# Patient Record
Sex: Male | Born: 1940
Health system: Southern US, Community
[De-identification: ages and names within clinical notes are randomized; demographics above are authoritative.]

## PROBLEM LIST (undated history)

## (undated) DIAGNOSIS — Z87442 Personal history of urinary calculi: Secondary | ICD-10-CM

## (undated) DIAGNOSIS — K219 Gastro-esophageal reflux disease without esophagitis: Secondary | ICD-10-CM

## (undated) DIAGNOSIS — E119 Type 2 diabetes mellitus without complications: Secondary | ICD-10-CM

## (undated) DIAGNOSIS — I2510 Atherosclerotic heart disease of native coronary artery without angina pectoris: Secondary | ICD-10-CM

## (undated) DIAGNOSIS — E785 Hyperlipidemia, unspecified: Secondary | ICD-10-CM

## (undated) DIAGNOSIS — D649 Anemia, unspecified: Secondary | ICD-10-CM

## (undated) DIAGNOSIS — J45909 Unspecified asthma, uncomplicated: Secondary | ICD-10-CM

## (undated) DIAGNOSIS — K5900 Constipation, unspecified: Secondary | ICD-10-CM

## (undated) DIAGNOSIS — I6529 Occlusion and stenosis of unspecified carotid artery: Secondary | ICD-10-CM

## (undated) DIAGNOSIS — N400 Enlarged prostate without lower urinary tract symptoms: Secondary | ICD-10-CM

## (undated) DIAGNOSIS — N183 Chronic kidney disease, stage 3 unspecified: Secondary | ICD-10-CM

## (undated) DIAGNOSIS — I10 Essential (primary) hypertension: Secondary | ICD-10-CM

## (undated) DIAGNOSIS — T7840XA Allergy, unspecified, initial encounter: Secondary | ICD-10-CM

## (undated) DIAGNOSIS — I5189 Other ill-defined heart diseases: Secondary | ICD-10-CM

## (undated) HISTORY — DX: Constipation, unspecified: K59.00

## (undated) HISTORY — DX: Other ill-defined heart diseases: I51.89

## (undated) HISTORY — DX: Anemia, unspecified: D64.9

## (undated) HISTORY — DX: Unspecified asthma, uncomplicated: J45.909

## (undated) HISTORY — DX: Chronic kidney disease, stage 3 unspecified: N18.30

## (undated) HISTORY — DX: Benign prostatic hyperplasia without lower urinary tract symptoms: N40.0

## (undated) HISTORY — DX: Chronic kidney disease, stage 3 (moderate): N18.3

## (undated) HISTORY — DX: Type 2 diabetes mellitus without complications: E11.9

## (undated) HISTORY — DX: Allergy, unspecified, initial encounter: T78.40XA

## (undated) HISTORY — DX: Hyperlipidemia, unspecified: E78.5

## (undated) HISTORY — DX: Atherosclerotic heart disease of native coronary artery without angina pectoris: I25.10

## (undated) HISTORY — DX: Essential (primary) hypertension: I10

## (undated) HISTORY — DX: Occlusion and stenosis of unspecified carotid artery: I65.29

## (undated) HISTORY — DX: Gastro-esophageal reflux disease without esophagitis: K21.9

---

## 1961-06-04 HISTORY — PX: VASECTOMY: SHX75

## 1993-09-22 HISTORY — PX: ANGIOPLASTY: SHX39

## 1998-06-04 HISTORY — PX: BACK SURGERY: SHX140

## 1998-11-28 ENCOUNTER — Ambulatory Visit (HOSPITAL_COMMUNITY): Admission: AD | Admit: 1998-11-28 | Discharge: 1998-11-28 | Payer: Self-pay | Admitting: Cardiovascular Disease

## 1998-11-28 HISTORY — PX: CARDIAC CATHETERIZATION: SHX172

## 1999-05-10 ENCOUNTER — Ambulatory Visit (HOSPITAL_COMMUNITY): Admission: RE | Admit: 1999-05-10 | Discharge: 1999-05-10 | Payer: Self-pay | Admitting: Neurosurgery

## 1999-05-10 ENCOUNTER — Encounter: Payer: Self-pay | Admitting: Neurosurgery

## 1999-05-24 ENCOUNTER — Encounter: Payer: Self-pay | Admitting: Neurosurgery

## 1999-05-24 ENCOUNTER — Ambulatory Visit (HOSPITAL_COMMUNITY): Admission: RE | Admit: 1999-05-24 | Discharge: 1999-05-24 | Payer: Self-pay | Admitting: Neurosurgery

## 1999-06-07 ENCOUNTER — Ambulatory Visit (HOSPITAL_COMMUNITY): Admission: RE | Admit: 1999-06-07 | Discharge: 1999-06-08 | Payer: Self-pay | Admitting: Neurosurgery

## 1999-06-07 ENCOUNTER — Encounter: Payer: Self-pay | Admitting: Neurosurgery

## 1999-06-15 ENCOUNTER — Encounter: Payer: Self-pay | Admitting: Neurosurgery

## 1999-06-16 ENCOUNTER — Encounter: Payer: Self-pay | Admitting: Neurosurgery

## 1999-06-16 ENCOUNTER — Inpatient Hospital Stay (HOSPITAL_COMMUNITY): Admission: RE | Admit: 1999-06-16 | Discharge: 1999-06-17 | Payer: Self-pay | Admitting: Neurosurgery

## 1999-07-17 ENCOUNTER — Ambulatory Visit (HOSPITAL_COMMUNITY): Admission: RE | Admit: 1999-07-17 | Discharge: 1999-07-17 | Payer: Self-pay | Admitting: Neurosurgery

## 1999-07-17 ENCOUNTER — Encounter: Payer: Self-pay | Admitting: Neurosurgery

## 1999-09-25 ENCOUNTER — Ambulatory Visit (HOSPITAL_COMMUNITY): Admission: RE | Admit: 1999-09-25 | Discharge: 1999-09-25 | Payer: Self-pay | Admitting: Neurosurgery

## 1999-09-25 ENCOUNTER — Encounter: Payer: Self-pay | Admitting: Neurosurgery

## 2004-04-12 ENCOUNTER — Ambulatory Visit: Payer: Self-pay | Admitting: Pulmonary Disease

## 2004-04-19 ENCOUNTER — Ambulatory Visit: Payer: Self-pay | Admitting: Pulmonary Disease

## 2004-08-03 ENCOUNTER — Ambulatory Visit: Payer: Self-pay | Admitting: Internal Medicine

## 2006-04-10 ENCOUNTER — Ambulatory Visit: Payer: Self-pay | Admitting: Pulmonary Disease

## 2007-04-03 ENCOUNTER — Ambulatory Visit: Payer: Self-pay | Admitting: Pulmonary Disease

## 2007-09-04 ENCOUNTER — Encounter: Payer: Self-pay | Admitting: Pulmonary Disease

## 2007-09-22 ENCOUNTER — Encounter: Payer: Self-pay | Admitting: Pulmonary Disease

## 2007-10-01 ENCOUNTER — Encounter: Payer: Self-pay | Admitting: Pulmonary Disease

## 2007-11-28 DIAGNOSIS — M810 Age-related osteoporosis without current pathological fracture: Secondary | ICD-10-CM | POA: Insufficient documentation

## 2007-11-28 DIAGNOSIS — K219 Gastro-esophageal reflux disease without esophagitis: Secondary | ICD-10-CM | POA: Insufficient documentation

## 2007-11-28 DIAGNOSIS — E785 Hyperlipidemia, unspecified: Secondary | ICD-10-CM

## 2007-11-28 DIAGNOSIS — Z794 Long term (current) use of insulin: Secondary | ICD-10-CM | POA: Insufficient documentation

## 2007-11-28 DIAGNOSIS — I2510 Atherosclerotic heart disease of native coronary artery without angina pectoris: Secondary | ICD-10-CM | POA: Insufficient documentation

## 2007-11-28 DIAGNOSIS — E119 Type 2 diabetes mellitus without complications: Secondary | ICD-10-CM | POA: Insufficient documentation

## 2007-11-28 DIAGNOSIS — E1169 Type 2 diabetes mellitus with other specified complication: Secondary | ICD-10-CM | POA: Insufficient documentation

## 2007-11-28 DIAGNOSIS — M1990 Unspecified osteoarthritis, unspecified site: Secondary | ICD-10-CM

## 2007-12-01 ENCOUNTER — Ambulatory Visit: Payer: Self-pay | Admitting: Pulmonary Disease

## 2007-12-01 DIAGNOSIS — I779 Disorder of arteries and arterioles, unspecified: Secondary | ICD-10-CM | POA: Insufficient documentation

## 2007-12-01 DIAGNOSIS — I739 Peripheral vascular disease, unspecified: Secondary | ICD-10-CM

## 2007-12-01 DIAGNOSIS — N400 Enlarged prostate without lower urinary tract symptoms: Secondary | ICD-10-CM | POA: Insufficient documentation

## 2007-12-01 DIAGNOSIS — M5137 Other intervertebral disc degeneration, lumbosacral region: Secondary | ICD-10-CM | POA: Insufficient documentation

## 2007-12-01 DIAGNOSIS — M51379 Other intervertebral disc degeneration, lumbosacral region without mention of lumbar back pain or lower extremity pain: Secondary | ICD-10-CM | POA: Insufficient documentation

## 2007-12-01 DIAGNOSIS — I10 Essential (primary) hypertension: Secondary | ICD-10-CM | POA: Insufficient documentation

## 2007-12-01 DIAGNOSIS — IMO0002 Reserved for concepts with insufficient information to code with codable children: Secondary | ICD-10-CM

## 2007-12-07 LAB — CONVERTED CEMR LAB
BUN: 15 mg/dL (ref 6–23)
CO2: 31 meq/L (ref 19–32)
Calcium: 9.1 mg/dL (ref 8.4–10.5)
Chloride: 103 meq/L (ref 96–112)
Creatinine, Ser: 0.9 mg/dL (ref 0.4–1.5)
Creatinine,U: 113.2 mg/dL
GFR calc Af Amer: 108 mL/min
GFR calc non Af Amer: 89 mL/min
Glucose, Bld: 245 mg/dL — ABNORMAL HIGH (ref 70–99)
Hgb A1c MFr Bld: 9 % — ABNORMAL HIGH (ref 4.6–6.0)
Microalb Creat Ratio: 1.8 mg/g (ref 0.0–30.0)
Microalb, Ur: 0.2 mg/dL (ref 0.0–1.9)
Potassium: 4.4 meq/L (ref 3.5–5.1)
Sodium: 139 meq/L (ref 135–145)

## 2007-12-15 ENCOUNTER — Ambulatory Visit: Payer: Self-pay | Admitting: Endocrinology

## 2007-12-23 ENCOUNTER — Telehealth (INDEPENDENT_AMBULATORY_CARE_PROVIDER_SITE_OTHER): Payer: Self-pay | Admitting: *Deleted

## 2008-01-19 ENCOUNTER — Telehealth (INDEPENDENT_AMBULATORY_CARE_PROVIDER_SITE_OTHER): Payer: Self-pay | Admitting: *Deleted

## 2008-02-20 ENCOUNTER — Telehealth (INDEPENDENT_AMBULATORY_CARE_PROVIDER_SITE_OTHER): Payer: Self-pay | Admitting: *Deleted

## 2008-03-03 ENCOUNTER — Telehealth (INDEPENDENT_AMBULATORY_CARE_PROVIDER_SITE_OTHER): Payer: Self-pay | Admitting: *Deleted

## 2008-03-15 ENCOUNTER — Ambulatory Visit: Payer: Self-pay | Admitting: Endocrinology

## 2008-03-16 LAB — CONVERTED CEMR LAB: Hgb A1c MFr Bld: 8.1 % — ABNORMAL HIGH (ref 4.6–6.0)

## 2008-03-22 ENCOUNTER — Ambulatory Visit: Payer: Self-pay | Admitting: Endocrinology

## 2008-05-25 ENCOUNTER — Telehealth (INDEPENDENT_AMBULATORY_CARE_PROVIDER_SITE_OTHER): Payer: Self-pay | Admitting: *Deleted

## 2008-06-04 HISTORY — PX: NASAL SINUS SURGERY: SHX719

## 2008-08-11 ENCOUNTER — Encounter: Payer: Self-pay | Admitting: Pulmonary Disease

## 2008-08-12 ENCOUNTER — Encounter: Payer: Self-pay | Admitting: Pulmonary Disease

## 2008-08-26 ENCOUNTER — Encounter: Payer: Self-pay | Admitting: Pulmonary Disease

## 2009-08-18 ENCOUNTER — Encounter: Payer: Self-pay | Admitting: Pulmonary Disease

## 2009-08-24 ENCOUNTER — Encounter: Payer: Self-pay | Admitting: Pulmonary Disease

## 2009-09-01 ENCOUNTER — Encounter: Payer: Self-pay | Admitting: Pulmonary Disease

## 2009-09-19 ENCOUNTER — Encounter: Payer: Self-pay | Admitting: Pulmonary Disease

## 2009-09-19 HISTORY — PX: NM MYOCAR PERF WALL MOTION: HXRAD629

## 2009-10-26 ENCOUNTER — Ambulatory Visit: Payer: Self-pay | Admitting: Pulmonary Disease

## 2010-07-04 NOTE — Progress Notes (Signed)
Summary: diabetic   Phone Note Call from Patient   Caller: [phone] Call For: dr ellsion Summary of Call: per [facility] call need a rx  for diabetic shoes . Initial call taken by: Nonah Mattes,  May 25, 2008 8:42 AM  Follow-up for Phone Call        I dont call them in.  normally a form from the place he intends to get them from is sent for [place] to fill out Follow-up by: Biagio Borg MD,  May 25, 2008 11:14 AM  Additional Follow-up for Phone Call Additional follow up Details #1::        there are specific criteria medicare uses.  i am happy to review at your next appt Additional Follow-up by: Donavan Foil MD,  May 25, 2008 10:01 PM    Additional Follow-up for Phone Call Additional follow up Details #2::    per [facility] states his  appt in feb , does he still have to wait for the rx for diabetic shoes ,need the rx before then Follow-up by: Nonah Mattes,  May 26, 2008 11:32 AM  Additional Follow-up for Phone Call Additional follow up Details #3:: Details for Additional Follow-up Action Taken: i have reviewed the chart. based on the information we have, medicare says you do not qualify Additional Follow-up by: Donavan Foil MD,  May 26, 2008 11:49 AM  per [place] call states he has been  getting this for 3 years , and he going to find another md Additional Follow-up by:  Nonah Mattes,  May 31, 2008 04 :13 PM

## 2010-07-04 NOTE — Assessment & Plan Note (Signed)
Summary: rov/fasting/apc   Primary Care Provider:  Noralee Space MD  CC:  23 month ROV & review of mult medical problems....  History of Present Illness: 70 y/o WM here for a follow up visit as he would like me to refill meds for him... he has multiple medical problems as noted below...   ~  Jun09:  he is followed regularly by DrWeintraub & [facility], we have a note from 09/04/07 and labs from 09/22/07... he is also followed at [facility]- or at least he was until recently- as he has been part of a drug study for the last 5 years that has involved his diabetes... they have had him regularly on Metformin 1000mg Bid and Glimepiride 4mg Bid as well... he has no clue what the study entailed, he does not know [name]'s, and there has apparently been no effort to titrate his meds, add additional agent, or add insulin to his regimen... we did refer him to the endocrinologists at [facility] in 2004 and he was seen by DrCalles-Escandon at that time and apparently referred to the ACCORD trial team... MrPlaster asked about seeing a diabetic specialist here- and I offered to refer him to [place]- but he states that he may consider another drug trial at [facility] and just wants refill + labs today...   ~  Oct 26, 2009:  he returns after a 65yr hiatus- still followed by [facility] w/ recent [place] (see below)... they have him on ASA, Metoprolol, Lisinopril, Dyazide, Zocor, Fish Oil... he is now under the care of DrKerr for Endocrine/ DM on Metformin, Glimepiride, Byetta...  he has had labs from both DrWeintraub & DrKerr... he sees a Dealer in [place], [place] copy w/ PSA's annually... as his medical needs are met by his numerous specialists it is suggested that he continue seeing these physicians regularly & f/u w/ [place] Prn...   Current Problem List:  HYPERTENSION (ICD-401.9) - on METOPROLOL XL 25mg - 1/2tab/d, LISINOPRIL 20mg /d, DYAZIDE- 1/2tabMWF (meds filled & adjusted by DrWeintraub)...  BP= 146/64, taking meds regularly, tolerating  well... denies HA, fatigue, visual changes, CP, palipit, dizziness, syncope, dyspnea, edema, etc...  ATHEROSCLEROTIC HEART DISEASE (ICD-414.00) - s/p DMI 1995 w/ PTCA by DrWeintraub... followed regularly on above meds + ASA 81mg /d, Flaxseed oil, Vitamins... last cath= 2000, and Nuclear Study in 2006- see DrWeintraubs note for details...  ~  2DEcho 4/11 was WNL- normal LVF, normal valves...  ~  Myoview 4/11 showed inferobasilar scar, no ischemia, EF=60%...  CEREBROVASCULAR DISEASE (ICD-437.9) - DrWeintraub has done his CDopplers w/ report of a 50-69% left ICA narrowing... we don't have any f/u data from their office...  HYPERLIPIDEMIA (ICD-272.4) - on ZOCOR 40mg /d, FishOil, and FlaxSeedOil... he tells me that DrRW does his lasts regularly.  ~  [place] 4/09 by [facility] showed TChol 128, TG 71, HDL 48, LDL 68  ~  FLP 3/11 by DrKerr showed TChol 128, TG 47, HDL 54, LDL 61  DM (ICD-250.00) - he is followed by DrKerr for Endocrine> on METFORMIN 1000mg Bid & GLIMEPIRIDE 4mg Bid... previously also on Byetta but he tells me it was stopped for nausea... labs are done regularly by DrKerr...  ~  labs 4/09 by [facility] showed BS= 189, A1c= 8.9  ~  labs 3/11 by DrKerr showed BS= 112, A1c= 6.3  ~  Ophthalmology check 4/11 [facility]- DrGreven was neg- no retinopathy, +early cataracts noted.  GERD (ICD-530.81) - uses OTC meds as needed... he had a colonoscopy 3/05 by Demetra Shiner that was normal... routine f/u planned 33yrs.  ~  NOTE: 1 brother died from ?complications after colonoscopy/ surg w/ cardiac arrest.  BENIGN PROSTATIC HYPERTROPHY, MILD, HX OF (ICD-V13.8) - he is followed by a Urologist in [place], Dr Elnoria Howard, who does his PSA's etc...   DEGENERATIVE JOINT DISEASE (ICD-715.90) DEGENERATIVE DISC DISEASE (ICD-722.6) - s/p lumbar decompression and fusion surg in 2001 by DrPool... also has signif spondylosis and foraminal narrowing in the CSpine...   Allergies (verified): No Known Drug  Allergies  Comments:  Nurse/Medical Assistant: The patient's medications and allergies were reviewed with the patient and were updated in the Medication and Allergy Lists.  Past History:  Past Medical History:  HYPERTENSION (ICD-401.9) ATHEROSCLEROTIC HEART DISEASE (ICD-414.00) CEREBROVASCULAR DISEASE (ICD-437.9) HYPERLIPIDEMIA (ICD-272.4) DM (ICD-250.00) GERD (ICD-530.81) BENIGN PROSTATIC HYPERTROPHY, MILD, HX OF (ICD-V13.8) DEGENERATIVE JOINT DISEASE (ICD-715.90) DEGENERATIVE DISC DISEASE (ICD-722.6)  Past Surgical History: S/P lumbar decompression & fusion surgery by DrPool in 2001  Family History: Reviewed history from 12/15/2007 and no changes required. 1/11 sibs have dm  Social History: Reviewed history from 12/15/2007 and no changes required. divorced retired Pharmacist, community  Review of Systems  The patient denies anorexia, fever, weight loss, weight gain, vision loss, decreased hearing, hoarseness, chest pain, syncope, dyspnea on exertion, peripheral edema, prolonged cough, headaches, hemoptysis, abdominal pain, melena, hematochezia, severe indigestion/heartburn, hematuria, incontinence, muscle weakness, suspicious skin lesions, transient blindness, difficulty walking, depression, unusual weight change, abnormal bleeding, enlarged lymph nodes, and angioedema.    Vital Signs:  Patient profile:   70 year old male Height:      70 inches Weight:      179 pounds BMI:     25.78 O2 Sat:      97 % on Room air Temp:     97.8 degrees F oral Pulse rate:   60 / minute BP sitting:   146 / 64  (left arm) Cuff size:   regular  Vitals Entered By: Elita Boone CMA (Oct 26, 2009 8:57 AM)  O2 Sat at Rest %:  97 O2 Flow:  Room air CC: 23 month ROV & review of mult medical problems... Is Patient Diabetic? Yes Pain Assessment Patient in pain? no      Comments MEDS UPDATED TODAY--PT BROUGHT ALL OF HIS MEDS TODAY   Physical Exam  Additional Exam:  WD, WN, 70 y/o WF in  [place]... GENERAL:  Alert & oriented; pleasant & cooperative... HEENT:  [place]/AT, EOM-wnl, PERRLA, EACs-clear, TMs-wnl, NOSE-clear, THROAT-clear & wnl. NECK:  Supple w/ fairROM; no JVD; normal carotid impulses w/o bruits; no thyromegaly or nodules palpated; no lymphadenopathy. CHEST:  Clear to P & A; without wheezes/ rales/ or rhonchi. HEART:  Regular Rhythm; without murmurs/ rubs/ or gallops. ABDOMEN:  Soft & nontender; normal bowel sounds; no organomegaly or masses detected. EXT: without deformities, mild arthritic changes; no varicose veins/ venous insuffic/ or edema. NEURO:  CN's intact; no focal neuro deficits...  DERM:  No lesions noted; no rash etc...    Impression & Recommendations:  Problem # 1:  HYPERTENSION (ICD-401.9) Controlled on meds and followed regularly by drWeintraub... His updated medication list for this problem includes:    Toprol Xl 25 Mg Tb24 (Metoprolol succinate) .Marland Kitchen... Take 1/2 by mouth once daily    Lisinopril 20 Mg Tabs (Lisinopril) .Marland Kitchen... Take 1 tablet by mouth once a day    Triamterene-hctz 37.5-25 Mg Tabs (Triamterene-hctz) .Marland Kitchen... 1/2 by mouth monday,wednesday and friday  Problem # 2:  ATHEROSCLEROTIC HEART DISEASE (ICD-414.00) S/P MI in 1995 & followed by DrWeintraub... recent [place] reviewed... stable. His updated  medication list for this problem includes:    Aspirin Adult Low Strength 81 Mg Tbec (Aspirin) .Marland Kitchen... Take 1 by mouth once daily    Toprol Xl 25 Mg Tb24 (Metoprolol succinate) .Marland Kitchen... Take 1/2 by mouth once daily    Lisinopril 20 Mg Tabs (Lisinopril) .Marland Kitchen... Take 1 tablet by mouth once a day    Triamterene-hctz 37.5-25 Mg Tabs (Triamterene-hctz) .Marland Kitchen... 1/2 by mouth monday,wednesday and friday  Problem # 3:  CEREBROVASCULAR DISEASE (ICD-437.9) Followed by [facility]...  Problem # 4:  HYPERLIPIDEMIA (B2193296.4) Followed by [facility] & Endocrine... His updated medication list for this problem includes:    Zocor 40 Mg Tabs (Simvastatin) .Marland Kitchen... Take 1 by  mouth once daily  Problem # 5:  DM (ICD-250.00) Followed by DrKerr... The following medications were removed from the medication list:    Actos 30 Mg Tabs (Pioglitazone hcl) ..... Qd His updated medication list for this problem includes:    Aspirin Adult Low Strength 81 Mg Tbec (Aspirin) .Marland Kitchen... Take 1 by mouth once daily    Lisinopril 20 Mg Tabs (Lisinopril) .Marland Kitchen... Take 1 tablet by mouth once a day    Metformin Hcl 1000 Mg Tabs (Metformin hcl) .Marland Kitchen... Take one tablet by mouth two times a day    Amaryl 4 Mg Tabs (Glimepiride) .Marland Kitchen... Take one tablet by mouth two times a day  Problem # 6:  BENIGN PROSTATIC HYPERTROPHY, MILD, HX OF (ICD-V13.8) Followed by Urology... he tells me that they do his PSAs (we don't have records)...  Problem # 7:  Other medical problems as noted>>>  Complete Medication List: 1)  Aspirin Adult Low Strength 81 Mg Tbec (Aspirin) .... Take 1 by mouth once daily 2)  Toprol Xl 25 Mg Tb24 (Metoprolol succinate) .... Take 1/2 by mouth once daily 3)  Lisinopril 20 Mg Tabs (Lisinopril) .... Take 1 tablet by mouth once a day 4)  Triamterene-hctz 37.5-25 Mg Tabs (Triamterene-hctz) .... 1/2 by mouth monday,wednesday and friday 5)  Zocor 40 Mg Tabs (Simvastatin) .... Take 1 by mouth once daily 6)  Fish Oil 1000 Mg Caps (Omega-3 fatty acids) .... Take 1 tablet by mouth once a day 7)  Metformin Hcl 1000 Mg Tabs (Metformin hcl) .... Take one tablet by mouth two times a day 8)  Amaryl 4 Mg Tabs (Glimepiride) .... Take one tablet by mouth two times a day 9)  Cvs Vitamin E 400 Unit Caps (Vitamin e) .... Take 1 by mouth once daily 10)  Precision Ultra Lancet Misc (Lancets) .... Bid 11)  Precision Qid Test Strp (Glucose blood) .... Bid 12)  Lifesource Precision Scale Misc (Misc. devices) 13)  Flax Seed Oil 1000 Mg Caps (Flaxseed (linseed)) .... Take 1 tablet by mouth once a day 14)  Ra Iron 27 Mg Tabs (Ferrous sulfate) .... Take 1 tablet by mouth once a day  Patient Instructions: 1)   Today we updated your med list- see below.... 2)  Continue your current meds from DrWeintraub & DrKerr... 3)  Your follow up colonoscopy from [place] is due 3/12... 4)  Call if we can be of any service to you...   Immunization History:  Influenza Immunization History:    Influenza:  historical (03/23/2009)  Pneumovax Immunization History:    Pneumovax:  historical (03/23/2009)

## 2010-07-04 NOTE — Progress Notes (Signed)
Summary: need lab results  Phone Note Call from Patient   Caller: Patient Call For: nadel Summary of Call: calling for a copy of lab results to be mail to him  Follow-up for Phone Call        called and spoke with pt. pt is requstig most recent labs be sent to his home address. i verified correct address and informed him labs would be sent in the mail.  Valrie Hart LPN  July 21, [id number] PM

## 2010-07-04 NOTE — Letter (Signed)
Summary: [facility]   Imported By: Phillis Knack 09/06/2009 14:54:42  _____________________________________________________________________  External Attachment:    Type:   Image     Comment:   External Document

## 2010-07-04 NOTE — Assessment & Plan Note (Signed)
Summary: NEW ENDO CONSULT/ NEW ONSET DM/DR NADEL/NWS   Vital Signs:  Patient Profile:   70 Years Old Male Weight:      202.2 pounds Temp:     98.2 degrees F oral Pulse rate:   62 / minute BP sitting:   158 / 72  (right arm) Cuff size:   regular  Pt. in pain?   no  Vitals Entered By: Tomma Lightning (December 15, 2007 2:26 PM)                  Referred by:  NADEL PCP:  Noralee Space MD  Chief Complaint:  NEW ENDO/ DM.  History of Present Illness: dx'ed with dm with back surgery in [id number], complicated by cad.  never required insulin.  takes several oral agents.  cbg's are high-100's-200's. say his diet and exercise are good.   says he wants to avoid insulin "unless i have to take it." symptomatically, has 2 years of moderate fatigue, and slight associated numbness of the toes.    Current Allergies: No known allergies   Past Medical History:    Reviewed history from 12/01/2007 and no changes required:              HYPERTENSION (ICD-401.9)       ATHEROSCLEROTIC HEART DISEASE (ICD-414.00)       CEREBROVASCULAR DISEASE (ICD-437.9)       HYPERLIPIDEMIA (ICD-272.4)       DM (ICD-250.00)       GERD (ICD-530.81)       BENIGN PROSTATIC HYPERTROPHY, MILD, HX OF (ICD-V13.8)       DEGENERATIVE JOINT DISEASE (ICD-715.90)       DEGENERATIVE DISC DISEASE (ICD-722.6)          Family History:    1/11 sibs have dm  Social History:    Reviewed history and no changes required:       divorced       retired Pharmacist, community   Risk Factors:  Tobacco use:  never   Review of Systems  The patient denies nausea, vomiting, hypoglycemia, tremor, anxiety, weight loss, weight gain, chest pain, syncope, dyspnea on exertion, peripheral edema, depression, and suspicious skin lesions.         denies diarrhea   Physical Exam  General:     well developed, well nourished, in no acute distress Head:     head is normocephalic eyes: no scleral icterus no periorbital swelling perrl external ears  are normal nose normal externally mouth has no lesion, including normal tongue  Neck:     no masses, thyromegaly, or abnormal cervical nodes Lungs:     clear to auscultation.  no respiratory distress  Heart:     regular rate and rhythm, S1, S2 without murmurs, rubs, gallops, or clicks Abdomen:     abdomen is soft, nontender.  no hepatosplenomegaly.   not distended.  no hernia  Msk:     no deformity or scoliosis noted with normal posture and gait Pulses:     dorsalis pedis intact bilat.  no carotid bruit  Extremities:     no deformity.  no ulcer on the feet.  feet are of normal color and temp.  has few varicosities bilaterally. trace left pedal edema and trace right pedal edema.   Neurologic:     no focal deficits, CN II-XII grossly intact with normal coordination, muscle strength and tone sensation is intact to touch on the feet  Skin:     normal texture and  temp.  no rash.  not diaphoretic  Cervical Nodes:     no significant adenopathy Psych:     alert and cooperative; normal mood and affect; normal attention span and concentration    Impression & Recommendations:  Problem # 1:  DM (ICD-250.00)  Problem # 2:  fatigue possibly due to dm  Problem # 3:  numbness possibly due to dm  Medications Added to Medication List This Visit: 1)  Januvia 100 Mg Tabs (Sitagliptin phosphate) .... Qd 2)  Welchol 625 Mg Tabs (Colesevelam hcl) .... 6 qd   Patient Instructions: 1)  we discussed the importance of diet and exercise therapy and the risk of diabetes 2)  add januvia 100/d 3)  add welchol 6x625/d 4)  i told pt he will likely require insulin soon 5)  ret 3 mos with a1c prior 250.00   Prescriptions: JANUVIA 100 MG  TABS (SITAGLIPTIN PHOSPHATE) qd  #90 x 3   Entered and Authorized by:   Donavan Foil MD   Signed by:   Donavan Foil MD on 12/15/2007   Method used:   Print then Give to Patient   RxID:   [id number] WELCHOL 625 MG  TABS (COLESEVELAM HCL) 6 qd   #540 x 3   Entered and Authorized by:   Donavan Foil MD   Signed by:   Donavan Foil MD on 12/15/2007   Method used:   Print then Give to Patient   RxID:   [id number]  ]  Appended Document: NEW ENDO CONSULT/ NEW ONSET DM/DR NADEL/NWS Labs entered/LMB

## 2010-07-04 NOTE — Letter (Signed)
Summary: [facility]opathy [facility] Ocular [facility]  Ret[facility]opathy [facility] Ocular Eval/[facility]   Imported By: Phillis Knack 09/22/2008 11:01:10  _____________________________________________________________________  External Attachment:    Type:   Image     Comment:   External Document

## 2010-07-04 NOTE — Letter (Signed)
Summary: Ophthalmic eval/[facility]  Ophthalmic eval/[facility]   Imported By: Phillis Knack 10/04/2009 13:40:09  _____________________________________________________________________  External Attachment:    Type:   Image     Comment:   External Document

## 2010-07-07 NOTE — Letter (Signed)
Summary: [facility]   Imported By: Phillis Knack 08/24/2009 12:08:16  _____________________________________________________________________  External Attachment:    Type:   Image     Comment:   External Document

## 2010-10-20 NOTE — Op Note (Signed)
[facility]  Patient:    Justin Salinas                    MRN: [id number] Proc. Date: 06/16/99 Adm. Date:  [id number] Attending:  Abran Duke                           Operative Report  PREOPERATIVE DIAGNOSIS:  Grade 1 lumbar vertebrae-5/sacral vertebrae-1 lytic spondylolisthesis with radiculopathy.  POSTOPERATIVE DIAGNOSIS:  Grade 1 lumbar vertebrae-5/sacral vertebrae-1 lytic spondylolisthesis with radiculopathy.  OPERATION:  L5 Gill procedure with bilateral L5 and S1 foraminotomies. Bilateral L5-S1 microdiskectomy.  Posterior lumbar interbody fusion utilizing tangent threaded bone dowels and local autograft.  Posterior-lateral fusion with pedicle screw instrumentation and local autologous bone grafting.  SURGEON:  Earnie Larsson, M.D.  ASSISTANT:  Marc Morgans., M.D.  ANESTHESIA:  General endotracheal anesthesia.  INDICATION:  Justin Salinas is a 70 year old male with history of back and severe right lower extremity pain, paresthesias, and weakness consistent with a right-sided L5 radiculopathy which failed conservative management.  MRI scanning demonstrates evidence of bilateral defects in his pars interarticularis with a grade 1 lytic spondylolisthesis.  The patient has what appears to be a rightward L5-S1 disc herniation within the foramen causing compression of the right-sided S1 nerve root.  The patient has failed conservative management including epidural steroid injection.  The patient presents now for decompression and fusion for hopeful relief of his symptoms.  DESCRIPTION OF PROCEDURE:  The patient was taken to the operating room and placed on the table in a supine position.  After adequate level of anesthesia achieved, the patient is positioned prone on to a Wilson frame, appropriately padded. The patients lumbar region is shaved and prepped sterilely.  A 10 blade was used to make a linear skin incision  overlying the L4/5 and S1 levels.  This was carried down sharply in the midline.  Subperiosteal dissection was performed bilaterally exposing the lamina and facet joints of L4, L5, and S1. The transverse processes and ala of the sacrum were exposed at L5 and S1.  A deep self-retaining retractor was placed.  X-rays taken of the level was confirmed.  A decompressive laminectomy was then performed using high speed drill, Kerrison, and Leksell rongeurs to remove the entire lamina of L5.  The inferior facets of L5 were also completely removed.  The rudimentary inferior facets still attached to the pedicle of L5 were then dissected free and resected using Kerrison rongeurs in the method described by Dr. Gordy Levan.  This completed a Gill procedure.  Both L5 and S1 nerve roots were widely decompressed at this point.  Microscope was brought in the field and used for microdissection of the spinal canal and underlying disc space.  Starting first on the patients right side, the epidural venous plexus was coagulated and cut.  Thecal sac was mobilized and retracted towards the midline.  Disc space was incised with a 15 blade in a rectangular fashion.  A wide disc space clean out was achieved using pituitary rongeurs, upward angle pituitary rongeurs, and Ebstein curets.  A large foraminal disc herniation was encountered, resected, and completely removed using pituitary rongeurs.  Disc space was then distracted to 9 mm.  Attention was then placed on the contralateral side.  The left side was quite unusual in that the dura was completely adherent to the  posterior longitudinal ligament.  A dissection plate could not be  made. Generally, the thecal sac and nerve root was slowly mobilized.  A small dural laceration was made at this point.  No CSF leaks.  The dura was reapproximated with single 6-0  Prolene.  The disc space was then isolated and incised with a 15 blade in rectangular fashion.  A  wide disc space clean out was again achieved on this side.  The disc space was then reamed and cut with a small ______ medical tangent chisel.  The inner space was cleaned.  An 8 by 26 mm tangent threaded bone dowel was then impacted into place at the L5-S1 level.  The distractor was removed from the contralateral side. Intraoperative fluoroscopy revealed good positioning of the bone graft at the proper operative  level with normal alignment.  Attention was then placed to the contralateral side.  Once again, the thecal sac and S1 nerve root were mobilized and retracted towards the midline.  L5 nerve root was protected above.  The disc space was then reamed and then cut with a cutting chisel.  Morcellized autograft was placed along the medial border of the contralateral wedge.  An 8 by 26 mm tangent wedge was once again impacted into place.  This was confirmed to be of good position by intraoperative fluoroscopy.  All retractor systems were removed.  There was no evidence of injury to the thecal sac or nerve roots at this part of the procedure.  The wound was then copiously irrigated with antibiotic solution.  Preparation was made for placement of pedicle instrumentation.  Using intraoperative fluoroscopy, pedicles were isolated at L5 and S1 bilaterally.  Pedicles were then probed using a pedicle awl at L5 and S1 bilaterally.  Each pedicle awl hole was found to be solidly in bone using blunt probe.  Each awl hole was then tapped using a 5.25 m screw tap under fluoroscopic guidance.  Once again, awl holes were found to be solidly within the pedicle itself without any evidence of violation.  Then under fluoroscopic guidance a 6.75 by 45 mm variable headed STRS pedicle screws were placed bilaterally.  At S1, 35 mm by 6.75 variable headed screws were placed bilaterally.  Each screw were given a final tightening and found to be solidly within bone.  A small short segment of  titanium rod was then placed over the screw heads of L5 and S1.  Locking caps were engaged.  The constructs were placed under general compression.  Final images revealed good position of the pedicle instrumentation and interbody  bone grafts.  This was done on both the AP and lateral planes.  The wound was then irrigated one final time.  The transverse processes in ala and sacrum were then decorticated.  Morcellized bone graft was placed laterally in  typical posterolateral fusion manner.  The canal was examined.  There was no evidence of compression of the L5 or S1 nerve roots bilaterally.  There is no evidence of any current CSF leakage.  Tisseal fiber and glue sealant was then placed over the dural repair just proximal to the S1 root.  This was allowed to  set.  Gelfoam was placed over the laminectomy defect.  Retraction system was removed.  The wound was then closed in layers with Vicryl sutures.  Staples were applied o the surface.  There were no apparent complications other than the small dural laceration.  The patient tolerated the procedure well and went to the recovery room postoperatively. DD:  06/16/99 TD:  06/17/99 Job: [id number]

## 2010-12-20 ENCOUNTER — Ambulatory Visit: Payer: Self-pay | Admitting: Pulmonary Disease

## 2011-07-06 HISTORY — PX: OTHER SURGICAL HISTORY: SHX169

## 2011-08-22 DIAGNOSIS — E119 Type 2 diabetes mellitus without complications: Secondary | ICD-10-CM | POA: Insufficient documentation

## 2011-10-03 HISTORY — PX: OTHER SURGICAL HISTORY: SHX169

## 2012-01-11 DIAGNOSIS — H52209 Unspecified astigmatism, unspecified eye: Secondary | ICD-10-CM | POA: Insufficient documentation

## 2012-01-11 DIAGNOSIS — H5200 Hypermetropia, unspecified eye: Secondary | ICD-10-CM | POA: Insufficient documentation

## 2012-01-11 DIAGNOSIS — H524 Presbyopia: Secondary | ICD-10-CM | POA: Insufficient documentation

## 2012-09-18 ENCOUNTER — Other Ambulatory Visit (HOSPITAL_COMMUNITY): Payer: Self-pay | Admitting: Cardiovascular Disease

## 2012-09-18 DIAGNOSIS — R0989 Other specified symptoms and signs involving the circulatory and respiratory systems: Secondary | ICD-10-CM

## 2012-09-18 DIAGNOSIS — R011 Cardiac murmur, unspecified: Secondary | ICD-10-CM

## 2012-09-18 DIAGNOSIS — I701 Atherosclerosis of renal artery: Secondary | ICD-10-CM

## 2012-10-20 ENCOUNTER — Ambulatory Visit (HOSPITAL_COMMUNITY)
Admission: RE | Admit: 2012-10-20 | Discharge: 2012-10-20 | Disposition: A | Discharge status: Home / Self Care | Payer: Medicare Other | Source: Ambulatory Visit | Attending: Cardiovascular Disease | Admitting: Cardiovascular Disease

## 2012-10-20 DIAGNOSIS — R0989 Other specified symptoms and signs involving the circulatory and respiratory systems: Secondary | ICD-10-CM

## 2012-10-20 DIAGNOSIS — R011 Cardiac murmur, unspecified: Secondary | ICD-10-CM | POA: Insufficient documentation

## 2012-10-20 DIAGNOSIS — I701 Atherosclerosis of renal artery: Secondary | ICD-10-CM | POA: Insufficient documentation

## 2012-10-20 DIAGNOSIS — I379 Nonrheumatic pulmonary valve disorder, unspecified: Secondary | ICD-10-CM | POA: Insufficient documentation

## 2012-10-20 DIAGNOSIS — I517 Cardiomegaly: Secondary | ICD-10-CM | POA: Insufficient documentation

## 2012-10-20 DIAGNOSIS — E119 Type 2 diabetes mellitus without complications: Secondary | ICD-10-CM | POA: Insufficient documentation

## 2012-10-20 DIAGNOSIS — I2510 Atherosclerotic heart disease of native coronary artery without angina pectoris: Secondary | ICD-10-CM | POA: Insufficient documentation

## 2012-10-20 DIAGNOSIS — I10 Essential (primary) hypertension: Secondary | ICD-10-CM | POA: Insufficient documentation

## 2012-10-20 DIAGNOSIS — I059 Rheumatic mitral valve disease, unspecified: Secondary | ICD-10-CM | POA: Insufficient documentation

## 2012-10-20 HISTORY — PX: TRANSTHORACIC ECHOCARDIOGRAM: SHX275

## 2012-10-20 NOTE — Progress Notes (Signed)
2D Echo Performed 10/20/2012    Justin Salinas, RCS

## 2012-10-20 NOTE — Progress Notes (Signed)
Carotid Duplex Completed. Baptiste Littler D  

## 2012-10-20 NOTE — Progress Notes (Signed)
Renal Artery Duplex Completed. [name]

## 2012-11-19 ENCOUNTER — Telehealth: Payer: Self-pay | Admitting: Cardiovascular Disease

## 2012-11-19 NOTE — Telephone Encounter (Signed)
Message forwarded to [facility]. Tressie Stalker, LPN to discuss w/ Dr. Rollene Fare.  This note and paper chart# [id number] placed on Dr. Lowella Fairy cart.

## 2012-11-19 NOTE — Telephone Encounter (Signed)
Pharmacist said you had not got back to them-been waiting since the week-end-please call his heart medicine in-she did not know which one-didn;t know the name of it-Please call to [phone]!

## 2012-11-19 NOTE — Telephone Encounter (Signed)
Called pt. Back; family member stated the call here today was a mistake that they meant to call Dr. Cindra Eves office.

## 2012-11-19 NOTE — Telephone Encounter (Signed)
Pt called back-said the name of his medicine is Metformin!Thats the way she spelled it!

## 2013-02-12 ENCOUNTER — Ambulatory Visit (INDEPENDENT_AMBULATORY_CARE_PROVIDER_SITE_OTHER): Payer: Medicare Other | Admitting: Cardiology

## 2013-02-12 ENCOUNTER — Encounter: Payer: Self-pay | Admitting: Cardiology

## 2013-02-12 VITALS — BP 90/60 | Ht 70.0 in | Wt 171.8 lb

## 2013-02-12 DIAGNOSIS — I2510 Atherosclerotic heart disease of native coronary artery without angina pectoris: Secondary | ICD-10-CM

## 2013-02-12 DIAGNOSIS — R079 Chest pain, unspecified: Secondary | ICD-10-CM

## 2013-02-12 DIAGNOSIS — I10 Essential (primary) hypertension: Secondary | ICD-10-CM

## 2013-02-12 DIAGNOSIS — N183 Chronic kidney disease, stage 3 unspecified: Secondary | ICD-10-CM

## 2013-02-12 MED ORDER — NITROGLYCERIN 0.4 MG SL SUBL: 0.4000 mg | SUBLINGUAL_TABLET | SUBLINGUAL | Status: DC | PRN

## 2013-02-12 MED ORDER — LISINOPRIL 20 MG PO TABS: 20.0000 mg | ORAL_TABLET | Freq: Every day | ORAL | Status: DC

## 2013-02-12 MED ORDER — OMEPRAZOLE 20 MG PO CPDR: 20.0000 mg | DELAYED_RELEASE_CAPSULE | Freq: Every day | ORAL | Status: DC

## 2013-02-12 NOTE — Assessment & Plan Note (Signed)
B/P is low today- 90 systolic. Nl renals at cath in 2000 but abnormal renal dopplers 5/14

## 2013-02-12 NOTE — Assessment & Plan Note (Signed)
SCr 1.47 12/13

## 2013-02-12 NOTE — Patient Instructions (Signed)
Your physician has requested that you have en exercise stress myoview. For further information please visit [email or web address]. Please follow instruction sheet, as given.   

## 2013-02-12 NOTE — Assessment & Plan Note (Signed)
"  Indigestion" after walking and eating breakfast

## 2013-02-12 NOTE — Assessment & Plan Note (Signed)
Low risk Myoview 4/11

## 2013-02-12 NOTE — Progress Notes (Signed)
02/12/2013 Justin Salinas   [birth year]  [id number]  Primary Physicia Salinas, Justin Graff, MD Primary Cardiologist: Dr Justin Salinas  HPI:  72 y/o with a history of RCA PCI in '95. This was later noted  Be occluded in 2000. At that time he also had moderate Lt system disease but he has done well on medical Rx. He walks for exercise for about an hour a day. Today he went for his walk, came home for breakfast, and then had "indigestion". He took an Copywriter, advertising with relief. His wife was concerned and took him to his primary care provider who then sent him on to [place]. He is pain free now. He denies any jaw pain, arm pain, or diaphoresis.   Current Outpatient Prescriptions  Medication Sig Dispense Refill  . aspirin 81 MG tablet Take 81 mg by mouth daily.      . Flaxseed, Linseed, (FLAX SEEDS PO) Take 1,200 mg by mouth.      Marland Kitchen glimepiride (AMARYL) 4 MG tablet Take 4 mg by mouth 2 (two) times daily.      . metFORMIN (GLUCOPHAGE) 1000 MG tablet Take 1,000 mg by mouth 2 (two) times daily with a meal.      . Multiple Vitamins-Minerals (CENTRUM SILVER ADULT 50+ PO) Take by mouth.      . Omega-3 Fatty Acids (FISH OIL) 1000 MG CAPS Take by mouth.      . simvastatin (ZOCOR) 40 MG tablet Take 40 mg by mouth every evening.      . triamterene-hydrochlorothiazide (DYAZIDE) 37.5-25 MG per capsule Take by mouth every morning. Takes 1/2 tablet on mon, wed, fri      . vitamin B-12 (CYANOCOBALAMIN) 100 MCG tablet Take 50 mcg by mouth daily.       No current facility-administered medications for this visit.    No Known Allergies  History   Social History  . Marital Status: Married    Spouse Name: N/A    Number of Children: N/A  . Years of Education: N/A   Occupational History  . Not on file.   Social History Main Topics  . Smoking status: Former Smoker    Types: Cigarettes  . Smokeless tobacco: Former Systems developer    Types: Snuff, Chew     Comment: quit about 40 years  . Alcohol Use: 1.5 oz/week    3 drink(s)  per week  . Drug Use: Not on file  . Sexual Activity: Not on file   Other Topics Concern  . Not on file   Social History Narrative  . No narrative on file     Review of Systems: General: negative for chills, fever, night sweats or weight changes.  Cardiovascular: negative for chest pain, dyspnea on exertion, edema, orthopnea, palpitations, paroxysmal nocturnal dyspnea or shortness of breath Dermatological: negative for rash Respiratory: negative for cough or wheezing Urologic: negative for hematuria Abdominal: negative for nausea, vomiting, diarrhea, bright red blood per rectum, melena, or hematemesis Neurologic: negative for visual changes, syncope, or dizziness All other systems reviewed and are otherwise negative except as noted above.    Blood pressure 90/60, height 5\' 10"  (1.778 m), weight 171 lb 12.8 oz (77.928 kg).  General appearance: alert, cooperative and no distress Neck: no JVD and bilat carotid bruits Lungs: clear to auscultation bilaterally Heart: regular rate and rhythm  EKG not done  ASSESSMENT AND PLAN:   Chest pain with moderate risk for cardiac etiology "Indigestion" after walking and eating breakfast  HYPERTENSION B/P is low today-  90 systolic. Nl renals at cath in 2000 but abnormal renal dopplers 5/14  Chronic renal insufficiency, stage III (moderate) SCr 1.47 12/13  CAD, RCA PCI '95, occluded RCA 2000 with moderate Lt system disease. Myovie low risk 4/11 Low risk Myoview 4/11    PLAN  I wanted to set  set Mr Justin Salinas up for an exercise Myoview. Unfortunately he had an EKG at Dr Inocente Salles office that we could not get faxed. The pt did not want another one and got upset and left.  I suggested he decrease his Prinivil to 20 mg a day from 60 mg a day and start a PPI and I gave him an Rx for NTG prn. It interesting to note that he has bilat 60-99% renal artery stenosis by doppler but reportedly normal renal arteries at cath in 2000.  [facility]  KPA-C 02/12/2013 3:39 PM

## 2013-02-13 ENCOUNTER — Encounter: Payer: Self-pay | Admitting: Cardiovascular Disease

## 2013-02-20 ENCOUNTER — Telehealth (HOSPITAL_COMMUNITY): Payer: Self-pay | Admitting: *Deleted

## 2013-02-24 ENCOUNTER — Inpatient Hospital Stay (HOSPITAL_COMMUNITY): Admission: RE | Admit: 2013-02-24 | Payer: Medicare Other | Source: Ambulatory Visit

## 2013-03-02 ENCOUNTER — Telehealth (HOSPITAL_COMMUNITY): Payer: Self-pay | Admitting: *Deleted

## 2013-03-06 ENCOUNTER — Telehealth (HOSPITAL_COMMUNITY): Payer: Self-pay | Admitting: *Deleted

## 2013-04-10 ENCOUNTER — Other Ambulatory Visit (HOSPITAL_COMMUNITY): Payer: Self-pay | Admitting: Cardiology

## 2013-04-10 DIAGNOSIS — I701 Atherosclerosis of renal artery: Secondary | ICD-10-CM

## 2013-04-13 ENCOUNTER — Other Ambulatory Visit: Payer: Self-pay | Admitting: *Deleted

## 2013-04-13 MED ORDER — SIMVASTATIN 40 MG PO TABS: 40.0000 mg | ORAL_TABLET | Freq: Every evening | ORAL | Status: DC

## 2013-04-27 ENCOUNTER — Encounter (HOSPITAL_COMMUNITY): Payer: Medicare Other

## 2013-06-15 ENCOUNTER — Encounter: Payer: Self-pay | Admitting: Gastroenterology

## 2013-08-05 ENCOUNTER — Encounter: Payer: Self-pay | Admitting: Gastroenterology

## 2013-09-01 ENCOUNTER — Encounter: Payer: Self-pay | Admitting: Gastroenterology

## 2013-09-09 ENCOUNTER — Ambulatory Visit (AMBULATORY_SURGERY_CENTER): Payer: Self-pay | Admitting: *Deleted

## 2013-09-09 VITALS — Ht 71.0 in | Wt 177.2 lb

## 2013-09-09 DIAGNOSIS — Z1211 Encounter for screening for malignant neoplasm of colon: Secondary | ICD-10-CM

## 2013-09-09 MED ORDER — NA SULFATE-K SULFATE-MG SULF 17.5-3.13-1.6 GM/177ML PO SOLN: 1.0000 | Freq: Once | ORAL | Status: DC

## 2013-09-09 NOTE — Progress Notes (Signed)
No allergies to eggs or soy. No problems with anesthesia.  Pt given Emmi instructions for colonoscopy  

## 2013-09-23 ENCOUNTER — Encounter: Payer: Medicare Other | Admitting: Gastroenterology

## 2013-09-23 ENCOUNTER — Ambulatory Visit: Payer: Medicare PPO | Attending: Otolaryngology | Admitting: Audiology

## 2013-09-23 DIAGNOSIS — H9319 Tinnitus, unspecified ear: Secondary | ICD-10-CM | POA: Diagnosis not present

## 2013-09-23 DIAGNOSIS — H9192 Unspecified hearing loss, left ear: Secondary | ICD-10-CM

## 2013-09-23 DIAGNOSIS — H918X9 Other specified hearing loss, unspecified ear: Secondary | ICD-10-CM | POA: Diagnosis not present

## 2013-09-23 DIAGNOSIS — H9191 Unspecified hearing loss, right ear: Secondary | ICD-10-CM

## 2013-09-23 DIAGNOSIS — H9311 Tinnitus, right ear: Secondary | ICD-10-CM

## 2013-09-23 NOTE — Procedures (Signed)
Outpatient Rehabilitation and [facility] [address] [place], [place] [address] [phone]  AUDIOLOGICAL EVALUATION  Name: Donna Silverman Rosado DOB:  September 30, 1940 MRN:  [id number]                                 Diagnosis: Sudden hearing loss, tinnitus, vertigo Date: 09/23/2013    Referent: Abigail Miyamoto, MD  HISTORY: Bethann Punches Duhe, age 73 y.o. years, was seen for an audiological evaluation.   He states that he had a change in right ear hearing with intermittent tinnitus and vertigo about "6 weeks ago".  Mr. Popowski states the "tinnitus sounds like an ocean".  He reports that with the feeling of being "off-balance, lightheaded or spinning" that he "falls to the right, left and forward" and "there is no one direction".  Bethann Punches Netter also reports feeling of "pressure in the ears" as well as "feeling that sinus drainage" contributing to the vertigo. Mr. Henriques states that an "MRI has been scheduled for this Friday".          EVALUATION: Pure tone air and tone conduction was completed using conventional audiometry with inserts.Please see the audiogram and the conclusion for further details. Speech detection is 110 dBHL in the right ear and 45 dBHL in the left ear using speech noise, but Mr. Hogston was unable to repeat words at this level.    Word recognition is 0% at 110 dBHL in the right and 92% at 85dBHL in the left using recorded NU-6 word lists in quiet. In minimal background noise with +5dB signal to noise ratio word recogntion is  32% in the left ear. Otoscopic inspection reveals clear ear canals with visible tympanic membranes.    Tympanometry showed in the right ear had slightly shallow tympanic membrane movement (Type As)  and in the left ear was within normal limits (Type A).  Ipsilateral acoustic reflexes are absent on the right and elevated to absent on the left.  Tinnitus matching is approximately 88dBHL in the right ear using speech noise.   CONCLUSION:    Bethann Punches Greff reported two thresholds at [place] in the severe profound range (probably cross over because of the masking dilemma) but for the rest of the frequencies he had no response. Hearing thresholds on the left side showed a moderate low frequency hearing loss improving to normal at [place] and dropping to a moderately severe high frequency hearing loss.  The left ear hearing loss appears sensorineural.  In the left ear Mr. Lehan has excellent word recognition at very loud levels, but in minimal background noise his word recognition drops to poor.    Ilian is a candidate for amplification on the left side or possible a CROS aid, therefore a hearing aid evaluation is recommended.   RECOMMENDATIONS: 1.   Follow-up with Dr. Ernesto Rutherford ENT. 2.   A hearing aid evaluation once medical clearance is obtained from Dr. Ernesto Rutherford.  Please be aware that there may be county funding for one hearing aid depending on financial need.  Equipment Distribution Services in [place] may help with obtaining one hearing aid or one captioned telephone if financial qualifications are met.  Please contact Miss Sharlett Iles at [phone] ext [phone]. 3.   To minimize the adverse effects of tinnitus 1) avoid quiet  2) use noise maskers at home such as a sound machine, quiet music, a fan or other background noise at  a volume just loud enough to mask the high pitched tinnitus. 3) If the tinnitus becomes more bothersome, adversely affecting your sleep or concentration, contact your physician,  seek additional medical help by an ENT for further treatment of your tinnitus.    Deborah L. Heide Spark, Au.D., CCC-A Doctor of Audiology 09/23/2013  cc: Abigail Miyamoto, MD

## 2013-09-23 NOTE — Patient Instructions (Signed)
    [employer] in [place] may help with obtaining one hearing aid or one captioned telephone if financial qualifications are met.  Please contact Miss Sharlett Iles at [phone] ext [phone].  List of Providers for EDS Hearing Aid Distribution [facility] December 03, 2011 - December 01, 2013. The following hearing aid companies have contracted with [place] to fit hearing aids for eligible applicants. Representatives of these companies are not [facility] employees. Inclusion on this list means the company agrees to the terms of contract pricing and inclusion is not an endorsement of their services over those who are not contracted with the division. An applicant may choose any provider listed from your region to assist in obtaining hearing aids through this service. If problems arise during this process, please contact the [place] that provided the application.  [facility] Vendor List 1 Updated; September 29, 2012  [place], [place] [facility]  [place] [phone] & [place] [address] [phone] [place] [address] [place], [place] [facility] [place], [place] [facility] ENT [place], [place] [facility] Hearing Doctors [place], [place], [place] [facility]/A [facility] [address] [place], [place] [facility] ENT  [facility]  [phone] and [place]  [phone] [facility] [phone] and [place] [phone] [place]  [phone] [facility] [phone] or [address] ENT [place], [place] [phone] [facility] ENT  [place], [place] [facility]  [place]  [phone] [facility] [place], [phone]  [place]  [phone]

## 2013-09-24 ENCOUNTER — Encounter: Payer: Self-pay | Admitting: Gastroenterology

## 2013-10-07 ENCOUNTER — Telehealth (HOSPITAL_COMMUNITY): Payer: Self-pay | Admitting: *Deleted

## 2013-10-07 ENCOUNTER — Ambulatory Visit: Payer: Medicare PPO | Admitting: Audiology

## 2013-10-29 ENCOUNTER — Encounter: Payer: Self-pay | Admitting: Gastroenterology

## 2013-10-29 ENCOUNTER — Ambulatory Visit (AMBULATORY_SURGERY_CENTER): Payer: Medicare PPO | Admitting: Gastroenterology

## 2013-10-29 VITALS — BP 132/60 | HR 50 | Temp 98.0°F | Resp 16 | Ht 71.0 in | Wt 177.0 lb

## 2013-10-29 DIAGNOSIS — D126 Benign neoplasm of colon, unspecified: Secondary | ICD-10-CM

## 2013-10-29 DIAGNOSIS — Z1211 Encounter for screening for malignant neoplasm of colon: Secondary | ICD-10-CM

## 2013-10-29 LAB — GLUCOSE, CAPILLARY
Glucose-Capillary: 107 mg/dL — ABNORMAL HIGH (ref 70–99)
Glucose-Capillary: 88 mg/dL (ref 70–99)

## 2013-10-29 MED ORDER — SODIUM CHLORIDE 0.9 % IV SOLN: 500.0000 mL | INTRAVENOUS | Status: DC

## 2013-10-29 MED ORDER — FLEET ENEMA 7-19 GM/118ML RE ENEM
1.0000 | ENEMA | Freq: Once | RECTAL | Status: AC
  Administered 2013-10-29: 1 via RECTAL

## 2013-10-29 NOTE — Progress Notes (Signed)
Patient drank 1/2 cup plain coffee at 8am.  Rebeca Alert, CRNA is aware.

## 2013-10-29 NOTE — Patient Instructions (Signed)
YOU HAD AN ENDOSCOPIC PROCEDURE TODAY AT THE [facility]: Refer to the procedure report that was given to you for any specific questions about what was found during the examination.  If the procedure report does not answer your questions, please call your gastroenterologist to clarify.  If you requested that your care partner not be given the details of your procedure findings, then the procedure report has been included in a sealed envelope for you to review at your convenience later.  YOU SHOULD EXPECT: Some feelings of bloating in the abdomen. Passage of more gas than usual.  Walking can help get rid of the air that was put into your GI tract during the procedure and reduce the bloating. If you had a lower endoscopy (such as a colonoscopy or flexible sigmoidoscopy) you may notice spotting of blood in your stool or on the toilet paper. If you underwent a bowel prep for your procedure, then you may not have a normal bowel movement for a few days.  DIET: Your first meal following the procedure should be a light meal and then it is ok to progress to your normal diet.  A half-sandwich or bowl of soup is an example of a good first meal.  Heavy or fried foods are harder to digest and may make you feel nauseous or bloated.  Likewise meals heavy in dairy and vegetables can cause extra gas to form and this can also increase the bloating.  Drink plenty of fluids but you should avoid alcoholic beverages for 24 hours.  ACTIVITY: Your care partner should take you home directly after the procedure.  You should plan to take it easy, moving slowly for the rest of the day.  You can resume normal activity the day after the procedure however you should NOT DRIVE or use heavy machinery for 24 hours (because of the sedation medicines used during the test).    SYMPTOMS TO REPORT IMMEDIATELY: A gastroenterologist can be reached at any hour.  During normal business hours, 8:30 AM to 5:00 PM Monday through Friday,  call [phone].  After hours and on weekends, please call the GI answering service at [phone] who will take a message and have the physician on call contact you.   Following lower endoscopy (colonoscopy or flexible sigmoidoscopy):  Excessive amounts of blood in the stool  Significant tenderness or worsening of abdominal pains  Swelling of the abdomen that is new, acute  Fever of 100F or higher  FOLLOW UP: If any biopsies were taken you will be contacted by phone or by letter within the next 1-3 weeks.  Call your gastroenterologist if you have not heard about the biopsies in 3 weeks.  Our staff will call the home number listed on your records the next business day following your procedure to check on you and address any questions or concerns that you may have at that time regarding the information given to you following your procedure. This is a courtesy call and so if there is no answer at the home number and we have not heard from you through the emergency physician on call, we will assume that you have returned to your regular daily activities without incident.  SIGNATURES/CONFIDENTIALITY: You and/or your care partner have signed paperwork which will be entered into your electronic medical record.  These signatures attest to the fact that that the information above on your After Visit Summary has been reviewed and is understood.  Full responsibility of the confidentiality of this   discharge information lies with you and/or your care-partner.  Await pathology  Continue your normal medications  Please read handout about polyps 

## 2013-10-29 NOTE — Progress Notes (Signed)
Called to room to assist during endoscopic procedure.  Patient ID and intended procedure confirmed with present staff. Received instructions for my participation in the procedure from the performing physician.  

## 2013-10-29 NOTE — Op Note (Signed)
[place]  [employer]. [place], [address]   COLONOSCOPY PROCEDURE REPORT  PATIENT: Justin Salinas, Justin Salinas  MR#: [id number] BIRTHDATE: 1940/12/28 , 74  yrs. old GENDER: Male ENDOSCOPIST: Inda Castle, MD REFERRED [id number] Maceo Pro, M.D. PROCEDURE DATE:  10/29/2013 PROCEDURE:   Colonoscopy with snare polypectomy First Screening Colonoscopy - Avg.  risk and is 50 yrs.  old or older - No.  Prior Negative Screening - Now for repeat screening. 10 or more years since last screening  History of Adenoma - Now for follow-up colonoscopy & has been > or = to 3 yrs.  N/A  Polyps Removed Today? Yes. ASA CLASS:   Class II INDICATIONS:Average risk patient for colon cancer. MEDICATIONS: MAC sedation, administered by CRNA and propofol (Diprivan) 200mg  IV  DESCRIPTION OF PROCEDURE:   After the risks benefits and alternatives of the procedure were thoroughly explained, informed consent was obtained.  A digital rectal exam revealed no abnormalities of the rectum.   The [id number]  endoscope was introduced through the anus and advanced to the cecum, which was identified by both the appendix and ileocecal valve. No adverse events experienced.   The quality of the prep was Suprep fair  The instrument was then slowly withdrawn as the colon was fully examined.      COLON FINDINGS: A sessile polyp measuring 7 mm in size was found in the ascending colon.  A polypectomy was performed with a cold snare.  The resection was complete and the polyp tissue was completely retrieved.   A sessile polyp measuring 12 mm in size was found in the sigmoid colon.  A polypectomy was performed.  The resection was complete and the polyp tissue was completely retrieved.   The colon mucosa was otherwise normal.  Retroflexed views revealed no abnormalities. The time to cecum=7 minutes 13 seconds.  Withdrawal time=15 minutes 16 seconds.  The scope was withdrawn and the procedure  completed. COMPLICATIONS: There were no complications.  ENDOSCOPIC IMPRESSION: 1.   Sessile polyp measuring 7 mm in size was found in the ascending colon; polypectomy was performed with a cold snare 2.   Sessile polyp measuring 12 mm in size was found in the sigmoid colon; polypectomy was performed 3.   The colon mucosa was otherwise normal  RECOMMENDATIONS: If the polyp(s) removed today are proven to be adenomatous (pre-cancerous) polyps, you will need a colonoscopy in 3 years. Otherwise you should continue to follow colorectal cancer screening guidelines for "routine risk" patients with a colonoscopy in 10 years.  You will receive a letter within 1-2 weeks with the results of your biopsy as well as final recommendations.  Please call my office if you have not received a letter after 3 weeks.   eSigned:  Inda Castle, MD 10/29/2013 10:37 AM   cc:   PATIENT NAME:  Borum, Donelle Eichner MR#: [id number]

## 2013-10-30 ENCOUNTER — Telehealth: Payer: Self-pay | Admitting: *Deleted

## 2013-10-30 NOTE — Telephone Encounter (Signed)
  Follow up Call-  Call back number 10/29/2013  Post procedure Call Back phone  # [phone]  Permission to leave phone message Yes     Patient questions:  Do you have a fever, pain , or abdominal swelling? no Pain Score  0 *  Have you tolerated food without any problems? yes  Have you been able to return to your normal activities? yes  Do you have any questions about your discharge instructions: Diet   no Medications  no Follow up visit  no  Do you have questions or concerns about your Care? no  Actions: * If pain score is 4 or above: No action needed, pain <4.

## 2013-11-03 ENCOUNTER — Encounter: Payer: Self-pay | Admitting: Gastroenterology

## 2013-11-09 ENCOUNTER — Ambulatory Visit: Payer: Medicare PPO | Admitting: Family Medicine

## 2013-12-10 DIAGNOSIS — IMO0002 Reserved for concepts with insufficient information to code with codable children: Secondary | ICD-10-CM | POA: Insufficient documentation

## 2014-01-20 DIAGNOSIS — H04123 Dry eye syndrome of bilateral lacrimal glands: Secondary | ICD-10-CM | POA: Insufficient documentation

## 2014-05-17 ENCOUNTER — Telehealth: Payer: Self-pay | Admitting: Cardiology

## 2014-05-17 ENCOUNTER — Encounter: Payer: Self-pay | Admitting: *Deleted

## 2014-05-17 NOTE — Telephone Encounter (Signed)
Dr Stanford Breed spoke with dr fried, pt is having exertional angina and they would like the patient seen tomorrow. Dr berry is DOD and has no openings. The patient will see the flex pa tomorrow at [address] location.

## 2014-05-18 ENCOUNTER — Ambulatory Visit (INDEPENDENT_AMBULATORY_CARE_PROVIDER_SITE_OTHER): Payer: Medicare PPO | Admitting: Physician Assistant

## 2014-05-18 ENCOUNTER — Encounter: Payer: Self-pay | Admitting: Physician Assistant

## 2014-05-18 VITALS — BP 147/70 | HR 55 | Ht 71.0 in | Wt 171.0 lb

## 2014-05-18 DIAGNOSIS — R079 Chest pain, unspecified: Secondary | ICD-10-CM

## 2014-05-18 LAB — CBC WITH DIFFERENTIAL/PLATELET
Basophils Absolute: 0 10*3/uL (ref 0.0–0.1)
Basophils Relative: 0.9 % (ref 0.0–3.0)
Eosinophils Absolute: 0.4 10*3/uL (ref 0.0–0.7)
Eosinophils Relative: 6.6 % — ABNORMAL HIGH (ref 0.0–5.0)
HCT: 30.4 % — ABNORMAL LOW (ref 39.0–52.0)
Hemoglobin: 9.8 g/dL — ABNORMAL LOW (ref 13.0–17.0)
Lymphocytes Relative: 24.2 % (ref 12.0–46.0)
Lymphs Abs: 1.3 10*3/uL (ref 0.7–4.0)
MCHC: 32.2 g/dL (ref 30.0–36.0)
MCV: 85.8 fl (ref 78.0–100.0)
Monocytes Absolute: 0.5 10*3/uL (ref 0.1–1.0)
Monocytes Relative: 8.9 % (ref 3.0–12.0)
Neutro Abs: 3.3 10*3/uL (ref 1.4–7.7)
Neutrophils Relative %: 59.4 % (ref 43.0–77.0)
Platelets: 218 10*3/uL (ref 150.0–400.0)
RBC: 3.55 Mil/uL — ABNORMAL LOW (ref 4.22–5.81)
RDW: 14.4 % (ref 11.5–15.5)
WBC: 5.5 10*3/uL (ref 4.0–10.5)

## 2014-05-18 LAB — BASIC METABOLIC PANEL
BUN: 24 mg/dL — ABNORMAL HIGH (ref 6–23)
CO2: 26 mEq/L (ref 19–32)
Calcium: 9.3 mg/dL (ref 8.4–10.5)
Chloride: 103 mEq/L (ref 96–112)
Creatinine, Ser: 1.3 mg/dL (ref 0.4–1.5)
GFR: 55.59 mL/min — ABNORMAL LOW (ref >60.00)
Glucose, Bld: 221 mg/dL — ABNORMAL HIGH (ref 70–99)
Potassium: 4.5 mEq/L (ref 3.5–5.1)
Sodium: 134 mEq/L — ABNORMAL LOW (ref 135–145)

## 2014-05-18 LAB — PROTIME-INR
INR: 1.1 ratio — ABNORMAL HIGH (ref 0.8–1.0)
Prothrombin Time: 12.2 s (ref 9.6–13.1)

## 2014-05-18 NOTE — Progress Notes (Signed)
Cardiology Office Note    Date:  05/18/2014   ID:  Justin Salinas, DOB 01-09-1941, MRN [id number]  PCP:  Abigail Miyamoto, MD  Cardiologist:  Marena Chancy Dr. Rollene Fare- and Kerin Ransom PA-C)      History of Present Illness: Justin Salinas is a 73 y.o. male with a past medical history of CAD s/p RCA PCI ('95), HTN, CKD, DM, and HLD who was added on to the FLEX clinic schedule for evaluation of exertional CP and SOB.  He had a RCA PCI in '95. This was later noted to be occluded in 2000. At that time he also had moderate Lt system disease but he has done well on medical Rx. He was seen in 02/2013 by Kerin Ransom PA-C for evaluation of indigestion. He was placed on a PPI and given SL NTG and set up for a nuclear stress test. He did not show up for this appointment and was lost to follow up. He visited his PCP, Dr. Maceo Pro, yesterday for evaluation of recent exertional chest pain and dyspnea. He is very active and walks about an hour 3-4 times a week. Over the past 2 months he has noted that it is getting harder and harder to walk due to discomfort. The chest pain is a pressure that sometimes radiates to his left shoulder. The chest pressure/dyspnea has gotten progressively worse and worrisome to the patient. His PCP called the [facility] cardiology office to request that he be seen as soon as possible. He does not smoke and takes all his medications as prescribed.  It interesting to note that he has bilat 60-99% renal artery stenosis by doppler but reportedly normal renal arteries at cath in 2000.   Studies:  - Echo (10/2013):  EF 55-60%, no RWMA. Mod concentric hypertrophy. Mod increased ventricular septum thickness. Calcified MV with valve area 2.02 cm^2.   - Carotid US (10/2012):  Left subclavian 50-69% diameter reduction. Right bulb 0-49% diameter reduction. RICA normal. Left mid to distal CCA 0-49% diameter reduction. Left bulb/prox ICA: 50-69% diameter reduction. L vertebral with antgrade  flow. Needs follow up in 1 year.    Recent Labs/Images:  No results for input(s): NA, K, BUN, CREATININE, ALT, HGB, TSH, LDLCALC, LDLDIRECT, HDL, BNP, PROBNP in the last 8760 hours.  Invalid input(s): LDL   No results found.   Wt Readings from Last 3 Encounters:  05/18/14 171 lb (77.565 kg)  10/29/13 177 lb (80.287 kg)  09/09/13 177 lb 3.2 oz (80.377 kg)     Past Medical History  Diagnosis Date  . Allergy   . Diabetes     AODM  . Asthma   . Hyperlipidemia   . Hypertension   . MI (myocardial infarction) 09/22/1993  . CAD (coronary artery disease)   . BPH (benign prostatic hyperplasia)   . GERD (gastroesophageal reflux disease)     Current Outpatient Prescriptions  Medication Sig Dispense Refill  . ACCU-CHEK AVIVA PLUS test strip     . aspirin 81 MG tablet Take 81 mg by mouth daily.    Marland Kitchen glimepiride (AMARYL) 4 MG tablet Take 4 mg by mouth 2 (two) times daily.    Marland Kitchen JANUVIA 100 MG tablet     . Lancets Misc. (ACCU-CHEK SOFTCLIX LANCET DEV) KIT     . lisinopril (PRINIVIL) 20 MG tablet Take 1 tablet (20 mg total) by mouth daily. (Patient taking differently: Take 20 mg by mouth daily. PT STATES HE TAKES 1-2 TABS AS NEEDED IF BP GETS  TOO HIGH)    . metFORMIN (GLUCOPHAGE) 1000 MG tablet Take 1,000 mg by mouth 2 (two) times daily with a meal.    . Multiple Vitamins-Minerals (CENTRUM SILVER ADULT 50+ PO) Take by mouth.    . simvastatin (ZOCOR) 40 MG tablet Take 1 tablet (40 mg total) by mouth every evening. 30 tablet 6  . triamterene-hydrochlorothiazide (DYAZIDE) 37.5-25 MG per capsule Take by mouth every morning. Takes 1/2 tablet on mon, wed, fri    . vitamin B-12 (CYANOCOBALAMIN) 100 MCG tablet Take 50 mcg by mouth daily.    . nitroGLYCERIN (NITROSTAT) 0.4 MG SL tablet Place 1 tablet (0.4 mg total) under the tongue every 5 (five) minutes as needed for chest pain. (Patient not taking: Reported on 05/18/2014) 25 tablet 2   No current facility-administered medications for this visit.       Allergies:   Review of patient's allergies indicates no known allergies.   Social History:  The patient  reports that he quit smoking about 41 years ago. His smoking use included Cigarettes. He smoked 0.00 packs per day. He quit smokeless tobacco use about 15 years ago. His smokeless tobacco use included Snuff and Chew. He reports that he drinks about 1.5 oz of alcohol per week. He reports that he does not use illicit drugs.   Family History:  The patient's family history includes Colon polyps in his brother; Diabetes in his sister; Heart disease in his brother; Uterine cancer in his mother. There is no history of Colon cancer.   ROS:  Please see the history of present illness.   All other systems reviewed and negative.    PHYSICAL EXAM: VS:  BP 147/70 mmHg  Pulse 55  Ht $R'5\' 11"'Ru$  (1.803 m)  Wt 171 lb (77.565 kg)  BMI 23.86 kg/m2 Well nourished, well developed, in no acute distress HEENT: normal Neck: no JVD Cardiac:  normal S1, S2; RRR; no murmur Lungs:  clear to auscultation bilaterally, no wheezing, rhonchi or rales Abd: soft, nontender, no hepatomegaly Ext: no edema Skin: warm and dry Neuro:  CNs 2-12 intact, no focal abnormalities noted  EKG:  HR 55. Sinus brady with ST &TW abnormality, consider lateral ischemia.    ASSESSMENT AND PLAN:  [name] is a 73 y.o. male with a past medical history of CAD s/p RCA PCI ('95), HTN, CKD, DM, and HLD who was added on to the FLEX clinic schedule for evaluation of exertional CP and SOB.  Exertional chest pain/dyspnea- worrisome for [place].  -- LHC tomorrow with Dr. Burt Knack.   Renal artery stenosis- by duplex 10/2012. --   It interesting to note that he has bilat 60-99% renal artery stenosis by doppler but reportedly normal renal arteries at cath in 2000. -- Consider re-look by heart cath tomorrow.  Carotid artery stenosis- abnormal doppler 10/2012 and due for follow up study. Set up next clinic appointment  DM- will hold  Metformin for left heart cath  CKD- creat 1.3 today. Monitor closely after contrast dye exposure.   Signed, Vesta Mixer, PA-C, MHS 05/18/2014 9:38 AM    [facility] [address], [place], [place]  [address] Phone: [phone]; Fax: [phone]

## 2014-05-18 NOTE — Patient Instructions (Signed)
Your physician recommends that you continue on your current medications as directed. Please refer to the Current Medication list given to you today.  Lab Today: Bmet,Cbc, Pt/Inr  Your physician has requested that you have a cardiac catheterization. Cardiac catheterization is used to diagnose and/or treat various heart conditions. Doctors may recommend this procedure for a number of different reasons. The most common reason is to evaluate chest pain. Chest pain can be a symptom of coronary artery disease (CAD), and cardiac catheterization can show whether plaque is narrowing or blocking your heart's arteries. This procedure is also used to evaluate the valves, as well as measure the blood flow and oxygen levels in different parts of your heart. For further information please visit [email or web address]. Please follow instruction sheet, as given.

## 2014-05-19 ENCOUNTER — Inpatient Hospital Stay (HOSPITAL_COMMUNITY)
Admission: RE | Admit: 2014-05-19 | Discharge: 2014-05-26 | DRG: 234 | Disposition: A | Discharge status: Home / Self Care | Payer: Commercial Managed Care - HMO | Source: Ambulatory Visit | Attending: Thoracic Surgery (Cardiothoracic Vascular Surgery) | Admitting: Thoracic Surgery (Cardiothoracic Vascular Surgery)

## 2014-05-19 ENCOUNTER — Other Ambulatory Visit: Payer: Self-pay | Admitting: *Deleted

## 2014-05-19 ENCOUNTER — Encounter (HOSPITAL_COMMUNITY): Payer: Self-pay | Admitting: Cardiovascular Disease

## 2014-05-19 ENCOUNTER — Encounter (HOSPITAL_COMMUNITY)
Admission: RE | Disposition: A | Discharge status: Home / Self Care | Payer: Self-pay | Source: Ambulatory Visit | Attending: Thoracic Surgery (Cardiothoracic Vascular Surgery)

## 2014-05-19 DIAGNOSIS — Z7982 Long term (current) use of aspirin: Secondary | ICD-10-CM | POA: Diagnosis not present

## 2014-05-19 DIAGNOSIS — Z79899 Other long term (current) drug therapy: Secondary | ICD-10-CM | POA: Diagnosis not present

## 2014-05-19 DIAGNOSIS — Z8249 Family history of ischemic heart disease and other diseases of the circulatory system: Secondary | ICD-10-CM

## 2014-05-19 DIAGNOSIS — R079 Chest pain, unspecified: Secondary | ICD-10-CM | POA: Diagnosis present

## 2014-05-19 DIAGNOSIS — I6522 Occlusion and stenosis of left carotid artery: Secondary | ICD-10-CM | POA: Diagnosis present

## 2014-05-19 DIAGNOSIS — J90 Pleural effusion, not elsewhere classified: Secondary | ICD-10-CM

## 2014-05-19 DIAGNOSIS — E119 Type 2 diabetes mellitus without complications: Secondary | ICD-10-CM | POA: Diagnosis present

## 2014-05-19 DIAGNOSIS — K5900 Constipation, unspecified: Secondary | ICD-10-CM | POA: Diagnosis not present

## 2014-05-19 DIAGNOSIS — I252 Old myocardial infarction: Secondary | ICD-10-CM

## 2014-05-19 DIAGNOSIS — J45909 Unspecified asthma, uncomplicated: Secondary | ICD-10-CM | POA: Diagnosis present

## 2014-05-19 DIAGNOSIS — I701 Atherosclerosis of renal artery: Secondary | ICD-10-CM | POA: Diagnosis present

## 2014-05-19 DIAGNOSIS — I2582 Chronic total occlusion of coronary artery: Secondary | ICD-10-CM | POA: Diagnosis present

## 2014-05-19 DIAGNOSIS — I25119 Atherosclerotic heart disease of native coronary artery with unspecified angina pectoris: Secondary | ICD-10-CM

## 2014-05-19 DIAGNOSIS — Z87891 Personal history of nicotine dependence: Secondary | ICD-10-CM | POA: Diagnosis not present

## 2014-05-19 DIAGNOSIS — D62 Acute posthemorrhagic anemia: Secondary | ICD-10-CM | POA: Diagnosis not present

## 2014-05-19 DIAGNOSIS — I349 Nonrheumatic mitral valve disorder, unspecified: Secondary | ICD-10-CM

## 2014-05-19 DIAGNOSIS — I25110 Atherosclerotic heart disease of native coronary artery with unstable angina pectoris: Secondary | ICD-10-CM | POA: Diagnosis not present

## 2014-05-19 DIAGNOSIS — D696 Thrombocytopenia, unspecified: Secondary | ICD-10-CM | POA: Diagnosis not present

## 2014-05-19 DIAGNOSIS — Z955 Presence of coronary angioplasty implant and graft: Secondary | ICD-10-CM | POA: Diagnosis not present

## 2014-05-19 DIAGNOSIS — I771 Stricture of artery: Secondary | ICD-10-CM | POA: Diagnosis present

## 2014-05-19 DIAGNOSIS — I2584 Coronary atherosclerosis due to calcified coronary lesion: Secondary | ICD-10-CM | POA: Diagnosis present

## 2014-05-19 DIAGNOSIS — N400 Enlarged prostate without lower urinary tract symptoms: Secondary | ICD-10-CM | POA: Diagnosis present

## 2014-05-19 DIAGNOSIS — I2510 Atherosclerotic heart disease of native coronary artery without angina pectoris: Secondary | ICD-10-CM

## 2014-05-19 DIAGNOSIS — Z951 Presence of aortocoronary bypass graft: Secondary | ICD-10-CM

## 2014-05-19 DIAGNOSIS — E785 Hyperlipidemia, unspecified: Secondary | ICD-10-CM | POA: Diagnosis present

## 2014-05-19 DIAGNOSIS — Z833 Family history of diabetes mellitus: Secondary | ICD-10-CM

## 2014-05-19 DIAGNOSIS — I129 Hypertensive chronic kidney disease with stage 1 through stage 4 chronic kidney disease, or unspecified chronic kidney disease: Secondary | ICD-10-CM | POA: Diagnosis present

## 2014-05-19 DIAGNOSIS — K219 Gastro-esophageal reflux disease without esophagitis: Secondary | ICD-10-CM | POA: Diagnosis present

## 2014-05-19 DIAGNOSIS — J9811 Atelectasis: Secondary | ICD-10-CM

## 2014-05-19 DIAGNOSIS — N189 Chronic kidney disease, unspecified: Secondary | ICD-10-CM | POA: Diagnosis present

## 2014-05-19 DIAGNOSIS — E8779 Other fluid overload: Secondary | ICD-10-CM | POA: Diagnosis not present

## 2014-05-19 DIAGNOSIS — Z8049 Family history of malignant neoplasm of other genital organs: Secondary | ICD-10-CM | POA: Diagnosis not present

## 2014-05-19 DIAGNOSIS — I209 Angina pectoris, unspecified: Secondary | ICD-10-CM | POA: Diagnosis present

## 2014-05-19 HISTORY — PX: LEFT HEART CATHETERIZATION WITH CORONARY ANGIOGRAM: SHX5451

## 2014-05-19 LAB — CBC
HCT: 29.6 % — ABNORMAL LOW (ref 39.0–52.0)
Hemoglobin: 9.1 g/dL — ABNORMAL LOW (ref 13.0–17.0)
MCH: 26.8 pg (ref 26.0–34.0)
MCHC: 30.7 g/dL (ref 30.0–36.0)
MCV: 87.1 fL (ref 78.0–100.0)
Platelets: 227 10*3/uL (ref 150–400)
RBC: 3.4 MIL/uL — ABNORMAL LOW (ref 4.22–5.81)
RDW: 13.9 % (ref 11.5–15.5)
WBC: 4.6 10*3/uL (ref 4.0–10.5)

## 2014-05-19 LAB — MRSA PCR SCREENING: MRSA by PCR: NEGATIVE

## 2014-05-19 LAB — GLUCOSE, CAPILLARY
Glucose-Capillary: 118 mg/dL — ABNORMAL HIGH (ref 70–99)
Glucose-Capillary: 59 mg/dL — ABNORMAL LOW (ref 70–99)
Glucose-Capillary: 85 mg/dL (ref 70–99)
Glucose-Capillary: 372 mg/dL — ABNORMAL HIGH (ref 70–99)

## 2014-05-19 SURGERY — LEFT HEART CATHETERIZATION WITH CORONARY ANGIOGRAM
Anesthesia: LOCAL

## 2014-05-19 MED ORDER — SODIUM CHLORIDE 0.9 % IJ SOLN: 3.0000 mL | INTRAMUSCULAR | Status: DC | PRN

## 2014-05-19 MED ORDER — ASPIRIN 81 MG PO CHEW: 81.0000 mg | CHEWABLE_TABLET | ORAL | Status: DC

## 2014-05-19 MED ORDER — ACETAMINOPHEN 325 MG PO TABS: 650.0000 mg | ORAL_TABLET | ORAL | Status: DC | PRN

## 2014-05-19 MED ORDER — GLIMEPIRIDE 4 MG PO TABS
4.0000 mg | ORAL_TABLET | Freq: Two times a day (BID) | ORAL | Status: DC
  Administered 2014-05-19 – 2014-05-20 (×3): 4 mg via ORAL
  Filled 2014-05-19 (×6): qty 1

## 2014-05-19 MED ORDER — MIDAZOLAM HCL 2 MG/2ML IJ SOLN
INTRAMUSCULAR | Status: AC
  Filled 2014-05-19: qty 2

## 2014-05-19 MED ORDER — INSULIN ASPART 100 UNIT/ML ~~LOC~~ SOLN
0.0000 [IU] | Freq: Three times a day (TID) | SUBCUTANEOUS | Status: DC
  Administered 2014-05-20: 5 [IU] via SUBCUTANEOUS

## 2014-05-19 MED ORDER — SODIUM CHLORIDE 0.9 % IJ SOLN: 3.0000 mL | Freq: Two times a day (BID) | INTRAMUSCULAR | Status: DC

## 2014-05-19 MED ORDER — SODIUM CHLORIDE 0.9 % IV SOLN: 250.0000 mL | INTRAVENOUS | Status: DC | PRN

## 2014-05-19 MED ORDER — SODIUM CHLORIDE 0.9 % IJ SOLN
3.0000 mL | Freq: Two times a day (BID) | INTRAMUSCULAR | Status: DC
  Administered 2014-05-19 – 2014-05-20 (×3): 3 mL via INTRAVENOUS

## 2014-05-19 MED ORDER — HEPARIN SODIUM (PORCINE) 1000 UNIT/ML IJ SOLN
INTRAMUSCULAR | Status: AC
  Filled 2014-05-19: qty 1

## 2014-05-19 MED ORDER — SIMVASTATIN 40 MG PO TABS
40.0000 mg | ORAL_TABLET | Freq: Every evening | ORAL | Status: DC
  Administered 2014-05-19 – 2014-05-20 (×2): 40 mg via ORAL
  Filled 2014-05-19 (×4): qty 1

## 2014-05-19 MED ORDER — SODIUM CHLORIDE 0.9 % IV SOLN
1.0000 mL/kg/h | INTRAVENOUS | Status: AC
Start: 2014-05-19 — End: 2014-05-20
  Administered 2014-05-19: 1 mL/kg/h via INTRAVENOUS

## 2014-05-19 MED ORDER — HEPARIN (PORCINE) IN NACL 2-0.9 UNIT/ML-% IJ SOLN
INTRAMUSCULAR | Status: AC
  Filled 2014-05-19: qty 1500

## 2014-05-19 MED ORDER — OXYCODONE-ACETAMINOPHEN 5-325 MG PO TABS: 1.0000 | ORAL_TABLET | ORAL | Status: DC | PRN

## 2014-05-19 MED ORDER — LIDOCAINE HCL (PF) 1 % IJ SOLN
INTRAMUSCULAR | Status: AC
  Filled 2014-05-19: qty 30

## 2014-05-19 MED ORDER — ASPIRIN EC 81 MG PO TBEC
81.0000 mg | DELAYED_RELEASE_TABLET | Freq: Every day | ORAL | Status: DC
  Administered 2014-05-19 – 2014-05-20 (×2): 81 mg via ORAL
  Filled 2014-05-19 (×3): qty 1

## 2014-05-19 MED ORDER — ASPIRIN 81 MG PO TABS: 81.0000 mg | ORAL_TABLET | Freq: Every day | ORAL | Status: DC

## 2014-05-19 MED ORDER — ONDANSETRON HCL 4 MG/2ML IJ SOLN: 4.0000 mg | Freq: Four times a day (QID) | INTRAMUSCULAR | Status: DC | PRN

## 2014-05-19 MED ORDER — LINAGLIPTIN 5 MG PO TABS
5.0000 mg | ORAL_TABLET | Freq: Every day | ORAL | Status: DC
  Administered 2014-05-20: 5 mg via ORAL
  Filled 2014-05-19 (×2): qty 1

## 2014-05-19 MED ORDER — VERAPAMIL HCL 2.5 MG/ML IV SOLN
INTRAVENOUS | Status: AC
  Filled 2014-05-19: qty 2

## 2014-05-19 MED ORDER — FENTANYL CITRATE 0.05 MG/ML IJ SOLN
INTRAMUSCULAR | Status: AC
  Filled 2014-05-19: qty 2

## 2014-05-19 MED ORDER — NITROGLYCERIN 1 MG/10 ML FOR IR/CATH LAB
INTRA_ARTERIAL | Status: AC
  Filled 2014-05-19: qty 10

## 2014-05-19 MED ORDER — ASPIRIN 81 MG PO CHEW
CHEWABLE_TABLET | ORAL | Status: AC
  Administered 2014-05-19: 81 mg
  Filled 2014-05-19: qty 1

## 2014-05-19 MED ORDER — SODIUM CHLORIDE 0.9 % IV SOLN
INTRAVENOUS | Status: DC
Start: 2014-05-19 — End: 2014-05-19
  Administered 2014-05-19: 12:00:00 via INTRAVENOUS

## 2014-05-19 MED ORDER — HEPARIN (PORCINE) IN NACL 100-0.45 UNIT/ML-% IJ SOLN
1200.0000 [IU]/h | INTRAMUSCULAR | Status: DC
  Administered 2014-05-19: 950 [IU]/h via INTRAVENOUS
  Administered 2014-05-20: 1200 [IU]/h via INTRAVENOUS
  Filled 2014-05-19 (×4): qty 250

## 2014-05-19 MED ORDER — ALPRAZOLAM 0.25 MG PO TABS: 0.2500 mg | ORAL_TABLET | ORAL | Status: DC | PRN

## 2014-05-19 NOTE — Progress Notes (Signed)
Pt. Arrived back from cath lab with TR band to right radial. Air deflated following protocol. All air deflated and TR band removed with no complications. Site level 0, no bleeding noted. Patient educated to keep arm at heart level and to notify RN if he notices any bleeding. Pressure dressing placed at site. Will continue to monitor. Roxan Hockey, RN

## 2014-05-19 NOTE — Interval H&P Note (Signed)
History and Physical Interval Note:  05/19/2014 3:59 PM  Justin Salinas  has presented today for surgery, with the diagnosis of cp  The various methods of treatment have been discussed with the patient and family. After consideration of risks, benefits and other options for treatment, the patient has consented to  Procedure(s): LEFT HEART CATHETERIZATION WITH CORONARY ANGIOGRAM (N/A) as a surgical intervention .  The patient's history has been reviewed, patient examined, no change in status, stable for surgery.  I have reviewed the patient's chart and labs.  Questions were answered to the patient's satisfaction.    Cath Lab Visit (complete for each Cath Lab visit)  Clinical Evaluation Leading to the Procedure:   ACS: No.  Non-ACS:    Anginal Classification: CCS III  Anti-ischemic medical therapy: No Therapy  Non-Invasive Test Results: No non-invasive testing performed  Prior CABG: No previous CABG       [name]

## 2014-05-19 NOTE — Progress Notes (Signed)
ANTICOAGULATION CONSULT NOTE - Initial Consult  Pharmacy Consult for Heparin Indication: chest pain/ACS - to start 4 hours post TR band removal  No Known Allergies  Patient Measurements: Height: _0  (180.3 cm) Weight: 170 lb (77.111 kg) IBW/kg (Calculated) : 75.3 Heparin Dosing Weight: 77 kg  Vital Signs: Temp: 98 F (36.7 C) (12/16 1128) Temp Source: Oral (12/16 1128) BP: 128/57 mmHg (12/16 1128) Pulse Rate: 58 (12/16 1128)  Labs:  Recent Labs  05/18/14 1010 05/19/14 1200  HGB 9.8* 9.1*  HCT 30.4* 29.6*  PLT 218.0 227  LABPROT 12.2  --   INR 1.1*  --   CREATININE 1.3  --     Estimated Creatinine Clearance: 52.3 mL/min (by C-G formula based on Cr of 1.3).   Medical History: Past Medical History  Diagnosis Date  . Allergy   . Diabetes     AODM  . Asthma   . Hyperlipidemia   . Hypertension   . MI (myocardial infarction) 09/22/1993  . CAD (coronary artery disease)   . BPH (benign prostatic hyperplasia)   . GERD (gastroesophageal reflux disease)   . Ischemic chest pain 05/19/2014    Medications:  Prescriptions prior to admission  Medication Sig Dispense Refill Last Dose  . ACCU-CHEK AVIVA PLUS test strip    05/19/2014 at Unknown time  . aspirin 81 MG tablet Take 81 mg by mouth daily.   05/18/2014 at 0900  . glimepiride (AMARYL) 4 MG tablet Take 4 mg by mouth 2 (two) times daily.   05/17/2014 at 0900  . JANUVIA 100 MG tablet Take 100 mg by mouth daily.    05/18/2014 at 0900  . Lancets Misc. (ACCU-CHEK SOFTCLIX LANCET DEV) KIT    05/18/2014 at Unknown time  . lisinopril (PRINIVIL) 20 MG tablet Take 1 tablet (20 mg total) by mouth daily. (Patient taking differently: Take 20 mg by mouth daily. TAKING 1 IN THE AM)   05/19/2014 at 0930  . metFORMIN (GLUCOPHAGE) 1000 MG tablet Take 1,000 mg by mouth 2 (two) times daily with a meal.   05/18/2014 at 0900  . Multiple Vitamins-Minerals (CENTRUM SILVER ADULT 50+ PO) Take by mouth.   05/18/2014 at Unknown time  .  simvastatin (ZOCOR) 40 MG tablet Take 1 tablet (40 mg total) by mouth every evening. 30 tablet 6 05/17/2014 at 1800  . triamterene-hydrochlorothiazide (DYAZIDE) 37.5-25 MG per capsule Take by mouth every morning. Takes 1/2 tablet on mon, wed, fri   Taking  . nitroGLYCERIN (NITROSTAT) 0.4 MG SL tablet Place 1 tablet (0.4 mg total) under the tongue every 5 (five) minutes as needed for chest pain. (Patient not taking: Reported on 05/18/2014) 25 tablet 2 Unknown at Unknown time  . triamterene-hydrochlorothiazide (MAXZIDE-25) 37.5-25 MG per tablet Take 0.5 tablets by mouth once a week. Takes on Mondays   Unknown at Unknown time  . vitamin B-12 (CYANOCOBALAMIN) 100 MCG tablet Take 50 mcg by mouth daily.   Unknown at Unknown time    Assessment: 73 year old male status post left heart catheterization found to have severe 2 vessel disease and moderate L-main disease with plan for CABG on Friday to start heparin 4 hours post removal of R-radial TR band. TR band was removed at 18:50PM.   Anticoagulation: H/H low-stable, Platelets within normal limits. Baseline INR 1.1. SCr 1.3 with CrCl ~50-55 mL/min. No bleeding reported. No issues with TR band removal. 95  Goal of Therapy:  Heparin level 0.3-0.7 units/ml Monitor platelets by anticoagulation protocol: Yes   Plan:  Start heparin (NO bolus due to recent procedure) at 950 units/hr at  23:00 PM. Heparin level 6 hours after drip initiated - ok with AM labs.  Daily heparin level and CBC while on therapy.  Sloan Leiter, PharmD, BCPS Clinical Pharmacist [phone] 05/19/2014,5:07 PM

## 2014-05-19 NOTE — H&P (View-Only) (Signed)
Cardiology Office Note    Date:  05/18/2014   ID:  Justin Salinas, DOB 06-Sep-1940, MRN [id number]  PCP:  Abigail Miyamoto, MD  Cardiologist:  Marena Chancy Dr. Rollene Fare- and Kerin Ransom PA-C)      History of Present Illness: Justin Salinas is a 73 y.o. male with a past medical history of CAD s/p RCA PCI ('95), HTN, CKD, DM, and HLD who was added on to the FLEX clinic schedule for evaluation of exertional CP and SOB.  He had a RCA PCI in '95. This was later noted to be occluded in 2000. At that time he also had moderate Lt system disease but he has done well on medical Rx. He was seen in 02/2013 by Kerin Ransom PA-C for evaluation of indigestion. He was placed on a PPI and given SL NTG and set up for a nuclear stress test. He did not show up for this appointment and was lost to follow up. He visited his PCP, Dr. Maceo Pro, yesterday for evaluation of recent exertional chest pain and dyspnea. He is very active and walks about an hour 3-4 times a week. Over the past 2 months he has noted that it is getting harder and harder to walk due to discomfort. The chest pain is a pressure that sometimes radiates to his left shoulder. The chest pressure/dyspnea has gotten progressively worse and worrisome to the patient. His PCP called the [facility] cardiology office to request that he be seen as soon as possible. He does not smoke and takes all his medications as prescribed.  It interesting to note that he has bilat 60-99% renal artery stenosis by doppler but reportedly normal renal arteries at cath in 2000.   Studies:  - Echo (10/2013):  EF 55-60%, no RWMA. Mod concentric hypertrophy. Mod increased ventricular septum thickness. Calcified MV with valve area 2.02 cm^2.   - Carotid US (10/2012):  Left subclavian 50-69% diameter reduction. Right bulb 0-49% diameter reduction. RICA normal. Left mid to distal CCA 0-49% diameter reduction. Left bulb/prox ICA: 50-69% diameter reduction. L vertebral with antgrade  flow. Needs follow up in 1 year.    Recent Labs/Images:  No results for input(s): NA, K, BUN, CREATININE, ALT, HGB, TSH, LDLCALC, LDLDIRECT, HDL, BNP, PROBNP in the last 8760 hours.  Invalid input(s): LDL   No results found.   Wt Readings from Last 3 Encounters:  05/18/14 171 lb (77.565 kg)  10/29/13 177 lb (80.287 kg)  09/09/13 177 lb 3.2 oz (80.377 kg)     Past Medical History  Diagnosis Date  . Allergy   . Diabetes     AODM  . Asthma   . Hyperlipidemia   . Hypertension   . MI (myocardial infarction) 09/22/1993  . CAD (coronary artery disease)   . BPH (benign prostatic hyperplasia)   . GERD (gastroesophageal reflux disease)     Current Outpatient Prescriptions  Medication Sig Dispense Refill  . ACCU-CHEK AVIVA PLUS test strip     . aspirin 81 MG tablet Take 81 mg by mouth daily.    Marland Kitchen glimepiride (AMARYL) 4 MG tablet Take 4 mg by mouth 2 (two) times daily.    Marland Kitchen JANUVIA 100 MG tablet     . Lancets Misc. (ACCU-CHEK SOFTCLIX LANCET DEV) KIT     . lisinopril (PRINIVIL) 20 MG tablet Take 1 tablet (20 mg total) by mouth daily. (Patient taking differently: Take 20 mg by mouth daily. PT STATES HE TAKES 1-2 TABS AS NEEDED IF BP GETS  TOO HIGH)    . metFORMIN (GLUCOPHAGE) 1000 MG tablet Take 1,000 mg by mouth 2 (two) times daily with a meal.    . Multiple Vitamins-Minerals (CENTRUM SILVER ADULT 50+ PO) Take by mouth.    . simvastatin (ZOCOR) 40 MG tablet Take 1 tablet (40 mg total) by mouth every evening. 30 tablet 6  . triamterene-hydrochlorothiazide (DYAZIDE) 37.5-25 MG per capsule Take by mouth every morning. Takes 1/2 tablet on mon, wed, fri    . vitamin B-12 (CYANOCOBALAMIN) 100 MCG tablet Take 50 mcg by mouth daily.    . nitroGLYCERIN (NITROSTAT) 0.4 MG SL tablet Place 1 tablet (0.4 mg total) under the tongue every 5 (five) minutes as needed for chest pain. (Patient not taking: Reported on 05/18/2014) 25 tablet 2   No current facility-administered medications for this visit.       Allergies:   Review of patient's allergies indicates no known allergies.   Social History:  The patient  reports that he quit smoking about 41 years ago. His smoking use included Cigarettes. He smoked 0.00 packs per day. He quit smokeless tobacco use about 15 years ago. His smokeless tobacco use included Snuff and Chew. He reports that he drinks about 1.5 oz of alcohol per week. He reports that he does not use illicit drugs.   Family History:  The patient's family history includes Colon polyps in his brother; Diabetes in his sister; Heart disease in his brother; Uterine cancer in his mother. There is no history of Colon cancer.   ROS:  Please see the history of present illness.   All other systems reviewed and negative.    PHYSICAL EXAM: VS:  BP 147/70 mmHg  Pulse 55  Ht $R'5\' 11"'qR$  (1.803 m)  Wt 171 lb (77.565 kg)  BMI 23.86 kg/m2 Well nourished, well developed, in no acute distress HEENT: normal Neck: no JVD Cardiac:  normal S1, S2; RRR; no murmur Lungs:  clear to auscultation bilaterally, no wheezing, rhonchi or rales Abd: soft, nontender, no hepatomegaly Ext: no edema Skin: warm and dry Neuro:  CNs 2-12 intact, no focal abnormalities noted  EKG:  HR 55. Sinus brady with ST &TW abnormality, consider lateral ischemia.    ASSESSMENT AND PLAN:  Justin Salinas is a 73 y.o. male with a past medical history of CAD s/p RCA PCI ('95), HTN, CKD, DM, and HLD who was added on to the FLEX clinic schedule for evaluation of exertional CP and SOB.  Exertional chest pain/dyspnea- worrisome for [place].  -- LHC tomorrow with Dr. Burt Knack.   Renal artery stenosis- by duplex 10/2012. --   It interesting to note that he has bilat 60-99% renal artery stenosis by doppler but reportedly normal renal arteries at cath in 2000. -- Consider re-look by heart cath tomorrow.  Carotid artery stenosis- abnormal doppler 10/2012 and due for follow up study. Set up next clinic appointment  DM- will hold  Metformin for left heart cath  CKD- creat 1.3 today. Monitor closely after contrast dye exposure.   Signed, Vesta Mixer, PA-C, MHS 05/18/2014 9:38 AM    [facility] [address], [place], [place]  [address] Phone: [phone]; Fax: [phone]

## 2014-05-19 NOTE — Consult Note (Signed)
Reason for Consult: 3 vessel CAD with exertional angina Referring Physician: Dr. Arbutus Leas Justin Salinas is an 73 y.o. male.  HPI: 73 yo man with known CAD presents with chest pain  Justin Salinas is a 73 year old man with a history of CAD, MI, hypertension, chronic kidney disease, type II diabetes and tobacco abuse. He had an inferior MI in the past. He had PCI of the RCA in 1995. The RCA was occluded in 2000. He had moderate disease in the left system that was treated medically.  Justin Salinas is a 73 y.o. male with a past medical history of CAD s/p RCA PCI ('95), HTN, CKD, DM, and HLD who was added on to the [facility] clinic schedule for evaluation of exertional CP and SOB.  He is very active and walks about an hour 3-4 times a week at his church. Over the past 2 months he has noted that it is getting harder to walk due to chest pressure. He says he has to make himself do it. The chest pain is a deep pressure sensation that sometimes radiates to his left shoulder. It is associated with shortness of breath and is relieved with 5- 10 minutes of rest. He has not had any rest or nocturnal pain. It has gotten progressively worse over the past 2 months. He saw Dr. Maceo Pro and was referred to Cardiology.   He underwent catheterization today which showed a chronically occluded RCA, a tight ostial LAD stenosis, and moderate disease in the short left main, left circumflex and diagonal 1.  He has bilat 60-99% renal artery stenosis by doppler but reportedly normal renal arteries at cath in 2000. He also has a 50- 69% left subclavian stenosis.  He is currently pain free. He is a retired Administrator. He quit smoking 30 years ago, but still uses [place] occasionally.   Studies: - Echo (10/2013): EF 55-60%, no RWMA. Mod concentric hypertrophy. Mod increased ventricular septum thickness. Calcified MV with valve area 2.02 cm^2.  - Carotid US (10/2012): Left subclavian 50-69% diameter reduction. Right bulb  0-49% diameter reduction. RICA normal. Left mid to distal CCA 0-49% diameter reduction. Left bulb/prox ICA: 50-69% diameter reduction. L vertebral with antgrade flow. Needs follow up in 1 year.   Past Medical History  Diagnosis Date  . Allergy   . Diabetes     AODM  . Asthma   . Hyperlipidemia   . Hypertension   . MI (myocardial infarction) 09/22/1993  . CAD (coronary artery disease)   . BPH (benign prostatic hyperplasia)   . GERD (gastroesophageal reflux disease)   . Ischemic chest pain 05/19/2014    Past Surgical History  Procedure Laterality Date  . Back surgery  2000    back fusion - Dr. Earnie Larsson  . Vasectomy  1963  . Angioplasty  09/22/1993    POBA of RCA (Dr. Marella Chimes)  . Transthoracic echocardiogram  10/20/2012    EF 55-60%, moderate concentric hypertrophy, ventricular septum thickness increased, calcified MV annulus  . Nm myocar perf wall motion  09/19/2009    bruce myoview - mild perfusion defect in basal inferior region (infarct/scar), EF 60%, low risk scan  . Cardiac catheterization  11/28/1998    "silent occlusion" of RCA w/collaterals from left coronary system, prox LAD w/40-50% eccentric narrowing, 1st diagonal with 70-80% eccentric narrowing, 85% narrowing of prox small OM1 (Dr. Marella Chimes)  . Renal doppler  10/2011    SMA w/ 70-99% diameter reduction & high grade  stenosis; R&L renals w/narrowing and increased velocities (60-99%), R kidney smaller than L  . Carotid doppler  07/2011    left subclavian (50-69%); right bulb (0-49%); RICA (normal); left mid-distal CCA (0-49%); left bulb/prox ICA (50-69%); left vertebral with abnormal antegrade flow  . Nasal sinus surgery  2010    Family History  Problem Relation Age of Onset  . Colon cancer Neg Hx   . Uterine cancer Mother   . Diabetes Sister   . Colon polyps Brother   . Heart disease Brother     Social History:  reports that he quit smoking about 41 years ago. His smoking use included Cigarettes. He smoked  0.00 packs per day. His smokeless tobacco use includes Snuff. He reports that he drinks about 1.8 oz of alcohol per week. He reports that he does not use illicit drugs.  Allergies: No Known Allergies  Medications:  Prior to Admission:  Prescriptions prior to admission  Medication Sig Dispense Refill Last Dose  . ACCU-CHEK AVIVA PLUS test strip    05/19/2014 at Unknown time  . aspirin 81 MG tablet Take 81 mg by mouth daily.   05/18/2014 at 0900  . glimepiride (AMARYL) 4 MG tablet Take 4 mg by mouth 2 (two) times daily.   05/17/2014 at 0900  . JANUVIA 100 MG tablet Take 100 mg by mouth daily.    05/18/2014 at 0900  . Lancets Misc. (ACCU-CHEK SOFTCLIX LANCET DEV) KIT    05/18/2014 at Unknown time  . lisinopril (PRINIVIL) 20 MG tablet Take 1 tablet (20 mg total) by mouth daily. (Patient taking differently: Take 20 mg by mouth daily. TAKING 1 IN THE AM)   05/19/2014 at 0930  . metFORMIN (GLUCOPHAGE) 1000 MG tablet Take 1,000 mg by mouth 2 (two) times daily with a meal.   05/18/2014 at 0900  . Multiple Vitamins-Minerals (CENTRUM SILVER ADULT 50+ PO) Take by mouth.   05/18/2014 at Unknown time  . simvastatin (ZOCOR) 40 MG tablet Take 1 tablet (40 mg total) by mouth every evening. 30 tablet 6 05/17/2014 at 1800  . triamterene-hydrochlorothiazide (DYAZIDE) 37.5-25 MG per capsule Take by mouth every morning. Takes 1/2 tablet on mon, wed, fri   Taking  . nitroGLYCERIN (NITROSTAT) 0.4 MG SL tablet Place 1 tablet (0.4 mg total) under the tongue every 5 (five) minutes as needed for chest pain. (Patient not taking: Reported on 05/18/2014) 25 tablet 2 Unknown at Unknown time  . triamterene-hydrochlorothiazide (MAXZIDE-25) 37.5-25 MG per tablet Take 0.5 tablets by mouth once a week. Takes on Mondays   Unknown at Unknown time  . vitamin B-12 (CYANOCOBALAMIN) 100 MCG tablet Take 50 mcg by mouth daily.   Unknown at Unknown time    Results for orders placed or performed during the hospital encounter of 05/19/14  (from the past 48 hour(s))  Glucose, capillary     Status: Abnormal   Collection Time: 05/19/14 11:37 AM  Result Value Ref Range   Glucose-Capillary 118 (H) 70 - 99 mg/dL  CBC     Status: Abnormal   Collection Time: 05/19/14 12:00 PM  Result Value Ref Range   WBC 4.6 4.0 - 10.5 K/uL   RBC 3.40 (L) 4.22 - 5.81 MIL/uL   Hemoglobin 9.1 (L) 13.0 - 17.0 g/dL   HCT 29.6 (L) 39.0 - 52.0 %   MCV 87.1 78.0 - 100.0 fL   MCH 26.8 26.0 - 34.0 pg   MCHC 30.7 30.0 - 36.0 g/dL   RDW 13.9 11.5 - 15.5 %  Platelets 227 150 - 400 K/uL  Glucose, capillary     Status: Abnormal   Collection Time: 05/19/14  5:17 PM  Result Value Ref Range   Glucose-Capillary 59 (L) 70 - 99 mg/dL    No results found.  Review of Systems  Constitutional: Positive for malaise/fatigue. Negative for fever and chills.  Respiratory: Positive for shortness of breath.   Cardiovascular: Positive for chest pain.  Gastrointestinal:       Reflux  Neurological: Negative for speech change and focal weakness.  Endo/Heme/Allergies: Does not bruise/bleed easily.  All other systems reviewed and are negative.  Blood pressure 128/57, pulse 58, temperature 98 F (36.7 C), temperature source Oral, resp. rate 18, height _0  (1.803 m), weight 170 lb (77.111 kg), SpO2 100 %. Physical Exam  Vitals reviewed. Constitutional: He is oriented to person, place, and time. He appears well-developed and well-nourished. No distress.  HENT:  Head: Normocephalic and atraumatic.  Eyes: EOM are normal. Pupils are equal, round, and reactive to light.  Neck: Neck supple. No thyromegaly present.  No carotid bruits  Cardiovascular: Normal rate, regular rhythm, normal heart sounds and intact distal pulses.   No murmur heard. + left subclavian bruit  Respiratory: Effort normal and breath sounds normal. He has no wheezes. He has no rales.  GI: Soft. There is no tenderness.  Musculoskeletal: He exhibits no edema.  Lymphadenopathy:    He has no  cervical adenopathy.  Neurological: He is alert and oriented to person, place, and time. No cranial nerve deficit.  No focal motor deficit  Skin: Skin is warm and dry.   CARDIAC CATHETERIZATION Procedural Findings: Hemodynamics: AO 139/45 LV 151/21  Coronary angiography: Coronary dominance: right  Left mainstem: There is 50% proximal stenosis. The left main divides into the LAD and LCx.   Left anterior descending (LAD): There is critical ostial/proximal LAD stenosis, estimated at 95%, with heavy calcification. The remainder of the LAD is patent with mild irregularity. The first diagonal is moderate in caliber with 75% proximal stenosis.   Left circumflex (LCx): There is a large intermediate branch without significant stenosis. The AV circumflex has mild irregularity and supplies 2 OM branches.   Right coronary artery (RCA): Severe calcification. Total occlusion at the ostium. The distal branch vessels of the RCA fill from left-to-right collaterals.   Left ventriculography: the inferior wall is mildly hypokinetic. The remaining LV wall segments contract normally with an LVEF of 55%  Aortic arch angiogram: heavy arch calcification. Heavy calcification at the origin of the left carotid and left subclavian.   Estimated Blood Loss: minimal  Final Conclusions:  1. Severe ostial LAD stenosis 2. Total occlusion of the RCA 3. Moderate left main stenosis 4. Patent left circumflex 5. Preserved LV systolic function  Recommendations: Hospital admission, TCTS consult for CABG, start IV heparin, high-risk coronary anatomy.   Assessment/Plan: 73 yo man with severe 2 vessel CAD and moderate left main disease. He needs CABG for survival benefit as well as relief of symptoms.  I discussed the general nature of the procedure, the need for general anesthesia, and the incisions to be used with Justin Salinas. We discussed the expected hospital stay, overall recovery and short and long term  outcomes. He understands the risks include, but are not limited to death, stroke, MI, DVT/PE, bleeding, possible need for transfusion, infections, cardiac arrhythmias, and other organ system dysfunction including respiratory, renal, or GI complications. He understands the risks of early and/ or late graft occlusion. He accepts these  risks and agrees to proceed.  He has a left subclavian bruit and documented left subclavian stenosis. Will plan to use LIMA as a free graft or RIMA depending on anatomy.  For OR Friday AM  HENDRICKSON,STEVEN C 05/19/2014, 5:27 PM

## 2014-05-19 NOTE — CV Procedure (Signed)
    Cardiac Catheterization Procedure Note  Name: Ballard Thibodeaux Schwertner MRN: [id number] DOB: 01-15-1941  Procedure: Left Heart Cath, Selective Coronary Angiography, LV angiography  Indication: Progressive, CCS Class anginal symptoms. 73 year-old male with remote DMI s/p PTCA and known occlusion of the RCA dating back to 2000. Now he presents with progressive angina on exertion.   Procedural Details: The right wrist was prepped, draped, and anesthetized with 1% lidocaine. Using the modified Seldinger technique, a 5/6 French Slender sheath was introduced into the right radial artery. 3 mg of verapamil was administered through the sheath, weight-based unfractionated heparin was administered intravenously. Standard Judkins catheters were used for selective coronary angiography and left ventriculography. Catheter exchanges were performed over an exchange length guidewire. There were no immediate procedural complications. A TR band was used for radial hemostasis at the completion of the procedure.  The patient was transferred to the post catheterization recovery area for further monitoring.  Procedural Findings: Hemodynamics: AO 139/45 LV 151/21  Coronary angiography: Coronary dominance: right  Left mainstem: There is 50% proximal stenosis. The left main divides into the LAD and LCx.   Left anterior descending (LAD): There is critical ostial/proximal LAD stenosis, estimated at 95%, with heavy calcification. The remainder of the LAD is patent with mild irregularity. The first diagonal is moderate in caliber with 75% proximal stenosis.   Left circumflex (LCx): There is a large intermediate branch without significant stenosis. The AV circumflex has mild irregularity and supplies 2 OM branches.   Right coronary artery (RCA): Severe calcification. Total occlusion at the ostium. The distal branch vessels of the RCA fill from left-to-right collaterals.   Left ventriculography: the inferior wall is mildly  hypokinetic. The remaining LV wall segments contract normally with an LVEF of 55%  Aortic arch angiogram: heavy arch calcification. Heavy calcification at the origin of the left carotid and left subclavian.   Estimated Blood Loss: minimal  Final Conclusions:   1. Severe ostial LAD stenosis 2. Total occlusion of the RCA 3. Moderate left main stenosis 4. Patent left circumflex 5. Preserved LV systolic function  Recommendations: Hospital admission, TCTS consult for CABG, start IV heparin, high-risk coronary anatomy.   [name] MD, [facility] 05/19/2014, 4:58 PM

## 2014-05-20 ENCOUNTER — Inpatient Hospital Stay (HOSPITAL_COMMUNITY): Payer: Commercial Managed Care - HMO

## 2014-05-20 DIAGNOSIS — I209 Angina pectoris, unspecified: Secondary | ICD-10-CM

## 2014-05-20 DIAGNOSIS — I369 Nonrheumatic tricuspid valve disorder, unspecified: Secondary | ICD-10-CM

## 2014-05-20 DIAGNOSIS — I739 Peripheral vascular disease, unspecified: Secondary | ICD-10-CM

## 2014-05-20 LAB — BLOOD GAS, ARTERIAL
Acid-base deficit: 0.5 mmol/L (ref 0.0–2.0)
Bicarbonate: 23.5 mEq/L (ref 20.0–24.0)
Drawn by: 25708
FIO2: 0.21 %
O2 Saturation: 95.4 %
Patient temperature: 98
TCO2: 24.7 mmol/L (ref 0–100)
pCO2 arterial: 37.1 mmHg (ref 35.0–45.0)
pH, Arterial: 7.416 (ref 7.350–7.450)
pO2, Arterial: 79.1 mmHg — ABNORMAL LOW (ref 80.0–100.0)

## 2014-05-20 LAB — PULMONARY FUNCTION TEST
FEF 25-75 Post: 0.83 L/sec
FEF 25-75 Pre: 0.83 L/sec
FEF2575-%Change-Post: 0 %
FEF2575-%Pred-Post: 36 %
FEF2575-%Pred-Pre: 36 %
FEV1-%Change-Post: 0 %
FEV1-%Pred-Post: 57 %
FEV1-%Pred-Pre: 57 %
FEV1-Post: 1.82 L
FEV1-Pre: 1.81 L
FEV1FVC-%Change-Post: 8 %
FEV1FVC-%Pred-Pre: 88 %
FEV6-%Change-Post: -4 %
FEV6-%Pred-Post: 63 %
FEV6-%Pred-Pre: 66 %
FEV6-Post: 2.6 L
FEV6-Pre: 2.71 L
FEV6FVC-%Change-Post: 3 %
FEV6FVC-%Pred-Post: 104 %
FEV6FVC-%Pred-Pre: 101 %
FVC-%Change-Post: -7 %
FVC-%Pred-Post: 60 %
FVC-%Pred-Pre: 65 %
FVC-Post: 2.65 L
FVC-Pre: 2.85 L
Post FEV1/FVC ratio: 69 %
Post FEV6/FVC ratio: 98 %
Pre FEV1/FVC ratio: 63 %
Pre FEV6/FVC Ratio: 95 %

## 2014-05-20 LAB — GLUCOSE, CAPILLARY
Glucose-Capillary: 117 mg/dL — ABNORMAL HIGH (ref 70–99)
Glucose-Capillary: 250 mg/dL — ABNORMAL HIGH (ref 70–99)
Glucose-Capillary: 88 mg/dL (ref 70–99)
Glucose-Capillary: 133 mg/dL — ABNORMAL HIGH (ref 70–99)

## 2014-05-20 LAB — URINALYSIS, ROUTINE W REFLEX MICROSCOPIC
Bilirubin Urine: NEGATIVE
Glucose, UA: NEGATIVE mg/dL
Hgb urine dipstick: NEGATIVE
Ketones, ur: NEGATIVE mg/dL
Leukocytes, UA: NEGATIVE
Nitrite: NEGATIVE
Protein, ur: NEGATIVE mg/dL
Specific Gravity, Urine: 1.019 (ref 1.005–1.030)
Urobilinogen, UA: 0.2 mg/dL (ref 0.0–1.0)
pH: 6.5 (ref 5.0–8.0)

## 2014-05-20 LAB — BASIC METABOLIC PANEL
Anion gap: 9 (ref 5–15)
BUN: 18 mg/dL (ref 6–23)
CO2: 26 mEq/L (ref 19–32)
Calcium: 9.2 mg/dL (ref 8.4–10.5)
Chloride: 104 mEq/L (ref 96–112)
Creatinine, Ser: 1.19 mg/dL (ref 0.50–1.35)
GFR calc Af Amer: 67 mL/min — ABNORMAL LOW (ref >90)
GFR calc non Af Amer: 58 mL/min — ABNORMAL LOW (ref >90)
Glucose, Bld: 134 mg/dL — ABNORMAL HIGH (ref 70–99)
Potassium: 4.6 mEq/L (ref 3.7–5.3)
Sodium: 139 mEq/L (ref 137–147)

## 2014-05-20 LAB — CBC
HCT: 28.7 % — ABNORMAL LOW (ref 39.0–52.0)
Hemoglobin: 8.9 g/dL — ABNORMAL LOW (ref 13.0–17.0)
MCH: 27.1 pg (ref 26.0–34.0)
MCHC: 31 g/dL (ref 30.0–36.0)
MCV: 87.5 fL (ref 78.0–100.0)
Platelets: 200 10*3/uL (ref 150–400)
RBC: 3.28 MIL/uL — ABNORMAL LOW (ref 4.22–5.81)
RDW: 14 % (ref 11.5–15.5)
WBC: 4.8 10*3/uL (ref 4.0–10.5)

## 2014-05-20 LAB — HEMOGLOBIN A1C
Hgb A1c MFr Bld: 7.6 % — ABNORMAL HIGH (ref <5.7)
Mean Plasma Glucose: 171 mg/dL — ABNORMAL HIGH (ref <117)

## 2014-05-20 LAB — ABO/RH: ABO/RH(D): O POS

## 2014-05-20 LAB — SURGICAL PCR SCREEN
MRSA, PCR: NEGATIVE
Staphylococcus aureus: NEGATIVE

## 2014-05-20 LAB — HEPARIN LEVEL (UNFRACTIONATED)
Heparin Unfractionated: <0.1 IU/mL — ABNORMAL LOW (ref 0.30–0.70)
Heparin Unfractionated: 0.34 IU/mL (ref 0.30–0.70)

## 2014-05-20 MED ORDER — EPINEPHRINE HCL 1 MG/ML IJ SOLN
0.0000 ug/min | INTRAVENOUS | Status: DC
  Filled 2014-05-20: qty 4

## 2014-05-20 MED ORDER — TEMAZEPAM 15 MG PO CAPS: 15.0000 mg | ORAL_CAPSULE | Freq: Once | ORAL | Status: AC | PRN

## 2014-05-20 MED ORDER — SODIUM CHLORIDE 0.9 % IV SOLN
INTRAVENOUS | Status: AC
  Administered 2014-05-21: 14 mL/h via INTRAVENOUS
  Filled 2014-05-20: qty 40

## 2014-05-20 MED ORDER — CHLORHEXIDINE GLUCONATE CLOTH 2 % EX PADS
6.0000 | MEDICATED_PAD | Freq: Once | CUTANEOUS | Status: AC
  Administered 2014-05-21: 6 via TOPICAL

## 2014-05-20 MED ORDER — NITROGLYCERIN IN D5W 200-5 MCG/ML-% IV SOLN
2.0000 ug/min | INTRAVENOUS | Status: AC
  Administered 2014-05-21: 5 ug/min via INTRAVENOUS
  Filled 2014-05-20: qty 250

## 2014-05-20 MED ORDER — DEXTROSE 5 % IV SOLN
750.0000 mg | INTRAVENOUS | Status: DC
  Filled 2014-05-20 (×2): qty 750

## 2014-05-20 MED ORDER — DIAZEPAM 2 MG PO TABS
2.0000 mg | ORAL_TABLET | Freq: Once | ORAL | Status: AC
  Administered 2014-05-21: 2 mg via ORAL
  Filled 2014-05-20: qty 1

## 2014-05-20 MED ORDER — ALBUTEROL SULFATE (2.5 MG/3ML) 0.083% IN NEBU
2.5000 mg | INHALATION_SOLUTION | Freq: Once | RESPIRATORY_TRACT | Status: AC
  Administered 2014-05-20: 2.5 mg via RESPIRATORY_TRACT

## 2014-05-20 MED ORDER — METOPROLOL TARTRATE 12.5 MG HALF TABLET
12.5000 mg | ORAL_TABLET | Freq: Two times a day (BID) | ORAL | Status: DC
  Administered 2014-05-20: 12.5 mg via ORAL
  Filled 2014-05-20 (×4): qty 1

## 2014-05-20 MED ORDER — POTASSIUM CHLORIDE 2 MEQ/ML IV SOLN
80.0000 meq | INTRAVENOUS | Status: DC
  Filled 2014-05-20: qty 40

## 2014-05-20 MED ORDER — METOPROLOL TARTRATE 12.5 MG HALF TABLET
12.5000 mg | ORAL_TABLET | Freq: Once | ORAL | Status: AC
  Administered 2014-05-21: 12.5 mg via ORAL
  Filled 2014-05-20: qty 1

## 2014-05-20 MED ORDER — DOPAMINE-DEXTROSE 3.2-5 MG/ML-% IV SOLN
0.0000 ug/kg/min | INTRAVENOUS | Status: DC
  Filled 2014-05-20: qty 250

## 2014-05-20 MED ORDER — BISACODYL 5 MG PO TBEC: 5.0000 mg | DELAYED_RELEASE_TABLET | Freq: Once | ORAL | Status: DC

## 2014-05-20 MED ORDER — DEXMEDETOMIDINE HCL IN NACL 400 MCG/100ML IV SOLN
0.1000 ug/kg/h | INTRAVENOUS | Status: AC
  Administered 2014-05-21: 0.2 ug/kg/h via INTRAVENOUS
  Filled 2014-05-20: qty 100

## 2014-05-20 MED ORDER — DEXTROSE 5 % IV SOLN
1.5000 g | INTRAVENOUS | Status: AC
  Administered 2014-05-21: 1.5 g via INTRAVENOUS
  Administered 2014-05-21: .75 g via INTRAVENOUS
  Filled 2014-05-20: qty 1.5

## 2014-05-20 MED ORDER — CHLORHEXIDINE GLUCONATE CLOTH 2 % EX PADS
6.0000 | MEDICATED_PAD | Freq: Once | CUTANEOUS | Status: AC
  Administered 2014-05-20: 6 via TOPICAL

## 2014-05-20 MED ORDER — ISOSORBIDE DINITRATE 20 MG PO TABS
20.0000 mg | ORAL_TABLET | Freq: Two times a day (BID) | ORAL | Status: AC
  Administered 2014-05-20 (×2): 20 mg via ORAL
  Filled 2014-05-20 (×2): qty 1

## 2014-05-20 MED ORDER — PLASMA-LYTE 148 IV SOLN
INTRAVENOUS | Status: AC
  Administered 2014-05-21: 500 mL
  Filled 2014-05-20: qty 2.5

## 2014-05-20 MED ORDER — PHENYLEPHRINE HCL 10 MG/ML IJ SOLN
30.0000 ug/min | INTRAVENOUS | Status: AC
  Administered 2014-05-21: 20 ug/min via INTRAVENOUS
  Filled 2014-05-20: qty 2

## 2014-05-20 MED ORDER — SODIUM CHLORIDE 0.9 % IV SOLN
INTRAVENOUS | Status: AC
  Administered 2014-05-21: 1.8 [IU]/h via INTRAVENOUS
  Filled 2014-05-20 (×2): qty 2.5

## 2014-05-20 MED ORDER — MAGNESIUM SULFATE 50 % IJ SOLN
40.0000 meq | INTRAMUSCULAR | Status: DC
  Filled 2014-05-20: qty 10

## 2014-05-20 MED ORDER — SODIUM CHLORIDE 0.9 % IV SOLN
INTRAVENOUS | Status: DC
  Filled 2014-05-20: qty 30

## 2014-05-20 MED ORDER — VANCOMYCIN HCL 10 G IV SOLR
1250.0000 mg | INTRAVENOUS | Status: AC
  Administered 2014-05-21: 1250 mg via INTRAVENOUS
  Filled 2014-05-20: qty 1250

## 2014-05-20 NOTE — Progress Notes (Signed)
Subjective:  No chest pain  Objective:   Vital Signs in the last 24 hours: Temp:  [97.8 F (36.6 C)-98.6 F (37 C)] 97.8 F (36.6 C) (12/17 1121) Pulse Rate:  [45-55] 50 (12/17 1121) Resp:  [12-20] 18 (12/17 0325) BP: (117-191)/(28-72) 176/31 mmHg (12/17 1121) SpO2:  [97 %-100 %] 97 % (12/17 1121) Weight:  [166 lb 7.2 oz (75.5 kg)] 166 lb 7.2 oz (75.5 kg) (12/17 0653)  Intake/Output from previous day: 12/16 0701 - 12/17 0700 In: 681.7 [I.V.:681.7] Out: 725 [Urine:725]  Medications: . aspirin EC  81 mg Oral Daily  . bisacodyl  5 mg Oral Once  . Chlorhexidine Gluconate Cloth  6 each Topical Once   And  . Chlorhexidine Gluconate Cloth  6 each Topical Once  . [START ON 05/21/2014] diazepam  2 mg Oral Once  . glimepiride  4 mg Oral BID WC  . insulin aspart  0-15 Units Subcutaneous TID WC  . linagliptin  5 mg Oral Daily  . [START ON 05/21/2014] metoprolol tartrate  12.5 mg Oral Once  . simvastatin  40 mg Oral QPM  . sodium chloride  3 mL Intravenous Q12H    . heparin 1,200 Units/hr (05/20/14 1031)    Physical Exam:   General appearance: alert, cooperative and no distress Neck: Bilateral carotid bruits; no adenopathy, no JVD, supple, symmetrical, trachea midline and thyroid not enlarged, symmetric, no tenderness/mass/nodules Lungs: clear to auscultation bilaterally Heart: regular rate and rhythm 1/6 sem Abdomen: +abdominal bruit; soft, non-tender; bowel sounds normal; no masses,  no organomegaly Extremities: no edema, redness or tenderness in the calves or thighs Pulses: 2+ and symmetric Neurologic: Grossly normal   Rate: 61  Rhythm: sinus  ECG (independently read by me): SB with anterolateral/lateral STT changes.   Lab Results:   Recent Labs  05/18/14 1010 05/20/14 0800  NA 134* 139  K 4.5 4.6  CL 103 104  CO2 26 26  GLUCOSE 221* 134*  BUN 24* 18  CREATININE 1.3 1.19   CBC Latest Ref Rng 05/20/2014 05/19/2014 05/18/2014  WBC 4.0 - 10.5 K/uL 4.8 4.6  5.5  Hemoglobin 13.0 - 17.0 g/dL 8.9(L) 9.1(L) 9.8(L)  Hematocrit 39.0 - 52.0 % 28.7(L) 29.6(L) 30.4(L)  Platelets 150 - 400 K/uL 200 227 218.0     No results for input(s): TROPONINI in the last 72 hours.  Invalid input(s): CK, MB  Hepatic Function Panel No results for input(s): PROT, ALBUMIN, AST, ALT, ALKPHOS, BILITOT, BILIDIR, IBILI in the last 72 hours.  Recent Labs  05/18/14 1010  INR 1.1*   BNP (last 3 results) No results for input(s): PROBNP in the last 8760 hours.  Lipid Panel  No results found for: CHOL, TRIG, HDL, CHOLHDL, VLDL, LDLCALC, LDLDIRECT  Imaging:  No results found.   CARDIAC CATH Procedural Findings: 05/19/14 Hemodynamics: AO 139/45 LV 151/21  Coronary angiography: Coronary dominance: right  Left mainstem: There is 50% proximal stenosis. The left main divides into the LAD and LCx.   Left anterior descending (LAD): There is critical ostial/proximal LAD stenosis, estimated at 95%, with heavy calcification. The remainder of the LAD is patent with mild irregularity. The first diagonal is moderate in caliber with 75% proximal stenosis.   Left circumflex (LCx): There is a large intermediate branch without significant stenosis. The AV circumflex has mild irregularity and supplies 2 OM branches.   Right coronary artery (RCA): Severe calcification. Total occlusion at the ostium. The distal branch vessels of the RCA fill from left-to-right collaterals.  Left ventriculography: the inferior wall is mildly hypokinetic. The remaining LV wall segments contract normally with an LVEF of 55%  Aortic arch angiogram: heavy arch calcification. Heavy calcification at the origin of the left carotid and left subclavian.   Estimated Blood Loss: minimal  Final Conclusions:  1. Severe ostial LAD stenosis 2. Total occlusion of the RCA 3. Moderate left main stenosis 4. Patent left circumflex 5. Preserved LV systolic function  Assessment/Plan:   Active  Problems:   Ischemic chest pain  1. Unstable Angina on iv heparin.  2. CAD; no further cp but needs CABG; add nitrates, bb 3. PVD with Bilteral carotid bruits, abd bruit , RAS by doppler bilaterally  Would hold ACE-I/ARB until further evaluation 4. HLD 5. Type 2 DM 6. Anemia;  Will probable need eval with iron studies will not due pre-CABG tomorrow.  For CABG tomorrow.  Will add low dose BB, add nitrates.    Troy Sine, MD, [facility] 05/20/2014, 1:50 PM

## 2014-05-20 NOTE — Progress Notes (Signed)
Echocardiogram 2D Echocardiogram has been performed.  Justin Salinas 05/20/2014, 10:38 AM

## 2014-05-20 NOTE — Progress Notes (Addendum)
VASCULAR LAB PRELIMINARY  PRELIMINARY  PRELIMINARY  PRELIMINARY  Pre-op Cardiac Surgery  Carotid Findings:  Right:  1-39% ICA stenosis.  Left:  40-59% internal carotid artery stenosis.  High end of scale. Velocities are increased since study of 10-2012.   Acoustic shadowing may obscure higher velocities.  Right:  Vertebral artery flow is antegrade.  Left:  Vertebral artery waveform is abnormal (bunny sign).  Bilateral subclavian arteries appear biphasic.   Upper Extremity Right Left  Brachial Pressures 151 Triphasic  137  Triphasic   Radial Waveforms Triphasic  Triphasic  Ulnar Waveforms Triphasic  Triphasic  Palmar Arch (Allen's Test) Within normal limits  Within normal limits.     Lower  Extremity Right Left  Dorsalis Pedis Triphasic  187 Triphasic  187  Anterior Tibial    Posterior Tibial Triphasic  187 Triphasic  187   Ankle/Brachial Indices 1.24 1.25     Ancil Dewan, RVT 05/20/2014, 12:13 PM

## 2014-05-20 NOTE — Progress Notes (Signed)
ANTICOAGULATION CONSULT NOTE - Follow Up Consult  Pharmacy Consult for heparin Indication: chest pain/ACS  No Known Allergies  Patient Measurements: Height: 5\' 11"  (180.3 cm) Weight: 166 lb 7.2 oz (75.5 kg) IBW/kg (Calculated) : 75.3 Heparin Dosing Weight: 75.5kg  Vital Signs: Temp: 97.9 F (36.6 C) (12/17 1639) Temp Source: Oral (12/17 1639) BP: 130/46 mmHg (12/17 1639) Pulse Rate: 57 (12/17 1639)  Labs:  Recent Labs  05/18/14 1010 05/19/14 1200 05/20/14 0800 05/20/14 1830  HGB 9.8* 9.1* 8.9*  --   HCT 30.4* 29.6* 28.7*  --   PLT 218.0 227 200  --   LABPROT 12.2  --   --   --   INR 1.1*  --   --   --   HEPARINUNFRC  --   --  <0.10* 0.34  CREATININE 1.3  --  1.19  --     Estimated Creatinine Clearance: 57.1 mL/min (by C-G formula based on Cr of 1.19).   Medications:  Infusions:  . heparin 1,200 Units/hr (05/20/14 1031)    Assessment: 73 yo M s/p L heart cath found severe 2 vessel dx w/ mod L-main dx with plan for CABG tomorrow on IV heparin.   Heparin level this PM is therapeutic x1.  Goal of Therapy:  Heparin level 0.3-0.7 units/ml Monitor platelets by anticoagulation protocol: Yes   Plan:  Continue heparin gtt to 1200 units/hr   Daily HL/CBC or Post OHS Monitor s/sx of bleeding  Thank you for allowing pharmacy to be part of this patient's care team  Sloan Leiter, PharmD, BCPS Clinical Pharmacist [phone] 05/20/2014 .7:34 PM

## 2014-05-20 NOTE — Care Management Note (Addendum)
    Page 1 of 1   05/26/2014     4:56:05 PM CARE MANAGEMENT NOTE 05/26/2014  Patient:  Justin Salinas, Justin Salinas   Account Number:  [id number]  Date Initiated:  05/20/2014  Documentation initiated by:  Elissa Hefty  Subjective/Objective Assessment:   adm w ischemic chest pain, pos card cath, cvts eval     Action/Plan:   lives w wife, pcp dr Herbie [place] freid   Anticipated DC Date:  05/27/2014   Anticipated DC Plan:  HOME/SELF CARE         Choice offered to / List presented to:             Status of service:  Completed, signed off Medicare Important Message given?  YES (If response is "NO", the following Medicare IM given date fields will be blank) Date Medicare IM given:  05/25/2014 Medicare IM given by:  Marvetta Gibbons Date Additional Medicare IM given:   Additional Medicare IM given by:    Discharge Disposition:  HOME/SELF CARE  Per UR Regulation:  Reviewed for med. necessity/level of care/duration of stay  If discussed at [place] of Stay Meetings, dates discussed:    Comments:  ContactSandford Craze [id number]  [phone]   05-24-14 8:10am Luz Lex, RNBSN [phone] Post op CABG x4 on 12-18.  Fluid overload - initiated lasix.

## 2014-05-20 NOTE — Progress Notes (Signed)
1 Day Post-Op Procedure(s) (LRB): LEFT HEART CATHETERIZATION WITH CORONARY ANGIOGRAM (N/A) Subjective: No issues overnight  Objective: Vital signs in last 24 hours: Temp:  [97.8 F (36.6 C)-98.6 F (37 C)] 97.8 F (36.6 C) (12/17 1121) Pulse Rate:  [45-55] 50 (12/17 1121) Cardiac Rhythm:  [-] Sinus bradycardia (12/17 0800) Resp:  [12-20] 18 (12/17 0325) BP: (117-191)/(28-72) 176/31 mmHg (12/17 1121) SpO2:  [97 %-100 %] 97 % (12/17 1121) Weight:  [166 lb 7.2 oz (75.5 kg)] 166 lb 7.2 oz (75.5 kg) (12/17 0653)  Hemodynamic parameters for last 24 hours:    Intake/Output from previous day: 12/16 0701 - 12/17 0700 In: 681.7 [I.V.:681.7] Out: 725 [Urine:725] Intake/Output this shift: Total I/O In: 291.2 [P.O.:240; I.V.:51.2] Out: 400 [Urine:400]  General appearance: alert and no distress Neurologic: intact Heart: regular rate and rhythm  Lab Results:  Recent Labs  05/19/14 1200 05/20/14 0800  WBC 4.6 4.8  HGB 9.1* 8.9*  HCT 29.6* 28.7*  PLT 227 200   BMET:  Recent Labs  05/18/14 1010 05/20/14 0800  NA 134* 139  K 4.5 4.6  CL 103 104  CO2 26 26  GLUCOSE 221* 134*  BUN 24* 18  CREATININE 1.3 1.19  CALCIUM 9.3 9.2    PT/INR:  Recent Labs  05/18/14 1010  LABPROT 12.2  INR 1.1*   ABG    Component Value Date/Time   PHART 7.416 05/20/2014 0910   HCO3 23.5 05/20/2014 0910   TCO2 24.7 05/20/2014 0910   ACIDBASEDEF 0.5 05/20/2014 0910   O2SAT 95.4 05/20/2014 0910   CBG (last 3)   Recent Labs  05/19/14 2124 05/20/14 0749 05/20/14 1124  GLUCAP 372* 117* 250*    Assessment/Plan: S/P Procedure(s) (LRB): LEFT HEART CATHETERIZATION WITH CORONARY ANGIOGRAM (N/A) -  Left main/ severe 2 vessel CAD for CABG tomorrow  Carotid duplex shows a [id number]- [id number] stenosis in LICA- on indication for intervention  CABG in AM, all questions answered   LOS: 1 day    Justin Salinas C 05/20/2014

## 2014-05-20 NOTE — Progress Notes (Signed)
ANTICOAGULATION CONSULT NOTE - Follow Up Consult  Pharmacy Consult for heparin Indication: chest pain/ACS  No Known Allergies  Patient Measurements: Height: 5\' 11"  (180.3 cm) Weight: 166 lb 7.2 oz (75.5 kg) IBW/kg (Calculated) : 75.3 Heparin Dosing Weight: 75.5kg  Vital Signs: Temp: 98 F (36.7 C) (12/17 0700) Temp Source: Oral (12/17 0700) BP: 172/51 mmHg (12/17 0805) Pulse Rate: 52 (12/17 0700)  Labs:  Recent Labs  05/18/14 1010 05/19/14 1200 05/20/14 0800  HGB 9.8* 9.1* 8.9*  HCT 30.4* 29.6* 28.7*  PLT 218.0 227 200  LABPROT 12.2  --   --   INR 1.1*  --   --   CREATININE 1.3  --  1.19    Estimated Creatinine Clearance: 57.1 mL/min (by C-G formula based on Cr of 1.19).   Medications:  Infusions:  . heparin 950 Units/hr (05/20/14 0700)    Assessment: 73 yo M s/p L heart cath found severe 2 vessel dx w/ mod L-main dx. Plan for CABG Friday, pharmacy consulted to start heparin 4 hours post removal of R-radial TR band. Removed at 1850. No bolus was given due to recent procedure. Heparin gtt started at 950units/hr. 6hr heparin level undetectable. Hgb/Hct low, stable. Plts wnl. No bleeding noted.   Goal of Therapy:  Heparin level 0.3-0.7 units/ml Monitor platelets by anticoagulation protocol: Yes   Plan:  Increase heparin gtt to 1200 units/hr  8hr HL @ 1830 Daily HL/CBC Monitor s/sx of bleeding  Thank you for allowing pharmacy to be part of this patient's care team  [place], Pharm.D Clinical Pharmacy Resident Pager: [phone] 05/20/2014 .9:36 AM

## 2014-05-20 NOTE — Progress Notes (Signed)
[id number] Discussed sternal precautions and demonstrated to pt how to get up and down without use of arms. Pt can get 2010ml on IS but had a little difficulty doing it correctly as he tried to take several breaths without letting it go to bottom. Discussed correct way to use. Pt stated significant other would be with him at discharge for 24/7. He had [facility] booklet. Gave care guide and wrote down how they can view pre op video later. Pt asking about CRP 2 as his brother is in it now. Discussed with pt what the program included and gave brochure so his family could check insurance coverage. Pt wants to attend [place] if insurance will cover. We will follow up after surgery. Graylon Good RN BSN 05/20/2014 11:48 AM

## 2014-05-21 ENCOUNTER — Inpatient Hospital Stay (HOSPITAL_COMMUNITY): Payer: Commercial Managed Care - HMO | Admitting: Anesthesiology

## 2014-05-21 ENCOUNTER — Encounter (HOSPITAL_COMMUNITY): Payer: Self-pay | Admitting: Anesthesiology

## 2014-05-21 ENCOUNTER — Inpatient Hospital Stay (HOSPITAL_COMMUNITY): Payer: Commercial Managed Care - HMO

## 2014-05-21 ENCOUNTER — Encounter (HOSPITAL_COMMUNITY)
Admission: RE | Disposition: A | Discharge status: Home / Self Care | Payer: Commercial Managed Care - HMO | Source: Ambulatory Visit | Attending: Thoracic Surgery (Cardiothoracic Vascular Surgery)

## 2014-05-21 DIAGNOSIS — Z951 Presence of aortocoronary bypass graft: Secondary | ICD-10-CM

## 2014-05-21 DIAGNOSIS — I25110 Atherosclerotic heart disease of native coronary artery with unstable angina pectoris: Secondary | ICD-10-CM

## 2014-05-21 HISTORY — PX: INTRAOPERATIVE TRANSESOPHAGEAL ECHOCARDIOGRAM: SHX5062

## 2014-05-21 HISTORY — PX: CORONARY ARTERY BYPASS GRAFT: SHX141

## 2014-05-21 LAB — POCT I-STAT 4, (NA,K, GLUC, HGB,HCT)
Glucose, Bld: 98 mg/dL (ref 70–99)
HCT: 31 % — ABNORMAL LOW (ref 39.0–52.0)
Hemoglobin: 10.5 g/dL — ABNORMAL LOW (ref 13.0–17.0)
Potassium: 4.5 mEq/L (ref 3.7–5.3)
Sodium: 141 mEq/L (ref 137–147)

## 2014-05-21 LAB — COMPREHENSIVE METABOLIC PANEL
ALT: 14 U/L (ref 0–53)
AST: 14 U/L (ref 0–37)
Albumin: 3 g/dL — ABNORMAL LOW (ref 3.5–5.2)
Alkaline Phosphatase: 49 U/L (ref 39–117)
Anion gap: 11 (ref 5–15)
BUN: 17 mg/dL (ref 6–23)
CO2: 27 mEq/L (ref 19–32)
Calcium: 8.8 mg/dL (ref 8.4–10.5)
Chloride: 104 mEq/L (ref 96–112)
Creatinine, Ser: 1.24 mg/dL (ref 0.50–1.35)
GFR calc Af Amer: 64 mL/min — ABNORMAL LOW (ref >90)
GFR calc non Af Amer: 55 mL/min — ABNORMAL LOW (ref >90)
Glucose, Bld: 90 mg/dL (ref 70–99)
Potassium: 4.3 mEq/L (ref 3.7–5.3)
Sodium: 142 mEq/L (ref 137–147)
Total Bilirubin: 0.3 mg/dL (ref 0.3–1.2)
Total Protein: 5.7 g/dL — ABNORMAL LOW (ref 6.0–8.3)

## 2014-05-21 LAB — CBC
HCT: 26.6 % — ABNORMAL LOW (ref 39.0–52.0)
HCT: 32.1 % — ABNORMAL LOW (ref 39.0–52.0)
HCT: 29.1 % — ABNORMAL LOW (ref 39.0–52.0)
Hemoglobin: 8.4 g/dL — ABNORMAL LOW (ref 13.0–17.0)
Hemoglobin: 10.3 g/dL — ABNORMAL LOW (ref 13.0–17.0)
Hemoglobin: 9.3 g/dL — ABNORMAL LOW (ref 13.0–17.0)
MCH: 27.4 pg (ref 26.0–34.0)
MCH: 27.5 pg (ref 26.0–34.0)
MCH: 27.6 pg (ref 26.0–34.0)
MCHC: 31.6 g/dL (ref 30.0–36.0)
MCHC: 32.1 g/dL (ref 30.0–36.0)
MCHC: 32 g/dL (ref 30.0–36.0)
MCV: 86.6 fL (ref 78.0–100.0)
MCV: 85.8 fL (ref 78.0–100.0)
MCV: 86.4 fL (ref 78.0–100.0)
Platelets: 203 10*3/uL (ref 150–400)
Platelets: 102 10*3/uL — ABNORMAL LOW (ref 150–400)
Platelets: 110 10*3/uL — ABNORMAL LOW (ref 150–400)
RBC: 3.07 MIL/uL — ABNORMAL LOW (ref 4.22–5.81)
RBC: 3.74 MIL/uL — ABNORMAL LOW (ref 4.22–5.81)
RBC: 3.37 MIL/uL — ABNORMAL LOW (ref 4.22–5.81)
RDW: 13.9 % (ref 11.5–15.5)
RDW: 13.9 % (ref 11.5–15.5)
RDW: 14.2 % (ref 11.5–15.5)
WBC: 5.7 10*3/uL (ref 4.0–10.5)
WBC: 6.8 10*3/uL (ref 4.0–10.5)
WBC: 6.5 10*3/uL (ref 4.0–10.5)

## 2014-05-21 LAB — POCT I-STAT, CHEM 8
BUN: 14 mg/dL (ref 6–23)
BUN: 13 mg/dL (ref 6–23)
BUN: 12 mg/dL (ref 6–23)
BUN: 13 mg/dL (ref 6–23)
BUN: 12 mg/dL (ref 6–23)
BUN: 14 mg/dL (ref 6–23)
Calcium, Ion: 1.23 mmol/L (ref 1.13–1.30)
Calcium, Ion: 1.24 mmol/L (ref 1.13–1.30)
Calcium, Ion: 1.04 mmol/L — ABNORMAL LOW (ref 1.13–1.30)
Calcium, Ion: 1.11 mmol/L — ABNORMAL LOW (ref 1.13–1.30)
Calcium, Ion: 1.11 mmol/L — ABNORMAL LOW (ref 1.13–1.30)
Calcium, Ion: 1.18 mmol/L (ref 1.13–1.30)
Chloride: 105 mEq/L (ref 96–112)
Chloride: 104 mEq/L (ref 96–112)
Chloride: 100 mEq/L (ref 96–112)
Chloride: 104 mEq/L (ref 96–112)
Chloride: 105 mEq/L (ref 96–112)
Chloride: 107 mEq/L (ref 96–112)
Creatinine, Ser: 1.2 mg/dL (ref 0.50–1.35)
Creatinine, Ser: 1 mg/dL (ref 0.50–1.35)
Creatinine, Ser: 1 mg/dL (ref 0.50–1.35)
Creatinine, Ser: 1.2 mg/dL (ref 0.50–1.35)
Creatinine, Ser: 1 mg/dL (ref 0.50–1.35)
Creatinine, Ser: 1.3 mg/dL (ref 0.50–1.35)
Glucose, Bld: 119 mg/dL — ABNORMAL HIGH (ref 70–99)
Glucose, Bld: 113 mg/dL — ABNORMAL HIGH (ref 70–99)
Glucose, Bld: 99 mg/dL (ref 70–99)
Glucose, Bld: 110 mg/dL — ABNORMAL HIGH (ref 70–99)
Glucose, Bld: 108 mg/dL — ABNORMAL HIGH (ref 70–99)
Glucose, Bld: 162 mg/dL — ABNORMAL HIGH (ref 70–99)
HCT: 24 % — ABNORMAL LOW (ref 39.0–52.0)
HCT: 23 % — ABNORMAL LOW (ref 39.0–52.0)
HCT: 20 % — ABNORMAL LOW (ref 39.0–52.0)
HCT: 24 % — ABNORMAL LOW (ref 39.0–52.0)
HCT: 21 % — ABNORMAL LOW (ref 39.0–52.0)
HCT: 29 % — ABNORMAL LOW (ref 39.0–52.0)
Hemoglobin: 8.2 g/dL — ABNORMAL LOW (ref 13.0–17.0)
Hemoglobin: 7.8 g/dL — ABNORMAL LOW (ref 13.0–17.0)
Hemoglobin: 6.8 g/dL — CL (ref 13.0–17.0)
Hemoglobin: 8.2 g/dL — ABNORMAL LOW (ref 13.0–17.0)
Hemoglobin: 7.1 g/dL — ABNORMAL LOW (ref 13.0–17.0)
Hemoglobin: 9.9 g/dL — ABNORMAL LOW (ref 13.0–17.0)
Potassium: 4.4 mEq/L (ref 3.7–5.3)
Potassium: 4.4 mEq/L (ref 3.7–5.3)
Potassium: 4.8 mEq/L (ref 3.7–5.3)
Potassium: 6.2 mEq/L — ABNORMAL HIGH (ref 3.7–5.3)
Potassium: 5.2 mEq/L (ref 3.7–5.3)
Potassium: 4.9 mEq/L (ref 3.7–5.3)
Sodium: 139 mEq/L (ref 137–147)
Sodium: 140 mEq/L (ref 137–147)
Sodium: 135 mEq/L — ABNORMAL LOW (ref 137–147)
Sodium: 136 mEq/L — ABNORMAL LOW (ref 137–147)
Sodium: 137 mEq/L (ref 137–147)
Sodium: 141 mEq/L (ref 137–147)
TCO2: 25 mmol/L (ref 0–100)
TCO2: 26 mmol/L (ref 0–100)
TCO2: 24 mmol/L (ref 0–100)
TCO2: 23 mmol/L (ref 0–100)
TCO2: 23 mmol/L (ref 0–100)
TCO2: 22 mmol/L (ref 0–100)

## 2014-05-21 LAB — POCT I-STAT 3, ART BLOOD GAS (G3+)
Acid-base deficit: 1 mmol/L (ref 0.0–2.0)
Acid-base deficit: 3 mmol/L — ABNORMAL HIGH (ref 0.0–2.0)
Acid-base deficit: 3 mmol/L — ABNORMAL HIGH (ref 0.0–2.0)
Bicarbonate: 25.2 mEq/L — ABNORMAL HIGH (ref 20.0–24.0)
Bicarbonate: 23.4 mEq/L (ref 20.0–24.0)
Bicarbonate: 22.5 mEq/L (ref 20.0–24.0)
Bicarbonate: 23.1 mEq/L (ref 20.0–24.0)
O2 Saturation: 100 %
O2 Saturation: 97 %
O2 Saturation: 98 %
O2 Saturation: 95 %
Patient temperature: 35.7
Patient temperature: 36.9
Patient temperature: 36.6
TCO2: 26 mmol/L (ref 0–100)
TCO2: 25 mmol/L (ref 0–100)
TCO2: 24 mmol/L (ref 0–100)
TCO2: 24 mmol/L (ref 0–100)
pCO2 arterial: 40.1 mmHg (ref 35.0–45.0)
pCO2 arterial: 36.7 mmHg (ref 35.0–45.0)
pCO2 arterial: 42.2 mmHg (ref 35.0–45.0)
pCO2 arterial: 44.1 mmHg (ref 35.0–45.0)
pH, Arterial: 7.407 (ref 7.350–7.450)
pH, Arterial: 7.407 (ref 7.350–7.450)
pH, Arterial: 7.335 — ABNORMAL LOW (ref 7.350–7.450)
pH, Arterial: 7.325 — ABNORMAL LOW (ref 7.350–7.450)
pO2, Arterial: 292 mmHg — ABNORMAL HIGH (ref 80.0–100.0)
pO2, Arterial: 81 mmHg (ref 80.0–100.0)
pO2, Arterial: 115 mmHg — ABNORMAL HIGH (ref 80.0–100.0)
pO2, Arterial: 80 mmHg (ref 80.0–100.0)

## 2014-05-21 LAB — CREATININE, SERUM
Creatinine, Ser: 1.23 mg/dL (ref 0.50–1.35)
GFR calc Af Amer: 65 mL/min — ABNORMAL LOW (ref >90)
GFR calc non Af Amer: 56 mL/min — ABNORMAL LOW (ref >90)

## 2014-05-21 LAB — GLUCOSE, CAPILLARY
Glucose-Capillary: 103 mg/dL — ABNORMAL HIGH (ref 70–99)
Glucose-Capillary: 92 mg/dL (ref 70–99)
Glucose-Capillary: 115 mg/dL — ABNORMAL HIGH (ref 70–99)
Glucose-Capillary: 138 mg/dL — ABNORMAL HIGH (ref 70–99)
Glucose-Capillary: 152 mg/dL — ABNORMAL HIGH (ref 70–99)
Glucose-Capillary: 153 mg/dL — ABNORMAL HIGH (ref 70–99)
Glucose-Capillary: 149 mg/dL — ABNORMAL HIGH (ref 70–99)

## 2014-05-21 LAB — APTT: aPTT: 36 seconds (ref 24–37)

## 2014-05-21 LAB — PROTIME-INR
INR: 1.43 (ref 0.00–1.49)
Prothrombin Time: 17.6 seconds — ABNORMAL HIGH (ref 11.6–15.2)

## 2014-05-21 LAB — HEMOGLOBIN AND HEMATOCRIT, BLOOD
HCT: 24.2 % — ABNORMAL LOW (ref 39.0–52.0)
Hemoglobin: 7.7 g/dL — ABNORMAL LOW (ref 13.0–17.0)

## 2014-05-21 LAB — MAGNESIUM: Magnesium: 3.4 mg/dL — ABNORMAL HIGH (ref 1.5–2.5)

## 2014-05-21 LAB — POCT I-STAT GLUCOSE
Glucose, Bld: 108 mg/dL — ABNORMAL HIGH (ref 70–99)
Operator id: 3406

## 2014-05-21 LAB — PLATELET COUNT: Platelets: 133 10*3/uL — ABNORMAL LOW (ref 150–400)

## 2014-05-21 LAB — HEPARIN LEVEL (UNFRACTIONATED): Heparin Unfractionated: 0.28 IU/mL — ABNORMAL LOW (ref 0.30–0.70)

## 2014-05-21 SURGERY — CORONARY ARTERY BYPASS GRAFTING (CABG)
Anesthesia: General | Site: Chest

## 2014-05-21 MED ORDER — METOPROLOL TARTRATE 12.5 MG HALF TABLET
12.5000 mg | ORAL_TABLET | Freq: Two times a day (BID) | ORAL | Status: DC
  Administered 2014-05-21: 12.5 mg via ORAL
  Filled 2014-05-21 (×3): qty 1

## 2014-05-21 MED ORDER — ASPIRIN 81 MG PO CHEW
324.0000 mg | CHEWABLE_TABLET | Freq: Every day | ORAL | Status: DC
  Administered 2014-05-22: 324 mg
  Filled 2014-05-21: qty 4

## 2014-05-21 MED ORDER — MIDAZOLAM HCL 5 MG/5ML IJ SOLN
INTRAMUSCULAR | Status: DC | PRN
  Administered 2014-05-21: 1 mg via INTRAVENOUS
  Administered 2014-05-21: 2 mg via INTRAVENOUS
  Administered 2014-05-21: 1 mg via INTRAVENOUS
  Administered 2014-05-21: 2 mg via INTRAVENOUS
  Administered 2014-05-21: 4 mg via INTRAVENOUS

## 2014-05-21 MED ORDER — SODIUM CHLORIDE 0.9 % IJ SOLN
3.0000 mL | Freq: Two times a day (BID) | INTRAMUSCULAR | Status: DC
  Administered 2014-05-22 – 2014-05-24 (×5): 3 mL via INTRAVENOUS

## 2014-05-21 MED ORDER — FENTANYL CITRATE 0.05 MG/ML IJ SOLN
INTRAMUSCULAR | Status: AC
  Filled 2014-05-21: qty 5

## 2014-05-21 MED ORDER — ACETAMINOPHEN 650 MG RE SUPP
650.0000 mg | Freq: Once | RECTAL | Status: AC
  Administered 2014-05-21: 650 mg via RECTAL

## 2014-05-21 MED ORDER — METOPROLOL TARTRATE 1 MG/ML IV SOLN
2.5000 mg | INTRAVENOUS | Status: DC | PRN
Start: 2014-05-21 — End: 2014-05-24
  Administered 2014-05-22: 5 mg via INTRAVENOUS
  Filled 2014-05-21: qty 5

## 2014-05-21 MED ORDER — ONDANSETRON HCL 4 MG/2ML IJ SOLN
4.0000 mg | Freq: Four times a day (QID) | INTRAMUSCULAR | Status: DC | PRN
  Administered 2014-05-22: 4 mg via INTRAVENOUS
  Filled 2014-05-21: qty 2

## 2014-05-21 MED ORDER — ALBUMIN HUMAN 5 % IV SOLN
250.0000 mL | INTRAVENOUS | Status: AC | PRN
  Administered 2014-05-21 (×3): 250 mL via INTRAVENOUS
  Filled 2014-05-21: qty 250

## 2014-05-21 MED ORDER — DEXTROSE 5 % IV SOLN
1.5000 g | Freq: Two times a day (BID) | INTRAVENOUS | Status: AC
  Administered 2014-05-21 – 2014-05-23 (×4): 1.5 g via INTRAVENOUS
  Filled 2014-05-21 (×4): qty 1.5

## 2014-05-21 MED ORDER — PANTOPRAZOLE SODIUM 40 MG PO TBEC
40.0000 mg | DELAYED_RELEASE_TABLET | Freq: Every day | ORAL | Status: DC
  Administered 2014-05-23 – 2014-05-25 (×3): 40 mg via ORAL
  Filled 2014-05-21 (×3): qty 1

## 2014-05-21 MED ORDER — POTASSIUM CHLORIDE 10 MEQ/50ML IV SOLN: 10.0000 meq | INTRAVENOUS | Status: AC

## 2014-05-21 MED ORDER — MIDAZOLAM HCL 10 MG/2ML IJ SOLN
INTRAMUSCULAR | Status: AC
  Filled 2014-05-21: qty 2

## 2014-05-21 MED ORDER — HEMOSTATIC AGENTS (NO CHARGE) OPTIME
TOPICAL | Status: DC | PRN
  Administered 2014-05-21: 1 via TOPICAL

## 2014-05-21 MED ORDER — PROPOFOL 10 MG/ML IV BOLUS
INTRAVENOUS | Status: AC
  Filled 2014-05-21: qty 20

## 2014-05-21 MED ORDER — PROTAMINE SULFATE 10 MG/ML IV SOLN
INTRAVENOUS | Status: DC | PRN
  Administered 2014-05-21: 250 mg via INTRAVENOUS

## 2014-05-21 MED ORDER — GLYCOPYRROLATE 0.2 MG/ML IJ SOLN
INTRAMUSCULAR | Status: DC | PRN
  Administered 2014-05-21 (×2): 0.2 mg via INTRAVENOUS

## 2014-05-21 MED ORDER — INSULIN REGULAR BOLUS VIA INFUSION
0.0000 [IU] | Freq: Three times a day (TID) | INTRAVENOUS | Status: DC
  Filled 2014-05-21: qty 10

## 2014-05-21 MED ORDER — LACTATED RINGERS IV SOLN
INTRAVENOUS | Status: DC | PRN
  Administered 2014-05-21: 07:00:00 via INTRAVENOUS

## 2014-05-21 MED ORDER — DEXMEDETOMIDINE HCL IN NACL 200 MCG/50ML IV SOLN: 0.0000 ug/kg/h | INTRAVENOUS | Status: DC

## 2014-05-21 MED ORDER — ROCURONIUM BROMIDE 50 MG/5ML IV SOLN
INTRAVENOUS | Status: AC
  Filled 2014-05-21: qty 1

## 2014-05-21 MED ORDER — LACTATED RINGERS IV SOLN
INTRAVENOUS | Status: DC | PRN
  Administered 2014-05-21: 06:00:00 via INTRAVENOUS

## 2014-05-21 MED ORDER — CETYLPYRIDINIUM CHLORIDE 0.05 % MT LIQD
7.0000 mL | Freq: Two times a day (BID) | OROMUCOSAL | Status: DC
  Administered 2014-05-21 – 2014-05-24 (×5): 7 mL via OROMUCOSAL

## 2014-05-21 MED ORDER — ARTIFICIAL TEARS OP OINT
TOPICAL_OINTMENT | OPHTHALMIC | Status: DC | PRN
  Administered 2014-05-21: 1 via OPHTHALMIC

## 2014-05-21 MED ORDER — TRAMADOL HCL 50 MG PO TABS: 50.0000 mg | ORAL_TABLET | ORAL | Status: DC | PRN

## 2014-05-21 MED ORDER — MAGNESIUM SULFATE 4 GM/100ML IV SOLN
4.0000 g | Freq: Once | INTRAVENOUS | Status: AC
  Administered 2014-05-21: 4 g via INTRAVENOUS
  Filled 2014-05-21: qty 100

## 2014-05-21 MED ORDER — MORPHINE SULFATE 2 MG/ML IJ SOLN: 1.0000 mg | INTRAMUSCULAR | Status: AC | PRN

## 2014-05-21 MED ORDER — AMINOCAPROIC ACID 250 MG/ML IV SOLN
INTRAVENOUS | Status: DC | PRN
  Administered 2014-05-21: 5 g via INTRAVENOUS

## 2014-05-21 MED ORDER — SODIUM CHLORIDE 0.9 % IJ SOLN: 3.0000 mL | INTRAMUSCULAR | Status: DC | PRN

## 2014-05-21 MED ORDER — HEPARIN SODIUM (PORCINE) 1000 UNIT/ML IJ SOLN
INTRAMUSCULAR | Status: AC
  Filled 2014-05-21: qty 1

## 2014-05-21 MED ORDER — ASPIRIN EC 325 MG PO TBEC
325.0000 mg | DELAYED_RELEASE_TABLET | Freq: Every day | ORAL | Status: DC
  Administered 2014-05-23 – 2014-05-26 (×4): 325 mg via ORAL
  Filled 2014-05-21 (×5): qty 1

## 2014-05-21 MED ORDER — SODIUM CHLORIDE 0.9 % IV SOLN
INTRAVENOUS | Status: DC
  Administered 2014-05-21: 20 mL/h via INTRAVENOUS

## 2014-05-21 MED ORDER — OXYCODONE HCL 5 MG PO TABS
5.0000 mg | ORAL_TABLET | ORAL | Status: DC | PRN
  Administered 2014-05-22 – 2014-05-23 (×3): 10 mg via ORAL
  Filled 2014-05-21 (×3): qty 2

## 2014-05-21 MED ORDER — ROCURONIUM BROMIDE 100 MG/10ML IV SOLN
INTRAVENOUS | Status: DC | PRN
  Administered 2014-05-21: 50 mg via INTRAVENOUS
  Administered 2014-05-21: 20 mg via INTRAVENOUS
  Administered 2014-05-21: 50 mg via INTRAVENOUS
  Administered 2014-05-21: 20 mg via INTRAVENOUS
  Administered 2014-05-21: 10 mg via INTRAVENOUS

## 2014-05-21 MED ORDER — BISACODYL 10 MG RE SUPP
10.0000 mg | Freq: Every day | RECTAL | Status: DC
  Administered 2014-05-25: 10 mg via RECTAL
  Filled 2014-05-21: qty 1

## 2014-05-21 MED ORDER — BISACODYL 5 MG PO TBEC
10.0000 mg | DELAYED_RELEASE_TABLET | Freq: Every day | ORAL | Status: DC
  Administered 2014-05-22 – 2014-05-24 (×3): 10 mg via ORAL
  Filled 2014-05-21 (×3): qty 2

## 2014-05-21 MED ORDER — MIDAZOLAM HCL 2 MG/2ML IJ SOLN: 2.0000 mg | INTRAMUSCULAR | Status: DC | PRN

## 2014-05-21 MED ORDER — PROPOFOL 10 MG/ML IV BOLUS
INTRAVENOUS | Status: DC | PRN
  Administered 2014-05-21: 100 mg via INTRAVENOUS

## 2014-05-21 MED ORDER — SODIUM CHLORIDE 0.9 % IV SOLN
INTRAVENOUS | Status: DC
  Administered 2014-05-21: 0.4 [IU]/h via INTRAVENOUS
  Filled 2014-05-21: qty 2.5

## 2014-05-21 MED ORDER — LACTATED RINGERS IV SOLN
INTRAVENOUS | Status: DC
  Administered 2014-05-21: 20 mL/h via INTRAVENOUS

## 2014-05-21 MED ORDER — SODIUM CHLORIDE 0.9 % IJ SOLN
OROMUCOSAL | Status: DC | PRN
  Administered 2014-05-21 (×3): 1000 mL via TOPICAL

## 2014-05-21 MED ORDER — GLYCOPYRROLATE 0.2 MG/ML IJ SOLN
INTRAMUSCULAR | Status: AC
  Filled 2014-05-21: qty 1

## 2014-05-21 MED ORDER — NITROGLYCERIN IN D5W 200-5 MCG/ML-% IV SOLN
0.0000 ug/min | INTRAVENOUS | Status: DC
  Administered 2014-05-22: 100 ug/min via INTRAVENOUS
  Filled 2014-05-21 (×2): qty 250

## 2014-05-21 MED ORDER — LACTATED RINGERS IV SOLN: 500.0000 mL | Freq: Once | INTRAVENOUS | Status: AC | PRN

## 2014-05-21 MED ORDER — ACETAMINOPHEN 160 MG/5ML PO SOLN
1000.0000 mg | Freq: Four times a day (QID) | ORAL | Status: DC
  Filled 2014-05-21: qty 40

## 2014-05-21 MED ORDER — METOPROLOL TARTRATE 25 MG/10 ML ORAL SUSPENSION
12.5000 mg | Freq: Two times a day (BID) | ORAL | Status: DC
  Filled 2014-05-21 (×3): qty 5

## 2014-05-21 MED ORDER — HEPARIN SODIUM (PORCINE) 1000 UNIT/ML IJ SOLN
INTRAMUSCULAR | Status: DC | PRN
  Administered 2014-05-21: 26000 [IU] via INTRAVENOUS

## 2014-05-21 MED ORDER — ACETAMINOPHEN 160 MG/5ML PO SOLN: 650.0000 mg | Freq: Once | ORAL | Status: AC

## 2014-05-21 MED ORDER — SODIUM CHLORIDE 0.45 % IV SOLN
INTRAVENOUS | Status: DC
  Administered 2014-05-21: 20 mL/h via INTRAVENOUS

## 2014-05-21 MED ORDER — ROCURONIUM BROMIDE 50 MG/5ML IV SOLN
INTRAVENOUS | Status: AC
  Filled 2014-05-21: qty 2

## 2014-05-21 MED ORDER — ACETAMINOPHEN 500 MG PO TABS
1000.0000 mg | ORAL_TABLET | Freq: Four times a day (QID) | ORAL | Status: DC
  Administered 2014-05-21 – 2014-05-26 (×18): 1000 mg via ORAL
  Filled 2014-05-21 (×21): qty 2

## 2014-05-21 MED ORDER — SODIUM CHLORIDE 0.9 % IV SOLN: 250.0000 mL | INTRAVENOUS | Status: DC

## 2014-05-21 MED ORDER — ALBUMIN HUMAN 5 % IV SOLN
INTRAVENOUS | Status: DC | PRN
  Administered 2014-05-21: 12:00:00 via INTRAVENOUS

## 2014-05-21 MED ORDER — PHENYLEPHRINE HCL 10 MG/ML IJ SOLN
0.0000 ug/min | INTRAVENOUS | Status: DC
  Filled 2014-05-21: qty 2

## 2014-05-21 MED ORDER — FENTANYL CITRATE 0.05 MG/ML IJ SOLN
INTRAMUSCULAR | Status: DC | PRN
  Administered 2014-05-21 (×2): 200 ug via INTRAVENOUS
  Administered 2014-05-21: 250 ug via INTRAVENOUS
  Administered 2014-05-21: 100 ug via INTRAVENOUS
  Administered 2014-05-21: 250 ug via INTRAVENOUS
  Administered 2014-05-21: 500 ug via INTRAVENOUS
  Administered 2014-05-21 (×2): 100 ug via INTRAVENOUS
  Administered 2014-05-21: 50 ug via INTRAVENOUS

## 2014-05-21 MED ORDER — FAMOTIDINE IN NACL 20-0.9 MG/50ML-% IV SOLN
20.0000 mg | Freq: Two times a day (BID) | INTRAVENOUS | Status: AC
  Administered 2014-05-21: 20 mg via INTRAVENOUS

## 2014-05-21 MED ORDER — 0.9 % SODIUM CHLORIDE (POUR BTL) OPTIME
TOPICAL | Status: DC | PRN
  Administered 2014-05-21: 6000 mL

## 2014-05-21 MED ORDER — MORPHINE SULFATE 2 MG/ML IJ SOLN
2.0000 mg | INTRAMUSCULAR | Status: DC | PRN
  Administered 2014-05-21 – 2014-05-22 (×3): 2 mg via INTRAVENOUS
  Filled 2014-05-21 (×3): qty 1

## 2014-05-21 MED ORDER — DOCUSATE SODIUM 100 MG PO CAPS
200.0000 mg | ORAL_CAPSULE | Freq: Every day | ORAL | Status: DC
  Administered 2014-05-22 – 2014-05-26 (×4): 200 mg via ORAL
  Filled 2014-05-21 (×5): qty 2

## 2014-05-21 MED ORDER — VANCOMYCIN HCL IN DEXTROSE 1-5 GM/200ML-% IV SOLN
1000.0000 mg | Freq: Once | INTRAVENOUS | Status: AC
  Administered 2014-05-21: 1000 mg via INTRAVENOUS
  Filled 2014-05-21: qty 200

## 2014-05-21 MED FILL — Lidocaine HCl IV Inj 20 MG/ML: INTRAVENOUS | Qty: 5 | Status: AC

## 2014-05-21 MED FILL — Mannitol IV Soln 20%: INTRAVENOUS | Qty: 500 | Status: AC

## 2014-05-21 MED FILL — Heparin Sodium (Porcine) Inj 1000 Unit/ML: INTRAMUSCULAR | Qty: 60 | Status: AC

## 2014-05-21 MED FILL — Electrolyte-R (PH 7.4) Solution: INTRAVENOUS | Qty: 3000 | Status: AC

## 2014-05-21 MED FILL — Sodium Bicarbonate IV Soln 8.4%: INTRAVENOUS | Qty: 50 | Status: AC

## 2014-05-21 MED FILL — Sodium Chloride IV Soln 0.9%: INTRAVENOUS | Qty: 3000 | Status: AC

## 2014-05-21 SURGICAL SUPPLY — 90 items
ATTRACTOMAT 16X20 MAGNETIC DRP (DRAPES) ×3 IMPLANT
BAG DECANTER FOR FLEXI CONT (MISCELLANEOUS) ×3 IMPLANT
BANDAGE ELASTIC 4 VELCRO ST LF (GAUZE/BANDAGES/DRESSINGS) ×3 IMPLANT
BANDAGE ELASTIC 6 VELCRO ST LF (GAUZE/BANDAGES/DRESSINGS) ×3 IMPLANT
BASKET HEART (ORDER IN 25'S) (MISCELLANEOUS) ×1
BASKET HEART (ORDER IN 25S) (MISCELLANEOUS) ×2 IMPLANT
BENZOIN TINCTURE PRP APPL 2/3 (GAUZE/BANDAGES/DRESSINGS) ×3 IMPLANT
BLADE STERNUM SYSTEM 6 (BLADE) ×3 IMPLANT
BLADE SURG 11 STRL SS (BLADE) ×3 IMPLANT
BNDG GAUZE ELAST 4 BULKY (GAUZE/BANDAGES/DRESSINGS) ×3 IMPLANT
CANISTER SUCTION 2500CC (MISCELLANEOUS) ×3 IMPLANT
CANNULA EZ GLIDE AORTIC 21FR (CANNULA) ×3 IMPLANT
CARDIAC SUCTION (MISCELLANEOUS) ×3 IMPLANT
CATH CPB KIT HENDRICKSON (MISCELLANEOUS) ×3 IMPLANT
CATH ROBINSON RED A/P 18FR (CATHETERS) ×3 IMPLANT
CATH THORACIC 36FR (CATHETERS) ×3 IMPLANT
CATH THORACIC 36FR RT ANG (CATHETERS) ×3 IMPLANT
CLIP FOGARTY SPRING 6M (CLIP) ×3 IMPLANT
CLIP TI MEDIUM 24 (CLIP) ×12 IMPLANT
CLIP TI WIDE RED SMALL 24 (CLIP) IMPLANT
COVER SURGICAL LIGHT HANDLE (MISCELLANEOUS) ×3 IMPLANT
CRADLE DONUT ADULT HEAD (MISCELLANEOUS) ×3 IMPLANT
DRAPE CARDIOVASCULAR INCISE (DRAPES) ×1
DRAPE SLUSH/WARMER DISC (DRAPES) ×3 IMPLANT
DRAPE SRG 135X102X78XABS (DRAPES) ×2 IMPLANT
DRSG COVADERM 4X14 (GAUZE/BANDAGES/DRESSINGS) ×3 IMPLANT
ELECT REM PT RETURN 9FT ADLT (ELECTROSURGICAL) ×6
ELECTRODE REM PT RTRN 9FT ADLT (ELECTROSURGICAL) ×4 IMPLANT
GAUZE SPONGE 4X4 12PLY STRL (GAUZE/BANDAGES/DRESSINGS) ×6 IMPLANT
GLOVE BIO SURGEON STRL SZ 6 (GLOVE) ×12 IMPLANT
GLOVE BIOGEL PI IND STRL 7.0 (GLOVE) ×6 IMPLANT
GLOVE BIOGEL PI INDICATOR 7.0 (GLOVE) ×3
GLOVE EUDERMIC 7 POWDERFREE (GLOVE) ×6 IMPLANT
GLOVE SURG SIGNA 7.5 PF LTX (GLOVE) ×9 IMPLANT
GOWN STRL REUS W/ TWL LRG LVL3 (GOWN DISPOSABLE) ×8 IMPLANT
GOWN STRL REUS W/ TWL XL LVL3 (GOWN DISPOSABLE) ×4 IMPLANT
GOWN STRL REUS W/TWL LRG LVL3 (GOWN DISPOSABLE) ×4
GOWN STRL REUS W/TWL XL LVL3 (GOWN DISPOSABLE) ×2
HEMOSTAT POWDER SURGIFOAM 1G (HEMOSTASIS) ×9 IMPLANT
HEMOSTAT SURGICEL 2X14 (HEMOSTASIS) ×3 IMPLANT
INSERT FOGARTY XLG (MISCELLANEOUS) IMPLANT
KIT BASIN OR (CUSTOM PROCEDURE TRAY) ×3 IMPLANT
KIT ROOM TURNOVER OR (KITS) ×3 IMPLANT
KIT SUCTION CATH 14FR (SUCTIONS) ×6 IMPLANT
KIT VASOVIEW W/TROCAR VH 2000 (KITS) ×3 IMPLANT
MARKER GRAFT CORONARY BYPASS (MISCELLANEOUS) ×9 IMPLANT
NS IRRIG 1000ML POUR BTL (IV SOLUTION) ×18 IMPLANT
PACK OPEN HEART (CUSTOM PROCEDURE TRAY) ×3 IMPLANT
PAD ARMBOARD 7.5X6 YLW CONV (MISCELLANEOUS) ×6 IMPLANT
PAD ELECT DEFIB RADIOL ZOLL (MISCELLANEOUS) ×3 IMPLANT
PENCIL BUTTON HOLSTER BLD 10FT (ELECTRODE) ×3 IMPLANT
PUNCH AORTIC ROTATE 4.0MM (MISCELLANEOUS) IMPLANT
PUNCH AORTIC ROTATE 4.5MM 8IN (MISCELLANEOUS) ×3 IMPLANT
PUNCH AORTIC ROTATE 5MM 8IN (MISCELLANEOUS) IMPLANT
SET CARDIOPLEGIA MPS 5001102 (MISCELLANEOUS) ×3 IMPLANT
SPONGE GAUZE 4X4 12PLY STER LF (GAUZE/BANDAGES/DRESSINGS) ×6 IMPLANT
STRIP CLOSURE SKIN 1/2X4 (GAUZE/BANDAGES/DRESSINGS) ×3 IMPLANT
SUT BONE WAX W31G (SUTURE) ×3 IMPLANT
SUT MNCRL AB 4-0 PS2 18 (SUTURE) ×3 IMPLANT
SUT PROLENE 3 0 SH DA (SUTURE) ×3 IMPLANT
SUT PROLENE 4 0 RB 1 (SUTURE)
SUT PROLENE 4 0 SH DA (SUTURE) IMPLANT
SUT PROLENE 4-0 RB1 .5 CRCL 36 (SUTURE) IMPLANT
SUT PROLENE 6 0 C 1 30 (SUTURE) ×12 IMPLANT
SUT PROLENE 7 0 BV 1 (SUTURE) ×3 IMPLANT
SUT PROLENE 7 0 BV1 MDA (SUTURE) ×6 IMPLANT
SUT PROLENE 8 0 BV175 6 (SUTURE) ×18 IMPLANT
SUT STEEL 6MS V (SUTURE) ×3 IMPLANT
SUT STEEL STERNAL CCS#1 18IN (SUTURE) IMPLANT
SUT STEEL SZ 6 DBL 3X14 BALL (SUTURE) ×3 IMPLANT
SUT VIC AB 1 CTX 36 (SUTURE) ×2
SUT VIC AB 1 CTX36XBRD ANBCTR (SUTURE) ×4 IMPLANT
SUT VIC AB 2-0 CT1 27 (SUTURE) ×1
SUT VIC AB 2-0 CT1 TAPERPNT 27 (SUTURE) ×2 IMPLANT
SUT VIC AB 2-0 CTX 27 (SUTURE) IMPLANT
SUT VIC AB 3-0 SH 27 (SUTURE)
SUT VIC AB 3-0 SH 27X BRD (SUTURE) IMPLANT
SUT VIC AB 3-0 X1 27 (SUTURE) IMPLANT
SUT VICRYL 4-0 PS2 18IN ABS (SUTURE) IMPLANT
SUTURE E-PAK OPEN HEART (SUTURE) ×3 IMPLANT
SYSTEM SAHARA CHEST DRAIN ATS (WOUND CARE) ×3 IMPLANT
TAPE CLOTH SURG 4X10 WHT LF (GAUZE/BANDAGES/DRESSINGS) ×3 IMPLANT
TAPE PAPER 2X10 WHT MICROPORE (GAUZE/BANDAGES/DRESSINGS) ×3 IMPLANT
TOWEL OR 17X24 6PK STRL BLUE (TOWEL DISPOSABLE) ×6 IMPLANT
TOWEL OR 17X26 10 PK STRL BLUE (TOWEL DISPOSABLE) ×6 IMPLANT
TRAY FOLEY IC TEMP SENS 16FR (CATHETERS) ×3 IMPLANT
TUBE FEEDING 8FR 16IN STR KANG (MISCELLANEOUS) ×3 IMPLANT
TUBING INSUFFLATION (TUBING) ×3 IMPLANT
UNDERPAD 30X30 INCONTINENT (UNDERPADS AND DIAPERS) ×3 IMPLANT
WATER STERILE IRR 1000ML POUR (IV SOLUTION) ×6 IMPLANT

## 2014-05-21 NOTE — Progress Notes (Signed)
  Echocardiogram Echocardiogram Transesophageal has been performed.  Justin Salinas 05/21/2014, 8:24 AM

## 2014-05-21 NOTE — Brief Op Note (Addendum)
05/19/2014 - 05/21/2014  11:13 AM  PATIENT:  Justin Salinas  73 y.o. male  PRE-OPERATIVE DIAGNOSIS:  CAD  POST-OPERATIVE DIAGNOSIS:  CAD  PROCEDURE:  CORONARY ARTERY BYPASS GRAFTING x 4  Free LIMA to LAD (proximal off hood of OM1 vein)  SVG to D1  SVG to OM1  SVG to Acute marginal ENDOSCOPIC VEIN HARVEST RIGHT LEG  SURGEON:  Melrose Nakayama, MD  ASSISTANT: Suzzanne Cloud, PA-C  ANESTHESIA:   general   Acute marginal and diagonal fair targets LAD and OM1 good targets Inferior scar  XC= 87 min CPB= 136 min  PATIENT CONDITION:  ICU - intubated and hemodynamically stable.  PRE-OPERATIVE WEIGHT: 75 kg

## 2014-05-21 NOTE — Transfer of Care (Signed)
Immediate Anesthesia Transfer of Care Note  Patient: Justin Salinas  Procedure(s) Performed: Procedure(s): CORONARY ARTERY BYPASS GRAFTING (CABG) x  four, using left internal mammary artery and right leg greater saphenous vein harvested endoscopically (N/A) INTRAOPERATIVE TRANSESOPHAGEAL ECHOCARDIOGRAM (N/A)  Patient Location: SICU  Anesthesia Type:General  Level of Consciousness: sedated and Patient remains intubated per anesthesia plan  Airway & Oxygen Therapy: Patient remains intubated per anesthesia plan and Patient placed on Ventilator (see vital sign flow sheet for setting)  Post-op Assessment: Report given to PACU RN and Post -op Vital signs reviewed and stable  Post vital signs: Reviewed and stable  Complications: No apparent anesthesia complications

## 2014-05-21 NOTE — Progress Notes (Signed)
Pt ready for CABG. Report called to Anesthesia   Vs stable 143/64  hr 52 cbg = 103.  Valium and lopressor given.  Pt voided. Friend at bedside. Check list completed.  Consents in chart.  Heparin d/c on call.  Glasses, razor and hearing aids with friend.  Will continue to monitor at this time.  Will transport to OR with monitor and take clothing to post surgery unit.  Saunders Revel T

## 2014-05-21 NOTE — H&P (View-Only) (Signed)
Reason for Consult: 3 vessel CAD with exertional angina Referring Physician: Dr. Arbutus Leas D Cadenas is an 73 y.o. male.  HPI: 73 yo man with known CAD presents with chest pain  Mr. Hannum is a 73 year old man with a history of CAD, MI, hypertension, chronic kidney disease, type II diabetes and tobacco abuse. He had an inferior MI in the past. He had PCI of the RCA in 1995. The RCA was occluded in 2000. He had moderate disease in the left system that was treated medically.  Kraig Genis Wojdyla is a 73 y.o. male with a past medical history of CAD s/p RCA PCI ('95), HTN, CKD, DM, and HLD who was added on to the [facility] clinic schedule for evaluation of exertional CP and SOB.  He is very active and walks about an hour 3-4 times a week at his church. Over the past 2 months he has noted that it is getting harder to walk due to chest pressure. He says he has to make himself do it. The chest pain is a deep pressure sensation that sometimes radiates to his left shoulder. It is associated with shortness of breath and is relieved with 5- 10 minutes of rest. He has not had any rest or nocturnal pain. It has gotten progressively worse over the past 2 months. He saw Dr. Maceo Pro and was referred to Cardiology.   He underwent catheterization today which showed a chronically occluded RCA, a tight ostial LAD stenosis, and moderate disease in the short left main, left circumflex and diagonal 1.  He has bilat 60-99% renal artery stenosis by doppler but reportedly normal renal arteries at cath in 2000. He also has a 50- 69% left subclavian stenosis.  He is currently pain free. He is a retired Administrator. He quit smoking 30 years ago, but still uses [employer] occasionally.   Studies: - Echo (10/2013): EF 55-60%, no RWMA. Mod concentric hypertrophy. Mod increased ventricular septum thickness. Calcified MV with valve area 2.02 cm^2.  - Carotid US (10/2012): Left subclavian 50-69% diameter reduction. Right bulb  0-49% diameter reduction. RICA normal. Left mid to distal CCA 0-49% diameter reduction. Left bulb/prox ICA: 50-69% diameter reduction. L vertebral with antgrade flow. Needs follow up in 1 year.   Past Medical History  Diagnosis Date  . Allergy   . Diabetes     AODM  . Asthma   . Hyperlipidemia   . Hypertension   . MI (myocardial infarction) 09/22/1993  . CAD (coronary artery disease)   . BPH (benign prostatic hyperplasia)   . GERD (gastroesophageal reflux disease)   . Ischemic chest pain 05/19/2014    Past Surgical History  Procedure Laterality Date  . Back surgery  2000    back fusion - Dr. Earnie Larsson  . Vasectomy  1963  . Angioplasty  09/22/1993    POBA of RCA (Dr. Marella Chimes)  . Transthoracic echocardiogram  10/20/2012    EF 55-60%, moderate concentric hypertrophy, ventricular septum thickness increased, calcified MV annulus  . Nm myocar perf wall motion  09/19/2009    bruce myoview - mild perfusion defect in basal inferior region (infarct/scar), EF 60%, low risk scan  . Cardiac catheterization  11/28/1998    "silent occlusion" of RCA w/collaterals from left coronary system, prox LAD w/40-50% eccentric narrowing, 1st diagonal with 70-80% eccentric narrowing, 85% narrowing of prox small OM1 (Dr. Marella Chimes)  . Renal doppler  10/2011    SMA w/ 70-99% diameter reduction & high grade  stenosis; R&L renals w/narrowing and increased velocities (60-99%), R kidney smaller than L  . Carotid doppler  07/2011    left subclavian (50-69%); right bulb (0-49%); RICA (normal); left mid-distal CCA (0-49%); left bulb/prox ICA (50-69%); left vertebral with abnormal antegrade flow  . Nasal sinus surgery  2010    Family History  Problem Relation Age of Onset  . Colon cancer Neg Hx   . Uterine cancer Mother   . Diabetes Sister   . Colon polyps Brother   . Heart disease Brother     Social History:  reports that he quit smoking about 41 years ago. His smoking use included Cigarettes. He smoked  0.00 packs per day. His smokeless tobacco use includes Snuff. He reports that he drinks about 1.8 oz of alcohol per week. He reports that he does not use illicit drugs.  Allergies: No Known Allergies  Medications:  Prior to Admission:  Prescriptions prior to admission  Medication Sig Dispense Refill Last Dose  . ACCU-CHEK AVIVA PLUS test strip    05/19/2014 at Unknown time  . aspirin 81 MG tablet Take 81 mg by mouth daily.   05/18/2014 at 0900  . glimepiride (AMARYL) 4 MG tablet Take 4 mg by mouth 2 (two) times daily.   05/17/2014 at 0900  . JANUVIA 100 MG tablet Take 100 mg by mouth daily.    05/18/2014 at 0900  . Lancets Misc. (ACCU-CHEK SOFTCLIX LANCET DEV) KIT    05/18/2014 at Unknown time  . lisinopril (PRINIVIL) 20 MG tablet Take 1 tablet (20 mg total) by mouth daily. (Patient taking differently: Take 20 mg by mouth daily. TAKING 1 IN THE AM)   05/19/2014 at 0930  . metFORMIN (GLUCOPHAGE) 1000 MG tablet Take 1,000 mg by mouth 2 (two) times daily with a meal.   05/18/2014 at 0900  . Multiple Vitamins-Minerals (CENTRUM SILVER ADULT 50+ PO) Take by mouth.   05/18/2014 at Unknown time  . simvastatin (ZOCOR) 40 MG tablet Take 1 tablet (40 mg total) by mouth every evening. 30 tablet 6 05/17/2014 at 1800  . triamterene-hydrochlorothiazide (DYAZIDE) 37.5-25 MG per capsule Take by mouth every morning. Takes 1/2 tablet on mon, wed, fri   Taking  . nitroGLYCERIN (NITROSTAT) 0.4 MG SL tablet Place 1 tablet (0.4 mg total) under the tongue every 5 (five) minutes as needed for chest pain. (Patient not taking: Reported on 05/18/2014) 25 tablet 2 Unknown at Unknown time  . triamterene-hydrochlorothiazide (MAXZIDE-25) 37.5-25 MG per tablet Take 0.5 tablets by mouth once a week. Takes on Mondays   Unknown at Unknown time  . vitamin B-12 (CYANOCOBALAMIN) 100 MCG tablet Take 50 mcg by mouth daily.   Unknown at Unknown time    Results for orders placed or performed during the hospital encounter of 05/19/14  (from the past 48 hour(s))  Glucose, capillary     Status: Abnormal   Collection Time: 05/19/14 11:37 AM  Result Value Ref Range   Glucose-Capillary 118 (H) 70 - 99 mg/dL  CBC     Status: Abnormal   Collection Time: 05/19/14 12:00 PM  Result Value Ref Range   WBC 4.6 4.0 - 10.5 K/uL   RBC 3.40 (L) 4.22 - 5.81 MIL/uL   Hemoglobin 9.1 (L) 13.0 - 17.0 g/dL   HCT 29.6 (L) 39.0 - 52.0 %   MCV 87.1 78.0 - 100.0 fL   MCH 26.8 26.0 - 34.0 pg   MCHC 30.7 30.0 - 36.0 g/dL   RDW 13.9 11.5 - 15.5 %  Platelets 227 150 - 400 K/uL  Glucose, capillary     Status: Abnormal   Collection Time: 05/19/14  5:17 PM  Result Value Ref Range   Glucose-Capillary 59 (L) 70 - 99 mg/dL    No results found.  Review of Systems  Constitutional: Positive for malaise/fatigue. Negative for fever and chills.  Respiratory: Positive for shortness of breath.   Cardiovascular: Positive for chest pain.  Gastrointestinal:       Reflux  Neurological: Negative for speech change and focal weakness.  Endo/Heme/Allergies: Does not bruise/bleed easily.  All other systems reviewed and are negative.  Blood pressure 128/57, pulse 58, temperature 98 F (36.7 C), temperature source Oral, resp. rate 18, height _0  (1.803 m), weight 170 lb (77.111 kg), SpO2 100 %. Physical Exam  Vitals reviewed. Constitutional: He is oriented to person, place, and time. He appears well-developed and well-nourished. No distress.  HENT:  Head: Normocephalic and atraumatic.  Eyes: EOM are normal. Pupils are equal, round, and reactive to light.  Neck: Neck supple. No thyromegaly present.  No carotid bruits  Cardiovascular: Normal rate, regular rhythm, normal heart sounds and intact distal pulses.   No murmur heard. + left subclavian bruit  Respiratory: Effort normal and breath sounds normal. He has no wheezes. He has no rales.  GI: Soft. There is no tenderness.  Musculoskeletal: He exhibits no edema.  Lymphadenopathy:    He has no  cervical adenopathy.  Neurological: He is alert and oriented to person, place, and time. No cranial nerve deficit.  No focal motor deficit  Skin: Skin is warm and dry.   CARDIAC CATHETERIZATION Procedural Findings: Hemodynamics: AO 139/45 LV 151/21  Coronary angiography: Coronary dominance: right  Left mainstem: There is 50% proximal stenosis. The left main divides into the LAD and LCx.   Left anterior descending (LAD): There is critical ostial/proximal LAD stenosis, estimated at 95%, with heavy calcification. The remainder of the LAD is patent with mild irregularity. The first diagonal is moderate in caliber with 75% proximal stenosis.   Left circumflex (LCx): There is a large intermediate branch without significant stenosis. The AV circumflex has mild irregularity and supplies 2 OM branches.   Right coronary artery (RCA): Severe calcification. Total occlusion at the ostium. The distal branch vessels of the RCA fill from left-to-right collaterals.   Left ventriculography: the inferior wall is mildly hypokinetic. The remaining LV wall segments contract normally with an LVEF of 55%  Aortic arch angiogram: heavy arch calcification. Heavy calcification at the origin of the left carotid and left subclavian.   Estimated Blood Loss: minimal  Final Conclusions:  1. Severe ostial LAD stenosis 2. Total occlusion of the RCA 3. Moderate left main stenosis 4. Patent left circumflex 5. Preserved LV systolic function  Recommendations: Hospital admission, TCTS consult for CABG, start IV heparin, high-risk coronary anatomy.   Assessment/Plan: 73 yo man with severe 2 vessel CAD and moderate left main disease. He needs CABG for survival benefit as well as relief of symptoms.  I discussed the general nature of the procedure, the need for general anesthesia, and the incisions to be used with Mr. Insurance underwriter. We discussed the expected hospital stay, overall recovery and short and long term  outcomes. He understands the risks include, but are not limited to death, stroke, MI, DVT/PE, bleeding, possible need for transfusion, infections, cardiac arrhythmias, and other organ system dysfunction including respiratory, renal, or GI complications. He understands the risks of early and/ or late graft occlusion. He accepts these  risks and agrees to proceed.  He has a left subclavian bruit and documented left subclavian stenosis. Will plan to use LIMA as a free graft or RIMA depending on anatomy.  For OR Friday AM  Cason Dabney C 05/19/2014, 5:27 PM

## 2014-05-21 NOTE — Anesthesia Preprocedure Evaluation (Signed)
Anesthesia Evaluation  Patient identified by MRN, date of birth, ID band Patient awake    Reviewed: Allergy & Precautions, H&P , NPO status , Patient's Chart, lab work & pertinent test results  Airway Mallampati: II  TM Distance: >3 FB Neck ROM: Full    Dental   Pulmonary asthma , former smoker,          Cardiovascular hypertension, + CAD and + Past MI Rhythm:Regular Rate:Normal     Neuro/Psych    GI/Hepatic Neg liver ROS, GERD-  ,  Endo/Other  diabetes  Renal/GU Renal disease     Musculoskeletal   Abdominal   Peds  Hematology   Anesthesia Other Findings   Reproductive/Obstetrics                             Anesthesia Physical Anesthesia Plan  ASA: III  Anesthesia Plan: General   Post-op Pain Management:    Induction: Intravenous  Airway Management Planned: Oral ETT  Additional Equipment:   Intra-op Plan:   Post-operative Plan: Post-operative intubation/ventilation  Informed Consent: I have reviewed the patients History and Physical, chart, labs and discussed the procedure including the risks, benefits and alternatives for the proposed anesthesia with the patient or authorized representative who has indicated his/her understanding and acceptance.     Plan Discussed with: CRNA and Anesthesiologist  Anesthesia Plan Comments:         Anesthesia Quick Evaluation

## 2014-05-21 NOTE — OR Nursing (Signed)
12:10 - 1st call to SICU charge nurse

## 2014-05-21 NOTE — OR Nursing (Signed)
12:35 - 2nd call to SICU charge nurse

## 2014-05-21 NOTE — Progress Notes (Signed)
POD # 1 CABG  Extubated  BP 113/63 mmHg  Pulse 79  Temp(Src) 98.2 F (36.8 C) (Oral)  Resp 10  Ht 5\' 11"  (1.803 m)  Wt 165 lb 12.6 oz (75.2 kg)  BMI 23.13 kg/m2  SpO2 98%   Intake/Output Summary (Last 24 hours) at 05/21/14 1928 Last data filed at 05/21/14 1900  Gross per 24 hour  Intake 5286.23 ml  Output   6306 ml  Net -1019.77 ml    Co= 4.1  Doing well early postop

## 2014-05-21 NOTE — Procedures (Signed)
Extubation Procedure Note  Patient Details:   Name: Justin Salinas DOB: Jul 16, 1940 MRN: [id number]   Airway Documentation:     Evaluation  O2 sats: stable throughout Complications: No apparent complications Patient did tolerate procedure well. Bilateral Breath Sounds: Clear, Diminished Suctioning: Oral, Airway Yes   Pt tolerated wean, positive for cuff leak, extubated to 4L [place]. No dyspnea or stridor noted after extubation. RT will continue to monitor.   Mariam Dollar 05/21/2014, 6:53 PM

## 2014-05-21 NOTE — Interval H&P Note (Signed)
History and Physical Interval Note:  05/21/2014 7:26 AM  Justin Salinas  has presented today for surgery, with the diagnosis of CAD  The various methods of treatment have been discussed with the patient and family. After consideration of risks, benefits and other options for treatment, the patient has consented to  Procedure(s): CORONARY ARTERY BYPASS GRAFTING (CABG) (N/A) INTRAOPERATIVE TRANSESOPHAGEAL ECHOCARDIOGRAM (N/A) as a surgical intervention .  The patient's history has been reviewed, patient examined, no change in status, stable for surgery.  I have reviewed the patient's chart and labs.  Questions were answered to the patient's satisfaction.     HENDRICKSON,STEVEN C

## 2014-05-22 ENCOUNTER — Inpatient Hospital Stay (HOSPITAL_COMMUNITY): Payer: Commercial Managed Care - HMO

## 2014-05-22 LAB — GLUCOSE, CAPILLARY
Glucose-Capillary: 143 mg/dL — ABNORMAL HIGH (ref 70–99)
Glucose-Capillary: 149 mg/dL — ABNORMAL HIGH (ref 70–99)
Glucose-Capillary: 126 mg/dL — ABNORMAL HIGH (ref 70–99)
Glucose-Capillary: 102 mg/dL — ABNORMAL HIGH (ref 70–99)
Glucose-Capillary: 75 mg/dL (ref 70–99)
Glucose-Capillary: 89 mg/dL (ref 70–99)
Glucose-Capillary: 101 mg/dL — ABNORMAL HIGH (ref 70–99)
Glucose-Capillary: 111 mg/dL — ABNORMAL HIGH (ref 70–99)
Glucose-Capillary: 108 mg/dL — ABNORMAL HIGH (ref 70–99)
Glucose-Capillary: 100 mg/dL — ABNORMAL HIGH (ref 70–99)
Glucose-Capillary: 98 mg/dL (ref 70–99)
Glucose-Capillary: 101 mg/dL — ABNORMAL HIGH (ref 70–99)
Glucose-Capillary: 109 mg/dL — ABNORMAL HIGH (ref 70–99)
Glucose-Capillary: 129 mg/dL — ABNORMAL HIGH (ref 70–99)
Glucose-Capillary: 134 mg/dL — ABNORMAL HIGH (ref 70–99)
Glucose-Capillary: 138 mg/dL — ABNORMAL HIGH (ref 70–99)
Glucose-Capillary: 142 mg/dL — ABNORMAL HIGH (ref 70–99)
Glucose-Capillary: 155 mg/dL — ABNORMAL HIGH (ref 70–99)
Glucose-Capillary: 162 mg/dL — ABNORMAL HIGH (ref 70–99)
Glucose-Capillary: 116 mg/dL — ABNORMAL HIGH (ref 70–99)

## 2014-05-22 LAB — BASIC METABOLIC PANEL
Anion gap: 9 (ref 5–15)
BUN: 14 mg/dL (ref 6–23)
CO2: 23 mEq/L (ref 19–32)
Calcium: 8.1 mg/dL — ABNORMAL LOW (ref 8.4–10.5)
Chloride: 101 mEq/L (ref 96–112)
Creatinine, Ser: 1.26 mg/dL (ref 0.50–1.35)
GFR calc Af Amer: 63 mL/min — ABNORMAL LOW (ref >90)
GFR calc non Af Amer: 54 mL/min — ABNORMAL LOW (ref >90)
Glucose, Bld: 115 mg/dL — ABNORMAL HIGH (ref 70–99)
Potassium: 4.7 mEq/L (ref 3.7–5.3)
Sodium: 133 mEq/L — ABNORMAL LOW (ref 137–147)

## 2014-05-22 LAB — CREATININE, SERUM
Creatinine, Ser: 1.55 mg/dL — ABNORMAL HIGH (ref 0.50–1.35)
GFR calc Af Amer: 49 mL/min — ABNORMAL LOW (ref >90)
GFR calc non Af Amer: 42 mL/min — ABNORMAL LOW (ref >90)

## 2014-05-22 LAB — CBC
HCT: 28 % — ABNORMAL LOW (ref 39.0–52.0)
HCT: 30 % — ABNORMAL LOW (ref 39.0–52.0)
Hemoglobin: 9.2 g/dL — ABNORMAL LOW (ref 13.0–17.0)
Hemoglobin: 9.4 g/dL — ABNORMAL LOW (ref 13.0–17.0)
MCH: 28.8 pg (ref 26.0–34.0)
MCH: 27.2 pg (ref 26.0–34.0)
MCHC: 32.9 g/dL (ref 30.0–36.0)
MCHC: 31.3 g/dL (ref 30.0–36.0)
MCV: 87.8 fL (ref 78.0–100.0)
MCV: 87 fL (ref 78.0–100.0)
Platelets: 112 10*3/uL — ABNORMAL LOW (ref 150–400)
Platelets: 124 10*3/uL — ABNORMAL LOW (ref 150–400)
RBC: 3.19 MIL/uL — ABNORMAL LOW (ref 4.22–5.81)
RBC: 3.45 MIL/uL — ABNORMAL LOW (ref 4.22–5.81)
RDW: 14.5 % (ref 11.5–15.5)
RDW: 14.7 % (ref 11.5–15.5)
WBC: 9.2 10*3/uL (ref 4.0–10.5)
WBC: 10.1 10*3/uL (ref 4.0–10.5)

## 2014-05-22 LAB — POCT I-STAT, CHEM 8
BUN: 18 mg/dL (ref 6–23)
Calcium, Ion: 1.13 mmol/L (ref 1.13–1.30)
Chloride: 96 mEq/L (ref 96–112)
Creatinine, Ser: 1.7 mg/dL — ABNORMAL HIGH (ref 0.50–1.35)
Glucose, Bld: 149 mg/dL — ABNORMAL HIGH (ref 70–99)
HCT: 30 % — ABNORMAL LOW (ref 39.0–52.0)
Hemoglobin: 10.2 g/dL — ABNORMAL LOW (ref 13.0–17.0)
Potassium: 4.8 mEq/L (ref 3.7–5.3)
Sodium: 133 mEq/L — ABNORMAL LOW (ref 137–147)
TCO2: 21 mmol/L (ref 0–100)

## 2014-05-22 LAB — MAGNESIUM
Magnesium: 2.5 mg/dL (ref 1.5–2.5)
Magnesium: 2.4 mg/dL (ref 1.5–2.5)

## 2014-05-22 MED ORDER — ATORVASTATIN CALCIUM 20 MG PO TABS
20.0000 mg | ORAL_TABLET | Freq: Every day | ORAL | Status: DC
  Administered 2014-05-22 – 2014-05-25 (×4): 20 mg via ORAL
  Filled 2014-05-22 (×5): qty 1

## 2014-05-22 MED ORDER — AMLODIPINE BESYLATE 10 MG PO TABS
10.0000 mg | ORAL_TABLET | Freq: Every day | ORAL | Status: DC
  Administered 2014-05-22 – 2014-05-26 (×5): 10 mg via ORAL
  Filled 2014-05-22 (×5): qty 1

## 2014-05-22 MED ORDER — METOPROLOL TARTRATE 25 MG/10 ML ORAL SUSPENSION
25.0000 mg | Freq: Two times a day (BID) | ORAL | Status: DC
  Filled 2014-05-22 (×2): qty 10

## 2014-05-22 MED ORDER — FUROSEMIDE 10 MG/ML IJ SOLN
40.0000 mg | Freq: Once | INTRAMUSCULAR | Status: AC
  Administered 2014-05-22: 40 mg via INTRAVENOUS
  Filled 2014-05-22: qty 4

## 2014-05-22 MED ORDER — INSULIN ASPART 100 UNIT/ML ~~LOC~~ SOLN
0.0000 [IU] | SUBCUTANEOUS | Status: DC
  Administered 2014-05-22 (×2): 4 [IU] via SUBCUTANEOUS
  Administered 2014-05-23 (×2): 2 [IU] via SUBCUTANEOUS

## 2014-05-22 MED ORDER — METOPROLOL TARTRATE 25 MG/10 ML ORAL SUSPENSION
12.5000 mg | Freq: Two times a day (BID) | ORAL | Status: DC
  Filled 2014-05-22 (×7): qty 5

## 2014-05-22 MED ORDER — ENOXAPARIN SODIUM 40 MG/0.4ML ~~LOC~~ SOLN: 40.0000 mg | SUBCUTANEOUS | Status: DC

## 2014-05-22 MED ORDER — GLIMEPIRIDE 4 MG PO TABS
4.0000 mg | ORAL_TABLET | Freq: Two times a day (BID) | ORAL | Status: DC
  Administered 2014-05-22 – 2014-05-26 (×8): 4 mg via ORAL
  Filled 2014-05-22 (×10): qty 1

## 2014-05-22 MED ORDER — INSULIN DETEMIR 100 UNIT/ML ~~LOC~~ SOLN
20.0000 [IU] | Freq: Every day | SUBCUTANEOUS | Status: DC
  Administered 2014-05-23: 20 [IU] via SUBCUTANEOUS
  Filled 2014-05-22 (×2): qty 0.2

## 2014-05-22 MED ORDER — LINAGLIPTIN 5 MG PO TABS
5.0000 mg | ORAL_TABLET | Freq: Every day | ORAL | Status: DC
  Administered 2014-05-22 – 2014-05-26 (×5): 5 mg via ORAL
  Filled 2014-05-22 (×5): qty 1

## 2014-05-22 MED ORDER — ENOXAPARIN SODIUM 40 MG/0.4ML ~~LOC~~ SOLN
40.0000 mg | Freq: Every day | SUBCUTANEOUS | Status: DC
  Administered 2014-05-22 – 2014-05-25 (×4): 40 mg via SUBCUTANEOUS
  Filled 2014-05-22 (×5): qty 0.4

## 2014-05-22 MED ORDER — METOPROLOL TARTRATE 25 MG PO TABS
25.0000 mg | ORAL_TABLET | Freq: Two times a day (BID) | ORAL | Status: DC
  Administered 2014-05-22: 25 mg via ORAL
  Filled 2014-05-22 (×2): qty 1

## 2014-05-22 MED ORDER — METOPROLOL TARTRATE 12.5 MG HALF TABLET
12.5000 mg | ORAL_TABLET | Freq: Two times a day (BID) | ORAL | Status: DC
  Administered 2014-05-23 – 2014-05-24 (×4): 12.5 mg via ORAL
  Filled 2014-05-22 (×7): qty 1

## 2014-05-22 MED ORDER — INSULIN DETEMIR 100 UNIT/ML ~~LOC~~ SOLN
20.0000 [IU] | Freq: Once | SUBCUTANEOUS | Status: AC
  Administered 2014-05-22: 20 [IU] via SUBCUTANEOUS
  Filled 2014-05-22: qty 0.2

## 2014-05-22 NOTE — Op Note (Signed)
Justin Salinas, Justin Salinas NO.:  [id number]  MEDICAL RECORD NO.:  [id number]  LOCATION:  2S10C                        FACILITY:  [facility]  PHYSICIAN:  Revonda Standard. Roxan Hockey, M.D.DATE OF BIRTH:  [birth year]  DATE OF PROCEDURE:  05/21/2014 DATE OF DISCHARGE:                              OPERATIVE REPORT   PREOPERATIVE DIAGNOSIS:  Left main and severe 2-vessel coronary artery disease with exertional angina.  POSTOPERATIVE DIAGNOSIS:  Left main and severe 2-vessel coronary artery disease with exertional angina.  PROCEDURE:   Median sternotomy, extracorporeal circulation, coronary artery bypass grafting x4   Free left internal mammary artery to LAD (Y-graft off OM1 vein)  Saphenous vein graft to obtuse marginal 1  Saphenous vein graft to first diagonal  Saphenous vein graft to acute marginal Endoscopic vein harvest right leg.  SURGEON:  Revonda Standard. Roxan Hockey, M.D.  ASSISTANT:  Suzzanne Cloud, P.A.  ANESTHESIA:  General.  FINDINGS:  Transesophageal echocardiography showed some inferior hypokinesis, but overall preserved left ventricular function.  There was mild mitral regurgitation.  The LAD and OM 1 were good quality targets. The first diagonal and acute marginal fair quality targets.  The left mammary was a good quality vessel, but would not reach as a pedicle graft, therefore used as a free graft.  The saphenous vein to the OM was good quality. Saphenous veins to the diagonal and acute marginal were fair quality.  CLINICAL NOTE:  Justin Salinas is a 73 year old gentleman with a longstanding history of coronary disease.  He presented with progressive exertional angina.  At catheterization, he was noted to have a chronically occluded right coronary artery.  There was a tight ostial LAD stenosis, ~ 95%.  There was a 50% left main stenosis and moderate disease in the first diagonal branch of the LAD and in the circumflex distribution.  The patient was advised to undergo  coronary artery bypass grafting.  The indications, risks, benefits, and alternatives were discussed in detail with the patient.  He understood and accepted the risks and agreed to proceed.  OPERATIVE NOTE:  Justin Salinas was brought to the preoperative holding area on May 21, 2014.  There, Anesthesia placed a Swan-Ganz catheter and an arterial blood pressure monitoring line.  He was taken to the operating room, anesthetized, and intubated.  A Foley catheter was placed.  Intravenous antibiotics were administered.  The chest, abdomen, and legs were prepped and draped in usual sterile fashion.  A median sternotomy was performed and left internal mammary artery was harvested using standard technique.  Simultaneously, an incision was made in the medial aspect of the right leg at the level of the knee.  The greater saphenous vein was identified.  It was harvested endoscopically from groin to mid calf.  2000 units of heparin was administered during the vessel harvest.  The remainder of the full heparin dose was given prior to opening the pericardium.  The left internal mammary was assessed, it had an excellent pulse and excellent flow despite the subclavian bruit and known moderate subclavian stenosis. It appeared adequate to use as a pedicle graft, but it subsequently turned out that the mammary would not reach the distal LAD as a pedicle graft.  Therefore, it  was used as a free graft.  It was left attached at this time.  After harvesting the conduits, the remainder of full heparin dose was given.  The pericardium was opened. After confirming adequate anticoagulation with ACT measurement, the aorta was cannulated via concentric 2-0 Ethibond pledgeted pursestring sutures.  A dual-stage venous cannula placed via a pursestring suture in the right atrial appendage.  Cardiopulmonary bypass was instituted.  Flows were maintained per protocol.  The patient was cooled to 32 degrees Celsius.  The coronary  arteries were inspected and anastomotic sites were chosen.  The conduits were inspected and cut to length.  A foam pad was placed in the pericardium to insulate the heart and protect the left phrenic nerve.  A temperature probe was placed in the myocardial septum.  A cardioplegia cannula was placed in the ascending aorta.  The left mammary was transected proximally and the proximal stump was suture ligated.  The aorta was cross-clamped.  The left ventricle was emptied via the aortic root vent.  Cardiac arrest then was achieved with a combination of cold antegrade blood cardioplegia and topical iced saline.  After achieving a complete diastolic arrest and adequate myocardial septal cooling with 1 L of cardioplegia, the following distal anastomoses were performed.  First, a reversed saphenous vein graft was placed end-to-side to the acute marginal branch to the right coronary.  This was the only graftable target in the right coronary distribution.  It did accept 1.5 mm probe. It was a fair quality target.  The vein was a fair quality, it was anastomosed end-to-side with a running 7-0 Prolene suture.  At the completion of each anastomosis, a probe was passed proximally and distally to ensure patency.  Cardioplegia then was administered down each vein graft to assess flow and hemostasis.  Both were good for this graft.  Next, a reversed saphenous vein graft was placed end-to-side to the first diagonal branch to the LAD.  This was a high anterolateral branch.  It only accepted a 1 mm probe distally. It had a plaque proximally.  The vein was of fair quality as was the artery.  Anastomosis was performed with running 7-0 Prolene suture.  Again, there was good flow through the graft and good hemostasis with cardioplegia administration.  Next, a reversed saphenous vein graft was placed end-to-side to the obtuse marginal 1.  This was the dominant lateral branch.  It was a 2 mm good quality target.   It was intramyocardial.  The vein graft to this vessel was of better quality, it was larger size.  The vein was anastomosed end-to-side with a running 7-0 Prolene suture.  There was excellent flow through the graft and good hemostasis.  Next, the distal end of the free left mammary graft was beveled.  It was then anastomosed end-to-side to the distal LAD.  The LAD was a 2 mm good quality target.  The mammary was a good quality conduit.  The end-to- side anastomosis was performed with a running 8-0 Prolene suture.  After completion of the anastomosis, a probe passed easily in both directions. Cardioplegia was administered down the vein grafts and the aortic root. There was good backbleeding from the mammary artery.  The vein grafts were cut to length.  The cardioplegia cannula was removed from the ascending aorta.  The proximal vein graft anastomoses were performed to 4.0 mm punch aortotomies with running 6-0 Prolene sutures.  The free left mammary proximal anastomosis was made to the hood of the OM1  vein graft.  This was done with a running 7-0 Prolene suture.  At the completion of final proximal anastomosis, the patient was placed in Trendelenburg position.  Lidocaine was administered.  The aortic root was de-aired and the aortic cross-clamp was removed.  Total cross-clamp time was 87 minutes.  The patient spontaneously resumed sinus rhythm.  He did not require defibrillation.  While rewarming was completed, all proximal and distal anastomoses were inspected for hemostasis.  Epicardial pacing wires were placed on the right ventricle and right atrium.  When the patient had rewarmed to a core temperature of 37 degrees Celsius, he was weaned from cardiopulmonary bypass on the first attempt without difficulty.  The total bypass time was 136 minutes.  The initial cardiac index greater than 2 liters/minute/meter squared.  The patient remained hemodynamically stable throughout the post bypass  period.  A test dose protamine was administered and was well tolerated.  The atrial and aortic cannulae were removed.  The remainder of the protamine was administered without incident.  Chest was irrigated with warm saline. Hemostasis was achieved.  The left pleural and mediastinal chest tubes were placed through separate subcostal incisions.  The pericardium was reapproximated with interrupted 3-0 silk sutures.  It came together easily without tension or kinking of the underlying grafts.  The sternum was closed with a combination of single and double heavy gauge stainless steel wires.  The pectoralis fascia, subcutaneous tissue, and skin were closed in standard fashion.  All sponge, needle, and instrument counts were correct at the end of procedure.  The patient was taken from the operating room to the surgical intensive care unit in good condition.     Revonda Standard Roxan Hockey, M.D.     SCH/MEDQ  D:  05/21/2014  T:  05/22/2014  Job:  [id number]

## 2014-05-22 NOTE — Progress Notes (Signed)
1 Day Post-Op Procedure(s) (LRB): CORONARY ARTERY BYPASS GRAFTING (CABG) x  four, using left internal mammary artery and right leg greater saphenous vein harvested endoscopically (N/A) INTRAOPERATIVE TRANSESOPHAGEAL ECHOCARDIOGRAM (N/A) Subjective: Feels well Some incisional discomfort   Objective: Vital signs in last 24 hours: Temp:  [95.7 F (35.4 C)-98.6 F (37 C)] 98.2 F (36.8 C) (12/19 0900) Pulse Rate:  [70-80] 71 (12/19 0900) Cardiac Rhythm:  [-] Atrial paced (12/19 0800) Resp:  [5-16] 11 (12/19 0900) BP: (98-136)/(53-75) 132/66 mmHg (12/19 0900) SpO2:  [96 %-100 %] 99 % (12/19 0900) Arterial Line BP: (106-167)/(46-100) 152/58 mmHg (12/19 0900) FiO2 (%):  [40 %-50 %] 40 % (12/18 1805) Weight:  [177 lb 1.6 oz (80.332 kg)] 177 lb 1.6 oz (80.332 kg) (12/19 0500)  Hemodynamic parameters for last 24 hours: PAP: (14-30)/(3-14) 25/9 mmHg CO:  [3.1 L/min-4.5 L/min] 4.5 L/min CI:  [1.6 L/min/m2-2.3 L/min/m2] 2.3 L/min/m2  Intake/Output from previous day: 12/18 0701 - 12/19 0700 In: 5923.4 [I.V.:3638.4; Blood:835; IV [id number] Out: [id number] [Urine:4130; Blood:1570; Chest Tube:366] Intake/Output this shift: Total I/O In: 290.3 [I.V.:290.3] Out: 150 [Urine:150]  General appearance: alert and no distress Neurologic: intact Heart: regular rate and rhythm Lungs: diminished breath sounds bibasilar Abdomen: normal findings: soft, non-tender  Lab Results:  Recent Labs  05/21/14 1950 05/21/14 1953 05/22/14 0430  WBC 6.5  --  9.2  HGB 9.3* 9.9* 9.2*  HCT 29.1* 29.0* 28.0*  PLT 110*  --  112*   BMET:  Recent Labs  05/21/14 0231  05/21/14 1953 05/22/14 0430  NA 142  < > 141 133*  K 4.3  < > 4.9 4.7  CL 104  < > 107 101  CO2 27  --   --  23  GLUCOSE 90  < > 162* 115*  BUN 17  < > 14 14  CREATININE 1.24  < > 1.30 1.26  CALCIUM 8.8  --   --  8.1*  < > = values in this interval not displayed.  PT/INR:  Recent Labs  05/21/14 1300  LABPROT 17.6*  INR 1.43    ABG    Component Value Date/Time   PHART 7.325* 05/21/2014 1948   HCO3 23.1 05/21/2014 1948   TCO2 22 05/21/2014 1953   ACIDBASEDEF 3.0* 05/21/2014 1948   O2SAT 95.0 05/21/2014 1948   CBG (last 3)   Recent Labs  05/21/14 1706 05/21/14 1810 05/21/14 1907  GLUCAP 152* 153* 149*    Assessment/Plan: S/P Procedure(s) (LRB): CORONARY ARTERY BYPASS GRAFTING (CABG) x  four, using left internal mammary artery and right leg greater saphenous vein harvested endoscopically (N/A) INTRAOPERATIVE TRANSESOPHAGEAL ECHOCARDIOGRAM (N/A) -  CV- good hemodynamics- dc swan  Hypertension- increase metoprolol, start norvasc  ASA, statin  RESP- IS  RENAL- diurese, lytes OK  ENDO- DM- change to levemir + SSI, restart tradjenta and amaryl  DC CT  SCD + enoxaparin for DVT prophylaxis  Mobilize   LOS: 3 days    Kelly Eisler C 05/22/2014

## 2014-05-22 NOTE — Progress Notes (Signed)
He is just back from a walk.  He got dizzy and BP is low  Turned on pacer atrial at 90  Creatinine up a little  Will decrease lopressor back to 12.5 BID

## 2014-05-23 ENCOUNTER — Inpatient Hospital Stay (HOSPITAL_COMMUNITY): Payer: Commercial Managed Care - HMO

## 2014-05-23 LAB — GLUCOSE, CAPILLARY
Glucose-Capillary: 135 mg/dL — ABNORMAL HIGH (ref 70–99)
Glucose-Capillary: 136 mg/dL — ABNORMAL HIGH (ref 70–99)
Glucose-Capillary: 138 mg/dL — ABNORMAL HIGH (ref 70–99)
Glucose-Capillary: 160 mg/dL — ABNORMAL HIGH (ref 70–99)
Glucose-Capillary: 253 mg/dL — ABNORMAL HIGH (ref 70–99)
Glucose-Capillary: 72 mg/dL (ref 70–99)

## 2014-05-23 LAB — BASIC METABOLIC PANEL
Anion gap: 10 (ref 5–15)
BUN: 22 mg/dL (ref 6–23)
CO2: 25 mEq/L (ref 19–32)
Calcium: 8.1 mg/dL — ABNORMAL LOW (ref 8.4–10.5)
Chloride: 100 mEq/L (ref 96–112)
Creatinine, Ser: 1.52 mg/dL — ABNORMAL HIGH (ref 0.50–1.35)
GFR calc Af Amer: 50 mL/min — ABNORMAL LOW (ref >90)
GFR calc non Af Amer: 43 mL/min — ABNORMAL LOW (ref >90)
Glucose, Bld: 142 mg/dL — ABNORMAL HIGH (ref 70–99)
Potassium: 5.1 mEq/L (ref 3.7–5.3)
Sodium: 135 mEq/L — ABNORMAL LOW (ref 137–147)

## 2014-05-23 LAB — CBC
HCT: 30.2 % — ABNORMAL LOW (ref 39.0–52.0)
Hemoglobin: 9.8 g/dL — ABNORMAL LOW (ref 13.0–17.0)
MCH: 28.8 pg (ref 26.0–34.0)
MCHC: 32.5 g/dL (ref 30.0–36.0)
MCV: 88.8 fL (ref 78.0–100.0)
Platelets: 111 10*3/uL — ABNORMAL LOW (ref 150–400)
RBC: 3.4 MIL/uL — ABNORMAL LOW (ref 4.22–5.81)
RDW: 14.7 % (ref 11.5–15.5)
WBC: 8.9 10*3/uL (ref 4.0–10.5)

## 2014-05-23 MED ORDER — INSULIN ASPART 100 UNIT/ML ~~LOC~~ SOLN
0.0000 [IU] | Freq: Every day | SUBCUTANEOUS | Status: DC
Start: 2014-05-23 — End: 2014-05-26
  Administered 2014-05-25: 2 [IU] via SUBCUTANEOUS

## 2014-05-23 MED ORDER — INSULIN ASPART 100 UNIT/ML ~~LOC~~ SOLN
0.0000 [IU] | Freq: Three times a day (TID) | SUBCUTANEOUS | Status: DC
  Administered 2014-05-23: 2 [IU] via SUBCUTANEOUS
  Administered 2014-05-23: 8 [IU] via SUBCUTANEOUS
  Administered 2014-05-23 – 2014-05-24 (×3): 3 [IU] via SUBCUTANEOUS
  Administered 2014-05-25: 2 [IU] via SUBCUTANEOUS
  Administered 2014-05-25: 3 [IU] via SUBCUTANEOUS
  Administered 2014-05-26: 2 [IU] via SUBCUTANEOUS
  Administered 2014-05-26: 3 [IU] via SUBCUTANEOUS

## 2014-05-23 NOTE — Progress Notes (Signed)
[facility] DAILY ICU PROGRESS NOTE                   [address]            [place] [address]          [phone]   2 Days Post-Op Procedure(s) (LRB): CORONARY ARTERY BYPASS GRAFTING (CABG) x  four, using left internal mammary artery and right leg greater saphenous vein harvested endoscopically (N/A) INTRAOPERATIVE TRANSESOPHAGEAL ECHOCARDIOGRAM (N/A)  Total Length of Stay:  LOS: 4 days   Subjective: Patient has walked a fair amount today. Has some incisional pain.  Objective: Vital signs in last 24 hours: Temp:  [98.1 F (36.7 C)-98.8 F (37.1 C)] 98.6 F (37 C) (12/20 1600) Pulse Rate:  [63-91] 89 (12/20 1700) Cardiac Rhythm:  [-] Atrial paced (12/20 1200) Resp:  [8-28] 17 (12/20 1700) BP: (101-157)/(47-117) 142/117 mmHg (12/20 1700) SpO2:  [92 %-100 %] 98 % (12/20 1700) Weight:  [176 lb 9.4 oz (80.1 kg)] 176 lb 9.4 oz (80.1 kg) (12/20 0600)  Filed Weights   05/21/14 0321 05/22/14 0500 05/23/14 0600  Weight: 165 lb 12.6 oz (75.2 kg) 177 lb 1.6 oz (80.332 kg) 176 lb 9.4 oz (80.1 kg)    Weight change: -8.2 oz (-0.232 kg)   Hemodynamic parameters for last 24 hours:    Intake/Output from previous day: 12/19 0701 - 12/20 0700 In: 1270.8 [P.O.:200; I.V.:1020.8; IV Piggyback:50] Out: 2075 [Urine:2045; Chest Tube:30]  Intake/Output this shift: Total I/O In: 203 [I.V.:203] Out: 950 [Urine:950]  Current Meds: Scheduled Meds: . acetaminophen  1,000 mg Oral 4 times per day   Or  . acetaminophen (TYLENOL) oral liquid 160 mg/5 mL  1,000 mg Per Tube 4 times per day  . amLODipine  10 mg Oral Daily  . antiseptic oral rinse  7 mL Mouth Rinse BID  . aspirin EC  325 mg Oral Daily   Or  . aspirin  324 mg Per Tube Daily  . atorvastatin  20 mg Oral q1800  . bisacodyl  10 mg Oral Daily   Or  . bisacodyl  10 mg Rectal Daily  . docusate sodium  200 mg Oral Daily  . enoxaparin (LOVENOX) injection  40 mg Subcutaneous QHS  . glimepiride  4 mg Oral BID WC  .  insulin aspart  0-15 Units Subcutaneous TID WC  . insulin aspart  0-5 Units Subcutaneous QHS  . insulin detemir  20 Units Subcutaneous Daily  . linagliptin  5 mg Oral Daily  . metoprolol tartrate  12.5 mg Oral BID   Or  . metoprolol tartrate  12.5 mg Per Tube BID  . pantoprazole  40 mg Oral Daily  . sodium chloride  3 mL Intravenous Q12H   Continuous Infusions: . sodium chloride Stopped (05/22/14 1200)  . sodium chloride    . sodium chloride Stopped (05/22/14 1200)  . dexmedetomidine Stopped (05/21/14 1600)  . lactated ringers 20 mL/hr at 05/23/14 1700  . nitroGLYCERIN Stopped (05/22/14 1545)  . phenylephrine (NEO-SYNEPHRINE) Adult infusion Stopped (05/21/14 1553)   PRN Meds:.metoprolol, midazolam, morphine injection, ondansetron (ZOFRAN) IV, oxyCODONE, sodium chloride, traMADol  General appearance: alert, cooperative and no distress Neurologic: intact Heart: regular rate and rhythm Lungs: Diminished left base and mostly clear on right Abdomen: soft, non-tender; bowel sounds normal; no masses,  no organomegaly Extremities: Mild LE edema Wound: Dressing is clean and dry  Lab Results: CBC: Recent Labs  05/22/14 1813 05/22/14 1815 05/23/14 0438  WBC 10.1  --  8.9  HGB 9.4* 10.2* 9.8*  HCT 30.0* 30.0* 30.2*  PLT 124*  --  111*   BMET:  Recent Labs  05/22/14 0430  05/22/14 1815 05/23/14 0438  NA 133*  --  133* 135*  K 4.7  --  4.8 5.1  CL 101  --  96 100  CO2 23  --   --  25  GLUCOSE 115*  --  149* 142*  BUN 14  --  18 22  CREATININE 1.26  < > 1.70* 1.52*  CALCIUM 8.1*  --   --  8.1*  < > = values in this interval not displayed.  PT/INR:  Recent Labs  05/21/14 1300  LABPROT 17.6*  INR 1.43   Radiology: Dg Chest Port 1 View  05/23/2014   CLINICAL DATA:  Status post CABG  EXAM: PORTABLE CHEST - 1 VIEW  COMPARISON:  05/22/2014  FINDINGS: Interval removal of the left chest tube. Interval removal of the Swan-Ganz catheter. Right jugular sheath in satisfactory  position. Left basilar airspace disease and trace left pleural effusion. Clear right lung. No pneumothorax. Stable cardiomediastinal silhouette. Changes from CABG noted. No acute osseous abnormality.  IMPRESSION: 1. Left basilar airspace disease likely reflecting atelectasis with a trace left pleural effusion. 2. No pulmonary edema.   Electronically Signed   By: Kathreen Devoid   On: 05/23/2014 08:33   Dg Chest Port 1 View  05/22/2014   CLINICAL DATA:  Status post CABG surgery, day 4.  EXAM: PORTABLE CHEST - 1 VIEW  COMPARISON:  05/21/2014  FINDINGS: No mediastinal widening. There is no pneumothorax. Increased left lung base opacity likely due to atelectasis. Lungs otherwise clear. Possible small effusion.  Endotracheal tube and nasogastric tube have been removed.  Right internal jugular Swan-Ganz catheter, left chest tube and mediastinal tube remain in place and well-positioned. Swan-Ganz catheter tip in the right pulmonary artery.  IMPRESSION: 1. Increased left lung base opacity consistent with atelectasis. 2. No pulmonary edema or pneumothorax.  No mediastinal widening.   Electronically Signed   By: Lajean Manes M.D.   On: 05/22/2014 07:27     Assessment/Plan: S/P Procedure(s) (LRB): CORONARY ARTERY BYPASS GRAFTING (CABG) x  four, using left internal mammary artery and right leg greater saphenous vein harvested endoscopically (N/A) INTRAOPERATIVE TRANSESOPHAGEAL ECHOCARDIOGRAM (N/A)  1. CV-SR. On Lopressor 12.5 bid and and Norvasc 10 daily. Unable to restart Maxzide at this time. Monitor BP as may need another medicine. 2. Pulmonary-on 1 liter of oxygen via [place]. Wean as tolerates. CXR shows no pneumothorax, small left pleural effusion, right lung is clear . Encourage incentive spirometer. 3. Volume overload-if creatinine decreased in am, start daily Lasix. 4. ABL anemia- H and H stable at 9.8 and 30.2 5. DM-CBGs 138/253/160. On  Amaryl 4 bid, Tradjenta 5 daily, and Insulin. Not on Metformin yet as  creatinine still elevated. Pre op HGA1C 7.6. 6. Mild thrombocytopenia-platelets down to 111,000 7. Creatinine down from 1.7 to 1.52 8. Encouraged patient to take Oxy or Ultram as needed for pain 9. Possibly transfer in am  Arnoldo Lenis 05/23/2014 6:10 PM

## 2014-05-24 ENCOUNTER — Encounter (HOSPITAL_COMMUNITY): Payer: Self-pay | Admitting: Thoracic Surgery (Cardiothoracic Vascular Surgery)

## 2014-05-24 ENCOUNTER — Inpatient Hospital Stay (HOSPITAL_COMMUNITY): Payer: Commercial Managed Care - HMO

## 2014-05-24 LAB — TYPE AND SCREEN
ABO/RH(D): O POS
Antibody Screen: NEGATIVE
Unit division: 0
Unit division: 0
Unit division: 0
Unit division: 0
Unit division: 0
Unit division: 0

## 2014-05-24 LAB — CBC
HCT: 32.4 % — ABNORMAL LOW (ref 39.0–52.0)
Hemoglobin: 10.1 g/dL — ABNORMAL LOW (ref 13.0–17.0)
MCH: 27.4 pg (ref 26.0–34.0)
MCHC: 31.2 g/dL (ref 30.0–36.0)
MCV: 87.8 fL (ref 78.0–100.0)
Platelets: 124 10*3/uL — ABNORMAL LOW (ref 150–400)
RBC: 3.69 MIL/uL — ABNORMAL LOW (ref 4.22–5.81)
RDW: 14.8 % (ref 11.5–15.5)
WBC: 8 10*3/uL (ref 4.0–10.5)

## 2014-05-24 LAB — BASIC METABOLIC PANEL
Anion gap: 8 (ref 5–15)
BUN: 27 mg/dL — ABNORMAL HIGH (ref 6–23)
CO2: 27 mEq/L (ref 19–32)
Calcium: 8.8 mg/dL (ref 8.4–10.5)
Chloride: 105 mEq/L (ref 96–112)
Creatinine, Ser: 1.46 mg/dL — ABNORMAL HIGH (ref 0.50–1.35)
GFR calc Af Amer: 52 mL/min — ABNORMAL LOW (ref >90)
GFR calc non Af Amer: 45 mL/min — ABNORMAL LOW (ref >90)
Glucose, Bld: 71 mg/dL (ref 70–99)
Potassium: 5 mEq/L (ref 3.7–5.3)
Sodium: 140 mEq/L (ref 137–147)

## 2014-05-24 LAB — GLUCOSE, CAPILLARY
Glucose-Capillary: 63 mg/dL — ABNORMAL LOW (ref 70–99)
Glucose-Capillary: 137 mg/dL — ABNORMAL HIGH (ref 70–99)
Glucose-Capillary: 165 mg/dL — ABNORMAL HIGH (ref 70–99)
Glucose-Capillary: 155 mg/dL — ABNORMAL HIGH (ref 70–99)
Glucose-Capillary: 197 mg/dL — ABNORMAL HIGH (ref 70–99)

## 2014-05-24 MED ORDER — FUROSEMIDE 40 MG PO TABS
40.0000 mg | ORAL_TABLET | Freq: Every day | ORAL | Status: DC
  Administered 2014-05-24: 40 mg via ORAL
  Filled 2014-05-24 (×2): qty 1

## 2014-05-24 MED ORDER — LISINOPRIL 20 MG PO TABS
20.0000 mg | ORAL_TABLET | Freq: Every day | ORAL | Status: DC
  Administered 2014-05-24 – 2014-05-26 (×3): 20 mg via ORAL
  Filled 2014-05-24 (×3): qty 1

## 2014-05-24 MED ORDER — MOVING RIGHT ALONG BOOK
Freq: Once | Status: AC
  Filled 2014-05-24: qty 1

## 2014-05-24 MED ORDER — MAGNESIUM HYDROXIDE 400 MG/5ML PO SUSP: 30.0000 mL | Freq: Every day | ORAL | Status: DC | PRN

## 2014-05-24 MED ORDER — SODIUM CHLORIDE 0.9 % IV SOLN: 250.0000 mL | INTRAVENOUS | Status: DC | PRN

## 2014-05-24 MED ORDER — ALUM & MAG HYDROXIDE-SIMETH 200-200-20 MG/5ML PO SUSP: 15.0000 mL | ORAL | Status: DC | PRN

## 2014-05-24 MED ORDER — SODIUM CHLORIDE 0.9 % IJ SOLN: 3.0000 mL | INTRAMUSCULAR | Status: DC | PRN

## 2014-05-24 MED ORDER — METFORMIN HCL 500 MG PO TABS
1000.0000 mg | ORAL_TABLET | Freq: Two times a day (BID) | ORAL | Status: DC
  Administered 2014-05-24 – 2014-05-25 (×4): 1000 mg via ORAL
  Filled 2014-05-24 (×7): qty 2

## 2014-05-24 MED ORDER — SODIUM CHLORIDE 0.9 % IJ SOLN
3.0000 mL | Freq: Two times a day (BID) | INTRAMUSCULAR | Status: DC
  Administered 2014-05-24 – 2014-05-26 (×4): 3 mL via INTRAVENOUS

## 2014-05-24 MED ORDER — ALPRAZOLAM 0.25 MG PO TABS: 0.2500 mg | ORAL_TABLET | Freq: Four times a day (QID) | ORAL | Status: DC | PRN

## 2014-05-24 MED ORDER — GUAIFENESIN-DM 100-10 MG/5ML PO SYRP: 15.0000 mL | ORAL_SOLUTION | ORAL | Status: DC | PRN

## 2014-05-24 MED FILL — Magnesium Sulfate Inj 50%: INTRAMUSCULAR | Qty: 10 | Status: AC

## 2014-05-24 MED FILL — Potassium Chloride Inj 2 mEq/ML: INTRAVENOUS | Qty: 40 | Status: AC

## 2014-05-24 MED FILL — Heparin Sodium (Porcine) Inj 1000 Unit/ML: INTRAMUSCULAR | Qty: 30 | Status: AC

## 2014-05-24 NOTE — Anesthesia Postprocedure Evaluation (Signed)
  Anesthesia Post-op Note  Patient: Justin Salinas  Procedure(s) Performed: Procedure(s): CORONARY ARTERY BYPASS GRAFTING (CABG) x  four, using left internal mammary artery and right leg greater saphenous vein harvested endoscopically (N/A) INTRAOPERATIVE TRANSESOPHAGEAL ECHOCARDIOGRAM (N/A)  Patient Location: PACU and SICU  Anesthesia Type:General  Level of Consciousness: Patient remains intubated per anesthesia plan  Airway and Oxygen Therapy: Patient remains intubated per anesthesia plan  Post-op Pain: mild  Post-op Assessment: Post-op Vital signs reviewed  Post-op Vital Signs: Reviewed  Last Vitals:  Filed Vitals:   05/24/14 0700  BP:   Pulse: 68  Temp:   Resp: 18    Complications: No apparent anesthesia complications

## 2014-05-24 NOTE — Progress Notes (Signed)
3 Days Post-Op Procedure(s) (LRB): CORONARY ARTERY BYPASS GRAFTING (CABG) x  four, using left internal mammary artery and right leg greater saphenous vein harvested endoscopically (N/A) INTRAOPERATIVE TRANSESOPHAGEAL ECHOCARDIOGRAM (N/A) Subjective: Some incisional pain- "not too bad"  Objective: Vital signs in last 24 hours: Temp:  [98 F (36.7 C)-99.2 F (37.3 C)] 98.4 F (36.9 C) (12/21 0400) Pulse Rate:  [64-89] 66 (12/21 0800) Cardiac Rhythm:  [-] Normal sinus rhythm (12/21 0800) Resp:  [8-28] 16 (12/21 0800) BP: (123-164)/(46-117) 154/67 mmHg (12/21 0800) SpO2:  [91 %-100 %] 96 % (12/21 0800) Weight:  [174 lb 14.4 oz (79.334 kg)] 174 lb 14.4 oz (79.334 kg) (12/21 0600)  Hemodynamic parameters for last 24 hours:    Intake/Output from previous day: 12/20 0701 - 12/21 0700 In: 603 [P.O.:120; I.V.:483] Out: 2550 [Urine:2550] Intake/Output this shift: Total I/O In: 20 [I.V.:20] Out: 150 [Urine:150]  General appearance: alert and no distress Neurologic: intact Heart: regular rate and rhythm Lungs: diminished breath sounds bibasilar Wound: clean and dry  Lab Results:  Recent Labs  05/23/14 0438 05/24/14 0324  WBC 8.9 8.0  HGB 9.8* 10.1*  HCT 30.2* 32.4*  PLT 111* 124*   BMET:  Recent Labs  05/23/14 0438 05/24/14 0324  NA 135* 140  K 5.1 5.0  CL 100 105  CO2 25 27  GLUCOSE 142* 71  BUN 22 27*  CREATININE 1.52* 1.46*  CALCIUM 8.1* 8.8    PT/INR:  Recent Labs  05/21/14 1300  LABPROT 17.6*  INR 1.43   ABG    Component Value Date/Time   PHART 7.325* 05/21/2014 1948   HCO3 23.1 05/21/2014 1948   TCO2 21 05/22/2014 1815   ACIDBASEDEF 3.0* 05/21/2014 1948   O2SAT 95.0 05/21/2014 1948   CBG (last 3)   Recent Labs  05/23/14 1634 05/23/14 2202 05/24/14 0754  GLUCAP 160* 72 63*    Assessment/Plan: S/P Procedure(s) (LRB): CORONARY ARTERY BYPASS GRAFTING (CABG) x  four, using left internal mammary artery and right leg greater saphenous vein  harvested endoscopically (N/A) INTRAOPERATIVE TRANSESOPHAGEAL ECHOCARDIOGRAM (N/A) Plan for transfer to step-down: see transfer orders  Doing well POD # 3 Creatinine improving CV stable CBG variable will adjust DM meds transfer to 2000   LOS: 5 days    Tynan Boesel C 05/24/2014

## 2014-05-24 NOTE — Progress Notes (Signed)
CARDIAC REHAB PHASE I   PRE:  Rate/Rhythm: 76 SR  BP:  Supine:   Sitting: 150/70  Standing:    SaO2: 95%RA  MODE:  Ambulation: 950 ft   POST:  Rate/Rhythm: 84 SR  BP:  Supine:   Sitting: 150/80  Standing:    SaO2: 97%RA 1340-1435 Pt wanted to walk farther and he increased distance to 950 ft and tolerated well. Used rolling walker with little asst. Steady. To bathroom and then to recliner. Third walk. Wants to walk again tonight. Encouraged him to walk with someone. Walker in room.   Graylon Good, RN BSN  05/24/2014 2:31 PM

## 2014-05-25 ENCOUNTER — Inpatient Hospital Stay (HOSPITAL_COMMUNITY): Payer: Commercial Managed Care - HMO

## 2014-05-25 LAB — GLUCOSE, CAPILLARY
Glucose-Capillary: 153 mg/dL — ABNORMAL HIGH (ref 70–99)
Glucose-Capillary: 149 mg/dL — ABNORMAL HIGH (ref 70–99)
Glucose-Capillary: 89 mg/dL (ref 70–99)

## 2014-05-25 LAB — BASIC METABOLIC PANEL
Anion gap: 3 — ABNORMAL LOW (ref 5–15)
BUN: 26 mg/dL — ABNORMAL HIGH (ref 6–23)
CO2: 30 mmol/L (ref 19–32)
Calcium: 8.3 mg/dL — ABNORMAL LOW (ref 8.4–10.5)
Chloride: 105 mEq/L (ref 96–112)
Creatinine, Ser: 1.43 mg/dL — ABNORMAL HIGH (ref 0.50–1.35)
GFR calc Af Amer: 54 mL/min — ABNORMAL LOW (ref >90)
GFR calc non Af Amer: 46 mL/min — ABNORMAL LOW (ref >90)
Glucose, Bld: 151 mg/dL — ABNORMAL HIGH (ref 70–99)
Potassium: 4.5 mmol/L (ref 3.5–5.1)
Sodium: 138 mmol/L (ref 135–145)

## 2014-05-25 MED ORDER — LIDOCAINE HCL (PF) 1 % IJ SOLN
INTRAMUSCULAR | Status: AC
  Filled 2014-05-25: qty 10

## 2014-05-25 MED ORDER — FUROSEMIDE 40 MG PO TABS
40.0000 mg | ORAL_TABLET | Freq: Every day | ORAL | Status: DC
  Administered 2014-05-25 – 2014-05-26 (×2): 40 mg via ORAL
  Filled 2014-05-25 (×2): qty 1

## 2014-05-25 MED ORDER — METOPROLOL TARTRATE 25 MG PO TABS
25.0000 mg | ORAL_TABLET | Freq: Two times a day (BID) | ORAL | Status: DC
  Administered 2014-05-25 – 2014-05-26 (×3): 25 mg via ORAL
  Filled 2014-05-25 (×4): qty 1

## 2014-05-25 MED ORDER — LACTULOSE 10 GM/15ML PO SOLN
20.0000 g | Freq: Every day | ORAL | Status: DC | PRN
  Filled 2014-05-25: qty 30

## 2014-05-25 MED ORDER — FUROSEMIDE 40 MG PO TABS
40.0000 mg | ORAL_TABLET | Freq: Two times a day (BID) | ORAL | Status: DC
  Filled 2014-05-25 (×2): qty 1

## 2014-05-25 NOTE — Progress Notes (Signed)
Medicare Important Message given? YES  (If response is "NO", the following Medicare IM given date fields will be blank)  Date Medicare IM given: 05/25/14 Medicare IM given by:  Cinch Ormond  

## 2014-05-25 NOTE — Progress Notes (Signed)
1028 Pt stated he has walked already this morning. Has company now. Will follow up in pm. Graylon Good RN BSN 05/25/2014 10:20 AM

## 2014-05-25 NOTE — Procedures (Signed)
  US guided L thora 600 cc bloody fluid  Pt did well  cxr pending

## 2014-05-25 NOTE — Progress Notes (Signed)
[id number] Observed pt up walking independently with girl friend with steady gait. Completed education with both who voiced understanding. Encouraged IS. Gave diabetic and heart healthy diets. Discussed CRP 2 and pt gave permission to refer to [facility] program. Pt stated does not need walker. Graylon Good RN BSN 05/25/2014 11:44 AM

## 2014-05-25 NOTE — Progress Notes (Addendum)
       [address]       [place] [address]             [phone]          4 Days Post-Op Procedure(s) (LRB): CORONARY ARTERY BYPASS GRAFTING (CABG) x  four, using left internal mammary artery and right leg greater saphenous vein harvested endoscopically (N/A) INTRAOPERATIVE TRANSESOPHAGEAL ECHOCARDIOGRAM (N/A)  Subjective: Feels "better every day".  Main complaint is constipation.   Objective: Vital signs in last 24 hours: Patient Vitals for the past 24 hrs:  BP Temp Temp src Pulse Resp SpO2 Weight  05/25/14 0555 127/68 mmHg 98.2 F (36.8 C) Oral 67 18 96 % 167 lb 8.8 oz (76 kg)  05/24/14 2023 (!) 142/65 mmHg 97.7 F (36.5 C) Oral 79 20 100 % -  05/24/14 1111 (!) 155/60 mmHg 98.5 F (36.9 C) Oral 75 20 98 % -  05/24/14 1000 (!) 164/69 mmHg - - 72 16 100 % -  05/24/14 0900 (!) 146/57 mmHg - - 74 14 97 % -   Current Weight  05/25/14 167 lb 8.8 oz (76 kg)   PRE-OPERATIVE WEIGHT: 75 kg   Intake/Output from previous day: 12/21 0701 - 12/22 0700 In: 720 [P.O.:720] Out: 3000 [Urine:3000]  CBGs [id number]   PHYSICAL EXAM:  Heart: RRR Lungs: Decreased BS in bases Wound: Clean and dry Extremities: Trace RLE edema    Lab Results: CBC: Recent Labs  05/23/14 0438 05/24/14 0324  WBC 8.9 8.0  HGB 9.8* 10.1*  HCT 30.2* 32.4*  PLT 111* 124*   BMET:  Recent Labs  05/24/14 0324 05/25/14 0550  NA 140 138  K 5.0 4.5  CL 105 105  CO2 27 30  GLUCOSE 71 151*  BUN 27* 26*  CREATININE 1.46* 1.43*  CALCIUM 8.8 8.3*    PT/INR: No results for input(s): LABPROT, INR in the last 72 hours.  CXR: FINDINGS: Lungs are adequately inflated with a moderate size left pleural effusion and small right pleural effusion which are worse. There is likely associated atelectasis in the lung bases. Cardiomediastinal silhouette is unremarkable. There is calcified plaque over the thoracoabdominal aorta. Sternotomy wires are unchanged. Remainder of the exam  is unchanged.  IMPRESSION: Worsening moderate size left effusion and small right effusion likely with associated bibasilar atelectasis.   Assessment/Plan: S/P Procedure(s) (LRB): CORONARY ARTERY BYPASS GRAFTING (CABG) x  four, using left internal mammary artery and right leg greater saphenous vein harvested endoscopically (N/A) INTRAOPERATIVE TRANSESOPHAGEAL ECHOCARDIOGRAM (N/A)  CV- BPs trending up, will increase beta blocker. Continue Norvasc, Lisinopril.    DM- sugars generally stable. Continue Amaryl, Tradjenta, not able to resume Metformin at this point due to elevated creatinine.  Vol overload/Bilateral effusions- Not really edematous on exam, wt only 1 kg above pre-op, but L sided effusion worse on today's CXR.  Pt off oxygen with good sats, denies dyspnea.  Discussed with MD and will order U/s guided thoracentesis. Will give additional Lasix today and watch  Renal- Cr stable, UOP excellent. Continue to monitor.  LOC today.  CRPI, pulm toilet.   LOS: 6 days    COLLINS,GINA H 05/25/2014  Patient seen and examined agree with above Left effusion more evident on PA and Lateral today- will have U/S guided thoracentesis

## 2014-05-26 ENCOUNTER — Inpatient Hospital Stay (HOSPITAL_COMMUNITY): Payer: Commercial Managed Care - HMO

## 2014-05-26 LAB — BASIC METABOLIC PANEL
Anion gap: 6 (ref 5–15)
BUN: 30 mg/dL — ABNORMAL HIGH (ref 6–23)
CO2: 28 mmol/L (ref 19–32)
Calcium: 8.6 mg/dL (ref 8.4–10.5)
Chloride: 100 mEq/L (ref 96–112)
Creatinine, Ser: 1.56 mg/dL — ABNORMAL HIGH (ref 0.50–1.35)
GFR calc Af Amer: 48 mL/min — ABNORMAL LOW (ref >90)
GFR calc non Af Amer: 42 mL/min — ABNORMAL LOW (ref >90)
Glucose, Bld: 128 mg/dL — ABNORMAL HIGH (ref 70–99)
Potassium: 3.9 mmol/L (ref 3.5–5.1)
Sodium: 134 mmol/L — ABNORMAL LOW (ref 135–145)

## 2014-05-26 LAB — GLUCOSE, CAPILLARY
Glucose-Capillary: 150 mg/dL — ABNORMAL HIGH (ref 70–99)
Glucose-Capillary: 143 mg/dL — ABNORMAL HIGH (ref 70–99)
Glucose-Capillary: 193 mg/dL — ABNORMAL HIGH (ref 70–99)

## 2014-05-26 MED ORDER — ASPIRIN 325 MG PO TBEC: 325.0000 mg | DELAYED_RELEASE_TABLET | Freq: Every day | ORAL | Status: DC

## 2014-05-26 MED ORDER — OXYCODONE HCL 5 MG PO TABS: 5.0000 mg | ORAL_TABLET | ORAL | Status: DC | PRN

## 2014-05-26 MED ORDER — AMLODIPINE BESYLATE 10 MG PO TABS: 10.0000 mg | ORAL_TABLET | Freq: Every day | ORAL | Status: DC

## 2014-05-26 MED ORDER — METOPROLOL TARTRATE 25 MG PO TABS: 25.0000 mg | ORAL_TABLET | Freq: Two times a day (BID) | ORAL | Status: DC

## 2014-05-26 MED ORDER — FUROSEMIDE 40 MG PO TABS: 40.0000 mg | ORAL_TABLET | Freq: Every day | ORAL | Status: DC

## 2014-05-26 NOTE — Progress Notes (Signed)
PHARMACIST - PHYSICIAN COMMUNICATION DR:  Roxan Hockey CONCERNING:  METFORMIN SAFE ADMINISTRATION POLICY  RECOMMENDATION: Metformin has been placed on DISCONTINUE (rejected order) STATUS and should be reordered only after any of the conditions below are ruled out.  Current safety recommendations include avoiding metformin for a minimum of 48 hours after the patient's exposure to intravenous contrast media.  DESCRIPTION:  The Pharmacy Committee has adopted a policy that restricts the use of metformin in hospitalized patients until all the contraindications to administration have been ruled out. Specific contraindications are: [x]  Serum creatinine ? 1.5 for males []  Serum creatinine ? 1.4 for females []  Shock, acute MI, sepsis, hypoxemia, dehydration []  Planned administration of intravenous iodinated contrast media []  Heart Failure patients with low EF []  Acute or chronic metabolic acidosis (including DKA)      Alycia Rossetti, PharmD, BCPS Clinical Pharmacist Pager: [phone] 05/26/2014 8:40 AM

## 2014-05-26 NOTE — Progress Notes (Signed)
EPW's and CT sutures DC'd per order and unit protocol.  Pt tolerate very well.  VSS stable, see flowsheet.  All tips intact, sites painted, steri's applied.  Pt understands bedrest for on hr with frequent VS checks.  CCMD notified, wife at side, call bell in reach.  Will monitor closely.

## 2014-05-26 NOTE — Discharge Summary (Signed)
[address]       [place] [address]             [phone]              Discharge Summary  Name: Justin Salinas DOB: 1940/11/28 73 y.o. MRN: [id number]   Admission Date: 05/19/2014 Discharge Date: 05/26/2014    Admitting Diagnosis: Chest pain   Discharge Diagnosis:  Severe 2 vessel coronary artery disease Expected postoperative blood loss anemia Left pleural effusion Chronic kidney disease 40-59% left ICA stenosis   Past Medical History  Diagnosis Date  . Allergy   . Diabetes     AODM  . Asthma   . Hyperlipidemia   . Hypertension   . MI (myocardial infarction) 09/22/1993  . CAD (coronary artery disease)   . BPH (benign prostatic hyperplasia)   . GERD (gastroesophageal reflux disease)   . Ischemic chest pain 05/19/2014      Procedures: CORONARY ARTERY BYPASS GRAFTING x 4  - 05/21/2014  Free LIMA to LAD (proximal off hood of OM1 vein)  SVG to D1  SVG to OM1  SVG to Acute marginal ENDOSCOPIC VEIN HARVEST RIGHT LEG    HPI:  The patient is a 73 y.o. male with a known history of CAD, status post PCI to the RCA in 1995.  This was later noted to be occluded in 2000.  At that time, he also had moderate left system disease, but he has done well on medical therapy. Recently, the patient was seen by his primary care MD with complaints of exertional chest pain and dyspnea which has been progressive over the past 2 months.  He was referred to Cardiology, and his symptoms were felt to be worrisome for angina.  He was scheduled for outpatient catheterization, which was performed on 12/16 by Dr. Burt Knack and revealed a chronically occluded RCA, a tight ostial LAD stenosis, and moderate disease in the short left main, left circumflex and diagonal 1. The patient was admitted following catheterization for further evaluation and treatment.    Hospital Course:  The patient was admitted to [facility] on 05/19/2014.  A cardiac surgery consult  was requested and Dr. Roxan Hockey saw the patient.  It was felt that the patient would benefit from CABG for survival benefit and relief of symptoms.  All risks, benefits and alternatives of surgery were explained in detail, and the patient agreed to proceed.   The patient was taken to the operating room and underwent the above procedure.    The postoperative course has generally been uneventful.  His blood pressures have slowly been trending up and medications have been titrated accordingly. He did develop a moderate left pleural effusion and required an ultrasound guided thoracentesis on postop day 4, yielding 600 ml of bloody fluid.      He has otherwise remained stable. He is maintaining sinus rhythm.  Incisions are healing well.  The patient is ambulating in the halls without difficulty.  He has been mildly volume overloaded and was started on Lasix, to which he is responding well.  His creatinine did bump up following surgery, with a peak of 1.7, but has remained stable and is trending down to his baseline. Blood sugars have been controlled on home medications. He is tolerating a regular diet. The patient has been evaluated on today's date and is medically stable for discharge home.    Recent vital signs:  Filed Vitals:   05/26/14 0517  BP: 118/53  Pulse: 61  Temp: 98 F (36.7 C)  Resp: 18    Recent laboratory studies:  CBC:  Recent Labs  05/24/14 0324  WBC 8.0  HGB 10.1*  HCT 32.4*  PLT 124*   BMET:   Recent Labs  05/25/14 0550 05/26/14 0248  NA 138 134*  K 4.5 3.9  CL 105 100  CO2 30 28  GLUCOSE 151* 128*  BUN 26* 30*  CREATININE 1.43* 1.56*  CALCIUM 8.3* 8.6    PT/INR: No results for input(s): LABPROT, INR in the last 72 hours.    Discharge Medications:     Medication List    STOP taking these medications        aspirin 81 MG tablet  Replaced by:  aspirin 325 MG EC tablet     triamterene-hydrochlorothiazide 37.5-25 MG per capsule  Commonly known  as:  DYAZIDE     triamterene-hydrochlorothiazide 37.5-25 MG per tablet  Commonly known as:  MAXZIDE-25      TAKE these medications        ACCU-CHEK AVIVA PLUS test strip  Generic drug:  glucose blood     ACCU-CHEK SOFTCLIX LANCET DEV Kit     amLODipine 10 MG tablet  Commonly known as:  NORVASC  Take 1 tablet (10 mg total) by mouth daily.     aspirin 325 MG EC tablet  Take 1 tablet (325 mg total) by mouth daily.     CENTRUM SILVER ADULT 50+ PO  Take by mouth.     furosemide 40 MG tablet  Commonly known as:  LASIX  Take 1 tablet (40 mg total) by mouth daily. X 5 days     glimepiride 4 MG tablet  Commonly known as:  AMARYL  Take 4 mg by mouth 2 (two) times daily.     JANUVIA 100 MG tablet  Generic drug:  sitaGLIPtin  Take 100 mg by mouth daily.     lisinopril 20 MG tablet  Commonly known as:  PRINIVIL  Take 1 tablet (20 mg total) by mouth daily.     metFORMIN 1000 MG tablet  Commonly known as:  GLUCOPHAGE  Take 1,000 mg by mouth 2 (two) times daily with a meal.     metoprolol tartrate 25 MG tablet  Commonly known as:  LOPRESSOR  Take 1 tablet (25 mg total) by mouth 2 (two) times daily.     nitroGLYCERIN 0.4 MG SL tablet  Commonly known as:  NITROSTAT  Place 1 tablet (0.4 mg total) under the tongue every 5 (five) minutes as needed for chest pain.     oxyCODONE 5 MG immediate release tablet  Commonly known as:  Oxy IR/ROXICODONE  Take 1-2 tablets (5-10 mg total) by mouth every 3 (three) hours as needed for severe pain.     simvastatin 40 MG tablet  Commonly known as:  ZOCOR  Take 1 tablet (40 mg total) by mouth every evening.     vitamin B-12 100 MCG tablet  Commonly known as:  CYANOCOBALAMIN  Take 50 mcg by mouth daily.         Discharge Instructions:  The patient is to refrain from driving, heavy lifting or strenuous activity.  May shower daily and clean incisions with soap and water.  May resume regular diet.   Follow Up:  Discharge Instructions      Amb Referral to Cardiac Rehabilitation    Complete by:  As directed           Follow-up Information  Follow up with Melrose Nakayama, MD On 06/22/2014.   Specialty:  Cardiothoracic Surgery   Why:  Have a chest x-ray at [place] at 10:00, then see MD at 11:00   Contact information:   [place] [address] [phone]       Follow up with [name], MD.   Specialty:  Cardiology   Why:  Office will arrange a follow up appointment   Contact information:   [address] [place] [address] [phone]      The patient has been discharged on:  1.Beta Blocker: Yes [ x ]  No [ ]   If No, reason:    2.Ace Inhibitor/ARB: Yes [ ]   No [ x ]  If No, reason: CKD   3.Statin: Yes [ x ]  No [ ]   If No, reason:    4.Ecasa: Yes [ x ]  No [ ]   If No, reason:    COLLINS,GINA H 05/26/2014, 12:51 PM

## 2014-05-26 NOTE — Progress Notes (Addendum)
       [address]       [place] [address]             [phone]          5 Days Post-Op Procedure(s) (LRB): CORONARY ARTERY BYPASS GRAFTING (CABG) x  four, using left internal mammary artery and right leg greater saphenous vein harvested endoscopically (N/A) INTRAOPERATIVE TRANSESOPHAGEAL ECHOCARDIOGRAM (N/A)  Subjective: Feels well, hoping to go home.     Objective: Vital signs in last 24 hours: Patient Vitals for the past 24 hrs:  BP Temp Temp src Pulse Resp SpO2 Weight  05/26/14 0517 (!) 118/53 mmHg 98 F (36.7 C) Oral 61 18 96 % 163 lb 6.4 oz (74.118 kg)  05/25/14 2046 (!) 120/31 mmHg 98.4 F (36.9 C) Oral 67 18 95 % -  05/25/14 1500 130/87 mmHg 98 F (36.7 C) Oral 71 20 100 % -  05/25/14 1409 (!) 128/57 mmHg - - - - - -  05/25/14 1353 (!) 122/57 mmHg - - - - - -   Current Weight  05/26/14 163 lb 6.4 oz (74.118 kg)   PRE-OPERATIVE WEIGHT: 75 kg  Intake/Output from previous day: 12/22 0701 - 12/23 0700 In: 240 [P.O.:240] Out: 1425 [Urine:1425]  CBGs 89-150-128-128-143   PHYSICAL EXAM:  Heart: RRR Lungs: Slightly decreased BS in bases Wound: Clean and dry Extremities: No edema    Lab Results: CBC: Recent Labs  05/24/14 0324  WBC 8.0  HGB 10.1*  HCT 32.4*  PLT 124*   BMET:  Recent Labs  05/25/14 0550 05/26/14 0248  NA 138 134*  K 4.5 3.9  CL 105 100  CO2 30 28  GLUCOSE 151* 128*  BUN 26* 30*  CREATININE 1.43* 1.56*  CALCIUM 8.3* 8.6    PT/INR: No results for input(s): LABPROT, INR in the last 72 hours.    Assessment/Plan: S/P Procedure(s) (LRB): CORONARY ARTERY BYPASS GRAFTING (CABG) x  four, using left internal mammary artery and right leg greater saphenous vein harvested endoscopically (N/A) INTRAOPERATIVE TRANSESOPHAGEAL ECHOCARDIOGRAM (N/A)  CV- BPs improved, continue beta blocker, Norvasc, Lisinopril.   DM- sugars stable. Continue Amaryl, Tradjenta, will resume Metformin at discharge.  Left  effusion- thoracentesis yielded 600 ml fluid yesterday.  CXR shows resolution today.    CKD- Cr generally stable.  Will d/c EPWs this am. Hopefully home later today if he remains stable.   LOS: 7 days    COLLINS,GINA H 05/26/2014  Patient seen and examined, agree with above Home today

## 2014-06-18 ENCOUNTER — Other Ambulatory Visit: Payer: Self-pay | Admitting: Thoracic Surgery (Cardiothoracic Vascular Surgery)

## 2014-06-18 DIAGNOSIS — I679 Cerebrovascular disease, unspecified: Secondary | ICD-10-CM

## 2014-06-21 ENCOUNTER — Ambulatory Visit
Admission: RE | Admit: 2014-06-21 | Discharge: 2014-06-21 | Disposition: A | Discharge status: Home / Self Care | Payer: Commercial Managed Care - HMO | Source: Ambulatory Visit | Attending: Thoracic Surgery (Cardiothoracic Vascular Surgery) | Admitting: Thoracic Surgery (Cardiothoracic Vascular Surgery)

## 2014-06-21 ENCOUNTER — Ambulatory Visit (INDEPENDENT_AMBULATORY_CARE_PROVIDER_SITE_OTHER): Payer: Self-pay | Admitting: Physician Assistant

## 2014-06-21 VITALS — BP 133/61 | HR 58 | Resp 16 | Ht 71.0 in | Wt 171.0 lb

## 2014-06-21 DIAGNOSIS — I2510 Atherosclerotic heart disease of native coronary artery without angina pectoris: Secondary | ICD-10-CM

## 2014-06-21 DIAGNOSIS — I679 Cerebrovascular disease, unspecified: Secondary | ICD-10-CM

## 2014-06-21 DIAGNOSIS — Z951 Presence of aortocoronary bypass graft: Secondary | ICD-10-CM

## 2014-06-21 DIAGNOSIS — R0989 Other specified symptoms and signs involving the circulatory and respiratory systems: Secondary | ICD-10-CM | POA: Diagnosis not present

## 2014-06-21 DIAGNOSIS — R05 Cough: Secondary | ICD-10-CM | POA: Diagnosis not present

## 2014-06-21 MED ORDER — METOPROLOL TARTRATE 25 MG PO TABS: 12.5000 mg | ORAL_TABLET | Freq: Two times a day (BID) | ORAL | Status: DC

## 2014-06-21 NOTE — Progress Notes (Signed)
  HPI: Patient returns for routine postoperative follow-up having undergone CABG x 4 on 05/21/2014. The patient's early postoperative recovery while in the hospital was notable for Pleural effusion which required thoracentesis. Since hospital discharge the patient reports he is doing well.  He does ask if he can discontinue Lasix because he is tired of running to the bathroom every hour.  He is ambulating without difficulty.  He is tolerating a regular diet.  He questions when he can increase his activity level and I explained to the patient he can walk as much as he can tolerate, but lifting and strenuous exercises should not be resumed until 6 weeks from the date of surgery.   Current Outpatient Prescriptions  Medication Sig Dispense Refill  . ACCU-CHEK AVIVA PLUS test strip     . amLODipine (NORVASC) 10 MG tablet Take 1 tablet (10 mg total) by mouth daily. 30 tablet 1  . aspirin EC 325 MG EC tablet Take 1 tablet (325 mg total) by mouth daily. 30 tablet 0  . glimepiride (AMARYL) 4 MG tablet Take 4 mg by mouth 2 (two) times daily.    Marland Kitchen JANUVIA 100 MG tablet Take 100 mg by mouth daily.     . Lancets Misc. (ACCU-CHEK SOFTCLIX LANCET DEV) KIT     . lisinopril (PRINIVIL) 20 MG tablet Take 1 tablet (20 mg total) by mouth daily. (Patient taking differently: Take 20 mg by mouth daily. TAKING 1 IN THE AM)    . metFORMIN (GLUCOPHAGE) 1000 MG tablet Take 1,000 mg by mouth 2 (two) times daily with a meal.    . metoprolol tartrate (LOPRESSOR) 25 MG tablet Take 0.5 tablets (12.5 mg total) by mouth 2 (two) times daily. 60 tablet 1  . Multiple Vitamins-Minerals (CENTRUM SILVER ADULT 50+ PO) Take by mouth.    . simvastatin (ZOCOR) 40 MG tablet Take 1 tablet (40 mg total) by mouth every evening. 30 tablet 6  . vitamin B-12 (CYANOCOBALAMIN) 100 MCG tablet Take 50 mcg by mouth daily.    . nitroGLYCERIN (NITROSTAT) 0.4 MG SL tablet Place 1 tablet (0.4 mg total) under the tongue every 5 (five) minutes as needed for  chest pain. (Patient not taking: Reported on 06/21/2014) 25 tablet 2   No current facility-administered medications for this visit.    Physical Exam:  BP 133/61 mmHg  Pulse 58  Resp 16  Ht $R'5\' 11"'Gc$  (1.803 m)  Wt 171 lb (77.565 kg)  BMI 23.86 kg/m2  SpO2 97%  Gen: no apparent distress Heart: RRR Lungs: CTA bilaterally Ext: no LE edema appreciated Skin: wounds well healed  Diagnostic Tests:  CXR: no pleural effusion present, post surgical changes  A/P:  1. S/P CABG doing well, he was not contact by Dr. York Cerise office for a follow up appointment.  We will schedule this for the patient 2. Bradycardia- I will decrease his Lopressor to 12.5 mg BID and he will remain on Norvasc and Lisinopril for BP control 3.Edema- resolved, no recurrence of pleural effusion will stop potassium 4. Gave referral for cardiac rehab 5. RTC in 6-8 weeks with Dr. Roxan Hockey

## 2014-06-22 ENCOUNTER — Ambulatory Visit: Payer: Commercial Managed Care - HMO | Admitting: Thoracic Surgery (Cardiothoracic Vascular Surgery)

## 2014-06-29 ENCOUNTER — Encounter: Payer: Self-pay | Admitting: Nurse Practitioner

## 2014-06-29 ENCOUNTER — Ambulatory Visit (INDEPENDENT_AMBULATORY_CARE_PROVIDER_SITE_OTHER): Payer: Commercial Managed Care - HMO | Admitting: Nurse Practitioner

## 2014-06-29 VITALS — BP 114/58 | HR 67 | Ht 70.0 in | Wt 169.8 lb

## 2014-06-29 DIAGNOSIS — I2510 Atherosclerotic heart disease of native coronary artery without angina pectoris: Secondary | ICD-10-CM | POA: Diagnosis not present

## 2014-06-29 DIAGNOSIS — N183 Chronic kidney disease, stage 3 unspecified: Secondary | ICD-10-CM

## 2014-06-29 DIAGNOSIS — I519 Heart disease, unspecified: Secondary | ICD-10-CM | POA: Diagnosis not present

## 2014-06-29 DIAGNOSIS — E1159 Type 2 diabetes mellitus with other circulatory complications: Secondary | ICD-10-CM | POA: Insufficient documentation

## 2014-06-29 DIAGNOSIS — I10 Essential (primary) hypertension: Secondary | ICD-10-CM | POA: Diagnosis not present

## 2014-06-29 DIAGNOSIS — E1122 Type 2 diabetes mellitus with diabetic chronic kidney disease: Secondary | ICD-10-CM | POA: Insufficient documentation

## 2014-06-29 DIAGNOSIS — I5189 Other ill-defined heart diseases: Secondary | ICD-10-CM | POA: Insufficient documentation

## 2014-06-29 DIAGNOSIS — E785 Hyperlipidemia, unspecified: Secondary | ICD-10-CM

## 2014-06-29 DIAGNOSIS — I152 Hypertension secondary to endocrine disorders: Secondary | ICD-10-CM | POA: Insufficient documentation

## 2014-06-29 NOTE — Patient Instructions (Signed)
Your physician recommends that you continue on your current medications as directed. Please refer to the Current Medication list given to you today.    FOLLOW UP IN  3 MONTHS WITH DR Burt Knack

## 2014-06-29 NOTE — Progress Notes (Signed)
Patient Name: Justin Salinas Date of Encounter: 06/29/2014  Primary Care Provider:  Abigail Miyamoto, MD Primary Cardiologist:  Jerilynn Mages. Burt Knack, MD (pts request)  Chief Complaint  74 year old male who presents today for follow-up related to recent coronary artery bypass grafting.   Past Medical History   Past Medical History  Diagnosis Date  . Allergy   . Diabetes     AODM  . Asthma   . Hyperlipidemia   . Essential hypertension   . CAD (coronary artery disease)     a. s/p MI 09/1993;  b. known CTO of RCA;  c. 06/2014 Cath: LM short/mod dzs, LAD 95ost, LCX nl, RI nl, RCA 100, EF 55%;  c. 06/2014 s/p CABG x 4: LIMA->LAD, VG->D1, VG->OM1, VG->Acute Marginal.  . BPH (benign prostatic hyperplasia)   . GERD (gastroesophageal reflux disease)   . Diastolic dysfunction     a. 05/2015 Echo: EF 60-65%, LVH, Gr 1 DD, mild MR, mod dil RA/LA, mod TR, PASP 41mHg.  . CKD (chronic kidney disease), stage III    Past Surgical History  Procedure Laterality Date  . Back surgery  2000    back fusion - Dr. HEarnie Larsson . Vasectomy  1963  . Angioplasty  09/22/1993    POBA of RCA (Dr. RMarella Chimes  . Transthoracic echocardiogram  10/20/2012    EF 55-60%, moderate concentric hypertrophy, ventricular septum thickness increased, calcified MV annulus  . Nm myocar perf wall motion  09/19/2009    bruce myoview - mild perfusion defect in basal inferior region (infarct/scar), EF 60%, low risk scan  . Cardiac catheterization  11/28/1998    "silent occlusion" of RCA w/collaterals from left coronary system, prox LAD w/40-50% eccentric narrowing, 1st diagonal with 70-80% eccentric narrowing, 85% narrowing of prox small OM1 (Dr. RMarella Chimes  . Renal doppler  10/2011    SMA w/ 70-99% diameter reduction & high grade stenosis; R&L renals w/narrowing and increased velocities (60-99%), R kidney smaller than L  . Carotid doppler  07/2011    left subclavian (50-69%); right bulb (0-49%); RICA (normal); left mid-distal CCA  (0-49%); left bulb/prox ICA (50-69%); left vertebral with abnormal antegrade flow  . Nasal sinus surgery  2010  . Left heart catheterization with coronary angiogram N/A 05/19/2014    Procedure: LEFT HEART CATHETERIZATION WITH CORONARY ANGIOGRAM;  Surgeon: MBlane Ohara MD;  [facility];  Service: Cardiovascular;  Laterality: N/A;  . Coronary artery bypass graft N/A 05/21/2014    Procedure: CORONARY ARTERY BYPASS GRAFTING (CABG) x  four, using left internal mammary artery and right leg greater saphenous vein harvested endoscopically;  Surgeon: SMelrose Nakayama MD;  [facility] [facility]  Service: Open Heart Surgery;  Laterality: N/A;  . Intraoperative transesophageal echocardiogram N/A 05/21/2014    Procedure: INTRAOPERATIVE TRANSESOPHAGEAL ECHOCARDIOGRAM;  Surgeon: SMelrose Nakayama MD;  [facility] [facility]  Service: Open Heart Surgery;  Laterality: N/A;   Allergies  No Known Allergies  HPI  74year old male with a prior history of coronary artery disease. He was seen in clinic in mid December with progressive dyspnea and chest pain and subsequently underwent diagnostic catheterization revealing severe ostial LAD disease along with a chronic total occlusion of the right coronary artery. His anatomy was felt to be most amenable to bypass grafting and he was evaluated by thoracic surgery and subsequently underwent CABG 4. Postoperative course was relatively uncomplicated although he did have a pleural effusion which required thoracentesis. He has since been seen back by  surgery and was felt to be doing well. Patient says he's been walking an hour daily and has not been expressing any angina or significant dyspnea exertion. He does have some mild chest wall soreness but day by day this seems to be getting better. He denies PND, orthopnea, dizziness, syncope, edema, or early satiety.  Home Medications  Prior to Admission medications   Medication Sig Start Date End Date Taking?  Authorizing Provider  ACCU-CHEK AVIVA PLUS test strip  08/21/13  Yes Historical Provider, MD  amLODipine (NORVASC) 10 MG tablet Take 1 tablet (10 mg total) by mouth daily. 05/26/14  Yes Coolidge Breeze, PA-C  aspirin EC 325 MG EC tablet Take 1 tablet (325 mg total) by mouth daily. 05/26/14  Yes Gina L Collins, PA-C  glimepiride (AMARYL) 4 MG tablet Take 4 mg by mouth 2 (two) times daily.   Yes Historical Provider, MD  JANUVIA 100 MG tablet Take 100 mg by mouth daily.  10/15/13  Yes Historical Provider, MD  Lancets Misc. (ACCU-CHEK SOFTCLIX LANCET DEV) KIT  08/21/13  Yes Historical Provider, MD  lisinopril (PRINIVIL) 20 MG tablet Take 1 tablet (20 mg total) by mouth daily. Patient taking differently: Take 20 mg by mouth daily. TAKING 1 IN THE AM 02/12/13  Yes Luke K Kilroy, PA-C  metFORMIN (GLUCOPHAGE) 1000 MG tablet Take 1,000 mg by mouth 2 (two) times daily with a meal.   Yes Historical Provider, MD  metoprolol tartrate (LOPRESSOR) 25 MG tablet Take 0.5 tablets (12.5 mg total) by mouth 2 (two) times daily. 06/21/14  Yes Erin Barrett, PA-C  Multiple Vitamins-Minerals (CENTRUM SILVER ADULT 50+ PO) Take by mouth.   Yes Historical Provider, MD  nitroGLYCERIN (NITROSTAT) 0.4 MG SL tablet Place 1 tablet (0.4 mg total) under the tongue every 5 (five) minutes as needed for chest pain. 02/12/13  Yes Luke K Kilroy, PA-C  simvastatin (ZOCOR) 40 MG tablet Take 1 tablet (40 mg total) by mouth every evening. 04/13/13  Yes Lorretta Harp, MD  triamterene-hydrochlorothiazide (MAXZIDE-25) 37.5-25 MG per tablet Take 37.5 tablets by mouth as needed. Fluid build 04/20/14  Yes Historical Provider, MD  vitamin B-12 (CYANOCOBALAMIN) 100 MCG tablet Take 50 mcg by mouth daily.   Yes Historical Provider, MD    Review of Systems  As above, patient is doing remarkably well following his bypass surgery in mid December.  He denies chest pain, palpitations, dyspnea, pnd, orthopnea, n, v, dizziness, syncope, edema, weight gain, or  early satiety.  All other systems reviewed and are otherwise negative except as noted above.  Physical Exam  Blood pressure 114/58, pulse 67, height _0  (1.778 m), weight 169 lb 12.8 oz (77.021 kg).  General: Pleasant, NAD Psych: Normal affect. Neuro: Alert and oriented X 3. Moves all extremities spontaneously. HEENT: Normal  Neck: Supple without bruits or JVD. Lungs:  Resp regular and unlabored, diminished breath sounds in the left base otherwise clear to auscultation. Heart: RRR no s3, s4, or murmurs. Chest wall: Well-healed midline surgical incision. Abdomen: Soft, non-tender, non-distended, BS + x 4.  Extremities: No clubbing, cyanosis or edema. DP/PT/Radials 2+ and equal bilaterally.  Accessory Clinical Findings  ECG - regular sinus rhythm, T-wave inversion V4 through V6. No acute ST or T changes.  Assessment & Plan  1.  Coronary artery disease status post coronary artery bypass grafting: Patient underwent CABG 4 in December and has been doing well since. He has been walking daily and also using his local gym where he has a  silver sneakers membership. He has not been having any chest pain or dyspnea. He does not think that he will partake in cardiac rehabilitation because he has been a lifelong exerciser and would have to pay 120 hours a month for rehabilitation. He remains on aspirin, statin, beta blocker, and ACE inhibitor therapy and is tolerating all well.  2. Hypertension: Stable on beta blocker, ACE inhibitor, and calcium channel blocker therapy.  3. Hyperlipidemia: He is on simvastatin therapy long-term. This is followed closely by his primary care provider.  4. Type 2 diabetes mellitus: He is on Januvia and metformin and this is also followed by his primary care provider. Next  5. Stage III chronic kidney disease: Renal function was stable during his hospital position. He is on ACE inhibitor therapy.  6. Disposition: Follow-up with Dr. Burt Knack in 3 months or sooner if  necessary.   Murray Hodgkins, NP  06/29/2014, 10:53 AM

## 2014-07-12 ENCOUNTER — Telehealth: Payer: Self-pay | Admitting: Cardiovascular Disease

## 2014-07-12 NOTE — Telephone Encounter (Signed)
New problem    Pt want to be referred to chiropractor or Physical therapy b/c of his neck, arm hand pain that he has had since surgery. Please call pt.

## 2014-07-12 NOTE — Telephone Encounter (Signed)
I spoke with Justin Salinas the pt's friend.  She is currently with the pt and he is driving.  The pt is HOH and Justin Salinas is who called the office on behalf of the pt. Since the pt's CABG the pt has had pain in the base of his neck that travels down his left arm and causes numbness in his left hand. This causes the pt to have difficulty writing.  Due to the pt's insurance he needs to obtain a referral from his PCP. I advised Justin Salinas to touch base with Dr Delman Kitten office for referral and further evaluation if needed.

## 2014-07-13 ENCOUNTER — Telehealth: Payer: Self-pay | Admitting: Cardiovascular Disease

## 2014-07-13 NOTE — Telephone Encounter (Signed)
°  1. Are you calling in reference to your FMLA or disability form? No  2. What is your question in regards to FMLA or disability form? No   3. Do you need copies of your medical records? Yes  4. Are you waiting on a nurse to call you back with results or are you wanting copies of your results? No   5. Comments: Requesting documentation that states that Dr. Burt Knack or the PA has referred this pt to see a chiropractor. Please call back. They would like to complete the order but will need this referral from the Cardiologist.

## 2014-07-13 NOTE — Telephone Encounter (Signed)
Ok to refer to chiropractor. thx

## 2014-07-15 NOTE — Telephone Encounter (Signed)
I spoke with Justin Salinas and I will fax records to [phone].

## 2014-07-19 DIAGNOSIS — E1142 Type 2 diabetes mellitus with diabetic polyneuropathy: Secondary | ICD-10-CM | POA: Diagnosis not present

## 2014-07-19 DIAGNOSIS — R600 Localized edema: Secondary | ICD-10-CM | POA: Diagnosis not present

## 2014-07-19 DIAGNOSIS — I129 Hypertensive chronic kidney disease with stage 1 through stage 4 chronic kidney disease, or unspecified chronic kidney disease: Secondary | ICD-10-CM | POA: Diagnosis not present

## 2014-07-19 DIAGNOSIS — M5412 Radiculopathy, cervical region: Secondary | ICD-10-CM | POA: Diagnosis not present

## 2014-07-19 DIAGNOSIS — N183 Chronic kidney disease, stage 3 (moderate): Secondary | ICD-10-CM | POA: Diagnosis not present

## 2014-07-26 DIAGNOSIS — M542 Cervicalgia: Secondary | ICD-10-CM | POA: Diagnosis not present

## 2014-07-26 DIAGNOSIS — M791 Myalgia: Secondary | ICD-10-CM | POA: Diagnosis not present

## 2014-08-03 ENCOUNTER — Encounter: Payer: Self-pay | Admitting: Thoracic Surgery (Cardiothoracic Vascular Surgery)

## 2014-08-03 ENCOUNTER — Ambulatory Visit (INDEPENDENT_AMBULATORY_CARE_PROVIDER_SITE_OTHER): Payer: Self-pay | Admitting: Thoracic Surgery (Cardiothoracic Vascular Surgery)

## 2014-08-03 VITALS — BP 130/70 | HR 80 | Ht 70.0 in | Wt 169.0 lb

## 2014-08-03 DIAGNOSIS — Z951 Presence of aortocoronary bypass graft: Secondary | ICD-10-CM

## 2014-08-03 DIAGNOSIS — I2510 Atherosclerotic heart disease of native coronary artery without angina pectoris: Secondary | ICD-10-CM

## 2014-08-03 NOTE — Progress Notes (Addendum)
HPI:  Mr. Kohn returns today for a postoperative follow-up visit.  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 He was seen in the office in mid January. At that time he was making good progress.  Since his last visit he was having difficulty with neck pain and numbness in the fourth and fifth fingers of his left hand. He saw a Restaurant manager, fast food. He was told that he had a disc problem. They recommended 12 chiropractic sessions. He's previously seen Dr. Granville Lewis and had back surgery years ago. He is requesting to be referred back to Dr. Annette Stable to get a second opinion.  He has minimal incisional discomfort. He is not taking anything for that. He's not had any angina. He is exercising on a regular basis and tolerating that well. He's been working in his yard.  Past Medical History  Diagnosis Date  . Allergy   . Diabetes     AODM  . Asthma   . Hyperlipidemia   . Essential hypertension   . CAD (coronary artery disease)     a. s/p MI 09/1993;  b. known CTO of RCA;  c. 06/2014 Cath: LM short/mod dzs, LAD 95ost, LCX nl, RI nl, RCA 100, EF 55%;  c. 06/2014 s/p CABG x 4: LIMA->LAD, VG->D1, VG->OM1, VG->Acute Marginal.  . BPH (benign prostatic hyperplasia)   . GERD (gastroesophageal reflux disease)   . Diastolic dysfunction     a. 05/2015 Echo: EF 60-65%, LVH, Gr 1 DD, mild MR, mod dil RA/LA, mod TR, PASP 9mmHg.  . CKD (chronic kidney disease), stage III       Current Outpatient Prescriptions  Medication Sig Dispense Refill  . ACCU-CHEK AVIVA PLUS test strip     . aspirin EC 325 MG EC tablet Take 1 tablet (325 mg total) by mouth daily. 30 tablet 0  . furosemide (LASIX) 40 MG tablet Take 40 mg by mouth daily.   1  . glimepiride (AMARYL) 4 MG tablet Take 4 mg by mouth 2 (two) times daily.    Marland Kitchen ipratropium (ATROVENT) 0.06 % nasal spray     . JANUVIA 100 MG tablet Take 100 mg by mouth daily.     .  Lancets Misc. (ACCU-CHEK SOFTCLIX LANCET DEV) KIT     . lisinopril (PRINIVIL) 20 MG tablet Take 1 tablet (20 mg total) by mouth daily. (Patient taking differently: Take 20 mg by mouth daily. TAKING 1 IN THE AM)    . Multiple Vitamins-Minerals (CENTRUM SILVER ADULT 50+ PO) Take by mouth.    . nitroGLYCERIN (NITROSTAT) 0.4 MG SL tablet Place 1 tablet (0.4 mg total) under the tongue every 5 (five) minutes as needed for chest pain. 25 tablet 2  . simvastatin (ZOCOR) 40 MG tablet Take 1 tablet (40 mg total) by mouth every evening. 30 tablet 6  . vitamin B-12 (CYANOCOBALAMIN) 100 MCG tablet Take 50 mcg by mouth daily.     No current facility-administered medications for this visit.    Physical Exam BP 130/70 mmHg  Pulse 80  Ht $R'5\' 10"'sT$  (1.778 m)  Wt 169 lb (76.658 kg)  BMI 24.25 kg/m2  SpO76 35% 74 year old man in no acute distress Alert and oriented 3 with no motor deficits. Decreased sensation fourth and fifth fingers left hand. Sternal incision well-healed, sternum stable Cardiac regular rate and rhythm normal S1 and S2 Lungs clear with equal breath is bilaterally No peripheral edema  Diagnostic Tests: Chest x-ray from 06/21/2014 showed postoperative changes, no pathology No new studies today.   Impression: 74 year old gentleman is now 2 and half months out from coronary bypass grafting 74 for left main and three-vessel disease. He is doing well at this time. He is having minimal discomfort and his exercise tolerance is good.  There are no restrictions on his activities at this time   His major complaint is neck pain with some numbness in the fourth and fifth fingers of his left hand. He's had x-rays done by a chiropractor, which showed a disc problem. He is requesting referral to Dr. Colon Flattery for a second opinion as to management. Dr. Annette Stable is done back surgery on him in the past. We will call his office and see if we can arrange appointment.  Plan: He will follow-up with Dr.  [name] for his cardiology care.  I will be happy to see him back any time if I can be of any further assistance with his care in the future  Norelle Runnion C. Roxan Hockey, MD [facility] [phone]

## 2014-08-25 DIAGNOSIS — M5412 Radiculopathy, cervical region: Secondary | ICD-10-CM | POA: Diagnosis not present

## 2014-09-01 DIAGNOSIS — J984 Other disorders of lung: Secondary | ICD-10-CM | POA: Diagnosis not present

## 2014-09-01 DIAGNOSIS — J309 Allergic rhinitis, unspecified: Secondary | ICD-10-CM | POA: Diagnosis not present

## 2014-09-01 DIAGNOSIS — R0602 Shortness of breath: Secondary | ICD-10-CM | POA: Diagnosis not present

## 2014-09-08 DIAGNOSIS — M47812 Spondylosis without myelopathy or radiculopathy, cervical region: Secondary | ICD-10-CM | POA: Diagnosis not present

## 2014-09-08 DIAGNOSIS — M9971 Connective tissue and disc stenosis of intervertebral foramina of cervical region: Secondary | ICD-10-CM | POA: Diagnosis not present

## 2014-09-08 DIAGNOSIS — M4802 Spinal stenosis, cervical region: Secondary | ICD-10-CM | POA: Diagnosis not present

## 2014-09-08 DIAGNOSIS — M5412 Radiculopathy, cervical region: Secondary | ICD-10-CM | POA: Diagnosis not present

## 2014-10-04 ENCOUNTER — Encounter: Payer: Self-pay | Admitting: Cardiovascular Disease

## 2014-10-04 ENCOUNTER — Ambulatory Visit (INDEPENDENT_AMBULATORY_CARE_PROVIDER_SITE_OTHER): Payer: Commercial Managed Care - HMO | Admitting: Cardiovascular Disease

## 2014-10-04 VITALS — BP 128/60 | HR 58 | Ht 70.0 in

## 2014-10-04 DIAGNOSIS — I2510 Atherosclerotic heart disease of native coronary artery without angina pectoris: Secondary | ICD-10-CM

## 2014-10-04 MED ORDER — VALSARTAN 80 MG PO TABS: 80.0000 mg | ORAL_TABLET | Freq: Every day | ORAL | Status: DC

## 2014-10-04 MED ORDER — TORSEMIDE 20 MG PO TABS: 20.0000 mg | ORAL_TABLET | Freq: Every day | ORAL | Status: DC

## 2014-10-04 NOTE — Progress Notes (Signed)
Cardiology Office Note   Date:  10/09/2014   ID:  Justin Salinas, DOB [birth year], MRN [id number]  PCP:  Abigail Miyamoto, MD  Cardiologist:  [name], MD    Chief Complaint  Patient presents with  . Coronary Artery Disease     History of Present Illness: Justin Salinas is a 74 y.o. male who presents for follow-up of CAD. He underwent multivessel CABG in December after presenting with progressive symptoms of angina and shortness of breath. He had undergone PCI in the 1990's but had done well until his presentation last year. He does admit to chronic dyspnea and this has been worse of late - he attributes to seasonal allergies. Otherwise doing well without chest pain, orthopnea, lightheadedness, or PND. He does complain of leg swelling.   Past Medical History  Diagnosis Date  . Allergy   . Diabetes     AODM  . Asthma   . Hyperlipidemia   . Essential hypertension   . CAD (coronary artery disease)     a. s/p MI 09/1993;  b. known CTO of RCA;  c. 06/2014 Cath: LM short/mod dzs, LAD 95ost, LCX nl, RI nl, RCA 100, EF 55%;  c. 06/2014 s/p CABG x 4: LIMA->LAD, VG->D1, VG->OM1, VG->Acute Marginal.  . BPH (benign prostatic hyperplasia)   . GERD (gastroesophageal reflux disease)   . Diastolic dysfunction     a. 05/2015 Echo: EF 60-65%, LVH, Gr 1 DD, mild MR, mod dil RA/LA, mod TR, PASP 48mHg.  . CKD (chronic kidney disease), stage III     Past Surgical History  Procedure Laterality Date  . Back surgery  2000    back fusion - Dr. HEarnie Larsson . Vasectomy  1963  . Angioplasty  09/22/1993    POBA of RCA (Dr. RMarella Chimes  . Transthoracic echocardiogram  10/20/2012    EF 55-60%, moderate concentric hypertrophy, ventricular septum thickness increased, calcified MV annulus  . Nm myocar perf wall motion  09/19/2009    bruce myoview - mild perfusion defect in basal inferior region (infarct/scar), EF 60%, low risk scan  . Cardiac catheterization  11/28/1998    "silent occlusion" of  RCA w/collaterals from left coronary system, prox LAD w/40-50% eccentric narrowing, 1st diagonal with 70-80% eccentric narrowing, 85% narrowing of prox small OM1 (Dr. RMarella Chimes  . Renal doppler  10/2011    SMA w/ 70-99% diameter reduction & high grade stenosis; R&L renals w/narrowing and increased velocities (60-99%), R kidney smaller than L  . Carotid doppler  07/2011    left subclavian (50-69%); right bulb (0-49%); RICA (normal); left mid-distal CCA (0-49%); left bulb/prox ICA (50-69%); left vertebral with abnormal antegrade flow  . Nasal sinus surgery  2010  . Left heart catheterization with coronary angiogram N/A 05/19/2014    Procedure: LEFT HEART CATHETERIZATION WITH CORONARY ANGIOGRAM;  Surgeon: MBlane Ohara MD;  [facility];  Service: Cardiovascular;  Laterality: N/A;  . Coronary artery bypass graft N/A 05/21/2014    Procedure: CORONARY ARTERY BYPASS GRAFTING (CABG) x  four, using left internal mammary artery and right leg greater saphenous vein harvested endoscopically;  Surgeon: SMelrose Nakayama MD;  [facility]  Service: Open Heart Surgery;  Laterality: N/A;  . Intraoperative transesophageal echocardiogram N/A 05/21/2014    Procedure: INTRAOPERATIVE TRANSESOPHAGEAL ECHOCARDIOGRAM;  Surgeon: SMelrose Nakayama MD;  [place] [facility]  Service: Open Heart Surgery;  Laterality: N/A;    Current Outpatient Prescriptions  Medication Sig Dispense Refill  .  ACCU-CHEK AVIVA PLUS test strip     . aspirin EC 325 MG EC tablet Take 1 tablet (325 mg total) by mouth daily. 30 tablet 0  . glimepiride (AMARYL) 4 MG tablet Take 4 mg by mouth 2 (two) times daily.    Marland Kitchen ipratropium (ATROVENT) 0.06 % nasal spray     . JANUVIA 100 MG tablet Take 100 mg by mouth daily.     . Lancets Misc. (ACCU-CHEK SOFTCLIX LANCET DEV) KIT     . metFORMIN (GLUCOPHAGE) 1000 MG tablet Take 1,000 mg by mouth 2 (two) times daily with a meal.     . Multiple Vitamins-Minerals (CENTRUM SILVER ADULT  50+ PO) Take by mouth.    . nitroGLYCERIN (NITROSTAT) 0.4 MG SL tablet Place 1 tablet (0.4 mg total) under the tongue every 5 (five) minutes as needed for chest pain. 25 tablet 2  . simvastatin (ZOCOR) 40 MG tablet Take 1 tablet (40 mg total) by mouth every evening. 30 tablet 6  . vitamin B-12 (CYANOCOBALAMIN) 100 MCG tablet Take 50 mcg by mouth daily.    Marland Kitchen torsemide (DEMADEX) 20 MG tablet Take 1 tablet (20 mg total) by mouth daily. 30 tablet 11  . valsartan (DIOVAN) 80 MG tablet Take 1 tablet (80 mg total) by mouth daily. 30 tablet 11   No current facility-administered medications for this visit.    Allergies:   Lasix   Social History:  The patient  reports that he quit smoking about 42 years ago. His smoking use included Cigarettes. His smokeless tobacco use includes Snuff. He reports that he drinks about 1.8 oz of alcohol per week. He reports that he does not use illicit drugs.   Family History:  The patient's  family history includes Colon polyps in his brother; Diabetes in his sister; Heart disease in his brother; Stroke in his father; Uterine cancer in his mother. There is no history of Colon cancer.    ROS:  Please see the history of present illness.  Otherwise, review of systems is positive for leg swelling, hearing loss, cough, DOE, headaches, wheezing.  All other systems are reviewed and negative.    PHYSICAL EXAM: VS:  BP 128/60 mmHg  Pulse 58  Ht _0  (1.778 m) , BMI There is no weight on file to calculate BMI. GEN: Well nourished, well developed, in no acute distress HEENT: normal Neck: no JVD, no masses. No carotid bruits Cardiac: RRR without murmur or gallop                Respiratory:  clear to auscultation bilaterally, normal work of breathing GI: soft, nontender, nondistended, + BS MS: no deformity or atrophy Ext: trace pretibial edema, pedal pulses 2+= bilaterally Skin: warm and dry, no rash Neuro:  Strength and sensation are intact Psych: euthymic mood, full  affect  EKG:  EKG is not ordered today.  Recent Labs: 05/21/2014: ALT 14 05/22/2014: Magnesium 2.4 05/24/2014: Hemoglobin 10.1*; Platelets 124* 05/26/2014: BUN 30*; Creatinine 1.56*; Potassium 3.9; Sodium 134*   Lipid Panel  No results found for: CHOL, TRIG, HDL, CHOLHDL, VLDL, LDLCALC, LDLDIRECT    Wt Readings from Last 3 Encounters:  08/03/14 169 lb (76.658 kg)  06/29/14 169 lb 12.8 oz (77.021 kg)  06/21/14 171 lb (77.565 kg)     Cardiac Studies Reviewed: Cardiac Catheterization 05/19/2014: Final Conclusions:  1. Severe ostial LAD stenosis 2. Total occlusion of the RCA 3. Moderate left main stenosis 4. Patent left circumflex 5. Preserved LV systolic function  Recommendations:  Hospital admission, TCTS consult for CABG, start IV heparin, high-risk coronary anatomy.    ASSESSMENT AND PLAN: 1.  CAD, native vessel: stable without angina following CABG. See med changes below. Continue ASA/statin. Follow-up APP 6 months.  2. HTN: with upper airway symptoms, change lisinopril to diovan 80 mg.   3. Edema: Trial of torsemide. Stop HCTZ.  4. Hyperlipidemia: continue statin. Lifestyle modification reviewed with patient. Followed by PCP.   Current medicines are reviewed with the patient today.  The patient does not have concerns regarding medicines.  Labs/ tests ordered today include:   Orders Placed This Encounter  Procedures  . Basic Metabolic Panel (BMET)    Disposition:   FU APP 6 months  Signed, [name], MD  10/09/2014 12:17 AM    [facility] [address], [place], [place]  [address] Phone: [phone]; Fax: [phone]

## 2014-10-04 NOTE — Patient Instructions (Signed)
Medication Instructions:  Your physician has recommended you make the following change in your medication:  1. STOP Lisinopril 2. STOP Hydrochlorothiazide 3. START Diovan 80mg  take one by mouth daily 4. START Torsemide 20mg  take one by mouth daily  Labwork: Your physician recommends that you return for lab work in: 2 WEEKS (BMP)  Testing/Procedures: No new orders.  Follow-Up: Your physician wants you to follow-up in: 6 MONTHS with PA/NP. You will receive a reminder letter in the mail two months in advance. If you don't receive a letter, please call our office to schedule the follow-up appointment.  Your physician wants you to follow-up in: 1 YEAR with Dr Burt Knack. You will receive a reminder letter in the mail two months in advance. If you don't receive a letter, please call our office to schedule the follow-up appointment.   Any Other Special Instructions Will Be Listed Below (If Applicable).

## 2014-10-09 ENCOUNTER — Encounter: Payer: Self-pay | Admitting: Cardiovascular Disease

## 2014-10-18 ENCOUNTER — Other Ambulatory Visit (INDEPENDENT_AMBULATORY_CARE_PROVIDER_SITE_OTHER): Payer: Commercial Managed Care - HMO | Admitting: *Deleted

## 2014-10-18 DIAGNOSIS — I2510 Atherosclerotic heart disease of native coronary artery without angina pectoris: Secondary | ICD-10-CM | POA: Diagnosis not present

## 2014-10-18 LAB — BASIC METABOLIC PANEL
BUN: 20 mg/dL (ref 6–23)
CO2: 30 mEq/L (ref 19–32)
Calcium: 9.6 mg/dL (ref 8.4–10.5)
Chloride: 102 mEq/L (ref 96–112)
Creatinine, Ser: 1.36 mg/dL (ref 0.40–1.50)
GFR: 54.12 mL/min — ABNORMAL LOW (ref >60.00)
Glucose, Bld: 195 mg/dL — ABNORMAL HIGH (ref 70–99)
Potassium: 4.8 mEq/L (ref 3.5–5.1)
Sodium: 135 mEq/L (ref 135–145)

## 2014-10-28 DIAGNOSIS — E1142 Type 2 diabetes mellitus with diabetic polyneuropathy: Secondary | ICD-10-CM | POA: Diagnosis not present

## 2014-12-10 ENCOUNTER — Other Ambulatory Visit: Payer: Self-pay | Admitting: *Deleted

## 2014-12-10 DIAGNOSIS — I2510 Atherosclerotic heart disease of native coronary artery without angina pectoris: Secondary | ICD-10-CM

## 2014-12-10 MED ORDER — VALSARTAN 80 MG PO TABS: 80.0000 mg | ORAL_TABLET | Freq: Every day | ORAL | Status: DC

## 2014-12-10 MED ORDER — TORSEMIDE 20 MG PO TABS: 20.0000 mg | ORAL_TABLET | Freq: Every day | ORAL | Status: DC

## 2015-01-18 DIAGNOSIS — E119 Type 2 diabetes mellitus without complications: Secondary | ICD-10-CM | POA: Diagnosis not present

## 2015-01-18 DIAGNOSIS — H2513 Age-related nuclear cataract, bilateral: Secondary | ICD-10-CM | POA: Diagnosis not present

## 2015-02-28 DIAGNOSIS — I2510 Atherosclerotic heart disease of native coronary artery without angina pectoris: Secondary | ICD-10-CM | POA: Diagnosis not present

## 2015-02-28 DIAGNOSIS — N183 Chronic kidney disease, stage 3 (moderate): Secondary | ICD-10-CM | POA: Diagnosis not present

## 2015-02-28 DIAGNOSIS — Z23 Encounter for immunization: Secondary | ICD-10-CM | POA: Diagnosis not present

## 2015-02-28 DIAGNOSIS — I700 Atherosclerosis of aorta: Secondary | ICD-10-CM | POA: Diagnosis not present

## 2015-02-28 DIAGNOSIS — I129 Hypertensive chronic kidney disease with stage 1 through stage 4 chronic kidney disease, or unspecified chronic kidney disease: Secondary | ICD-10-CM | POA: Diagnosis not present

## 2015-02-28 DIAGNOSIS — E1142 Type 2 diabetes mellitus with diabetic polyneuropathy: Secondary | ICD-10-CM | POA: Diagnosis not present

## 2015-03-28 DIAGNOSIS — H5203 Hypermetropia, bilateral: Secondary | ICD-10-CM | POA: Diagnosis not present

## 2015-03-28 DIAGNOSIS — H52203 Unspecified astigmatism, bilateral: Secondary | ICD-10-CM | POA: Diagnosis not present

## 2015-04-04 NOTE — Progress Notes (Signed)
 Cardiology Office Note   Date:  04/05/2015   ID:  Leavy D Hatton, DOB 06/11/1940, MRN [id number]  PCP:  FRIED, ROBERT L, MD  Cardiologist:  Dr. Michael Cooper   Electrophysiologist:  n/a  Chief Complaint  Patient presents with  . Follow-up  . Coronary Artery Disease     History of Present Illness: Justin Salinas is a 74 y.o. male with a hx of CAD status post prior PCI in the 1990s and subsequent multivessel CABG in 12/15, HTN, HL, diabetes, CKD. Last seen by Dr. Cooper 5/16. HCTZ was stopped and he was placed on torsemide given LE edema. ACE inhibitor was changed ARB secondary to cough. He returns for follow-up.  The patient denies chest pain, shortness of breath, syncope, orthopnea, PND or significant pedal edema.    Studies/Reports Reviewed Today:  Echo 12/15 Moderate LVH, EF 60-65%, normal wall motion, grade 1 diastolic dysfunction, aortic sclerosis without stenosis, MAC, mild MR, moderate BAE, moderate TR, PASP 40 mmHg  LHC 12/15 LM:  50% proximal  LAD:  Ostial/proximal 95%, with heavy calcification. D1 75% proximal   LCx:  AV circumflex has mild irregularity and supplies 2 OM branches.  RCA: Severe calcification. Total occlusion at the ostium.  left-to-right collaterals.  LVEF of 55% Final Conclusions:  1. Severe ostial LAD stenosis 2. Total occlusion of the RCA 3. Moderate left main stenosis 4. Patent left circumflex 5. Preserved LV systolic function Recommendations: Hospital admission, TCTS consult for CABG  Renal Art US 5/14 SMA 70-99% R and L RA 60-99%  Carotid US 5/14 L Subclavian 50-69% R ICA 0-49%; L ICA 50-69% >> repeat 1 year  Myoview 4/11 Inferior scar, no ischemia, EF 60%   Past Medical History  Diagnosis Date  . Allergy   . Diabetes (HCC)     AODM  . Asthma   . Hyperlipidemia   . Essential hypertension   . CAD (coronary artery disease)     a. s/p MI 09/1993;  b. known CTO of RCA;  c. 06/2014 Cath: LM short/mod dzs, LAD 95ost, LCX  nl, RI nl, RCA 100, EF 55%;  c. 06/2014 s/p CABG x 4: LIMA->LAD, VG->D1, VG->OM1, VG->Acute Marginal.  . BPH (benign prostatic hyperplasia)   . GERD (gastroesophageal reflux disease)   . Diastolic dysfunction     a. 05/2015 Echo: EF 60-65%, LVH, Gr 1 DD, mild MR, mod dil RA/LA, mod TR, PASP 40mmHg.  . CKD (chronic kidney disease), stage III     Past Surgical History  Procedure Laterality Date  . Back surgery  2000    back fusion - Dr. Henry Pool  . Vasectomy  1963  . Angioplasty  09/22/1993    POBA of RCA (Dr. R. Weintraub)  . Transthoracic echocardiogram  10/20/2012    EF 55-60%, moderate concentric hypertrophy, ventricular septum thickness increased, calcified MV annulus  . Nm myocar perf wall motion  09/19/2009    bruce myoview - mild perfusion defect in basal inferior region (infarct/scar), EF 60%, low risk scan  . Cardiac catheterization  11/28/1998    "silent occlusion" of RCA w/collaterals from left coronary system, prox LAD w/40-50% eccentric narrowing, 1st diagonal with 70-80% eccentric narrowing, 85% narrowing of prox small OM1 (Dr. R. Weintraub)  . Renal doppler  10/2011    SMA w/ 70-99% diameter reduction & high grade stenosis; R&L renals w/narrowing and increased velocities (60-99%), R kidney smaller than L  . Carotid doppler  07/2011    left subclavian (50-69%); right bulb (  0-49%); RICA (normal); left mid-distal CCA (0-49%); left bulb/prox ICA (50-69%); left vertebral with abnormal antegrade flow  . Nasal sinus surgery  2010  . Left heart catheterization with coronary angiogram N/A 05/19/2014    Procedure: LEFT HEART CATHETERIZATION WITH CORONARY ANGIOGRAM;  Surgeon: Blane Ohara, MD;  Location: [facility] CATH LAB;  Service: Cardiovascular;  Laterality: N/A;  . Coronary artery bypass graft N/A 05/21/2014    Procedure: CORONARY ARTERY BYPASS GRAFTING (CABG) x  four, using left internal mammary artery and right leg greater saphenous vein harvested endoscopically;  Surgeon: Melrose Nakayama, MD;  Location: [place];  Service: Open Heart Surgery;  Laterality: N/A;  . Intraoperative transesophageal echocardiogram N/A 05/21/2014    Procedure: INTRAOPERATIVE TRANSESOPHAGEAL ECHOCARDIOGRAM;  Surgeon: Melrose Nakayama, MD;  Location: [place];  Service: Open Heart Surgery;  Laterality: N/A;     Current Outpatient Prescriptions  Medication Sig Dispense Refill  . acarbose (PRECOSE) 50 MG tablet Take 50 mg by mouth 3 (three) times daily with meals.     Marland Kitchen ACCU-CHEK AVIVA PLUS test strip     . aspirin 81 MG tablet Take 81 mg by mouth daily.    Marland Kitchen glimepiride (AMARYL) 4 MG tablet Take 4 mg by mouth 2 (two) times daily.    . Lancets Misc. (ACCU-CHEK SOFTCLIX LANCET DEV) KIT     . metFORMIN (GLUCOPHAGE) 1000 MG tablet Take 1,000 mg by mouth 2 (two) times daily with a meal.     . Multiple Vitamins-Minerals (CENTRUM SILVER ADULT 50+ PO) Take by mouth.    . nitroGLYCERIN (NITROSTAT) 0.4 MG SL tablet Place 1 tablet (0.4 mg total) under the tongue every 5 (five) minutes as needed for chest pain. 25 tablet 2  . simvastatin (ZOCOR) 40 MG tablet Take 1 tablet (40 mg total) by mouth every evening. 30 tablet 6  . torsemide (DEMADEX) 20 MG tablet Take 1 tablet (20 mg total) by mouth daily. 90 tablet 2  . valsartan (DIOVAN) 80 MG tablet Take 1 tablet (80 mg total) by mouth daily. 90 tablet 2  . vitamin B-12 (CYANOCOBALAMIN) 100 MCG tablet Take 50 mcg by mouth daily.     No current facility-administered medications for this visit.    Allergies:   Lasix    Social History:   Social History   Social History  . Marital Status: Married    Spouse Name: N/A  . Number of Children: 2  . Years of Education: N/A   Social History Main Topics  . Smoking status: Former Smoker    Types: Cigarettes    Quit date: 06/04/1972  . Smokeless tobacco: Current User    Types: Snuff     Comment: quit about 40 years  . Alcohol Use: 1.8 oz/week    3 Standard drinks or equivalent per week  . Drug Use: No   . Sexual Activity: Not Asked   Other Topics Concern  . None   Social History Narrative     Family History:   Family History  Problem Relation Age of Onset  . Colon cancer Neg Hx   . Uterine cancer Mother   . Diabetes Sister   . Colon polyps Brother   . Heart disease Brother   . Stroke Father       ROS:   Please see the history of present illness.   Review of Systems  HENT: Positive for hearing loss.   All other systems reviewed and are negative.     PHYSICAL EXAM: VS:  BP 132/50 mmHg  Pulse 53  Ht 5' 10" (1.778 m)  Wt 169 lb 12.8 oz (77.021 kg)  BMI 24.36 kg/m2    Wt Readings from Last 3 Encounters:  04/05/15 169 lb 12.8 oz (77.021 kg)  08/03/14 169 lb (76.658 kg)  06/29/14 169 lb 12.8 oz (77.021 kg)     GEN: Well nourished, well developed, in no acute distress HEENT: normal Neck: no JVD,  no masses; + R carotid bruit Cardiac:  Normal S1/S2, RRR; 4-4/0 systolic murmur RUSB,  no rubs or gallops, no edema   Respiratory:  clear to auscultation bilaterally, no wheezing, rhonchi or rales. GI: soft, nontender, nondistended, + BS MS: no deformity or atrophy Skin: warm and dry  Neuro:  CNs II-XII intact, Strength and sensation are intact Psych: Normal affect   EKG:  EKG is ordered today.  It demonstrates:   Sinus bradycardia, HR 53, normal axis, nonspecific ST-T wave changes, QTc 397 ms   Recent Labs: 05/21/2014: ALT 14 05/22/2014: Magnesium 2.4 05/24/2014: Hemoglobin 10.1*; Platelets 124* 10/18/2014: BUN 20; Creatinine, Ser 1.36; Potassium 4.8; Sodium 135    Lipid Panel No results found for: CHOL, TRIG, HDL, CHOLHDL, VLDL, LDLCALC, LDLDIRECT    ASSESSMENT AND PLAN:  1. CAD: Status post prior PCI in the 1990s and subsequent CABG in 12/15.  Doing well without angina. Continue ASA, statin, angiotensin receptor blocker.  2. HTN:  Controlled.   3. Hyperlipidemia:  Followed by PCP.  Will request recent albs.   4. LE Edema:  Resolved on Torsemide.   Continue current Rx.  5. CKD:  Stable creatinine.  Request recent labs from PCP.   6. Carotid Stenosis:  Past due for dopplers.  Arrange Carotid US.   Medication Changes: Current medicines are reviewed at length with the patient today.  Concerns regarding medicines are as outlined above.  The following changes have been made:   Discontinued Medications   ASPIRIN EC 325 MG EC TABLET    Take 1 tablet (325 mg total) by mouth daily.   IPRATROPIUM (ATROVENT) 0.06 % NASAL SPRAY       JANUVIA 100 MG TABLET    Take 100 mg by mouth daily.    Modified Medications   No medications on file   New Prescriptions   No medications on file   Labs/ tests ordered today include:   Orders Placed This Encounter  Procedures  . EKG 12-Lead      Disposition:    FU with Dr. [name] 6 mos.     Signed, Versie Starks, MHS 04/05/2015 2:47 PM    [address], [place], [place]  [address] Phone: [phone]; Fax: [phone]

## 2015-04-05 ENCOUNTER — Ambulatory Visit (INDEPENDENT_AMBULATORY_CARE_PROVIDER_SITE_OTHER): Payer: Commercial Managed Care - HMO | Admitting: Physician Assistant

## 2015-04-05 ENCOUNTER — Encounter: Payer: Self-pay | Admitting: Physician Assistant

## 2015-04-05 VITALS — BP 132/50 | HR 53 | Ht 70.0 in | Wt 169.8 lb

## 2015-04-05 DIAGNOSIS — I10 Essential (primary) hypertension: Secondary | ICD-10-CM

## 2015-04-05 DIAGNOSIS — E785 Hyperlipidemia, unspecified: Secondary | ICD-10-CM

## 2015-04-05 DIAGNOSIS — N183 Chronic kidney disease, stage 3 (moderate): Secondary | ICD-10-CM

## 2015-04-05 DIAGNOSIS — R600 Localized edema: Secondary | ICD-10-CM

## 2015-04-05 DIAGNOSIS — I2510 Atherosclerotic heart disease of native coronary artery without angina pectoris: Secondary | ICD-10-CM | POA: Diagnosis not present

## 2015-04-05 DIAGNOSIS — I6523 Occlusion and stenosis of bilateral carotid arteries: Secondary | ICD-10-CM

## 2015-04-05 NOTE — Patient Instructions (Signed)
Medication Instructions:  Your physician recommends that you continue on your current medications as directed. Please refer to the Current Medication list given to you today.   Labwork: NONE  Testing/Procedures: Your physician has requested that you have a carotid duplex. This test is an ultrasound of the carotid arteries in your neck. It looks at blood flow through these arteries that supply the brain with blood. Allow one hour for this exam. There are no restrictions or special instructions.  Follow-Up: DR. Burt Knack NEXT 10/2015; we will call you a couple months earlier to make your appt.   Any Other Special Instructions Will Be Listed Below (If Applicable).     If you need a refill on your cardiac medications before your next appointment, please call your pharmacy.

## 2015-04-13 ENCOUNTER — Emergency Department (HOSPITAL_BASED_OUTPATIENT_CLINIC_OR_DEPARTMENT_OTHER)
Admission: EM | Admit: 2015-04-13 | Discharge: 2015-04-13 | Disposition: A | Discharge status: Home / Self Care | Payer: Commercial Managed Care - HMO | Attending: Emergency Medicine | Admitting: Emergency Medicine

## 2015-04-13 ENCOUNTER — Emergency Department (HOSPITAL_BASED_OUTPATIENT_CLINIC_OR_DEPARTMENT_OTHER): Payer: Commercial Managed Care - HMO

## 2015-04-13 ENCOUNTER — Encounter (HOSPITAL_BASED_OUTPATIENT_CLINIC_OR_DEPARTMENT_OTHER): Payer: Self-pay | Admitting: Emergency Medicine

## 2015-04-13 DIAGNOSIS — Z7982 Long term (current) use of aspirin: Secondary | ICD-10-CM | POA: Diagnosis not present

## 2015-04-13 DIAGNOSIS — I129 Hypertensive chronic kidney disease with stage 1 through stage 4 chronic kidney disease, or unspecified chronic kidney disease: Secondary | ICD-10-CM | POA: Insufficient documentation

## 2015-04-13 DIAGNOSIS — I519 Heart disease, unspecified: Secondary | ICD-10-CM | POA: Diagnosis not present

## 2015-04-13 DIAGNOSIS — Z951 Presence of aortocoronary bypass graft: Secondary | ICD-10-CM | POA: Insufficient documentation

## 2015-04-13 DIAGNOSIS — I2510 Atherosclerotic heart disease of native coronary artery without angina pectoris: Secondary | ICD-10-CM | POA: Insufficient documentation

## 2015-04-13 DIAGNOSIS — R112 Nausea with vomiting, unspecified: Secondary | ICD-10-CM | POA: Diagnosis not present

## 2015-04-13 DIAGNOSIS — R109 Unspecified abdominal pain: Secondary | ICD-10-CM | POA: Diagnosis not present

## 2015-04-13 DIAGNOSIS — Z8719 Personal history of other diseases of the digestive system: Secondary | ICD-10-CM | POA: Insufficient documentation

## 2015-04-13 DIAGNOSIS — E119 Type 2 diabetes mellitus without complications: Secondary | ICD-10-CM | POA: Insufficient documentation

## 2015-04-13 DIAGNOSIS — Z87891 Personal history of nicotine dependence: Secondary | ICD-10-CM | POA: Insufficient documentation

## 2015-04-13 DIAGNOSIS — Z79899 Other long term (current) drug therapy: Secondary | ICD-10-CM | POA: Diagnosis not present

## 2015-04-13 DIAGNOSIS — N400 Enlarged prostate without lower urinary tract symptoms: Secondary | ICD-10-CM | POA: Insufficient documentation

## 2015-04-13 DIAGNOSIS — I728 Aneurysm of other specified arteries: Secondary | ICD-10-CM | POA: Diagnosis not present

## 2015-04-13 DIAGNOSIS — J45909 Unspecified asthma, uncomplicated: Secondary | ICD-10-CM | POA: Diagnosis not present

## 2015-04-13 DIAGNOSIS — E785 Hyperlipidemia, unspecified: Secondary | ICD-10-CM | POA: Diagnosis not present

## 2015-04-13 DIAGNOSIS — N183 Chronic kidney disease, stage 3 (moderate): Secondary | ICD-10-CM | POA: Insufficient documentation

## 2015-04-13 DIAGNOSIS — R197 Diarrhea, unspecified: Secondary | ICD-10-CM | POA: Diagnosis not present

## 2015-04-13 LAB — URINALYSIS, ROUTINE W REFLEX MICROSCOPIC
Bilirubin Urine: NEGATIVE
Glucose, UA: >1000 mg/dL — AB
Ketones, ur: 15 mg/dL — AB
Leukocytes, UA: NEGATIVE
Nitrite: NEGATIVE
Protein, ur: 100 mg/dL — AB
Specific Gravity, Urine: 1.027 (ref 1.005–1.030)
Urobilinogen, UA: 0.2 mg/dL (ref 0.0–1.0)
pH: 5.5 (ref 5.0–8.0)

## 2015-04-13 LAB — COMPREHENSIVE METABOLIC PANEL
ALT: 18 U/L (ref 17–63)
AST: 24 U/L (ref 15–41)
Albumin: 3.6 g/dL (ref 3.5–5.0)
Alkaline Phosphatase: 69 U/L (ref 38–126)
Anion gap: 9 (ref 5–15)
BUN: 15 mg/dL (ref 6–20)
CO2: 26 mmol/L (ref 22–32)
Calcium: 8.7 mg/dL — ABNORMAL LOW (ref 8.9–10.3)
Chloride: 96 mmol/L — ABNORMAL LOW (ref 101–111)
Creatinine, Ser: 1.3 mg/dL — ABNORMAL HIGH (ref 0.61–1.24)
GFR calc Af Amer: 60 mL/min — ABNORMAL LOW (ref >60)
GFR calc non Af Amer: 52 mL/min — ABNORMAL LOW (ref >60)
Glucose, Bld: 319 mg/dL — ABNORMAL HIGH (ref 65–99)
Potassium: 4.1 mmol/L (ref 3.5–5.1)
Sodium: 131 mmol/L — ABNORMAL LOW (ref 135–145)
Total Bilirubin: 0.4 mg/dL (ref 0.3–1.2)
Total Protein: 7.1 g/dL (ref 6.5–8.1)

## 2015-04-13 LAB — CBC WITH DIFFERENTIAL/PLATELET
Basophils Absolute: 0 10*3/uL (ref 0.0–0.1)
Basophils Relative: 0 %
Eosinophils Absolute: 0 10*3/uL (ref 0.0–0.7)
Eosinophils Relative: 0 %
HCT: 34.1 % — ABNORMAL LOW (ref 39.0–52.0)
Hemoglobin: 11.1 g/dL — ABNORMAL LOW (ref 13.0–17.0)
Lymphocytes Relative: 5 %
Lymphs Abs: 0.3 10*3/uL — ABNORMAL LOW (ref 0.7–4.0)
MCH: 29.2 pg (ref 26.0–34.0)
MCHC: 32.6 g/dL (ref 30.0–36.0)
MCV: 89.7 fL (ref 78.0–100.0)
Monocytes Absolute: 0.4 10*3/uL (ref 0.1–1.0)
Monocytes Relative: 6 %
Neutro Abs: 5.9 10*3/uL (ref 1.7–7.7)
Neutrophils Relative %: 89 %
Platelets: 260 10*3/uL (ref 150–400)
RBC: 3.8 MIL/uL — ABNORMAL LOW (ref 4.22–5.81)
RDW: 14 % (ref 11.5–15.5)
WBC: 6.7 10*3/uL (ref 4.0–10.5)

## 2015-04-13 LAB — URINE MICROSCOPIC-ADD ON

## 2015-04-13 LAB — TROPONIN I: Troponin I: <0.03 ng/mL (ref <0.031)

## 2015-04-13 LAB — I-STAT CG4 LACTIC ACID, ED: Lactic Acid, Venous: 1.8 mmol/L (ref 0.5–2.0)

## 2015-04-13 LAB — LIPASE, BLOOD: Lipase: 25 U/L (ref 11–51)

## 2015-04-13 MED ORDER — ONDANSETRON HCL 4 MG/2ML IJ SOLN
4.0000 mg | Freq: Once | INTRAMUSCULAR | Status: AC
  Administered 2015-04-13: 4 mg via INTRAVENOUS
  Filled 2015-04-13: qty 2

## 2015-04-13 MED ORDER — ONDANSETRON HCL 4 MG PO TABS: 4.0000 mg | ORAL_TABLET | Freq: Four times a day (QID) | ORAL | Status: DC

## 2015-04-13 MED ORDER — SODIUM CHLORIDE 0.9 % IV BOLUS (SEPSIS)
1000.0000 mL | Freq: Once | INTRAVENOUS | Status: AC
  Administered 2015-04-13: 1000 mL via INTRAVENOUS

## 2015-04-13 MED ORDER — IOHEXOL 300 MG/ML  SOLN
100.0000 mL | Freq: Once | INTRAMUSCULAR | Status: AC | PRN
  Administered 2015-04-13: 100 mL via INTRAVENOUS

## 2015-04-13 NOTE — ED Notes (Signed)
Abdominal pain with N/V/D since Monday.  Some hot and cold spells.  Also having headache.

## 2015-04-13 NOTE — Discharge Instructions (Signed)
Return without fail for worsening symptoms, including vomiting unable to keep down food or fluids, bloody or black stools, fever, or severe unremitting abdominal pain. Please follow-up with her primary care doctor in 2-3 days for recheck. You're also given information to follow up with the vascular surgeon as an outpatient.  Nausea and Vomiting Nausea is a sick feeling that often comes before throwing up (vomiting). Vomiting is a reflex where stomach contents come out of your mouth. Vomiting can cause severe loss of body fluids (dehydration). Children and elderly adults can become dehydrated quickly, especially if they also have diarrhea. Nausea and vomiting are symptoms of a condition or disease. It is important to find the cause of your symptoms. CAUSES   Direct irritation of the stomach lining. This irritation can result from increased acid production (gastroesophageal reflux disease), infection, food poisoning, taking certain medicines (such as nonsteroidal anti-inflammatory drugs), alcohol use, or tobacco use.  Signals from the brain.These signals could be caused by a headache, heat exposure, an inner ear disturbance, increased pressure in the brain from injury, infection, a tumor, or a concussion, pain, emotional stimulus, or metabolic problems.  An obstruction in the gastrointestinal tract (bowel obstruction).  Illnesses such as diabetes, hepatitis, gallbladder problems, appendicitis, kidney problems, cancer, sepsis, atypical symptoms of a heart attack, or eating disorders.  Medical treatments such as chemotherapy and radiation.  Receiving medicine that makes you sleep (general anesthetic) during surgery. DIAGNOSIS Your caregiver may ask for tests to be done if the problems do not improve after a few days. Tests may also be done if symptoms are severe or if the reason for the nausea and vomiting is not clear. Tests may include:  Urine tests.  Blood tests.  Stool tests.  Cultures (to  look for evidence of infection).  X-rays or other imaging studies. Test results can help your caregiver make decisions about treatment or the need for additional tests. TREATMENT You need to stay well hydrated. Drink frequently but in small amounts.You may wish to drink water, sports drinks, clear broth, or eat frozen ice pops or gelatin dessert to help stay hydrated.When you eat, eating slowly may help prevent nausea.There are also some antinausea medicines that may help prevent nausea. HOME CARE INSTRUCTIONS   Take all medicine as directed by your caregiver.  If you do not have an appetite, do not force yourself to eat. However, you must continue to drink fluids.  If you have an appetite, eat a normal diet unless your caregiver tells you differently.  Eat a variety of complex carbohydrates (rice, wheat, potatoes, bread), lean meats, yogurt, fruits, and vegetables.  Avoid high-fat foods because they are more difficult to digest.  Drink enough water and fluids to keep your urine clear or pale yellow.  If you are dehydrated, ask your caregiver for specific rehydration instructions. Signs of dehydration may include:  Severe thirst.  Dry lips and mouth.  Dizziness.  Dark urine.  Decreasing urine frequency and amount.  Confusion.  Rapid breathing or pulse. SEEK IMMEDIATE MEDICAL CARE IF:   You have blood or brown flecks (like coffee grounds) in your vomit.  You have black or bloody stools.  You have a severe headache or stiff neck.  You are confused.  You have severe abdominal pain.  You have chest pain or trouble breathing.  You do not urinate at least once every 8 hours.  You develop cold or clammy skin.  You continue to vomit for longer than 24 to 48 hours.  You have a fever. MAKE SURE YOU:   Understand these instructions.  Will watch your condition.  Will get help right away if you are not doing well or get worse.   This information is not intended  to replace advice given to you by your health care provider. Make sure you discuss any questions you have with your health care provider.   Document Released: 05/21/2005 Document Revised: 08/13/2011 Document Reviewed: 10/18/2010 Elsevier Interactive Patient Education 2016 [employer].  Diarrhea Diarrhea is frequent loose and watery bowel movements. It can cause you to feel weak and dehydrated. Dehydration can cause you to become tired and thirsty, have a dry mouth, and have decreased urination that often is dark yellow. Diarrhea is a sign of another problem, most often an infection that will not last long. In most cases, diarrhea typically lasts 2-3 days. However, it can last longer if it is a sign of something more serious. It is important to treat your diarrhea as directed by your caregiver to lessen or prevent future episodes of diarrhea. CAUSES  Some common causes include:  Gastrointestinal infections caused by viruses, bacteria, or parasites.  Food poisoning or food allergies.  Certain medicines, such as antibiotics, chemotherapy, and laxatives.  Artificial sweeteners and fructose.  Digestive disorders. HOME CARE INSTRUCTIONS  Ensure adequate fluid intake (hydration): Have 1 cup (8 oz) of fluid for each diarrhea episode. Avoid fluids that contain simple sugars or sports drinks, fruit juices, whole milk products, and sodas. Your urine should be clear or pale yellow if you are drinking enough fluids. Hydrate with an oral rehydration solution that you can purchase at pharmacies, retail stores, and online. You can prepare an oral rehydration solution at home by mixing the following ingredients together:   - tsp table salt.   tsp baking soda.   tsp salt substitute containing potassium chloride.  1  tablespoons sugar.  1 L (34 oz) of water.  Certain foods and beverages may increase the speed at which food moves through the gastrointestinal (GI) tract. These foods and beverages  should be avoided and include:  Caffeinated and alcoholic beverages.  High-fiber foods, such as raw fruits and vegetables, nuts, seeds, and whole grain breads and cereals.  Foods and beverages sweetened with sugar alcohols, such as xylitol, sorbitol, and mannitol.  Some foods may be well tolerated and may help thicken stool including:  Starchy foods, such as rice, toast, pasta, low-sugar cereal, oatmeal, grits, baked potatoes, crackers, and bagels.  Bananas.  Applesauce.  Add probiotic-rich foods to help increase healthy bacteria in the GI tract, such as yogurt and fermented milk products.  Wash your hands well after each diarrhea episode.  Only take over-the-counter or prescription medicines as directed by your caregiver.  Take a warm bath to relieve any burning or pain from frequent diarrhea episodes. SEEK IMMEDIATE MEDICAL CARE IF:   You are unable to keep fluids down.  You have persistent vomiting.  You have blood in your stool, or your stools are black and tarry.  You do not urinate in 6-8 hours, or there is only a small amount of very dark urine.  You have abdominal pain that increases or localizes.  You have weakness, dizziness, confusion, or light-headedness.  You have a severe headache.  Your diarrhea gets worse or does not get better.  You have a fever or persistent symptoms for more than 2-3 days.  You have a fever and your symptoms suddenly get worse. MAKE SURE YOU:   Understand  these instructions.  Will watch your condition.  Will get help right away if you are not doing well or get worse.   This information is not intended to replace advice given to you by your health care provider. Make sure you discuss any questions you have with your health care provider.   Document Released: 05/11/2002 Document Revised: 06/11/2014 Document Reviewed: 01/27/2012 Elsevier Interactive Patient Education [employer].

## 2015-04-13 NOTE — ED Notes (Signed)
Patient transported to CT 

## 2015-04-13 NOTE — ED Provider Notes (Signed)
CSN: [id number]     Arrival date & time 04/13/15  0804 History   First MD Initiated Contact with Patient 04/13/15 [phone]     Chief Complaint  Patient presents with  . Abdominal Pain     (Consider location/radiation/quality/duration/timing/severity/associated sxs/prior Treatment) HPI 74 year old male who presents with abdominal pain, nausea, vomiting, and diarrhea. History of CAD with CABG, hypertension, and hyperlipidemia. States that 1 week ago had a mild bout of GI illness with nausea, vomiting, and diarrhea which initially improved. 2 days ago, had recurrence of abdominal pain, that was generalized with intractable nausea and vomiting. Has had about 6-8 episodes of both vomiting and diarrhea. Pain intermittent and diffuse in nature.  Denies any hematemesis, melena, or hematochezia. Denies fevers, but has been feeling hot and cold. Has not had any dysuria, urinary frequency, difficulty breathing, or severe chest pain. No known sick contacts. No recent antibiotics or traveling. Past Medical History  Diagnosis Date  . Allergy   . Diabetes ([place])     AODM  . Asthma   . Hyperlipidemia   . Essential hypertension   . CAD (coronary artery disease)     a. s/p MI 09/1993;  b. known CTO of RCA;  c. 06/2014 Cath: LM short/mod dzs, LAD 95ost, LCX nl, RI nl, RCA 100, EF 55%;  c. 06/2014 s/p CABG x 4: LIMA->LAD, VG->D1, VG->OM1, VG->Acute Marginal.  . BPH (benign prostatic hyperplasia)   . GERD (gastroesophageal reflux disease)   . Diastolic dysfunction     a. 05/2015 Echo: EF 60-65%, LVH, Gr 1 DD, mild MR, mod dil RA/LA, mod TR, PASP 79mmHg.  . CKD (chronic kidney disease), stage III    Past Surgical History  Procedure Laterality Date  . Back surgery  2000    back fusion - Dr. Earnie Larsson  . Vasectomy  1963  . Angioplasty  09/22/1993    POBA of RCA (Dr. Marella Chimes)  . Transthoracic echocardiogram  10/20/2012    EF 55-60%, moderate concentric hypertrophy, ventricular septum thickness increased,  calcified MV annulus  . Nm myocar perf wall motion  09/19/2009    bruce myoview - mild perfusion defect in basal inferior region (infarct/scar), EF 60%, low risk scan  . Cardiac catheterization  11/28/1998    "silent occlusion" of RCA w/collaterals from left coronary system, prox LAD w/40-50% eccentric narrowing, 1st diagonal with 70-80% eccentric narrowing, 85% narrowing of prox small OM1 (Dr. Marella Chimes)  . Renal doppler  10/2011    SMA w/ 70-99% diameter reduction & high grade stenosis; R&L renals w/narrowing and increased velocities (60-99%), R kidney smaller than L  . Carotid doppler  07/2011    left subclavian (50-69%); right bulb (0-49%); RICA (normal); left mid-distal CCA (0-49%); left bulb/prox ICA (50-69%); left vertebral with abnormal antegrade flow  . Nasal sinus surgery  2010  . Left heart catheterization with coronary angiogram N/A 05/19/2014    Procedure: LEFT HEART CATHETERIZATION WITH CORONARY ANGIOGRAM;  Surgeon: Blane Ohara, MD;  Location: [facility] CATH LAB;  Service: Cardiovascular;  Laterality: N/A;  . Coronary artery bypass graft N/A 05/21/2014    Procedure: CORONARY ARTERY BYPASS GRAFTING (CABG) x  four, using left internal mammary artery and right leg greater saphenous vein harvested endoscopically;  Surgeon: Melrose Nakayama, MD;  Location: [place];  Service: Open Heart Surgery;  Laterality: N/A;  . Intraoperative transesophageal echocardiogram N/A 05/21/2014    Procedure: INTRAOPERATIVE TRANSESOPHAGEAL ECHOCARDIOGRAM;  Surgeon: Melrose Nakayama, MD;  Location: [place];  Service: Open  Heart Surgery;  Laterality: N/A;   Family History  Problem Relation Age of Onset  . Colon cancer Neg Hx   . Uterine cancer Mother   . Diabetes Sister   . Colon polyps Brother   . Heart disease Brother   . Stroke Father    Social History  Substance Use Topics  . Smoking status: Former Smoker    Types: Cigarettes    Quit date: 06/04/1972  . Smokeless tobacco: Current User     Types: Snuff     Comment: quit about 40 years  . Alcohol Use: 1.8 oz/week    3 Standard drinks or equivalent per week    Review of Systems 10/14 systems reviewed and are negative other than those stated in the HPI    Allergies  Lasix  Home Medications   Prior to Admission medications   Medication Sig Start Date End Date Taking? Authorizing Provider  acarbose (PRECOSE) 50 MG tablet Take 50 mg by mouth 3 (three) times daily with meals.  03/29/15   Historical Provider, MD  [place] test strip  08/21/13   Historical Provider, MD  aspirin 81 MG tablet Take 81 mg by mouth daily.    Historical Provider, MD  glimepiride (AMARYL) 4 MG tablet Take 4 mg by mouth 2 (two) times daily.    Historical Provider, MD  Lancets Misc. (ACCU-CHEK SOFTCLIX LANCET DEV) KIT  08/21/13   Historical Provider, MD  metFORMIN (GLUCOPHAGE) 1000 MG tablet Take 1,000 mg by mouth 2 (two) times daily with a meal.  08/25/14   Historical Provider, MD  Multiple Vitamins-Minerals (CENTRUM SILVER ADULT 50+ PO) Take by mouth.    Historical Provider, MD  nitroGLYCERIN (NITROSTAT) 0.4 MG SL tablet Place 1 tablet (0.4 mg total) under the tongue every 5 (five) minutes as needed for chest pain. 02/12/13   Erlene Quan, PA-C  ondansetron (ZOFRAN) 4 MG tablet Take 1 tablet (4 mg total) by mouth every 6 (six) hours. 04/13/15   Forde Dandy, MD  simvastatin (ZOCOR) 40 MG tablet Take 1 tablet (40 mg total) by mouth every evening. 04/13/13   Lorretta Harp, MD  torsemide (DEMADEX) 20 MG tablet Take 1 tablet (20 mg total) by mouth daily. 12/10/14   [name], MD  valsartan (DIOVAN) 80 MG tablet Take 1 tablet (80 mg total) by mouth daily. 12/10/14   [name], MD  vitamin B-12 (CYANOCOBALAMIN) 100 MCG tablet Take 50 mcg by mouth daily.    Historical Provider, MD   BP 166/91 mmHg  Pulse 55  Temp(Src) 97.6 F (36.4 C) (Oral)  Resp 18  Ht $R'5\' 10"'cl$  (1.778 m)  Wt 170 lb (77.111 kg)  BMI 24.39 kg/m2  SpO2 100% Physical  Exam Physical Exam  Nursing note and vitals reviewed. Constitutional: Well developed, well nourished, non-toxic, and in no acute distress Head: Normocephalic and atraumatic.  Mouth/Throat: Oropharynx is clear. Mucous membranes dry.  Neck: Normal range of motion. Neck supple.  Cardiovascular: Normal rate and regular rhythm.   Pulmonary/Chest: Effort normal and breath sounds normal.  Abdominal: Soft. There is minimal generalized tenderness. There is no rebound and no guarding.  Musculoskeletal: Normal range of motion.  Neurological: Alert, no facial droop, fluent speech, moves all extremities symmetrically Skin: Skin is warm and dry.  Psychiatric: Cooperative  ED Course  Procedures (including critical care time) Labs Review Labs Reviewed  CBC WITH DIFFERENTIAL/PLATELET - Abnormal; Notable for the following:    RBC 3.80 (*)    Hemoglobin  11.1 (*)    HCT 34.1 (*)    Lymphs Abs 0.3 (*)    All other components within normal limits  COMPREHENSIVE METABOLIC PANEL - Abnormal; Notable for the following:    Sodium 131 (*)    Chloride 96 (*)    Glucose, Bld 319 (*)    Creatinine, Ser 1.30 (*)    Calcium 8.7 (*)    GFR calc non Af Amer 52 (*)    GFR calc Af Amer 60 (*)    All other components within normal limits  URINALYSIS, ROUTINE W REFLEX MICROSCOPIC (NOT AT [facility]) - Abnormal; Notable for the following:    Glucose, UA >1000 (*)    Hgb urine dipstick TRACE (*)    Ketones, ur 15 (*)    Protein, ur 100 (*)    All other components within normal limits  LIPASE, BLOOD  TROPONIN I  URINE MICROSCOPIC-ADD ON  I-STAT CG4 LACTIC ACID, ED  I-STAT CG4 LACTIC ACID, ED    Imaging Review Ct Abdomen Pelvis W Contrast  04/13/2015  CLINICAL DATA:  Nausea vomiting diarrhea.  Abdominal pain EXAM: CT ABDOMEN AND PELVIS WITH CONTRAST TECHNIQUE: Multidetector CT imaging of the abdomen and pelvis was performed using the standard protocol following bolus administration of intravenous contrast. CONTRAST:   122mL OMNIPAQUE IOHEXOL 300 MG/ML  SOLN COMPARISON:  None. FINDINGS: Lower chest:  Lung bases are clear.  Heart size is normal. Hepatobiliary: Liver is homogeneous without focal liver lesion. Normal liver size. Gallbladder contracted without abnormality. Bile ducts nondilated. Portal vein patent. Pancreas:  negative for pancreatic mass or edema. Spleen: Spleen normal in size. Calcified splenic artery aneurysm in the splenic hilum measuring 16 mm. No evidence of hemorrhage. Adrenals/Urinary Tract: Marked atrophy of the right kidney with minimal excretion of contrast. Calcified atherosclerotic disease at the origin of the renal artery bilaterally. The renal arteries appear to enhance and are patent but there appears to be severe renal artery stenosis on the right causing atrophy of the right kidney. Right renal calcification may be a nonobstructing stone measuring 4 mm. No hydronephrosis of either kidney and no renal mass identified. Adrenal gland not enlarged. Urinary bladder normal. Stomach/Bowel: Negative for bowel obstruction. No bowel edema. The patient is at risk for bowel ischemia based on the extent of atherosclerotic disease as described below. Vascular/Lymphatic: Advanced atherosclerotic calcification diffusely involving the aorta, iliac arteries, and visceral arteries. Negative for abdominal aortic aneurysm. Both iliac arteries are patent. There is atherosclerotic plaque at the renal artery origin bilaterally which appears be causing severe stenosis bilaterally. There is occlusion of the celiac origin with reconstitution. There is occlusion of the SMA origin with reconstitution via collaterals. IMA appears patent and feeding collateral vessels to the bowel. Reproductive: Mild prostate enlargement. No pelvic mass or adenopathy. Other: No free fluid. Musculoskeletal: Surgical fusion L5-S1. Disc degeneration and spurring L1-2 and L2-3. No acute bony abnormality. IMPRESSION: Severe atherosclerotic disease.  There is marked atrophy of the right kidney likely due to renal artery severe stenosis. There is also significant plaque at the origin of the left renal artery. No renal hydronephrosis. There is diffuse atherosclerotic disease the abdominal aorta without aneurysm. There is occlusion of the proximal celiac and SMA. IMA is patent and enlarged. The patient risk for bowel ischemia however no bowel edema or infarction is seen at this time. 16 mm splenic artery aneurysm without evidence of rupture. Electronically Signed   By: Franchot Gallo M.D.   On: 04/13/2015 10:52   I  have personally reviewed and evaluated these images and lab results as part of my medical decision-making.   EKG Interpretation   Date/Time:  Wednesday April 13 2015 09:42:50 EST Ventricular Rate:  55 PR Interval:  172 QRS Duration: 70 QT Interval:  450 QTC Calculation: 430 R Axis:   83 Text Interpretation:  Sinus bradycardia Nonspecific ST and T wave  abnormality Abnormal ECG TWI in lateral precordial leads, unchanged from  prior EKG No significant change since last tracing Confirmed by Alexandru Moorer MD,  Marvene Strohm ([id number]) on 04/13/2015 6:37:50 PM      MDM   Final diagnoses:  Nausea vomiting and diarrhea  Splenic artery aneurysm ([place])    74 year old male with history of DM, CAD s/p CABG who presents with N/V/D and intermittent abdominal pain. He is non-toxic on arrival. Vital signs are stable. Appears dry on exam. Abdomen soft with minimal generalized abdominal discomfort. No significant leukocyotsis, no elevated lactic, baseline renal function, baseline anemia. Evidence of hyperglycemia without DKA. Trop negative and EKG non-ischemic. Do not think this is atypical presentation of ACS. CT abd/pelvis performed given comorbidities and age. No acute intraabdominal processes noted. However, he does have severe and diffuse atherosclerotic disease involving abdominal vasculature with splenic artery aneurysm. No evidence of ischemic colitis on  CT, abdomen benign and no pain out of proportion to exam. I do not think presentation today is 2/2 to mesenteric ischemia or CT findings. Presentation today more consistent with gastroenteritis. Did discuss these findings with Dr. Bridgett Larsson from vascular surgery as patient does report that he has prior history of postprandial pain. No acute interventions at this time recommended. Dr. Bridgett Larsson will follow-up with patient as outpatient. Follow-up reviewed with patient as well as warning signs for mesenteric ischemia. The patient feels improved with IVF and antiemetics. Able to tolerate PO intake without abdominal pain or vomiting. Felt stable for discharge home. Strict return and follow-up instructions reviewed. He expressed understanding of all discharge instructions and felt comfortable with the plan of care.     Forde Dandy, MD 04/13/15 [phone]

## 2015-04-15 ENCOUNTER — Emergency Department (HOSPITAL_BASED_OUTPATIENT_CLINIC_OR_DEPARTMENT_OTHER)
Admission: EM | Admit: 2015-04-15 | Discharge: 2015-04-15 | Disposition: A | Discharge status: Home / Self Care | Payer: Commercial Managed Care - HMO | Attending: Emergency Medicine | Admitting: Emergency Medicine

## 2015-04-15 ENCOUNTER — Emergency Department (HOSPITAL_BASED_OUTPATIENT_CLINIC_OR_DEPARTMENT_OTHER): Payer: Commercial Managed Care - HMO

## 2015-04-15 ENCOUNTER — Encounter (HOSPITAL_BASED_OUTPATIENT_CLINIC_OR_DEPARTMENT_OTHER): Payer: Self-pay | Admitting: *Deleted

## 2015-04-15 DIAGNOSIS — Z79899 Other long term (current) drug therapy: Secondary | ICD-10-CM | POA: Diagnosis not present

## 2015-04-15 DIAGNOSIS — K640 First degree hemorrhoids: Secondary | ICD-10-CM

## 2015-04-15 DIAGNOSIS — K219 Gastro-esophageal reflux disease without esophagitis: Secondary | ICD-10-CM | POA: Insufficient documentation

## 2015-04-15 DIAGNOSIS — Z87438 Personal history of other diseases of male genital organs: Secondary | ICD-10-CM | POA: Diagnosis not present

## 2015-04-15 DIAGNOSIS — Z7982 Long term (current) use of aspirin: Secondary | ICD-10-CM | POA: Diagnosis not present

## 2015-04-15 DIAGNOSIS — Z87891 Personal history of nicotine dependence: Secondary | ICD-10-CM | POA: Diagnosis not present

## 2015-04-15 DIAGNOSIS — Z7984 Long term (current) use of oral hypoglycemic drugs: Secondary | ICD-10-CM | POA: Insufficient documentation

## 2015-04-15 DIAGNOSIS — E119 Type 2 diabetes mellitus without complications: Secondary | ICD-10-CM | POA: Insufficient documentation

## 2015-04-15 DIAGNOSIS — I728 Aneurysm of other specified arteries: Secondary | ICD-10-CM | POA: Diagnosis not present

## 2015-04-15 DIAGNOSIS — Z9889 Other specified postprocedural states: Secondary | ICD-10-CM | POA: Diagnosis not present

## 2015-04-15 DIAGNOSIS — Z794 Long term (current) use of insulin: Secondary | ICD-10-CM | POA: Diagnosis not present

## 2015-04-15 DIAGNOSIS — K921 Melena: Secondary | ICD-10-CM | POA: Diagnosis present

## 2015-04-15 DIAGNOSIS — J45909 Unspecified asthma, uncomplicated: Secondary | ICD-10-CM | POA: Diagnosis not present

## 2015-04-15 DIAGNOSIS — N183 Chronic kidney disease, stage 3 (moderate): Secondary | ICD-10-CM | POA: Insufficient documentation

## 2015-04-15 DIAGNOSIS — Z951 Presence of aortocoronary bypass graft: Secondary | ICD-10-CM | POA: Insufficient documentation

## 2015-04-15 DIAGNOSIS — K651 Peritoneal abscess: Secondary | ICD-10-CM | POA: Insufficient documentation

## 2015-04-15 DIAGNOSIS — I129 Hypertensive chronic kidney disease with stage 1 through stage 4 chronic kidney disease, or unspecified chronic kidney disease: Secondary | ICD-10-CM | POA: Diagnosis not present

## 2015-04-15 DIAGNOSIS — R197 Diarrhea, unspecified: Secondary | ICD-10-CM | POA: Diagnosis not present

## 2015-04-15 LAB — OCCULT BLOOD X 1 CARD TO LAB, STOOL: Fecal Occult Bld: NEGATIVE

## 2015-04-15 MED ORDER — ALIGN PO CAPS: 1.0000 | ORAL_CAPSULE | Freq: Three times a day (TID) | ORAL | Status: DC

## 2015-04-15 MED ORDER — GI COCKTAIL ~~LOC~~
30.0000 mL | Freq: Once | ORAL | Status: AC
  Administered 2015-04-15: 30 mL via ORAL
  Filled 2015-04-15: qty 30

## 2015-04-15 MED ORDER — DICYCLOMINE HCL 20 MG PO TABS: 20.0000 mg | ORAL_TABLET | Freq: Two times a day (BID) | ORAL | Status: DC

## 2015-04-15 MED ORDER — PANTOPRAZOLE SODIUM 20 MG PO TBEC: 20.0000 mg | DELAYED_RELEASE_TABLET | Freq: Every day | ORAL | Status: DC

## 2015-04-15 MED ORDER — HYDROCORTISONE 2.5 % RE CREA: 1.0000 "application " | TOPICAL_CREAM | Freq: Two times a day (BID) | RECTAL | Status: DC

## 2015-04-15 NOTE — ED Provider Notes (Addendum)
CSN: [id number]     Arrival date & time 04/15/15  0600 History   First MD Initiated Contact with Patient 04/15/15 [phone]     Chief Complaint  Patient presents with  . Melena     (Consider location/radiation/quality/duration/timing/severity/associated sxs/prior Treatment) Patient is a 74 y.o. male presenting with diarrhea. The history is provided by the patient.  Diarrhea Quality:  Semi-solid Severity:  Moderate Onset quality:  Gradual Duration:  2 days Timing:  Sporadic Progression:  Improving Relieved by:  Nothing Worsened by:  Nothing tried Ineffective treatments: peptobismol. Associated symptoms: no chills, no diaphoresis, no fever and no vomiting   Risk factors: no recent antibiotic use   Patient seen within the past 48 hours for n/v/d.  Thought to be viral in etiology as no recent travel or abx usage.  Comes in today because stool is still not completely solid and is dark in color.  Originally denied, but wife then stated patient was taking peptobismol  Past Medical History  Diagnosis Date  . Allergy   . Diabetes ([place])     AODM  . Asthma   . Hyperlipidemia   . Essential hypertension   . CAD (coronary artery disease)     a. s/p MI 09/1993;  b. known CTO of RCA;  c. 06/2014 Cath: LM short/mod dzs, LAD 95ost, LCX nl, RI nl, RCA 100, EF 55%;  c. 06/2014 s/p CABG x 4: LIMA->LAD, VG->D1, VG->OM1, VG->Acute Marginal.  . BPH (benign prostatic hyperplasia)   . GERD (gastroesophageal reflux disease)   . Diastolic dysfunction     a. 05/2015 Echo: EF 60-65%, LVH, Gr 1 DD, mild MR, mod dil RA/LA, mod TR, PASP 33mHg.  . CKD (chronic kidney disease), stage III    Past Surgical History  Procedure Laterality Date  . Back surgery  2000    back fusion - Dr. HEarnie Larsson . Vasectomy  1963  . Angioplasty  09/22/1993    POBA of RCA (Dr. RMarella Chimes  . Transthoracic echocardiogram  10/20/2012    EF 55-60%, moderate concentric hypertrophy, ventricular septum thickness increased, calcified  MV annulus  . Nm myocar perf wall motion  09/19/2009    bruce myoview - mild perfusion defect in basal inferior region (infarct/scar), EF 60%, low risk scan  . Cardiac catheterization  11/28/1998    "silent occlusion" of RCA w/collaterals from left coronary system, prox LAD w/40-50% eccentric narrowing, 1st diagonal with 70-80% eccentric narrowing, 85% narrowing of prox small OM1 (Dr. RMarella Chimes  . Renal doppler  10/2011    SMA w/ 70-99% diameter reduction & high grade stenosis; R&L renals w/narrowing and increased velocities (60-99%), R kidney smaller than L  . Carotid doppler  07/2011    left subclavian (50-69%); right bulb (0-49%); RICA (normal); left mid-distal CCA (0-49%); left bulb/prox ICA (50-69%); left vertebral with abnormal antegrade flow  . Nasal sinus surgery  2010  . Left heart catheterization with coronary angiogram N/A 05/19/2014    Procedure: LEFT HEART CATHETERIZATION WITH CORONARY ANGIOGRAM;  Surgeon: MBlane Ohara MD;  [facility];  Service: Cardiovascular;  Laterality: N/A;  . Coronary artery bypass graft N/A 05/21/2014    Procedure: CORONARY ARTERY BYPASS GRAFTING (CABG) x  four, using left internal mammary artery and right leg greater saphenous vein harvested endoscopically;  Surgeon: SMelrose Nakayama MD;  [facility]  Service: Open Heart Surgery;  Laterality: N/A;  . Intraoperative transesophageal echocardiogram N/A 05/21/2014    Procedure: INTRAOPERATIVE TRANSESOPHAGEAL ECHOCARDIOGRAM;  Surgeon:  Melrose Nakayama, MD;  Location: [facility];  Service: Open Heart Surgery;  Laterality: N/A;   Family History  Problem Relation Age of Onset  . Colon cancer Neg Hx   . Uterine cancer Mother   . Diabetes Sister   . Colon polyps Brother   . Heart disease Brother   . Stroke Father    Social History  Substance Use Topics  . Smoking status: Former Smoker    Types: Cigarettes    Quit date: 06/04/1972  . Smokeless tobacco: Current User    Types: Snuff      Comment: quit about 40 years  . Alcohol Use: 1.8 oz/week    3 Standard drinks or equivalent per week    Review of Systems  Constitutional: Negative for fever, chills, diaphoresis and fatigue.  Respiratory: Negative for shortness of breath.   Cardiovascular: Negative for chest pain.  Gastrointestinal: Positive for diarrhea. Negative for nausea, vomiting, constipation, blood in stool, abdominal distention and anal bleeding.       Hemorrhoidal pain  Musculoskeletal: Negative for back pain.  Skin: Negative for color change.  Neurological: Negative for weakness.  All other systems reviewed and are negative.     Allergies  Lasix  Home Medications   Prior to Admission medications   Medication Sig Start Date End Date Taking? Authorizing Provider  acarbose (PRECOSE) 50 MG tablet Take 50 mg by mouth 3 (three) times daily with meals.  03/29/15   Historical Provider, MD  [place] test strip  08/21/13   Historical Provider, MD  aspirin 81 MG tablet Take 81 mg by mouth daily.    Historical Provider, MD  bifidobacterium infantis (ALIGN) capsule Take 1 capsule by mouth 3 (three) times daily. 04/15/15   Adreana Coull, MD  glimepiride (AMARYL) 4 MG tablet Take 4 mg by mouth 2 (two) times daily.    Historical Provider, MD  hydrocortisone (PROCTOZONE-HC) 2.5 % rectal cream Place 1 application rectally 2 (two) times daily. 04/15/15   Denya Buckingham, MD  Lancets Misc. (ACCU-CHEK SOFTCLIX LANCET DEV) KIT  08/21/13   Historical Provider, MD  metFORMIN (GLUCOPHAGE) 1000 MG tablet Take 1,000 mg by mouth 2 (two) times daily with a meal.  08/25/14   Historical Provider, MD  Multiple Vitamins-Minerals (CENTRUM SILVER ADULT 50+ PO) Take by mouth.    Historical Provider, MD  nitroGLYCERIN (NITROSTAT) 0.4 MG SL tablet Place 1 tablet (0.4 mg total) under the tongue every 5 (five) minutes as needed for chest pain. 02/12/13   Erlene Quan, PA-C  ondansetron (ZOFRAN) 4 MG tablet Take 1 tablet (4 mg total)  by mouth every 6 (six) hours. 04/13/15   Forde Dandy, MD  pantoprazole (PROTONIX) 20 MG tablet Take 1 tablet (20 mg total) by mouth daily. 04/15/15   Orlie Cundari, MD  simvastatin (ZOCOR) 40 MG tablet Take 1 tablet (40 mg total) by mouth every evening. 04/13/13   Lorretta Harp, MD  torsemide (DEMADEX) 20 MG tablet Take 1 tablet (20 mg total) by mouth daily. 12/10/14   [name], MD  valsartan (DIOVAN) 80 MG tablet Take 1 tablet (80 mg total) by mouth daily. 12/10/14   [name], MD  vitamin B-12 (CYANOCOBALAMIN) 100 MCG tablet Take 50 mcg by mouth daily.    Historical Provider, MD   BP 134/74 mmHg  Pulse 87  Temp(Src) 99 F (37.2 C) (Oral)  Resp 18  Ht _0  (1.778 m)  Wt 170 lb (77.111 kg)  BMI 24.39 kg/m2  SpO2 99% Physical Exam  Constitutional: He is oriented to person, place, and time. He appears well-developed and well-nourished. No distress.  Well appearing  HENT:  Head: Normocephalic and atraumatic.  Mouth/Throat: Oropharynx is clear and moist.  Eyes: Conjunctivae and EOM are normal. Pupils are equal, round, and reactive to light.  Neck: Normal range of motion. Neck supple.  Cardiovascular: Normal rate, regular rhythm and intact distal pulses.   Pulmonary/Chest: Effort normal and breath sounds normal. No respiratory distress. He has no wheezes. He has no rales.  Abdominal: Soft. He exhibits no distension, no abdominal bruit and no ascites. Bowel sounds are increased. There is no tenderness. There is no rigidity, no rebound, no guarding, no tenderness at McBurney's point and negative Murphy's sign.  Genitourinary: Rectum normal. Guaiac negative stool.  Chaperone present, internal and external hemorrhoids non thrombosed.  Stool is dark but a dark green color.    Musculoskeletal: Normal range of motion. He exhibits no edema or tenderness.  Neurological: He is alert and oriented to person, place, and time. He has normal reflexes.  Skin: Skin is warm and dry.   Psychiatric: Thought content normal.    ED Course  Procedures (including critical care time) Labs Review Labs Reviewed  OCCULT BLOOD X 1 CARD TO LAB, STOOL    Imaging Review Ct Abdomen Pelvis W Contrast  04/13/2015  CLINICAL DATA:  Nausea vomiting diarrhea.  Abdominal pain EXAM: CT ABDOMEN AND PELVIS WITH CONTRAST TECHNIQUE: Multidetector CT imaging of the abdomen and pelvis was performed using the standard protocol following bolus administration of intravenous contrast. CONTRAST:  154m OMNIPAQUE IOHEXOL 300 MG/ML  SOLN COMPARISON:  None. FINDINGS: Lower chest:  Lung bases are clear.  Heart size is normal. Hepatobiliary: Liver is homogeneous without focal liver lesion. Normal liver size. Gallbladder contracted without abnormality. Bile ducts nondilated. Portal vein patent. Pancreas:  negative for pancreatic mass or edema. Spleen: Spleen normal in size. Calcified splenic artery aneurysm in the splenic hilum measuring 16 mm. No evidence of hemorrhage. Adrenals/Urinary Tract: Marked atrophy of the right kidney with minimal excretion of contrast. Calcified atherosclerotic disease at the origin of the renal artery bilaterally. The renal arteries appear to enhance and are patent but there appears to be severe renal artery stenosis on the right causing atrophy of the right kidney. Right renal calcification may be a nonobstructing stone measuring 4 mm. No hydronephrosis of either kidney and no renal mass identified. Adrenal gland not enlarged. Urinary bladder normal. Stomach/Bowel: Negative for bowel obstruction. No bowel edema. The patient is at risk for bowel ischemia based on the extent of atherosclerotic disease as described below. Vascular/Lymphatic: Advanced atherosclerotic calcification diffusely involving the aorta, iliac arteries, and visceral arteries. Negative for abdominal aortic aneurysm. Both iliac arteries are patent. There is atherosclerotic plaque at the renal artery origin bilaterally which  appears be causing severe stenosis bilaterally. There is occlusion of the celiac origin with reconstitution. There is occlusion of the SMA origin with reconstitution via collaterals. IMA appears patent and feeding collateral vessels to the bowel. Reproductive: Mild prostate enlargement. No pelvic mass or adenopathy. Other: No free fluid. Musculoskeletal: Surgical fusion L5-S1. Disc degeneration and spurring L1-2 and L2-3. No acute bony abnormality. IMPRESSION: Severe atherosclerotic disease. There is marked atrophy of the right kidney likely due to renal artery severe stenosis. There is also significant plaque at the origin of the left renal artery. No renal hydronephrosis. There is diffuse atherosclerotic disease the abdominal aorta without aneurysm. There is occlusion of the proximal  celiac and SMA. IMA is patent and enlarged. The patient risk for bowel ischemia however no bowel edema or infarction is seen at this time. 16 mm splenic artery aneurysm without evidence of rupture. Electronically Signed   By: Franchot Gallo M.D.   On: 04/13/2015 10:52   I have personally reviewed and evaluated these images and lab results as part of my medical decision-making.   EKG Interpretation None      MDM   Final diagnoses:  Diarrhea, unspecified type    Dark color of stool is due to pepto-bismol through the bowel and will persist for several days. Patient is really here because he feels anxious about the incidental finding of splenic artery aneurysm on CT scan. He was concerned this is the source of the dark stool. We had a long conversation about what a splenic artery aneurysm is and why it will not cause melena. Explained that if there was a rupture the abdominal cavity with fill with blood quickly it would not slowly leak out as dark stool.  Moreover, EDP reassured the patient that his stool is not melena.  The stool is a dark green in color and heme negative even in the setting of hemorrhoids and this is  consistent with recent pepto-bismol use.  This dark color will persist for several days.  Will start a PPI and a probiotic TID and a diet to firm up stool including rice without butter nor gravy, and a bland diet high in carbs. Will print this on the patient's discharge instructions.  Recommend close follow up with his PMD and follow up with vascular surgery for ongoing monitoring of aneurysm.  Patient has asked for RX for his hemorrhoids and EDP will write for same.  Patient given instructions on what to look for with GI bleeding.  Both patient and wife verbalize understanding and agree to follow up  Wife now reporting n/v/d,  Which is reassuring that symptoms are viral.  Explained how to eradicate the virus from surfaces at home.  Safe for discharge at this time  Quanda Pavlicek, MD 04/15/15 [id number]  Veatrice Kells, MD 04/15/15 [phone]

## 2015-04-15 NOTE — Discharge Instructions (Signed)
1 cup white rice boiled without butter or gravy 2 times daily for 2 days

## 2015-04-15 NOTE — ED Notes (Signed)
/  o black stools, some abd pain and diarrhea   Was seen 2 days ago for diarrhea

## 2015-04-15 NOTE — ED Notes (Signed)
Pt c/o dark stools,  States still having diarrhea  Was seen 2 days ago for same

## 2015-04-25 ENCOUNTER — Encounter: Payer: Self-pay | Admitting: Physician Assistant

## 2015-05-17 IMAGING — CR DG CHEST 2V
2 series · 2 of 2 positions shown · non-contrast
Comparison: 05/26/2014

CLINICAL DATA: Cough with congestion since surgery and 05/21/2014
(CABG), history hypertension, diabetes, cerebral vascular disease,
former smoker

EXAM:
CHEST  2 VIEW

[w chest pa]
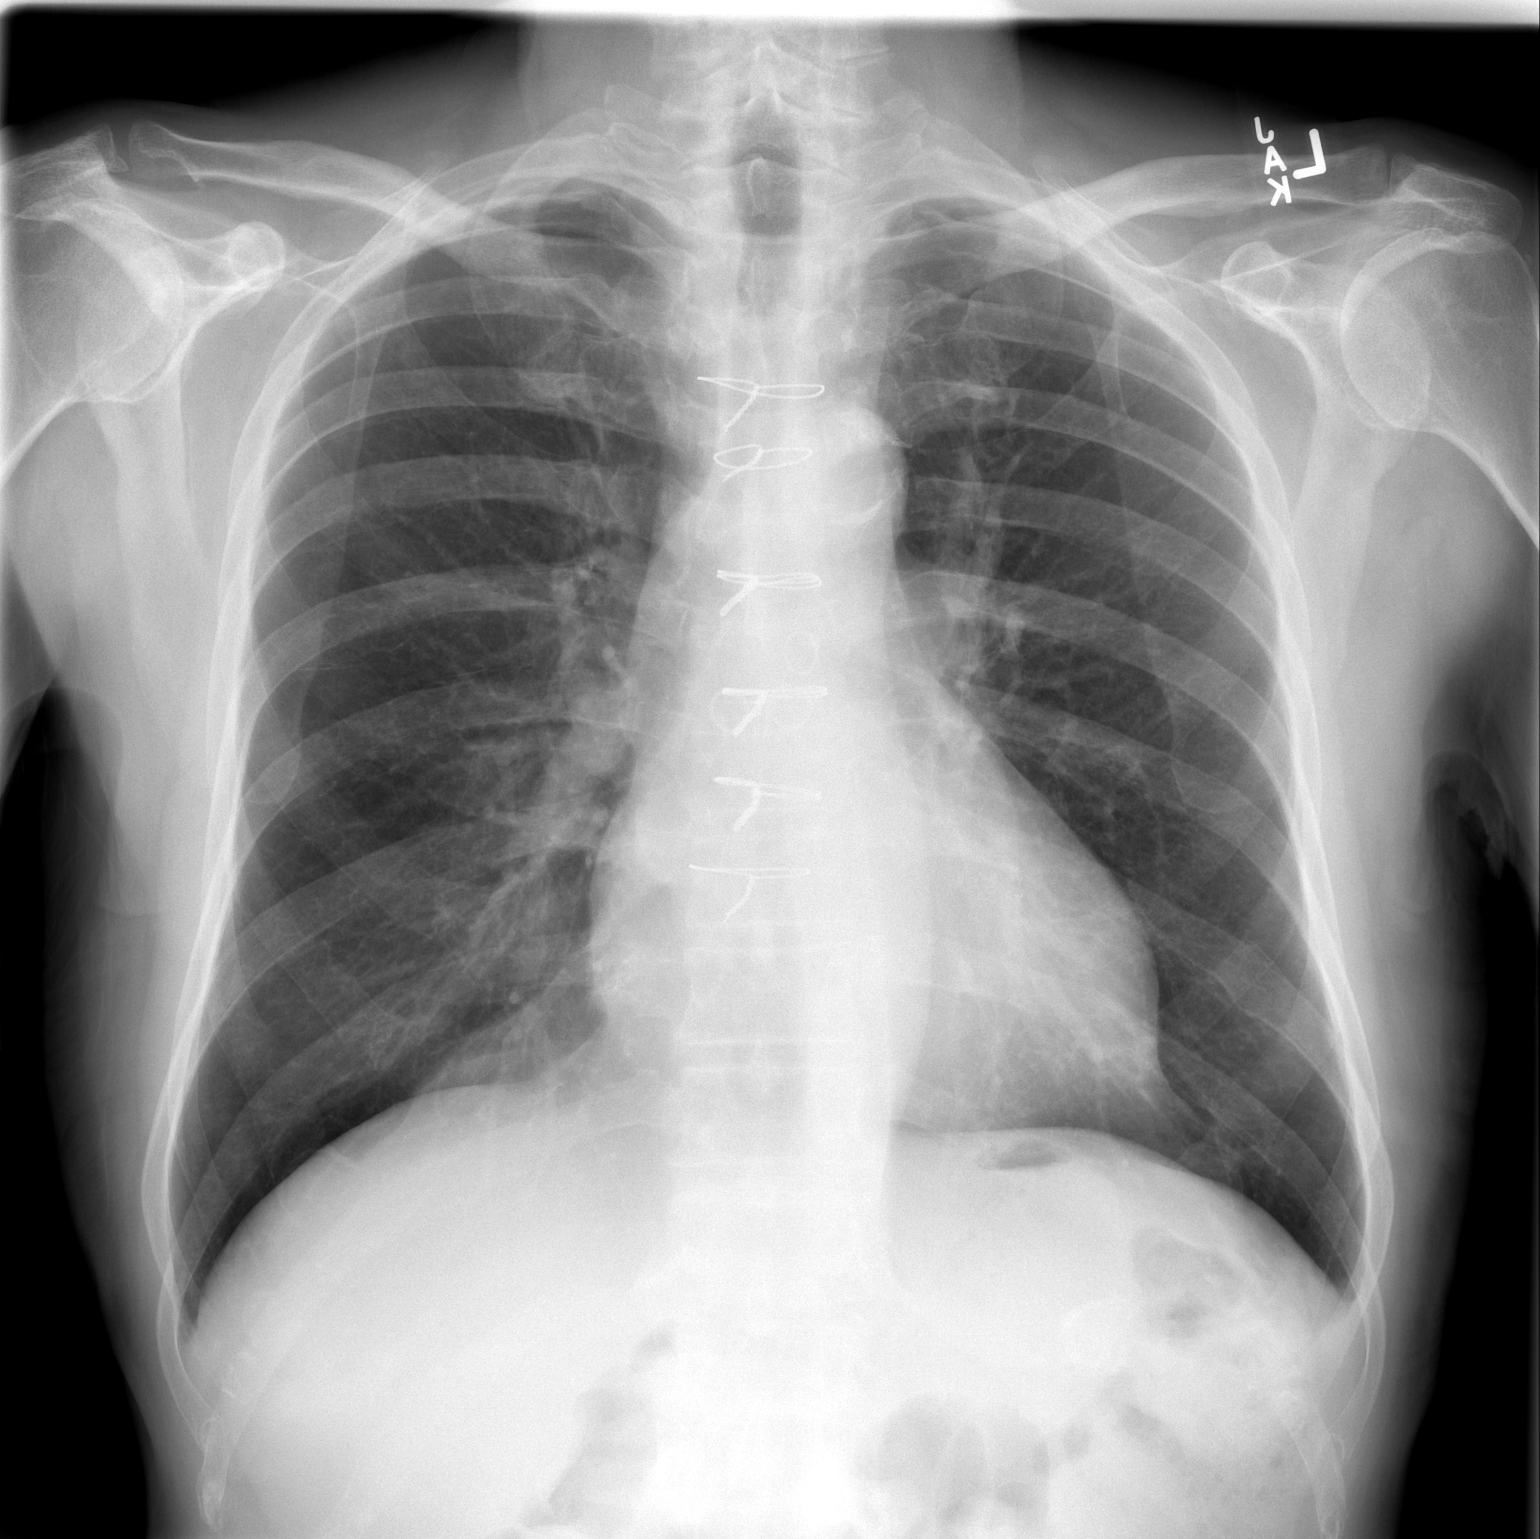

[w chest lat]
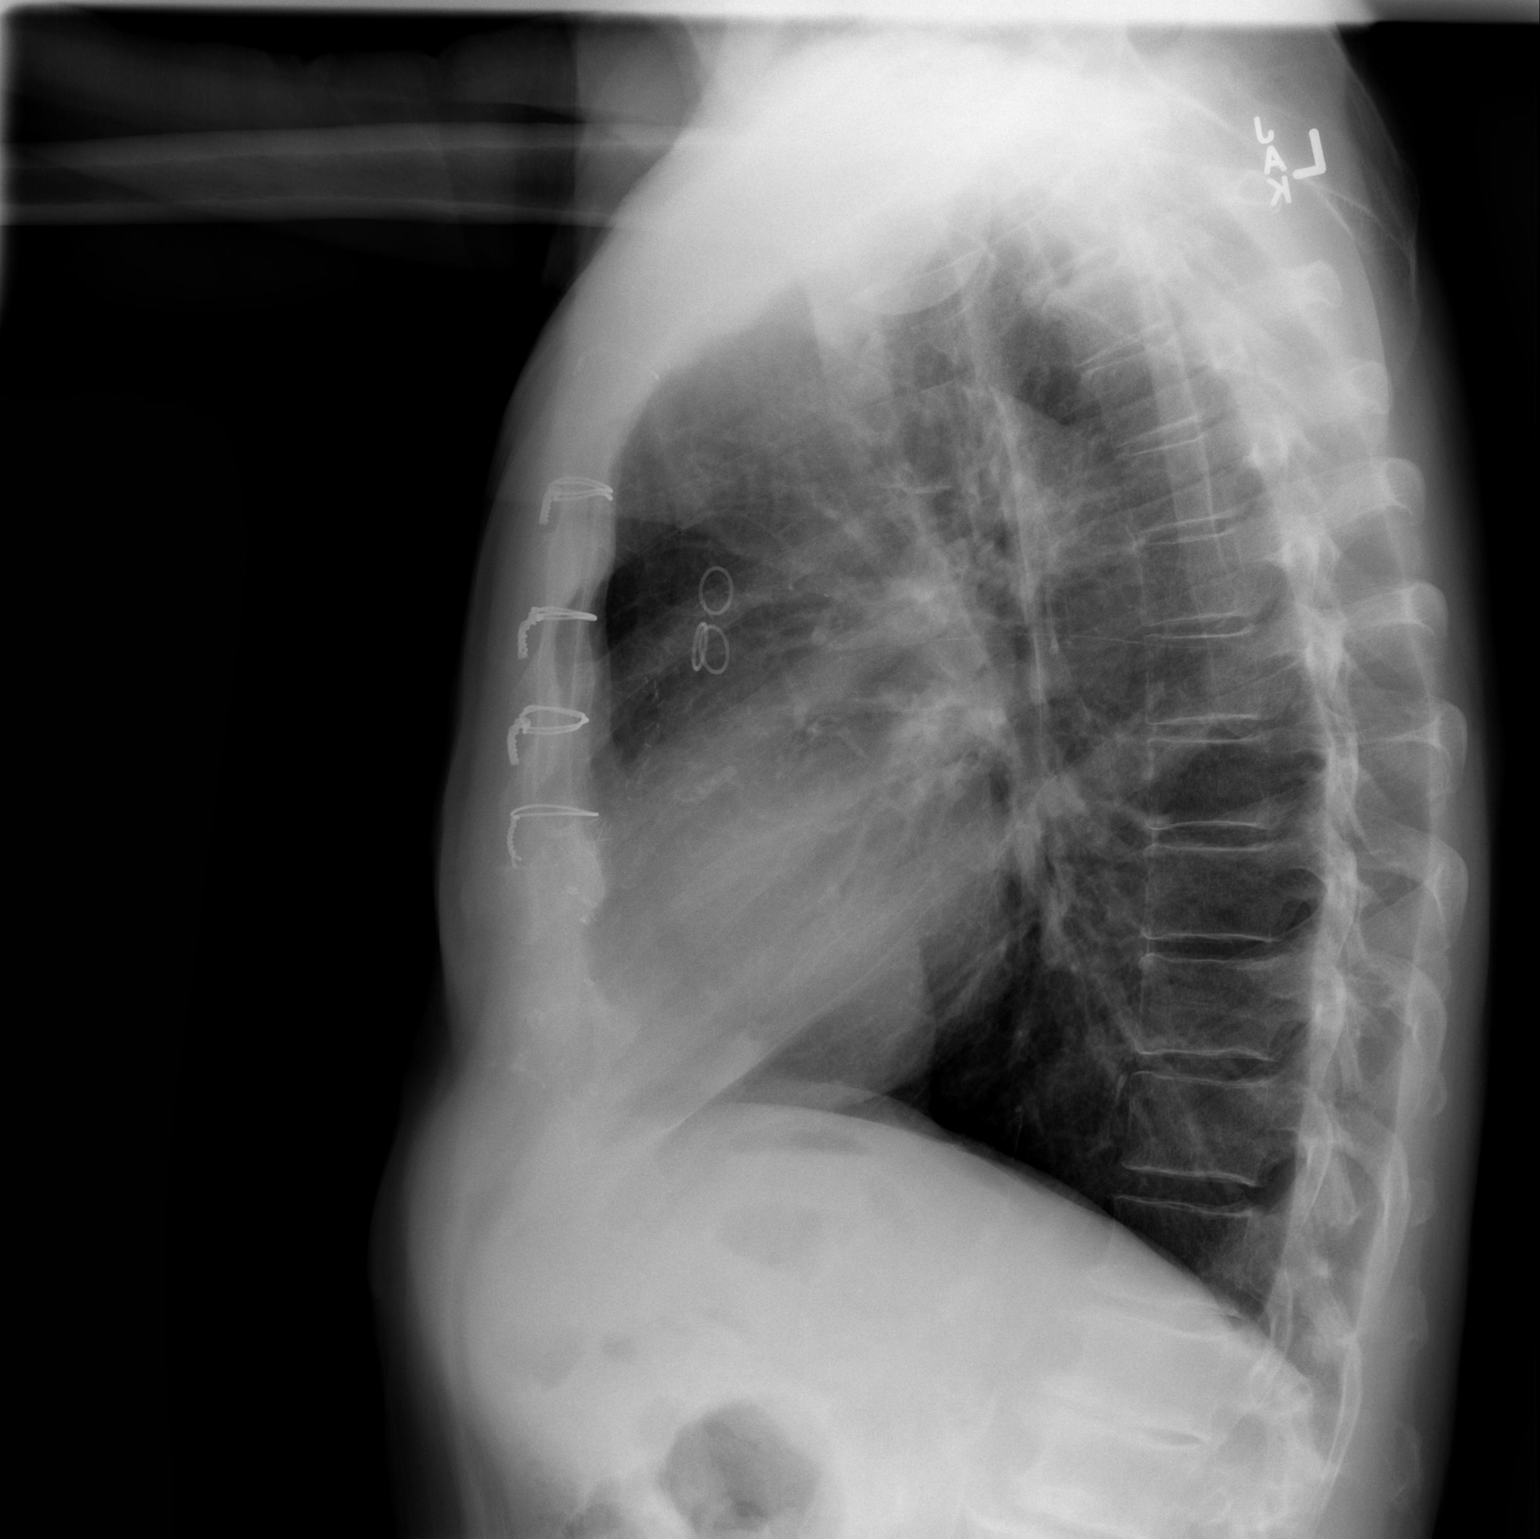

[2 of 2 positions shown; findings below may reference images not displayed]

FINDINGS: Upper normal heart size post CABG.

Atherosclerotic calcification aorta.

Mediastinal contours and pulmonary vascularity normal.

Lungs mildly hyperinflated but clear.

No pleural effusion or pneumothorax.

Bones unremarkable.
IMPRESSION: Post CABG.

No acute abnormalities.

## 2015-06-17 DIAGNOSIS — Z7984 Long term (current) use of oral hypoglycemic drugs: Secondary | ICD-10-CM | POA: Diagnosis not present

## 2015-06-17 DIAGNOSIS — E1142 Type 2 diabetes mellitus with diabetic polyneuropathy: Secondary | ICD-10-CM | POA: Diagnosis not present

## 2015-06-17 DIAGNOSIS — E1165 Type 2 diabetes mellitus with hyperglycemia: Secondary | ICD-10-CM | POA: Diagnosis not present

## 2015-06-20 ENCOUNTER — Ambulatory Visit (HOSPITAL_COMMUNITY)
Admission: RE | Admit: 2015-06-20 | Discharge: 2015-06-20 | Disposition: A | Discharge status: Home / Self Care | Payer: Commercial Managed Care - HMO | Source: Ambulatory Visit | Attending: Cardiology | Admitting: Cardiology

## 2015-06-20 DIAGNOSIS — I6523 Occlusion and stenosis of bilateral carotid arteries: Secondary | ICD-10-CM | POA: Insufficient documentation

## 2015-06-20 DIAGNOSIS — E1122 Type 2 diabetes mellitus with diabetic chronic kidney disease: Secondary | ICD-10-CM | POA: Insufficient documentation

## 2015-06-20 DIAGNOSIS — E785 Hyperlipidemia, unspecified: Secondary | ICD-10-CM | POA: Insufficient documentation

## 2015-06-20 DIAGNOSIS — I129 Hypertensive chronic kidney disease with stage 1 through stage 4 chronic kidney disease, or unspecified chronic kidney disease: Secondary | ICD-10-CM | POA: Diagnosis not present

## 2015-06-20 DIAGNOSIS — N183 Chronic kidney disease, stage 3 (moderate): Secondary | ICD-10-CM | POA: Diagnosis not present

## 2015-06-21 ENCOUNTER — Encounter: Payer: Self-pay | Admitting: Physician Assistant

## 2015-06-21 ENCOUNTER — Telehealth: Payer: Self-pay | Admitting: *Deleted

## 2015-06-21 NOTE — Telephone Encounter (Signed)
Lmptcb on both #'s to go over carotid results.

## 2015-06-21 NOTE — Telephone Encounter (Signed)
Pt's friend Levada Dy cb and asked if I could give her the carotid results. I advised since I did not see a DPR on file. I advised if pt was there he could give me permission. Pt came to the phone and gave me permission to s/w his friend Levada Dy about results. I will note on chart permission by phone until DPR form filled out. Pt notified of results.

## 2015-06-22 ENCOUNTER — Other Ambulatory Visit: Payer: Self-pay | Admitting: *Deleted

## 2015-06-22 DIAGNOSIS — I2510 Atherosclerotic heart disease of native coronary artery without angina pectoris: Secondary | ICD-10-CM

## 2015-07-04 ENCOUNTER — Other Ambulatory Visit: Payer: Self-pay | Admitting: Cardiovascular Disease

## 2015-11-29 DIAGNOSIS — E119 Type 2 diabetes mellitus without complications: Secondary | ICD-10-CM | POA: Diagnosis not present

## 2015-11-29 DIAGNOSIS — N400 Enlarged prostate without lower urinary tract symptoms: Secondary | ICD-10-CM | POA: Diagnosis not present

## 2015-11-30 DIAGNOSIS — L820 Inflamed seborrheic keratosis: Secondary | ICD-10-CM | POA: Diagnosis not present

## 2015-11-30 DIAGNOSIS — L814 Other melanin hyperpigmentation: Secondary | ICD-10-CM | POA: Diagnosis not present

## 2015-11-30 DIAGNOSIS — L570 Actinic keratosis: Secondary | ICD-10-CM | POA: Diagnosis not present

## 2015-11-30 DIAGNOSIS — L821 Other seborrheic keratosis: Secondary | ICD-10-CM | POA: Diagnosis not present

## 2015-11-30 DIAGNOSIS — D1801 Hemangioma of skin and subcutaneous tissue: Secondary | ICD-10-CM | POA: Diagnosis not present

## 2015-12-21 DIAGNOSIS — E1142 Type 2 diabetes mellitus with diabetic polyneuropathy: Secondary | ICD-10-CM | POA: Diagnosis not present

## 2015-12-21 DIAGNOSIS — I129 Hypertensive chronic kidney disease with stage 1 through stage 4 chronic kidney disease, or unspecified chronic kidney disease: Secondary | ICD-10-CM | POA: Diagnosis not present

## 2015-12-21 DIAGNOSIS — I2510 Atherosclerotic heart disease of native coronary artery without angina pectoris: Secondary | ICD-10-CM | POA: Diagnosis not present

## 2015-12-21 DIAGNOSIS — N183 Chronic kidney disease, stage 3 (moderate): Secondary | ICD-10-CM | POA: Diagnosis not present

## 2015-12-21 DIAGNOSIS — Z7984 Long term (current) use of oral hypoglycemic drugs: Secondary | ICD-10-CM | POA: Diagnosis not present

## 2016-02-16 DIAGNOSIS — H43391 Other vitreous opacities, right eye: Secondary | ICD-10-CM | POA: Insufficient documentation

## 2016-02-16 DIAGNOSIS — H2513 Age-related nuclear cataract, bilateral: Secondary | ICD-10-CM | POA: Diagnosis not present

## 2016-02-16 DIAGNOSIS — H43812 Vitreous degeneration, left eye: Secondary | ICD-10-CM | POA: Insufficient documentation

## 2016-02-16 DIAGNOSIS — E119 Type 2 diabetes mellitus without complications: Secondary | ICD-10-CM | POA: Diagnosis not present

## 2016-02-16 LAB — HM DIABETES EYE EXAM

## 2016-02-22 DIAGNOSIS — Z23 Encounter for immunization: Secondary | ICD-10-CM | POA: Diagnosis not present

## 2016-03-06 DIAGNOSIS — E1142 Type 2 diabetes mellitus with diabetic polyneuropathy: Secondary | ICD-10-CM | POA: Diagnosis not present

## 2016-06-11 DIAGNOSIS — E1142 Type 2 diabetes mellitus with diabetic polyneuropathy: Secondary | ICD-10-CM | POA: Diagnosis not present

## 2016-06-11 DIAGNOSIS — E875 Hyperkalemia: Secondary | ICD-10-CM | POA: Diagnosis not present

## 2016-06-19 DIAGNOSIS — E875 Hyperkalemia: Secondary | ICD-10-CM | POA: Diagnosis not present

## 2016-08-30 ENCOUNTER — Encounter: Payer: Self-pay | Admitting: Gastroenterology

## 2016-09-05 ENCOUNTER — Other Ambulatory Visit: Payer: Self-pay | Admitting: Cardiovascular Disease

## 2016-10-02 ENCOUNTER — Encounter: Payer: Self-pay | Admitting: Gastroenterology

## 2016-10-11 ENCOUNTER — Other Ambulatory Visit (HOSPITAL_COMMUNITY): Payer: Self-pay | Admitting: Family Medicine

## 2016-10-11 DIAGNOSIS — R0989 Other specified symptoms and signs involving the circulatory and respiratory systems: Secondary | ICD-10-CM

## 2016-10-17 ENCOUNTER — Ambulatory Visit (HOSPITAL_COMMUNITY)
Admission: RE | Admit: 2016-10-17 | Discharge: 2016-10-17 | Disposition: A | Discharge status: Home / Self Care | Payer: Medicare HMO | Source: Ambulatory Visit | Attending: Cardiovascular Disease | Admitting: Cardiovascular Disease

## 2016-10-17 DIAGNOSIS — I2510 Atherosclerotic heart disease of native coronary artery without angina pectoris: Secondary | ICD-10-CM | POA: Insufficient documentation

## 2016-10-17 DIAGNOSIS — I6523 Occlusion and stenosis of bilateral carotid arteries: Secondary | ICD-10-CM | POA: Diagnosis not present

## 2016-10-17 DIAGNOSIS — R0989 Other specified symptoms and signs involving the circulatory and respiratory systems: Secondary | ICD-10-CM

## 2016-10-17 DIAGNOSIS — E785 Hyperlipidemia, unspecified: Secondary | ICD-10-CM | POA: Diagnosis not present

## 2016-10-17 DIAGNOSIS — Z87891 Personal history of nicotine dependence: Secondary | ICD-10-CM | POA: Insufficient documentation

## 2016-10-17 DIAGNOSIS — E119 Type 2 diabetes mellitus without complications: Secondary | ICD-10-CM | POA: Insufficient documentation

## 2016-10-17 DIAGNOSIS — Z951 Presence of aortocoronary bypass graft: Secondary | ICD-10-CM | POA: Insufficient documentation

## 2016-10-17 DIAGNOSIS — I10 Essential (primary) hypertension: Secondary | ICD-10-CM | POA: Diagnosis not present

## 2016-11-15 ENCOUNTER — Other Ambulatory Visit: Payer: Self-pay

## 2016-11-15 MED ORDER — VALSARTAN 80 MG PO TABS: 80.0000 mg | ORAL_TABLET | Freq: Every day | ORAL | 0 refills | Status: DC

## 2016-11-26 DIAGNOSIS — N401 Enlarged prostate with lower urinary tract symptoms: Secondary | ICD-10-CM | POA: Diagnosis not present

## 2016-11-28 ENCOUNTER — Encounter: Payer: Self-pay | Admitting: Gastroenterology

## 2016-11-28 ENCOUNTER — Ambulatory Visit (AMBULATORY_SURGERY_CENTER): Payer: Self-pay

## 2016-11-28 VITALS — Ht 70.0 in | Wt 164.4 lb

## 2016-11-28 DIAGNOSIS — Z86010 Personal history of colonic polyps: Secondary | ICD-10-CM

## 2016-11-28 MED ORDER — PEG-KCL-NACL-NASULF-NA ASC-C 100 G PO SOLR
1.0000 | Freq: Once | ORAL | 0 refills | Status: AC
Start: 2016-11-28 — End: 2016-11-28

## 2016-11-28 NOTE — Progress Notes (Signed)
Denies allergies to eggs or soy products. Denies complication of anesthesia or sedation. Denies use of weight loss medication. Denies use of O2.   Emmi instructions declined. Patient does not use computers.

## 2016-12-03 DIAGNOSIS — E1142 Type 2 diabetes mellitus with diabetic polyneuropathy: Secondary | ICD-10-CM | POA: Diagnosis not present

## 2016-12-03 DIAGNOSIS — Z0000 Encounter for general adult medical examination without abnormal findings: Secondary | ICD-10-CM | POA: Diagnosis not present

## 2016-12-03 DIAGNOSIS — I129 Hypertensive chronic kidney disease with stage 1 through stage 4 chronic kidney disease, or unspecified chronic kidney disease: Secondary | ICD-10-CM | POA: Diagnosis not present

## 2016-12-03 DIAGNOSIS — Z7984 Long term (current) use of oral hypoglycemic drugs: Secondary | ICD-10-CM | POA: Diagnosis not present

## 2016-12-03 DIAGNOSIS — Z23 Encounter for immunization: Secondary | ICD-10-CM | POA: Diagnosis not present

## 2016-12-03 DIAGNOSIS — I2510 Atherosclerotic heart disease of native coronary artery without angina pectoris: Secondary | ICD-10-CM | POA: Diagnosis not present

## 2016-12-03 LAB — HEMOGLOBIN A1C: Hemoglobin A1C: 9.7

## 2016-12-10 ENCOUNTER — Encounter: Payer: Self-pay | Admitting: Gastroenterology

## 2016-12-10 ENCOUNTER — Ambulatory Visit (AMBULATORY_SURGERY_CENTER): Payer: Medicare HMO | Admitting: Gastroenterology

## 2016-12-10 VITALS — BP 147/50 | HR 42 | Temp 97.1°F | Resp 15 | Ht 70.0 in | Wt 169.0 lb

## 2016-12-10 DIAGNOSIS — Z1211 Encounter for screening for malignant neoplasm of colon: Secondary | ICD-10-CM | POA: Diagnosis not present

## 2016-12-10 DIAGNOSIS — Z86010 Personal history of colonic polyps: Secondary | ICD-10-CM | POA: Diagnosis present

## 2016-12-10 MED ORDER — SODIUM CHLORIDE 0.9 % IV SOLN: 500.0000 mL | INTRAVENOUS | Status: DC

## 2016-12-10 NOTE — Op Note (Signed)
[facility] Patient Name: Justin Salinas Procedure Date: 12/10/2016 8:07 AM MRN: [id number] Endoscopist: Mallie Mussel L. Loletha Carrow , MD Age: 76 Referring MD:  Date of Birth: 1940/12/14 Gender: Male Account #: [id number] Procedure:                Colonoscopy Indications:              Surveillance: Personal history of adenomatous                            polyps on last colonoscopy 3 years ago Medicines:                Monitored Anesthesia Care Procedure:                Pre-Anesthesia Assessment:                           - Prior to the procedure, a History and Physical                            was performed, and patient medications and                            allergies were reviewed. The patient's tolerance of                            previous anesthesia was also reviewed. The risks                            and benefits of the procedure and the sedation                            options and risks were discussed with the patient.                            All questions were answered, and informed consent                            was obtained. Prior Anticoagulants: The patient has                            taken no previous anticoagulant or antiplatelet                            agents. ASA Grade Assessment: III - A patient with                            severe systemic disease. After reviewing the risks                            and benefits, the patient was deemed in                            satisfactory condition to undergo the procedure.  After obtaining informed consent, the colonoscope                            was passed under direct vision. Throughout the                            procedure, the patient's blood pressure, pulse, and                            oxygen saturations were monitored continuously. The                            Colonoscope was introduced through the anus and                            advanced to the the  cecum, identified by                            appendiceal orifice and ileocecal valve. The                            colonoscopy was performed without difficulty. The                            patient tolerated the procedure well. The quality                            of the bowel preparation was fair. The bowel                            preparation used was SUPREP. The ileocecal valve,                            appendiceal orifice, and rectum were photographed. Scope In: 8:21:12 AM Scope Out: 8:39:19 AM Scope Withdrawal Time: 0 hours 8 minutes 19 seconds  Total Procedure Duration: 0 hours 18 minutes 7 seconds  Findings:                 The perianal and digital rectal examinations were                            normal.                           The sigmoid colon was moderately redundant.                           The exam was otherwise without abnormality on                            direct and retroflexion views. Complications:            No immediate complications. Estimated Blood Loss:     Estimated blood loss: none. Impression:               - Preparation of the colon  was fair.                           - Redundant colon.                           - The examination was otherwise normal on direct                            and retroflexion views.                           - No specimens collected. Recommendation:           - Patient has a contact number available for                            emergencies. The signs and symptoms of potential                            delayed complications were discussed with the                            patient. Return to normal activities tomorrow.                            Written discharge instructions were provided to the                            patient.                           - Resume previous diet.                           - Continue present medications.                           - No repeat routine colonoscopy due to  age. [name] L. Loletha Carrow, MD 12/10/2016 8:43:48 AM This report has been signed electronically.

## 2016-12-10 NOTE — Progress Notes (Signed)
To PACU, vss patent aw report to rn 

## 2016-12-10 NOTE — Patient Instructions (Signed)
YOU HAD AN ENDOSCOPIC PROCEDURE TODAY AT [facility]:   Refer to the procedure report that was given to you for any specific questions about what was found during the examination.  If the procedure report does not answer your questions, please call your gastroenterologist to clarify.  If you requested that your care partner not be given the details of your procedure findings, then the procedure report has been included in a sealed envelope for you to review at your convenience later.  YOU SHOULD EXPECT: Some feelings of bloating in the abdomen. Passage of more gas than usual.  Walking can help get rid of the air that was put into your GI tract during the procedure and reduce the bloating. If you had a lower endoscopy (such as a colonoscopy or flexible sigmoidoscopy) you may notice spotting of blood in your stool or on the toilet paper. If you underwent a bowel prep for your procedure, you may not have a normal bowel movement for a few days.  Please Note:  You might notice some irritation and congestion in your nose or some drainage.  This is from the oxygen used during your procedure.  There is no need for concern and it should clear up in a day or so.  SYMPTOMS TO REPORT IMMEDIATELY:   Following lower endoscopy (colonoscopy or flexible sigmoidoscopy):  Excessive amounts of blood in the stool  Significant tenderness or worsening of abdominal pains  Swelling of the abdomen that is new, acute  Fever of 100F or higher   For urgent or emergent issues, a gastroenterologist can be reached at any hour by calling [phone].   DIET:  We do recommend a small meal at first, but then you may proceed to your regular diet.  Drink plenty of fluids but you should avoid alcoholic beverages for 24 hours.  ACTIVITY:  You should plan to take it easy for the rest of today and you should NOT DRIVE or use heavy machinery until tomorrow (because of the sedation medicines used during the test).     FOLLOW UP: Our staff will call the number listed on your records the next business day following your procedure to check on you and address any questions or concerns that you may have regarding the information given to you following your procedure. If we do not reach you, we will leave a message.  However, if you are feeling well and you are not experiencing any problems, there is no need to return our call.  We will assume that you have returned to your regular daily activities without incident.  If any biopsies were taken you will be contacted by phone or by letter within the next 1-3 weeks.  Please call us at [phone] if you have not heard about the biopsies in 3 weeks.   No more Colonoscopy screening needed   SIGNATURES/CONFIDENTIALITY: You and/or your care partner have signed paperwork which will be entered into your electronic medical record.  These signatures attest to the fact that that the information above on your After Visit Summary has been reviewed and is understood.  Full responsibility of the confidentiality of this discharge information lies with you and/or your care-partner.

## 2016-12-11 ENCOUNTER — Telehealth: Payer: Self-pay

## 2016-12-11 NOTE — Telephone Encounter (Signed)
  Follow up Call-  Call back number 12/10/2016  Post procedure Call Back phone  # [phone]  Permission to leave phone message No  Some recent data might be hidden     Patient questions:  Do you have a fever, pain , or abdominal swelling? No. Pain Score  0 *  Have you tolerated food without any problems? Yes.    Have you been able to return to your normal activities? Yes.    Do you have any questions about your discharge instructions: Diet   No. Medications  No. Follow up visit  No.  Do you have questions or concerns about your Care? No.  Actions: * If pain score is 4 or above: No action needed, pain <4.

## 2017-01-03 ENCOUNTER — Encounter: Payer: Self-pay | Admitting: Endocrinology

## 2017-01-03 ENCOUNTER — Ambulatory Visit (INDEPENDENT_AMBULATORY_CARE_PROVIDER_SITE_OTHER): Payer: Medicare HMO | Admitting: Endocrinology

## 2017-01-03 ENCOUNTER — Other Ambulatory Visit: Payer: Self-pay

## 2017-01-03 VITALS — BP 128/64 | HR 71 | Ht 70.0 in | Wt 153.0 lb

## 2017-01-03 DIAGNOSIS — E1165 Type 2 diabetes mellitus with hyperglycemia: Secondary | ICD-10-CM | POA: Insufficient documentation

## 2017-01-03 NOTE — Progress Notes (Signed)
Patient ID: Justin Salinas, male   DOB: [birth year], 76 y.o.   MRN: [id number]           Reason for Appointment: Consultation for Type 2 Diabetes  Referring healthcare provider: Corine Shelter, PA   History of Present Illness:          Date of diagnosis of type 2 diabetes mellitus:2000        Background history:   His diabetes had been mild initially and treated with metformin He had subsequently other medications added including Amaryl and Januvia No details are available from PCP office, [name] in 2015 was 7.6  Recent history:   Non-insulin hypoglycemic drugs the patient is taking are: Metformin 1 g twice a day, Precose 50 mg before meals 3 times a day, Amaryl 4 mg twice a day, Actos 15 mg daily, Januvia 50 mg daily  He has been referred for management of poor control with A1c 9.7 about a month ago  Current management, blood sugar patterns and problems identified:  Because of his poor control he has had multiple medications prescribed, last month was prescribed Actos in addition  He thinks he has had higher blood sugars for the last 3-4 months.  Previously he would have blood sugars in the 160+ range in the mornings and now they are mostly over 200  Since about December he has had more personal stress with loss of his companion  He checks his blood sugars with a true Metrix monitor twice a day before breakfast and suppertime  Blood sugars are generally higher in the afternoon compared to the morning  He does tend to get tired more easily and also has had some increased thirst and urination  He usually drinks water and some milk when he is thirsty and avoids drinks with sugar  He has lost at least 10 pounds over the last few weeks, in the past his maximum weight has been 208        Side effects from medications have been: None  Compliance with the medical regimen: Good  Glucose monitoring:  done 2 times a day         Glucometer: True Metrix .      Blood Glucose  readings by  recall:  PREMEAL Breakfast Lunch Dinner Bedtime  Overall   Glucose range: 200  330    Median:        Self-care: The diet that the patient has been following is: tries to limit sweets, fried food       Typical meal intake: Breakfast is eggs meat, toast.                Dietician visit, most recent: Never               Exercise:  walking or other exercise at the gym 3-4/7 days for at least 30 min   Weight history:   Wt Readings from Last 3 Encounters:  01/03/17 153 lb (69.4 kg)  12/10/16 169 lb (76.7 kg)  11/28/16 164 lb 6.4 oz (74.6 kg)    Glycemic control:   Lab Results  Component Value Date   HGBA1C 9.7 12/03/2016   HGBA1C 7.6 (H) 05/19/2014   HGBA1C 8.1 (H) 03/15/2008   Lab Results  Component Value Date   MICROALBUR 0.2 12/01/2007   CREATININE 1.30 (H) 04/13/2015   Lab Results  Component Value Date   MICRALBCREAT 1.8 12/01/2007   Microalbumin ratio 9 in 12/2015  No results found for: FRUCTOSAMINE  Allergies as of 01/03/2017      Reactions   Lasix [furosemide] Other (See Comments)   Constipation      Medication List       Accurate as of 01/03/17  8:47 PM. Always use your most recent med list.          acarbose 50 MG tablet Commonly known as:  PRECOSE Take 50 mg by mouth 3 (three) times daily with meals.   ACCU-CHEK AVIVA PLUS test strip Generic drug:  glucose blood   ACCU-CHEK SOFTCLIX LANCET DEV Kit   aspirin 81 MG tablet Take 81 mg by mouth daily.   CENTRUM SILVER ADULT 50+ PO Take by mouth.   glimepiride 4 MG tablet Commonly known as:  AMARYL Take 4 mg by mouth 2 (two) times daily.   metFORMIN 1000 MG tablet Commonly known as:  GLUCOPHAGE Take 1,000 mg by mouth 2 (two) times daily with a meal.   pioglitazone 15 MG tablet Commonly known as:  ACTOS Take 15 mg by mouth daily.   simvastatin 40 MG tablet Commonly known as:  ZOCOR Take 1 tablet (40 mg total) by mouth every evening.   sitaGLIPtin 50 MG tablet Commonly  known as:  JANUVIA Take 50 mg by mouth daily.   torsemide 20 MG tablet Commonly known as:  DEMADEX Take 20 mg by mouth daily. Take 1/2 tablet daily   valsartan 80 MG tablet Commonly known as:  DIOVAN Take 1 tablet (80 mg total) by mouth daily.       Allergies:  Allergies  Allergen Reactions  . Lasix [Furosemide] Other (See Comments)    Constipation    Past Medical History:  Diagnosis Date  . Allergy   . Anemia   . Asthma   . BPH (benign prostatic hyperplasia)   . CAD (coronary artery disease)    a. s/p MI 09/1993;  b. known CTO of RCA;  c. 06/2014 Cath: LM short/mod dzs, LAD 95ost, LCX nl, RI nl, RCA 100, EF 55%;  c. 06/2014 s/p CABG x 4: LIMA->LAD, VG->D1, VG->OM1, VG->Acute Marginal.  . Carotid stenosis    a. Carotid US 9/32:  RICA 35-57%; LICA 32-20% >> FU 1 year  . CKD (chronic kidney disease), stage III   . Constipation    in the past 2-3 weeks  . Diabetes ([place])    AODM  . Diastolic dysfunction    a. 05/2015 Echo: EF 60-65%, LVH, Gr 1 DD, mild MR, mod dil RA/LA, mod TR, PASP 69mHg.  . Essential hypertension   . GERD (gastroesophageal reflux disease)   . Hyperlipidemia     Past Surgical History:  Procedure Laterality Date  . ANGIOPLASTY  09/22/1993   POBA of RCA (Dr. RMarella Chimes  . BACK SURGERY  2000   back fusion - Dr. HEarnie Larsson . CARDIAC CATHETERIZATION  11/28/1998   "silent occlusion" of RCA w/collaterals from left coronary system, prox LAD w/40-50% eccentric narrowing, 1st diagonal with 70-80% eccentric narrowing, 85% narrowing of prox small OM1 (Dr. RMarella Chimes  . CAROTID DOPPLER  07/2011   left subclavian (50-69%); right bulb (0-49%); RICA (normal); left mid-distal CCA (0-49%); left bulb/prox ICA (50-69%); left vertebral with abnormal antegrade flow  . CORONARY ARTERY BYPASS GRAFT N/A 05/21/2014   Procedure: CORONARY ARTERY BYPASS GRAFTING (CABG) x  four, using left internal mammary artery and right leg greater saphenous vein harvested endoscopically;   Surgeon: SMelrose Nakayama MD;  [facility]  Service: Open Heart Surgery;  Laterality: N/A;  .  INTRAOPERATIVE TRANSESOPHAGEAL ECHOCARDIOGRAM N/A 05/21/2014   Procedure: INTRAOPERATIVE TRANSESOPHAGEAL ECHOCARDIOGRAM;  Surgeon: Melrose Nakayama, MD;  Location: [facility];  Service: Open Heart Surgery;  Laterality: N/A;  . LEFT HEART CATHETERIZATION WITH CORONARY ANGIOGRAM N/A 05/19/2014   Procedure: LEFT HEART CATHETERIZATION WITH CORONARY ANGIOGRAM;  Surgeon: Blane Ohara, MD;  Location[facility];  Service: Cardiovascular;  Laterality: N/A;  . NASAL SINUS SURGERY  2010  . NM MYOCAR PERF WALL MOTION  09/19/2009   bruce myoview - mild perfusion defect in basal inferior region (infarct/scar), EF 60%, low risk scan  . RENAL DOPPLER  10/2011   SMA w/ 70-99% diameter reduction & high grade stenosis; R&L renals w/narrowing and increased velocities (60-99%), R kidney smaller than L  . TRANSTHORACIC ECHOCARDIOGRAM  10/20/2012   EF 55-60%, moderate concentric hypertrophy, ventricular septum thickness increased, calcified MV annulus  . VASECTOMY  1963    Family History  Problem Relation Age of Onset  . Uterine cancer Mother   . Stroke Father   . Diabetes Sister   . Colon polyps Brother   . Heart disease Brother   . Colon cancer Neg Hx   . Esophageal cancer Neg Hx   . Rectal cancer Neg Hx   . Stomach cancer Neg Hx     Social History:  reports that he quit smoking about 44 years ago. His smoking use included Cigarettes. His smokeless tobacco use includes Snuff. He reports that he drinks about 1.8 oz of alcohol per week . He reports that he does not use drugs.   Review of Systems  Constitutional: Positive for weight loss. Negative for reduced appetite.  HENT: Negative for trouble swallowing.   Respiratory: Negative for shortness of breath.   Cardiovascular: Negative for leg swelling and claudication.  Gastrointestinal: Positive for constipation.  Endocrine: Positive for fatigue and  polydipsia. Negative for light-headedness.  Musculoskeletal: Negative for joint pain.  Skin: Negative for itching.  Neurological: Positive for balance difficulty. Negative for numbness and tingling.     Lipid history: Most recent LDL 62 in July   No results found for: CHOL, HDL, LDLCALC, LDLDIRECT, TRIG, CHOLHDL         Hypertension: off valsartan for the last 2 weeks, he is waiting for his cardiology appointment  Most recent eye exam was 9/17  Most recent foot exam: 8/18    LABS:  Abstract on 01/03/2017  Component Date Value Ref Range Status  . Hemoglobin A1C 12/03/2016 9.7   Final    Physical Examination:  BP 128/64   Pulse 71   Ht _0  (1.778 m)   Wt 153 lb (69.4 kg)   SpO2 98%   BMI 21.95 kg/m   GENERAL:     He is averagely built and nourished, mildly asthenic HEENT:         Eye exam shows normal external appearance.  Fundus exam shows no retinopathy. Oral exam shows normal mucosa .  NECK:   There is no lymphadenopathy Thyroid is not enlarged and no nodules felt.  Carotids are normal to palpation and no bruit heard LUNGS:         Chest is symmetrical. Lungs are clear to auscultation.Marland Kitchen   HEART:         Heart sounds:  S1 and S2 are normal. No murmur or click heard., no S3 or S4.   ABDOMEN:   There is no distention present.  Liver and spleen are not palpable. No other mass or tenderness present.  NEUROLOGICAL:   Ankle jerks are absent bilaterally. biceps reflexes are 1+ bilaterally     Diabetic Foot Exam - Simple   Simple Foot Form Diabetic Foot exam was performed with the following findings:  Yes   Visual Inspection No deformities, no ulcerations, no other skin breakdown bilaterally:  Yes Sensation Testing Intact to touch and monofilament testing bilaterally:  Yes Pulse Check Posterior Tibialis and Dorsalis pulse intact bilaterally:  Yes Comments            Vibration sense is Moderately reduced in distal first toes. MUSCULOSKELETAL:  There is no  swelling or deformity of the peripheral joints. Spine is normal to inspection.   EXTREMITIES:     There is no edema. No skin lesions present.Marland Kitchen SKIN:       No rash or lesions of concern.        ASSESSMENT:  Diabetes type 2, uncontrolled with A1c 9.7 recently, current BMI about 22    See history of present illness for detailed discussion of current diabetes management, blood sugar patterns and problems identified  Patient is symptomatic with hyperglycemia which is significant over the last few months and also causing weight loss He has been on multiple medications and reporting blood sugars as high as 330 at home with mostly postprandial hyperglycemia but also has fasting readings over 200 Discussed with the patient that he is appearing to be insulin deficient especially with his long history of diabetes He is generally doing fairly well with his lifestyle as far as diet and exercise regimen  Complications of diabetes: Mild peripheral neuropathy, symptomatic although no sensory loss on exam, urine microalbumin not available recently  Mild hypertension: Blood pressure is currently normal without any medications and can continue to observe  PLAN:     Start insulin.  For simplicity and convenience he will start with premixed insulin for breakfast and suppertime  Currently NOVOLOG MIX is covered by insurance  Injection technique with insulin pen was described to the patient in detail and he practiced this on the foam pad in the office.  Discussed timing of injection, actions of premixed insulin  Also given instructions specifically on how to adjust the doses every 3-4 days  Discussed potential for hypoglycemia and management of low blood sugar  He will start with 10 units at breakfast and 6 units at suppertime  Since he is not benefiting from other diabetes medications he can stop Januvia, Amaryl and acarbose while continuing metformin and Actos.   given new Accu-Chek  Guide meter and  explained how to use this instead of the generic He needs to check sugars at least once a day after one of his meals especially after supper  Patient Instructions  Stop acarbose, glimepiride and Januvia Reduce the metformin to half tablet in the morning  NOVOLOG mix insulin: Start taking 10 units before breakfast and 6 units before supper Take this right before eating  After every 3-4 days adjust the insulin doses as follows:  If the blood sugar at breakfast time is over 150 increase the supper time dose by 1 unit  If the blood sugars before suppertime are over 150 increase the morning dose by 1 unit  Do not increase insulin if the blood sugar at any point is below 100   Counseling time on subjects discussed in assessment and plan sections is over 50% of today's 60 minute visit   Consultation note has been sent to the referring physician  [facility] 01/03/2017, 8:47 PM  Note: This office note was prepared with Dragon voice recognition system technology. Any transcriptional errors that result from this process are unintentional.  

## 2017-01-03 NOTE — Patient Instructions (Addendum)
Stop acarbose, glimepiride and Januvia Reduce the metformin to half tablet in the morning  NOVOLOG mix insulin: Start taking 10 units before breakfast and 6 units before supper Take this right before eating  After every 3-4 days adjust the insulin doses as follows:  If the blood sugar at breakfast time is over 150 increase the supper time dose by 1 unit  If the blood sugars before suppertime are over 150 increase the morning dose by 1 unit  Do not increase insulin if the blood sugar at any point is below 100

## 2017-01-04 ENCOUNTER — Telehealth: Payer: Self-pay

## 2017-01-04 ENCOUNTER — Telehealth: Payer: Self-pay | Admitting: Endocrinology

## 2017-01-04 MED ORDER — PEN NEEDLES 32G X 4 MM MISC: 16.0000 [IU] | Freq: Two times a day (BID) | 0 refills | Status: DC

## 2017-01-04 MED ORDER — PEN NEEDLES 31G X 5 MM MISC: 1.0000 | Freq: Two times a day (BID) | 1 refills | Status: DC

## 2017-01-04 MED ORDER — GLUCOSE BLOOD VI STRP: ORAL_STRIP | 0 refills | Status: DC

## 2017-01-04 MED ORDER — INSULIN ASPART PROT & ASPART (70-30 MIX) 100 UNIT/ML PEN: 10.0000 [IU] | PEN_INJECTOR | Freq: Two times a day (BID) | SUBCUTANEOUS | 1 refills | Status: DC

## 2017-01-04 NOTE — Telephone Encounter (Signed)
Routing to you °

## 2017-01-04 NOTE — Telephone Encounter (Signed)
Done

## 2017-01-04 NOTE — Telephone Encounter (Signed)
He needs prescription sent for pen needles, 31-gauge, 5 mm.  This was supposed to be sent yesterday.  Also his pharmacy has not been entered which [facility] [address]

## 2017-01-04 NOTE — Telephone Encounter (Signed)
Patient Name: Justin Salinas Gender: Male DOB: 12/29/1940 Age: 76 Y 51 M Return Phone Number: [phone] (Primary) City/State/Zip: [place] Client [place] [facility] [place] Client Site [place] [facility] [place] Who [facility] [place] Call Type Pharmacy Send to RN Chief Complaint Unknown Complaint Reason for Call Request to speak to Physician Initial Comment [facility] that [facility] and [facility] not [facility] a Rx for a patient. Dr. Dwyane Dee Additional Comment [facility] Pharmacist Name [facility] [facility] Number [phone] Nurse Assessment Nurse: Margaretmary Dys, RN, Hettie Date/Time Eilene Ghazi Time): 01/03/2017 6:56:42 PM Please select the assessment type ---Pharmacy clarification Additional Documentation ---[facility]er states that [facility] and they did not [facility] a Rx for a patient. Dr. Dwyane Dee Is there an on-call physician for the client? ---No Guidelines Guideline Title [place]. Time Eilene Ghazi Time) Disposition Final User 01/03/2017 7:02:00 PM Clinical Call Yes Margaretmary Dys, RN, Hettie

## 2017-01-04 NOTE — Telephone Encounter (Signed)
Checking on status of insulin. Please advise.  Thank you,  -LL

## 2017-01-04 NOTE — Telephone Encounter (Signed)
Patient's daughter called in reference to patient not having Rx for Insulin that he was supposed to start this morning. [facility] [address], [place], [place] [address].   Please call patient's daughter at number provided and advise. OK to leave message.

## 2017-01-07 ENCOUNTER — Other Ambulatory Visit: Payer: Self-pay

## 2017-01-07 ENCOUNTER — Telehealth: Payer: Self-pay | Admitting: Endocrinology

## 2017-01-07 MED ORDER — INSULIN ASPART PROT & ASPART (70-30 MIX) 100 UNIT/ML PEN: PEN_INJECTOR | SUBCUTANEOUS | 1 refills | Status: DC

## 2017-01-07 MED ORDER — PEN NEEDLES 31G X 5 MM MISC: 1.0000 | Freq: Two times a day (BID) | 1 refills | Status: DC

## 2017-01-07 NOTE — Telephone Encounter (Signed)
Called patient and spoke to patients daughter and let her know that I have sent in a new prescription for the Novolog 70/30 and have applied the correct dosage from Dr. Ronnie Derby encounter. The original prescription had the incorrect dosage and also did not have the diagnosis code for Medicare. I let her know to please continue monitoring blood sugars so we can adjust his medication on his next office visit and to call us if she needs anything.

## 2017-01-07 NOTE — Telephone Encounter (Signed)
Justin Salinas here is what is needed for this pt. Thank you

## 2017-01-07 NOTE — Telephone Encounter (Signed)
Pharmacy called needing Rx for insulin aspart protamine - aspart (NOVOLOG MIX 70/30 FLEXPEN) (70-30) 100 UNIT/ML FlexPen resent with diagnosis code.

## 2017-01-07 NOTE — Telephone Encounter (Signed)
Pt's daughter calling to see if we can resend the rx to the pharmacy it is lacking the diagnosis code. Thank you!

## 2017-01-07 NOTE — Telephone Encounter (Signed)
Resubmitted the prescription for novolog flexpen 70/30 and diagnosis code E11.65 was associated on pat. sig

## 2017-01-07 NOTE — Telephone Encounter (Signed)
This has already been sent two times to the pharmacy this morning.

## 2017-01-08 NOTE — Telephone Encounter (Signed)
Completed 01/07/17.

## 2017-01-10 ENCOUNTER — Other Ambulatory Visit: Payer: Self-pay

## 2017-01-11 ENCOUNTER — Telehealth: Payer: Self-pay | Admitting: Endocrinology

## 2017-01-11 ENCOUNTER — Other Ambulatory Visit: Payer: Self-pay

## 2017-01-11 MED ORDER — GLUCOSE BLOOD VI STRP: ORAL_STRIP | 0 refills | Status: DC

## 2017-01-11 NOTE — Telephone Encounter (Signed)
There is no reason to change his scheduled appointment, assuming he is taking his insulin

## 2017-01-11 NOTE — Telephone Encounter (Signed)
Patient called in reference to receiving a text message message stating to call the office. Patient also stated he needs test strips but was not sure if he needed to be seen by Dwyane Dee first. Please call patient and advise. OK to leave message.

## 2017-01-11 NOTE — Telephone Encounter (Signed)
Routing to you °

## 2017-01-11 NOTE — Telephone Encounter (Signed)
Called patient and let him know that I have sent over his Accu-Chek Guide strips to [facility] [address] for him and he is going to use his finger pricker from his previous meter for now.   Please advise if you want him to come back on 01/15/2017. I see this scheduled and just want to confirm if we are keeping this or when he needs to come back to see [place].

## 2017-01-11 NOTE — Telephone Encounter (Signed)
Called patient and let him know to keep appointment on Tuesday. He stated he will see [place] then and stated his blood sugars are doing much better and that he feels better also.

## 2017-01-14 ENCOUNTER — Other Ambulatory Visit: Payer: Self-pay

## 2017-01-14 MED ORDER — GLUCOSE BLOOD VI STRP: ORAL_STRIP | 2 refills | Status: DC

## 2017-01-15 ENCOUNTER — Ambulatory Visit (INDEPENDENT_AMBULATORY_CARE_PROVIDER_SITE_OTHER): Payer: Medicare HMO | Admitting: Endocrinology

## 2017-01-15 ENCOUNTER — Encounter: Payer: Self-pay | Admitting: Endocrinology

## 2017-01-15 VITALS — BP 150/78 | HR 64 | Ht 70.0 in | Wt 152.4 lb

## 2017-01-15 DIAGNOSIS — E1165 Type 2 diabetes mellitus with hyperglycemia: Secondary | ICD-10-CM

## 2017-01-15 NOTE — Patient Instructions (Addendum)
More sugars 2-3 hrs after various meal  Am insulin 14 units  Check sugar before exercise

## 2017-01-15 NOTE — Progress Notes (Signed)
Patient ID: Justin Salinas, male   DOB: 03/25/1941, 76 y.o.   MRN: [id number]           Reason for Appointment: for Type 2 Diabetes  Referring healthcare provider: Mark Hepler, PA   History of Present Illness:          Date of diagnosis of type 2 diabetes mellitus:2000        Background history:   His diabetes had been mild initially and treated with metformin He had subsequently other medications added including Amaryl and Januvia No details are available from PCP office, [name] in 2015 was 7.6  Recent history:   Insulin regimen: NovoLog mix, 10 units at breakfast and 6 at supper  Non-insulin hypoglycemic drugs the patient is taking are: Metformin 1 g twice a day, Actos 15 mg daily   He has been referred for management of poor control with A1c 9.7 about a month ago  Current management, blood sugar patterns and problems identified:  Because of his poor control he has been started on insulin on 01/03/17  Previously was having blood sugars at least over 200 and averaging about 330 at suppertime  He was started on monitoring with an Accu-Chek meter  He has no difficulty with doing his insulin injections as instructed  He may sometimes take his suppertime dose 30 min before eating but usually taking it before breakfast in the morning  He feels significantly better subjectively  His blood sugars are still relatively high at suppertime although not consistent  Has not done any readings after meals  However fasting blood sugars are mostly improved  He likes to exercise although has not done much recently, did do some physical work yesterday midday without any problems        Side effects from medications have been: None  Compliance with the medical regimen: Good  Glucose monitoring:  done 2 times a day         Glucometer:  Accu-Chek guide .      Blood Glucose readings by download:   Mean values apply above for all meters except median for One Touch  PRE-MEAL  Fasting Lunch Dinner Bedtime Overall  Glucose range: 100-192   134-350     Mean/median: 140   259   198    Self-care: The diet that the patient has been following is: tries to limit sweets, fried food       Typical meal intake: Breakfast is eggs meat, toast.                Dietician visit, most recent: Never               Exercise: None recently, previously walking or other exercise at the gym 3-4/7 days for at least 30 min   Weight history:   Wt Readings from Last 3 Encounters:  01/15/17 152 lb 6.4 oz (69.1 kg)  01/03/17 153 lb (69.4 kg)  12/10/16 169 lb (76.7 kg)    Glycemic control:   Lab Results  Component Value Date   HGBA1C 9.7 12/03/2016   HGBA1C 7.6 (H) 05/19/2014   HGBA1C 8.1 (H) 03/15/2008   Lab Results  Component Value Date   MICROALBUR 0.2 12/01/2007   CREATININE 1.30 (H) 04/13/2015   Lab Results  Component Value Date   MICRALBCREAT 1.8 12/01/2007   Microalbumin ratio 9 in 12/2015  No results found for: FRUCTOSAMINE    Allergies as of 01/15/2017      Reactions     Lasix [furosemide] Other (See Comments)   Constipation      Medication List       Accurate as of 01/15/17 10:25 AM. Always use your most recent med list.          ACCU-CHEK SOFTCLIX LANCET DEV Kit   aspirin 81 MG tablet Take 81 mg by mouth daily.   CENTRUM SILVER ADULT 50+ PO Take by mouth.   glucose blood test strip Commonly known as:  ACCU-CHEK GUIDE Use to check blood sugars 2 times daily   glucose blood test strip Commonly known as:  ACCU-CHEK AVIVA PLUS Check blood glucose twice daily Dx code E11.65   insulin aspart protamine - aspart (70-30) 100 UNIT/ML FlexPen Commonly known as:  NOVOLOG MIX 70/30 FLEXPEN Take 10 units before breakfast and 6 units before supper. Dx Code E11.65   metFORMIN 1000 MG tablet Commonly known as:  GLUCOPHAGE Take 1,000 mg by mouth 2 (two) times daily with a meal.   Pen Needles 31G X 5 MM Misc 1 Tube by Does not apply route 2 (two) times  daily. Dx. E11.65   pioglitazone 15 MG tablet Commonly known as:  ACTOS Take 15 mg by mouth daily.   simvastatin 40 MG tablet Commonly known as:  ZOCOR Take 1 tablet (40 mg total) by mouth every evening.   torsemide 20 MG tablet Commonly known as:  DEMADEX Take 20 mg by mouth daily. Take 1/2 tablet daily   valsartan 80 MG tablet Commonly known as:  DIOVAN Take 1 tablet (80 mg total) by mouth daily.       Allergies:  Allergies  Allergen Reactions  . Lasix [Furosemide] Other (See Comments)    Constipation    Past Medical History:  Diagnosis Date  . Allergy   . Anemia   . Asthma   . BPH (benign prostatic hyperplasia)   . CAD (coronary artery disease)    a. s/p MI 09/1993;  b. known CTO of RCA;  c. 06/2014 Cath: LM short/mod dzs, LAD 95ost, LCX nl, RI nl, RCA 100, EF 55%;  c. 06/2014 s/p CABG x 4: LIMA->LAD, VG->D1, VG->OM1, VG->Acute Marginal.  . Carotid stenosis    a. Carotid US 2/42:  RICA 68-34%; LICA 19-62% >> FU 1 year  . CKD (chronic kidney disease), stage III   . Constipation    in the past 2-3 weeks  . Diabetes ([place])    AODM  . Diastolic dysfunction    a. 05/2015 Echo: EF 60-65%, LVH, Gr 1 DD, mild MR, mod dil RA/LA, mod TR, PASP 2mHg.  . Essential hypertension   . GERD (gastroesophageal reflux disease)   . Hyperlipidemia     Past Surgical History:  Procedure Laterality Date  . ANGIOPLASTY  09/22/1993   POBA of RCA (Dr. RMarella Chimes  . BACK SURGERY  2000   back fusion - Dr. HEarnie Larsson . CARDIAC CATHETERIZATION  11/28/1998   "silent occlusion" of RCA w/collaterals from left coronary system, prox LAD w/40-50% eccentric narrowing, 1st diagonal with 70-80% eccentric narrowing, 85% narrowing of prox small OM1 (Dr. RMarella Chimes  . CAROTID DOPPLER  07/2011   left subclavian (50-69%); right bulb (0-49%); RICA (normal); left mid-distal CCA (0-49%); left bulb/prox ICA (50-69%); left vertebral with abnormal antegrade flow  . CORONARY ARTERY BYPASS GRAFT N/A  05/21/2014   Procedure: CORONARY ARTERY BYPASS GRAFTING (CABG) x  four, using left internal mammary artery and right leg greater saphenous vein harvested endoscopically;  Surgeon: SMelrose Nakayama MD;  Location: [facility];  Service: Open Heart Surgery;  Laterality: N/A;  . INTRAOPERATIVE TRANSESOPHAGEAL ECHOCARDIOGRAM N/A 05/21/2014   Procedure: INTRAOPERATIVE TRANSESOPHAGEAL ECHOCARDIOGRAM;  Surgeon: Steven C Hendrickson, MD;  Location: [facility] OR;  Service: Open Heart Surgery;  Laterality: N/A;  . LEFT HEART CATHETERIZATION WITH CORONARY ANGIOGRAM N/A 05/19/2014   Procedure: LEFT HEART CATHETERIZATION WITH CORONARY ANGIOGRAM;  Surgeon: Michael D Cooper, MD;  Location: [facility] CATH [facility];  Service: Cardiovascular;  Laterality: N/A;  . NASAL SINUS SURGERY  2010  . NM MYOCAR PERF WALL MOTION  09/19/2009   bruce myoview - mild perfusion defect in basal inferior region (infarct/scar), EF 60%, low risk scan  . RENAL DOPPLER  10/2011   SMA w/ 70-99% diameter reduction & high grade stenosis; R&L renals w/narrowing and increased velocities (60-99%), R kidney smaller than L  . TRANSTHORACIC ECHOCARDIOGRAM  10/20/2012   EF 55-60%, moderate concentric hypertrophy, ventricular septum thickness increased, calcified MV annulus  . VASECTOMY  1963    Family History  Problem Relation Age of Onset  . Uterine cancer Mother   . Stroke Father   . Diabetes Sister   . Colon polyps Brother   . Heart disease Brother   . Colon cancer Neg Hx   . Esophageal cancer Neg Hx   . Rectal cancer Neg Hx   . Stomach cancer Neg Hx     Social History:  reports that he quit smoking about 44 years ago. His smoking use included Cigarettes. His smokeless tobacco use includes Snuff. He reports that he drinks about 1.8 oz of alcohol per week . He reports that he does not use drugs.   Review of Systems   Lipid history: Most recent LDL 62 in July   No results found for: CHOL, HDL, LDLCALC, LDLDIRECT, TRIG, CHOLHDL         Hypertension:  off valsartan for the last 2 weeks, he is waiting for his cardiology appointment  Most recent eye exam was 9/17  Most recent foot exam: 8/18    LABS:  No visits with results within 1 Week(s) from this visit.  Latest known visit with results is:  Abstract on 01/03/2017  Component Date Value Ref Range Status  . Hemoglobin A1C 12/03/2016 9.7   Final    Physical Examination:  BP (!) 150/78   Pulse 64   Ht 5' 10" (1.778 m)   Wt 152 lb 6.4 oz (69.1 kg)   SpO2 98%   BMI 21.87 kg/m       ASSESSMENT:  Diabetes type 2, uncontrolled with A1c 9.7 recently, current BMI about 22    See history of present illness for detailed discussion of current diabetes management, blood sugar patterns and problems identified  He has started low-dose premixed insulin this month Objectively doing very well and has no problems with doing self injections As discussed above his blood sugars are high at suppertime but much better fasting usually He has not done readings after breakfast or dinner  Mild hypertension: Blood pressure is  relatively higher and he will follow-up with his PCP or cardiologist for treatment  PLAN:      Start Checking blood sugars more consistently in between meals and especially after supper  He will increase his morning insulin to 14 units  Continue Actos and metformin  Consultation with dietitian for meal planning  Discussed that if he wants to exercise in the morning he should drink some juice if his blood sugars are near normal  Discussed blood sugar targets   before and after meals   Patient Instructions  More sugars 2-3 hrs after various meal  Am insulin 14 units  Check sugar before exercise       , 01/15/2017, 10:25 AM   Note: This office note was prepared with Dragon voice recognition system technology. Any transcriptional errors that result from this process are unintentional.  

## 2017-01-21 ENCOUNTER — Telehealth: Payer: Self-pay | Admitting: Cardiovascular Disease

## 2017-01-21 MED ORDER — LOSARTAN POTASSIUM 50 MG PO TABS: 50.0000 mg | ORAL_TABLET | Freq: Every day | ORAL | 0 refills | Status: DC

## 2017-01-21 NOTE — Telephone Encounter (Signed)
Patient calling, patient asking for refill for 30 day supply of Valsartan 80 mg and did not seem aware that medication was recalled.

## 2017-01-21 NOTE — Telephone Encounter (Signed)
Office note 01/15/17 Dr Leilani Merl recorded 150/78.  Pt advised I will send in a prescription for losartan 50 mg daily instead of valsartan 80 mg daily. Pt advised to check his BP a couple times a week for 3-4 weeks, keep appt already scheduled with Richardson Dopp, PA 02/12/17

## 2017-01-21 NOTE — Telephone Encounter (Signed)
Pt's supply may not have been affected by recall and his pharmacy may still be able to supply in which case it is ok for patient to remain on valsartan. If pt unable to get valsartan would recommend change to losartan 50mg  daily. If patient able have him monitor pressures several times weekly for 3-4 weeks and call with any concerns or changes.

## 2017-01-21 NOTE — Telephone Encounter (Signed)
Pt states he has not taken valsartan for about a month. Pt states he is seeing Dr Dwyane Dee for diabetes and BP was checked then, does not remember reading but said it was good.

## 2017-01-23 ENCOUNTER — Telehealth: Payer: Self-pay | Admitting: Endocrinology

## 2017-01-23 ENCOUNTER — Other Ambulatory Visit: Payer: Self-pay

## 2017-01-23 MED ORDER — GLUCOSE BLOOD VI STRP: ORAL_STRIP | 2 refills | Status: DC

## 2017-01-23 NOTE — Telephone Encounter (Signed)
Judge Stall, patients daughter, and let her know that I have sent in a new prescription for 90 days for the test strips and to check to make sure they are received and sent.

## 2017-01-23 NOTE — Telephone Encounter (Signed)
MEDICATION: glucose blood (ACCU-CHEK GUIDE) test strip  PHARMACY:  [facility], [place] [phone] (Phone) [phone] (Fax)   IS THIS A 90 DAY SUPPLY : yes  IS PATIENT OUT OF MEDICTAION: no  IF NOT; HOW MUCH IS LEFT: box of 50   LAST APPOINTMENT DATE:01/15/17  NEXT APPOINTMENT DATE:02/14/17  OTHER COMMENTS: Call patient's daughter Edd Fabian at [phone] with any questions   **Let patient know to contact pharmacy at the end of the day to make sure medication is ready. **  ** Please notify patient to allow 48-72 hours to process**  **Encourage patient to contact the pharmacy for refills or they can request refills through [facility]**

## 2017-01-28 ENCOUNTER — Other Ambulatory Visit: Payer: Self-pay

## 2017-01-28 MED ORDER — GLUCOSE BLOOD VI STRP: ORAL_STRIP | 2 refills | Status: DC

## 2017-01-28 NOTE — Telephone Encounter (Signed)
Patient needs a 50 count box to get him through until [facility] releases the rx on 02/08/17.   [id number] [place], [place] [phone] (Phone) [phone] (Fax)   Please fill.

## 2017-01-28 NOTE — Telephone Encounter (Signed)
Called patient and left a voice message to let him know that I have sent in a prescription for his test strips for 50 count box until his order can be shipped from [facility].

## 2017-02-11 NOTE — Progress Notes (Signed)
Cardiology Office Note:    Date:  02/12/2017   ID:  Justin Salinas, DOB 1940-08-03, MRN [id number]  PCP:  Elayne Snare, MD  Cardiologist:  Dr. [name]    Referring MD: Elayne Snare, MD   Chief Complaint  Patient presents with  . Coronary Artery Disease    Follow-up    History of Present Illness:    Justin Salinas is a 76 y.o. male with a hx of CAD status post prior PCI in the 1990s and subsequent multivessel CABG in 12/15, HTN, HL, diabetes, CKD.  He was last seen in 04/2015.  Mr. [name] returns for routine cardiology follow-up. He is here alone. He denies chest pain, shortness of breath, syncope, orthopnea, PND. He has had problems with lower extremity edema and does take torsemide. He does not take this on a daily basis. He exercises several times a week.  Prior CV studies:   The following studies were reviewed today:  Carotid US 5/18 R 1-39; L 40-59, elevated velocities R subclavian >> FU 1 year  Carotid US 6/78 RICA 93-81%; LICA 01-75% >> FU 1 year  Echo 12/15 Moderate LVH, EF 60-65%, normal wall motion, grade 1 diastolic dysfunction, aortic sclerosis without stenosis, MAC, mild MR, moderate BAE, moderate TR, PASP 40 mmHg  LHC 12/15 LM:  50% proximal  LAD:  Ostial/proximal 95%, with heavy calcification. D1 75% proximal   LCx:  AV circumflex has mild irregularity and supplies 2 OM branches.  RCA: Severe calcification. Total occlusion at the ostium.  left-to-right collaterals.  LVEF of 55% Sent for CABG  Renal Art [place] 5/14 SMA 70-99% R and L RA 60-99%  Carotid US 5/14 L Subclavian 50-69% R ICA 0-49%; L ICA 50-69% >> repeat 1 year  Myoview 4/11 Inferior scar, no ischemia, EF 60%  Past Medical History:  Diagnosis Date  . Allergy   . Anemia   . Asthma   . BPH (benign prostatic hyperplasia)   . CAD (coronary artery disease)    a. s/p MI 09/1993;  b. known CTO of RCA;  c. 06/2014 Cath: LM short/mod dzs, LAD 95ost, LCX nl, RI nl, RCA 100, EF  55%;  c. 06/2014 s/p CABG x 4: LIMA->LAD, VG->D1, VG->OM1, VG->Acute Marginal.  . Carotid stenosis    a. Carotid US 1/02:  RICA 58-52%; LICA 77-82% >> FU 1 year  . CKD (chronic kidney disease), stage III   . Constipation    in the past 2-3 weeks  . Diabetes ([place])    AODM  . Diastolic dysfunction    a. 05/2015 Echo: EF 60-65%, LVH, Gr 1 DD, mild MR, mod dil RA/LA, mod TR, PASP 13mHg.  . Essential hypertension   . GERD (gastroesophageal reflux disease)   . Hyperlipidemia     Past Surgical History:  Procedure Laterality Date  . ANGIOPLASTY  09/22/1993   POBA of RCA (Dr. RMarella Chimes  . BACK SURGERY  2000   back fusion - Dr. HEarnie Larsson . CARDIAC CATHETERIZATION  11/28/1998   "silent occlusion" of RCA w/collaterals from left coronary system, prox LAD w/40-50% eccentric narrowing, 1st diagonal with 70-80% eccentric narrowing, 85% narrowing of prox small OM1 (Dr. RMarella Chimes  . CAROTID DOPPLER  07/2011   left subclavian (50-69%); right bulb (0-49%); RICA (normal); left mid-distal CCA (0-49%); left bulb/prox ICA (50-69%); left vertebral with abnormal antegrade flow  . CORONARY ARTERY BYPASS GRAFT N/A 05/21/2014   Procedure: CORONARY ARTERY BYPASS GRAFTING (CABG) x  four, using left internal  mammary artery and right leg greater saphenous vein harvested endoscopically;  Surgeon: Melrose Nakayama, MD;  Location: [place];  Service: Open Heart Surgery;  Laterality: N/A;  . INTRAOPERATIVE TRANSESOPHAGEAL ECHOCARDIOGRAM N/A 05/21/2014   Procedure: INTRAOPERATIVE TRANSESOPHAGEAL ECHOCARDIOGRAM;  Surgeon: Melrose Nakayama, MD;  Location: [place];  Service: Open Heart Surgery;  Laterality: N/A;  . LEFT HEART CATHETERIZATION WITH CORONARY ANGIOGRAM N/A 05/19/2014   Procedure: LEFT HEART CATHETERIZATION WITH CORONARY ANGIOGRAM;  Surgeon: Blane Ohara, MD;  Location: [facility] CATH LAB;  Service: Cardiovascular;  Laterality: N/A;  . NASAL SINUS SURGERY  2010  . NM MYOCAR PERF WALL MOTION  09/19/2009    bruce myoview - mild perfusion defect in basal inferior region (infarct/scar), EF 60%, low risk scan  . RENAL DOPPLER  10/2011   SMA w/ 70-99% diameter reduction & high grade stenosis; R&L renals w/narrowing and increased velocities (60-99%), R kidney smaller than L  . TRANSTHORACIC ECHOCARDIOGRAM  10/20/2012   EF 55-60%, moderate concentric hypertrophy, ventricular septum thickness increased, calcified MV annulus  . VASECTOMY  1963    Current Medications: Current Meds  Medication Sig  . aspirin 81 MG tablet Take 81 mg by mouth daily.  Marland Kitchen glucose blood (ACCU-CHEK AVIVA PLUS) test strip Check blood glucose twice daily Dx code E11.65  . glucose blood (ACCU-CHEK GUIDE) test strip Use to check blood sugars 2 times daily  . insulin aspart protamine - aspart (NOVOLOG MIX 70/30 FLEXPEN) (70-30) 100 UNIT/ML FlexPen Take 10 units before breakfast and 6 units before supper. Dx Code E11.65  . Insulin Pen Needle (PEN NEEDLES) 31G X 5 MM MISC 1 Tube by Does not apply route 2 (two) times daily. Dx. E11.65  . Lancets Misc. (ACCU-CHEK SOFTCLIX LANCET DEV) KIT   . losartan (COZAAR) 50 MG tablet Take 1 tablet (50 mg total) by mouth daily.  . metFORMIN (GLUCOPHAGE) 1000 MG tablet Take 1,000 mg by mouth 2 (two) times daily with a meal.   . Multiple Vitamins-Minerals (CENTRUM SILVER ADULT 50+ PO) Take by mouth.  . pioglitazone (ACTOS) 15 MG tablet Take 15 mg by mouth daily.  . simvastatin (ZOCOR) 40 MG tablet Take 1 tablet (40 mg total) by mouth every evening.  . torsemide (DEMADEX) 20 MG tablet Take 20 mg by mouth daily. Take 1/2 tablet daily   Current Facility-Administered Medications for the 02/12/17 encounter (Office Visit) with Richardson Dopp T, PA-C  Medication  . 0.9 %  sodium chloride infusion     Allergies:   Lasix [furosemide]   Social History   Social History  . Marital status: Married    Spouse name: N/A  . Number of children: 2  . Years of education: N/A   Social History Main Topics  .  Smoking status: Former Smoker    Types: Cigarettes    Quit date: 06/04/1972  . Smokeless tobacco: Current User    Types: Snuff     Comment: quit about 40 years  . Alcohol use 1.8 oz/week    3 Standard drinks or equivalent per week     Comment: a glass of red wine 3 or 4 days a week.  . Drug use: No  . Sexual activity: Not Asked   Other Topics Concern  . None   Social History Narrative  . None     Family Hx: The patient's family history includes Colon polyps in his brother; Diabetes in his sister; Heart disease in his brother; Stroke in his father; Uterine cancer in his mother.  There is no history of Colon cancer, Esophageal cancer, Rectal cancer, or Stomach cancer.  ROS:   Please see the history of present illness.    ROS All other systems reviewed and are negative.   EKGs/Labs/Other Test Reviewed:    EKG:  EKG is  ordered today.  The ekg ordered today demonstrates Sinus bradycardia, HR 59, normal axis, nonspecific ST-T wave changes, anteroseptal Q waves, QTc 407 ms, no change from prior tracing  Recent Labs: No results found for requested labs within last 8760 hours.   Recent Lipid Panel No results found for: CHOL, TRIG, HDL, CHOLHDL, LDLCALC, LDLDIRECT  Physical Exam:    VS:  BP (!) 144/62   Pulse (!) 58   Ht 5' 10" (1.778 m)   Wt 166 lb 1.9 oz (75.4 kg)   BMI 23.84 kg/m     Wt Readings from Last 3 Encounters:  02/12/17 166 lb 1.9 oz (75.4 kg)  01/15/17 152 lb 6.4 oz (69.1 kg)  01/03/17 153 lb (69.4 kg)     Physical Exam  Constitutional: He is oriented to person, place, and time. He appears well-developed and well-nourished. No distress.  HENT:  Head: Normocephalic and atraumatic.  Eyes: No scleral icterus.  Neck: Normal range of motion. No JVD present. Carotid bruit is not present.  Cardiovascular: Normal rate, regular rhythm, S1 normal and S2 normal.   No murmur heard. Pulmonary/Chest: Effort normal and breath sounds normal. He has no wheezes. He has no  rhonchi. He has no rales.  Abdominal: Soft. There is no hepatomegaly.  Musculoskeletal: He exhibits edema (1+ bilateral LE edema).  Neurological: He is alert and oriented to person, place, and time.  Skin: Skin is warm and dry.  Psychiatric: He has a normal mood and affect.    ASSESSMENT:    1. Coronary artery disease involving native coronary artery of native heart without angina pectoris   2. Bilateral carotid artery disease ([place])   3. Essential hypertension   4. Hyperlipidemia, unspecified hyperlipidemia type    PLAN:    In order of problems listed above:  1. Coronary artery disease involving native coronary artery of native heart without angina pectoris Status post CABG in 12/15. He is doing well without angina. Continue aspirin, statin.    2. Bilateral carotid artery disease ([place]) Stable by ultrasound 5/18. Repeat due in 5/19. Continue aspirin, statin.  3. Essential hypertension Blood pressure above target. I have asked him to monitor his blood pressure over the next couple of weeks and send me some readings. If his blood pressure remains above target, I will adjust his losartan to 100 mg daily.  4. Hyperlipidemia, unspecified hyperlipidemia type Recent lipid panel with PCP in 7/18 demonstrated LDL 62. Continue current therapy.   Dispo:  Return in about 1 year (around 02/12/2018) for Routine Follow Up, w/ Dr. Burt Knack, w/ Richardson Dopp, PA-C.   Medication Adjustments/Labs and Tests Ordered: Current medicines are reviewed at length with the patient today.  Concerns regarding medicines are outlined above.  Tests Ordered: Orders Placed This Encounter  Procedures  . EKG 12-Lead   Medication Changes: No orders of the defined types were placed in this encounter.   Signed, Richardson Dopp, PA-C  02/12/2017 8:53 AM    [facility], [place], [place]  [address] Phone: [phone]; Fax: [phone]

## 2017-02-12 ENCOUNTER — Ambulatory Visit (INDEPENDENT_AMBULATORY_CARE_PROVIDER_SITE_OTHER): Payer: Medicare HMO | Admitting: Physician Assistant

## 2017-02-12 ENCOUNTER — Encounter: Payer: Self-pay | Admitting: Physician Assistant

## 2017-02-12 VITALS — BP 144/62 | HR 58 | Ht 70.0 in | Wt 166.1 lb

## 2017-02-12 DIAGNOSIS — I10 Essential (primary) hypertension: Secondary | ICD-10-CM

## 2017-02-12 DIAGNOSIS — I739 Peripheral vascular disease, unspecified: Secondary | ICD-10-CM

## 2017-02-12 DIAGNOSIS — E785 Hyperlipidemia, unspecified: Secondary | ICD-10-CM | POA: Diagnosis not present

## 2017-02-12 DIAGNOSIS — I779 Disorder of arteries and arterioles, unspecified: Secondary | ICD-10-CM

## 2017-02-12 DIAGNOSIS — I2510 Atherosclerotic heart disease of native coronary artery without angina pectoris: Secondary | ICD-10-CM

## 2017-02-12 NOTE — Patient Instructions (Signed)
Medication Instructions:  Your physician recommends that you continue on your current medications as directed. Please refer to the Current Medication list given to you today.   Labwork: NONE ORDERED TODAY   Testing/Procedures: NONE ORDERED TODAY  Follow-Up: Your physician wants you to follow-up in: [place].  You will receive a reminder letter in the mail two months in advance. If you don't receive a letter, please call our office to schedule the follow-up appointment.   Any Other Special Instructions Will Be Listed Below (If Applicable). CHECK BLOOD PRESSURE 3-4 TIMES A WEEK AFTER 2 WEEKS CALL OR SEND IN READINGS TO [place], [place]    If you need a refill on your cardiac medications before your next appointment, please call your pharmacy.

## 2017-02-13 ENCOUNTER — Other Ambulatory Visit: Payer: Self-pay

## 2017-02-13 ENCOUNTER — Telehealth: Payer: Self-pay | Admitting: Endocrinology

## 2017-02-13 MED ORDER — PEN NEEDLES 31G X 5 MM MISC: 1.0000 | Freq: Two times a day (BID) | 1 refills | Status: DC

## 2017-02-13 NOTE — Telephone Encounter (Signed)
MEDICATION: Insulin Pen Needle (PEN NEEDLES) 31G X 5 MM MISC  PHARMACY:   [id number] [place], [place] - [address] [phone] (Phone) [phone] (Fax)     IS THIS A 90 DAY SUPPLY : N  IS PATIENT OUT OF MEDICATION: N  IF NOT; HOW MUCH IS LEFT: about 20   LAST APPOINTMENT DATE: 01/15/17  NEXT APPOINTMENT DATE: 02/14/17  OTHER COMMENTS:    **Let patient know to contact pharmacy at the end of the day to make sure medication is ready. **  ** Please notify patient to allow 48-72 hours to process**  **Encourage patient to contact the pharmacy for refills or they can request refills through [facility]**

## 2017-02-13 NOTE — Telephone Encounter (Signed)
Called patient and let him know that [facility].

## 2017-02-14 ENCOUNTER — Ambulatory Visit (INDEPENDENT_AMBULATORY_CARE_PROVIDER_SITE_OTHER): Payer: Medicare HMO | Admitting: Endocrinology

## 2017-02-14 ENCOUNTER — Encounter: Payer: Self-pay | Admitting: Endocrinology

## 2017-02-14 ENCOUNTER — Other Ambulatory Visit (INDEPENDENT_AMBULATORY_CARE_PROVIDER_SITE_OTHER): Payer: Medicare HMO

## 2017-02-14 VITALS — BP 130/70 | HR 51 | Ht 70.0 in | Wt 162.6 lb

## 2017-02-14 DIAGNOSIS — E1165 Type 2 diabetes mellitus with hyperglycemia: Secondary | ICD-10-CM | POA: Diagnosis not present

## 2017-02-14 DIAGNOSIS — Z794 Long term (current) use of insulin: Secondary | ICD-10-CM | POA: Diagnosis not present

## 2017-02-14 DIAGNOSIS — N289 Disorder of kidney and ureter, unspecified: Secondary | ICD-10-CM

## 2017-02-14 LAB — COMPREHENSIVE METABOLIC PANEL
ALT: 15 U/L (ref 0–53)
AST: 18 U/L (ref 0–37)
Albumin: 4 g/dL (ref 3.5–5.2)
Alkaline Phosphatase: 52 U/L (ref 39–117)
BUN: 28 mg/dL — ABNORMAL HIGH (ref 6–23)
CO2: 32 mEq/L (ref 19–32)
Calcium: 9.9 mg/dL (ref 8.4–10.5)
Chloride: 102 mEq/L (ref 96–112)
Creatinine, Ser: 1.76 mg/dL — ABNORMAL HIGH (ref 0.40–1.50)
GFR: 39.95 mL/min — ABNORMAL LOW (ref >60.00)
Glucose, Bld: 123 mg/dL — ABNORMAL HIGH (ref 70–99)
Potassium: 4.8 mEq/L (ref 3.5–5.1)
Sodium: 140 mEq/L (ref 135–145)
Total Bilirubin: 0.4 mg/dL (ref 0.2–1.2)
Total Protein: 6.8 g/dL (ref 6.0–8.3)

## 2017-02-14 LAB — MICROALBUMIN / CREATININE URINE RATIO
Creatinine,U: 131.8 mg/dL
Microalb Creat Ratio: 0.5 mg/g (ref 0.0–30.0)
Microalb, Ur: <0.7 mg/dL (ref 0.0–1.9)

## 2017-02-14 NOTE — Progress Notes (Signed)
Patient ID: Justin Salinas, male   DOB: 1940/09/16, 76 y.o.   MRN: [id number]           Reason for Appointment: Follow-up for Type 2 Diabetes    History of Present Illness:          Date of diagnosis of type 2 diabetes mellitus:2000        Background history:   His diabetes had been mild initially and treated with metformin He had subsequently other medications added including Amaryl and Januvia No details are available from PCP office, [name] in 2015 was 7.6  Recent history:   Insulin regimen: NovoLog mix, 10 units at breakfast and 6 at supper  Non-insulin hypoglycemic drugs the patient is taking are: Metformin 1 g twice a day, Actos 15 mg daily   He had a baseline A1c A1c 9.7   Current management, blood sugar patterns and problems identified:  Because of his poor control he has been started on insulin on 01/03/17.  He has not increased his morning insulin to 14 units as directed in August, still taking 10 units  Difficult to get a good idea of his blood sugar patterns as he is only checking blood sugar before breakfast and suppertime  He now says that he is eating variable amounts of food at lunchtime and sometimes only a small snack  His blood sugars are mostly higher before supper time unless he is eating very little or no food during the day  Do not have any postprandial readings after breakfast or supper  Fasting readings are relatively stable and averaging below 130  No recent labs available  He has started back being more active and tries to sometimes go for exercise in morning before eating  [name] with current regimen        Side effects from medications have been: None  Compliance with the medical regimen: Good  Glucose monitoring:  done 2 times a day         Glucometer:  Accu-Chek guide .      Blood Glucose readings by download:  Mean values apply above for all meters except median for One Touch  PRE-MEAL Fasting Lunch Dinner Bedtime  Overall  Glucose range:  97-1 56   71-213    Mean/median: 127  153  146   Self-care: The diet that the patient has been following is: tries to limit sweets, fried food       Typical meal intake: Breakfast is eggs meat, toast.  Lunch: Sandwich or.Crackers with or without peanut butter usually               Dietician visit, most recent: Never               Exercise:   walking or other exercise at the gym 3-4/7 days for at least 30 min   Weight history:   Wt Readings from Last 3 Encounters:  02/14/17 162 lb 9.6 oz (73.8 kg)  02/12/17 166 lb 1.9 oz (75.4 kg)  01/15/17 152 lb 6.4 oz (69.1 kg)    Glycemic control:   Lab Results  Component Value Date   HGBA1C 9.7 12/03/2016   HGBA1C 7.6 (H) 05/19/2014   HGBA1C 8.1 (H) 03/15/2008   Lab Results  Component Value Date   MICROALBUR 0.2 12/01/2007   CREATININE 1.30 (H) 04/13/2015   Lab Results  Component Value Date   MICRALBCREAT 1.8 12/01/2007   Microalbumin ratio 9 in 12/2015  No results found for:  FRUCTOSAMINE    Allergies as of 02/14/2017      Reactions   Lasix [furosemide] Other (See Comments)   Constipation      Medication List       Accurate as of 02/14/17 10:00 AM. Always use your most recent med list.          ACCU-CHEK SOFTCLIX LANCET DEV Kit   aspirin 81 MG tablet Take 81 mg by mouth daily.   CENTRUM SILVER ADULT 50+ PO Take by mouth.   glucose blood test strip Commonly known as:  ACCU-CHEK AVIVA PLUS Check blood glucose twice daily Dx code E11.65   glucose blood test strip Commonly known as:  ACCU-CHEK GUIDE Use to check blood sugars 2 times daily   insulin aspart protamine - aspart (70-30) 100 UNIT/ML FlexPen Commonly known as:  NOVOLOG MIX 70/30 FLEXPEN Take 10 units before breakfast and 6 units before supper. Dx Code E11.65   losartan 50 MG tablet Commonly known as:  COZAAR Take 1 tablet (50 mg total) by mouth daily.   metFORMIN 1000 MG tablet Commonly known as:  GLUCOPHAGE Take 1,000  mg by mouth 2 (two) times daily with a meal.   Pen Needles 31G X 5 MM Misc 1 Tube by Does not apply route 2 (two) times daily. Dx. E11.65   pioglitazone 15 MG tablet Commonly known as:  ACTOS Take 15 mg by mouth daily.   simvastatin 40 MG tablet Commonly known as:  ZOCOR Take 1 tablet (40 mg total) by mouth every evening.   torsemide 20 MG tablet Commonly known as:  DEMADEX Take 20 mg by mouth daily. Take 1/2 tablet daily       Allergies:  Allergies  Allergen Reactions  . Lasix [Furosemide] Other (See Comments)    Constipation    Past Medical History:  Diagnosis Date  . Allergy   . Anemia   . Asthma   . BPH (benign prostatic hyperplasia)   . CAD (coronary artery disease)    a. s/p MI 09/1993;  b. known CTO of RCA;  c. 06/2014 Cath: LM short/mod dzs, LAD 95ost, LCX nl, RI nl, RCA 100, EF 55%;  c. 06/2014 s/p CABG x 4: LIMA->LAD, VG->D1, VG->OM1, VG->Acute Marginal.  . Carotid stenosis    a. Carotid US 5/78:  RICA 46-96%; LICA 29-52% >> FU 1 year  . CKD (chronic kidney disease), stage III   . Constipation    in the past 2-3 weeks  . Diabetes ([place])    AODM  . Diastolic dysfunction    a. 05/2015 Echo: EF 60-65%, LVH, Gr 1 DD, mild MR, mod dil RA/LA, mod TR, PASP 2mHg.  . Essential hypertension   . GERD (gastroesophageal reflux disease)   . Hyperlipidemia     Past Surgical History:  Procedure Laterality Date  . ANGIOPLASTY  09/22/1993   POBA of RCA (Dr. RMarella Chimes  . BACK SURGERY  2000   back fusion - Dr. HEarnie Larsson . CARDIAC CATHETERIZATION  11/28/1998   "silent occlusion" of RCA w/collaterals from left coronary system, prox LAD w/40-50% eccentric narrowing, 1st diagonal with 70-80% eccentric narrowing, 85% narrowing of prox small OM1 (Dr. RMarella Chimes  . CAROTID DOPPLER  07/2011   left subclavian (50-69%); right bulb (0-49%); RICA (normal); left mid-distal CCA (0-49%); left bulb/prox ICA (50-69%); left vertebral with abnormal antegrade flow  . CORONARY ARTERY  BYPASS GRAFT N/A 05/21/2014   Procedure: CORONARY ARTERY BYPASS GRAFTING (CABG) x  four, using left internal mammary  artery and right leg greater saphenous vein harvested endoscopically;  Surgeon: Loreli Slot, MD;  Location: [facility] OR;  Service: Open Heart Surgery;  Laterality: N/A;  . INTRAOPERATIVE TRANSESOPHAGEAL ECHOCARDIOGRAM N/A 05/21/2014   Procedure: INTRAOPERATIVE TRANSESOPHAGEAL ECHOCARDIOGRAM;  Surgeon: Loreli Slot, MD;  Location[facility] OR;  Service: Open Heart Surgery;  Laterality: N/A;  . LEFT HEART CATHETERIZATION WITH CORONARY ANGIOGRAM N/A 05/19/2014   Procedure: LEFT HEART CATHETERIZATION WITH CORONARY ANGIOGRAM;  Surgeon: Micheline Chapman, MD;  Location: [facility] CATH LAB;  Service: Cardiovascular;  Laterality: N/A;  . NASAL SINUS SURGERY  2010  . NM MYOCAR PERF WALL MOTION  09/19/2009   bruce myoview - mild perfusion defect in basal inferior region (infarct/scar), EF 60%, low risk scan  . RENAL DOPPLER  10/2011   SMA w/ 70-99% diameter reduction & high grade stenosis; R&L renals w/narrowing and increased velocities (60-99%), R kidney smaller than L  . TRANSTHORACIC ECHOCARDIOGRAM  10/20/2012   EF 55-60%, moderate concentric hypertrophy, ventricular septum thickness increased, calcified MV annulus  . VASECTOMY  1963    Family History  Problem Relation Age of Onset  . Uterine cancer Mother   . Stroke Father   . Diabetes Sister   . Colon polyps Brother   . Heart disease Brother   . Colon cancer Neg Hx   . Esophageal cancer Neg Hx   . Rectal cancer Neg Hx   . Stomach cancer Neg Hx     Social History:  reports that he quit smoking about 44 years ago. His smoking use included Cigarettes. His smokeless tobacco use includes Snuff. He reports that he drinks about 1.8 oz of alcohol per week . He reports that he does not use drugs.   Review of Systems   Lipid history: Most recent LDL 62 in July   No results found for: CHOL, HDL, LDLCALC, LDLDIRECT, TRIG, CHOLHDL          Hypertension: off valsartan for the last 2 weeks, he is waiting for his cardiology appointment  Most recent eye exam was 9/17  Most recent foot exam: 8/18    LABS:  No visits with results within 1 Week(s) from this visit.  Latest known visit with results is:  Abstract on 01/03/2017  Component Date Value Ref Range Status  . Hemoglobin A1C 12/03/2016 9.7   Final    Physical Examination:  BP 130/70   Pulse (!) 51   Ht 5\' 10"  (1.778 m)   Wt 162 lb 9.6 oz (73.8 kg)   SpO2 97%   BMI 23.33 kg/m       ASSESSMENT:  Diabetes type 2, uncontrolled with A1c 9.7 recently, current BMI about 22    See history of present illness for detailed discussion of current diabetes management, blood sugar patterns and problems identified  Although his fasting readings are fairly good he is not doing any readings after meals and his blood sugars at suppertime are mostly high depending on his intake during the day His blood sugars overall average 146 He has not been following instructions on his previous discharge with not increasing his morning insulin or doing postprandial readings Most likely needs coverage for his lunch when he is eating significant amount of carbohydrate  Discussed premixed nature of his insulin, timing of insulin and need to cover postprandial peaks and not just to check blood sugar before eating Also discussed having balanced meals with some protein consistently including midday  Mild hypertension: Blood pressure is Controlled   PLAN:  Start Checking blood sugars more consistently 2-3 hours after breakfast and supper.  He will add 5 units of insulin at lunchtime unless he is eating only a small snack  No change in evening insulin  If he has consistently high postprandial readings may change him to basal bolus insulin regimen with separate basal insulin  Continue Actos and metformin  Consultation with dietitian for meal planning to be  rescheduled  Discussed that when he goes for exercise in the morning he should drink some juice if his blood sugars are near normal  Discussed blood sugar targets before and after meals   Patient Instructions  Add 5 units of insulin at lunch if eating a meal with Carbs  More sugars 2 hrs after dinner  Check blood sugars on waking up  3/7  Also check blood sugars about 2 hours after a meal and do this after different meals by rotation  Recommended blood sugar levels on waking up is 90-130 and about 2 hours after meal is 130-160  Please bring your blood sugar monitor to each visit, thank you    Counseling time on subjects discussed in assessment and plan sections is over 50% of today's 25 minute visit   Ashle Stief 02/14/2017, 10:00 AM   Note: This office note was prepared with Estate agent. Any transcriptional errors that result from this process are unintentional.

## 2017-02-14 NOTE — Patient Instructions (Signed)
Add 5 units of insulin at lunch if eating a meal with Carbs  More sugars 2 hrs after dinner  Check blood sugars on waking up  3/7  Also check blood sugars about 2 hours after a meal and do this after different meals by rotation  Recommended blood sugar levels on waking up is 90-130 and about 2 hours after meal is 130-160  Please bring your blood sugar monitor to each visit, thank you

## 2017-02-15 ENCOUNTER — Telehealth: Payer: Self-pay | Admitting: Physician Assistant

## 2017-02-15 DIAGNOSIS — N183 Chronic kidney disease, stage 3 unspecified: Secondary | ICD-10-CM

## 2017-02-15 LAB — FRUCTOSAMINE: Fructosamine: 351 umol/L — ABNORMAL HIGH (ref 0–285)

## 2017-02-15 MED ORDER — TORSEMIDE 20 MG PO TABS: 10.0000 mg | ORAL_TABLET | ORAL | Status: DC | PRN

## 2017-02-15 NOTE — Telephone Encounter (Signed)
I s/w pt and went over medication recommendations per Richardson Dopp, PA based on his lab work done with Dr. Ronnie Derby office. Creatinine increased. Advised pt per recommendation to only take Torsemide 1/2 PRN on days when he feels he needs it. I advised pt to look for things such as if his weight is increased by 3 lb's x 1 day or increase sob or increased edema. Pt will have BMET 03/01/17 in our office. Pt thanked me for my call and my help today.

## 2017-02-15 NOTE — Telephone Encounter (Signed)
I received a note from his endocrinologist regarding recent lab work done. His Creatinine has increased some. Please call Justin Salinas and make sure he is not taking Torsemide every day. He should try to take a 1/2 tablet on days he feels he should take it. He should call us if he takes it more than 2 days a week. Please arrange BMET in 2 weeks.  Richardson Dopp, PA-C    02/15/2017 9:20 AM

## 2017-02-28 ENCOUNTER — Other Ambulatory Visit: Payer: Medicare HMO | Admitting: *Deleted

## 2017-02-28 DIAGNOSIS — N183 Chronic kidney disease, stage 3 unspecified: Secondary | ICD-10-CM

## 2017-02-28 LAB — BASIC METABOLIC PANEL
BUN/Creatinine Ratio: 18 (ref 10–24)
BUN: 28 mg/dL — ABNORMAL HIGH (ref 8–27)
CO2: 22 mmol/L (ref 20–29)
Calcium: 9.6 mg/dL (ref 8.6–10.2)
Chloride: 101 mmol/L (ref 96–106)
Creatinine, Ser: 1.57 mg/dL — ABNORMAL HIGH (ref 0.76–1.27)
GFR calc Af Amer: 48 mL/min/{1.73_m2} — ABNORMAL LOW (ref >59)
GFR calc non Af Amer: 42 mL/min/{1.73_m2} — ABNORMAL LOW (ref >59)
Glucose: 77 mg/dL (ref 65–99)
Potassium: 4.6 mmol/L (ref 3.5–5.2)
Sodium: 140 mmol/L (ref 134–144)

## 2017-03-01 ENCOUNTER — Other Ambulatory Visit: Payer: Medicare HMO

## 2017-03-01 ENCOUNTER — Telehealth: Payer: Self-pay

## 2017-03-01 NOTE — Telephone Encounter (Signed)
-----   Message from Liliane Shi, [place] sent at 02/28/2017  4:52 PM EDT ----- Please call the patient Kidney function is stable/improved. Continue with current treatment plan. Richardson Dopp, PA-C   02/28/2017 4:52 PM

## 2017-03-01 NOTE — Telephone Encounter (Signed)
Left message for patient to call back for lab results

## 2017-03-02 ENCOUNTER — Other Ambulatory Visit: Payer: Self-pay | Admitting: Physician Assistant

## 2017-03-04 ENCOUNTER — Telehealth: Payer: Self-pay | Admitting: Physician Assistant

## 2017-03-04 MED ORDER — LOSARTAN POTASSIUM 50 MG PO TABS: 50.0000 mg | ORAL_TABLET | Freq: Every day | ORAL | 0 refills | Status: DC

## 2017-03-04 MED ORDER — LOSARTAN POTASSIUM 50 MG PO TABS: 50.0000 mg | ORAL_TABLET | Freq: Every day | ORAL | 3 refills | Status: DC

## 2017-03-04 NOTE — Telephone Encounter (Signed)
New message     Patient states he is not sure if he needs to continue  Losartan based on last labs.   Pt c/o medication issue:  1. Name of Medication: losartan (COZAAR) 50 MG tablet  2. How are you currently taking this medication (dosage and times per day)? Take 1 tablet (50 mg total) by mouth daily.  3. Are you having a reaction (difficulty breathing--STAT)? NO  4. What is your medication issue? Patient is not sure if he should continue Losatan or be started on something new.  Has no Losartan remaining. Patient uses mail order thru  [place], or [facility] Mail order is 90 day supply or [facility] day

## 2017-03-04 NOTE — Telephone Encounter (Signed)
I returned pt's call who needed a refill on his losartan. Pt has also been informed of his lab results. Refill has been sent to [facility] as well as to [facility] per pt request. I asked pt if he had any blood pressure readings per 02/12/17 ov note we asked pt to monitor his BP for about 2 weeks and call readings. Pt states his BP machine was broken and he only has about 1 week so far. I asked pt to monitor his BP for another week and call with the readings. Pt is agreeable to this plan of care.

## 2017-03-13 DIAGNOSIS — E119 Type 2 diabetes mellitus without complications: Secondary | ICD-10-CM | POA: Diagnosis not present

## 2017-03-13 DIAGNOSIS — H2513 Age-related nuclear cataract, bilateral: Secondary | ICD-10-CM | POA: Diagnosis not present

## 2017-03-13 LAB — HM DIABETES EYE EXAM

## 2017-03-27 ENCOUNTER — Other Ambulatory Visit: Payer: Self-pay

## 2017-03-29 ENCOUNTER — Encounter: Payer: Medicare HMO | Attending: Endocrinology | Admitting: Dietician

## 2017-03-29 ENCOUNTER — Encounter: Payer: Self-pay | Admitting: Dietician

## 2017-03-29 DIAGNOSIS — Z713 Dietary counseling and surveillance: Secondary | ICD-10-CM | POA: Diagnosis not present

## 2017-03-29 DIAGNOSIS — E1165 Type 2 diabetes mellitus with hyperglycemia: Secondary | ICD-10-CM | POA: Diagnosis not present

## 2017-03-29 NOTE — Progress Notes (Signed)
Diabetes Self-Management Education  Visit Type: First/Initial  Appt. Start Time: 0900 Appt. End Time: 1030  03/29/2017  [name], identified by name and date of birth, is a 76 y.o. male with a diagnosis of Diabetes: Type 2. Other history includes Hyperlipidemia, HTN, GERD, GFR 42.  Medications include Actose, Metformin, and Novolog 10 units before breakfast, 5 units before lunch if he eats carbohydrates, and 6 units before dinner.  He is very active and cuts wood each day from about 9-1.    Patient lives alone.  His friend lived with him until her death in 05/29/2016.  She had type 1 Diabetes.  She used to bake a lot and he decreased his sweet intake after her death.  He is a retired Administrator.  He eats out most days for dinner.  ASSESSMENT  Height 5\' 10"  (1.778 m), weight 170 lb (77.1 kg). Body mass index is 24.39 kg/m.      Diabetes Self-Management Education - 03/29/17 9678      Visit Information   Visit Type First/Initial     Initial Visit   Diabetes Type Type 2   Are you currently following a meal plan? No   Are you taking your medications as prescribed? Yes   Date Diagnosed 2000     Health Coping   How would you rate your overall health? Good     Psychosocial Assessment   Patient Belief/Attitude about Diabetes Motivated to manage diabetes   Self-care barriers None   Self-management support Doctor's office   Other persons present Patient   Patient Concerns Nutrition/Meal planning;Glycemic Control   Special Needs None   Preferred Learning Style No preference indicated   Learning Readiness Ready   How often do you need to have someone help you when you read instructions, pamphlets, or other written materials from your doctor or pharmacy? 1 - Never   What is the last grade level you completed in school? 9th grade     Pre-Education Assessment   Patient understands the diabetes disease and treatment process. Needs Review   Patient understands  incorporating nutritional management into lifestyle. Needs Review   Patient undertands incorporating physical activity into lifestyle. Needs Review   Patient understands using medications safely. Needs Review   Patient understands monitoring blood glucose, interpreting and using results Needs Review   Patient understands prevention, detection, and treatment of acute complications. Needs Review   Patient understands prevention, detection, and treatment of chronic complications. Needs Review   Patient understands how to develop strategies to address psychosocial issues. Needs Review   Patient understands how to develop strategies to promote health/change behavior. Needs Review     Complications   Last HgB A1C per patient/outside source 9.7 %  12/03/16   How often do you check your blood sugar? 1-2 times/day   Fasting Blood glucose range (mg/dL) 70-129;130-179   Number of hypoglycemic episodes per month 2   Can you tell when your blood sugar is low? Yes   What do you do if your blood sugar is low? "eat a cookie"   Number of hyperglycemic episodes per week 1   Can you tell when your blood sugar is high? Yes   What do you do if your blood sugar is high? Takes a little more Novolog   Have you had a dilated eye exam in the past 12 months? Yes   Have you had a dental exam in the past 12 months? Yes   Are you checking your  feet? Yes   How many days per week are you checking your feet? 3     Dietary Intake   Breakfast Sausage, eggs, occasional grits, [employer] toast, butter and 2 tsp Karo syrup, fruit OR [employer] simular to home OR [employer] and eats more fruit along with usual OR [place], [employer] toast with butter, liver pudding fried  8:30-9   Snack (morning) none   Lunch none   Snack (afternoon) NABS OR homemade PB and crackers  1   Dinner sweet potato casserole, okra and tomatoes, 7 layer salad OR Meat and vegetables ([employer]) OR at the [employer] (meat and veges OR spaghetti)  5:30-6   Snack  (evening) cookies, air pop popcorn with "NoSalt"   Beverage(s) water, coffee (black), occasional diet soda, 1-2% milk (2 cups per day)     Exercise   Exercise Type Moderate (swimming / aerobic walking)  he cuts his own wook, woks out 3 times per week at the gym for 30-60 minutes   How many days per week to you exercise? 6   How many minutes per day do you exercise? 60   Total minutes per week of exercise 360     Patient Education   Previous Diabetes Education Yes (please comment)  when diagnosed   Nutrition management  Role of diet in the treatment of diabetes and the relationship between the three main macronutrients and blood glucose level;Food label reading, portion sizes and measuring food.;Reviewed blood glucose goals for pre and post meals and how to evaluate the patients' food intake on their blood glucose level.;Meal timing in regards to the patients' current diabetes medication.;Meal options for control of blood glucose level and chronic complications.;Information on hints to eating out and maintain blood glucose control.;Effects of alcohol on blood glucose and safety factors with consumption of alcohol.   Physical activity and exercise  Role of exercise on diabetes management, blood pressure control and cardiac health.   Medications Reviewed patients medication for diabetes, action, purpose, timing of dose and side effects.   Monitoring Purpose and frequency of SMBG.;Identified appropriate SMBG and/or A1C goals.;Yearly dilated eye exam;Daily foot exams   Acute complications Taught treatment of hypoglycemia - the 15 rule.   Chronic complications Relationship between chronic complications and blood glucose control;Assessed and discussed foot care and prevention of foot problems   Psychosocial adjustment Worked with patient to identify barriers to care and solutions;Identified and addressed patients feelings and concerns about diabetes   Personal strategies to promote health Lifestyle  issues that need to be addressed for better diabetes care     Individualized Goals (developed by patient)   Nutrition General guidelines for healthy choices and portions discussed   Physical Activity Exercise 5-7 days per week;30 minutes per day   Medications take my medication as prescribed   Monitoring  test my blood glucose as discussed   Problem Solving lunch, healthy eating out   Reducing Risk examine blood glucose patterns   Health Coping discuss diabetes with (comment)     Post-Education Assessment   Patient understands the diabetes disease and treatment process. Demonstrates understanding / competency   Patient understands incorporating nutritional management into lifestyle. Demonstrates understanding / competency   Patient undertands incorporating physical activity into lifestyle. Demonstrates understanding / competency   Patient understands using medications safely. Demonstrates understanding / competency   Patient understands monitoring blood glucose, interpreting and using results Demonstrates understanding / competency   Patient understands prevention, detection, and treatment of acute complications. Demonstrates understanding /  competency   Patient understands prevention, detection, and treatment of chronic complications. Demonstrates understanding / competency   Patient understands how to develop strategies to address psychosocial issues. Demonstrates understanding / competency   Patient understands how to develop strategies to promote health/change behavior. Demonstrates understanding / competency     Outcomes   Expected Outcomes Demonstrated interest in learning. Expect positive outcomes   Future DMSE PRN   Program Status Completed      Individualized Plan for Diabetes Self-Management Training:   Learning Objective:  Patient will have a greater understanding of diabetes self-management. Patient education plan is to attend individual and/or group sessions per assessed  needs and concerns.   Plan:   Patient Instructions  Continue to stay active. Breakfast, lunch, and dinner daily.  Take a break from work to eat lunch or at least a good snack. Take 5 units of Novolog before lunch when you eat carbohydrates.   Sandwich and fruit, raw veges   Soup, crackers and fruit, raw veges   Chicken salad or tuna salad with crackers and fruit, raw veges   Cottage cheese, fruit, crackers or soup    Soup and sandwich  If you eat lunch you may not want the large bedtime snack. When you eat, have a small amount of protein (meat, nuts, cheese, milk, nuts, pinto beans or other beans). Choose lean meat and mostly baked. Limit fruit to 1 serving per meal.   Check your blood sugar in the morning. Check your blood sugar 2 hours after breakfast or lunch (rotate).  Blood sugar goals  Before breakfast 90-130  2 hours after dinner 130-160       Expected Outcomes:  Demonstrated interest in learning. Expect positive outcomes  Education material provided: Living Well with Diabetes, Food label handouts, A1C conversion sheet, Meal plan card, My Plate and Snack sheet, dining out with diabetes  If problems or questions, patient to contact team via:  Phone  Future DSME appointment: PRN

## 2017-03-29 NOTE — Patient Instructions (Signed)
Continue to stay active. Breakfast, lunch, and dinner daily.  Take a break from work to eat lunch or at least a good snack. Take 5 units of Novolog before lunch when you eat carbohydrates.   Sandwich and fruit, raw veges   Soup, crackers and fruit, raw veges   Chicken salad or tuna salad with crackers and fruit, raw veges   Cottage cheese, fruit, crackers or soup    Soup and sandwich  If you eat lunch you may not want the large bedtime snack. When you eat, have a small amount of protein (meat, nuts, cheese, milk, nuts, pinto beans or other beans). Choose lean meat and mostly baked. Limit fruit to 1 serving per meal.   Check your blood sugar in the morning. Check your blood sugar 2 hours after breakfast or lunch (rotate).  Blood sugar goals  Before breakfast 90-130  2 hours after dinner 130-160

## 2017-04-05 ENCOUNTER — Telehealth: Payer: Self-pay | Admitting: Endocrinology

## 2017-04-05 ENCOUNTER — Other Ambulatory Visit: Payer: Self-pay

## 2017-04-05 MED ORDER — PEN NEEDLES 31G X 5 MM MISC
1.0000 | Freq: Two times a day (BID) | 1 refills | Status: DC
Start: 2017-04-05 — End: 2017-06-05

## 2017-04-05 NOTE — Telephone Encounter (Signed)
Pt needs the needles called in asap to [facility] please he is almost completely out.

## 2017-04-05 NOTE — Telephone Encounter (Signed)
Called patient and let his wife know that I have sent this to the pharmacy for patient.

## 2017-04-11 ENCOUNTER — Telehealth: Payer: Self-pay | Admitting: Family Medicine

## 2017-04-11 NOTE — Telephone Encounter (Signed)
ROI faxed to [facility] @ [facility]

## 2017-04-16 NOTE — Progress Notes (Signed)
Patient ID: Justin Salinas, male   DOB: 1940/09/27, 76 y.o.   MRN: [id number]           Reason for Appointment: Follow-up for Type 2 Diabetes    History of Present Illness:          Date of diagnosis of type 2 diabetes mellitus:2000        Background history:   His diabetes had been mild initially and treated with metformin He had subsequently other medications added including Amaryl and Januvia No details are available from PCP office, [name] in 2015 was 7.6  Recent history:   Insulin regimen: NovoLog mix, 10 units at breakfast and 6 at supper  Non-insulin hypoglycemic drugs the patient is taking are: Metformin 1 g twice a day, Actos 15 mg daily   He had a baseline A1c A1c 9.7, now 6.8   Current management, blood sugar patterns and problems identified:  Because of his poor control he has been started on insulin on 01/03/17.  He has not started to take insulin at lunchtime as directed on the last visit and still has periodically high readings at suppertime, this probably depends on how much he is eating at lunchtime  His meter download show that his time is programmed about 12 hours off and difficult to interpret the download  Difficult to get a good idea of his postprandial blood sugar patterns as he is only checking blood sugar before breakfast and suppertime  He will sometimes increase his insulin by 2 or 3 units if his blood sugars are high before eating or he is planning to eat a large meal  HYPOGLYCEMIA: This has been minimal with only a couple of readings in the 60s in the morning last month but not recently  He does not feel hypoglycemic  He now does try to go to exercise in the mornings frequently and he will then come back in take his insulin and eat breakfast  Most of the time will take his insulin before eating  His weight has been going up slowly  His meal planning was reviewed by dietitian about 3 weeks ago, he is still asking about whether he can drink a  glass of wine        Side effects from medications have been: None  Compliance with the medical regimen: Good  Glucose monitoring:  done 2 times a day         Glucometer:  Accu-Chek guide .      Blood Glucose readings by download:  Mean values apply above for all meters except median for One Touch  PRE-MEAL Fasting Lunch Dinner Bedtime Overall  Glucose range: 64-175    99-222     Mean/median: 129   135   134     Self-care: The diet that the patient has been following is: tries to limit sweets, fried food       Typical meal intake: Breakfast is eggs meat, toast.  Lunch: Sandwich or.Crackers with or without peanut butter usually                Dietician visit, most recent: 03/2017               Exercise:   walking or other exercise at the gym 3-4/7 days for at least 40 min, usually early morning   Weight history:   Wt Readings from Last 3 Encounters:  04/17/17 173 lb 6.4 oz (78.7 kg)  03/29/17 170 lb (77.1 kg)  02/14/17  162 lb 9.6 oz (73.8 kg)    Glycemic control:   Lab Results  Component Value Date   HGBA1C 6.8 04/17/2017   HGBA1C 9.7 12/03/2016   HGBA1C 7.6 (H) 05/19/2014   Lab Results  Component Value Date   MICROALBUR <0.7 02/14/2017   CREATININE 1.57 (H) 02/28/2017   Lab Results  Component Value Date   MICRALBCREAT 0.5 02/14/2017   Microalbumin ratio 9 in 12/2015  Lab Results  Component Value Date   FRUCTOSAMINE 351 (H) 02/14/2017      Allergies as of 04/17/2017      Reactions   Lasix [furosemide] Other (See Comments)   Constipation      Medication List        Accurate as of 04/17/17 12:36 PM. Always use your most recent med list.          ACCU-CHEK SOFTCLIX LANCET DEV Kit   aspirin 81 MG tablet Take 81 mg by mouth daily.   CENTRUM SILVER ADULT 50+ PO Take by mouth.   glucose blood test strip Commonly known as:  ACCU-CHEK AVIVA PLUS Check blood glucose twice daily Dx code E11.65   glucose blood test strip Commonly known as:   ACCU-CHEK GUIDE Use to check blood sugars 2 times daily   insulin aspart protamine - aspart (70-30) 100 UNIT/ML FlexPen Commonly known as:  NOVOLOG MIX 70/30 FLEXPEN Take 10 units before breakfast and 6 units before supper. Dx Code E11.65   losartan 50 MG tablet Commonly known as:  COZAAR Take 1 tablet (50 mg total) by mouth daily.   metFORMIN 1000 MG tablet Commonly known as:  GLUCOPHAGE Take 1,000 mg by mouth 2 (two) times daily with a meal.   Pen Needles 31G X 5 MM Misc 1 Tube by Does not apply route 2 (two) times daily. Dx. E11.65   pioglitazone 15 MG tablet Commonly known as:  ACTOS Take 15 mg by mouth daily.   simvastatin 40 MG tablet Commonly known as:  ZOCOR Take 1 tablet (40 mg total) by mouth every evening.   torsemide 20 MG tablet Commonly known as:  DEMADEX Take 0.5 tablets (10 mg total) by mouth as needed.       Allergies:  Allergies  Allergen Reactions  . Lasix [Furosemide] Other (See Comments)    Constipation    Past Medical History:  Diagnosis Date  . Allergy   . Anemia   . Asthma   . BPH (benign prostatic hyperplasia)   . CAD (coronary artery disease)    a. s/p MI 09/1993;  b. known CTO of RCA;  c. 06/2014 Cath: LM short/mod dzs, LAD 95ost, LCX nl, RI nl, RCA 100, EF 55%;  c. 06/2014 s/p CABG x 4: LIMA->LAD, VG->D1, VG->OM1, VG->Acute Marginal.  . Carotid stenosis    a. Carotid US 6/01:  RICA 09-32%; LICA 35-57% >> FU 1 year  . CKD (chronic kidney disease), stage III ([place])   . Constipation    in the past 2-3 weeks  . Diabetes ([place])    AODM  . Diastolic dysfunction    a. 05/2015 Echo: EF 60-65%, LVH, Gr 1 DD, mild MR, mod dil RA/LA, mod TR, PASP 57mHg.  . Essential hypertension   . GERD (gastroesophageal reflux disease)   . Hyperlipidemia     Past Surgical History:  Procedure Laterality Date  . ANGIOPLASTY  09/22/1993   POBA of RCA (Dr. RMarella Chimes  . BACK SURGERY  2000   back fusion - Dr. HEarnie Larsson .  CARDIAC CATHETERIZATION   11/28/1998   "silent occlusion" of RCA w/collaterals from left coronary system, prox LAD w/40-50% eccentric narrowing, 1st diagonal with 70-80% eccentric narrowing, 85% narrowing of prox small OM1 (Dr. Marella Chimes)  . CAROTID DOPPLER  07/2011   left subclavian (50-69%); right bulb (0-49%); RICA (normal); left mid-distal CCA (0-49%); left bulb/prox ICA (50-69%); left vertebral with abnormal antegrade flow  . NASAL SINUS SURGERY  2010  . NM MYOCAR PERF WALL MOTION  09/19/2009   bruce myoview - mild perfusion defect in basal inferior region (infarct/scar), EF 60%, low risk scan  . RENAL DOPPLER  10/2011   SMA w/ 70-99% diameter reduction & high grade stenosis; R&L renals w/narrowing and increased velocities (60-99%), R kidney smaller than L  . TRANSTHORACIC ECHOCARDIOGRAM  10/20/2012   EF 55-60%, moderate concentric hypertrophy, ventricular septum thickness increased, calcified MV annulus  . VASECTOMY  1963    Family History  Problem Relation Age of Onset  . Uterine cancer Mother   . Stroke Father   . Diabetes Sister   . Colon polyps Brother   . Heart disease Brother   . Colon cancer Neg Hx   . Esophageal cancer Neg Hx   . Rectal cancer Neg Hx   . Stomach cancer Neg Hx     Social History:  reports that he quit smoking about 44 years ago. His smoking use included cigarettes. His smokeless tobacco use includes snuff. He reports that he drinks about 1.8 oz of alcohol per week. He reports that he does not use drugs.   Review of Systems   Lipid history: Most recent LDL 62 in July   No results found for: CHOL, HDL, LDLCALC, LDLDIRECT, TRIG, CHOLHDL         Hypertension: He says his blood pressure has been running higher, as much as 938 systolic at home However has not discussed with PCP  Currently only taking 50 mg of losartan from his cardiologist  Most recent eye exam was 10/10  Most recent foot exam: 8/18    LABS:  Office Visit on 04/17/2017  Component Date Value Ref Range  Status  . Hemoglobin A1C 04/17/2017 6.8   Final    Physical Examination:  BP (!) 148/58   Pulse (!) 58   Ht '5\' 10"'$  (1.778 m)   Wt 173 lb 6.4 oz (78.7 kg)   SpO2 98%   BMI 24.88 kg/m       ASSESSMENT:  Diabetes type 2, uncontrolled with A1c 9.7 recently, current BMI about 22    See history of present illness for detailed discussion of current diabetes management, blood sugar patterns and problems identified  [name] is excellent He subjectively doing well also with small doses of insulin twice a day However his blood sugars do fluctuate some and are periodically high at suppertime when he does have a larger meal at lunch which he is not covering with insulin Also not checking readings after meals as directed  Although his fasting readings are fairly good he occasionally has higher readings and had a couple of readings in the 60s without symptoms  Mild hypertension: Blood pressure is not Controlled and needs to follow-up with his cardiologist   PLAN:      Start Checking blood sugars more consistently 2-3 hours after breakfast and supper.  His meter was programmed the right date and time  If he is planning to eat a sandwich or other starchy food at lunchtime he will take 45 units of  insulin at lunch  No change in insulin otherwise   Continue Actos and metformin  Follow instructions given by dietitian   Patient Instructions  Start Checking blood sugars more consistently 2-3 hours after breakfast and supper.   Check blood sugars on waking up  Every 2 days  Also check blood sugars about 2 hours after a meal and do this after different meals by rotation  Recommended blood sugar levels on waking up is 90-130 and about 2 hours after meal is 130-160  Please bring your blood sugar monitor to each visit, thank you  If eating starchy or hi fat meals at lunch take 4-5 units insulin   Counseling time on subjects discussed in assessment and plan sections is over 50% of  today's 25 minute visit   Staceyann Knouff 04/17/2017, 12:36 PM   Note: This office note was prepared with Dragon voice recognition system technology. Any transcriptional errors that result from this process are unintentional.

## 2017-04-17 ENCOUNTER — Ambulatory Visit: Payer: Medicare HMO | Admitting: Endocrinology

## 2017-04-17 ENCOUNTER — Encounter: Payer: Self-pay | Admitting: Endocrinology

## 2017-04-17 VITALS — BP 148/58 | HR 58 | Ht 70.0 in | Wt 173.4 lb

## 2017-04-17 DIAGNOSIS — E1165 Type 2 diabetes mellitus with hyperglycemia: Secondary | ICD-10-CM

## 2017-04-17 DIAGNOSIS — E782 Mixed hyperlipidemia: Secondary | ICD-10-CM | POA: Diagnosis not present

## 2017-04-17 DIAGNOSIS — Z794 Long term (current) use of insulin: Secondary | ICD-10-CM

## 2017-04-17 LAB — COMPREHENSIVE METABOLIC PANEL
ALT: 16 U/L (ref 0–53)
AST: 19 U/L (ref 0–37)
Albumin: 3.6 g/dL (ref 3.5–5.2)
Alkaline Phosphatase: 56 U/L (ref 39–117)
BUN: 27 mg/dL — ABNORMAL HIGH (ref 6–23)
CO2: 29 mEq/L (ref 19–32)
Calcium: 9.5 mg/dL (ref 8.4–10.5)
Chloride: 104 mEq/L (ref 96–112)
Creatinine, Ser: 1.53 mg/dL — ABNORMAL HIGH (ref 0.40–1.50)
GFR: 46.93 mL/min — ABNORMAL LOW (ref >60.00)
Glucose, Bld: 92 mg/dL (ref 70–99)
Potassium: 4.4 mEq/L (ref 3.5–5.1)
Sodium: 140 mEq/L (ref 135–145)
Total Bilirubin: 0.4 mg/dL (ref 0.2–1.2)
Total Protein: 6.9 g/dL (ref 6.0–8.3)

## 2017-04-17 LAB — LDL CHOLESTEROL, DIRECT: Direct LDL: 45 mg/dL

## 2017-04-17 LAB — POCT GLYCOSYLATED HEMOGLOBIN (HGB A1C): Hemoglobin A1C: 6.8

## 2017-04-17 NOTE — Patient Instructions (Addendum)
Start Checking blood sugars more consistently 2-3 hours after breakfast and supper.   Check blood sugars on waking up  Every 2 days  Also check blood sugars about 2 hours after a meal and do this after different meals by rotation  Recommended blood sugar levels on waking up is 90-130 and about 2 hours after meal is 130-160  Please bring your blood sugar monitor to each visit, thank you  If eating starchy or hi fat meals at lunch take 4-5 units insulin

## 2017-04-18 NOTE — Telephone Encounter (Signed)
Rec'd from [place] @ [facility] forwarded 49 pages to Vivi Barrack MD

## 2017-05-01 ENCOUNTER — Encounter: Payer: Self-pay | Admitting: Family Medicine

## 2017-05-27 ENCOUNTER — Other Ambulatory Visit: Payer: Self-pay | Admitting: Endocrinology

## 2017-06-05 ENCOUNTER — Telehealth: Payer: Self-pay | Admitting: Endocrinology

## 2017-06-05 ENCOUNTER — Other Ambulatory Visit: Payer: Self-pay

## 2017-06-05 MED ORDER — PEN NEEDLES 31G X 5 MM MISC: 1.0000 | Freq: Two times a day (BID) | 1 refills | Status: DC

## 2017-06-05 NOTE — Telephone Encounter (Signed)
Insulin Pen Needle (PEN NEEDLES) 31G X 5 MM MISC   Pts daughter states Justin Salinas is suppose to be sending over so we can send it back in for his refills.  Pts daughter is calling to see if we have received that paper. Pt is running low on scripts  Please advise.

## 2017-06-05 NOTE — Telephone Encounter (Signed)
I have not seen the paperwork but I did send in a prescription to [facility] for pen needles as requested.

## 2017-06-06 ENCOUNTER — Other Ambulatory Visit: Payer: Self-pay

## 2017-06-06 MED ORDER — ACCU-CHEK SOFTCLIX LANCETS MISC: 3 refills | Status: DC

## 2017-06-06 MED ORDER — METFORMIN HCL 1000 MG PO TABS: 1000.0000 mg | ORAL_TABLET | Freq: Two times a day (BID) | ORAL | 3 refills | Status: DC

## 2017-06-06 MED ORDER — PIOGLITAZONE HCL 15 MG PO TABS: 15.0000 mg | ORAL_TABLET | Freq: Every day | ORAL | 3 refills | Status: DC

## 2017-07-04 ENCOUNTER — Other Ambulatory Visit: Payer: Self-pay

## 2017-07-04 ENCOUNTER — Ambulatory Visit: Payer: Medicare HMO | Admitting: Family Medicine

## 2017-07-04 ENCOUNTER — Encounter: Payer: Self-pay | Admitting: Family Medicine

## 2017-07-04 VITALS — BP 142/78 | HR 56 | Temp 98.4°F | Ht 70.0 in | Wt 176.6 lb

## 2017-07-04 DIAGNOSIS — I10 Essential (primary) hypertension: Secondary | ICD-10-CM | POA: Diagnosis not present

## 2017-07-04 DIAGNOSIS — N400 Enlarged prostate without lower urinary tract symptoms: Secondary | ICD-10-CM

## 2017-07-04 DIAGNOSIS — R05 Cough: Secondary | ICD-10-CM | POA: Diagnosis not present

## 2017-07-04 DIAGNOSIS — E782 Mixed hyperlipidemia: Secondary | ICD-10-CM | POA: Diagnosis not present

## 2017-07-04 DIAGNOSIS — E1165 Type 2 diabetes mellitus with hyperglycemia: Secondary | ICD-10-CM | POA: Diagnosis not present

## 2017-07-04 DIAGNOSIS — R059 Cough, unspecified: Secondary | ICD-10-CM

## 2017-07-04 MED ORDER — BENZONATATE 200 MG PO CAPS: 200.0000 mg | ORAL_CAPSULE | Freq: Two times a day (BID) | ORAL | 0 refills | Status: DC | PRN

## 2017-07-04 MED ORDER — AZITHROMYCIN 250 MG PO TABS: ORAL_TABLET | ORAL | 0 refills | Status: DC

## 2017-07-04 MED ORDER — IPRATROPIUM BROMIDE 0.06 % NA SOLN
2.0000 | Freq: Four times a day (QID) | NASAL | 0 refills | Status: DC
Start: 2017-07-04 — End: 2017-09-10

## 2017-07-04 MED ORDER — TRUEPLUS LANCETS 28G MISC: 4 refills | Status: DC

## 2017-07-04 NOTE — Assessment & Plan Note (Signed)
Continue simvastatin.  Will obtain records from previous PCP.  Will need lipid panel at next visit if has not been done within the last year.

## 2017-07-04 NOTE — Patient Instructions (Signed)
Start the atrovent.  Start tessalon for your cough.  Start the zpack if your symptoms worsen or do not improve in a few days.  Please stay well hydrated.  You can take tylenol and/or motrin as needed for low grade fever and pain.  Please let me know if your symptoms worsen or fail to improve.  I will also place a urology referral.  Come back to seem in 6 months, or sooner as needed.  Take care, Dr [name]

## 2017-07-04 NOTE — Assessment & Plan Note (Signed)
Stable.  Referral to urology placed per patient request.

## 2017-07-04 NOTE — Assessment & Plan Note (Signed)
Stable.  Continue losartan.  Will obtain records from previous PCP.  Will need bmet if not done within the last year.

## 2017-07-04 NOTE — Progress Notes (Signed)
Subjective:  Justin Salinas is a 77 y.o. male who presents today with a chief complaint of cough and to establish care.   HPI:  Cough, Acute Issue Started 4-5 days ago. Worsened over that time. Associated with sinus pressure congestion, rhinorrhea, and sore throat. No fevers or chills. Tried taking zicam which has not helped. No sick contacts. No other obvious alleviating or aggravating factors.   BPH, new problem Several year history.  Previously followed by urology in [place].  His urologist has retired and he would like referral to a urologist in [place].  ROS: Per HPI, otherwise a 10 point review of systems was performed and was negative  PMH:  The following were reviewed and entered/updated in epic: Past Medical History:  Diagnosis Date  . Allergy   . Anemia   . Asthma   . BPH (benign prostatic hyperplasia)   . CAD (coronary artery disease)    a. s/p MI 09/1993;  b. known CTO of RCA;  c. 06/2014 Cath: LM short/mod dzs, LAD 95ost, LCX nl, RI nl, RCA 100, EF 55%;  c. 06/2014 s/p CABG x 4: LIMA->LAD, VG->D1, VG->OM1, VG->Acute Marginal.  . Carotid stenosis    a. Carotid US 1/66:  RICA 06-00%; LICA 45-99% >> FU 1 year  . CKD (chronic kidney disease), stage III ([place])   . Constipation    in the past 2-3 weeks  . Diabetes ([place])    AODM  . Diastolic dysfunction    a. 05/2015 Echo: EF 60-65%, LVH, Gr 1 DD, mild MR, mod dil RA/LA, mod TR, PASP 58mHg.  . Essential hypertension   . GERD (gastroesophageal reflux disease)   . Hyperlipidemia    Patient Active Problem List   Diagnosis Date Noted  . Uncontrolled type 2 diabetes mellitus with hyperglycemia, without long-term current use of insulin ([place] 01/03/2017  . Diastolic dysfunction   . Essential hypertension   . CKD (chronic kidney disease), stage III ([place]   . S/P CABG x 4 05/21/2014  . Ischemic chest pain 05/19/2014  . Chest pain with moderate risk for cardiac etiology 02/12/2013  . Chronic renal  insufficiency, stage III (moderate) ([facility] 02/12/2013  . Carotid artery disease ([facility] 12/01/2007  . DEGENERATIVE DISC DISEASE 12/01/2007  . BPH (benign prostatic hyperplasia) 12/01/2007  . DM 11/28/2007  . HLD (hyperlipidemia) 11/28/2007  . CAD (coronary artery disease) 11/28/2007  . GERD 11/28/2007  . DEGENERATIVE JOINT DISEASE 11/28/2007   Past Surgical History:  Procedure Laterality Date  . ANGIOPLASTY  09/22/1993   POBA of RCA (Dr. RMarella Chimes  . BACK SURGERY  2000   back fusion - Dr. HEarnie Larsson . CARDIAC CATHETERIZATION  11/28/1998   "silent occlusion" of RCA w/collaterals from left coronary system, prox LAD w/40-50% eccentric narrowing, 1st diagonal with 70-80% eccentric narrowing, 85% narrowing of prox small OM1 (Dr. RMarella Chimes  . CAROTID DOPPLER  07/2011   left subclavian (50-69%); right bulb (0-49%); RICA (normal); left mid-distal CCA (0-49%); left bulb/prox ICA (50-69%); left vertebral with abnormal antegrade flow  . CORONARY ARTERY BYPASS GRAFT N/A 05/21/2014   Procedure: CORONARY ARTERY BYPASS GRAFTING (CABG) x  four, using left internal mammary artery and right leg greater saphenous vein harvested endoscopically;  Surgeon: SMelrose Nakayama MD;  [facility]  Service: Open Heart Surgery;  Laterality: N/A;  . INTRAOPERATIVE TRANSESOPHAGEAL ECHOCARDIOGRAM N/A 05/21/2014   Procedure: INTRAOPERATIVE TRANSESOPHAGEAL ECHOCARDIOGRAM;  Surgeon: SMelrose Nakayama MD;  [place] [facility]  Service: Open Heart Surgery;  Laterality: N/A;  . LEFT HEART CATHETERIZATION WITH CORONARY ANGIOGRAM N/A 05/19/2014   Procedure: LEFT HEART CATHETERIZATION WITH CORONARY ANGIOGRAM;  Surgeon: Blane Ohara, MD;  Location[facility] CATH LAB;  Service: Cardiovascular;  Laterality: N/A;  . NASAL SINUS SURGERY  2010  . NM MYOCAR PERF WALL MOTION  09/19/2009   bruce myoview - mild perfusion defect in basal inferior region (infarct/scar), EF 60%, low risk scan  . RENAL DOPPLER  10/2011   SMA w/  70-99% diameter reduction & high grade stenosis; R&L renals w/narrowing and increased velocities (60-99%), R kidney smaller than L  . TRANSTHORACIC ECHOCARDIOGRAM  10/20/2012   EF 55-60%, moderate concentric hypertrophy, ventricular septum thickness increased, calcified MV annulus  . VASECTOMY  1963   Family History  Problem Relation Age of Onset  . Uterine cancer Mother   . Stroke Father   . Diabetes Sister   . Colon polyps Brother   . Heart disease Brother   . Colon cancer Neg Hx   . Esophageal cancer Neg Hx   . Rectal cancer Neg Hx   . Stomach cancer Neg Hx     Medications- reviewed and updated Current Outpatient Medications  Medication Sig Dispense Refill  . ACCU-CHEK SOFTCLIX LANCETS lancets Use to rest blood sugar 3 times daily- DX code E11.65 100 each 3  . aspirin 81 MG tablet Take 81 mg by mouth daily.    Marland Kitchen glucose blood (ACCU-CHEK AVIVA PLUS) test strip Check blood glucose twice daily Dx code E11.65 100 each 2  . glucose blood (ACCU-CHEK GUIDE) test strip Use to check blood sugars 2 times daily 50 each 2  . insulin aspart protamine - aspart (NOVOLOG MIX 70/30 FLEXPEN) (70-30) 100 UNIT/ML FlexPen INJECT 10 UNITS BEFORE BREAKFAST AND 6 UNITS BEFORE SUPPER (PEN EXPIRES 14 DAYS AFTER OPENING) 30 mL 1  . Insulin Pen Needle (PEN NEEDLES) 31G X 5 MM MISC 1 Tube by Does not apply route 2 (two) times daily. Dx. E11.65 100 each 1  . Lancets Misc. (ACCU-CHEK SOFTCLIX LANCET DEV) KIT     . metFORMIN (GLUCOPHAGE) 1000 MG tablet Take 1 tablet (1,000 mg total) by mouth 2 (two) times daily with a meal. 180 tablet 3  . Multiple Vitamins-Minerals (CENTRUM SILVER ADULT 50+ PO) Take by mouth.    . pioglitazone (ACTOS) 15 MG tablet Take 1 tablet (15 mg total) by mouth daily. 90 tablet 3  . simvastatin (ZOCOR) 40 MG tablet Take 1 tablet (40 mg total) by mouth every evening. 30 tablet 6  . azithromycin (ZITHROMAX) 250 MG tablet Take 2 tabs day 1, then 1 tab daily 6 each 0  . benzonatate (TESSALON)  200 MG capsule Take 1 capsule (200 mg total) by mouth 2 (two) times daily as needed for cough. 20 capsule 0  . ipratropium (ATROVENT) 0.06 % nasal spray Place 2 sprays into both nostrils 4 (four) times daily. 15 mL 0  . losartan (COZAAR) 50 MG tablet Take 1 tablet (50 mg total) by mouth daily. 90 tablet 3  . torsemide (DEMADEX) 20 MG tablet Take 0.5 tablets (10 mg total) by mouth as needed. (Patient not taking: Reported on 03/29/2017)     No current facility-administered medications for this visit.     Allergies-reviewed and updated Allergies  Allergen Reactions  . Lasix [Furosemide] Other (See Comments)    Constipation    Social History   Socioeconomic History  . Marital status: Married    Spouse name: None  . Number of children:  2  . Years of education: None  . Highest education level: None  Social Needs  . Financial resource strain: None  . Food insecurity - worry: None  . Food insecurity - inability: None  . Transportation needs - medical: None  . Transportation needs - non-medical: None  Occupational History  . None  Tobacco Use  . Smoking status: Former Smoker    Types: Cigarettes    Last attempt to quit: 06/04/1972    Years since quitting: 45.1  . Smokeless tobacco: Current User    Types: Snuff  . Tobacco comment: quit about 40 years  Substance and Sexual Activity  . Alcohol use: Yes    Alcohol/week: 1.8 oz    Types: 3 Standard drinks or equivalent per week    Comment: a glass of red wine 3 or 4 days a week.  . Drug use: No  . Sexual activity: None  Other Topics Concern  . None  Social History Narrative  . None   Objective:  Physical Exam: BP (!) 142/78 (BP Location: Left Arm, Patient Position: Sitting, Cuff Size: Normal)   Pulse (!) 56   Temp 98.4 F (36.9 C) (Oral)   Ht '5\' 10"'$  (1.778 m)   Wt 176 lb 9.6 oz (80.1 kg)   SpO2 97%   BMI 25.34 kg/m   Gen: NAD, resting comfortably HEENT: TMs are clear effusion bilaterally.  Oropharynx clear.  No  lymphadenopathy. CV: RRR with no murmurs appreciated Pulm: NWOB, CTAB with no crackles, wheezes, or rhonchi GI: Normal bowel sounds present. Soft, Nontender, Nondistended. MSK: No edema, cyanosis, or clubbing noted Skin: Warm, dry Neuro: Grossly normal, moves all extremities Psych: Normal affect and thought content  Assessment/Plan:  HLD (hyperlipidemia) Continue simvastatin.  Will obtain records from previous PCP.  Will need lipid panel at next visit if has not been done within the last year.  BPH (benign prostatic hyperplasia) Stable.  Referral to urology placed per patient request.  Essential hypertension Stable.  Continue losartan.  Will obtain records from previous PCP.  Will need bmet if not done within the last year.  Cough Likely secondary to viral URI. No signs of bacterial infection. Start atrovent for rhinorrhea/sinus congestion. Will give '80mg'$  IM depo-medrol for sore throat. Start tessalon for cough. Sent in a "pocket prescription" for azithromycin with strict instruction to not start unless symptoms worse or fail to improve within the next several days. Recommended tylenol and/or motrin as needed for low grade fever and pain. Encouraged good oral hydration. Return precautions reviewed. Follow up as needed.   Preventative healthcare Obtain records from previous PCP.  [name]. [name], MD 07/04/2017 12:14 PM

## 2017-07-19 ENCOUNTER — Encounter: Payer: Self-pay | Admitting: Endocrinology

## 2017-07-19 ENCOUNTER — Ambulatory Visit: Payer: Medicare HMO | Admitting: Endocrinology

## 2017-07-19 VITALS — BP 148/80 | HR 57 | Ht 70.0 in | Wt 180.2 lb

## 2017-07-19 DIAGNOSIS — E1165 Type 2 diabetes mellitus with hyperglycemia: Secondary | ICD-10-CM

## 2017-07-19 DIAGNOSIS — Z794 Long term (current) use of insulin: Secondary | ICD-10-CM

## 2017-07-19 LAB — POCT GLYCOSYLATED HEMOGLOBIN (HGB A1C): Hemoglobin A1C: 7

## 2017-07-19 NOTE — Patient Instructions (Signed)
  Testing:  Check blood sugars on waking up  3-4/7  Also check blood sugars about 2 hours after a meal and do this after different meals by rotation  Recommended blood sugar levels on waking up is 90-130 and about 2 hours after meal is 130-160  Please bring your blood sugar monitor to each visit, thank you

## 2017-07-19 NOTE — Progress Notes (Signed)
Patient ID: Justin Salinas, male   DOB: [birth year], 77 y.o.   MRN: [id number]           Reason for Appointment: Follow-up for Type 2 Diabetes    History of Present Illness:          Date of diagnosis of type 2 diabetes mellitus:2000        Background history:   His diabetes had been mild initially and treated with metformin He had subsequently other medications added including Amaryl and Januvia No details are available from PCP office, [name] in 2015 was 7.6   Because of his poor control was started on insulin on 01/03/17.  Recent history:   Insulin regimen: NovoLog mix, 10 units at breakfast and 6 at supper  Non-insulin hypoglycemic drugs the patient is taking are: Metformin 1 g twice a day, Actos 15 mg daily   He had a baseline A1c A1c 9.7, now 6.8   Current management, blood sugar patterns and problems identified:  He has difficulty sometimes remembering to take his insulin and had a high reading of 270 this week around lunchtime possibly from forgetting to take to morning dose  He has been generally checking sugars before breakfast and supper and does not remember to do them after breakfast or evening meal as discussed on each visit  FASTING blood sugars are mostly near normal although sporadically will have a higher reading  His evening readings are more variable before dinnertime and slightly higher than average from comparison  He again says that he may sometimes have difficulty consistently watching his diet even though he has a meal plan from the dietitian  He may not have exercise as frequently as before but trying to go to the gym morning  He will sometimes increase his insulin by 2 or 3 units if his blood sugars are high before eating or he is planning to eat a large meal  HYPOGLYCEMIA: None recently  His weight has been going up   However he thinks he feels good at this weight  His meal planning was reviewed by dietitian         Side effects from  medications have been: None  Compliance with the medical regimen: Good  Glucose monitoring:  done 2 times a day         Glucometer:  Accu-Chek guide .      Blood Glucose readings by download:  Mean values apply above for all meters except median for One Touch  PRE-MEAL Fasting Lunch Dinner Bedtime Overall  Glucose range: 85-162   93-227  187    Mean/median: 124   150   143    POST-MEAL PC Breakfast PC Lunch PC Dinner  Glucose range:     Mean/median:      Previous blood sugars:  Mean values apply above for all meters except median for One Touch  PRE-MEAL Fasting Lunch Dinner Bedtime Overall  Glucose range: 64-175    99-222     Mean/median: 129   135   134     Self-care: The diet that the patient has been following is: tries to limit sweets, fried food       Typical meal intake: Breakfast is eggs meat, toast.  Lunch: Sandwich or.Crackers with or without peanut butter usually                Dietician visit, most recent: 03/2017  Exercise:   walking or other exercise at the gym 3/7 days for at least 40 min, usually early morning   Weight history:   Wt Readings from Last 3 Encounters:  07/19/17 180 lb 3.2 oz (81.7 kg)  07/04/17 176 lb 9.6 oz (80.1 kg)  04/17/17 173 lb 6.4 oz (78.7 kg)    Glycemic control:   Lab Results  Component Value Date   HGBA1C 6.8 04/17/2017   HGBA1C 9.7 12/03/2016   HGBA1C 7.6 (H) 05/19/2014   Lab Results  Component Value Date   MICROALBUR <0.7 02/14/2017   CREATININE 1.53 (H) 04/17/2017   Lab Results  Component Value Date   MICRALBCREAT 0.5 02/14/2017   Microalbumin ratio 9 in 12/2015  Lab Results  Component Value Date   FRUCTOSAMINE 351 (H) 02/14/2017      Allergies as of 07/19/2017      Reactions   Lasix [furosemide] Other (See Comments)   Constipation      Medication List        Accurate as of 07/19/17  9:47 AM. Always use your most recent med list.          ACCU-CHEK SOFTCLIX LANCET DEV Kit     ACCU-CHEK SOFTCLIX LANCETS lancets Use to rest blood sugar 3 times daily- DX code E11.65   TRUEPLUS LANCETS 28G Misc Use to test blood sugar 3 times daily   aspirin 81 MG tablet Take 81 mg by mouth daily.   azithromycin 250 MG tablet Commonly known as:  ZITHROMAX Take 2 tabs day 1, then 1 tab daily   benzonatate 200 MG capsule Commonly known as:  TESSALON Take 1 capsule (200 mg total) by mouth 2 (two) times daily as needed for cough.   CENTRUM SILVER ADULT 50+ PO Take by mouth.   glucose blood test strip Commonly known as:  ACCU-CHEK AVIVA PLUS Check blood glucose twice daily Dx code E11.65   glucose blood test strip Commonly known as:  ACCU-CHEK GUIDE Use to check blood sugars 2 times daily   insulin aspart protamine - aspart (70-30) 100 UNIT/ML FlexPen Commonly known as:  NOVOLOG MIX 70/30 FLEXPEN INJECT 10 UNITS BEFORE BREAKFAST AND 6 UNITS BEFORE SUPPER (PEN EXPIRES 14 DAYS AFTER OPENING)   ipratropium 0.06 % nasal spray Commonly known as:  ATROVENT Place 2 sprays into both nostrils 4 (four) times daily.   losartan 50 MG tablet Commonly known as:  COZAAR Take 1 tablet (50 mg total) by mouth daily.   metFORMIN 1000 MG tablet Commonly known as:  GLUCOPHAGE Take 1 tablet (1,000 mg total) by mouth 2 (two) times daily with a meal.   Pen Needles 31G X 5 MM Misc 1 Tube by Does not apply route 2 (two) times daily. Dx. E11.65   pioglitazone 15 MG tablet Commonly known as:  ACTOS Take 1 tablet (15 mg total) by mouth daily.   simvastatin 40 MG tablet Commonly known as:  ZOCOR Take 1 tablet (40 mg total) by mouth every evening.   torsemide 20 MG tablet Commonly known as:  DEMADEX Take 0.5 tablets (10 mg total) by mouth as needed.       Allergies:  Allergies  Allergen Reactions  . Lasix [Furosemide] Other (See Comments)    Constipation    Past Medical History:  Diagnosis Date  . Allergy   . Anemia   . Asthma   . BPH (benign prostatic hyperplasia)    . CAD (coronary artery disease)    a. s/p MI 09/1993;  b. known CTO of RCA;  c. 06/2014 Cath: LM short/mod dzs, LAD 95ost, LCX nl, RI nl, RCA 100, EF 55%;  c. 06/2014 s/p CABG x 4: LIMA->LAD, VG->D1, VG->OM1, VG->Acute Marginal.  . Carotid stenosis    a. Carotid US 4/82:  RICA 50-03%; LICA 70-48% >> FU 1 year  . CKD (chronic kidney disease), stage III ([place])   . Constipation    in the past 2-3 weeks  . Diabetes ([place])    AODM  . Diastolic dysfunction    a. 05/2015 Echo: EF 60-65%, LVH, Gr 1 DD, mild MR, mod dil RA/LA, mod TR, PASP 60mHg.  . Essential hypertension   . GERD (gastroesophageal reflux disease)   . Hyperlipidemia     Past Surgical History:  Procedure Laterality Date  . ANGIOPLASTY  09/22/1993   POBA of RCA (Dr. RMarella Chimes  . BACK SURGERY  2000   back fusion - Dr. HEarnie Larsson . CARDIAC CATHETERIZATION  11/28/1998   "silent occlusion" of RCA w/collaterals from left coronary system, prox LAD w/40-50% eccentric narrowing, 1st diagonal with 70-80% eccentric narrowing, 85% narrowing of prox small OM1 (Dr. RMarella Chimes  . CAROTID DOPPLER  07/2011   left subclavian (50-69%); right bulb (0-49%); RICA (normal); left mid-distal CCA (0-49%); left bulb/prox ICA (50-69%); left vertebral with abnormal antegrade flow  . CORONARY ARTERY BYPASS GRAFT N/A 05/21/2014   Procedure: CORONARY ARTERY BYPASS GRAFTING (CABG) x  four, using left internal mammary artery and right leg greater saphenous vein harvested endoscopically;  Surgeon: SMelrose Nakayama MD;  [facility]  Service: Open Heart Surgery;  Laterality: N/A;  . INTRAOPERATIVE TRANSESOPHAGEAL ECHOCARDIOGRAM N/A 05/21/2014   Procedure: INTRAOPERATIVE TRANSESOPHAGEAL ECHOCARDIOGRAM;  Surgeon: SMelrose Nakayama MD;  [facility]  Service: Open Heart Surgery;  Laterality: N/A;  . LEFT HEART CATHETERIZATION WITH CORONARY ANGIOGRAM N/A 05/19/2014   Procedure: LEFT HEART CATHETERIZATION WITH CORONARY ANGIOGRAM;  Surgeon: MBlane Ohara MD;  [facility];  Service: Cardiovascular;  Laterality: N/A;  . NASAL SINUS SURGERY  2010  . NM MYOCAR PERF WALL MOTION  09/19/2009   bruce myoview - mild perfusion defect in basal inferior region (infarct/scar), EF 60%, low risk scan  . RENAL DOPPLER  10/2011   SMA w/ 70-99% diameter reduction & high grade stenosis; R&L renals w/narrowing and increased velocities (60-99%), R kidney smaller than L  . TRANSTHORACIC ECHOCARDIOGRAM  10/20/2012   EF 55-60%, moderate concentric hypertrophy, ventricular septum thickness increased, calcified MV annulus  . VASECTOMY  1963    Family History  Problem Relation Age of Onset  . Uterine cancer Mother   . Stroke Father   . Diabetes Sister   . Colon polyps Brother   . Heart disease Brother   . Colon cancer Neg Hx   . Esophageal cancer Neg Hx   . Rectal cancer Neg Hx   . Stomach cancer Neg Hx     Social History:  reports that he quit smoking about 45 years ago. His smoking use included cigarettes. His smokeless tobacco use includes snuff. He reports that he drinks about 1.8 oz of alcohol per week. He reports that he does not use drugs.   Review of Systems   Lipid history: Most recent LDL:     Lab Results  Component Value Date   LDLDIRECT 45.0 04/17/2017           Hypertension: Blood pressure appears to be relatively high He does not monitor at home now  Currently only taking 50 mg of losartan from his cardiologist  BP Readings from Last 3 Encounters:  07/19/17 (!) 148/80  07/04/17 (!) 142/78  04/17/17 (!) 148/58   Renal function has been variable:  Lab Results  Component Value Date   CREATININE 1.53 (H) 04/17/2017   CREATININE 1.57 (H) 02/28/2017   CREATININE 1.76 (H) 02/14/2017     Most recent eye exam was 10/17  Most recent foot exam: 8/18  He is asking about diabetic shoes but does not have any neuropathy  LABS:  No visits with results within 1 Week(s) from this visit.  Latest known visit with  results is:  Abstract on 05/01/2017  Component Date Value Ref Range Status  . HM Diabetic Eye Exam 02/16/2016 No Retinopathy  No Retinopathy Final    Physical Examination:  BP (!) 148/80 (BP Location: Left Arm, Patient Position: Sitting, Cuff Size: Normal)   Pulse (!) 57   Ht '5\' 10"'$  (1.778 m)   Wt 180 lb 3.2 oz (81.7 kg)   SpO2 97%   BMI 25.86 kg/m       Diabetic Foot Exam - Simple   Simple Foot Form Diabetic Foot exam was performed with the following findings:  Yes 07/19/2017  8:48 AM  Visual Inspection No deformities, no ulcerations, no other skin breakdown bilaterally:  Yes Sensation Testing Intact to touch and monofilament testing bilaterally:  Yes Pulse Check Posterior Tibialis and Dorsalis pulse intact bilaterally:  Yes Comments    No edema present  ASSESSMENT:  Diabetes type 2, insulin requiring  See history of present illness for detailed discussion of current diabetes management, blood sugar patterns and problems identified  [name] is 7% although was better at 6.8 on the last visit He is gaining little weight and may require more insulin to cover his meals but difficult to assess this since he does not monitor after eating at all His BMI is still fairly good at 26  Mild hypertension: Blood pressure is not adequately controlled and advised him to follow-up with cardiologist, most likely he should benefit from a higher dose of losartan  Discussed that he does not have any evidence of neuropathy and will not qualify for diabetic shoes  Mild CKD: Creatinine is slightly better  Diuretic use: Discussed that his weight gain is related to insulin and improve diabetes control and he does not need to take Demadex unless he has swelling  PLAN:     He was again reminded to check blood sugars after meals more often and not just before eating  If he finds his blood sugars to be higher in the afternoon may consider adding insulin at lunch based on his  diet  Consistent exercise  Advised him to cut down his higher calorie foods to prevent further weight gain, discussed ideal weight  Explained to him the nature of premixed insulin and that we need to cover his meals also, this can be easier to assess with more monitoring and modification of diet if needed   No change in insulin   Since his creatinine clearance is 47 and relatively better will continue the same dose of metformin   Continue Actos    Patient Instructions   Testing:  Check blood sugars on waking up  3-4/7  Also check blood sugars about 2 hours after a meal and do this after different meals by rotation  Recommended blood sugar levels on waking up is 90-130 and about 2 hours after meal is 130-160  Please  bring your blood sugar monitor to each visit, thank you    Total visit time for evaluation and management of multiple problems, counseling, exam and review of labs = 25 minutes  Elayne Snare 07/19/2017, 9:47 AM   Note: This office note was prepared with Dragon voice recognition system technology. Any transcriptional errors that result from this process are unintentional.

## 2017-07-21 MED ORDER — TRUEPLUS LANCETS 28G MISC: 4 refills | Status: DC

## 2017-07-21 NOTE — Addendum Note (Signed)
Addended by: Elayne Snare on: 07/21/2017 02:01 PM   Modules accepted: Orders

## 2017-08-13 ENCOUNTER — Other Ambulatory Visit: Payer: Self-pay | Admitting: Endocrinology

## 2017-08-29 DIAGNOSIS — R3911 Hesitancy of micturition: Secondary | ICD-10-CM | POA: Diagnosis not present

## 2017-08-29 DIAGNOSIS — R3915 Urgency of urination: Secondary | ICD-10-CM | POA: Diagnosis not present

## 2017-08-29 DIAGNOSIS — N401 Enlarged prostate with lower urinary tract symptoms: Secondary | ICD-10-CM | POA: Diagnosis not present

## 2017-09-04 ENCOUNTER — Telehealth: Payer: Self-pay | Admitting: Endocrinology

## 2017-09-04 NOTE — Telephone Encounter (Signed)
simvastatin (ZOCOR) 40 MG tablet  Justin Salinas with [facility] is following up on a fax that was sent to [place] on 3/26  [phone] Order # [id number]   Fax [phone]

## 2017-09-05 ENCOUNTER — Telehealth: Payer: Self-pay | Admitting: Endocrinology

## 2017-09-05 NOTE — Telephone Encounter (Signed)
Patients daughter states that the PCP has prescribed simvastatin (ZOCOR) 40 MG tablet. That doctor has recently left the practice and they would like to know if Dr Dwyane Dee can send in a new prescription for this.   [place], [place]

## 2017-09-06 ENCOUNTER — Other Ambulatory Visit: Payer: Self-pay

## 2017-09-06 MED ORDER — SIMVASTATIN 40 MG PO TABS: 40.0000 mg | ORAL_TABLET | Freq: Every evening | ORAL | 3 refills | Status: DC

## 2017-09-06 NOTE — Telephone Encounter (Signed)
Refill resubmitted today.

## 2017-09-06 NOTE — Telephone Encounter (Signed)
Done

## 2017-09-06 NOTE — Telephone Encounter (Signed)
ok 

## 2017-09-06 NOTE — Telephone Encounter (Signed)
Can I order this please advise

## 2017-09-09 ENCOUNTER — Other Ambulatory Visit: Payer: Self-pay

## 2017-09-09 DIAGNOSIS — E1165 Type 2 diabetes mellitus with hyperglycemia: Secondary | ICD-10-CM

## 2017-09-09 MED ORDER — SIMVASTATIN 40 MG PO TABS: 40.0000 mg | ORAL_TABLET | Freq: Every evening | ORAL | 3 refills | Status: DC

## 2017-09-10 ENCOUNTER — Ambulatory Visit (INDEPENDENT_AMBULATORY_CARE_PROVIDER_SITE_OTHER): Payer: Medicare HMO | Admitting: Family Medicine

## 2017-09-10 ENCOUNTER — Encounter: Payer: Self-pay | Admitting: Family Medicine

## 2017-09-10 VITALS — BP 136/78 | HR 58 | Temp 97.9°F | Resp 12 | Ht 70.0 in | Wt 176.0 lb

## 2017-09-10 DIAGNOSIS — I10 Essential (primary) hypertension: Secondary | ICD-10-CM

## 2017-09-10 DIAGNOSIS — E1165 Type 2 diabetes mellitus with hyperglycemia: Secondary | ICD-10-CM | POA: Diagnosis not present

## 2017-09-10 DIAGNOSIS — Z0001 Encounter for general adult medical examination with abnormal findings: Secondary | ICD-10-CM

## 2017-09-10 DIAGNOSIS — R4589 Other symptoms and signs involving emotional state: Secondary | ICD-10-CM

## 2017-09-10 DIAGNOSIS — E782 Mixed hyperlipidemia: Secondary | ICD-10-CM

## 2017-09-10 NOTE — Assessment & Plan Note (Signed)
Stable.  Continue current regimen.  Defer further management to endocrinology.

## 2017-09-10 NOTE — Patient Instructions (Signed)
Preventive Care 27 Years and Older, Male Preventive care refers to lifestyle choices and visits with your health care provider that can promote health and wellness. What does preventive care include?  A yearly physical exam. This is also called an annual well check.  Dental exams once or twice a year.  Routine eye exams. Ask your health care provider how often you should have your eyes checked.  Personal lifestyle choices, including: ? Daily care of your teeth and gums. ? Regular physical activity. ? Eating a healthy diet. ? Avoiding tobacco and drug use. ? Limiting alcohol use. ? Practicing safe sex. ? Taking low doses of aspirin every day. ? Taking vitamin and mineral supplements as recommended by your health care provider. What happens during an annual well check? The services and screenings done by your health care provider during your annual well check will depend on your age, overall health, lifestyle risk factors, and family history of disease. Counseling Your health care provider may ask you questions about your:  Alcohol use.  Tobacco use.  Drug use.  Emotional well-being.  Home and relationship well-being.  Sexual activity.  Eating habits.  History of falls.  Memory and ability to understand (cognition).  Work and work Statistician.  Screening You may have the following tests or measurements:  Height, weight, and BMI.  Blood pressure.  Lipid and cholesterol levels. These may be checked every 5 years, or more frequently if you are over 5 years old.  Skin check.  Lung cancer screening. You may have this screening every year starting at age 79 if you have a 30-pack-year history of smoking and currently smoke or have quit within the past 15 years.  Fecal occult blood test (FOBT) of the stool. You may have this test every year starting at age 79.  Flexible sigmoidoscopy or colonoscopy. You may have a sigmoidoscopy every 5 years or a colonoscopy every 10  years starting at age 79.  Prostate cancer screening. Recommendations will vary depending on your family history and other risks.  Hepatitis C blood test.  Hepatitis B blood test.  Sexually transmitted disease (STD) testing.  Diabetes screening. This is done by checking your blood sugar (glucose) after you have not eaten for a while (fasting). You may have this done every 1-3 years.  Abdominal aortic aneurysm (AAA) screening. You may need this if you are a current or former smoker.  Osteoporosis. You may be screened starting at age 79 if you are at high risk.  Talk with your health care provider about your test results, treatment options, and if necessary, the need for more tests. Vaccines Your health care provider may recommend certain vaccines, such as:  Influenza vaccine. This is recommended every year.  Tetanus, diphtheria, and acellular pertussis (Tdap, Td) vaccine. You may need a Td booster every 10 years.  Varicella vaccine. You may need this if you have not been vaccinated.  Zoster vaccine. You may need this after age 79.  Measles, mumps, and rubella (MMR) vaccine. You may need at least one dose of MMR if you were born in 1957 or later. You may also need a second dose.  Pneumococcal 13-valent conjugate (PCV13) vaccine. One dose is recommended after age 79.  Pneumococcal polysaccharide (PPSV23) vaccine. One dose is recommended after age 79.  Meningococcal vaccine. You may need this if you have certain conditions.  Hepatitis A vaccine. You may need this if you have certain conditions or if you travel or work in places where you  may be exposed to hepatitis A.  Hepatitis B vaccine. You may need this if you have certain conditions or if you travel or work in places where you may be exposed to hepatitis B.  Haemophilus influenzae type b (Hib) vaccine. You may need this if you have certain risk factors.  Talk to your health care provider about which screenings and vaccines  you need and how often you need them. This information is not intended to replace advice given to you by your health care provider. Make sure you discuss any questions you have with your health care provider. Document Released: 06/17/2015 Document Revised: 02/08/2016 Document Reviewed: 03/22/2015 Elsevier Interactive Patient Education  2018 [employer]  

## 2017-09-10 NOTE — Assessment & Plan Note (Signed)
Stable.  Continue simvastatin 40 mg daily.  Direct LDL 45 about 5 months ago.

## 2017-09-10 NOTE — Assessment & Plan Note (Signed)
Stable.  Continue losartan. 

## 2017-09-10 NOTE — Progress Notes (Signed)
 Subjective:  Justin Salinas is a 77 y.o. male who presents today for his annual comprehensive physical exam.    HPI:  He has no acute complaints today.   His stable, chronic conditions are outlined below:  1.  Type 2 diabetes.  Current regimen includes Actos 15 mg daily, metformin 1000 mg twice daily, and NovoLog 70 3010 units with breakfast and 6 units with supper.  Tolerates this regimen well without side effects.  Early morning sugars usually in the 120s-130s.  2.  Hypertension.  Current medications include losartan 50 mg daily.  Tolerates this well without side effects.  No chest pain or shortness of breath.  3.  Hyperlipidemia.  On simvastatin 40 mg daily.  Tolerates this well without side effects.   Lifestyle Diet: Balanced. Tries to avoid unhealthy foods Exercise: 3-4 days per week. Very active with yardwork.   Depression screen PHQ 2/9 07/04/2017  Decreased Interest 0  Down, Depressed, Hopeless 0  PHQ - 2 Score 0    Health Maintenance Due  Topic Date Due  . OPHTHALMOLOGY EXAM  02/15/2017     ROS: Positive for hearing loss, runny nose, back pain, seasonal allergies, headaches, and sad mood, otherwise a complete review of systems was negative.   PMH:  The following were reviewed and entered/updated in epic: Past Medical History:  Diagnosis Date  . Allergy   . Anemia   . Asthma   . BPH (benign prostatic hyperplasia)   . CAD (coronary artery disease)    a. s/p MI 09/1993;  b. known CTO of RCA;  c. 06/2014 Cath: LM short/mod dzs, LAD 95ost, LCX nl, RI nl, RCA 100, EF 55%;  c. 06/2014 s/p CABG x 4: LIMA->LAD, VG->D1, VG->OM1, VG->Acute Marginal.  . Carotid stenosis    a. Carotid US 1/17:  RICA 40-59%; LICA 60-79% >> FU 1 year  . CKD (chronic kidney disease), stage III (HCC)   . Constipation    in the past 2-3 weeks  . Diabetes (HCC)    AODM  . Diastolic dysfunction    a. 05/2015 Echo: EF 60-65%, LVH, Gr 1 DD, mild MR, mod dil RA/LA, mod TR, PASP 40mmHg.  .  Essential hypertension   . GERD (gastroesophageal reflux disease)   . Hyperlipidemia    Patient Active Problem List   Diagnosis Date Noted  . Uncontrolled type 2 diabetes mellitus with hyperglycemia, without long-term current use of insulin (HCC) 01/03/2017  . Diastolic dysfunction   . Essential hypertension   . CKD (chronic kidney disease), stage III (HCC)   . S/P CABG x 4 05/21/2014  . Ischemic chest pain 05/19/2014  . Chest pain with moderate risk for cardiac etiology 02/12/2013  . Chronic renal insufficiency, stage III (moderate) (HCC) 02/12/2013  . Carotid artery disease (HCC) 12/01/2007  . DEGENERATIVE DISC DISEASE 12/01/2007  . BPH (benign prostatic hyperplasia) 12/01/2007  . DM 11/28/2007  . HLD (hyperlipidemia) 11/28/2007  . CAD (coronary artery disease) 11/28/2007  . GERD 11/28/2007  . DEGENERATIVE JOINT DISEASE 11/28/2007   Past Surgical History:  Procedure Laterality Date  . ANGIOPLASTY  09/22/1993   POBA of RCA (Dr. R. Weintraub)  . BACK SURGERY  2000   back fusion - Dr. Henry Pool  . CARDIAC CATHETERIZATION  11/28/1998   "silent occlusion" of RCA w/collaterals from left coronary system, prox LAD w/40-50% eccentric narrowing, 1st diagonal with 70-80% eccentric narrowing, 85% narrowing of prox small OM1 (Dr. R. Weintraub)  . CAROTID DOPPLER  07/2011     left subclavian (50-69%); right bulb (0-49%); RICA (normal); left mid-distal CCA (0-49%); left bulb/prox ICA (50-69%); left vertebral with abnormal antegrade flow  . CORONARY ARTERY BYPASS GRAFT N/A 05/21/2014   Procedure: CORONARY ARTERY BYPASS GRAFTING (CABG) x  four, using left internal mammary artery and right leg greater saphenous vein harvested endoscopically;  Surgeon: Melrose Nakayama, MD;  Location: [place];  Service: Open Heart Surgery;  Laterality: N/A;  . INTRAOPERATIVE TRANSESOPHAGEAL ECHOCARDIOGRAM N/A 05/21/2014   Procedure: INTRAOPERATIVE TRANSESOPHAGEAL ECHOCARDIOGRAM;  Surgeon: Melrose Nakayama, MD;   Location: [place];  Service: Open Heart Surgery;  Laterality: N/A;  . LEFT HEART CATHETERIZATION WITH CORONARY ANGIOGRAM N/A 05/19/2014   Procedure: LEFT HEART CATHETERIZATION WITH CORONARY ANGIOGRAM;  Surgeon: Blane Ohara, MD;  Location: [facility] CATH LAB;  Service: Cardiovascular;  Laterality: N/A;  . NASAL SINUS SURGERY  2010  . NM MYOCAR PERF WALL MOTION  09/19/2009   bruce myoview - mild perfusion defect in basal inferior region (infarct/scar), EF 60%, low risk scan  . RENAL DOPPLER  10/2011   SMA w/ 70-99% diameter reduction & high grade stenosis; R&L renals w/narrowing and increased velocities (60-99%), R kidney smaller than L  . TRANSTHORACIC ECHOCARDIOGRAM  10/20/2012   EF 55-60%, moderate concentric hypertrophy, ventricular septum thickness increased, calcified MV annulus  . VASECTOMY  1963    Family History  Problem Relation Age of Onset  . Uterine cancer Mother   . Stroke Father   . Diabetes Sister   . Colon polyps Brother   . Heart disease Brother   . Colon cancer Neg Hx   . Esophageal cancer Neg Hx   . Rectal cancer Neg Hx   . Stomach cancer Neg Hx     Medications- reviewed and updated Current Outpatient Medications  Medication Sig Dispense Refill  . ACCU-CHEK GUIDE test strip CHECK BLOOD SUGAR 2 TIMES DAILY 200 each 2  . aspirin 81 MG tablet Take 81 mg by mouth daily.    . B-D UF III MINI PEN NEEDLES 31G X 5 MM MISC USE TWICE DAILY 180 each 1  . glucose blood (ACCU-CHEK AVIVA PLUS) test strip Check blood glucose twice daily Dx code E11.65 100 each 2  . glucose blood (ACCU-CHEK GUIDE) test strip Use to check blood sugars 2 times daily 50 each 2  . insulin aspart protamine - aspart (NOVOLOG MIX 70/30 FLEXPEN) (70-30) 100 UNIT/ML FlexPen INJECT 10 UNITS BEFORE BREAKFAST AND 6 UNITS BEFORE SUPPER (PEN EXPIRES 14 DAYS AFTER OPENING) 30 mL 1  . Lancets Misc. (ACCU-CHEK SOFTCLIX LANCET DEV) KIT     . losartan (COZAAR) 50 MG tablet     . metFORMIN (GLUCOPHAGE) 1000 MG tablet  Take 1 tablet (1,000 mg total) by mouth 2 (two) times daily with a meal. 180 tablet 3  . Multiple Vitamins-Minerals (CENTRUM SILVER ADULT 50+ PO) Take by mouth.    . pioglitazone (ACTOS) 15 MG tablet Take 1 tablet (15 mg total) by mouth daily. 90 tablet 3  . simvastatin (ZOCOR) 40 MG tablet Take 1 tablet (40 mg total) by mouth every evening. 90 tablet 3  . TRUEPLUS LANCETS 28G MISC Use to test blood sugar 2 times daily 210 each 4  . torsemide (DEMADEX) 20 MG tablet Take 0.5 tablets (10 mg total) by mouth as needed. (Patient not taking: Reported on 03/29/2017)     No current facility-administered medications for this visit.     Allergies-reviewed and updated Allergies  Allergen Reactions  . Lasix [Furosemide] Other (See  Comments)    Constipation    Social History   Socioeconomic History  . Marital status: Married    Spouse name: Not on file  . Number of children: 2  . Years of education: Not on file  . Highest education level: Not on file  Occupational History  . Not on file  Social Needs  . Financial resource strain: Not on file  . Food insecurity:    Worry: Not on file    Inability: Not on file  . Transportation needs:    Medical: Not on file    Non-medical: Not on file  Tobacco Use  . Smoking status: Former Smoker    Types: Cigarettes    Last attempt to quit: 06/04/1972    Years since quitting: 45.2  . Smokeless tobacco: Current User    Types: Snuff  . Tobacco comment: quit about 40 years  Substance and Sexual Activity  . Alcohol use: Yes    Alcohol/week: 1.8 oz    Types: 3 Standard drinks or equivalent per week    Comment: a glass of red wine 3 or 4 days a week.  . Drug use: No  . Sexual activity: Not on file  Lifestyle  . Physical activity:    Days per week: Not on file    Minutes per session: Not on file  . Stress: Not on file  Relationships  . Social connections:    Talks on phone: Not on file    Gets together: Not on file    Attends religious service:  Not on file    Active member of club or organization: Not on file    Attends meetings of clubs or organizations: Not on file    Relationship status: Not on file  Other Topics Concern  . Not on file  Social History Narrative  . Not on file    Objective:  Physical Exam: BP 136/78 (BP Location: Left Arm)   Pulse (!) 58   Temp 97.9 F (36.6 C) (Oral)   Resp 12   Ht 5' 10" (1.778 m)   Wt 176 lb (79.8 kg)   SpO2 96%   BMI 25.25 kg/m   Body mass index is 25.25 kg/m. Wt Readings from Last 3 Encounters:  09/10/17 176 lb (79.8 kg)  07/19/17 180 lb 3.2 oz (81.7 kg)  07/04/17 176 lb 9.6 oz (80.1 kg)   Gen: NAD, resting comfortably HEENT: Bilateral hearing aids in place.TMs normal bilaterally. OP clear. No thyromegaly noted.  CV: RRR with no murmurs appreciated Pulm: NWOB, CTAB with no crackles, wheezes, or rhonchi GI: Normal bowel sounds present. Soft, Nontender, Nondistended. MSK: no edema, cyanosis, or clubbing noted Skin: warm, dry Neuro: CN2-12 grossly intact. Strength 5/5 in upper and lower extremities. Reflexes symmetric and intact bilaterally.  Psych: Normal affect and thought content  Assessment/Plan:  Uncontrolled type 2 diabetes mellitus with hyperglycemia, without long-term current use of insulin (HCC) Stable.  Continue current regimen.  Defer further management to endocrinology.  Essential hypertension Stable.  Continue losartan.  HLD (hyperlipidemia) Stable.  Continue simvastatin 40 mg daily.  Direct LDL 45 about 5 months ago.  Sad Mood Patient marked sad mood on his review of symptoms.  States that he has felt more depressed and lonely since 1 of his good friends passed away a few months ago.  Overall, thinks that he has been coping well.  Has been active with church and other community organizations.  Denies SI or HI.  Declines pharmacotherapy and psychotherapy   today.  Preventative Healthcare: Up-to-date on vaccinations.  Lipid panel done within the last  year.  Patient Counseling(The following topics were reviewed and/or handout was given):  -Nutrition: Stressed importance of moderation in sodium/caffeine intake, saturated fat and cholesterol, caloric balance, sufficient intake of fresh fruits, vegetables, and fiber.  -Stressed the importance of regular exercise.   -Substance Abuse: Discussed cessation/primary prevention of tobacco, alcohol, or other drug use; driving or other dangerous activities under the influence; availability of treatment for abuse.   -Injury prevention: Discussed safety belts, safety helmets, smoke detector, smoking near bedding or upholstery.   -Sexuality: Discussed sexually transmitted diseases, partner selection, use of condoms, avoidance of unintended pregnancy and contraceptive alternatives.   -Dental health: Discussed importance of regular tooth brushing, flossing, and dental visits.  -Health maintenance and immunizations reviewed. Please refer to Health maintenance section.  Return to care in 1 year for next preventative visit.    M. , MD 09/10/2017 10:05 AM  

## 2017-09-23 ENCOUNTER — Encounter: Payer: Self-pay | Admitting: Physical Therapy

## 2017-10-10 DIAGNOSIS — N401 Enlarged prostate with lower urinary tract symptoms: Secondary | ICD-10-CM | POA: Diagnosis not present

## 2017-10-10 DIAGNOSIS — R3915 Urgency of urination: Secondary | ICD-10-CM | POA: Diagnosis not present

## 2017-10-10 DIAGNOSIS — R3911 Hesitancy of micturition: Secondary | ICD-10-CM | POA: Diagnosis not present

## 2017-10-15 NOTE — Progress Notes (Signed)
Patient ID: Justin Salinas, male   DOB: [birth year], 77 y.o.   MRN: [id number]           Reason for Appointment: Follow-up for Type 2 Diabetes    History of Present Illness:          Date of diagnosis of type 2 diabetes mellitus:2000        Background history:   His diabetes had been mild initially and treated with metformin He had subsequently other medications added including Amaryl and Januvia No details are available from PCP office, [name] in 2015 was 7.6   Because of his poor control was started on insulin on 01/03/17.  Recent history:   Insulin regimen: NovoLog mix, 10 units at breakfast and 6 at supper  Non-insulin hypoglycemic drugs the patient is taking are: Metformin 1 g twice a day, Actos 15 mg daily   He had a baseline A1c A1c 9.7, now 6.9 and stable  Current management, blood sugar patterns and problems identified:  He has overall better blood sugar readings at home compared to his last visit  However he is still checking his blood sugars mostly right before breakfast and suppertime and not after meals as directed  He thinks he is trying to be more regular with taking his insulin injections before breakfast and supper and not forgetting as before  Only rarely has a blood sugar of 160 or more late afternoon but fairly good on an average  His weight has fluctuated but about the same as 3 months ago  He is still very consistent with trying to go to the gym for exercise at least every other day in the morning  Most of the time he thinks he is watching the diet as advised by the dietitian previously  [name]  Also no side effects from Actos or metformin        Side effects from medications have been: None  Compliance with the medical regimen: Good  Glucose monitoring:  done 2 times a day         Glucometer:  Accu-Chek guide .      Blood Glucose readings by download:  Mean values apply above for all meters except median for One  Touch  Previous blood sugar average 143  PRE-MEAL Fasting Lunch Dinner Bedtime Overall  Glucose range:  85-125   84-197  84-197  Mean/median:  106   134   120    Self-care: The diet that the patient has been following is: tries to limit sweets, fried food       Typical meal intake: Breakfast is eggs meat, toast.  Lunch: Sandwich or.Crackers with or without peanut butter usually                Dietician visit, most recent: 03/2017               Exercise:   walking or other exercise at the gym 3/7 days for at least 40 min, usually early morning   Weight history:   Wt Readings from Last 3 Encounters:  10/16/17 180 lb 12.8 oz (82 kg)  09/10/17 176 lb (79.8 kg)  07/19/17 180 lb 3.2 oz (81.7 kg)    Glycemic control:   Lab Results  Component Value Date   HGBA1C 6.9 10/16/2017   HGBA1C 7.0 07/19/2017   HGBA1C 6.8 04/17/2017   Lab Results  Component Value Date   MICROALBUR <0.7 02/14/2017   CREATININE 1.53 (H) 04/17/2017   Lab Results  Component Value Date   MICRALBCREAT 0.5 02/14/2017   Microalbumin ratio 9 in 12/2015  Lab Results  Component Value Date   FRUCTOSAMINE 351 (H) 02/14/2017      Allergies as of 10/16/2017      Reactions   Lasix [furosemide] Other (See Comments)   Constipation      Medication List        Accurate as of 10/16/17  8:22 AM. Always use your most recent med list.          ACCU-CHEK SOFTCLIX LANCET DEV Kit   aspirin 81 MG tablet Take 81 mg by mouth daily.   B-D UF III MINI PEN NEEDLES 31G X 5 MM Misc Generic drug:  Insulin Pen Needle USE TWICE DAILY   CENTRUM SILVER ADULT 50+ PO Take by mouth.   glucose blood test strip Commonly known as:  ACCU-CHEK AVIVA PLUS Check blood glucose twice daily Dx code E11.65   glucose blood test strip Commonly known as:  ACCU-CHEK GUIDE Use to check blood sugars 2 times daily   ACCU-CHEK GUIDE test strip Generic drug:  glucose blood CHECK BLOOD SUGAR 2 TIMES DAILY   insulin aspart  protamine - aspart (70-30) 100 UNIT/ML FlexPen Commonly known as:  NOVOLOG MIX 70/30 FLEXPEN INJECT 10 UNITS BEFORE BREAKFAST AND 6 UNITS BEFORE SUPPER (PEN EXPIRES 14 DAYS AFTER OPENING)   losartan 50 MG tablet Commonly known as:  COZAAR   metFORMIN 1000 MG tablet Commonly known as:  GLUCOPHAGE Take 1 tablet (1,000 mg total) by mouth 2 (two) times daily with a meal.   pioglitazone 15 MG tablet Commonly known as:  ACTOS Take 1 tablet (15 mg total) by mouth daily.   simvastatin 40 MG tablet Commonly known as:  ZOCOR Take 1 tablet (40 mg total) by mouth every evening.   torsemide 20 MG tablet Commonly known as:  DEMADEX Take 0.5 tablets (10 mg total) by mouth as needed.   TRUEPLUS LANCETS 28G Misc Use to test blood sugar 2 times daily       Allergies:  Allergies  Allergen Reactions  . Lasix [Furosemide] Other (See Comments)    Constipation    Past Medical History:  Diagnosis Date  . Allergy   . Anemia   . Asthma   . BPH (benign prostatic hyperplasia)   . CAD (coronary artery disease)    a. s/p MI 09/1993;  b. known CTO of RCA;  c. 06/2014 Cath: LM short/mod dzs, LAD 95ost, LCX nl, RI nl, RCA 100, EF 55%;  c. 06/2014 s/p CABG x 4: LIMA->LAD, VG->D1, VG->OM1, VG->Acute Marginal.  . Carotid stenosis    a. Carotid US 4/03:  RICA 47-42%; LICA 59-56% >> FU 1 year  . CKD (chronic kidney disease), stage III ([place])   . Constipation    in the past 2-3 weeks  . Diabetes ([place])    AODM  . Diastolic dysfunction    a. 05/2015 Echo: EF 60-65%, LVH, Gr 1 DD, mild MR, mod dil RA/LA, mod TR, PASP 15mHg.  . Essential hypertension   . GERD (gastroesophageal reflux disease)   . Hyperlipidemia     Past Surgical History:  Procedure Laterality Date  . ANGIOPLASTY  09/22/1993   POBA of RCA (Dr. RMarella Chimes  . BACK SURGERY  2000   back fusion - Dr. HEarnie Larsson . CARDIAC CATHETERIZATION  11/28/1998   "silent occlusion" of RCA w/collaterals from left coronary system, prox LAD w/40-50%  eccentric narrowing, 1st diagonal with 70-80% eccentric  narrowing, 85% narrowing of prox small OM1 (Dr. Marella Chimes)  . CAROTID DOPPLER  07/2011   left subclavian (50-69%); right bulb (0-49%); RICA (normal); left mid-distal CCA (0-49%); left bulb/prox ICA (50-69%); left vertebral with abnormal antegrade flow  . CORONARY ARTERY BYPASS GRAFT N/A 05/21/2014   Procedure: CORONARY ARTERY BYPASS GRAFTING (CABG) x  four, using left internal mammary artery and right leg greater saphenous vein harvested endoscopically;  Surgeon: Melrose Nakayama, MD;  Location: [place];  Service: Open Heart Surgery;  Laterality: N/A;  . INTRAOPERATIVE TRANSESOPHAGEAL ECHOCARDIOGRAM N/A 05/21/2014   Procedure: INTRAOPERATIVE TRANSESOPHAGEAL ECHOCARDIOGRAM;  Surgeon: Melrose Nakayama, MD;  Location: [place];  Service: Open Heart Surgery;  Laterality: N/A;  . LEFT HEART CATHETERIZATION WITH CORONARY ANGIOGRAM N/A 05/19/2014   Procedure: LEFT HEART CATHETERIZATION WITH CORONARY ANGIOGRAM;  Surgeon: Blane Ohara, MD;  Location: [facility] CATH LAB;  Service: Cardiovascular;  Laterality: N/A;  . NASAL SINUS SURGERY  2010  . NM MYOCAR PERF WALL MOTION  09/19/2009   bruce myoview - mild perfusion defect in basal inferior region (infarct/scar), EF 60%, low risk scan  . RENAL DOPPLER  10/2011   SMA w/ 70-99% diameter reduction & high grade stenosis; R&L renals w/narrowing and increased velocities (60-99%), R kidney smaller than L  . TRANSTHORACIC ECHOCARDIOGRAM  10/20/2012   EF 55-60%, moderate concentric hypertrophy, ventricular septum thickness increased, calcified MV annulus  . VASECTOMY  1963    Family History  Problem Relation Age of Onset  . Uterine cancer Mother   . Stroke Father   . Diabetes Sister   . Colon polyps Brother   . Heart disease Brother   . Colon cancer Neg Hx   . Esophageal cancer Neg Hx   . Rectal cancer Neg Hx   . Stomach cancer Neg Hx     Social History:  reports that he quit smoking about 45 years  ago. His smoking use included cigarettes. His smokeless tobacco use includes snuff. He reports that he drinks about 1.8 oz of alcohol per week. He reports that he does not use drugs.   Review of Systems   Lipid history: Most recent LDL:     Lab Results  Component Value Date   LDLDIRECT 45.0 04/17/2017           Hypertension: Blood pressure is high normal  Currently only taking 50 mg of losartan from his cardiologist  BP Readings from Last 3 Encounters:  10/16/17 (!) 142/70  09/10/17 136/78  07/19/17 (!) 148/80   Renal function has been variable:  Lab Results  Component Value Date   CREATININE 1.53 (H) 04/17/2017   CREATININE 1.57 (H) 02/28/2017   CREATININE 1.76 (H) 02/14/2017   He is complaining about easy fatigability and giving out or afternoons Not clear if he has been evaluated for anemia or hypothyroidism in the past  Most recent eye exam was 10/17  Most recent foot exam: 8/18   LABS:  Office Visit on 10/16/2017  Component Date Value Ref Range Status  . Hemoglobin A1C 10/16/2017 6.9   Final    Physical Examination:  BP (!) 142/70 (BP Location: Left Arm, Patient Position: Sitting, Cuff Size: Normal)   Pulse 70   Ht '5\' 10"'$  (1.778 m)   Wt 180 lb 12.8 oz (82 kg)   SpO2 97%   BMI 25.94 kg/m   His feet are normal to inspection including plantar surfaces    No edema present  ASSESSMENT:  Diabetes type 2, insulin requiring  See history of present illness for detailed discussion of current diabetes management, blood sugar patterns and problems identified  [name] today is 6.8 and very stable  This is with only low-dose insulin, pre-mixed variety at breakfast and suppertime along with Actos and metformin  Blood sugars are fairly stable and reasonably good both at breakfast and suppertime but does need to check readings after meals also He is doing fairly well with compliance with his insulin also at this time [name] overnight  Mild CKD:  Creatinine will be rechecked today  Fatigue: We will check CBC and thyroid levels while drawing his labs and forward to PCP     PLAN:     He will need to start checking blood sugars after meals and less often before meals especially breakfast  Continue regular exercise program  No change in insulin, Actos or metformin  Other problems: See above    There are no Patient Instructions on file for this visit.    Elayne Snare 10/16/2017, 8:22 AM   Note: This office note was prepared with Dragon voice recognition system technology. Any transcriptional errors that result from this process are unintentional.

## 2017-10-16 ENCOUNTER — Ambulatory Visit: Payer: Medicare HMO | Admitting: Endocrinology

## 2017-10-16 ENCOUNTER — Encounter: Payer: Self-pay | Admitting: Endocrinology

## 2017-10-16 VITALS — BP 142/70 | HR 70 | Ht 70.0 in | Wt 180.8 lb

## 2017-10-16 DIAGNOSIS — E1165 Type 2 diabetes mellitus with hyperglycemia: Secondary | ICD-10-CM | POA: Diagnosis not present

## 2017-10-16 DIAGNOSIS — R5383 Other fatigue: Secondary | ICD-10-CM

## 2017-10-16 DIAGNOSIS — Z794 Long term (current) use of insulin: Secondary | ICD-10-CM

## 2017-10-16 DIAGNOSIS — E119 Type 2 diabetes mellitus without complications: Secondary | ICD-10-CM

## 2017-10-16 LAB — CBC WITH DIFFERENTIAL/PLATELET
Basophils Absolute: 0.1 10*3/uL (ref 0.0–0.1)
Basophils Relative: 1 % (ref 0.0–3.0)
Eosinophils Absolute: 0.3 10*3/uL (ref 0.0–0.7)
Eosinophils Relative: 5 % (ref 0.0–5.0)
HCT: 29 % — ABNORMAL LOW (ref 39.0–52.0)
Hemoglobin: 9.3 g/dL — ABNORMAL LOW (ref 13.0–17.0)
Lymphocytes Relative: 17.1 % (ref 12.0–46.0)
Lymphs Abs: 0.9 10*3/uL (ref 0.7–4.0)
MCHC: 32 g/dL (ref 30.0–36.0)
MCV: 86.4 fl (ref 78.0–100.0)
Monocytes Absolute: 0.4 10*3/uL (ref 0.1–1.0)
Monocytes Relative: 7.7 % (ref 3.0–12.0)
Neutro Abs: 3.7 10*3/uL (ref 1.4–7.7)
Neutrophils Relative %: 69.2 % (ref 43.0–77.0)
Platelets: 206 10*3/uL (ref 150.0–400.0)
RBC: 3.35 Mil/uL — ABNORMAL LOW (ref 4.22–5.81)
RDW: 16.3 % — ABNORMAL HIGH (ref 11.5–15.5)
WBC: 5.3 10*3/uL (ref 4.0–10.5)

## 2017-10-16 LAB — COMPREHENSIVE METABOLIC PANEL
ALT: 14 U/L (ref 0–53)
AST: 18 U/L (ref 0–37)
Albumin: 3.7 g/dL (ref 3.5–5.2)
Alkaline Phosphatase: 52 U/L (ref 39–117)
BUN: 22 mg/dL (ref 6–23)
CO2: 26 mEq/L (ref 19–32)
Calcium: 9.1 mg/dL (ref 8.4–10.5)
Chloride: 104 mEq/L (ref 96–112)
Creatinine, Ser: 1.49 mg/dL (ref 0.40–1.50)
GFR: 48.33 mL/min — ABNORMAL LOW (ref >60.00)
Glucose, Bld: 227 mg/dL — ABNORMAL HIGH (ref 70–99)
Potassium: 5 mEq/L (ref 3.5–5.1)
Sodium: 137 mEq/L (ref 135–145)
Total Bilirubin: 0.4 mg/dL (ref 0.2–1.2)
Total Protein: 6.6 g/dL (ref 6.0–8.3)

## 2017-10-16 LAB — TSH: TSH: 1.55 u[IU]/mL (ref 0.35–4.50)

## 2017-10-16 LAB — POCT GLYCOSYLATED HEMOGLOBIN (HGB A1C): Hemoglobin A1C: 6.9

## 2017-10-16 NOTE — Patient Instructions (Addendum)
Check blood sugars on waking up 2-3/7 days   Also check blood sugars about 2 hours after a meal and do this after different meals by rotation  Recommended blood sugar levels on waking up is 90-130 and about 2 hours after meal is 130-160  Please bring your blood sugar monitor to each visit, thank you

## 2017-10-22 ENCOUNTER — Ambulatory Visit (INDEPENDENT_AMBULATORY_CARE_PROVIDER_SITE_OTHER): Payer: Medicare HMO | Admitting: Family Medicine

## 2017-10-22 ENCOUNTER — Encounter: Payer: Self-pay | Admitting: Family Medicine

## 2017-10-22 VITALS — BP 128/62 | HR 69 | Temp 97.8°F | Resp 16 | Ht 70.0 in | Wt 178.0 lb

## 2017-10-22 DIAGNOSIS — D649 Anemia, unspecified: Secondary | ICD-10-CM

## 2017-10-22 DIAGNOSIS — I10 Essential (primary) hypertension: Secondary | ICD-10-CM | POA: Diagnosis not present

## 2017-10-22 DIAGNOSIS — D509 Iron deficiency anemia, unspecified: Secondary | ICD-10-CM | POA: Insufficient documentation

## 2017-10-22 LAB — VITAMIN D 25 HYDROXY (VIT D DEFICIENCY, FRACTURES): VITD: 45.05 ng/mL (ref 30.00–100.00)

## 2017-10-22 LAB — VITAMIN B12: Vitamin B-12: 262 pg/mL (ref 211–911)

## 2017-10-22 LAB — FOLATE: Folate: >24.1 ng/mL (ref >5.9)

## 2017-10-22 NOTE — Progress Notes (Signed)
   Subjective:  Justin Salinas is a 77 y.o. male who presents today with a chief complaint of anemia and fatigue.   HPI:  Anemia/fatigue, New problem Patient found to have hemoglobin of 9.3 last week on blood work performed by his endocrinologist.  Patient has been told he has had anemia in the past, but has never been told what the cause of this was.  He reports some fatigue over the past several years.  No overt signs of bleeding.  No reported hematuria, melena, or hematochezia.  Takes Centrum multivitamin daily.  Hypertension Takes losartan 50 mg daily.  Tolerates this well without side effects.  ROS: Per HPI  PMH: He reports that he quit smoking about 45 years ago. His smoking use included cigarettes. His smokeless tobacco use includes snuff. He reports that he drinks about 1.8 oz of alcohol per week. He reports that he does not use drugs.  Objective:  Physical Exam: BP 128/62   Pulse 69   Temp 97.8 F (36.6 C) (Oral)   Resp 16   Ht 5\' 10"  (1.778 m)   Wt 178 lb (80.7 kg)   SpO2 96%   BMI 25.54 kg/m   Gen: NAD, resting comfortably CV: RRR with no murmurs appreciated Pulm: NWOB, CTAB with no crackles, wheezes, or rhonchi MSK: No edema, cyanosis, or clubbing noted Skin: Warm, dry Neuro: Grossly normal, moves all extremities Psych: Normal affect and thought content  Assessment/Plan:  Normocytic anemia Unclear etiology. Possibly releated to CKD. We will check CBC with diff, smear review,  folate/b12 and iron panel.  Essential hypertension At goal on losartan 50mg  daily. Will continue.   Justin Salinas. [name], MD 10/22/2017 10:29 AM

## 2017-10-22 NOTE — Patient Instructions (Signed)
It was very nice to see you today.  We will check blood work to look for causes of your anemia.  We will not make any other medication changes today.  Take care, Dr [name]

## 2017-10-22 NOTE — Assessment & Plan Note (Signed)
At goal on losartan 50mg  daily. Will continue.

## 2017-10-22 NOTE — Assessment & Plan Note (Signed)
Unclear etiology. Possibly releated to CKD. We will check CBC with diff, smear review,  folate/b12 and iron panel.

## 2017-10-23 LAB — CBC WITH DIFFERENTIAL/PLATELET
Basophils Absolute: 52 cells/uL (ref 0–200)
Basophils Relative: 1.3 %
Eosinophils Absolute: 240 cells/uL (ref 15–500)
Eosinophils Relative: 6 %
HCT: 28 % — ABNORMAL LOW (ref 38.5–50.0)
Hemoglobin: 8.9 g/dL — ABNORMAL LOW (ref 13.2–17.1)
Lymphs Abs: 948 cells/uL (ref 850–3900)
MCH: 27.3 pg (ref 27.0–33.0)
MCHC: 31.8 g/dL — ABNORMAL LOW (ref 32.0–36.0)
MCV: 85.9 fL (ref 80.0–100.0)
MPV: 10.4 fL (ref 7.5–12.5)
Monocytes Relative: 10.1 %
Neutro Abs: 2356 cells/uL (ref 1500–7800)
Neutrophils Relative %: 58.9 %
Platelets: 189 10*3/uL (ref 140–400)
RBC: 3.26 10*6/uL — ABNORMAL LOW (ref 4.20–5.80)
RDW: 14 % (ref 11.0–15.0)
Total Lymphocyte: 23.7 %
WBC mixed population: 404 cells/uL (ref 200–950)
WBC: 4 10*3/uL (ref 3.8–10.8)

## 2017-10-23 LAB — IRON,TIBC AND FERRITIN PANEL
%SAT: 10 % (calc) — ABNORMAL LOW (ref 15–60)
Ferritin: 10 ng/mL — ABNORMAL LOW (ref 20–380)
Iron: 35 ug/dL — ABNORMAL LOW (ref 50–180)
TIBC: 343 mcg/dL (calc) (ref 250–425)

## 2017-10-23 LAB — PATHOLOGIST SMEAR REVIEW

## 2017-10-25 ENCOUNTER — Other Ambulatory Visit: Payer: Self-pay

## 2017-10-25 DIAGNOSIS — N183 Chronic kidney disease, stage 3 unspecified: Secondary | ICD-10-CM

## 2017-10-25 NOTE — Progress Notes (Signed)
LVM for patient and also routed to [facility]

## 2017-10-29 ENCOUNTER — Other Ambulatory Visit: Payer: Self-pay

## 2017-10-29 NOTE — Progress Notes (Signed)
Iron, TIBC and Ferritin Panel Ordered On: 10/25/2017  CBC Ordered On: 10/25/2017   I thought we looked at this together. WOuld you please check, [name] said it was a different set of orders

## 2017-10-30 ENCOUNTER — Other Ambulatory Visit: Payer: Self-pay

## 2017-10-30 ENCOUNTER — Ambulatory Visit: Payer: Medicare HMO | Admitting: Family Medicine

## 2017-10-30 DIAGNOSIS — D649 Anemia, unspecified: Secondary | ICD-10-CM

## 2017-10-30 DIAGNOSIS — D509 Iron deficiency anemia, unspecified: Secondary | ICD-10-CM

## 2017-10-30 NOTE — Progress Notes (Signed)
Orders have been placed and patient has picked up stool cards this morning.

## 2017-11-02 ENCOUNTER — Other Ambulatory Visit: Payer: Self-pay | Admitting: Endocrinology

## 2017-11-04 ENCOUNTER — Telehealth: Payer: Self-pay | Admitting: Family Medicine

## 2017-11-04 NOTE — Telephone Encounter (Signed)
Copied from [place] [phone]. Topic: Quick Communication - Rx Refill/Question >> Nov 04, 2017  3:06 PM Oliver Pila B wrote: Medication: simvastatin (ZOCOR) 40 MG tablet [[id number]] , torsemide (DEMADEX) 20 MG tablet [[id number]]  ENDED   Has the patient contacted their pharmacy? Yes.   (Agent: If no, request that the patient contact the pharmacy for the refill.) (Agent: If yes, when and what did the pharmacy advise?)  Preferred Pharmacy (with phone number or street name): [facility]   Agent: Please be advised that RX refills may take up to 3 business days. We ask that you follow-up with your pharmacy.

## 2017-11-05 ENCOUNTER — Other Ambulatory Visit: Payer: Medicare HMO

## 2017-11-05 DIAGNOSIS — D509 Iron deficiency anemia, unspecified: Secondary | ICD-10-CM

## 2017-11-05 NOTE — Addendum Note (Signed)
Addended by: Frutoso Chase A on: 11/05/2017 10:42 AM   Modules accepted: Orders

## 2017-11-06 ENCOUNTER — Other Ambulatory Visit (INDEPENDENT_AMBULATORY_CARE_PROVIDER_SITE_OTHER): Payer: Medicare HMO

## 2017-11-06 DIAGNOSIS — D509 Iron deficiency anemia, unspecified: Secondary | ICD-10-CM

## 2017-11-06 LAB — FECAL OCCULT BLOOD, IMMUNOCHEMICAL: Fecal Occult Bld: NEGATIVE

## 2017-11-06 NOTE — Telephone Encounter (Signed)
Left VM asking patient to return call to speak with a nurse regarding his request for refill on Demadex. It was a one time order that expired 06/03/17.

## 2017-11-19 DIAGNOSIS — R3915 Urgency of urination: Secondary | ICD-10-CM | POA: Diagnosis not present

## 2017-11-19 DIAGNOSIS — N401 Enlarged prostate with lower urinary tract symptoms: Secondary | ICD-10-CM | POA: Diagnosis not present

## 2017-11-21 ENCOUNTER — Other Ambulatory Visit (HOSPITAL_COMMUNITY): Payer: Self-pay | Admitting: Family Medicine

## 2017-11-21 DIAGNOSIS — I6523 Occlusion and stenosis of bilateral carotid arteries: Secondary | ICD-10-CM

## 2017-12-02 ENCOUNTER — Ambulatory Visit (HOSPITAL_COMMUNITY)
Admission: RE | Admit: 2017-12-02 | Discharge: 2017-12-02 | Disposition: A | Discharge status: Home / Self Care | Payer: Medicare HMO | Source: Ambulatory Visit | Attending: Cardiovascular Disease | Admitting: Cardiovascular Disease

## 2017-12-02 DIAGNOSIS — E119 Type 2 diabetes mellitus without complications: Secondary | ICD-10-CM | POA: Insufficient documentation

## 2017-12-02 DIAGNOSIS — I10 Essential (primary) hypertension: Secondary | ICD-10-CM | POA: Insufficient documentation

## 2017-12-02 DIAGNOSIS — Z87891 Personal history of nicotine dependence: Secondary | ICD-10-CM | POA: Diagnosis not present

## 2017-12-02 DIAGNOSIS — I6523 Occlusion and stenosis of bilateral carotid arteries: Secondary | ICD-10-CM | POA: Diagnosis not present

## 2017-12-02 DIAGNOSIS — I2510 Atherosclerotic heart disease of native coronary artery without angina pectoris: Secondary | ICD-10-CM | POA: Diagnosis not present

## 2017-12-02 DIAGNOSIS — E785 Hyperlipidemia, unspecified: Secondary | ICD-10-CM | POA: Diagnosis not present

## 2017-12-30 ENCOUNTER — Ambulatory Visit: Payer: Medicare HMO | Admitting: Family Medicine

## 2017-12-31 ENCOUNTER — Encounter: Payer: Self-pay | Admitting: Family Medicine

## 2017-12-31 ENCOUNTER — Ambulatory Visit: Payer: Medicare HMO | Admitting: Family Medicine

## 2017-12-31 VITALS — BP 148/74 | HR 56 | Temp 97.7°F | Ht 70.0 in | Wt 177.8 lb

## 2017-12-31 DIAGNOSIS — I10 Essential (primary) hypertension: Secondary | ICD-10-CM

## 2017-12-31 DIAGNOSIS — N400 Enlarged prostate without lower urinary tract symptoms: Secondary | ICD-10-CM | POA: Diagnosis not present

## 2017-12-31 DIAGNOSIS — E782 Mixed hyperlipidemia: Secondary | ICD-10-CM | POA: Diagnosis not present

## 2017-12-31 DIAGNOSIS — D509 Iron deficiency anemia, unspecified: Secondary | ICD-10-CM | POA: Diagnosis not present

## 2017-12-31 MED ORDER — TAMSULOSIN HCL 0.4 MG PO CAPS: 0.4000 mg | ORAL_CAPSULE | Freq: Every day | ORAL | 3 refills | Status: DC

## 2017-12-31 NOTE — Assessment & Plan Note (Addendum)
Stable on simvastatin 40 mg daily.  Check lipid panel with next blood draw.

## 2017-12-31 NOTE — Patient Instructions (Addendum)
It was very nice to see you today!  Please start the flomax. Please let me know if your symptoms worsen or do not improve over the next few days.  No other medication changes today.  Please come back to see me in 3 to 6 months.  Take care, Dr [name]

## 2017-12-31 NOTE — Progress Notes (Signed)
   Subjective:  Justin Salinas is a 77 y.o. male who presents today with a chief complaint of hypertension.   HPI:  Urinary Frequency, new problem to provider Symptoms started a few weeks ago.  Stable over that time.  Associated with urgency.  Wakes up 3-4 times at night to urinate.  He has recently been drinking more tea and lemonade which he thinks is contributed.  He has had similar symptoms in the past that resolved after he stayed off coffee and tea.  He has been diagnosed with BPH in the past.  He has been on Flomax in the past which helped with his symptoms. No other obvious alleviating or aggravating factors.  Iron Deficiency Anemia, new problem to provider Found on blood work 2 months ago.  He was started on every other day iron supplementation.  It occasionally causes some abdominal pain, but otherwise he is tolerating it well.  Hypertension, chronic problem, stable Currently on losartan 50 mg daily.  Tolerates well without side effects.  No reported chest pain or shortness of breath.  HLD, chronic problem, stable Currently on simvastatin 40 mg daily.  Tolerates well without reported side effects.  ROS: Per HPI  PMH: He reports that he quit smoking about 45 years ago. His smoking use included cigarettes. His smokeless tobacco use includes snuff. He reports that he drinks about 1.8 oz of alcohol per week. He reports that he does not use drugs.  Objective:  Physical Exam: BP (!) 148/74 (BP Location: Left Arm, Patient Position: Sitting, Cuff Size: Normal)   Pulse (!) 56   Temp 97.7 F (36.5 C) (Oral)   Ht 5\' 10"  (1.778 m)   Wt 177 lb 12.8 oz (80.6 kg)   SpO2 96%   BMI 25.51 kg/m   Gen: NAD, resting comfortably CV: RRR with no murmurs appreciated Pulm: NWOB, CTAB with no crackles, wheezes, or rhonchi GI: Normal bowel sounds present. Soft, Nontender,   Assessment/Plan:  HLD (hyperlipidemia) Stable on simvastatin 40 mg daily.  Check lipid panel with next blood draw.    Iron deficiency anemia On ferrous sulfate 325 mg every other day.  Check repeat CBC and iron panel with next blood draw in the next 3 to 6 months.  Essential hypertension Slightly above goal today.  Previously well controlled.  Continue losartan 50 mg daily.  Additionally will start Flomax for his BPH which should help with his blood pressure as well.  Follow-up in 3 to 6 months.  BPH (benign prostatic hyperplasia) Restart Flomax 0.4 mg daily.  He will follow-up in 3 to 6 months, or sooner as needed.  If no improvement, would consider referral to urology.   [name]. [name], MD 12/31/2017 9:42 AM

## 2017-12-31 NOTE — Assessment & Plan Note (Signed)
On ferrous sulfate 325 mg every other day.  Check repeat CBC and iron panel with next blood draw in the next 3 to 6 months.

## 2017-12-31 NOTE — Assessment & Plan Note (Signed)
Slightly above goal today.  Previously well controlled.  Continue losartan 50 mg daily.  Additionally will start Flomax for his BPH which should help with his blood pressure as well.  Follow-up in 3 to 6 months.

## 2017-12-31 NOTE — Assessment & Plan Note (Signed)
Restart Flomax 0.4 mg daily.  He will follow-up in 3 to 6 months, or sooner as needed.  If no improvement, would consider referral to urology.

## 2018-02-16 NOTE — Progress Notes (Signed)
Patient ID: Bethann Punches Chap, male   DOB: [birth year], 77 y.o.   MRN: [id number]           Reason for Appointment: Follow-up for Type 2 Diabetes    History of Present Illness:          Date of diagnosis of type 2 diabetes mellitus:2000        Background history:   His diabetes had been mild initially and treated with metformin He had subsequently other medications added including Amaryl and Januvia No details are available from PCP office, [name] in 2015 was 7.6   Because of his poor control was started on insulin on 01/03/17.  Recent history:   Insulin regimen: NovoLog mix, 10 units at breakfast and 6 at supper  Non-insulin hypoglycemic drugs the patient is taking are: Metformin 1 g twice a day, Actos 15 mg daily   He had a baseline A1c A1c 9.7, now down to 6.2  Current management, blood sugar patterns and problems identified:  His blood sugars at home are being checked mostly before breakfast and dinnertime even though he has been reminded several times to alternate with postprandial readings  Overall blood sugar looking about the same at home  Without taking any insulin at lunchtime he will occasionally have a high reading in the afternoon but not over 180  Blood sugars before breakfast and dinnertime will be fairly stable without excessive variability  [name] reported including overnight and the lowest reading was 75  Is fairly consistent with taking his insulin right before eating breakfast and dinnertime        Side effects from medications have been: None  Compliance with the medical regimen: Good  Glucose monitoring:  done 2 times a day         Glucometer:  Accu-Chek guide .      Blood Glucose readings by download:  Mean values apply above for all meters except median for One Touch  Previous blood sugar average 120   PRE-MEAL Fasting Lunch Dinner Bedtime Overall  Glucose range:  75-146  82-178    Mean/median: 115  121     POST-MEAL PC  Breakfast PC Lunch PC Dinner  glucose range:   ?   Mean/median:        Self-care: The diet that the patient has been following is: tries to limit sweets, fried food       Typical meal intake: Breakfast is eggs meat, toast.  Lunch: Sandwich or.Crackers with or without peanut butter usually                Dietician visit, most recent: 03/2017               Exercise:   walking or other exercise at the gym 3/7 days for at least 40 min, usually early morning   Weight history:   Wt Readings from Last 3 Encounters:  02/17/18 180 lb (81.6 kg)  12/31/17 177 lb 12.8 oz (80.6 kg)  10/22/17 178 lb (80.7 kg)    Glycemic control:   Lab Results  Component Value Date   HGBA1C 6.2 (A) 02/17/2018   HGBA1C 6.9 10/16/2017   HGBA1C 7.0 07/19/2017   Lab Results  Component Value Date   MICROALBUR <0.7 02/14/2017   CREATININE 1.49 10/16/2017   Lab Results  Component Value Date   MICRALBCREAT 0.5 02/14/2017   Microalbumin ratio 9 in 12/2015  Lab Results  Component Value Date   FRUCTOSAMINE 351 (H) 02/14/2017  Allergies as of 02/17/2018      Reactions   Lasix [furosemide] Other (See Comments)   Constipation      Medication List        Accurate as of 02/17/18  1:01 PM. Always use your most recent med list.          ACCU-CHEK SOFTCLIX LANCET DEV Kit   aspirin 81 MG tablet Take 81 mg by mouth daily.   B-D UF III MINI PEN NEEDLES 31G X 5 MM Misc Generic drug:  Insulin Pen Needle USE AS DIRECTED TWICE DAILY   CENTRUM SILVER ADULT 50+ PO Take by mouth.   glucose blood test strip Check blood glucose twice daily Dx code E11.65   glucose blood test strip Use to check blood sugars 2 times daily   ACCU-CHEK GUIDE test strip Generic drug:  glucose blood CHECK BLOOD SUGAR 2 TIMES DAILY   insulin aspart protamine - aspart (70-30) 100 UNIT/ML FlexPen Commonly known as:  NOVOLOG 70/30 MIX INJECT 10 UNITS BEFORE BREAKFAST AND 6 UNITS BEFORE SUPPER (PEN EXPIRES 14 DAYS  AFTER OPENING)   losartan 50 MG tablet Commonly known as:  COZAAR   metFORMIN 1000 MG tablet Commonly known as:  GLUCOPHAGE Take 1 tablet (1,000 mg total) by mouth 2 (two) times daily with a meal.   simvastatin 40 MG tablet Commonly known as:  ZOCOR Take 1 tablet (40 mg total) by mouth every evening.   tamsulosin 0.4 MG Caps capsule Commonly known as:  FLOMAX Take 1 capsule (0.4 mg total) by mouth daily.   torsemide 20 MG tablet Commonly known as:  DEMADEX Take 0.5 tablets (10 mg total) by mouth as needed.   TRUEPLUS LANCETS 28G Misc Use to test blood sugar 2 times daily       Allergies:  Allergies  Allergen Reactions  . Lasix [Furosemide] Other (See Comments)    Constipation    Past Medical History:  Diagnosis Date  . Allergy   . Anemia   . Asthma   . BPH (benign prostatic hyperplasia)   . CAD (coronary artery disease)    a. s/p MI 09/1993;  b. known CTO of RCA;  c. 06/2014 Cath: LM short/mod dzs, LAD 95ost, LCX nl, RI nl, RCA 100, EF 55%;  c. 06/2014 s/p CABG x 4: LIMA->LAD, VG->D1, VG->OM1, VG->Acute Marginal.  . Carotid stenosis    a. Carotid US 8/46:  RICA 96-29%; LICA 52-84% >> FU 1 year  . CKD (chronic kidney disease), stage III ([place])   . Constipation    in the past 2-3 weeks  . Diabetes ([place])    AODM  . Diastolic dysfunction    a. 05/2015 Echo: EF 60-65%, LVH, Gr 1 DD, mild MR, mod dil RA/LA, mod TR, PASP 58mHg.  . Essential hypertension   . GERD (gastroesophageal reflux disease)   . Hyperlipidemia     Past Surgical History:  Procedure Laterality Date  . ANGIOPLASTY  09/22/1993   POBA of RCA (Dr. RMarella Chimes  . BACK SURGERY  2000   back fusion - Dr. HEarnie Larsson . CARDIAC CATHETERIZATION  11/28/1998   "silent occlusion" of RCA w/collaterals from left coronary system, prox LAD w/40-50% eccentric narrowing, 1st diagonal with 70-80% eccentric narrowing, 85% narrowing of prox small OM1 (Dr. RMarella Chimes  . CAROTID DOPPLER  07/2011   left subclavian  (50-69%); right bulb (0-49%); RICA (normal); left mid-distal CCA (0-49%); left bulb/prox ICA (50-69%); left vertebral with abnormal antegrade flow  . CORONARY ARTERY  BYPASS GRAFT N/A 05/21/2014   Procedure: CORONARY ARTERY BYPASS GRAFTING (CABG) x  four, using left internal mammary artery and right leg greater saphenous vein harvested endoscopically;  Surgeon: Melrose Nakayama, MD;  Location: [place];  Service: Open Heart Surgery;  Laterality: N/A;  . INTRAOPERATIVE TRANSESOPHAGEAL ECHOCARDIOGRAM N/A 05/21/2014   Procedure: INTRAOPERATIVE TRANSESOPHAGEAL ECHOCARDIOGRAM;  Surgeon: Melrose Nakayama, MD;  Location: [place];  Service: Open Heart Surgery;  Laterality: N/A;  . LEFT HEART CATHETERIZATION WITH CORONARY ANGIOGRAM N/A 05/19/2014   Procedure: LEFT HEART CATHETERIZATION WITH CORONARY ANGIOGRAM;  Surgeon: Blane Ohara, MD;  Location: [facility] C[facility] LAB;  Service: Cardiovascular;  Laterality: N/A;  . NASAL SINUS SURGERY  2010  . NM MYOCAR PERF WALL MOTION  09/19/2009   bruce myoview - mild perfusion defect in basal inferior region (infarct/scar), EF 60%, low risk scan  . RENAL DOPPLER  10/2011   SMA w/ 70-99% diameter reduction & high grade stenosis; R&L renals w/narrowing and increased velocities (60-99%), R kidney smaller than L  . TRANSTHORACIC ECHOCARDIOGRAM  10/20/2012   EF 55-60%, moderate concentric hypertrophy, ventricular septum thickness increased, calcified MV annulus  . VASECTOMY  1963    Family History  Problem Relation Age of Onset  . Uterine cancer Mother   . Stroke Father   . Diabetes Sister   . Colon polyps Brother   . Heart disease Brother   . Colon cancer Neg Hx   . Esophageal cancer Neg Hx   . Rectal cancer Neg Hx   . Stomach cancer Neg Hx     Social History:  reports that he quit smoking about 45 years ago. His smoking use included cigarettes. His smokeless tobacco use includes snuff. He reports that he drinks about 3.0 standard drinks of alcohol per week. He  reports that he does not use drugs.   Review of Systems   Lipid history: Taking simvastatin 40 mg, most recent LDL:     Lab Results  Component Value Date   LDLDIRECT 45.0 04/17/2017           Hypertension: Blood pressure is normal  Currently taking 50 mg of losartan from his cardiologist  BP Readings from Last 3 Encounters:  02/17/18 140/82  12/31/17 (!) 148/74  10/22/17 128/62   Renal function has been variable:  Lab Results  Component Value Date   CREATININE 1.49 10/16/2017   CREATININE 1.53 (H) 04/17/2017   CREATININE 1.57 (H) 02/28/2017   EDEMA: He has had a little more swelling of his legs and he says he is not taking his Demadex previously given by cardiologist  ANEMIA: He has been told to take iron, had significant anemia in June  Most recent eye exam was 10/17  Most recent foot exam: 8/18   LABS:  Office Visit on 02/17/2018  Component Date Value Ref Range Status  . Hemoglobin A1C 02/17/2018 6.2* 4.0 - 5.6 % Final    Physical Examination:  BP 140/82   Pulse 86   Ht _0  (1.778 m)   Wt 180 lb (81.6 kg)   SpO2 98%   BMI 25.83 kg/m   1+ lower leg edema present  Diabetic Foot Exam - Simple   Simple Foot Form Diabetic Foot exam was performed with the following findings:  Yes   Visual Inspection No deformities, no ulcerations, no other skin breakdown bilaterally:  Yes Sensation Testing Intact to touch and monofilament testing bilaterally:  Yes Pulse Check Posterior Tibialis and Dorsalis pulse intact bilaterally:  Yes  Comments      ASSESSMENT:  Diabetes type 2, insulin requiring  See history of present illness for detailed discussion of current diabetes management, blood sugar patterns and problems identified  [name] today is 6.2 and improved  Has been on low-dose insulin along with metformin and Actos  Leg edema: He does not have any symptoms of CHF currently  Fatigue: We will check CBC again, currently on iron  PLAN:      Continue insulin twice a day  He can take 5 units at suppertime instead of 6 for now but if his morning sugars are low normal may need to consider stopping evening dose  However he needs to check his readings after evening meal to help decide on insulin  Also to check some readings after breakfast or lunch at times  He will discuss his edema with his cardiologist  Influenza vaccine given  Patient Instructions  Check blood sugars on waking up  3/7 days  Also check blood sugars about 2 hours after a meal and do this after different meals by rotation  Recommended blood sugar levels on waking up is 90-130 and about 2 hours after meal is 130-160  Please bring your blood sugar monitor to each visit, thank you  5 units at supper     Elayne Snare 02/17/2018, 1:01 PM   Note: This office note was prepared with Dragon voice recognition system technology. Any transcriptional errors that result from this process are unintentional.

## 2018-02-17 ENCOUNTER — Ambulatory Visit: Payer: Medicare HMO | Admitting: Endocrinology

## 2018-02-17 ENCOUNTER — Encounter: Payer: Self-pay | Admitting: Endocrinology

## 2018-02-17 VITALS — BP 140/82 | HR 86 | Ht 70.0 in | Wt 180.0 lb

## 2018-02-17 DIAGNOSIS — Z23 Encounter for immunization: Secondary | ICD-10-CM | POA: Diagnosis not present

## 2018-02-17 DIAGNOSIS — E782 Mixed hyperlipidemia: Secondary | ICD-10-CM | POA: Diagnosis not present

## 2018-02-17 DIAGNOSIS — D508 Other iron deficiency anemias: Secondary | ICD-10-CM

## 2018-02-17 DIAGNOSIS — E119 Type 2 diabetes mellitus without complications: Secondary | ICD-10-CM

## 2018-02-17 LAB — POCT GLYCOSYLATED HEMOGLOBIN (HGB A1C): Hemoglobin A1C: 6.2 % — AB (ref 4.0–5.6)

## 2018-02-17 LAB — LIPID PANEL
Cholesterol: 107 mg/dL (ref 0–200)
HDL: 52.4 mg/dL (ref >39.00)
LDL Cholesterol: 42 mg/dL (ref 0–99)
NonHDL: 54.25
Total CHOL/HDL Ratio: 2
Triglycerides: 59 mg/dL (ref 0.0–149.0)
VLDL: 11.8 mg/dL (ref 0.0–40.0)

## 2018-02-17 LAB — COMPREHENSIVE METABOLIC PANEL
ALT: 13 U/L (ref 0–53)
AST: 18 U/L (ref 0–37)
Albumin: 4.1 g/dL (ref 3.5–5.2)
Alkaline Phosphatase: 55 U/L (ref 39–117)
BUN: 24 mg/dL — ABNORMAL HIGH (ref 6–23)
CO2: 26 mEq/L (ref 19–32)
Calcium: 9.3 mg/dL (ref 8.4–10.5)
Chloride: 105 mEq/L (ref 96–112)
Creatinine, Ser: 1.39 mg/dL (ref 0.40–1.50)
GFR: 52.32 mL/min — ABNORMAL LOW (ref >60.00)
Glucose, Bld: 140 mg/dL — ABNORMAL HIGH (ref 70–99)
Potassium: 4.6 mEq/L (ref 3.5–5.1)
Sodium: 138 mEq/L (ref 135–145)
Total Bilirubin: 0.4 mg/dL (ref 0.2–1.2)
Total Protein: 7 g/dL (ref 6.0–8.3)

## 2018-02-17 LAB — URINALYSIS, ROUTINE W REFLEX MICROSCOPIC
Bilirubin Urine: NEGATIVE
Hgb urine dipstick: NEGATIVE
Ketones, ur: NEGATIVE
Leukocytes, UA: NEGATIVE
Nitrite: NEGATIVE
RBC / HPF: NONE SEEN (ref 0–?)
Specific Gravity, Urine: 1.01 (ref 1.000–1.030)
Total Protein, Urine: NEGATIVE
Urine Glucose: NEGATIVE
Urobilinogen, UA: 0.2 (ref 0.0–1.0)
WBC, UA: NONE SEEN (ref 0–?)
pH: 5.5 (ref 5.0–8.0)

## 2018-02-17 LAB — MICROALBUMIN / CREATININE URINE RATIO
Creatinine,U: 51.9 mg/dL
Microalb Creat Ratio: 1.3 mg/g (ref 0.0–30.0)
Microalb, Ur: <0.7 mg/dL (ref 0.0–1.9)

## 2018-02-17 LAB — CBC WITH DIFFERENTIAL/PLATELET
Basophils Absolute: 0 10*3/uL (ref 0.0–0.1)
Basophils Relative: 1 % (ref 0.0–3.0)
Eosinophils Absolute: 0.3 10*3/uL (ref 0.0–0.7)
Eosinophils Relative: 6.9 % — ABNORMAL HIGH (ref 0.0–5.0)
HCT: 32.1 % — ABNORMAL LOW (ref 39.0–52.0)
Hemoglobin: 10.6 g/dL — ABNORMAL LOW (ref 13.0–17.0)
Lymphocytes Relative: 20.1 % (ref 12.0–46.0)
Lymphs Abs: 0.9 10*3/uL (ref 0.7–4.0)
MCHC: 33.1 g/dL (ref 30.0–36.0)
MCV: 89.4 fl (ref 78.0–100.0)
Monocytes Absolute: 0.3 10*3/uL (ref 0.1–1.0)
Monocytes Relative: 7.7 % (ref 3.0–12.0)
Neutro Abs: 2.9 10*3/uL (ref 1.4–7.7)
Neutrophils Relative %: 64.3 % (ref 43.0–77.0)
Platelets: 151 10*3/uL (ref 150.0–400.0)
RBC: 3.59 Mil/uL — ABNORMAL LOW (ref 4.22–5.81)
RDW: 16.7 % — ABNORMAL HIGH (ref 11.5–15.5)
WBC: 4.5 10*3/uL (ref 4.0–10.5)

## 2018-02-17 NOTE — Patient Instructions (Signed)
Check blood sugars on waking up  3/7 days  Also check blood sugars about 2 hours after a meal and do this after different meals by rotation  Recommended blood sugar levels on waking up is 90-130 and about 2 hours after meal is 130-160  Please bring your blood sugar monitor to each visit, thank you  5 units at supper

## 2018-02-20 ENCOUNTER — Other Ambulatory Visit: Payer: Self-pay | Admitting: Endocrinology

## 2018-02-20 MED ORDER — PIOGLITAZONE HCL 15 MG PO TABS: 15.0000 mg | ORAL_TABLET | Freq: Every day | ORAL | 3 refills | Status: DC

## 2018-03-12 ENCOUNTER — Ambulatory Visit: Payer: Medicare HMO | Admitting: Family Medicine

## 2018-03-17 DIAGNOSIS — H2513 Age-related nuclear cataract, bilateral: Secondary | ICD-10-CM | POA: Diagnosis not present

## 2018-03-17 DIAGNOSIS — H52203 Unspecified astigmatism, bilateral: Secondary | ICD-10-CM | POA: Diagnosis not present

## 2018-03-17 DIAGNOSIS — H5203 Hypermetropia, bilateral: Secondary | ICD-10-CM | POA: Diagnosis not present

## 2018-03-17 DIAGNOSIS — H524 Presbyopia: Secondary | ICD-10-CM | POA: Diagnosis not present

## 2018-03-17 DIAGNOSIS — E119 Type 2 diabetes mellitus without complications: Secondary | ICD-10-CM | POA: Diagnosis not present

## 2018-03-31 ENCOUNTER — Ambulatory Visit (INDEPENDENT_AMBULATORY_CARE_PROVIDER_SITE_OTHER): Payer: Medicare HMO | Admitting: Cardiovascular Disease

## 2018-03-31 ENCOUNTER — Encounter: Payer: Self-pay | Admitting: Cardiovascular Disease

## 2018-03-31 VITALS — BP 182/68 | HR 57 | Ht 70.0 in | Wt 178.0 lb

## 2018-03-31 DIAGNOSIS — E1165 Type 2 diabetes mellitus with hyperglycemia: Secondary | ICD-10-CM | POA: Diagnosis not present

## 2018-03-31 DIAGNOSIS — I10 Essential (primary) hypertension: Secondary | ICD-10-CM | POA: Diagnosis not present

## 2018-03-31 DIAGNOSIS — I6523 Occlusion and stenosis of bilateral carotid arteries: Secondary | ICD-10-CM

## 2018-03-31 DIAGNOSIS — I2510 Atherosclerotic heart disease of native coronary artery without angina pectoris: Secondary | ICD-10-CM

## 2018-03-31 DIAGNOSIS — E782 Mixed hyperlipidemia: Secondary | ICD-10-CM | POA: Diagnosis not present

## 2018-03-31 MED ORDER — AMLODIPINE BESYLATE 5 MG PO TABS: 5.0000 mg | ORAL_TABLET | Freq: Every day | ORAL | 3 refills | Status: DC

## 2018-03-31 MED ORDER — SIMVASTATIN 20 MG PO TABS: 20.0000 mg | ORAL_TABLET | Freq: Every evening | ORAL | 3 refills | Status: DC
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
## 2018-03-31 NOTE — Patient Instructions (Addendum)
Medication Instructions:  1) START AMLODIPINE 5 mg daily 2) DECREASE SIMVASTATIN to 20 mg daily  Labwork: None  Testing/Procedures: None  Follow-Up: You have an appointment with the HTN CLINIC on 04/15/2018 at 10:00AM.  Your provider wants you to follow-up in: 1 year with Dr. Burt Knack. You will receive a reminder letter in the mail two months in advance. If you don't receive a letter, please call our office to schedule the follow-up appointment.    Any Other Special Instructions Will Be Listed Below (If Applicable).     If you need a refill on your cardiac medications before your next appointment, please call your pharmacy.

## 2018-04-15 ENCOUNTER — Ambulatory Visit (INDEPENDENT_AMBULATORY_CARE_PROVIDER_SITE_OTHER): Payer: Medicare HMO | Admitting: Pharmacist

## 2018-04-15 VITALS — BP 184/68 | HR 59

## 2018-04-15 DIAGNOSIS — I10 Essential (primary) hypertension: Secondary | ICD-10-CM | POA: Diagnosis not present

## 2018-04-15 MED ORDER — LOSARTAN POTASSIUM 100 MG PO TABS: 100.0000 mg | ORAL_TABLET | Freq: Every day | ORAL | 3 refills | Status: DC

## 2018-04-15 MED ORDER — AMLODIPINE BESYLATE 10 MG PO TABS: 10.0000 mg | ORAL_TABLET | Freq: Every day | ORAL | 3 refills | Status: DC

## 2018-04-15 NOTE — Patient Instructions (Addendum)
INCREASE your amlodipine from 5mg  to 10mg  daily. You can take 2 of your 5mg  tablets until you run out. Your next order of medications sent to you will be for the 10mg  dose - take 1 a day when you get this refill.  INCREASE your losartan from 50mg  to 100mg  daily in 3-4 days. You can take 2 of your 50mg  tablets until you run out. Your next order of medications sent to you will be for the 100mg  dose - take 1 a day when you get this refill.  Look for decaf coffee and tea  Take your losartan in the morning and amlodipine in the evening.  Call Megan in the blood pressure clinic with any concerns [phone]  Follow up in clinic for blood pressure check in 4 weeks. Monitor your blood pressure at home and bring in your home cuff and readings with you

## 2018-04-15 NOTE — Progress Notes (Signed)
Patient ID: Abhi Moccia Apodaca                 DOB: 1940/06/13                      MRN: [id number]     HPI: Edilberto Roosevelt Savas is a 77 y.o. male referred by Dr. Burt Knack to HTN clinic. PMH is significant for CAD s/p remote PCI in the 1990s and multivessel CABG in 2015, HTN, HLD, DM2, and CKD. He was seen in clinic 2 weeks ago and BP was elevated to 182/68. Pt was started on amlodipine '5mg'$  daily and presents today for follow up.  Pt presents today in good spirits. He brings in all of his medications with him. He reports some forgetfulness and memory problems. He lives alone but his daughter helps him order his medications from mail order pharmacy. He has been checking his BP at home for the past few days using a bicep cuff that he has had fora a few years. Home BP readings all elevated: 186/82, 223/79, 163/79, 191/86. He has had frequent headaches as well, particularly in the morning. Likely related to high BP readings. He rarely uses NSAIDs.  Current HTN meds: amlodipine '5mg'$  daily, losartan '50mg'$  daily  BP goal: <130/51mHg  Family History: Father with history of stroke, sister with history of diabetes.  Social History: Former smoker, quit in 1974, drinks red wine 3-4 days a week, denies illicit drug use.  Diet:  Breakfast - eggs, sausage Lunch - snacks on peanut butter crackers, rice cakes, peanuts Dinner - K and W Does not add salt to food, uses Mrs DDeliah [place] 1 cup of coffee and tea  Exercise: Goes to the gym 3-4 days a week and uses the machines, weights, walks, and stair stepper machine.  Home BP readings: 186/82, 223/79, 163/79, 191/86 using home bicep cuff he has had for a few years.  Wt Readings from Last 3 Encounters:  03/31/18 178 lb (80.7 kg)  02/17/18 180 lb (81.6 kg)  12/31/17 177 lb 12.8 oz (80.6 kg)   BP Readings from Last 3 Encounters:  03/31/18 (!) 182/68  02/17/18 140/82  12/31/17 (!) 148/74   Pulse Readings from Last 3 Encounters:  03/31/18 (!) 57    02/17/18 86  12/31/17 (!) 56    Renal function: CrCl cannot be calculated (Patient's most recent lab result is older than the maximum 21 days allowed.).  Past Medical History:  Diagnosis Date  . Allergy   . Anemia   . Asthma   . BPH (benign prostatic hyperplasia)   . CAD (coronary artery disease)    a. s/p MI 09/1993;  b. known CTO of RCA;  c. 06/2014 Cath: LM short/mod dzs, LAD 95ost, LCX nl, RI nl, RCA 100, EF 55%;  c. 06/2014 s/p CABG x 4: LIMA->LAD, VG->D1, VG->OM1, VG->Acute Marginal.  . Carotid stenosis    a. Carotid [place]  RICA 459-56% LICA 638-75%>> FU 1 year  . CKD (chronic kidney disease), stage III [place]   . Constipation    in the past 2-3 weeks  . Diabetes ([place]    AODM  . Diastolic dysfunction    a. 05/2015 Echo: EF 60-65%, LVH, Gr 1 DD, mild MR, mod dil RA/LA, mod TR, PASP 447mg.  . Essential hypertension   . GERD (gastroesophageal reflux disease)   . Hyperlipidemia     Current Outpatient Medications on File Prior to Visit  Medication Sig  Dispense Refill  . ACCU-CHEK GUIDE test strip CHECK BLOOD SUGAR 2 TIMES DAILY 200 each 2  . amLODipine (NORVASC) 5 MG tablet Take 1 tablet (5 mg total) by mouth daily. 90 tablet 3  . aspirin 81 MG tablet Take 81 mg by mouth daily.    . B-D UF III MINI PEN NEEDLES 31G X 5 MM MISC USE AS DIRECTED TWICE DAILY 180 each 1  . glucose blood (ACCU-CHEK AVIVA PLUS) test strip Check blood glucose twice daily Dx code E11.65 100 each 2  . glucose blood (ACCU-CHEK GUIDE) test strip Use to check blood sugars 2 times daily 50 each 2  . insulin aspart protamine - aspart (NOVOLOG 70/30 MIX) (70-30) 100 UNIT/ML FlexPen Inject into the skin. INJECT 10 UNITS BEFORE BREAKFAST AND 5 UNITS BEFORE SUPPER (PEN EXPIRES 14 DAYS AFTER OPENING)    . Lancets Misc. (ACCU-CHEK SOFTCLIX LANCET DEV) KIT     . losartan (COZAAR) 50 MG tablet     . metFORMIN (GLUCOPHAGE) 1000 MG tablet Take 1 tablet (1,000 mg total) by mouth 2 (two) times daily with a meal. 180  tablet 3  . Multiple Vitamins-Minerals (CENTRUM SILVER ADULT 50+ PO) Take by mouth.    . pioglitazone (ACTOS) 15 MG tablet Take 1 tablet (15 mg total) by mouth daily. 90 tablet 3  . simvastatin (ZOCOR) 20 MG tablet Take 1 tablet (20 mg total) by mouth every evening. 90 tablet 3  . tamsulosin (FLOMAX) 0.4 MG CAPS capsule Take 1 capsule (0.4 mg total) by mouth daily. 30 capsule 3  . torsemide (DEMADEX) 20 MG tablet Take 0.5 tablets (10 mg total) by mouth as needed. (Patient not taking: Reported on 03/29/2017)    . TRUEPLUS LANCETS 28G MISC Use to test blood sugar 2 times daily 210 each 4   No current facility-administered medications on file prior to visit.     Allergies  Allergen Reactions  . Lasix [Furosemide] Other (See Comments)    Constipation     Assessment/Plan:  1. Hypertension - BP remains elevated far above goal <130/35mHg. Will increase amlodipine to '10mg'$  daily and increase losartan to '100mg'$  daily. Advised pt to split apart his medications and take amlodipine in the AM and losartan in the PM. He will increase his losartan dose a few days after amlodipine dose increase to help with tolerability. Encouraged pt to continue remaining active and to look for caffeine free tea and coffee. F/u in HTN clinic in 3 weeks for BP check and BMET. Advised him to bring in home BP cuff and readings to f/u visit.    E. Supple, PharmD, BCACP, CS[place] [place] [id number]: [phone] Fax: [phone]/05/2018 10:31 AM

## 2018-04-22 ENCOUNTER — Encounter: Payer: Self-pay | Admitting: Family Medicine

## 2018-04-22 ENCOUNTER — Ambulatory Visit: Payer: Medicare HMO | Admitting: Family Medicine

## 2018-04-22 VITALS — BP 148/88 | HR 54 | Temp 98.3°F | Ht 70.0 in | Wt 176.0 lb

## 2018-04-22 DIAGNOSIS — E782 Mixed hyperlipidemia: Secondary | ICD-10-CM

## 2018-04-22 DIAGNOSIS — I10 Essential (primary) hypertension: Secondary | ICD-10-CM | POA: Diagnosis not present

## 2018-04-22 DIAGNOSIS — E1165 Type 2 diabetes mellitus with hyperglycemia: Secondary | ICD-10-CM | POA: Diagnosis not present

## 2018-04-22 DIAGNOSIS — D508 Other iron deficiency anemias: Secondary | ICD-10-CM | POA: Diagnosis not present

## 2018-04-22 LAB — CBC
HCT: 37 % — ABNORMAL LOW (ref 39.0–52.0)
Hemoglobin: 12.1 g/dL — ABNORMAL LOW (ref 13.0–17.0)
MCHC: 32.7 g/dL (ref 30.0–36.0)
MCV: 94 fl (ref 78.0–100.0)
Platelets: 171 10*3/uL (ref 150.0–400.0)
RBC: 3.94 Mil/uL — ABNORMAL LOW (ref 4.22–5.81)
RDW: 16.6 % — ABNORMAL HIGH (ref 11.5–15.5)
WBC: 5.3 10*3/uL (ref 4.0–10.5)

## 2018-04-22 NOTE — Progress Notes (Signed)
   Subjective:  Justin Salinas is a 77 y.o. male who presents today with a chief complaint of HTN.   HPI:  HTN Patient was last seen here 4 months ago for this.  At that time blood pressures decently controlled on losartan 50 mg daily.  Since his last visit, he has had some difficulty managing his blood pressure.  Since his last visit, his cardiologist has increased his losartan to 100 mg daily, and additionally started him to amlodipine 10 mg daily.  He has been on both these medications at this dose for about a week.  Blood pressure seems to be improving.  He does report several dietary indiscretions including increased salt intake over the last several weeks, however states that he has stopped doing that a week or 2 ago.    T2DM Follows with endocrinology for this.  Currently on Actos, acarbose and NovoLog 70/30 twice daily.  He is also on metformin, however recently units once per day.  Sugars have been elevated to the upper 100s over the last week or 2.  Iron Deficiency Currently on ferrous sulfate 325 mg every other day.  Also tries to eat diet high in iron.  His fatigue levels are about the same.  ROS: Per HPI  PMH: He reports that he quit smoking about 45 years ago. His smoking use included cigarettes. His smokeless tobacco use includes snuff. He reports that he drinks about 3.0 standard drinks of alcohol per week. He reports that he does not use drugs.  Objective:  Physical Exam: BP (!) 148/88   Pulse (!) 54   Temp 98.3 F (36.8 C) (Oral)   Ht 5\' 10"  (1.778 m)   Wt 176 lb (79.8 kg)   SpO2 98%   BMI 25.25 kg/m   Gen: NAD, resting comfortably CV: RRR with no murmurs appreciated Pulm: NWOB, CTAB with no crackles, wheezes, or rhonchi MSK: No edema, cyanosis, or clubbing noted Skin: Warm, dry Neuro: Grossly normal, moves all extremities Psych: Normal affect and thought content  Assessment/Plan:  Uncontrolled type 2 diabetes mellitus with hyperglycemia, without long-term  current use of insulin (HCC) Reviewed medication regimen with patient.  He is only taking metformin once daily-advised him he should be taking this twice daily.  Patient voiced understanding.  He will continue his other medications as prescribed.  He will follow-up with endocrinology in a month.  Iron deficiency anemia Continue ferrous sulfate.  Check CBC and iron panel today.  HLD (hyperlipidemia) Last LDL 42 two months ago.  Continue simvastatin 10 mg daily.  Essential hypertension Much improved compared to a week ago.  Continue Norvasc 10 mg daily and losartan 100 mg daily.  Discussed reasons to return to care.  [name]. [name], MD 04/22/2018 9:39 AM

## 2018-04-22 NOTE — Assessment & Plan Note (Signed)
Reviewed medication regimen with patient.  He is only taking metformin once daily-advised him he should be taking this twice daily.  Patient voiced understanding.  He will continue his other medications as prescribed.  He will follow-up with endocrinology in a month.

## 2018-04-22 NOTE — Assessment & Plan Note (Signed)
Last LDL 42 two months ago.  Continue simvastatin 10 mg daily.

## 2018-04-22 NOTE — Assessment & Plan Note (Signed)
Much improved compared to a week ago.  Continue Norvasc 10 mg daily and losartan 100 mg daily.  Discussed reasons to return to care.

## 2018-04-22 NOTE — Patient Instructions (Signed)
It was very nice to see you today!  I am sure you are taking metformin 1000 mg twice daily.  Please keep a close eye on your blood pressures.  Let us know if it is not improving over the next 1 to 2 weeks.  We will check blood work today.  No other changes.  Come back to see me in 6 months for your annual physical, or sooner as needed.  Take care, Dr [name]

## 2018-04-22 NOTE — Assessment & Plan Note (Signed)
Continue ferrous sulfate.  Check CBC and iron panel today.

## 2018-04-23 LAB — IRON,TIBC AND FERRITIN PANEL
%SAT: 52 % (calc) — ABNORMAL HIGH (ref 20–48)
Ferritin: 19 ng/mL — ABNORMAL LOW (ref 24–380)
Iron: 165 ug/dL (ref 50–180)
TIBC: 319 mcg/dL (calc) (ref 250–425)

## 2018-04-24 NOTE — Progress Notes (Signed)
Please inform patient of the following:  His iron levels are improving, but are still low. Would like for him to continue taking ferrous sulfate 325mg  every other day and we can recheck again in 3-6 months.  [name]. [name], MD 04/24/2018 12:46 PM

## 2018-04-30 ENCOUNTER — Other Ambulatory Visit: Payer: Self-pay | Admitting: Family Medicine

## 2018-04-30 NOTE — Telephone Encounter (Signed)
Requested medication (s) are due for refill today: ?  Requested medication (s) are on the active medication list: yes  Last refill:  04/15/18  Future visit scheduled: no  Notes to clinic:  Historical provider    Requested Prescriptions  Pending Prescriptions Disp Refills   acarbose (PRECOSE) 25 MG tablet      Sig: Take 1 tablet (25 mg total) by mouth 3 (three) times daily with meals.     Endocrinology:  Diabetes - Alpha Glucosidase Inhibitors - acarbose Passed - 04/30/2018 10:47 AM      Passed - HBA1C is between 0 and 7.9 and within 180 days    Hemoglobin A1C  Date Value Ref Range Status  02/17/2018 6.2 (A) 4.0 - 5.6 % Final  12/03/2016 9.7  Final         Passed - AST in normal range and within 90 days    AST  Date Value Ref Range Status  02/17/2018 18 0 - 37 U/L Final         Passed - ALT in normal range and within 90 days    ALT  Date Value Ref Range Status  02/17/2018 13 0 - 53 U/L Final         Passed - Valid encounter within last 6 months    Recent Outpatient Visits          1 week ago Essential hypertension   [place] [facility], Iron Post, MD   4 months ago Benign prostatic hyperplasia, unspecified whether lower urinary tract symptoms present   [place] [facility], [name], MD   6 months ago Normocytic anemia   [facility], [name], MD   7 months ago Encounter for general adult medical examination with abnormal findings   [place] [facility], [name], MD   10 months ago Benign prostatic hyperplasia, unspecified whether lower urinary tract symptoms present   [place] [facility], [name], MD      Future Appointments            In 5 months [name], [name], MD [place]

## 2018-04-30 NOTE — Telephone Encounter (Signed)
Copied from [place] [id number]. Topic: Quick Communication - See Telephone Encounter >> Apr 30, 2018 10:01 AM Ivar Drape wrote: CRM for notification. See Telephone encounter for: 04/30/18. Patient would like a refill on his acarbose (PRECOSE) 25 MG tablet medication and have it sent to his preferred pharmacy [facility] mail order.

## 2018-04-30 NOTE — Telephone Encounter (Signed)
See note

## 2018-05-02 ENCOUNTER — Encounter

## 2018-05-05 MED ORDER — ACARBOSE 25 MG PO TABS: 25.0000 mg | ORAL_TABLET | Freq: Three times a day (TID) | ORAL | 5 refills | Status: DC

## 2018-05-05 NOTE — Telephone Encounter (Signed)
Please advise.  Historical provider. 

## 2018-05-06 ENCOUNTER — Ambulatory Visit (INDEPENDENT_AMBULATORY_CARE_PROVIDER_SITE_OTHER): Payer: Medicare HMO | Admitting: Pharmacist

## 2018-05-06 VITALS — BP 142/68 | HR 60 | Wt 178.0 lb

## 2018-05-06 DIAGNOSIS — I10 Essential (primary) hypertension: Secondary | ICD-10-CM | POA: Diagnosis not present

## 2018-05-06 DIAGNOSIS — H52209 Unspecified astigmatism, unspecified eye: Secondary | ICD-10-CM | POA: Diagnosis not present

## 2018-05-06 DIAGNOSIS — H524 Presbyopia: Secondary | ICD-10-CM | POA: Diagnosis not present

## 2018-05-06 DIAGNOSIS — H5203 Hypermetropia, bilateral: Secondary | ICD-10-CM | POA: Diagnosis not present

## 2018-05-06 LAB — BASIC METABOLIC PANEL
BUN/Creatinine Ratio: 16 (ref 10–24)
BUN: 25 mg/dL (ref 8–27)
CO2: 22 mmol/L (ref 20–29)
Calcium: 9.5 mg/dL (ref 8.6–10.2)
Chloride: 103 mmol/L (ref 96–106)
Creatinine, Ser: 1.53 mg/dL — ABNORMAL HIGH (ref 0.76–1.27)
GFR calc Af Amer: 49 mL/min/{1.73_m2} — ABNORMAL LOW (ref >59)
GFR calc non Af Amer: 43 mL/min/{1.73_m2} — ABNORMAL LOW (ref >59)
Glucose: 107 mg/dL — ABNORMAL HIGH (ref 65–99)
Potassium: 4.6 mmol/L (ref 3.5–5.2)
Sodium: 141 mmol/L (ref 134–144)

## 2018-05-06 NOTE — Patient Instructions (Addendum)
It was nice to see you today  Your blood pressure is looking much better. We would like it below 130/80  Continue taking losartan 100mg  daily (2 of your 50mg  tablets each evening), then start on the refill you got for the 100mg  tablets (1 tablet each evening)  Continue taking amlodipine 10mg  daily (2 of your 5mg  tablets each morning), then start on the refill you got for the 10mg  tablets (1 tablet each morning)  We will check your lab work today and plan to start a new medication for your blood pressure (either chlorthalidone or spironolactone depending on how your labs look)  Follow up in clinic for a blood pressure check on Friday, December 27th at 8:30am. Please bring in your medications to this visit and your home blood pressure readings

## 2018-05-06 NOTE — Progress Notes (Addendum)
Patient ID: [name]                 DOB: [birth year]                      MRN: [id number]     HPI: Justin Salinas is a 77 y.o. male referred by Dr. Burt Knack to HTN clinic. PMH is significant for CAD s/p remote PCI in the 1990s and multivessel CABG in 2015, HTN, HLD, DM2, and CKD. He was seen in pharmacy clinic 3 weeks ago and BP was elevated at 184/68. Amlodipine was increased to '10mg'$  daily and losartan was increased to '100mg'$  daily.   Pt presents today in good spirits. He brings in all of his medications with him. He reports some forgetfulness and memory problems. He lives alone but his daughter helps him order his medications from mail order pharmacy. He has been checking his BP at home using a bicep cuff that he has had for a a few years. Home BP readings are improved from 119-147 systolic to 829-562 mostly with 1 high of 195 in the past week. He continues to have some headaches in the AM although they are a bit improved. He has switched to decaf tea/coffee. He rarely uses NSAIDs. Reports CBGs have increased a bit - 14 day average 134. Eating more kale per PCP due to anemia. He is sleeping well ~9 hours a night. Mild LEE.  Current HTN meds: amlodipine '10mg'$  daily (AM), losartan '100mg'$  daily (PM)  BP goal: <130/2mHg  Family History: Father with history of stroke, sister with history of diabetes.  Social History: Former smoker, quit in 1974, drinks red wine 3-4 days a week, denies illicit drug use.  Diet:  Breakfast - eggs, sausage or oatmeal Lunch - snacks on peanut butter crackers, rice cakes, peanuts Dinner - K and W Does not add salt to food, uses Mrs DDeliah Boston1Danube 1 cup of coffee and tea  Exercise: Goes to the gym 3-4 days a week and uses the machines, weights, walks, and stair stepper machine.  Home BP readings: 186/82, 223/79, 163/79, 191/86 using home bicep cuff he has had for a few years.  Wt Readings from Last 3 Encounters:  04/22/18 176 lb (79.8 kg)    03/31/18 178 lb (80.7 kg)  02/17/18 180 lb (81.6 kg)   BP Readings from Last 3 Encounters:  04/22/18 (!) 148/88  04/15/18 (!) 184/68  03/31/18 (!) 182/68   Pulse Readings from Last 3 Encounters:  04/22/18 (!) 54  04/15/18 (!) 59  03/31/18 (!) 57    Renal function: CrCl cannot be calculated (Patient's most recent lab result is older than the maximum 21 days allowed.).  Past Medical History:  Diagnosis Date  . Allergy   . Anemia   . Asthma   . BPH (benign prostatic hyperplasia)   . CAD (coronary artery disease)    a. s/p MI 09/1993;  b. known CTO of RCA;  c. 06/2014 Cath: LM short/mod dzs, LAD 95ost, LCX nl, RI nl, RCA 100, EF 55%;  c. 06/2014 s/p CABG x 4: LIMA->LAD, VG->D1, VG->OM1, VG->Acute Marginal.  . Carotid stenosis    a. Carotid [place]  RICA 486-57% LICA 684-69%>> FU 1 year  . CKD (chronic kidney disease), stage III ([place]   . Constipation    in the past 2-3 weeks  . Diabetes ([place]    AODM  . Diastolic dysfunction    a. 05/2015  Echo: EF 60-65%, LVH, Gr 1 DD, mild MR, mod dil RA/LA, mod TR, PASP 47mHg.  . Essential hypertension   . GERD (gastroesophageal reflux disease)   . Hyperlipidemia     Current Outpatient Medications on File Prior to Visit  Medication Sig Dispense Refill  . acarbose (PRECOSE) 25 MG tablet Take 1 tablet (25 mg total) by mouth 3 (three) times daily with meals. 90 tablet 5  . ACCU-CHEK GUIDE test strip CHECK BLOOD SUGAR 2 TIMES DAILY 200 each 2  . alfuzosin (UROXATRAL) 10 MG 24 hr tablet Take 10 mg by mouth daily with breakfast.    . amLODipine (NORVASC) 10 MG tablet Take 1 tablet (10 mg total) by mouth daily. 90 tablet 3  . aspirin 81 MG tablet Take 81 mg by mouth daily.    . B-D UF III MINI PEN NEEDLES 31G X 5 MM MISC USE AS DIRECTED TWICE DAILY 180 each 1  . glucose blood (ACCU-CHEK AVIVA PLUS) test strip Check blood glucose twice daily Dx code E11.65 100 each 2  . glucose blood (ACCU-CHEK GUIDE) test strip Use to check blood sugars 2  times daily 50 each 2  . insulin aspart protamine - aspart (NOVOLOG 70/30 MIX) (70-30) 100 UNIT/ML FlexPen Inject into the skin. INJECT 10 UNITS BEFORE BREAKFAST AND 5 UNITS BEFORE SUPPER (PEN EXPIRES 14 DAYS AFTER OPENING)    . Lancets Misc. (ACCU-CHEK SOFTCLIX LANCET DEV) KIT     . losartan (COZAAR) 100 MG tablet Take 1 tablet (100 mg total) by mouth daily. 90 tablet 3  . metFORMIN (GLUCOPHAGE) 1000 MG tablet Take 1 tablet (1,000 mg total) by mouth 2 (two) times daily with a meal. 180 tablet 3  . Multiple Vitamins-Minerals (CENTRUM SILVER ADULT 50+ PO) Take by mouth.    . pioglitazone (ACTOS) 15 MG tablet Take 1 tablet (15 mg total) by mouth daily. 90 tablet 3  . simvastatin (ZOCOR) 20 MG tablet Take 1 tablet (20 mg total) by mouth every evening. 90 tablet 3  . TRUEPLUS LANCETS 28G MISC Use to test blood sugar 2 times daily 210 each 4   No current facility-administered medications on file prior to visit.     Allergies  Allergen Reactions  . Lasix [Furosemide] Other (See Comments)    Constipation     Assessment/Plan:  1. Hypertension - BP is much improved from last visit and closer to goal <130/870mg. Will check BMET today on higher dose of losartan and plan to start either chlorthalidone 12.'5mg'$  daily or spironolactone 12.'5mg'$  daily pending BMET results. Will use [place] 1[place] month and switch [facility] once final dose is determined per pt preference. Will continue losartan '100mg'$  PM and amlodipine '10mg'$  AM. F/u in HTN clinic in 3 weeks for BP check and BMET. Advised him to bring in home BP cuff and readings to f/u visit.   Terez Freimark E. Janzen Sacks, PharmD, BCACP, CPOwensville11610. [address] [address]hone: [phone]: [phone]/08/2017 8:12 AM  Addendum: BMET stable, will start spironolactone 12.'5mg'$  daily and keep f/u as scheduled. Pt is aware.

## 2018-05-07 ENCOUNTER — Telehealth: Payer: Self-pay | Admitting: Pharmacist

## 2018-05-07 MED ORDER — SPIRONOLACTONE 25 MG PO TABS: 12.5000 mg | ORAL_TABLET | Freq: Every day | ORAL | 11 refills | Status: DC

## 2018-05-07 NOTE — Telephone Encounter (Signed)
BMET stable from yesterday - will start spironolactone 12.5mg  daily. Pt is aware and will keep f/u in HTN clinic in 3 weeks for BP check and BMET.

## 2018-05-19 ENCOUNTER — Encounter: Payer: Self-pay | Admitting: Endocrinology

## 2018-05-19 ENCOUNTER — Ambulatory Visit: Payer: Medicare HMO | Admitting: Endocrinology

## 2018-05-19 VITALS — BP 146/64 | HR 65 | Ht 70.0 in | Wt 176.2 lb

## 2018-05-19 DIAGNOSIS — E119 Type 2 diabetes mellitus without complications: Secondary | ICD-10-CM | POA: Diagnosis not present

## 2018-05-19 LAB — POCT GLYCOSYLATED HEMOGLOBIN (HGB A1C): Hemoglobin A1C: 6.5 % — AB (ref 4.0–5.6)

## 2018-05-19 NOTE — Progress Notes (Signed)
Patient ID: Justin Salinas, male   DOB: [birth year], 77 y.o.   MRN: [id number]           Reason for Appointment: Follow-up for Type 2 Diabetes    History of Present Illness:          Date of diagnosis of type 2 diabetes mellitus:2000        Background history:   His diabetes had been mild initially and treated with metformin He had subsequently other medications added including Amaryl and Januvia No details are available from PCP office, [name] in 2015 was 7.6  Because of his poor control was started on insulin on 01/03/17. He has been on Actos since 06/2017  Recent history:   Insulin regimen: NovoLog mix, 10 units at breakfast and 5-6 at supper  Non-insulin hypoglycemic drugs the patient is taking are: Metformin 1 g twice a day, Actos 15 mg daily   He had a baseline A1c A1c 9.7, now is 6.5 bottom and about the same  Current management, blood sugar patterns and problems identified:  His insulin dose was reduced by 1 unit at dinnertime on the last visit because of low normal fasting readings  However he may still take 6 units if his blood sugars is higher at dinnertime  Despite reminders he does not check his sugars after meals at all despite reminders again  He is usually consistent with taking insulin before eating  [name]  He is still trying to be fairly active  Weight is about the same        Side effects from medications have been: None  Compliance with the medical regimen: Good  Glucose monitoring:  done 2 times a day         Glucometer:  Accu-Chek guide .      Blood Glucose readings by download:   PRE-MEAL Fasting Lunch Dinner Bedtime Overall  Glucose range:  95-195  99-223    Mean/median:  130   146   138   POST-MEAL PC Breakfast PC Lunch PC Dinner  Glucose range:  ?   Mean/median:       Previous readings:   PRE-MEAL Fasting Lunch Dinner Bedtime Overall  Glucose range:  75-146  82-178    Mean/median: 115  121     POST-MEAL PC  Breakfast PC Lunch PC Dinner  glucose range:   ?   Mean/median:        Self-care: The diet that the patient has been following is: tries to limit sweets, fried food       Typical meal intake: Breakfast is eggs meat, toast.  Lunch: Sandwich or.Crackers with or without peanut butter usually                Dietician visit, most recent: 03/2017               Exercise:   walking or other exercise at the gym 3/7 days for at least 40 min, usually morning   Weight history:   Wt Readings from Last 3 Encounters:  05/19/18 176 lb 3.2 oz (79.9 kg)  05/06/18 178 lb (80.7 kg)  04/22/18 176 lb (79.8 kg)    Glycemic control:   Lab Results  Component Value Date   HGBA1C 6.5 (A) 05/19/2018   HGBA1C 6.2 (A) 02/17/2018   HGBA1C 6.9 10/16/2017   Lab Results  Component Value Date   MICROALBUR <0.7 02/17/2018   LDLCALC 42 02/17/2018   CREATININE 1.53 (H) 05/06/2018  Lab Results  Component Value Date   MICRALBCREAT 1.3 02/17/2018   Microalbumin ratio 9 in 12/2015  Lab Results  Component Value Date   FRUCTOSAMINE 351 (H) 02/14/2017      Allergies as of 05/19/2018      Reactions   Lasix [furosemide] Other (See Comments)   Constipation      Medication List       Accurate as of May 19, 2018  8:41 AM. Always use your most recent med list.        acarbose 25 MG tablet Commonly known as:  PRECOSE Take 1 tablet (25 mg total) by mouth 3 (three) times daily with meals.   ACCU-CHEK SOFTCLIX LANCET DEV Kit   alfuzosin 10 MG 24 hr tablet Commonly known as:  UROXATRAL Take 10 mg by mouth daily with breakfast.   amLODipine 10 MG tablet Commonly known as:  NORVASC Take 1 tablet (10 mg total) by mouth daily.   aspirin 81 MG tablet Take 81 mg by mouth daily.   B-D UF III MINI PEN NEEDLES 31G X 5 MM Misc Generic drug:  Insulin Pen Needle USE AS DIRECTED TWICE DAILY   CENTRUM SILVER ADULT 50+ PO Take by mouth.   glucose blood test strip Commonly known as:  ACCU-CHEK  AVIVA PLUS Check blood glucose twice daily Dx code E11.65   glucose blood test strip Commonly known as:  ACCU-CHEK GUIDE Use to check blood sugars 2 times daily   ACCU-CHEK GUIDE test strip Generic drug:  glucose blood CHECK BLOOD SUGAR 2 TIMES DAILY   insulin aspart protamine - aspart (70-30) 100 UNIT/ML FlexPen Commonly known as:  NOVOLOG 70/30 MIX Inject into the skin. INJECT 10 UNITS BEFORE BREAKFAST AND 5 UNITS BEFORE SUPPER (PEN EXPIRES 14 DAYS AFTER OPENING)   losartan 100 MG tablet Commonly known as:  COZAAR Take 1 tablet (100 mg total) by mouth daily.   metFORMIN 1000 MG tablet Commonly known as:  GLUCOPHAGE Take 1 tablet (1,000 mg total) by mouth 2 (two) times daily with a meal.   pioglitazone 15 MG tablet Commonly known as:  ACTOS Take 1 tablet (15 mg total) by mouth daily.   simvastatin 20 MG tablet Commonly known as:  ZOCOR Take 1 tablet (20 mg total) by mouth every evening.   spironolactone 25 MG tablet Commonly known as:  ALDACTONE Take 0.5 tablets (12.5 mg total) by mouth daily.   TRUEPLUS LANCETS 28G Misc Use to test blood sugar 2 times daily       Allergies:  Allergies  Allergen Reactions  . Lasix [Furosemide] Other (See Comments)    Constipation    Past Medical History:  Diagnosis Date  . Allergy   . Anemia   . Asthma   . BPH (benign prostatic hyperplasia)   . CAD (coronary artery disease)    a. s/p MI 09/1993;  b. known CTO of RCA;  c. 06/2014 Cath: LM short/mod dzs, LAD 95ost, LCX nl, RI nl, RCA 100, EF 55%;  c. 06/2014 s/p CABG x 4: LIMA->LAD, VG->D1, VG->OM1, VG->Acute Marginal.  . Carotid stenosis    a. Carotid US 7/32:  RICA 20-25%; LICA 42-70% >> FU 1 year  . CKD (chronic kidney disease), stage III ([place])   . Constipation    in the past 2-3 weeks  . Diabetes ([place])    AODM  . Diastolic dysfunction    a. 05/2015 Echo: EF 60-65%, LVH, Gr 1 DD, mild MR, mod dil RA/LA, mod TR, PASP 54mHg.  .Marland Kitchen  Essential hypertension   . GERD  (gastroesophageal reflux disease)   . Hyperlipidemia     Past Surgical History:  Procedure Laterality Date  . ANGIOPLASTY  09/22/1993   POBA of RCA (Dr. Marella Chimes)  . BACK SURGERY  2000   back fusion - Dr. Earnie Larsson  . CARDIAC CATHETERIZATION  11/28/1998   "silent occlusion" of RCA w/collaterals from left coronary system, prox LAD w/40-50% eccentric narrowing, 1st diagonal with 70-80% eccentric narrowing, 85% narrowing of prox small OM1 (Dr. Marella Chimes)  . CAROTID DOPPLER  07/2011   left subclavian (50-69%); right bulb (0-49%); RICA (normal); left mid-distal CCA (0-49%); left bulb/prox ICA (50-69%); left vertebral with abnormal antegrade flow  . CORONARY ARTERY BYPASS GRAFT N/A 05/21/2014   Procedure: CORONARY ARTERY BYPASS GRAFTING (CABG) x  four, using left internal mammary artery and right leg greater saphenous vein harvested endoscopically;  Surgeon: Melrose Nakayama, MD;  Location: [place];  Service: Open Heart Surgery;  Laterality: N/A;  . INTRAOPERATIVE TRANSESOPHAGEAL ECHOCARDIOGRAM N/A 05/21/2014   Procedure: INTRAOPERATIVE TRANSESOPHAGEAL ECHOCARDIOGRAM;  Surgeon: Melrose Nakayama, MD;  Location: [place];  Service: Open Heart Surgery;  Laterality: N/A;  . LEFT HEART CATHETERIZATION WITH CORONARY ANGIOGRAM N/A 05/19/2014   Procedure: LEFT HEART CATHETERIZATION WITH CORONARY ANGIOGRAM;  Surgeon: Blane Ohara, MD;  Location: [facility] CATH LAB;  Service: Cardiovascular;  Laterality: N/A;  . NASAL SINUS SURGERY  2010  . NM MYOCAR PERF WALL MOTION  09/19/2009   bruce myoview - mild perfusion defect in basal inferior region (infarct/scar), EF 60%, low risk scan  . RENAL DOPPLER  10/2011   SMA w/ 70-99% diameter reduction & high grade stenosis; R&L renals w/narrowing and increased velocities (60-99%), R kidney smaller than L  . TRANSTHORACIC ECHOCARDIOGRAM  10/20/2012   EF 55-60%, moderate concentric hypertrophy, ventricular septum thickness increased, calcified MV annulus  . VASECTOMY   1963    Family History  Problem Relation Age of Onset  . Uterine cancer Mother   . Stroke Father   . Diabetes Sister   . Colon polyps Brother   . Heart disease Brother   . Colon cancer Neg Hx   . Esophageal cancer Neg Hx   . Rectal cancer Neg Hx   . Stomach cancer Neg Hx     Social History:  reports that he quit smoking about 45 years ago. His smoking use included cigarettes. His smokeless tobacco use includes snuff. He reports current alcohol use of about 3.0 standard drinks of alcohol per week. He reports that he does not use drugs.   Review of Systems   Lipid history: Taking simvastatin 40 mg, most recent LDL:     Lab Results  Component Value Date   CHOL 107 02/17/2018   HDL 52.40 02/17/2018   LDLCALC 42 02/17/2018   LDLDIRECT 45.0 04/17/2017   TRIG 59.0 02/17/2018   CHOLHDL 2 02/17/2018           Hypertension: Blood pressure is high normal  Treated by cardiology in the hypertension clinic and recently Aldactone also added  BP Readings from Last 3 Encounters:  05/19/18 (!) 146/64  05/06/18 (!) 142/68  04/22/18 (!) 148/88   Renal function has been variable and recently worse:  Lab Results  Component Value Date   CREATININE 1.53 (H) 05/06/2018   CREATININE 1.39 02/17/2018   CREATININE 1.49 10/16/2017   EDEMA: He has had a little more swelling of his legs and he says he is not taking his Demadex previously  given by cardiologist  ANEMIA: He has been told to take iron, had significant anemia in June  Most recent eye exam was 10/19  Most recent foot exam: 9/19   LABS:  Office Visit on 05/19/2018  Component Date Value Ref Range Status  . Hemoglobin A1C 05/19/2018 6.5* 4.0 - 5.6 % Final    Physical Examination:  BP (!) 146/64 (Patient Position: Sitting)   Pulse 65   Ht '5\' 10"'$  (1.778 m)   Wt 176 lb 3.2 oz (79.9 kg)   SpO2 98%   BMI 25.28 kg/m   1+ lower leg edema present    ASSESSMENT:  Diabetes type 2, insulin requiring  See history of  present illness for detailed discussion of current diabetes management, blood sugar patterns and problems identified  [name] today is 6.5  Considering his age and multiple medical problems his blood sugars are fairly well controlled with a simple regimen of low doses of premixed insulin along with metformin and Actos Since he has been on Actos for quite some time without any adverse effects he can continue this, has no history of CHF His blood sugars are variable at dinnertime based on what he is eating during the day and his activity level and still will get blood sugars as high as 223 before dinner  Leg edema: He is doing better with elastic stockings  HYPERTENSION and renal dysfunction: To follow-up with cardiology hypertension clinic later this month  PLAN:    He does need to check his sugars after meals instead of just breakfast and dinnertime We can try to do some readings at lunchtime to help adjust the morning dose Because of his renal dysfunction he will reduce his metformin to half a tablet in the evening To call if he has consistently high readings at dinnertime  Patient Instructions  Metformin 1/2 at dinner    Check blood sugars on waking up 3 days a week  Also check blood sugars about 2 hours after meals and do this after different meals by rotation  Recommended blood sugar levels on waking up are 90-130 and about 2 hours after meal is 130-160  Please bring your blood sugar monitor to each visit, thank you       Elayne Snare 05/19/2018, 8:41 AM   Note: This office note was prepared with Dragon voice recognition system technology. Any transcriptional errors that result from this process are unintentional.

## 2018-05-19 NOTE — Patient Instructions (Addendum)
Metformin 1/2 at dinner    Check blood sugars on waking up 3 days a week  Also check blood sugars about 2 hours after meals and do this after different meals by rotation  Recommended blood sugar levels on waking up are 90-130 and about 2 hours after meal is 130-160  Please bring your blood sugar monitor to each visit, thank you

## 2018-05-29 NOTE — Progress Notes (Addendum)
Patient ID: Justin Salinas                 DOB: 1940/10/19                      MRN: [id number]     HPI: Justin Salinas is a 77 y.o. male referred by Dr. Burt Knack to HTN clinic. PMH is significant for CAD s/p remote PCI in the 1990s and multivessel CABG in 2015, HTN, HLD, DM2, and CKD. Pt has been following in HTN clinic with improvement in BP from 184/68 to 142/68 with dose titrations of amlodipine and losartan. Most recently, he was started on spironolactone 12.26m daily.  Pt presents today in good spirits. He is tolerating his spironolactone well. He reports some forgetfulness and memory problems. He lives alone but his daughter helps him order his medications from mail order pharmacy. He has been checking his BP at home using a bicep cuff that he has had for about 15 years. Home BP readings remain elevated 140-180/60-80, HR 52-70. Clinic reading 162/64 x2, home cuff reading much higher at 189/75. He took his medications 1.5 hours ago.  He continues to have some headaches in the AM although they are a bit improved. He has switched to decaf tea/coffee. He rarely uses NSAIDs. Reports CBGs have increased a bit over the holidays. He is sleeping well ~9 hours a night. Mild LEE improved. [facility]  Current HTN meds: amlodipine 142mdaily (AM), losartan 10040maily (PM), spironolactone 12.5mg15mily (AM)  BP goal: <130/80mm48mFamily History: Father with history of stroke, sister with history of diabetes.  Social History: Former smoker, quit in 1974, drinks red wine 3-4 days a week, denies illicit drug use.  Diet:  Breakfast - eggs, sausage or oatmeal Lunch - snacks on peanut butter crackers, rice cakes, peanuts Dinner - K and W Does not add salt to food, uses Mrs Dash [name]  Exercise: Goes to the gym 3-4 days a week and uses the machines, weights, walks, and stair stepper machine.  Wt  Readings from Last 3 Encounters:  05/19/18 176 lb 3.2 oz (79.9 kg)  05/06/18 178 lb (80.7 kg)  04/22/18 176 lb (79.8 kg)   BP Readings from Last 3 Encounters:  05/19/18 (!) 146/64  05/06/18 (!) 142/68  04/22/18 (!) 148/88   Pulse Readings from Last 3 Encounters:  05/19/18 65  05/06/18 60  04/22/18 (!) 54    Renal function: CrCl cannot be calculated (Patient's most recent lab result is older than the maximum 21 days allowed.).  Past Medical History:  Diagnosis Date  . Allergy   . Anemia   . Asthma   . BPH (benign prostatic hyperplasia)   . CAD (coronary artery disease)    a. s/p MI 09/1993;  b. known CTO of RCA;  c. 06/2014 Cath: LM short/mod dzs, LAD 95ost, LCX nl, RI nl, RCA 100, EF 55%;  c. 06/2014 s/p CABG x 4: LIMA->LAD, VG->D1, VG->OM1, VG->Acute Marginal.  . Carotid stenosis    a. Carotid US 1/[place]:5/73CA [id number]%A 60-7954-27%U 1 year  . CKD (chronic kidney disease), stage III (HCC) [place] Constipation    in the past 2-3 weeks  . Diabetes (HCC) [place]  . Diastolic dysfunction    a. 05/2015 Echo: EF 60-65%, LVH, Gr 1 DD, mild MR,  mod dil RA/LA, mod TR, PASP 33mHg.  . Essential hypertension   . GERD (gastroesophageal reflux disease)   . Hyperlipidemia     Current Outpatient Medications on File Prior to Visit  Medication Sig Dispense Refill  . acarbose (PRECOSE) 25 MG tablet Take 1 tablet (25 mg total) by mouth 3 (three) times daily with meals. 90 tablet 5  . ACCU-CHEK GUIDE test strip CHECK BLOOD SUGAR 2 TIMES DAILY 200 each 2  . alfuzosin (UROXATRAL) 10 MG 24 hr tablet Take 10 mg by mouth daily with breakfast.    . amLODipine (NORVASC) 10 MG tablet Take 1 tablet (10 mg total) by mouth daily. 90 tablet 3  . aspirin 81 MG tablet Take 81 mg by mouth daily.    . B-D UF III MINI PEN NEEDLES 31G X 5 MM MISC USE AS DIRECTED TWICE DAILY 180 each 1  . glucose blood (ACCU-CHEK AVIVA PLUS) test strip Check blood glucose twice daily Dx code E11.65 100 each 2  . glucose blood  (ACCU-CHEK GUIDE) test strip Use to check blood sugars 2 times daily 50 each 2  . insulin aspart protamine - aspart (NOVOLOG 70/30 MIX) (70-30) 100 UNIT/ML FlexPen Inject into the skin. INJECT 10 UNITS BEFORE BREAKFAST AND 5 UNITS BEFORE SUPPER (PEN EXPIRES 14 DAYS AFTER OPENING)    . Lancets Misc. (ACCU-CHEK SOFTCLIX LANCET DEV) KIT     . losartan (COZAAR) 100 MG tablet Take 1 tablet (100 mg total) by mouth daily. 90 tablet 3  . metFORMIN (GLUCOPHAGE) 1000 MG tablet Take 1 tablet (1,000 mg total) by mouth 2 (two) times daily with a meal. 180 tablet 3  . Multiple Vitamins-Minerals (CENTRUM SILVER ADULT 50+ PO) Take by mouth.    . pioglitazone (ACTOS) 15 MG tablet Take 1 tablet (15 mg total) by mouth daily. 90 tablet 3  . simvastatin (ZOCOR) 20 MG tablet Take 1 tablet (20 mg total) by mouth every evening. 90 tablet 3  . spironolactone (ALDACTONE) 25 MG tablet Take 0.5 tablets (12.5 mg total) by mouth daily. 15 tablet 11  . TRUEPLUS LANCETS 28G MISC Use to test blood sugar 2 times daily 210 each 4   No current facility-administered medications on file prior to visit.     Allergies  Allergen Reactions  . Lasix [Furosemide] Other (See Comments)    Constipation     Assessment/Plan:  1. Hypertension - BP remains elevated above goal <130/849mg. Will check BMET today on spironolactone and if stable, plan to increase spironolactone to 2521maily.  Will continue losartan 100m33m and amlodipine 10mg52m Advised pt that home BP cuff overestimated systolic BP by ~25mm~67TIWPlinic today, so his elevated home readings may not be as elevated as they appear. Will f/u in HTN clinic in 3 weeks. Will send spironolactone to [facility] tit[facility] dose, then [facility] once final dose is determined per pt preference.  Lynae Pederson E. Lexa Coronado, PharmD, BCACP, CPP C[place] [id number]en[place]ardenne [id number]: [phone]: [phone] 06546/2019 8:08 AM   Addendum: K increased to 5.2, will stop spironolactone and start chlorthalidone 25mg 13my. Continue other meds and will recheck BMET at f/u visit in 2-3 weeks. Pt is aware of plan.

## 2018-05-30 ENCOUNTER — Ambulatory Visit (INDEPENDENT_AMBULATORY_CARE_PROVIDER_SITE_OTHER): Payer: Medicare HMO | Admitting: Pharmacist

## 2018-05-30 VITALS — BP 162/64 | HR 57

## 2018-05-30 DIAGNOSIS — I10 Essential (primary) hypertension: Secondary | ICD-10-CM

## 2018-05-30 LAB — BASIC METABOLIC PANEL
BUN/Creatinine Ratio: 22 (ref 10–24)
BUN: 33 mg/dL — ABNORMAL HIGH (ref 8–27)
CO2: 20 mmol/L (ref 20–29)
Calcium: 9.9 mg/dL (ref 8.6–10.2)
Chloride: 102 mmol/L (ref 96–106)
Creatinine, Ser: 1.53 mg/dL — ABNORMAL HIGH (ref 0.76–1.27)
GFR calc Af Amer: 49 mL/min/{1.73_m2} — ABNORMAL LOW (ref >59)
GFR calc non Af Amer: 43 mL/min/{1.73_m2} — ABNORMAL LOW (ref >59)
Glucose: 201 mg/dL — ABNORMAL HIGH (ref 65–99)
Potassium: 5.2 mmol/L (ref 3.5–5.2)
Sodium: 138 mmol/L (ref 134–144)

## 2018-05-30 MED ORDER — CHLORTHALIDONE 25 MG PO TABS: 25.0000 mg | ORAL_TABLET | Freq: Every day | ORAL | 11 refills | Status: DC

## 2018-05-30 NOTE — Patient Instructions (Addendum)
It was nice to see you today  Your blood pressure is a bit high today, your goal is < 130/45mmHg  We will check your labs today. If everything is stable, we will plan to increase your spironolactone from 1/2 tablet to 1 full tablet each day (25mg  total)  Continue taking your other medications  Check your blood pressure at home 2 hours after you take your medications   Follow up in clinic in 3 weeks for a blood pressure check

## 2018-05-30 NOTE — Addendum Note (Signed)
Addended by: SUPPLE, MEGAN E on: 05/30/2018 03:52 PM   Modules accepted: Orders

## 2018-06-19 ENCOUNTER — Ambulatory Visit (INDEPENDENT_AMBULATORY_CARE_PROVIDER_SITE_OTHER): Payer: Medicare HMO | Admitting: Pharmacist

## 2018-06-19 VITALS — BP 144/62 | HR 57

## 2018-06-19 DIAGNOSIS — I10 Essential (primary) hypertension: Secondary | ICD-10-CM

## 2018-06-19 LAB — BASIC METABOLIC PANEL
BUN/Creatinine Ratio: 22 (ref 10–24)
BUN: 36 mg/dL — ABNORMAL HIGH (ref 8–27)
CO2: 22 mmol/L (ref 20–29)
Calcium: 9.8 mg/dL (ref 8.6–10.2)
Chloride: 100 mmol/L (ref 96–106)
Creatinine, Ser: 1.63 mg/dL — ABNORMAL HIGH (ref 0.76–1.27)
GFR calc Af Amer: 46 mL/min/{1.73_m2} — ABNORMAL LOW (ref >59)
GFR calc non Af Amer: 39 mL/min/{1.73_m2} — ABNORMAL LOW (ref >59)
Glucose: 185 mg/dL — ABNORMAL HIGH (ref 65–99)
Potassium: 5 mmol/L (ref 3.5–5.2)
Sodium: 138 mmol/L (ref 134–144)

## 2018-06-19 NOTE — Progress Notes (Addendum)
Patient ID: Justin Salinas                 DOB: February 01, 1941                      MRN: [id number]     HPI: Justin Salinas is a 78 y.o. male referred by Dr. Burt Knack to HTN clinic. PMH is significant for CAD s/p remote PCI in the 1990s and multivessel CABG in 2015, HTN, HLD, DM2, and CKD. Pt has been following in HTN clinic with improvement in BP from 184/68 to 142/68 with dose titrations of amlodipine and losartan. When spironolactone 12.5 mg was initiated, K increased to 5.2, so spironolactone was switched to chlorthalidone 25 mg daily at the last visit. BP home cuff is 80 years old and previously measured 10 mmHg higher than the clinic cuff. Patient presents today for follow up and BMET recheck.  Patient is in good spirits. Patient denies vision changes, imbalance, headache, or dizziness. Patient reports compliance and tolerance to all his medications.  Patient brought his home BP log and home BP cuff ([place]). Using home cuff, BP reads 154/72, 158/63 HR 57 while clinic BP cuff reads 144/62. Re-iterated importance of salt-limiting diet. Discussed re-trying spironolactone or initiating hydralazine.  Current HTN meds: amlodipine '10mg'$  daily (PM), losartan '100mg'$  daily (AM), chlorthalidone 25 mg daily (AM)  Previously tried: spironolactone - hyperkalemia  BP goal: <130/13mHg  Family History: Father with history of stroke, sister with history of diabetes.  Social History: Former smoker, quit in 1974, drinks red wine 3-4 days a week, denies illicit drug use.  Diet:  Breakfast - eggs, sausage or oatmeal Lunch - snacks on peanut butter crackers, rice cakes, peanuts Dinner - K and W Does not add salt to food, uses Mrs DDeliah [place] 1 cup of decaf coffee and occasionally tea  Exercise: Goes to the gym 3-4 days a week for at least an hour each time and uses the machines, weights, walks, and stair stepper machine.  Home BP readings: Lowest BP 127/77 (06/19/18). Highest BP 192/84  (12/131/19). Average BP 160-170s/70-80s, HR 60s.  Wt Readings from Last 3 Encounters:  05/19/18 176 lb 3.2 oz (79.9 kg)  05/06/18 178 lb (80.7 kg)  04/22/18 176 lb (79.8 kg)   BP Readings from Last 3 Encounters:  05/30/18 (!) 162/64  05/19/18 (!) 146/64  05/06/18 (!) 142/68   Pulse Readings from Last 3 Encounters:  05/30/18 (!) 57  05/19/18 65  05/06/18 60    Renal function: CrCl cannot be calculated (Unknown ideal weight.).  Past Medical History:  Diagnosis Date  . Allergy   . Anemia   . Asthma   . BPH (benign prostatic hyperplasia)   . CAD (coronary artery disease)    a. s/p MI 09/1993;  b. known CTO of RCA;  c. 06/2014 Cath: LM short/mod dzs, LAD 95ost, LCX nl, RI nl, RCA 100, EF 55%;  c. 06/2014 s/p CABG x 4: LIMA->LAD, VG->D1, VG->OM1, VG->Acute Marginal.  . Carotid stenosis    a. Carotid [place]09  RICA 473-53% LICA 629-92%>> FU 1 year  . CKD (chronic kidney disease), stage III ([place]   . Constipation    in the past 2-3 weeks  . Diabetes ([place]    AODM  . Diastolic dysfunction    a. 05/2015 Echo: EF 60-65%, LVH, Gr 1 DD, mild MR, mod dil RA/LA, mod TR, PASP 469mg.  . Essential hypertension   .  GERD (gastroesophageal reflux disease)   . Hyperlipidemia     Current Outpatient Medications on File Prior to Visit  Medication Sig Dispense Refill  . acarbose (PRECOSE) 25 MG tablet Take 1 tablet (25 mg total) by mouth 3 (three) times daily with meals. 90 tablet 5  . ACCU-CHEK GUIDE test strip CHECK BLOOD SUGAR 2 TIMES DAILY 200 each 2  . alfuzosin (UROXATRAL) 10 MG 24 hr tablet Take 10 mg by mouth daily with breakfast.    . amLODipine (NORVASC) 10 MG tablet Take 1 tablet (10 mg total) by mouth daily. 90 tablet 3  . aspirin 81 MG tablet Take 81 mg by mouth daily.    . B-D UF III MINI PEN NEEDLES 31G X 5 MM MISC USE AS DIRECTED TWICE DAILY 180 each 1  . chlorthalidone (HYGROTON) 25 MG tablet Take 1 tablet (25 mg total) by mouth daily. 30 tablet 11  . glucose blood (ACCU-CHEK  AVIVA PLUS) test strip Check blood glucose twice daily Dx code E11.65 100 each 2  . glucose blood (ACCU-CHEK GUIDE) test strip Use to check blood sugars 2 times daily 50 each 2  . insulin aspart protamine - aspart (NOVOLOG 70/30 MIX) (70-30) 100 UNIT/ML FlexPen Inject into the skin. INJECT 10 UNITS BEFORE BREAKFAST AND 5 UNITS BEFORE SUPPER (PEN EXPIRES 14 DAYS AFTER OPENING)    . Lancets Misc. (ACCU-CHEK SOFTCLIX LANCET DEV) KIT     . losartan (COZAAR) 100 MG tablet Take 1 tablet (100 mg total) by mouth daily. 90 tablet 3  . metFORMIN (GLUCOPHAGE) 1000 MG tablet Take 1 tablet (1,000 mg total) by mouth 2 (two) times daily with a meal. 180 tablet 3  . Multiple Vitamins-Minerals (CENTRUM SILVER ADULT 50+ PO) Take by mouth.    . pioglitazone (ACTOS) 15 MG tablet Take 1 tablet (15 mg total) by mouth daily. 90 tablet 3  . simvastatin (ZOCOR) 20 MG tablet Take 1 tablet (20 mg total) by mouth every evening. 90 tablet 3  . TRUEPLUS LANCETS 28G MISC Use to test blood sugar 2 times daily 210 each 4   No current facility-administered medications on file prior to visit.     Allergies  Allergen Reactions  . Lasix [Furosemide] Other (See Comments)    Constipation     Assessment/Plan:  1. Hypertension - BP remains above goal <130/80 today, despite recent addition of chlorthalidone. Will either initiate spironolactone 12.'5mg'$  or hydralazine '10mg'$  TID depending on renal function and continue the rest of his current medications: amlodipine '10mg'$  daily (PM), losartan '100mg'$  daily (AM), chlorthalidone 25 mg daily (AM). Advised patient to continue checking BP at home. Will call pt tomorrow with medication plan and lab results. Will schedule f/u visit at that time.  Pt seen with Almira Bar, P4 pharmacy student.   E. Supple, PharmD, BCACP, [place] [address] [place], [place] [address] Phone: [phone]; Fax: [phone] 06/19/2018 4:04 PM

## 2018-06-19 NOTE — Patient Instructions (Addendum)
It was nice to see you today.  We will call you tomorrow with lab results. If your labs are stable, we will re-start spironolactone 1/2 tablet (12.5 mg) daily in the morning. If not, we will plan to start hydralazine 10 mg three times a day.  Your blood pressure goal is <130/80.  Continue checking blood pressure at home.

## 2018-06-23 ENCOUNTER — Telehealth: Payer: Self-pay | Admitting: Pharmacist

## 2018-06-23 MED ORDER — HYDRALAZINE HCL 10 MG PO TABS: 10.0000 mg | ORAL_TABLET | Freq: Three times a day (TID) | ORAL | 3 refills | Status: DC

## 2018-06-23 NOTE — Telephone Encounter (Addendum)
Spoke with pt to discuss BMET results - K decreased from 5.2 to 5 since stopping spironolactone and starting chlorthalidone. BP remained above goal, however will not resume spironolactone due to K. Will start hydralazine 10mg  TID. Recheck BP and BMET in 1 month.

## 2018-06-25 ENCOUNTER — Other Ambulatory Visit: Payer: Self-pay | Admitting: Endocrinology

## 2018-07-28 ENCOUNTER — Ambulatory Visit (INDEPENDENT_AMBULATORY_CARE_PROVIDER_SITE_OTHER): Payer: Medicare HMO | Admitting: Pharmacist

## 2018-07-28 VITALS — BP 148/54 | HR 62

## 2018-07-28 DIAGNOSIS — I10 Essential (primary) hypertension: Secondary | ICD-10-CM | POA: Diagnosis not present

## 2018-07-28 MED ORDER — HYDRALAZINE HCL 25 MG PO TABS: 25.0000 mg | ORAL_TABLET | Freq: Three times a day (TID) | ORAL | 3 refills | Status: DC

## 2018-07-28 MED ORDER — FUROSEMIDE 20 MG PO TABS: 20.0000 mg | ORAL_TABLET | ORAL | 3 refills | Status: DC | PRN

## 2018-07-28 NOTE — Progress Notes (Signed)
Patient ID: Justin Salinas                 DOB: August 07, 1940                      MRN: [id number]     HPI: Justin Salinas is a pleasant 78 y.o. male referred by Dr. Burt Knack to HTN clinic. PMH is significant for CAD s/p remote PCI in the 1990s and multivessel CABG in 2015, HTN, HLD, DM2, and CKD. Pt has been following in HTN clinic with improvement in BP from 184/68 to 142/68 with dose titrations of amlodipine and losartan. When spironolactone 12.5 mg was initiated, K increased to 5.2, so spironolactone was switched to chlorthalidone 1m daily. Most recently, pt was started on hydralazine 128mTID. BP home cuff is 158ears old and previously measured 10 mmHg higher than the clinic cuff. Patient presents today for follow up.  Patient presents today in good spirits. He reports tolerating his medications well and denies dizziness or balance problems. He has occasionally missed his midday dose of hydralazine. He notes some LEE that worsens throughout the day. 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 He turns 78 next week - his children are throwing him a party.  He has monitored his BP at home, ranging 128/57 to 182/81, most readings 130-150/70s. HR 55-72. He took his AM medications today at 6:30am. Continues to work out at [employer] in the morning and is trying to watch his sodium.  Current HTN meds: amlodipine 1014maily (PM), losartan 100m64mily (AM), chlorthalidone 25 mg daily (AM), hydralazine 10mg62m (6am, 12pm, 5pm)  Previously tried: spironolactone - hyperkalemia  BP goal: <130/80mmH38mamily History: Father with history of stroke, sister with history of diabetes.  Social History: Former smoker, quit in 1974, drinks red wine 3-4 days a week, denies illicit drug use.  Diet:  Breakfast - eggs, sausage or oatmeal Lunch - snacks on peanut butter crackers, rice cakes, peanuts Dinner - K and W Does not add salt to food, uses Mrs Dash 1Deliah BostontYam[place]p of decaf  coffee and occasionally tea  Exercise: Goes to the gym 3-4 days a week for at least an hour each time and uses the machines, weights, walks, and stair stepper machine.  Home BP readings: Lowest BP 127/77 (06/19/18). Highest BP 192/84 (12/131/19). Average BP 160-170s/70-80s, HR 60s.  Wt Readings from Last 3 Encounters:  05/19/18 176 lb 3.2 oz (79.9 kg)  05/06/18 178 lb (80.7 kg)  04/22/18 176 lb (79.8 kg)   BP Readings from Last 3 Encounters:  06/19/18 (!) 144/62  05/30/18 (!) 162/64  05/19/18 (!) 146/64   Pulse Readings from Last 3 Encounters:  06/19/18 (!) 57  05/30/18 (!) 57  05/19/18 65    Renal function: CrCl cannot be calculated (Patient's most recent lab result is older than the maximum 21 days allowed.).  Past Medical History:  Diagnosis Date  . Allergy   . Anemia   . Asthma   . BPH (benign prostatic hyperplasia)   . CAD (coronary artery disease)    a. s/p MI 09/1993;  b. known CTO of RCA;  c. 06/2014 Cath: LM short/mod dzs, LAD 95ost, LCX nl, RI nl, RCA 100, EF 55%;  c. 06/2014 s/p CABG x 4: LIMA->LAD, VG->D1, VG->OM1, VG->Acute Marginal.  . Carotid stenosis    a. Carotid [place] 5/78A 40-59%46-96% 60-79%29-52% 1  year  . CKD (chronic kidney disease), stage III ([place])   . Constipation    in the past 2-3 weeks  . Diabetes ([place])    AODM  . Diastolic dysfunction    a. 05/2015 Echo: EF 60-65%, LVH, Gr 1 DD, mild MR, mod dil RA/LA, mod TR, PASP 47mHg.  . Essential hypertension   . GERD (gastroesophageal reflux disease)   . Hyperlipidemia     Current Outpatient Medications on File Prior to Visit  Medication Sig Dispense Refill  . acarbose (PRECOSE) 25 MG tablet Take 1 tablet (25 mg total) by mouth 3 (three) times daily with meals. 90 tablet 5  . ACCU-CHEK GUIDE test strip CHECK BLOOD SUGAR 2 TIMES DAILY 200 each 2  . alfuzosin (UROXATRAL) 10 MG 24 hr tablet Take 10 mg by mouth daily with breakfast.    . amLODipine (NORVASC) 10 MG tablet Take 1 tablet (10 mg total)  by mouth daily. 90 tablet 3  . aspirin 81 MG tablet Take 81 mg by mouth daily.    . chlorthalidone (HYGROTON) 25 MG tablet Take 1 tablet (25 mg total) by mouth daily. 30 tablet 11  . DROPLET PEN NEEDLES 31G X 5 MM MISC USE AS DIRECTED TWICE DAILY 200 each 2  . glucose blood (ACCU-CHEK AVIVA PLUS) test strip Check blood glucose twice daily Dx code E11.65 100 each 2  . glucose blood (ACCU-CHEK GUIDE) test strip Use to check blood sugars 2 times daily 50 each 2  . hydrALAZINE (APRESOLINE) 10 MG tablet Take 1 tablet (10 mg total) by mouth 3 (three) times daily. 270 tablet 3  . insulin aspart protamine - aspart (NOVOLOG 70/30 MIX) (70-30) 100 UNIT/ML FlexPen Inject into the skin. INJECT 10 UNITS BEFORE BREAKFAST AND 5 UNITS BEFORE SUPPER (PEN EXPIRES 14 DAYS AFTER OPENING)    . Lancets Misc. (ACCU-CHEK SOFTCLIX LANCET DEV) KIT     . losartan (COZAAR) 100 MG tablet Take 1 tablet (100 mg total) by mouth daily. 90 tablet 3  . metFORMIN (GLUCOPHAGE) 1000 MG tablet Take 1 tablet (1,000 mg total) by mouth 2 (two) times daily with a meal. 180 tablet 3  . Multiple Vitamins-Minerals (CENTRUM SILVER ADULT 50+ PO) Take by mouth.    . pioglitazone (ACTOS) 15 MG tablet Take 1 tablet (15 mg total) by mouth daily. 90 tablet 3  . simvastatin (ZOCOR) 20 MG tablet Take 1 tablet (20 mg total) by mouth every evening. 90 tablet 3  . TRUEPLUS LANCETS 28G MISC Use to test blood sugar 2 times daily 210 each 4   No current facility-administered medications on file prior to visit.     Allergies  Allergen Reactions  . Lasix [Furosemide] Other (See Comments)    Constipation     Assessment/Plan:  1. Hypertension - BP remains above goal <130/80 today in the office and with ~50% of home readings. Will increase hydralazine to 259mTID and continue amlodipine 1061maily (PM), losartan 100m19mily (AM), and chlorthalidone 25 mg daily (AM). He will double up on his hydralazine 10mg71mlets until he runs out and new mail order  rx arrives. Will also start furosemide 20mg 38mswelling in his ankles. Pt aware to stay well hydrated with water. Discussed constipation allergy listed with furosemide - pt states this is manageable. Advised patient to continue checking BP at home. Will f/u with pt in clinic in 4 weeks.   E. Supple, PharmD, BCACP, CPP [id number]  [address] Phone: [phone]; Fax: [phone] 07/28/2018 7:35 AM

## 2018-07-28 NOTE — Patient Instructions (Addendum)
It was nice to see you today  Start taking furosemide (Lasix) 20mg  as needed for swelling  Increase your hydralazine - you can double up and take 2 of your 10mg  tablets 3x a day until you run out, then start new prescription of the 25mg  tablets - take 1 tablet 3x a day  Continue taking all of your other medications  Stay hydrated with water  Follow up in clinic in 4 weeks for a blood pressure check and lab work

## 2018-07-29 ENCOUNTER — Other Ambulatory Visit: Payer: Self-pay | Admitting: Cardiovascular Disease

## 2018-07-29 MED ORDER — FUROSEMIDE 20 MG PO TABS: 20.0000 mg | ORAL_TABLET | Freq: Every day | ORAL | 2 refills | Status: DC | PRN

## 2018-07-31 ENCOUNTER — Other Ambulatory Visit: Payer: Self-pay | Admitting: Endocrinology

## 2018-08-16 ENCOUNTER — Other Ambulatory Visit: Payer: Self-pay | Admitting: Endocrinology

## 2018-08-16 DIAGNOSIS — E119 Type 2 diabetes mellitus without complications: Secondary | ICD-10-CM

## 2018-08-18 ENCOUNTER — Other Ambulatory Visit (INDEPENDENT_AMBULATORY_CARE_PROVIDER_SITE_OTHER): Payer: Medicare HMO

## 2018-08-18 ENCOUNTER — Other Ambulatory Visit: Payer: Self-pay

## 2018-08-18 DIAGNOSIS — E119 Type 2 diabetes mellitus without complications: Secondary | ICD-10-CM | POA: Diagnosis not present

## 2018-08-18 LAB — COMPREHENSIVE METABOLIC PANEL
ALT: 13 U/L (ref 0–53)
AST: 19 U/L (ref 0–37)
Albumin: 4.1 g/dL (ref 3.5–5.2)
Alkaline Phosphatase: 61 U/L (ref 39–117)
BUN: 42 mg/dL — ABNORMAL HIGH (ref 6–23)
CO2: 29 mEq/L (ref 19–32)
Calcium: 9.6 mg/dL (ref 8.4–10.5)
Chloride: 100 mEq/L (ref 96–112)
Creatinine, Ser: 2.02 mg/dL — ABNORMAL HIGH (ref 0.40–1.50)
GFR: 31.94 mL/min — ABNORMAL LOW (ref >60.00)
Glucose, Bld: 154 mg/dL — ABNORMAL HIGH (ref 70–99)
Potassium: 4.7 mEq/L (ref 3.5–5.1)
Sodium: 138 mEq/L (ref 135–145)
Total Bilirubin: 0.6 mg/dL (ref 0.2–1.2)
Total Protein: 7.2 g/dL (ref 6.0–8.3)

## 2018-08-18 LAB — HEMOGLOBIN A1C: Hgb A1c MFr Bld: 7.5 % — ABNORMAL HIGH (ref 4.6–6.5)

## 2018-08-19 ENCOUNTER — Other Ambulatory Visit: Payer: Self-pay | Admitting: Endocrinology

## 2018-08-21 ENCOUNTER — Encounter: Payer: Self-pay | Admitting: Endocrinology

## 2018-08-21 ENCOUNTER — Other Ambulatory Visit: Payer: Self-pay

## 2018-08-21 ENCOUNTER — Ambulatory Visit: Payer: Medicare HMO | Admitting: Endocrinology

## 2018-08-21 VITALS — BP 122/54 | HR 57 | Temp 97.6°F | Ht 70.0 in | Wt 172.4 lb

## 2018-08-21 DIAGNOSIS — Z794 Long term (current) use of insulin: Secondary | ICD-10-CM

## 2018-08-21 DIAGNOSIS — E1165 Type 2 diabetes mellitus with hyperglycemia: Secondary | ICD-10-CM

## 2018-08-21 NOTE — Patient Instructions (Addendum)
Check blood sugars on waking up 4 days a week  Also check blood sugars about 2 hours after meals and do this after different meals by rotation  Recommended blood sugar levels on waking up are 90-130 and about 2 hours after meal is 130-160  Please bring your blood sugar monitor to each visit, thank you  Take 12 units at supper  Metformin: stop now  Stop Acarbose at Breakfast and lunch  Eat balanced lunch  Start walking daily  Cut Losartan in 1/2

## 2018-08-21 NOTE — Progress Notes (Signed)
Patient ID: Justin Salinas, male   DOB: Nov 04, 1940, 78 y.o.   MRN: [id number]           Reason for Appointment: Follow-up for Type 2 Diabetes    History of Present Illness:          Date of diagnosis of type 2 diabetes mellitus:2000        Background history:   His diabetes had been mild initially and treated with metformin He had subsequently other medications added including Amaryl and Januvia No details are available from PCP office, [name] in 2015 was 7.6  Because of his poor control was started on insulin on 01/03/17. He has been on Actos since 06/2017 He had a baseline A1c A1c 9.7 on his initial consultation in 01/2017  Recent history:   Insulin regimen: NovoLog mix, 10 units at breakfast and 5-7 at supper  Non-insulin hypoglycemic drugs the patient is taking are: Metformin 0.5 g a.m. and 1 g p.m. Actos 15 mg daily, acarbose 3 times daily  A1c now is 7.5, previously 6.5   Current management, blood sugar patterns and problems identified:  His insulin dose has not been changed recently  He has been doing poorly with his diet and he thinks that he is eating too much in the evenings and also snacking during the day with variable intake  He has seen the dietitian previously  Also because of various reasons including feeling tired he has not gone to the gym except once but has not been doing any walking on his own also  He has been consistent with taking his insulin before meals  He continues to take acarbose which had been previously prescribed by his PCP  He has not checked his readings after meals as instructed and only before breakfast and suppertime  With his variable intake during the day his blood sugars in the late afternoon are sometimes near normal but a couple of times over 200 recently  Morning sugars are not as variable        Side effects from medications have been: None  Compliance with the medical regimen: Good  Glucose monitoring:  done 2 times a  day         Glucometer:  Accu-Chek guide me .      Blood Glucose readings by review of monitor:  FASTING range recently 101-159 Dinnertime range 130-291  14-day average 150 and 30-day average 146, previous average 138   Self-care: The diet that the patient has been following is: tries to limit sweets, fried food       Typical meal intake: Breakfast is eggs meat, toast.  Lunch: Sandwich or.Crackers with or without peanut butter usually                Dietician visit, most recent: 03/2017               Exercise:   walking or other exercise at the gym 0/7 days for at least 40 min, usually morning   Weight history:   Wt Readings from Last 3 Encounters:  08/21/18 172 lb 6.4 oz (78.2 kg)  05/19/18 176 lb 3.2 oz (79.9 kg)  05/06/18 178 lb (80.7 kg)    Glycemic control:   Lab Results  Component Value Date   HGBA1C 7.5 (H) 08/18/2018   HGBA1C 6.5 (A) 05/19/2018   HGBA1C 6.2 (A) 02/17/2018   Lab Results  Component Value Date   MICROALBUR <0.7 02/17/2018   LDLCALC 42 02/17/2018  CREATININE 2.02 (H) 08/18/2018   Lab Results  Component Value Date   MICRALBCREAT 1.3 02/17/2018   Microalbumin ratio 9 in 12/2015  Lab Results  Component Value Date   FRUCTOSAMINE 351 (H) 02/14/2017      Allergies as of 08/21/2018      Reactions   Lasix [furosemide] Other (See Comments)   Constipation      Medication List       Accurate as of August 21, 2018  8:43 AM. Always use your most recent med list.        acarbose 25 MG tablet Commonly known as:  PRECOSE Take 1 tablet (25 mg total) by mouth 3 (three) times daily with meals.   Accu-Chek Softclix Lancet Dev Kit   alfuzosin 10 MG 24 hr tablet Commonly known as:  UROXATRAL Take 10 mg by mouth daily with breakfast.   amLODipine 10 MG tablet Commonly known as:  NORVASC Take 1 tablet (10 mg total) by mouth daily.   aspirin 81 MG tablet Take 81 mg by mouth daily.   CENTRUM SILVER ADULT 50+ PO Take by mouth.     chlorthalidone 25 MG tablet Commonly known as:  HYGROTON Take 1 tablet (25 mg total) by mouth daily.   Droplet Pen Needles 31G X 5 MM Misc Generic drug:  Insulin Pen Needle USE AS DIRECTED TWICE DAILY   furosemide 20 MG tablet Commonly known as:  LASIX Take 1 tablet (20 mg total) by mouth daily as needed (for swelling).   glucose blood test strip Commonly known as:  Accu-Chek Aviva Plus Check blood glucose twice daily Dx code E11.65   glucose blood test strip Commonly known as:  Accu-Chek Guide Use to check blood sugars 2 times daily   Accu-Chek Guide test strip Generic drug:  glucose blood CHECK BLOOD SUGAR 2 TIMES DAILY   hydrALAZINE 25 MG tablet Commonly known as:  APRESOLINE Take 1 tablet (25 mg total) by mouth 3 (three) times daily.   Insulin Asp Prot & Asp FlexPen (70-30) 100 UNIT/ML Supn INJECT 10 UNITS SUBCUTANEOUSLY  BEFORE BREAKFAST AND 6 UNITS BEFORE SUPPER (PEN EXPIRES 14 DAYS AFTER OPENING)   losartan 100 MG tablet Commonly known as:  COZAAR Take 1 tablet (100 mg total) by mouth daily.   metFORMIN 1000 MG tablet Commonly known as:  GLUCOPHAGE TAKE 1 TABLET BY MOUTH IN THE MORNING, AND 1/2 TABLET IN THE EVENING.   pioglitazone 15 MG tablet Commonly known as:  ACTOS Take 1 tablet (15 mg total) by mouth daily.   simvastatin 20 MG tablet Commonly known as:  ZOCOR Take 1 tablet (20 mg total) by mouth every evening.   TRUEplus Lancets 28G Misc Use to test blood sugar 2 times daily       Allergies:  Allergies  Allergen Reactions   Lasix [Furosemide] Other (See Comments)    Constipation    Past Medical History:  Diagnosis Date   Allergy    Anemia    Asthma    BPH (benign prostatic hyperplasia)    CAD (coronary artery disease)    a. s/p MI 09/1993;  b. known CTO of RCA;  c. 06/2014 Cath: LM short/mod dzs, LAD 95ost, LCX nl, RI nl, RCA 100, EF 55%;  c. 06/2014 s/p CABG x 4: LIMA->LAD, VG->D1, VG->OM1, VG->Acute Marginal.   Carotid stenosis     a. Carotid US 3/15:  RICA 17-61%; LICA 60-73% >> FU 1 year   CKD (chronic kidney disease), stage III ([place])  Constipation    in the past 2-3 weeks   Diabetes ([place])    AODM   Diastolic dysfunction    a. 05/2015 Echo: EF 60-65%, LVH, Gr 1 DD, mild MR, mod dil RA/LA, mod TR, PASP 104mHg.   Essential hypertension    GERD (gastroesophageal reflux disease)    Hyperlipidemia     Past Surgical History:  Procedure Laterality Date   ANGIOPLASTY  09/22/1993   POBA of RCA (Dr. RMarella Chimes   [place] fusion - Dr. HEarnie Larsson  CARDIAC CATHETERIZATION  11/28/1998   "silent occlusion" of RCA w/collaterals from left coronary system, prox LAD w/40-50% eccentric narrowing, 1st diagonal with 70-80% eccentric narrowing, 85% narrowing of prox small OM1 (Dr. RMarella Chimes   CAROTID DOPPLER  07/2011   left subclavian (50-69%); right bulb (0-49%); RICA (normal); left mid-distal CCA (0-49%); left bulb/prox ICA (50-69%); left vertebral with abnormal antegrade flow   CORONARY ARTERY BYPASS GRAFT N/A 05/21/2014   Procedure: CORONARY ARTERY BYPASS GRAFTING (CABG) x  four, using left internal mammary artery and right leg greater saphenous vein harvested endoscopically;  Surgeon: SMelrose Nakayama MD;  [facility]  Service: Open Heart Surgery;  Laterality: N/A;   INTRAOPERATIVE TRANSESOPHAGEAL ECHOCARDIOGRAM N/A 05/21/2014   Procedure: INTRAOPERATIVE TRANSESOPHAGEAL ECHOCARDIOGRAM;  Surgeon: SMelrose Nakayama MD;  [facility]  Service: Open Heart Surgery;  Laterality: N/A;   LEFT HEART CATHETERIZATION WITH CORONARY ANGIOGRAM N/A 05/19/2014   Procedure: LEFT HEART CATHETERIZATION WITH CORONARY ANGIOGRAM;  Surgeon: MBlane Ohara MD;  [facility];  Service: Cardiovascular;  Laterality: N/A;   NASAL SINUS SURGERY  2010   NM MYOCAR PERF WALL MOTION  09/19/2009   bruce myoview - mild perfusion defect in basal inferior region (infarct/scar), EF 60%, low risk  scan   RENAL DOPPLER  10/2011   SMA w/ 70-99% diameter reduction & high grade stenosis; R&L renals w/narrowing and increased velocities (60-99%), R kidney smaller than L   TRANSTHORACIC ECHOCARDIOGRAM  10/20/2012   EF 55-60%, moderate concentric hypertrophy, ventricular septum thickness increased, calcified MV annulus   VASECTOMY  1963    Family History  Problem Relation Age of Onset   Uterine cancer Mother    Stroke Father    Diabetes Sister    Colon polyps Brother    Heart disease Brother    Colon cancer Neg Hx    Esophageal cancer Neg Hx    Rectal cancer Neg Hx    Stomach cancer Neg Hx     Social History:  reports that he quit smoking about 46 years ago. His smoking use included cigarettes. His smokeless tobacco use includes snuff. He reports current alcohol use of about 3.0 standard drinks of alcohol per week. He reports that he does not use drugs.   Review of Systems   Lipid history: Taking simvastatin 40 mg, most recent LDL:     Lab Results  Component Value Date   CHOL 107 02/17/2018   HDL 52.40 02/17/2018   LDLCALC 42 02/17/2018   LDLDIRECT 45.0 04/17/2017   TRIG 59.0 02/17/2018   CHOLHDL 2 02/17/2018           Hypertension: Blood pressure is better with adding hydralazine and he is also on Lasix and chlorthalidone now  Treated by cardiology in the hypertension clinic No recent lightheadedness  BP Readings from Last 3 Encounters:  08/21/18 (!) 122/54  07/28/18 (!) 148/54  06/19/18 (!) 144/62   Renal function  has been variable and recently worse Also on Lasix for edema now  Lab Results  Component Value Date   CREATININE 2.02 (H) 08/18/2018   CREATININE 1.63 (H) 06/19/2018   CREATININE 1.53 (H) 05/30/2018   EDEMA: He has had a little more swelling of his legs and he is on Lasix recently  ANEMIA: He has been told to take iron, anemia was improved and November  Most recent eye exam was 10/19  Most recent foot exam: 9/19   LABS:  Lab  on 08/18/2018  Component Date Value Ref Range Status   Sodium 08/18/2018 138  135 - 145 mEq/L Final   Potassium 08/18/2018 4.7  3.5 - 5.1 mEq/L Final   Chloride 08/18/2018 100  96 - 112 mEq/L Final   CO2 08/18/2018 29  19 - 32 mEq/L Final   Glucose, Bld 08/18/2018 154* 70 - 99 mg/dL Final   BUN 08/18/2018 42* 6 - 23 mg/dL Final   Creatinine, Ser 08/18/2018 2.02* 0.40 - 1.50 mg/dL Final   Total Bilirubin 08/18/2018 0.6  0.2 - 1.2 mg/dL Final   Alkaline Phosphatase 08/18/2018 61  39 - 117 U/L Final   AST 08/18/2018 19  0 - 37 U/L Final   ALT 08/18/2018 13  0 - 53 U/L Final   Total Protein 08/18/2018 7.2  6.0 - 8.3 g/dL Final   Albumin 08/18/2018 4.1  3.5 - 5.2 g/dL Final   Calcium 08/18/2018 9.6  8.4 - 10.5 mg/dL Final   GFR 08/18/2018 31.94* >60.00 mL/min Final   Hgb A1c MFr Bld 08/18/2018 7.5* 4.6 - 6.5 % Final   Glycemic Control Guidelines for People with Diabetes:Non Diabetic:  <6%Goal of Therapy: <7%Additional Action Suggested:  >8%     Physical Examination:  BP (!) 122/54 (BP Location: Left Arm, Patient Position: Sitting, Cuff Size: Normal)    Pulse (!) 57    Temp 97.6 F (36.4 C) (Oral)    Ht _0  (1.778 m)    Wt 172 lb 6.4 oz (78.2 kg)    SpO2 97%    BMI 24.74 kg/m   No significant pedal edema present Feet are normal to inspection on the plantar surfaces   ASSESSMENT:  Diabetes type 2, insulin requiring  See history of present illness for detailed discussion of current diabetes management, blood sugar patterns and problems identified  [name] has gone up to 7.5 from 6.5  He is on small doses of premixed insulin along with 3 other non-insulin oral medications  Recently his blood sugars are worse from poor diet, lack of exercise Also not clear if he is consistently having high readings after his evening meal which is his largest meal He has not been motivated to do well with his diet or exercise regimen lately  Leg edema: He is doing better with  elastic stockings and Lasix  HYPERTENSION and renal dysfunction: Likely worse from lowering blood pressure and adding more diuretics and is due to follow up in 1 week However has not been recommended nephrology consultation which may be desirable Currently does not have any history of microalbuminuria from diabetes   PLAN:     He thinks he can start walking as tolerated up to 30-40 minutes outside  He will need to start checking readings after meals consistently  Advised him to have balanced meals at lunch and not excessive snacks in the afternoon  He can also cut back on portions and carbohydrates at dinnertime  Consider follow-up with dietitian also  Because  of worsening renal function will hold his metformin  Also stop acarbose except at dinnertime  Increase morning insulin to 12 units  Reduce losartan to half a tablet until seen by cardiology hypertension clinic  Patient Instructions  Check blood sugars on waking up 4 days a week  Also check blood sugars about 2 hours after meals and do this after different meals by rotation  Recommended blood sugar levels on waking up are 90-130 and about 2 hours after meal is 130-160  Please bring your blood sugar monitor to each visit, thank you  Take 12 units at supper  Metformin: stop now  Stop Acarbose at Breakfast and lunch  Eat balanced lunch  Start walking daily  Cut Losartan in 1/2      Total visit time for evaluation and management of multiple problems and counseling =25 minutes  Elayne Snare 08/21/2018, 8:43 AM   Note: This office note was prepared with Dragon voice recognition system technology. Any transcriptional errors that result from this process are unintentional.

## 2018-08-23 ENCOUNTER — Other Ambulatory Visit: Payer: Self-pay | Admitting: Endocrinology

## 2018-08-25 ENCOUNTER — Telehealth: Payer: Self-pay | Admitting: Family Medicine

## 2018-08-25 NOTE — Telephone Encounter (Signed)
[place] for nephrology referral.  [name]. [name], MD 08/25/2018 4:36 PM

## 2018-08-25 NOTE — Telephone Encounter (Signed)
Please advise 

## 2018-08-25 NOTE — Telephone Encounter (Signed)
See note  Copied from [place] [id number]. Topic: Referral - Request for Referral >> Aug 25, 2018  1:33 PM Scherrie Gerlach wrote: Has patient seen PCP for this complaint? no Pt states Dr Stevie Kern advised him he needs to see a kidney specialists, and told him to call his pcp. Pt states he was told his kidneys are not functioning correctly.advise Please advise.

## 2018-08-26 ENCOUNTER — Other Ambulatory Visit: Payer: Self-pay

## 2018-08-26 ENCOUNTER — Ambulatory Visit: Payer: Medicare HMO

## 2018-08-26 DIAGNOSIS — N183 Chronic kidney disease, stage 3 unspecified: Secondary | ICD-10-CM

## 2018-08-26 NOTE — Telephone Encounter (Signed)
Referral has been placed. 

## 2018-09-05 ENCOUNTER — Other Ambulatory Visit: Payer: Self-pay

## 2018-09-05 ENCOUNTER — Telehealth: Payer: Self-pay | Admitting: Endocrinology

## 2018-09-05 DIAGNOSIS — N183 Chronic kidney disease, stage 3 (moderate): Secondary | ICD-10-CM | POA: Diagnosis not present

## 2018-09-05 DIAGNOSIS — E1129 Type 2 diabetes mellitus with other diabetic kidney complication: Secondary | ICD-10-CM | POA: Diagnosis not present

## 2018-09-05 DIAGNOSIS — I129 Hypertensive chronic kidney disease with stage 1 through stage 4 chronic kidney disease, or unspecified chronic kidney disease: Secondary | ICD-10-CM | POA: Diagnosis not present

## 2018-09-05 DIAGNOSIS — N400 Enlarged prostate without lower urinary tract symptoms: Secondary | ICD-10-CM | POA: Diagnosis not present

## 2018-09-05 DIAGNOSIS — D631 Anemia in chronic kidney disease: Secondary | ICD-10-CM | POA: Diagnosis not present

## 2018-09-05 DIAGNOSIS — E1122 Type 2 diabetes mellitus with diabetic chronic kidney disease: Secondary | ICD-10-CM | POA: Diagnosis not present

## 2018-09-05 MED ORDER — GLUCOSE BLOOD VI STRP: ORAL_STRIP | 1 refills | Status: DC

## 2018-09-05 NOTE — Telephone Encounter (Signed)
rx sent

## 2018-09-05 NOTE — Telephone Encounter (Signed)
MEDICATION: ACCU-CHEK GUIDE test strip  PHARMACY:  [facility]  IS THIS A 90 DAY SUPPLY : Yes  IS PATIENT OUT OF MEDICATION: Yes  IF NOT; HOW MUCH IS LEFT:   LAST APPOINTMENT DATE: @3 /21/2020  NEXT APPOINTMENT DATE:@5 /05/2019  DO WE HAVE YOUR PERMISSION TO LEAVE A DETAILED MESSAGE:  OTHER COMMENTS:  Patients care giver states we denied the refill request.  **Let patient know to contact pharmacy at the end of the day to make sure medication is ready. **  ** Please notify patient to allow 48-72 hours to process**  **Encourage patient to contact the pharmacy for refills or they can request refills through [facility]**

## 2018-09-08 ENCOUNTER — Other Ambulatory Visit: Payer: Self-pay | Admitting: Nephrology

## 2018-09-08 DIAGNOSIS — N183 Chronic kidney disease, stage 3 unspecified: Secondary | ICD-10-CM

## 2018-09-10 ENCOUNTER — Telehealth: Payer: Self-pay | Admitting: Pharmacist

## 2018-09-10 NOTE — Telephone Encounter (Addendum)
Patient ID: Justin Salinas                 DOB: 04/07/1941                      MRN: [id number]     HPI: Justin Salinas is a pleasant 78 y.o. male referred by Dr. Cooper to HTN clinic. PMH is significant for CAD s/p remote PCI in the 1990s and multivessel CABG in 2015, HTN, HLD, DM2, and CKD. Pt has been following in HTN clinic with improvement in BP from 184/68 to 142/68 with dose titrations of amlodipine and losartan. When spironolactone 12.5 mg was initiated, K increased to 5.2, so spironolactone was switched to chlorthalidone 25mg daily. Most recently, patients hydralazine was increased to 25mg three times a day. His losartan was decreased to 50mg daily by his endocrinologist due to decline in kidney function and low BP (124/54) in office. Patient was seen at nephrologist last week and BP was 142/64.  Todays visit was done over the phone due to COVID 19 pandemic.  His blood pressure cuff is about 80 years old and has previously been shown to run about 10 points higher than clinic readings. He has not taken his furosemide in a few days because he hasn't had swelling. I advised at that is perfectly accepted to only take as needed for swelling. (this should also help his declining renal function)  Current HTN meds: amlodipine 10mg daily (PM), losartan 50mg daily (AM), chlorthalidone 25 mg daily (AM), hydralazine 25mg TID (6am, 12pm, 5pm)  Previously tried: spironolactone - hyperkalemia  BP goal: <130/80mmHg  Family History: Father with history of stroke, sister with history of diabetes.  Social History: Former smoker, quit in 1974, drinks red wine 3-4 days a week, denies illicit drug use.  Diet:  Breakfast - eggs, sausage or oatmeal Lunch - snacks on peanut butter crackers, rice cakes, peanuts Dinner - K and W Does not add salt to food, uses Mrs Dash 1 diet Mountain Dew, 1 cup of decaf coffee and occasionally tea  Exercise: Goes to the gym 3-4 days a week for at least an hour  each time and uses the machines, weights, walks, and stair stepper machine.  Home BP readings: 153/76, 143/72, 133/73, 137/75,130/68  142/64  (nephrologist)  Wt Readings from Last 3 Encounters:  08/21/18 172 lb 6.4 oz (78.2 kg)  05/19/18 176 lb 3.2 oz (79.9 kg)  05/06/18 178 lb (80.7 kg)   BP Readings from Last 3 Encounters:  08/21/18 (!) 122/54  07/28/18 (!) 148/54  06/19/18 (!) 144/62   Pulse Readings from Last 3 Encounters:  08/21/18 (!) 57  07/28/18 62  06/19/18 (!) 57    Renal function: CrCl cannot be calculated (Patient's most recent lab result is older than the maximum 21 days allowed.).  Past Medical History:  Diagnosis Date  . Allergy   . Anemia   . Asthma   . BPH (benign prostatic hyperplasia)   . CAD (coronary artery disease)    a. s/p MI 09/1993;  b. known CTO of RCA;  c. 06/2014 Cath: LM short/mod dzs, LAD 95ost, LCX nl, RI nl, RCA 100, EF 55%;  c. 06/2014 s/p CABG x 4: LIMA->LAD, VG->D1, VG->OM1, VG->Acute Marginal.  . Carotid stenosis    a. Carotid US 1/17:  RICA 40-59%; LICA 60-79% >> FU 1 year  . CKD (chronic kidney disease), stage III ([facility])   . Constipation    in the past   2-3 weeks  . Diabetes ([place])    AODM  . Diastolic dysfunction    a. 05/2015 Echo: EF 60-65%, LVH, Gr 1 DD, mild MR, mod dil RA/LA, mod TR, PASP 49mHg.  . Essential hypertension   . GERD (gastroesophageal reflux disease)   . Hyperlipidemia     Current Outpatient Medications on File Prior to Visit  Medication Sig Dispense Refill  . acarbose (PRECOSE) 25 MG tablet Take 1 tablet (25 mg total) by mouth 3 (three) times daily with meals. 90 tablet 5  . ACCU-CHEK GUIDE test strip CHECK BLOOD SUGAR 2 TIMES DAILY 200 each 2  . alfuzosin (UROXATRAL) 10 MG 24 hr tablet Take 10 mg by mouth daily with breakfast.    . amLODipine (NORVASC) 10 MG tablet Take 1 tablet (10 mg total) by mouth daily. 90 tablet 3  . aspirin 81 MG tablet Take 81 mg by mouth daily.    . chlorthalidone (HYGROTON) 25 MG  tablet Take 1 tablet (25 mg total) by mouth daily. 30 tablet 11  . DROPLET PEN NEEDLES 31G X 5 MM MISC USE AS DIRECTED TWICE DAILY 200 each 2  . furosemide (LASIX) 20 MG tablet Take 1 tablet (20 mg total) by mouth daily as needed (for swelling). 90 tablet 2  . glucose blood (ACCU-CHEK AVIVA PLUS) test strip Check blood glucose twice daily Dx code E11.65 100 each 2  . glucose blood (ACCU-CHEK GUIDE) test strip Use to check blood sugars 2 times daily 200 each 1  . hydrALAZINE (APRESOLINE) 25 MG tablet Take 1 tablet (25 mg total) by mouth 3 (three) times daily. 270 tablet 3  . Insulin Aspart Prot & Aspart (INSULIN ASP PROT & ASP FLEXPEN) (70-30) 100 UNIT/ML SUPN INJECT 10 UNITS SUBCUTANEOUSLY  BEFORE BREAKFAST AND 6 UNITS BEFORE SUPPER (PEN EXPIRES 14 DAYS AFTER OPENING) 30 mL 3  . Lancets Misc. (ACCU-CHEK SOFTCLIX LANCET DEV) KIT     . losartan (COZAAR) 100 MG tablet Take 1 tablet (100 mg total) by mouth daily. 90 tablet 3  . metFORMIN (GLUCOPHAGE) 1000 MG tablet TAKE 1 TABLET BY MOUTH IN THE MORNING, AND 1/2 TABLET IN THE EVENING. 180 tablet 1  . Multiple Vitamins-Minerals (CENTRUM SILVER ADULT 50+ PO) Take by mouth.    . pioglitazone (ACTOS) 15 MG tablet Take 1 tablet (15 mg total) by mouth daily. 90 tablet 3  . simvastatin (ZOCOR) 20 MG tablet Take 1 tablet (20 mg total) by mouth every evening. 90 tablet 3  . TRUEplus Lancets 28G MISC USE TO TEST BLOOD SUGAR 2 TIMES DAILY 200 each 1   No current facility-administered medications on file prior to visit.     Allergies  Allergen Reactions  . Lasix [Furosemide] Other (See Comments)    Constipation     Assessment/Plan:  1. Hypertension - Patients blood pressure not consistently at goal, however, considering his meter potentially runs 10 points higher his blood pressure appears to be at goal the majority of the time (based off of the few readings provided). I will not make any changes today in medications. Will follow up in clinic when able so  we can again compare home meter to clinic reading. Patient is having a renal ultrasound at the end of May. I also do not want to cause hypotension and worsen his renal function. Therefore I am choosing not to change his medications without further evaluation.  MRamond Dial Pharm.D, BEastover [id number] [place] [address]  Phone: [phone]; Fax: [phone]   09/10/2018 2:37 PM   

## 2018-09-17 ENCOUNTER — Other Ambulatory Visit: Payer: Self-pay

## 2018-09-17 ENCOUNTER — Ambulatory Visit
Admission: RE | Admit: 2018-09-17 | Discharge: 2018-09-17 | Disposition: A | Discharge status: Home / Self Care | Payer: Medicare HMO | Source: Ambulatory Visit | Attending: Nephrology | Admitting: Nephrology

## 2018-09-17 DIAGNOSIS — N183 Chronic kidney disease, stage 3 unspecified: Secondary | ICD-10-CM

## 2018-10-10 ENCOUNTER — Other Ambulatory Visit: Payer: Self-pay | Admitting: Family Medicine

## 2018-10-13 NOTE — Telephone Encounter (Signed)
Last OV 04/22/18 Last refill 05/05/18 #90/5 Next OV 10/20/18

## 2018-10-14 ENCOUNTER — Other Ambulatory Visit: Payer: Self-pay

## 2018-10-14 ENCOUNTER — Other Ambulatory Visit (INDEPENDENT_AMBULATORY_CARE_PROVIDER_SITE_OTHER): Payer: Medicare HMO

## 2018-10-14 DIAGNOSIS — Z794 Long term (current) use of insulin: Secondary | ICD-10-CM | POA: Diagnosis not present

## 2018-10-14 DIAGNOSIS — E1165 Type 2 diabetes mellitus with hyperglycemia: Secondary | ICD-10-CM

## 2018-10-14 LAB — BASIC METABOLIC PANEL
BUN: 42 mg/dL — ABNORMAL HIGH (ref 6–23)
CO2: 27 mEq/L (ref 19–32)
Calcium: 8.9 mg/dL (ref 8.4–10.5)
Chloride: 97 mEq/L (ref 96–112)
Creatinine, Ser: 2.17 mg/dL — ABNORMAL HIGH (ref 0.40–1.50)
GFR: 29.39 mL/min — ABNORMAL LOW (ref >60.00)
Glucose, Bld: 207 mg/dL — ABNORMAL HIGH (ref 70–99)
Potassium: 4 mEq/L (ref 3.5–5.1)
Sodium: 134 mEq/L — ABNORMAL LOW (ref 135–145)

## 2018-10-16 LAB — FRUCTOSAMINE: Fructosamine: 334 umol/L — ABNORMAL HIGH (ref 0–285)

## 2018-10-20 ENCOUNTER — Ambulatory Visit: Payer: Medicare HMO | Admitting: Family Medicine

## 2018-10-21 NOTE — Progress Notes (Signed)
Patient ID: Justin Salinas, male   DOB: December 22, 1940, 78 y.o.   MRN: [id number]           Reason for Appointment: Follow-up for Type 2 Diabetes    History of Present Illness:          Date of diagnosis of type 2 diabetes mellitus:2000        Background history:   His diabetes had been mild initially and treated with metformin He had subsequently other medications added including Amaryl and Januvia No details are available from PCP office, [name] in 2015 was 7.6  Because of his poor control was started on insulin on 01/03/17. He has been on Actos since 06/2017+ He had a baseline A1c A1c 9.7 on his initial consultation in 01/2017  Recent history:   Insulin regimen: NovoLog mix, 10 units at breakfast and 12 at supper  Non-insulin hypoglycemic drugs the patient is taking are: Actos 15 mg daily, acarbose at dinnertime daily  A1c in March was 7.5, previously 6.5 Fructosamine is 334  Current management, blood sugar patterns and problems identified:  His insulin dose was supposed to be increased to 12 units in the morning on the last visit since he was having periodically high readings at dinnertime  However he misunderstood and he has increased his evening dose from 6 units to 12 units  Also his metformin was stopped on the last visit  Surprisingly his blood sugars compared to the last visit are relatively better  He still has variability in his blood sugars at breakfast and dinnertime  Despite reminders he does not check any POSTPRANDIAL readings  LOWEST blood sugar was 69 at 5 PM  HIGHEST blood sugar 312 at 5 PM  He does not report any symptoms of hypoglycemia  Also he was told to watch his sweets and carbohydrates in the last visit and his weight is down 2 pounds  Previously was going to the gym but now is trying to walk fairly regularly, probably more often than before        Side effects from medications have been: None  Compliance with the medical regimen: Good   Glucose monitoring:  done 2 times a day         Glucometer:  Accu-Chek guide me .      Blood Glucose readings by review of monitor:   PRE-MEAL Fasting Lunch Dinner Bedtime Overall  Glucose range:  79-151   XT 9-312    Mean/median:  112   154   131, was 150     Self-care: The diet that the patient has been following is: tries to limit sweets, fried food       Typical meal intake: Breakfast is eggs meat, toast.  Lunch: Sandwich or.Crackers with or without peanut butter usually                Dietician visit, most recent: 03/2017   Weight history:   Wt Readings from Last 3 Encounters:  10/22/18 170 lb (77.1 kg)  08/21/18 172 lb 6.4 oz (78.2 kg)  05/19/18 176 lb 3.2 oz (79.9 kg)    Glycemic control:   Lab Results  Component Value Date   HGBA1C 7.5 (H) 08/18/2018   HGBA1C 6.5 (A) 05/19/2018   HGBA1C 6.2 (A) 02/17/2018   Lab Results  Component Value Date   MICROALBUR <0.7 02/17/2018   LDLCALC 42 02/17/2018   CREATININE 2.17 (H) 10/14/2018   Lab Results  Component Value Date   MICRALBCREAT 1.3  02/17/2018   Microalbumin ratio 9 in 12/2015  Lab Results  Component Value Date   FRUCTOSAMINE 334 (H) 10/14/2018   FRUCTOSAMINE 351 (H) 02/14/2017      Allergies as of 10/22/2018      Reactions   Lasix [furosemide] Other (See Comments)   Constipation      Medication List       Accurate as of Oct 22, 2018 10:13 AM. If you have any questions, ask your nurse or doctor.        STOP taking these medications   metFORMIN 1000 MG tablet Commonly known as:  GLUCOPHAGE Stopped by:  Elayne Snare, MD     TAKE these medications   acarbose 25 MG tablet Commonly known as:  PRECOSE TAKE 1 TABLET THREE TIMES DAILY WITH MEALS.   Accu-Chek Softclix Lancet Dev Kit   alfuzosin 10 MG 24 hr tablet Commonly known as:  UROXATRAL Take 10 mg by mouth daily with breakfast.   amLODipine 10 MG tablet Commonly known as:  NORVASC Take 1 tablet (10 mg total) by mouth daily.    aspirin 81 MG tablet Take 81 mg by mouth daily.   CENTRUM SILVER ADULT 50+ PO Take by mouth.   chlorthalidone 25 MG tablet Commonly known as:  HYGROTON Take 1 tablet (25 mg total) by mouth daily.   Droplet Pen Needles 31G X 5 MM Misc Generic drug:  Insulin Pen Needle USE AS DIRECTED TWICE DAILY   furosemide 20 MG tablet Commonly known as:  LASIX Take 1 tablet (20 mg total) by mouth daily as needed (for swelling).   glucose blood test strip Commonly known as:  Accu-Chek Aviva Plus Check blood glucose twice daily Dx code E11.65   Accu-Chek Guide test strip Generic drug:  glucose blood CHECK BLOOD SUGAR 2 TIMES DAILY   glucose blood test strip Commonly known as:  Accu-Chek Guide Use to check blood sugars 2 times daily   hydrALAZINE 25 MG tablet Commonly known as:  APRESOLINE Take 1 tablet (25 mg total) by mouth 3 (three) times daily.   Insulin Asp Prot & Asp FlexPen (70-30) 100 UNIT/ML Supn INJECT 10 UNITS SUBCUTANEOUSLY  BEFORE BREAKFAST AND 6 UNITS BEFORE SUPPER (PEN EXPIRES 14 DAYS AFTER OPENING) What changed:  See the new instructions.   losartan 100 MG tablet Commonly known as:  COZAAR Take 1 tablet (100 mg total) by mouth daily.   pioglitazone 15 MG tablet Commonly known as:  ACTOS Take 1 tablet (15 mg total) by mouth daily.   simvastatin 20 MG tablet Commonly known as:  ZOCOR Take 1 tablet (20 mg total) by mouth every evening.   TRUEplus Lancets 28G Misc USE TO TEST BLOOD SUGAR 2 TIMES DAILY       Allergies:  Allergies  Allergen Reactions  . Lasix [Furosemide] Other (See Comments)    Constipation    Past Medical History:  Diagnosis Date  . Allergy   . Anemia   . Asthma   . BPH (benign prostatic hyperplasia)   . CAD (coronary artery disease)    a. s/p MI 09/1993;  b. known CTO of RCA;  c. 06/2014 Cath: LM short/mod dzs, LAD 95ost, LCX nl, RI nl, RCA 100, EF 55%;  c. 06/2014 s/p CABG x 4: LIMA->LAD, VG->D1, VG->OM1, VG->Acute Marginal.  . Carotid  stenosis    a. Carotid US 8/28:  RICA 00-34%; LICA 91-79% >> FU 1 year  . CKD (chronic kidney disease), stage III ([place])   . Constipation  in the past 2-3 weeks  . Diabetes ([place])    AODM  . Diastolic dysfunction    a. 05/2015 Echo: EF 60-65%, LVH, Gr 1 DD, mild MR, mod dil RA/LA, mod TR, PASP 3mHg.  . Essential hypertension   . GERD (gastroesophageal reflux disease)   . Hyperlipidemia     Past Surgical History:  Procedure Laterality Date  . ANGIOPLASTY  09/22/1993   POBA of RCA (Dr. RMarella Chimes  . BACK SURGERY  2000   back fusion - Dr. HEarnie Larsson . CARDIAC CATHETERIZATION  11/28/1998   "silent occlusion" of RCA w/collaterals from left coronary system, prox LAD w/40-50% eccentric narrowing, 1st diagonal with 70-80% eccentric narrowing, 85% narrowing of prox small OM1 (Dr. RMarella Chimes  . CAROTID DOPPLER  07/2011   left subclavian (50-69%); right bulb (0-49%); RICA (normal); left mid-distal CCA (0-49%); left bulb/prox ICA (50-69%); left vertebral with abnormal antegrade flow  . CORONARY ARTERY BYPASS GRAFT N/A 05/21/2014   Procedure: CORONARY ARTERY BYPASS GRAFTING (CABG) x  four, using left internal mammary artery and right leg greater saphenous vein harvested endoscopically;  Surgeon: SMelrose Nakayama MD;  [facility]  Service: Open Heart Surgery;  Laterality: N/A;  . INTRAOPERATIVE TRANSESOPHAGEAL ECHOCARDIOGRAM N/A 05/21/2014   Procedure: INTRAOPERATIVE TRANSESOPHAGEAL ECHOCARDIOGRAM;  Surgeon: SMelrose Nakayama MD;  [facility]  Service: Open Heart Surgery;  Laterality: N/A;  . LEFT HEART CATHETERIZATION WITH CORONARY ANGIOGRAM N/A 05/19/2014   Procedure: LEFT HEART CATHETERIZATION WITH CORONARY ANGIOGRAM;  Surgeon: MBlane Ohara MD;  [facility];  Service: Cardiovascular;  Laterality: N/A;  . NASAL SINUS SURGERY  2010  . NM MYOCAR PERF WALL MOTION  09/19/2009   bruce myoview - mild perfusion defect in basal inferior region (infarct/scar), EF  60%, low risk scan  . RENAL DOPPLER  10/2011   SMA w/ 70-99% diameter reduction & high grade stenosis; R&L renals w/narrowing and increased velocities (60-99%), R kidney smaller than L  . TRANSTHORACIC ECHOCARDIOGRAM  10/20/2012   EF 55-60%, moderate concentric hypertrophy, ventricular septum thickness increased, calcified MV annulus  . VASECTOMY  1963    Family History  Problem Relation Age of Onset  . Uterine cancer Mother   . Stroke Father   . Diabetes Sister   . Colon polyps Brother   . Heart disease Brother   . Colon cancer Neg Hx   . Esophageal cancer Neg Hx   . Rectal cancer Neg Hx   . Stomach cancer Neg Hx     Social History:  reports that he quit smoking about 46 years ago. His smoking use included cigarettes. His smokeless tobacco use includes snuff. He reports current alcohol use of about 3.0 standard drinks of alcohol per week. He reports that he does not use drugs.   Review of Systems   Lipid history: Taking simvastatin 40 mg, most recent LDL:     Lab Results  Component Value Date   CHOL 107 02/17/2018   HDL 52.40 02/17/2018   LDLCALC 42 02/17/2018   LDLDIRECT 45.0 04/17/2017   TRIG 59.0 02/17/2018   CHOLHDL 2 02/17/2018           Hypertension: Blood pressure is controlled with adding hydralazine and he is also on Lasix and chlorthalidone now    BP Readings from Last 3 Encounters:  10/22/18 122/60  08/21/18 (!) 122/54  07/28/18 (!) 148/54   Renal function has been variable and again relatively worse He has been followed by nephrologist now  Ultrasound showed reduced right kidney size  Also on Lasix for edema with control  Lab Results  Component Value Date   CREATININE 2.17 (H) 10/14/2018   CREATININE 2.02 (H) 08/18/2018   CREATININE 1.63 (H) 06/19/2018     Most recent eye exam was 10/19  Most recent foot exam: 9/19   LABS:  No visits with results within 1 Week(s) from this visit.  Latest known visit with results is:  Lab on 10/14/2018   Component Date Value Ref Range Status  . Sodium 10/14/2018 134* 135 - 145 mEq/L Final  . Potassium 10/14/2018 4.0  3.5 - 5.1 mEq/L Final  . Chloride 10/14/2018 97  96 - 112 mEq/L Final  . CO2 10/14/2018 27  19 - 32 mEq/L Final  . Glucose, Bld 10/14/2018 207* 70 - 99 mg/dL Final  . BUN 10/14/2018 42* 6 - 23 mg/dL Final  . Creatinine, Ser 10/14/2018 2.17* 0.40 - 1.50 mg/dL Final  . Calcium 10/14/2018 8.9  8.4 - 10.5 mg/dL Final  . GFR 10/14/2018 29.39* >60.00 mL/min Final  . Fructosamine 10/14/2018 334* 0 - 285 umol/L Final   Comment: Published reference interval for apparently healthy subjects between age 84 and 1 is 67 - 285 umol/L and in a poorly controlled diabetic population is 228 - 563 umol/L with a mean of 396 umol/L.     Physical Examination:  BP 122/60 (BP Location: Left Arm, Patient Position: Sitting, Cuff Size: Normal)   Pulse 71   Temp 97.7 F (36.5 C) (Oral)   Wt 170 lb (77.1 kg)   SpO2 99%   BMI 24.39 kg/m    Feet are normal to inspection   ASSESSMENT:  Diabetes type 2, insulin requiring  See history of present illness for detailed discussion of current diabetes management, blood sugar patterns and problems identified  [name] previously had gone up to 7.5 from 6.5  He is on twice a day premixed insulin along with Actos 15 mg and dinnertime acarbose non-insulin oral medications  Recently his blood sugars are worse from poor diet, lack of exercise Also not clear if he is consistently having high readings after his evening meal which is his largest meal He has not been motivated to do well with his diet or exercise regimen lately  Leg edema: He is doing better with elastic stockings and Lasix  HYPERTENSION and renal dysfunction: Likely worse from lowering blood pressure and adding more diuretics and is due to follow up in 1 week However has not been recommended nephrology consultation which may be desirable Currently does not have any history of  microalbuminuria from diabetes   PLAN:   He will reduce morning insulin down to 8 units Continue taking this 10 to 15 minutes before meals Asked him to follow his written after visit summary instructions as follows Also reminded him to make sure he checks his sugars by rotation at various times including after 1 of the meals He needs to continue exercise No change in pioglitazone or acarbose To call if having any unusually high or low blood sugars consistently  Patient Instructions  Check blood sugars on waking up 3-4 days a week  Also check blood sugars about 2 hours after meals and do this after different meals by rotation  Recommended blood sugar levels on waking up are 90-130 and about 2 hours after meal is 130-180  Please bring your blood sugar monitor to each visit, thank you  No change in insulin  Reduce portions at breakfast  Add a small lunch    Elayne Snare 10/22/2018, 10:13 AM   Note: This office note was prepared with Dragon voice recognition system technology. Any transcriptional errors that result from this process are unintentional.

## 2018-10-22 ENCOUNTER — Encounter: Payer: Self-pay | Admitting: Endocrinology

## 2018-10-22 ENCOUNTER — Ambulatory Visit (INDEPENDENT_AMBULATORY_CARE_PROVIDER_SITE_OTHER): Payer: Medicare HMO | Admitting: Endocrinology

## 2018-10-22 ENCOUNTER — Other Ambulatory Visit: Payer: Self-pay

## 2018-10-22 VITALS — BP 122/60 | HR 71 | Temp 97.7°F | Wt 170.0 lb

## 2018-10-22 DIAGNOSIS — E1165 Type 2 diabetes mellitus with hyperglycemia: Secondary | ICD-10-CM | POA: Diagnosis not present

## 2018-10-22 DIAGNOSIS — Z794 Long term (current) use of insulin: Secondary | ICD-10-CM | POA: Diagnosis not present

## 2018-10-22 NOTE — Patient Instructions (Addendum)
Check blood sugars on waking up 3-4 days a week  Also check blood sugars about 2 hours after meals and do this after different meals by rotation  Recommended blood sugar levels on waking up are 90-130 and about 2 hours after meal is 130-180  Please bring your blood sugar monitor to each visit, thank you  No change in insulin  Reduce portions at breakfast  Add a small lunch

## 2018-10-23 ENCOUNTER — Ambulatory Visit (INDEPENDENT_AMBULATORY_CARE_PROVIDER_SITE_OTHER): Payer: Medicare HMO | Admitting: Family Medicine

## 2018-10-23 ENCOUNTER — Other Ambulatory Visit: Payer: Medicare HMO

## 2018-10-23 VITALS — BP 120/60 | HR 71 | Temp 97.7°F | Ht 70.0 in | Wt 170.0 lb

## 2018-10-23 DIAGNOSIS — N183 Chronic kidney disease, stage 3 unspecified: Secondary | ICD-10-CM

## 2018-10-23 DIAGNOSIS — E1169 Type 2 diabetes mellitus with other specified complication: Secondary | ICD-10-CM

## 2018-10-23 DIAGNOSIS — E1159 Type 2 diabetes mellitus with other circulatory complications: Secondary | ICD-10-CM

## 2018-10-23 DIAGNOSIS — I2510 Atherosclerotic heart disease of native coronary artery without angina pectoris: Secondary | ICD-10-CM

## 2018-10-23 DIAGNOSIS — I152 Hypertension secondary to endocrine disorders: Secondary | ICD-10-CM

## 2018-10-23 DIAGNOSIS — I10 Essential (primary) hypertension: Secondary | ICD-10-CM | POA: Diagnosis not present

## 2018-10-23 DIAGNOSIS — E1122 Type 2 diabetes mellitus with diabetic chronic kidney disease: Secondary | ICD-10-CM | POA: Diagnosis not present

## 2018-10-23 DIAGNOSIS — E785 Hyperlipidemia, unspecified: Secondary | ICD-10-CM

## 2018-10-23 DIAGNOSIS — D508 Other iron deficiency anemias: Secondary | ICD-10-CM

## 2018-10-23 DIAGNOSIS — E1165 Type 2 diabetes mellitus with hyperglycemia: Secondary | ICD-10-CM | POA: Diagnosis not present

## 2018-10-23 NOTE — Assessment & Plan Note (Signed)
Stable.  Continue simvastatin 10 mg daily.  Check lipid panel with next blood draw.

## 2018-10-23 NOTE — Progress Notes (Signed)
    Chief Complaint:  Justin Salinas is a 78 y.o. male who presents for a telephone visit with a chief complaint of HTN follow up.   Assessment/Plan:  Uncontrolled type 2 diabetes mellitus with hyperglycemia, without long-term current use of insulin ([place]) Recently stopped metformin due to CKD.  Sugars have increased a little bit but overall are well controlled.  Continue management per endocrinology.  Iron deficiency anemia Stable.  Continue ferrous sulfate 325 mg every other day.  Check iron panel and CBC with next blood draw.  Hypertension associated with diabetes ([place]) Reported blood pressures are within goal range.  Continue losartan 100 mg daily, Norvasc 10 mg daily, hydralazine 25 mg 3 times daily, and chlorthalidone 25 mg daily.  Dyslipidemia associated with type 2 diabetes mellitus (HCC) Stable.  Continue simvastatin 10 mg daily.  Check lipid panel with next blood draw.  CAD (coronary artery disease) Stable.  On statin.  CKD stage 3 due to type 2 diabetes mellitus (HCC) Stable.  Continue management per nephrology.     Subjective:  HPI:  His stable, chronic medical conditions are outlined below:  # Essential Hypertension - On losartan 100mg  daily, norvasc 10mg  daily, hydralazine 25mg  three times daily, and chlorthalidone 25mg  daily - Home BPs: 130s/60s - ROS: No reported chest pain or shortness of breath  # Dyslipidemia - On simvastatin 10mg  daily and tolerating well.   # Iron Deficiency Anemia - Taking ferrous sulfate 325mg  every other day  # T2DM - Follows with endocrinology - On twice daily insulin, acarbose 25mg  three times daily, and actos 15mg  daily. Tolerating well.  - Home blood sugars typically in the 120s - ROS: No reported polyuria or polydipsia  % CKD Stage 3 - Referred to nephrology  ROS: Per HPI  PMH: He reports that he quit smoking about 46 years ago. His smoking use included cigarettes. His smokeless tobacco use includes snuff. He  reports current alcohol use of about 3.0 standard drinks of alcohol per week. He reports that he does not use drugs.      Objective/Observations   GEN: NAD, speaking in full sentences.   Telephone Visit   I connected with Justin Salinas on 10/23/18 at  8:00 AM EDT via telephone and verified that I am speaking with the correct person using two identifiers. I discussed the limitations of evaluation and management by telemedicine and the availability of in person appointments. The patient expressed understanding and agreed to proceed.   Patient location: Home Provider location: [place] participating in the virtual visit: Myself and patient  A total of 22 minutes were spent on medical discussion.      Justin Salinas. [name], MD 10/23/2018 8:25 AM

## 2018-10-23 NOTE — Assessment & Plan Note (Signed)
Reported blood pressures are within goal range.  Continue losartan 100 mg daily, Norvasc 10 mg daily, hydralazine 25 mg 3 times daily, and chlorthalidone 25 mg daily.

## 2018-10-23 NOTE — Assessment & Plan Note (Signed)
Stable.  Continue management per nephrology. 

## 2018-10-23 NOTE — Assessment & Plan Note (Addendum)
Stable.  Continue ferrous sulfate 325 mg every other day.  Check iron panel and CBC with next blood draw.

## 2018-10-23 NOTE — Assessment & Plan Note (Signed)
Recently stopped metformin due to CKD.  Sugars have increased a little bit but overall are well controlled.  Continue management per endocrinology.

## 2018-10-23 NOTE — Assessment & Plan Note (Signed)
Stable.  On statin. 

## 2018-12-10 DIAGNOSIS — N401 Enlarged prostate with lower urinary tract symptoms: Secondary | ICD-10-CM | POA: Diagnosis not present

## 2018-12-10 DIAGNOSIS — R351 Nocturia: Secondary | ICD-10-CM | POA: Diagnosis not present

## 2018-12-10 DIAGNOSIS — R3915 Urgency of urination: Secondary | ICD-10-CM | POA: Diagnosis not present

## 2018-12-22 DIAGNOSIS — E1122 Type 2 diabetes mellitus with diabetic chronic kidney disease: Secondary | ICD-10-CM | POA: Diagnosis not present

## 2018-12-22 DIAGNOSIS — N400 Enlarged prostate without lower urinary tract symptoms: Secondary | ICD-10-CM | POA: Diagnosis not present

## 2018-12-22 DIAGNOSIS — D631 Anemia in chronic kidney disease: Secondary | ICD-10-CM | POA: Diagnosis not present

## 2018-12-22 DIAGNOSIS — N183 Chronic kidney disease, stage 3 (moderate): Secondary | ICD-10-CM | POA: Diagnosis not present

## 2018-12-22 DIAGNOSIS — I129 Hypertensive chronic kidney disease with stage 1 through stage 4 chronic kidney disease, or unspecified chronic kidney disease: Secondary | ICD-10-CM | POA: Diagnosis not present

## 2018-12-22 LAB — IRON,TIBC AND FERRITIN PANEL
%SAT: 36
Ferritin: 37
Iron: 95
TIBC: 265
UIBC: 170

## 2018-12-22 LAB — BASIC METABOLIC PANEL
BUN: 42 — AB (ref 4–21)
Creatinine: 2 — AB (ref 0.6–1.3)
Glucose: 159
Potassium: 5.9 — AB (ref 3.4–5.3)
Sodium: 139 (ref 137–147)

## 2018-12-22 LAB — CBC AND DIFFERENTIAL: Hemoglobin: 11.8 — AB (ref 13.5–17.5)

## 2019-01-06 ENCOUNTER — Ambulatory Visit (INDEPENDENT_AMBULATORY_CARE_PROVIDER_SITE_OTHER): Payer: Medicare HMO | Admitting: Physician Assistant

## 2019-01-06 ENCOUNTER — Encounter: Payer: Self-pay | Admitting: Physician Assistant

## 2019-01-06 VITALS — BP 124/64 | Temp 96.9°F

## 2019-01-06 DIAGNOSIS — R0981 Nasal congestion: Secondary | ICD-10-CM

## 2019-01-06 DIAGNOSIS — Z7189 Other specified counseling: Secondary | ICD-10-CM | POA: Diagnosis not present

## 2019-01-06 MED ORDER — DOXYCYCLINE HYCLATE 100 MG PO TABS: 100.0000 mg | ORAL_TABLET | Freq: Two times a day (BID) | ORAL | 0 refills | Status: DC

## 2019-01-06 NOTE — Progress Notes (Signed)
TELEPHONE ENCOUNTER   Patient verbally agreed to telephone visit and is aware that copayment and coinsurance may apply. Patient was treated using telemedicine according to accepted telemedicine protocols.  Location of the patient: home Location of provider: [place] Names of all persons participating in the telemedicine service and role in the encounter: Inda Coke, [place]   I acted as a Education administrator for [employer], PA-C Anselmo Pickler, LPN  Subjective:   Chief Complaint  Patient presents with   Sinus Problem     HPI   Sinus problem Pt c/o sinus issue x 2 weeks, pressure around eyes. Nasal congestion, clear drainage. Denies fever, chills, headache, cough. He is using Saline nasal spray and vicks vapor rub.  Has some SOB when he tries to breathe through his nose. When he breathes through his mouth he has slight SOB that is not unusual for him.  Denies any COVID-19 contacts that he is aware of.  Lab Results  Component Value Date   HGBA1C 7.5 (H) 08/18/2018     Patient Active Problem List   Diagnosis Date Noted   Iron deficiency anemia 10/22/2017   Uncontrolled type 2 diabetes mellitus with hyperglycemia, without long-term current use of insulin ([place]) 25/10/3974   Diastolic dysfunction    Hypertension associated with diabetes ([place])    CKD stage 3 due to type 2 diabetes mellitus (HCC)    S/P CABG x 4 05/21/2014   Carotid artery disease ([place]) 12/01/2007   DEGENERATIVE [place] DISEASE 12/01/2007   BPH (benign prostatic hyperplasia) 12/01/2007   Dyslipidemia associated with type 2 diabetes mellitus ([place]) 11/28/2007   CAD (coronary artery disease) 11/28/2007   GERD 11/28/2007   DEGENERATIVE JOINT DISEASE 11/28/2007   Social History   Tobacco Use   Smoking status: Former Smoker    Types: Cigarettes    Quit date: 06/04/1972    Years since quitting: 46.6   Smokeless tobacco: Current User    Types: Snuff   Tobacco comment: quit about 40  years  Substance Use Topics   Alcohol use: Yes    Alcohol/week: 3.0 standard drinks    Types: 3 Standard drinks or equivalent per week    Comment: a glass of red wine 3 or 4 days a week.    Current Outpatient Medications:    acarbose (PRECOSE) 25 MG tablet, TAKE 1 TABLET THREE TIMES DAILY WITH MEALS., Disp: 270 tablet, Rfl: 0   ACCU-CHEK GUIDE test strip, CHECK BLOOD SUGAR 2 TIMES DAILY, Disp: 200 each, Rfl: 2   alfuzosin (UROXATRAL) 10 MG 24 hr tablet, Take 10 mg by mouth daily with breakfast., Disp: , Rfl:    amLODipine (NORVASC) 10 MG tablet, Take 1 tablet (10 mg total) by mouth daily., Disp: 90 tablet, Rfl: 3   aspirin 81 MG tablet, Take 81 mg by mouth daily., Disp: , Rfl:    DROPLET PEN NEEDLES 31G X 5 MM MISC, USE AS DIRECTED TWICE DAILY, Disp: 200 each, Rfl: 2   furosemide (LASIX) 20 MG tablet, Take 1 tablet (20 mg total) by mouth daily as needed (for swelling)., Disp: 90 tablet, Rfl: 2   glucose blood (ACCU-CHEK AVIVA PLUS) test strip, Check blood glucose twice daily Dx code E11.65, Disp: 100 each, Rfl: 2   glucose blood (ACCU-CHEK GUIDE) test strip, Use to check blood sugars 2 times daily, Disp: 200 each, Rfl: 1   hydrALAZINE (APRESOLINE) 25 MG tablet, Take 1 tablet (25 mg total) by mouth 3 (three) times daily., Disp: 270 tablet,  Rfl: 3   Insulin Aspart Prot & Aspart (INSULIN ASP PROT & ASP FLEXPEN) (70-30) 100 UNIT/ML SUPN, INJECT 10 UNITS SUBCUTANEOUSLY  BEFORE BREAKFAST AND 6 UNITS BEFORE SUPPER (PEN EXPIRES 14 DAYS AFTER OPENING) (Patient taking differently: Taking 8 units in AM and 12 units in PM), Disp: 30 mL, Rfl: 3   Lancets Misc. (ACCU-CHEK SOFTCLIX LANCET DEV) KIT, , Disp: , Rfl:    losartan (COZAAR) 100 MG tablet, Take 1 tablet (100 mg total) by mouth daily., Disp: 90 tablet, Rfl: 3   Multiple Vitamins-Minerals (CENTRUM SILVER ADULT 50+ PO), Take by mouth., Disp: , Rfl:    pioglitazone (ACTOS) 15 MG tablet, Take 1 tablet (15 mg total) by mouth daily., Disp:  90 tablet, Rfl: 3   simvastatin (ZOCOR) 20 MG tablet, Take 1 tablet (20 mg total) by mouth every evening., Disp: 90 tablet, Rfl: 3   TRUEplus Lancets 28G MISC, USE TO TEST BLOOD SUGAR 2 TIMES DAILY, Disp: 200 each, Rfl: 1   chlorthalidone (HYGROTON) 25 MG tablet, Take 1 tablet (25 mg total) by mouth daily., Disp: 30 tablet, Rfl: 11   doxycycline (VIBRA-TABS) 100 MG tablet, Take 1 tablet (100 mg total) by mouth 2 (two) times daily., Disp: 20 tablet, Rfl: 0 Allergies  Allergen Reactions   Lasix [Furosemide] Other (See Comments)    Constipation    Assessment & Plan:   1. Sinus congestion   2. Advice Given About Covid-19 Virus Infection    No red flags on exam.  Will initiate doxycycline per orders. Discussed taking medications as prescribed. Reviewed return precautions including worsening fever, SOB, worsening cough or other concerns. Push fluids and rest. I recommend that patient follow-up if symptoms worsen or persist despite treatment x 7-10 days, sooner if needed.  *Discussed that if he would like, we could arrange for him to get tested for COVID-19. He would like to go to the [facility] and be tested there instead. I asked him to call us and update [place] if he is able to go.  No orders of the defined types were placed in this encounter.  Meds ordered this encounter  Medications   doxycycline (VIBRA-TABS) 100 MG tablet    Sig: Take 1 tablet (100 mg total) by mouth 2 (two) times daily.    Dispense:  20 tablet    Refill:  0    Order Specific Question:   Supervising Provider    Answer:   Juleen China, ERICA [place], PA 01/06/2019  Time spent with the patient: 12 minutes, spent in obtaining information about his symptoms, reviewing his previous labs, evaluations, and treatments, counseling him about his condition (please see the discussed topics above), and developing a plan to further investigate it; he had a number of questions which I addressed.

## 2019-01-07 ENCOUNTER — Telehealth: Payer: Self-pay | Admitting: *Deleted

## 2019-01-07 DIAGNOSIS — Z20828 Contact with and (suspected) exposure to other viral communicable diseases: Secondary | ICD-10-CM | POA: Diagnosis not present

## 2019-01-07 NOTE — Telephone Encounter (Signed)
Pt called to let us know he went for COVID-19 testing today in [place]. He will call us when results come back.

## 2019-01-12 ENCOUNTER — Encounter: Payer: Self-pay | Admitting: Family Medicine

## 2019-01-12 LAB — NOVEL CORONAVIRUS, NAA: SARS-CoV-2, NAA: NEGATIVE

## 2019-01-12 NOTE — Telephone Encounter (Signed)
Pt called and said his COVID-19 testing came back Negative. Told him okay good, I will let Samantha know. If you need anything else please let us know. Pt verbalized understanding.

## 2019-01-12 NOTE — Telephone Encounter (Signed)
FYI, COVID-19 Negative per pt. Chart updated.

## 2019-01-20 NOTE — Progress Notes (Signed)
Patient ID: Justin Salinas, male   DOB: 1940-07-09, 78 y.o.   MRN: [id number]           Reason for Appointment: Follow-up for Type 2 Diabetes    History of Present Illness:          Date of diagnosis of type 2 diabetes mellitus:2000        Background history:   His diabetes had been mild initially and treated with metformin He had subsequently other medications added including Amaryl and Januvia No details are available from PCP office, [name] in 2015 was 7.6  Because of his poor control was started on insulin on 01/03/17. He has been on Actos since 06/2017+ He had a baseline A1c A1c 9.7 on his initial consultation in 01/2017  Recent history:   Insulin regimen: NovoLog mix, 8 units at breakfast and 12 at supper  Non-insulin hypoglycemic drugs the patient is taking are: Actos 15 mg daily, acarbose at dinnertime daily  A1c in March was 7.5, now 6.6   Current management, blood sugar patterns and problems identified:  His insulin dose was reduced to 8 units in the morning since she had a low normal sugar of 69 before dinnertime previously  Also has fructosamine was reasonably good on last visit  Because of the difficulty with memory he only remembers to check his sugar when he is taking his insulin at breakfast and dinnertime  FASTING blood sugars are appearing to be close to normal with lowest reading 74 recently  However most of his patient suppertime readings are at least 150 and above  He does not know what makes his blood sugar go up to as high as 296 at about 5 PM  He does eat a sandwich at lunch but does not think he is eating a lot of snacks in the afternoon  He is walking about an hour in the mornings between 8/10 a.m.  Also he has reported feeling a little jittery with tingling in his legs around bedtime a couple of times in the last 2 weeks relieved by glucose tablets but he did not check his sugar at that time        Side effects from medications have been:  None  Compliance with the medical regimen: Good  Glucose monitoring:  done 2 times a day         Glucometer:  Accu-Chek guide me .      Blood Glucose readings by review of monitor:   PRE-MEAL Fasting Lunch Dinner Bedtime Overall  Glucose range:  74-105   155-296    Mean/median:     145   Previous data:  PRE-MEAL Fasting Lunch Dinner Bedtime Overall  Glucose range:  79-151   XT 9-312    Mean/median:  112   154   131, was 150     Self-care: The diet that the patient has been following is: tries to limit sweets, fried food       Typical meal intake: Breakfast is eggs meat, toast.  Lunch: Sandwich or.Crackers with or without peanut butter usually                Dietician visit, most recent: 03/2017   Weight history:   Wt Readings from Last 3 Encounters:  01/21/19 175 lb (79.4 kg)  10/22/18 170 lb (77.1 kg)  10/22/18 170 lb (77.1 kg)    Glycemic control:   Lab Results  Component Value Date   HGBA1C 6.4 (A) 01/21/2019  HGBA1C 7.5 (H) 08/18/2018   HGBA1C 6.5 (A) 05/19/2018   Lab Results  Component Value Date   MICROALBUR <0.7 02/17/2018   LDLCALC 42 02/17/2018   CREATININE 2.0 (A) 12/22/2018   Lab Results  Component Value Date   MICRALBCREAT 1.3 02/17/2018   Microalbumin ratio 9 in 12/2015  Lab Results  Component Value Date   FRUCTOSAMINE 334 (H) 10/14/2018   FRUCTOSAMINE 351 (H) 02/14/2017      Allergies as of 01/21/2019      Reactions   Lasix [furosemide] Other (See Comments)   Constipation      Medication List       Accurate as of January 21, 2019  8:44 AM. If you have any questions, ask your nurse or doctor.        STOP taking these medications   doxycycline 100 MG tablet Commonly known as: VIBRA-TABS Stopped by: Elayne Snare, MD     TAKE these medications   acarbose 25 MG tablet Commonly known as: PRECOSE TAKE 1 TABLET THREE TIMES DAILY WITH MEALS.   Accu-Chek Softclix Lancet Dev Kit   alfuzosin 10 MG 24 hr tablet Commonly known as:  UROXATRAL Take 10 mg by mouth daily with breakfast.   amLODipine 10 MG tablet Commonly known as: NORVASC Take 1 tablet (10 mg total) by mouth daily.   aspirin 81 MG tablet Take 81 mg by mouth daily.   CENTRUM SILVER ADULT 50+ PO Take by mouth.   chlorthalidone 25 MG tablet Commonly known as: HYGROTON Take 1 tablet (25 mg total) by mouth daily.   Droplet Pen Needles 31G X 5 MM Misc Generic drug: Insulin Pen Needle USE AS DIRECTED TWICE DAILY   furosemide 20 MG tablet Commonly known as: LASIX Take 1 tablet (20 mg total) by mouth daily as needed (for swelling).   glucose blood test strip Commonly known as: Accu-Chek Aviva Plus Check blood glucose twice daily Dx code E11.65   Accu-Chek Guide test strip Generic drug: glucose blood CHECK BLOOD SUGAR 2 TIMES DAILY   glucose blood test strip Commonly known as: Accu-Chek Guide Use to check blood sugars 2 times daily   hydrALAZINE 25 MG tablet Commonly known as: APRESOLINE Take 1 tablet (25 mg total) by mouth 3 (three) times daily.   Insulin Asp Prot & Asp FlexPen (70-30) 100 UNIT/ML Supn INJECT 10 UNITS SUBCUTANEOUSLY  BEFORE BREAKFAST AND 6 UNITS BEFORE SUPPER (PEN EXPIRES 14 DAYS AFTER OPENING) What changed: See the new instructions.   losartan 100 MG tablet Commonly known as: COZAAR Take 1 tablet (100 mg total) by mouth daily.   pioglitazone 15 MG tablet Commonly known as: ACTOS Take 1 tablet (15 mg total) by mouth daily.   simvastatin 20 MG tablet Commonly known as: ZOCOR Take 1 tablet (20 mg total) by mouth every evening.   TRUEplus Lancets 28G Misc USE TO TEST BLOOD SUGAR 2 TIMES DAILY       Allergies:  Allergies  Allergen Reactions  . Lasix [Furosemide] Other (See Comments)    Constipation    Past Medical History:  Diagnosis Date  . Allergy   . Anemia   . Asthma   . BPH (benign prostatic hyperplasia)   . CAD (coronary artery disease)    a. s/p MI 09/1993;  b. known CTO of RCA;  c. 06/2014 Cath:  LM short/mod dzs, LAD 95ost, LCX nl, RI nl, RCA 100, EF 55%;  c. 06/2014 s/p CABG x 4: LIMA->LAD, VG->D1, VG->OM1, VG->Acute Marginal.  . Carotid  stenosis    a. Carotid US 6/78:  RICA 93-81%; LICA 01-75% >> FU 1 year  . CKD (chronic kidney disease), stage III ([place])   . Constipation    in the past 2-3 weeks  . Diabetes ([place])    AODM  . Diastolic dysfunction    a. 05/2015 Echo: EF 60-65%, LVH, Gr 1 DD, mild MR, mod dil RA/LA, mod TR, PASP 56mHg.  . Essential hypertension   . GERD (gastroesophageal reflux disease)   . Hyperlipidemia     Past Surgical History:  Procedure Laterality Date  . ANGIOPLASTY  09/22/1993   POBA of RCA (Dr. RMarella Chimes  . BACK SURGERY  2000   back fusion - Dr. HEarnie Larsson . CARDIAC CATHETERIZATION  11/28/1998   "silent occlusion" of RCA w/collaterals from left coronary system, prox LAD w/40-50% eccentric narrowing, 1st diagonal with 70-80% eccentric narrowing, 85% narrowing of prox small OM1 (Dr. RMarella Chimes  . CAROTID DOPPLER  07/2011   left subclavian (50-69%); right bulb (0-49%); RICA (normal); left mid-distal CCA (0-49%); left bulb/prox ICA (50-69%); left vertebral with abnormal antegrade flow  . CORONARY ARTERY BYPASS GRAFT N/A 05/21/2014   Procedure: CORONARY ARTERY BYPASS GRAFTING (CABG) x  four, using left internal mammary artery and right leg greater saphenous vein harvested endoscopically;  Surgeon: SMelrose Nakayama MD;  [facility]  Service: Open Heart Surgery;  Laterality: N/A;  . INTRAOPERATIVE TRANSESOPHAGEAL ECHOCARDIOGRAM N/A 05/21/2014   Procedure: INTRAOPERATIVE TRANSESOPHAGEAL ECHOCARDIOGRAM;  Surgeon: SMelrose Nakayama MD;  [facility]  Service: Open Heart Surgery;  Laterality: N/A;  . LEFT HEART CATHETERIZATION WITH CORONARY ANGIOGRAM N/A 05/19/2014   Procedure: LEFT HEART CATHETERIZATION WITH CORONARY ANGIOGRAM;  Surgeon: MBlane Ohara MD;  [facility];  Service: Cardiovascular;  Laterality: N/A;  . NASAL  SINUS SURGERY  2010  . NM MYOCAR PERF WALL MOTION  09/19/2009   bruce myoview - mild perfusion defect in basal inferior region (infarct/scar), EF 60%, low risk scan  . RENAL DOPPLER  10/2011   SMA w/ 70-99% diameter reduction & high grade stenosis; R&L renals w/narrowing and increased velocities (60-99%), R kidney smaller than L  . TRANSTHORACIC ECHOCARDIOGRAM  10/20/2012   EF 55-60%, moderate concentric hypertrophy, ventricular septum thickness increased, calcified MV annulus  . VASECTOMY  1963    Family History  Problem Relation Age of Onset  . Uterine cancer Mother   . Stroke Father   . Diabetes Sister   . Colon polyps Brother   . Heart disease Brother   . Colon cancer Neg Hx   . Esophageal cancer Neg Hx   . Rectal cancer Neg Hx   . Stomach cancer Neg Hx     Social History:  reports that he quit smoking about 46 years ago. His smoking use included cigarettes. His smokeless tobacco use includes snuff. He reports current alcohol use of about 3.0 standard drinks of alcohol per week. He reports that he does not use drugs.   Review of Systems   Lipid history: Taking simvastatin 40 mg, most recent LDL:  This is prescribed by cardiologist and does not apparently have a follow-up visit scheduled yet   Lab Results  Component Value Date   CHOL 107 02/17/2018   HDL 52.40 02/17/2018   LDLCALC 42 02/17/2018   LDLDIRECT 45.0 04/17/2017   TRIG 59.0 02/17/2018   CHOLHDL 2 02/17/2018           Hypertension: Blood pressure is controlled with adding hydralazine  and he is also on Lasix and chlorthalidone now    BP Readings from Last 3 Encounters:  01/21/19 130/68  01/06/19 124/64  10/22/18 120/60   Renal function has been variable and again relatively worse He has been followed by nephrologist Ultrasound showed reduced right kidney size  Also on Lasix for edema However he has had more edema lately  Lab Results  Component Value Date   CREATININE 2.0 (A) 12/22/2018   CREATININE  2.17 (H) 10/14/2018   CREATININE 2.02 (H) 08/18/2018    HYPERKALEMIA: He is not following a low potassium diet even though he apparently has been given a list of foods to avoid, had a banana today Last potassium was significantly high Due to follow-up with nephrologist soon  Lab Results  Component Value Date   K 5.9 (A) 12/22/2018    Most recent eye exam was 10/19  Most recent foot exam: 9/19   LABS:  Office Visit on 01/21/2019  Component Date Value Ref Range Status  . Hemoglobin A1C 01/21/2019 6.4* 4.0 - 5.6 % Final    Physical Examination:  BP 130/68 (BP Location: Left Arm, Patient Position: Sitting, Cuff Size: Normal)   Pulse 72   Ht 5' 10" (1.778 m)   Wt 175 lb (79.4 kg)   SpO2 98%   BMI 25.11 kg/m    1+ edema ankles   ASSESSMENT:  Diabetes type 2, insulin requiring  See history of present illness for detailed discussion of current diabetes management, blood sugar patterns and problems identified  [name] previously had gone up to 7.5 and now back to 6.4  He is on twice a day premixed insulin along with Actos 15 mg and dinnertime acarbose non-insulin oral medications  [name] indicates better control although his blood sugars are usually high at dinnertime He does not think his diet has been consistently poor Unable to judge what his morning insulin should be since he does not check blood sugars after breakfast and before lunch Also is going for walks in the mornings and would not like to increase his morning insulin  Leg edema: He is having more edema now, may not be wearing elastic stockings regularly but one factor may be taking Actos  HYPERTENSION and renal dysfunction:  Follow-up with nephrologist  HYPERKALEMIA, reviewed low potassium diet and he will also start watching his foods that are high in potassium based on the diet sheet given by nephrologist  Lipids: He will need to call his cardiologist to set up follow-up   PLAN:   He will reduce  DINNERTIME insulin down to 10 units Continue taking insulin 10 to 15 minutes before meals Instead of taking acarbose at dinnertime we will take this at lunchtime only Stop pioglitazone Given written instructions on when to check his blood sugars and blood sugar targets He needs to check his blood sugar when he feels hypoglycemic To call if blood sugars are consistently over 200 or having low sugars again  Also see above discussion    Patient Instructions  Reduce supper dose to 10 units  ACARBOSE WHEN STARTING TO EAT LUNCH AND not AT SUPPER  STOP PIOGLITAZONE  .Check blood sugars on waking up 3-4 days a week  Also check blood sugars about 2 hours after meals and do this after different meals by rotation  Recommended blood sugar levels on waking up are 90-130 and about 2 hours after meal is 130-180  Please bring your blood sugar monitor to each visit, thank you  Total visit time for evaluation and management of multiple problems and counseling =25 minutes  Elayne Snare 01/21/2019, 8:44 AM   Note: This office note was prepared with Dragon voice recognition system technology. Any transcriptional errors that result from this process are unintentional.

## 2019-01-21 ENCOUNTER — Other Ambulatory Visit: Payer: Self-pay

## 2019-01-21 ENCOUNTER — Encounter: Payer: Self-pay | Admitting: Endocrinology

## 2019-01-21 ENCOUNTER — Ambulatory Visit: Payer: Medicare HMO | Admitting: Endocrinology

## 2019-01-21 VITALS — BP 130/68 | HR 72 | Ht 70.0 in | Wt 175.0 lb

## 2019-01-21 DIAGNOSIS — Z794 Long term (current) use of insulin: Secondary | ICD-10-CM

## 2019-01-21 DIAGNOSIS — E782 Mixed hyperlipidemia: Secondary | ICD-10-CM | POA: Diagnosis not present

## 2019-01-21 DIAGNOSIS — E1165 Type 2 diabetes mellitus with hyperglycemia: Secondary | ICD-10-CM | POA: Diagnosis not present

## 2019-01-21 DIAGNOSIS — E875 Hyperkalemia: Secondary | ICD-10-CM | POA: Diagnosis not present

## 2019-01-21 DIAGNOSIS — R600 Localized edema: Secondary | ICD-10-CM | POA: Diagnosis not present

## 2019-01-21 LAB — POCT GLYCOSYLATED HEMOGLOBIN (HGB A1C): Hemoglobin A1C: 6.4 % — AB (ref 4.0–5.6)

## 2019-01-21 NOTE — Patient Instructions (Addendum)
Reduce supper dose to 10 units  ACARBOSE WHEN STARTING TO EAT LUNCH AND not AT SUPPER  STOP PIOGLITAZONE  .Check blood sugars on waking up 3-4 days a week  Also check blood sugars about 2 hours after meals and do this after different meals by rotation  Recommended blood sugar levels on waking up are 90-130 and about 2 hours after meal is 130-180  Please bring your blood sugar monitor to each visit, thank you

## 2019-01-29 ENCOUNTER — Other Ambulatory Visit: Payer: Self-pay | Admitting: Family Medicine

## 2019-02-03 DIAGNOSIS — N183 Chronic kidney disease, stage 3 (moderate): Secondary | ICD-10-CM | POA: Diagnosis not present

## 2019-02-05 ENCOUNTER — Other Ambulatory Visit: Payer: Self-pay | Admitting: Cardiovascular Disease

## 2019-03-02 ENCOUNTER — Ambulatory Visit (INDEPENDENT_AMBULATORY_CARE_PROVIDER_SITE_OTHER): Payer: Medicare HMO

## 2019-03-02 ENCOUNTER — Other Ambulatory Visit: Payer: Self-pay

## 2019-03-02 DIAGNOSIS — Z23 Encounter for immunization: Secondary | ICD-10-CM

## 2019-03-05 ENCOUNTER — Ambulatory Visit (INDEPENDENT_AMBULATORY_CARE_PROVIDER_SITE_OTHER): Payer: Medicare HMO | Admitting: Family Medicine

## 2019-03-05 ENCOUNTER — Encounter: Payer: Self-pay | Admitting: Family Medicine

## 2019-03-05 VITALS — BP 155/69 | Temp 95.9°F

## 2019-03-05 DIAGNOSIS — H9190 Unspecified hearing loss, unspecified ear: Secondary | ICD-10-CM | POA: Diagnosis not present

## 2019-03-05 NOTE — Progress Notes (Addendum)
    Chief Complaint:  Justin Salinas is a 78 y.o. male who presents for a telephone visit with a chief complaint of Hearing Loss.   Assessment/Plan:  Hearing Loss No red flags.  Will place referral to audiology per patient request.     Subjective:  HPI:  Hearing Loss Chronic problem. Recently lost his hearing aids and has had some difficulty with hearing. Wants to [facility] to [facility] hearing and speech.   ROS: Per HPI  PMH: He reports that he quit smoking about 46 years ago. His smoking use included cigarettes. His smokeless tobacco use includes snuff. He reports current alcohol use of about 3.0 standard drinks of alcohol per week. He reports that he does not use drugs.      Objective/Observations    Telephone Visit   I connected with Justin Salinas on 03/05/19 at 11:20 AM EDT via telephone and verified that I am speaking with the correct person using two identifiers. I discussed the limitations of evaluation and management by telemedicine and the availability of in person appointments. The patient expressed understanding and agreed to proceed.   Patient location: Home Provider location: [place] participating in the virtual visit: Myself and Patient  A total of 5 minutes were spent on medical discussion.      [name]. [name], MD 03/05/2019 10:18 AM

## 2019-03-09 DIAGNOSIS — Z20828 Contact with and (suspected) exposure to other viral communicable diseases: Secondary | ICD-10-CM | POA: Diagnosis not present

## 2019-03-18 ENCOUNTER — Other Ambulatory Visit: Payer: Self-pay | Admitting: Endocrinology

## 2019-03-20 ENCOUNTER — Other Ambulatory Visit: Payer: Self-pay | Admitting: Endocrinology

## 2019-03-26 ENCOUNTER — Other Ambulatory Visit: Payer: Self-pay | Admitting: Cardiovascular Disease

## 2019-03-26 DIAGNOSIS — H90A22 Sensorineural hearing loss, unilateral, left ear, with restricted hearing on the contralateral side: Secondary | ICD-10-CM | POA: Diagnosis not present

## 2019-03-26 DIAGNOSIS — Z974 Presence of external hearing-aid: Secondary | ICD-10-CM | POA: Diagnosis not present

## 2019-03-26 DIAGNOSIS — H90A31 Mixed conductive and sensorineural hearing loss, unilateral, right ear with restricted hearing on the contralateral side: Secondary | ICD-10-CM | POA: Diagnosis not present

## 2019-03-26 DIAGNOSIS — E1165 Type 2 diabetes mellitus with hyperglycemia: Secondary | ICD-10-CM

## 2019-04-02 DIAGNOSIS — Z981 Arthrodesis status: Secondary | ICD-10-CM | POA: Diagnosis not present

## 2019-04-02 DIAGNOSIS — H90A22 Sensorineural hearing loss, unilateral, left ear, with restricted hearing on the contralateral side: Secondary | ICD-10-CM | POA: Diagnosis not present

## 2019-04-02 DIAGNOSIS — Z794 Long term (current) use of insulin: Secondary | ICD-10-CM | POA: Diagnosis not present

## 2019-04-02 DIAGNOSIS — Z570 Occupational exposure to noise: Secondary | ICD-10-CM | POA: Diagnosis not present

## 2019-04-02 DIAGNOSIS — H903 Sensorineural hearing loss, bilateral: Secondary | ICD-10-CM | POA: Insufficient documentation

## 2019-04-02 DIAGNOSIS — H905 Unspecified sensorineural hearing loss: Secondary | ICD-10-CM | POA: Diagnosis not present

## 2019-04-02 DIAGNOSIS — Z7982 Long term (current) use of aspirin: Secondary | ICD-10-CM | POA: Diagnosis not present

## 2019-04-02 DIAGNOSIS — Z951 Presence of aortocoronary bypass graft: Secondary | ICD-10-CM | POA: Diagnosis not present

## 2019-04-02 DIAGNOSIS — E1136 Type 2 diabetes mellitus with diabetic cataract: Secondary | ICD-10-CM | POA: Diagnosis not present

## 2019-04-02 DIAGNOSIS — H6123 Impacted cerumen, bilateral: Secondary | ICD-10-CM | POA: Diagnosis not present

## 2019-04-02 DIAGNOSIS — Z974 Presence of external hearing-aid: Secondary | ICD-10-CM | POA: Diagnosis not present

## 2019-04-07 ENCOUNTER — Telehealth: Payer: Self-pay | Admitting: Cardiovascular Disease

## 2019-04-07 NOTE — Telephone Encounter (Signed)
° ° °  Scheduler left msg [id number] to change appointment from 11/5 with Phylliss Bob to 11/6 with Kathlen Mody. 11/5 clinic starts at 9

## 2019-04-08 ENCOUNTER — Ambulatory Visit: Payer: Medicare HMO | Admitting: Cardiology

## 2019-04-08 DIAGNOSIS — IMO0001 Reserved for inherently not codable concepts without codable children: Secondary | ICD-10-CM | POA: Insufficient documentation

## 2019-04-09 ENCOUNTER — Ambulatory Visit: Payer: Medicare HMO | Admitting: Cardiology

## 2019-04-10 ENCOUNTER — Ambulatory Visit: Payer: Medicare HMO | Admitting: Physician Assistant

## 2019-04-21 DIAGNOSIS — D649 Anemia, unspecified: Secondary | ICD-10-CM | POA: Diagnosis not present

## 2019-04-21 DIAGNOSIS — I6523 Occlusion and stenosis of bilateral carotid arteries: Secondary | ICD-10-CM | POA: Diagnosis not present

## 2019-04-21 DIAGNOSIS — N183 Chronic kidney disease, stage 3 unspecified: Secondary | ICD-10-CM | POA: Diagnosis not present

## 2019-04-21 DIAGNOSIS — I2510 Atherosclerotic heart disease of native coronary artery without angina pectoris: Secondary | ICD-10-CM | POA: Diagnosis not present

## 2019-04-21 DIAGNOSIS — I440 Atrioventricular block, first degree: Secondary | ICD-10-CM | POA: Diagnosis not present

## 2019-04-21 DIAGNOSIS — I129 Hypertensive chronic kidney disease with stage 1 through stage 4 chronic kidney disease, or unspecified chronic kidney disease: Secondary | ICD-10-CM | POA: Diagnosis not present

## 2019-04-21 DIAGNOSIS — E1122 Type 2 diabetes mellitus with diabetic chronic kidney disease: Secondary | ICD-10-CM | POA: Diagnosis not present

## 2019-04-21 DIAGNOSIS — Z87891 Personal history of nicotine dependence: Secondary | ICD-10-CM | POA: Diagnosis not present

## 2019-04-21 DIAGNOSIS — Z20828 Contact with and (suspected) exposure to other viral communicable diseases: Secondary | ICD-10-CM | POA: Diagnosis not present

## 2019-04-21 DIAGNOSIS — H90A22 Sensorineural hearing loss, unilateral, left ear, with restricted hearing on the contralateral side: Secondary | ICD-10-CM | POA: Diagnosis not present

## 2019-04-21 DIAGNOSIS — H832X1 Labyrinthine dysfunction, right ear: Secondary | ICD-10-CM | POA: Diagnosis not present

## 2019-04-21 DIAGNOSIS — R001 Bradycardia, unspecified: Secondary | ICD-10-CM | POA: Diagnosis not present

## 2019-04-21 DIAGNOSIS — H903 Sensorineural hearing loss, bilateral: Secondary | ICD-10-CM | POA: Diagnosis not present

## 2019-04-21 DIAGNOSIS — Z01812 Encounter for preprocedural laboratory examination: Secondary | ICD-10-CM | POA: Diagnosis not present

## 2019-04-23 ENCOUNTER — Other Ambulatory Visit: Payer: Self-pay

## 2019-04-24 ENCOUNTER — Encounter (INDEPENDENT_AMBULATORY_CARE_PROVIDER_SITE_OTHER): Payer: Self-pay

## 2019-04-24 ENCOUNTER — Ambulatory Visit (INDEPENDENT_AMBULATORY_CARE_PROVIDER_SITE_OTHER): Payer: Medicare HMO

## 2019-04-24 VITALS — BP 132/64

## 2019-04-24 DIAGNOSIS — Z0000 Encounter for general adult medical examination without abnormal findings: Secondary | ICD-10-CM

## 2019-04-24 NOTE — Patient Instructions (Signed)
Justin Salinas , Thank you for taking time to come for your Medicare Wellness Visit. I appreciate your ongoing commitment to your health goals. Please review the following plan we discussed and let me know if I can assist you in the future.   Screening recommendations/referrals: Colorectal Screening: up to date; last 12/10/16 with Dr. Loletha Carrow   Vision and Dental Exams: Recommended annual ophthalmology exams for early detection of glaucoma and other disorders of the eye Recommended annual dental exams for proper oral hygiene  Diabetic Exams: Diabetic Eye Exam: recommended yearly; upcoming appointment in January  Diabetic Foot Exam: recommended yearly; will do at next in office appointment  Vaccinations: Influenza vaccine: completed 03/02/19 Pneumococcal vaccine: up to date; last 04/12/14 Tdap vaccine: up to date; last 12/03/16 Shingles vaccine: Please call your insurance company to determine your out of pocket expense for the Shingrix vaccine. You may receive this vaccine at your local pharmacy.  Advanced directives: Please bring a copy of your POA (Power of Attorney) and/or Living Will to your next appointment.  Goals: Recommend to drink at least 6-8 8oz glasses of water per day and consume a diet rich in fresh fruits and vegetables.   Next appointment: Please schedule your Annual Wellness Visit with your Nurse Health Advisor in one year.  Preventive Care 59 Years and Older, Male Preventive care refers to lifestyle choices and visits with your health care provider that can promote health and wellness. What does preventive care include?  A yearly physical exam. This is also called an annual well check.  Dental exams once or twice a year.  Routine eye exams. Ask your health care provider how often you should have your eyes checked.  Personal lifestyle choices, including:  Daily care of your teeth and gums.  Regular physical activity.  Eating a healthy diet.  Avoiding tobacco and drug  use.  Limiting alcohol use.  Practicing safe sex.  Taking low doses of aspirin every day if recommended by your health care provider..  Taking vitamin and mineral supplements as recommended by your health care provider. What happens during an annual well check? The services and screenings done by your health care provider during your annual well check will depend on your age, overall health, lifestyle risk factors, and family history of disease. Counseling  Your health care provider may ask you questions about your:  Alcohol use.  Tobacco use.  Drug use.  Emotional well-being.  Home and relationship well-being.  Sexual activity.  Eating habits.  History of falls.  Memory and ability to understand (cognition).  Work and work Statistician. Screening  You may have the following tests or measurements:  Height, weight, and BMI.  Blood pressure.  Lipid and cholesterol levels. These may be checked every 5 years, or more frequently if you are over 74 years old.  Skin check.  Lung cancer screening. You may have this screening every year starting at age 75 if you have a 30-pack-year history of smoking and currently smoke or have quit within the past 15 years.  Fecal occult blood test (FOBT) of the stool. You may have this test every year starting at age 64.  Flexible sigmoidoscopy or colonoscopy. You may have a sigmoidoscopy every 5 years or a colonoscopy every 10 years starting at age 18.  Prostate cancer screening. Recommendations will vary depending on your family history and other risks.  Hepatitis C blood test.  Hepatitis B blood test.  Sexually transmitted disease (STD) testing.  Diabetes screening. This is done  by checking your blood sugar (glucose) after you have not eaten for a while (fasting). You may have this done every 1-3 years.  Abdominal aortic aneurysm (AAA) screening. You may need this if you are a Salinas or former smoker.  Osteoporosis. You may  be screened starting at age 40 if you are at high risk. Talk with your health care provider about your test results, treatment options, and if necessary, the need for more tests. Vaccines  Your health care provider may recommend certain vaccines, such as:  Influenza vaccine. This is recommended every year.  Tetanus, diphtheria, and acellular pertussis (Tdap, Td) vaccine. You may need a Td booster every 10 years.  Zoster vaccine. You may need this after age 39.  Pneumococcal 13-valent conjugate (PCV13) vaccine. One dose is recommended after age 35.  Pneumococcal polysaccharide (PPSV23) vaccine. One dose is recommended after age 75. Talk to your health care provider about which screenings and vaccines you need and how often you need them. This information is not intended to replace advice given to you by your health care provider. Make sure you discuss any questions you have with your health care provider. Document Released: 06/17/2015 Document Revised: 02/08/2016 Document Reviewed: 03/22/2015 Elsevier Interactive Patient Education  2017 [employer] Prevention in the Home Falls can cause injuries. They can happen to people of all ages. There are many things you can do to make your home safe and to help prevent falls. What can I do on the outside of my home?  Regularly fix the edges of walkways and driveways and fix any cracks.  Remove anything that might make you trip as you walk through a door, such as a raised step or threshold.  Trim any bushes or trees on the path to your home.  Use bright outdoor lighting.  Clear any walking paths of anything that might make someone trip, such as rocks or tools.  Regularly check to see if handrails are loose or broken. Make sure that both sides of any steps have handrails.  Any raised decks and porches should have guardrails on the edges.  Have any leaves, snow, or ice cleared regularly.  Use sand or salt on walking paths during  winter.  Clean up any spills in your garage right away. This includes oil or grease spills. What can I do in the bathroom?  Use night lights.  Install grab bars by the toilet and in the tub and shower. Do not use towel bars as grab bars.  Use non-skid mats or decals in the tub or shower.  If you need to sit down in the shower, use a plastic, non-slip stool.  Keep the floor dry. Clean up any water that spills on the floor as soon as it happens.  Remove soap buildup in the tub or shower regularly.  Attach bath mats securely with double-sided non-slip rug tape.  Do not have throw rugs and other things on the floor that can make you trip. What can I do in the bedroom?  Use night lights.  Make sure that you have a light by your bed that is easy to reach.  Do not use any sheets or blankets that are too big for your bed. They should not hang down onto the floor.  Have a firm chair that has side arms. You can use this for support while you get dressed.  Do not have throw rugs and other things on the floor that can make you trip. What can  I do in the kitchen?  Clean up any spills right away.  Avoid walking on wet floors.  Keep items that you use a lot in easy-to-reach places.  If you need to reach something above you, use a strong step stool that has a grab bar.  Keep electrical cords out of the way.  Do not use floor polish or wax that makes floors slippery. If you must use wax, use non-skid floor wax.  Do not have throw rugs and other things on the floor that can make you trip. What can I do with my stairs?  Do not leave any items on the stairs.  Make sure that there are handrails on both sides of the stairs and use them. Fix handrails that are broken or loose. Make sure that handrails are as long as the stairways.  Check any carpeting to make sure that it is firmly attached to the stairs. Fix any carpet that is loose or worn.  Avoid having throw rugs at the top or  bottom of the stairs. If you do have throw rugs, attach them to the floor with carpet tape.  Make sure that you have a light switch at the top of the stairs and the bottom of the stairs. If you do not have them, ask someone to add them for you. What else can I do to help prevent falls?  Wear shoes that:  Do not have high heels.  Have rubber bottoms.  Are comfortable and fit you well.  Are closed at the toe. Do not wear sandals.  If you use a stepladder:  Make sure that it is fully opened. Do not climb a closed stepladder.  Make sure that both sides of the stepladder are locked into place.  Ask someone to hold it for you, if possible.  Clearly mark and make sure that you can see:  Any grab bars or handrails.  First and last steps.  Where the edge of each step is.  Use tools that help you move around (mobility aids) if they are needed. These include:  Canes.  Walkers.  Scooters.  Crutches.  Turn on the lights when you go into a dark area. Replace any light bulbs as soon as they burn out.  Set up your furniture so you have a clear path. Avoid moving your furniture around.  If any of your floors are uneven, fix them.  If there are any pets around you, be aware of where they are.  Review your medicines with your doctor. Some medicines can make you feel dizzy. This can increase your chance of falling. Ask your doctor what other things that you can do to help prevent falls. This information is not intended to replace advice given to you by your health care provider. Make sure you discuss any questions you have with your health care provider. Document Released: 03/17/2009 Document Revised: 10/27/2015 Document Reviewed: 06/25/2014 Elsevier Interactive Patient Education  2017 [employer].

## 2019-04-24 NOTE — Progress Notes (Signed)
I have personally reviewed the Medicare Annual Wellness Visit and agree with the assessment and plan.  [name]. [name], MD 04/24/2019 11:18 AM

## 2019-04-24 NOTE — Progress Notes (Signed)
This visit is being conducted via phone call due to the COVID-19 pandemic. This patient has given me verbal consent via phone to conduct this visit, patient states they are participating from their home address. Some vital signs may be absent or patient reported.   Patient identification: identified by name, DOB, and current address.  Location provider: [facility], Office Persons participating in the virtual visit: Denman George LPN and Dr. [name]   Subjective:   Justin Salinas is a 78 y.o. male who presents for Medicare Annual/Subsequent preventive examination.  Review of Systems:   Cardiac Risk Factors include: advanced age (>47mn, >>29women);male gender;diabetes mellitus;hypertension    Objective:    Vitals: BP 132/64   There is no height or weight on file to calculate BMI.  Advanced Directives 04/24/2019 03/29/2017 11/28/2016 04/15/2015 04/13/2015 05/20/2014 05/19/2014  Does Patient Have a Medical Advance Directive? Yes Yes Yes No Yes Yes Yes  Type of Advance Directive Living will;Healthcare Power of [place] - Living will Living will;Healthcare Power of Attorney Living will;Healthcare Power of Attorney  Does patient want to make changes to medical advance directive? No - Patient declined - - - Yes - information given No - Patient declined No - Patient declined  Copy of [place] Chart? No - copy requested - - - No - copy requested Yes Yes    Tobacco Social History   Tobacco Use  Smoking Status Former Smoker  . Types: Cigarettes  . Quit date: 06/04/1972  . Years since quitting: 46.9  Smokeless Tobacco Current User  . Types: Snuff  Tobacco Comment   quit about 40 years     Ready to quit: Not Answered Counseling given: Not Answered Comment: quit about 40 years   Clinical Intake:  Pre-visit preparation completed: Yes  Pain : No/denies pain  Diabetes: Yes(followed by endocrinology- Dr.  KDwyane Dee CBG done?: No Did pt. bring in CBG monitor from home?: No  How often do you need to have someone help you when you read instructions, pamphlets, or other written materials from your doctor or pharmacy?: 2 - Rarely  Interpreter Needed?: No  Information entered by :: CDenman GeorgeLPN  Past Medical History:  Diagnosis Date  . Allergy   . Anemia   . Asthma   . BPH (benign prostatic hyperplasia)   . CAD (coronary artery disease)    a. s/p MI 09/1993;  b. known CTO of RCA;  c. 06/2014 Cath: LM short/mod dzs, LAD 95ost, LCX nl, RI nl, RCA 100, EF 55%;  c. 06/2014 s/p CABG x 4: LIMA->LAD, VG->D1, VG->OM1, VG->Acute Marginal.  . Carotid stenosis    a. Carotid [place]  RICA 462-83% LICA 615-17%>> FU 1 year  . CKD (chronic kidney disease), stage III   . Constipation    in the past 2-3 weeks  . [place]  . Diastolic dysfunction    a. 05/2015 Echo: EF 60-65%, LVH, Gr 1 DD, mild MR, mod dil RA/LA, mod TR, PASP 443mg.  . Essential hypertension   . GERD (gastroesophageal reflux disease)   . Hyperlipidemia    Past Surgical History:  Procedure Laterality Date  . ANGIOPLASTY  09/22/1993   POBA of RCA (Dr. R.Marella Chimes . BACK SURGERY  2000   back fusion - Dr. HeEarnie Larsson. CARDIAC CATHETERIZATION  11/28/1998   "silent occlusion" of RCA w/collaterals from left coronary system, prox LAD  w/40-50% eccentric narrowing, 1st diagonal with 70-80% eccentric narrowing, 85% narrowing of prox small OM1 (Dr. Marella Chimes)  . CAROTID DOPPLER  07/2011   left subclavian (50-69%); right bulb (0-49%); RICA (normal); left mid-distal CCA (0-49%); left bulb/prox ICA (50-69%); left vertebral with abnormal antegrade flow  . CORONARY ARTERY BYPASS GRAFT N/A 05/21/2014   Procedure: CORONARY ARTERY BYPASS GRAFTING (CABG) x  four, using left internal mammary artery and right leg greater saphenous vein harvested endoscopically;  Surgeon: Melrose Nakayama, MD;  Location: [place];  Service: Open Heart  Surgery;  Laterality: N/A;  . INTRAOPERATIVE TRANSESOPHAGEAL ECHOCARDIOGRAM N/A 05/21/2014   Procedure: INTRAOPERATIVE TRANSESOPHAGEAL ECHOCARDIOGRAM;  Surgeon: Melrose Nakayama, MD;  Location: [place];  Service: Open Heart Surgery;  Laterality: N/A;  . LEFT HEART CATHETERIZATION WITH CORONARY ANGIOGRAM N/A 05/19/2014   Procedure: LEFT HEART CATHETERIZATION WITH CORONARY ANGIOGRAM;  Surgeon: Blane Ohara, MD;  Location: [facility] CATH LAB;  Service: Cardiovascular;  Laterality: N/A;  . NASAL SINUS SURGERY  2010  . NM MYOCAR PERF WALL MOTION  09/19/2009   bruce myoview - mild perfusion defect in basal inferior region (infarct/scar), EF 60%, low risk scan  . RENAL DOPPLER  10/2011   SMA w/ 70-99% diameter reduction & high grade stenosis; R&L renals w/narrowing and increased velocities (60-99%), R kidney smaller than L  . TRANSTHORACIC ECHOCARDIOGRAM  10/20/2012   EF 55-60%, moderate concentric hypertrophy, ventricular septum thickness increased, calcified MV annulus  . VASECTOMY  1963   Family History  Problem Relation Age of Onset  . Uterine cancer Mother   . Stroke Father   . Diabetes Sister   . Colon polyps Brother   . Heart disease Brother   . Colon cancer Neg Hx   . Esophageal cancer Neg Hx   . Rectal cancer Neg Hx   . Stomach cancer Neg Hx    Social History   Socioeconomic History  . Marital status: Widowed    Spouse name: Not on file  . Number of children: 2  . Years of education: Not on file  . Highest education level: Not on file  Occupational History  . Occupation: Retired   Scientific laboratory technician  . Financial resource strain: Not on file  . Food insecurity    Worry: Not on file    Inability: Not on file  . Transportation needs    Medical: Not on file    Non-medical: Not on file  Tobacco Use  . Smoking status: Former Smoker    Types: Cigarettes    Quit date: 06/04/1972    Years since quitting: 46.9  . Smokeless tobacco: Current User    Types: Snuff  . Tobacco comment: quit  about 40 years  Substance and Sexual Activity  . Alcohol use: Yes    Alcohol/week: 3.0 standard drinks    Types: 3 Standard drinks or equivalent per week    Comment: a glass of red wine 3 or 4 days a week.  . Drug use: No  . Sexual activity: Not on file  Lifestyle  . Physical activity    Days per week: Not on file    Minutes per session: Not on file  . Stress: Not on file  Relationships  . Social Herbalist on phone: Not on file    Gets together: Not on file    Attends religious service: Not on file    Active member of club or organization: Not on file    Attends meetings of  clubs or organizations: Not on file    Relationship status: Not on file  Other Topics Concern  . Not on file  Social History Narrative   Recently got puppy May 2020   Long-term girlfriend of 87 years died Dec 7096; complications from diabetes   Daughter lives nearby and assists as needed     Outpatient Encounter Medications as of 04/24/2019  Medication Sig  . acarbose (PRECOSE) 25 MG tablet TAKE 1 TABLET THREE TIMES DAILY WITH MEALS  . ACCU-CHEK GUIDE test strip TEST  BLOOD  SUGARS TWICE DAILY  . alfuzosin (UROXATRAL) 10 MG 24 hr tablet Take 10 mg by mouth daily with breakfast.  . amLODipine (NORVASC) 10 MG tablet TAKE 1 TABLET EVERY DAY (DOSE INCREASE)  . Ascorbic Acid (VITAMIN C) 1000 MG tablet Take 1,000 mg by mouth daily.  Marland Kitchen aspirin 81 MG tablet Take 81 mg by mouth daily.  . DROPLET PEN NEEDLES 31G X 5 MM MISC USE AS DIRECTED TWICE DAILY  . furosemide (LASIX) 20 MG tablet TAKE 1 TABLET DAILY AS NEEDED FOR SWELLING  . glucose blood (ACCU-CHEK AVIVA PLUS) test strip Check blood glucose twice daily Dx code E11.65  . glucose blood (ACCU-CHEK GUIDE) test strip Use to check blood sugars 2 times daily  . hydrALAZINE (APRESOLINE) 25 MG tablet Take 1 tablet (25 mg total) by mouth 3 (three) times daily.  . Insulin Aspart Prot & Aspart (INSULIN ASP PROT & ASP FLEXPEN) (70-30) 100 UNIT/ML SUPN INJECT  10 UNITS SUBCUTANEOUSLY  BEFORE BREAKFAST AND 6 UNITS BEFORE SUPPER (PEN EXPIRES 14 DAYS AFTER OPENING) (Patient taking differently: Taking 8 units in AM and 12 units in PM)  . Lancets Misc. (ACCU-CHEK SOFTCLIX LANCET DEV) KIT   . losartan (COZAAR) 100 MG tablet TAKE 1 TABLET EVERY DAY (DOSE INCREASE)  . Multiple Vitamins-Minerals (CENTRUM SILVER ADULT 50+ PO) Take by mouth.  . pioglitazone (ACTOS) 15 MG tablet Take 1 tablet (15 mg total) by mouth daily.  . simvastatin (ZOCOR) 20 MG tablet TAKE 1 TABLET EVERY EVENING  . TRUEplus Lancets 28G MISC USE TO TEST BLOOD SUGAR 2 TIMES DAILY  . chlorthalidone (HYGROTON) 25 MG tablet Take 1 tablet (25 mg total) by mouth daily.   No facility-administered encounter medications on file as of 04/24/2019.     Activities of Daily Living In your present state of health, do you have any difficulty performing the following activities: 04/24/2019  Hearing? Y  Vision? N  Difficulty concentrating or making decisions? N  Walking or climbing stairs? N  Dressing or bathing? N  Doing errands, shopping? N  Preparing Food and eating ? N  Using the Toilet? N  In the past six months, have you accidently leaked urine? N  Do you have problems with loss of bowel control? N  Managing your Medications? N  Managing your Finances? N  Housekeeping or managing your Housekeeping? N  Some recent data might be hidden    Patient Care Team: Vivi Barrack, MD as PCP - General (Family Medicine) [name], MD as PCP - Cardiology (Cardiology) Thornell Sartorius, MD as Consulting Physician (Otolaryngology) Elayne Snare, MD as Consulting Physician (Endocrinology) Drake Leach, [place] as Consulting Physician (Optometry) Corliss Parish, MD as Consulting Physician (Nephrology) Festus Aloe, MD as Consulting Physician (Urology)   Assessment:   This is a routine wellness examination for Organ.  Exercise Activities and Dietary recommendations Current Exercise  Habits: Home exercise routine, Type of exercise: walking, Time (Minutes): 45, Frequency (Times/Week): 4, Weekly  Exercise (Minutes/Week): 180, Intensity: Mild  Goals    . DIET - EAT MORE FRUITS AND VEGETABLES       Fall Risk Fall Risk  04/24/2019 03/05/2019 07/04/2017 03/29/2017  Falls in the past year? 0 0 No No  Injury with Fall? 0 - - -  Follow up Falls evaluation completed;Education provided;Falls prevention discussed - - -   Is the patient's home free of loose throw rugs in walkways, pet beds, electrical cords, etc?   yes      Grab bars in the bathroom? yes      Handrails on the stairs?   yes      Adequate lighting?   yes  Depression Screen PHQ 2/9 Scores 04/24/2019 04/22/2018 07/04/2017 03/29/2017  PHQ - 2 Score 0 0 0 1    Cognitive Function-no cognitive concerns at this time   6CIT Screen 04/24/2019  What Year? 0 points  What month? 0 points  What time? 0 points  Count back from 20 0 points  Months in reverse 0 points  Repeat phrase 0 points  Total Score 0    Immunization History  Administered Date(s) Administered  . Fluad Quad(high Dose 65+) 03/02/2019  . Influenza Whole 03/23/2009  . Influenza, High Dose Seasonal PF 02/02/2017, 02/17/2018  . Influenza-Unspecified 05/04/2003  . Pneumococcal Polysaccharide-23 03/23/2009    Qualifies for Shingles Vaccine? Discussed and patient will check with pharmacy for coverage.  Patient education handout provided   Screening Tests Health Maintenance  Topic Date Due  . OPHTHALMOLOGY EXAM  03/13/2018  . FOOT EXAM  02/18/2019  . HEMOGLOBIN A1C  07/24/2019  . COLONOSCOPY  12/11/2019  . TETANUS/TDAP  12/04/2026  . INFLUENZA VACCINE  Completed  . PNA vac Low Risk Adult  Completed   Cancer Screenings: Lung: Low Dose CT Chest recommended if Age 23-80 years, 30 pack-year currently smoking OR have quit w/in 15years. Patient does not qualify. Colorectal: colonoscopy 12/10/16 with Dr. Loletha Carrow       Plan:  I have personally  reviewed and addressed the Medicare Annual Wellness questionnaire and have noted the following in the patient's chart:  A. Medical and social history B. Use of alcohol, tobacco or illicit drugs  C. Current medications and supplements D. Functional ability and status E.  Nutritional status F.  Physical activity G. Advance directives H. List of other physicians I.  Hospitalizations, surgeries, and ER visits in previous 12 months J.  [place] such as hearing and vision if needed, cognitive and depression L. Referrals, records requested, and appointments- none   In addition, I have reviewed and discussed with patient certain preventive protocols, quality metrics, and best practice recommendations. A written personalized care plan for preventive services as well as general preventive health recommendations were provided to patient.   Signed,  Denman George, LPN  Nurse Health Advisor   Nurse Notes: no additional

## 2019-04-26 NOTE — Progress Notes (Signed)
Patient ID: Justin Salinas, male   DOB: Aug 22, 1940, 78 y.o.   MRN: [id number]           Reason for Appointment: Follow-up for Type 2 Diabetes    History of Present Illness:          Date of diagnosis of type 2 diabetes mellitus:2000        Background history:   His diabetes had been mild initially and treated with metformin He had subsequently other medications added including Amaryl and Januvia No details are available from PCP office, [name] in 2015 was 7.6  Because of his poor control was started on insulin on 01/03/17. He has been on Actos since 06/2017+ He had a baseline A1c A1c 9.7 on his initial consultation in 01/2017  Recent history:   Insulin regimen: NovoLog mix, 8 units at breakfast and 10 at supper  Acarbose 25 mg at breakfast and lunch daily  A1c in March was 7.5, now 6 and improving   Current management, blood sugar patterns and problems identified:  His insulin dose was reduced to 8 units in the evening because of symptoms of occasional low sugars at bedtime  Also he was told not to take any acarbose at dinnertime  Also his morning blood sugars occasionally tend to be low normal  Actos was stopped because of his symptoms of edema  However he still is having a tendency to edema and weight gain  He is walking about 2 miles every other day now and not able to go back to the gym  Not clear why his weight is gone up  He has occasionally eaten more snacks in the afternoon causing blood sugars to be higher, highest reading 210 recently  No hypoglycemic symptoms at any time  However overall blood sugars are improved in the afternoon  Side effects from medications have been: None  Compliance with the medical regimen: Good  Glucose monitoring:  done 2 times a day         Glucometer:  Accu-Chek guide me .      Blood Glucose readings by download of monitor:   PRE-MEAL Fasting Lunch Dinner Bedtime Overall  Glucose range:  79-106   73-210     Mean/median:  93   150   120   Previous readings:  PRE-MEAL Fasting Lunch Dinner Bedtime Overall  Glucose range:  74-105   155-296    Mean/median:     145     Self-care: The diet that the patient has been following is: tries to limit sweets, fried food       Typical meal intake: Breakfast is eggs meat, toast.  Lunch: Sandwich or.Crackers with or without peanut butter usually                Dietician visit, most recent: 03/2017   Weight history:   Wt Readings from Last 3 Encounters:  04/27/19 183 lb 6.4 oz (83.2 kg)  01/21/19 175 lb (79.4 kg)  10/22/18 170 lb (77.1 kg)    Glycemic control:   Lab Results  Component Value Date   HGBA1C 6.4 (A) 01/21/2019   HGBA1C 7.5 (H) 08/18/2018   HGBA1C 6.5 (A) 05/19/2018   Lab Results  Component Value Date   MICROALBUR <0.7 02/17/2018   LDLCALC 42 02/17/2018   CREATININE 2.0 (A) 12/22/2018   Lab Results  Component Value Date   MICRALBCREAT 1.3 02/17/2018   Microalbumin ratio 9 in 12/2015  Lab Results  Component Value Date  FRUCTOSAMINE 334 (H) 10/14/2018   FRUCTOSAMINE 351 (H) 02/14/2017      Allergies as of 04/27/2019      Reactions   Lasix [furosemide] Other (See Comments)   Constipation      Medication List       Accurate as of April 27, 2019  8:02 AM. If you have any questions, ask your nurse or doctor.        STOP taking these medications   pioglitazone 15 MG tablet Commonly known as: ACTOS Stopped by: Elayne Snare, MD     TAKE these medications   acarbose 25 MG tablet Commonly known as: PRECOSE TAKE 1 TABLET THREE TIMES DAILY WITH MEALS What changed: See the new instructions.   Accu-Chek Softclix Lancet Dev Kit   alfuzosin 10 MG 24 hr tablet Commonly known as: UROXATRAL Take 10 mg by mouth daily with breakfast.   amLODipine 10 MG tablet Commonly known as: NORVASC TAKE 1 TABLET EVERY DAY (DOSE INCREASE)   aspirin 81 MG tablet Take 81 mg by mouth daily.   CENTRUM SILVER ADULT 50+ PO  Take by mouth.   chlorthalidone 25 MG tablet Commonly known as: HYGROTON Take 1 tablet (25 mg total) by mouth daily.   Droplet Pen Needles 31G X 5 MM Misc Generic drug: Insulin Pen Needle USE AS DIRECTED TWICE DAILY   furosemide 20 MG tablet Commonly known as: LASIX TAKE 1 TABLET DAILY AS NEEDED FOR SWELLING   glucose blood test strip Commonly known as: Accu-Chek Aviva Plus Check blood glucose twice daily Dx code E11.65   glucose blood test strip Commonly known as: Accu-Chek Guide Use to check blood sugars 2 times daily   Accu-Chek Guide test strip Generic drug: glucose blood TEST  BLOOD  SUGARS TWICE DAILY   hydrALAZINE 25 MG tablet Commonly known as: APRESOLINE Take 1 tablet (25 mg total) by mouth 3 (three) times daily.   Insulin Asp Prot & Asp FlexPen (70-30) 100 UNIT/ML Supn Inject into the skin 2 (two) times daily. Inject 8 units under the skin at breakfast and 10 units at supper. What changed: Another medication with the same name was removed. Continue taking this medication, and follow the directions you see here. Changed by: Elayne Snare, MD   losartan 100 MG tablet Commonly known as: COZAAR TAKE 1 TABLET EVERY DAY (DOSE INCREASE)   simvastatin 20 MG tablet Commonly known as: ZOCOR TAKE 1 TABLET EVERY EVENING   TRUEplus Lancets 28G Misc USE TO TEST BLOOD SUGAR 2 TIMES DAILY   vitamin C 1000 MG tablet Take 1,000 mg by mouth daily.       Allergies:  Allergies  Allergen Reactions  . Lasix [Furosemide] Other (See Comments)    Constipation    Past Medical History:  Diagnosis Date  . Allergy   . Anemia   . Asthma   . BPH (benign prostatic hyperplasia)   . CAD (coronary artery disease)    a. s/p MI 09/1993;  b. known CTO of RCA;  c. 06/2014 Cath: LM short/mod dzs, LAD 95ost, LCX nl, RI nl, RCA 100, EF 55%;  c. 06/2014 s/p CABG x 4: LIMA->LAD, VG->D1, VG->OM1, VG->Acute Marginal.  . Carotid stenosis    a. Carotid US 3/53:  RICA 61-44%; LICA 31-54% >> FU  1 year  . CKD (chronic kidney disease), stage III   . Constipation    in the past 2-3 weeks  . Diabetes ([place])    AODM  . Diastolic dysfunction    a.  05/2015 Echo: EF 60-65%, LVH, Gr 1 DD, mild MR, mod dil RA/LA, mod TR, PASP 35mHg.  . Essential hypertension   . GERD (gastroesophageal reflux disease)   . Hyperlipidemia     Past Surgical History:  Procedure Laterality Date  . ANGIOPLASTY  09/22/1993   POBA of RCA (Dr. RMarella Chimes  . BACK SURGERY  2000   back fusion - Dr. HEarnie Larsson . CARDIAC CATHETERIZATION  11/28/1998   "silent occlusion" of RCA w/collaterals from left coronary system, prox LAD w/40-50% eccentric narrowing, 1st diagonal with 70-80% eccentric narrowing, 85% narrowing of prox small OM1 (Dr. RMarella Chimes  . CAROTID DOPPLER  07/2011   left subclavian (50-69%); right bulb (0-49%); RICA (normal); left mid-distal CCA (0-49%); left bulb/prox ICA (50-69%); left vertebral with abnormal antegrade flow  . CORONARY ARTERY BYPASS GRAFT N/A 05/21/2014   Procedure: CORONARY ARTERY BYPASS GRAFTING (CABG) x  four, using left internal mammary artery and right leg greater saphenous vein harvested endoscopically;  Surgeon: SMelrose Nakayama MD;  [facility]  Service: Open Heart Surgery;  Laterality: N/A;  . INTRAOPERATIVE TRANSESOPHAGEAL ECHOCARDIOGRAM N/A 05/21/2014   Procedure: INTRAOPERATIVE TRANSESOPHAGEAL ECHOCARDIOGRAM;  Surgeon: SMelrose Nakayama MD;  [facility]  Service: Open Heart Surgery;  Laterality: N/A;  . LEFT HEART CATHETERIZATION WITH CORONARY ANGIOGRAM N/A 05/19/2014   Procedure: LEFT HEART CATHETERIZATION WITH CORONARY ANGIOGRAM;  Surgeon: MBlane Ohara MD;  [facility];  Service: Cardiovascular;  Laterality: N/A;  . NASAL SINUS SURGERY  2010  . NM MYOCAR PERF WALL MOTION  09/19/2009   bruce myoview - mild perfusion defect in basal inferior region (infarct/scar), EF 60%, low risk scan  . RENAL DOPPLER  10/2011   SMA w/ 70-99% diameter  reduction & high grade stenosis; R&L renals w/narrowing and increased velocities (60-99%), R kidney smaller than L  . TRANSTHORACIC ECHOCARDIOGRAM  10/20/2012   EF 55-60%, moderate concentric hypertrophy, ventricular septum thickness increased, calcified MV annulus  . VASECTOMY  1963    Family History  Problem Relation Age of Onset  . Uterine cancer Mother   . Stroke Father   . Diabetes Sister   . Colon polyps Brother   . Heart disease Brother   . Colon cancer Neg Hx   . Esophageal cancer Neg Hx   . Rectal cancer Neg Hx   . Stomach cancer Neg Hx     Social History:  reports that he quit smoking about 46 years ago. His smoking use included cigarettes. His smokeless tobacco use includes snuff. He reports current alcohol use of about 3.0 standard drinks of alcohol per week. He reports that he does not use drugs.   Review of Systems   Lipid history: Taking simvastatin 40 mg, most recent LDL:  This is prescribed by cardiologist    Lab Results  Component Value Date   CHOL 107 02/17/2018   HDL 52.40 02/17/2018   LDLCALC 42 02/17/2018   LDLDIRECT 45.0 04/17/2017   TRIG 59.0 02/17/2018   CHOLHDL 2 02/17/2018           Hypertension: Blood pressure is controlled with adding hydralazine and he is also on Lasix and chlorthalidone now    BP Readings from Last 3 Encounters:  04/27/19 (!) 160/60  04/24/19 132/64  03/05/19 (!) 155/69   Renal function has been variable and again relatively worse He has been followed by nephrologist Ultrasound showed reduced right kidney size  Also on Lasix for edema However he has had more  edema lately  Lab Results  Component Value Date   CREATININE 2.0 (A) 12/22/2018   CREATININE 2.17 (H) 10/14/2018   CREATININE 2.02 (H) 08/18/2018    HYPERKALEMIA: He is not following a low potassium diet even though he apparently has been given a list of foods to avoid, had a banana today Last potassium was significantly high Due to follow-up with  nephrologist   Lab Results  Component Value Date   K 5.9 (A) 12/22/2018    Most recent eye exam was 10/19  Most recent foot exam: 9/19   LABS:  No visits with results within 1 Week(s) from this visit.  Latest known visit with results is:  Office Visit on 01/21/2019  Component Date Value Ref Range Status  . Hemoglobin A1C 01/21/2019 6.4* 4.0 - 5.6 % Final    Physical Examination:  BP (!) 160/60 (BP Location: Left Arm, Patient Position: Sitting, Cuff Size: Normal)   Pulse 60   Ht _0  (1.778 m)   Wt 183 lb 6.4 oz (83.2 kg)   SpO2 99%   BMI 26.32 kg/m      ASSESSMENT:  Diabetes type 2, insulin requiring  See history of present illness for detailed discussion of current diabetes management, blood sugar patterns and problems identified  [name] previously had gone up to 7.5 and now back to 6.0  He is on twice a day premixed insulin along with acarbose at breakfast and lunch  [name] indicates better control Likely is doing better with because of more consistent exercise Also usually trying to watch his diet His weight gain is unexplained and may be partly related to edema Currently still requiring relatively low doses of insulin and this may be also because of his renal failure [name]  Leg edema: This is likely to be from his renal dysfunction and venous stasis  HYPERTENSION and renal dysfunction: The blood pressure is better at home but is higher today He needs to follow-up with nephrologist  HYPERKALEMIA, again reminded him to watch high potassium foods and follow-up with nephrologist  Lipids: He will have labs done today   PLAN:   He will continue same doses of insulin twice daily More sugars after meals Labs to be checked today for chemistry panel and lipids He will discuss his problems with edema with his various physicians as well as adjustment of his blood pressure medication Consultation with dietitian since he has various active medical  problems that need attention   There are no Patient Instructions on file for this visit.   Elayne Snare 04/27/2019, 8:02 AM   Note: This office note was prepared with Dragon voice recognition system technology. Any transcriptional errors that result from this process are unintentional.

## 2019-04-27 ENCOUNTER — Ambulatory Visit (INDEPENDENT_AMBULATORY_CARE_PROVIDER_SITE_OTHER): Payer: Medicare HMO | Admitting: Endocrinology

## 2019-04-27 ENCOUNTER — Other Ambulatory Visit: Payer: Self-pay

## 2019-04-27 ENCOUNTER — Encounter: Payer: Self-pay | Admitting: Endocrinology

## 2019-04-27 VITALS — BP 160/60 | HR 60 | Ht 70.0 in | Wt 183.4 lb

## 2019-04-27 DIAGNOSIS — E875 Hyperkalemia: Secondary | ICD-10-CM | POA: Diagnosis not present

## 2019-04-27 DIAGNOSIS — E1165 Type 2 diabetes mellitus with hyperglycemia: Secondary | ICD-10-CM | POA: Diagnosis not present

## 2019-04-27 DIAGNOSIS — Z794 Long term (current) use of insulin: Secondary | ICD-10-CM

## 2019-04-27 DIAGNOSIS — E782 Mixed hyperlipidemia: Secondary | ICD-10-CM | POA: Diagnosis not present

## 2019-04-27 LAB — POCT GLYCOSYLATED HEMOGLOBIN (HGB A1C): Hemoglobin A1C: 6 % — AB (ref 4.0–5.6)

## 2019-04-27 LAB — COMPREHENSIVE METABOLIC PANEL
ALT: 28 U/L (ref 0–53)
AST: 24 U/L (ref 0–37)
Albumin: 3.8 g/dL (ref 3.5–5.2)
Alkaline Phosphatase: 76 U/L (ref 39–117)
BUN: 29 mg/dL — ABNORMAL HIGH (ref 6–23)
CO2: 26 mEq/L (ref 19–32)
Calcium: 9 mg/dL (ref 8.4–10.5)
Chloride: 105 mEq/L (ref 96–112)
Creatinine, Ser: 1.7 mg/dL — ABNORMAL HIGH (ref 0.40–1.50)
GFR: 38.9 mL/min — ABNORMAL LOW (ref >60.00)
Glucose, Bld: 150 mg/dL — ABNORMAL HIGH (ref 70–99)
Potassium: 4.6 mEq/L (ref 3.5–5.1)
Sodium: 138 mEq/L (ref 135–145)
Total Bilirubin: 0.5 mg/dL (ref 0.2–1.2)
Total Protein: 6.7 g/dL (ref 6.0–8.3)

## 2019-04-27 LAB — URINALYSIS, ROUTINE W REFLEX MICROSCOPIC
Bilirubin Urine: NEGATIVE
Hgb urine dipstick: NEGATIVE
Ketones, ur: NEGATIVE
Leukocytes,Ua: NEGATIVE
Nitrite: NEGATIVE
RBC / HPF: NONE SEEN (ref 0–?)
Specific Gravity, Urine: 1.02 (ref 1.000–1.030)
Total Protein, Urine: NEGATIVE
Urine Glucose: NEGATIVE
Urobilinogen, UA: 0.2 (ref 0.0–1.0)
pH: 6 (ref 5.0–8.0)

## 2019-04-27 LAB — LIPID PANEL
Cholesterol: 96 mg/dL (ref 0–200)
HDL: 55.3 mg/dL (ref >39.00)
LDL Cholesterol: 33 mg/dL (ref 0–99)
NonHDL: 40.89
Total CHOL/HDL Ratio: 2
Triglycerides: 40 mg/dL (ref 0.0–149.0)
VLDL: 8 mg/dL (ref 0.0–40.0)

## 2019-04-27 LAB — MICROALBUMIN / CREATININE URINE RATIO
Creatinine,U: 118.8 mg/dL
Microalb Creat Ratio: 0.6 mg/g (ref 0.0–30.0)
Microalb, Ur: <0.7 mg/dL (ref 0.0–1.9)

## 2019-04-27 NOTE — Progress Notes (Signed)
Please call to let patient know that the lab results are good, kidney function looks better, potassium okay

## 2019-04-27 NOTE — Patient Instructions (Addendum)
Check blood sugars on waking up 3-4 days a week  Also check blood sugars about 2 hours after meals and do this after different meals by rotation  Recommended blood sugar levels on waking up are 90-130 and about 2 hours after meal is 130-160  Please bring your blood sugar monitor to each visit, thank you  No change in insulin  Low potassium diet

## 2019-04-28 DIAGNOSIS — H918X3 Other specified hearing loss, bilateral: Secondary | ICD-10-CM | POA: Diagnosis not present

## 2019-04-28 DIAGNOSIS — Z87891 Personal history of nicotine dependence: Secondary | ICD-10-CM | POA: Diagnosis not present

## 2019-04-28 DIAGNOSIS — I10 Essential (primary) hypertension: Secondary | ICD-10-CM | POA: Diagnosis not present

## 2019-04-28 DIAGNOSIS — W429XXS Exposure to other noise, sequela: Secondary | ICD-10-CM | POA: Diagnosis not present

## 2019-04-28 DIAGNOSIS — I6523 Occlusion and stenosis of bilateral carotid arteries: Secondary | ICD-10-CM | POA: Diagnosis not present

## 2019-04-28 DIAGNOSIS — I129 Hypertensive chronic kidney disease with stage 1 through stage 4 chronic kidney disease, or unspecified chronic kidney disease: Secondary | ICD-10-CM | POA: Diagnosis not present

## 2019-04-28 DIAGNOSIS — E1122 Type 2 diabetes mellitus with diabetic chronic kidney disease: Secondary | ICD-10-CM | POA: Diagnosis not present

## 2019-04-28 DIAGNOSIS — H832X1 Labyrinthine dysfunction, right ear: Secondary | ICD-10-CM | POA: Diagnosis not present

## 2019-04-28 DIAGNOSIS — E119 Type 2 diabetes mellitus without complications: Secondary | ICD-10-CM | POA: Diagnosis not present

## 2019-04-28 DIAGNOSIS — I25709 Atherosclerosis of coronary artery bypass graft(s), unspecified, with unspecified angina pectoris: Secondary | ICD-10-CM | POA: Diagnosis not present

## 2019-04-28 DIAGNOSIS — I2510 Atherosclerotic heart disease of native coronary artery without angina pectoris: Secondary | ICD-10-CM | POA: Diagnosis not present

## 2019-04-28 DIAGNOSIS — H903 Sensorineural hearing loss, bilateral: Secondary | ICD-10-CM | POA: Diagnosis not present

## 2019-04-28 DIAGNOSIS — N183 Chronic kidney disease, stage 3 unspecified: Secondary | ICD-10-CM | POA: Diagnosis not present

## 2019-04-28 DIAGNOSIS — D649 Anemia, unspecified: Secondary | ICD-10-CM | POA: Diagnosis not present

## 2019-04-28 LAB — FRUCTOSAMINE: Fructosamine: 321 umol/L — ABNORMAL HIGH (ref 0–285)

## 2019-05-01 ENCOUNTER — Other Ambulatory Visit: Payer: Self-pay | Admitting: Family Medicine

## 2019-05-08 DIAGNOSIS — Z9621 Cochlear implant status: Secondary | ICD-10-CM | POA: Diagnosis not present

## 2019-05-08 DIAGNOSIS — Z79899 Other long term (current) drug therapy: Secondary | ICD-10-CM | POA: Diagnosis not present

## 2019-05-08 DIAGNOSIS — H90A32 Mixed conductive and sensorineural hearing loss, unilateral, left ear with restricted hearing on the contralateral side: Secondary | ICD-10-CM | POA: Diagnosis not present

## 2019-05-08 DIAGNOSIS — Z48810 Encounter for surgical aftercare following surgery on the sense organs: Secondary | ICD-10-CM | POA: Diagnosis not present

## 2019-05-09 DIAGNOSIS — H90A32 Mixed conductive and sensorineural hearing loss, unilateral, left ear with restricted hearing on the contralateral side: Secondary | ICD-10-CM | POA: Insufficient documentation

## 2019-05-09 DIAGNOSIS — T8579XA Infection and inflammatory reaction due to other internal prosthetic devices, implants and grafts, initial encounter: Secondary | ICD-10-CM | POA: Insufficient documentation

## 2019-05-09 DIAGNOSIS — Z9621 Cochlear implant status: Secondary | ICD-10-CM | POA: Insufficient documentation

## 2019-05-15 ENCOUNTER — Other Ambulatory Visit: Payer: Self-pay | Admitting: Endocrinology

## 2019-05-15 ENCOUNTER — Other Ambulatory Visit: Payer: Self-pay | Admitting: Cardiovascular Disease

## 2019-06-03 ENCOUNTER — Other Ambulatory Visit: Payer: Self-pay | Admitting: Cardiovascular Disease

## 2019-06-03 DIAGNOSIS — E1165 Type 2 diabetes mellitus with hyperglycemia: Secondary | ICD-10-CM

## 2019-06-26 DIAGNOSIS — E1136 Type 2 diabetes mellitus with diabetic cataract: Secondary | ICD-10-CM | POA: Diagnosis not present

## 2019-06-26 DIAGNOSIS — Z794 Long term (current) use of insulin: Secondary | ICD-10-CM | POA: Diagnosis not present

## 2019-06-26 DIAGNOSIS — H2513 Age-related nuclear cataract, bilateral: Secondary | ICD-10-CM | POA: Diagnosis not present

## 2019-06-26 LAB — HM DIABETES EYE EXAM

## 2019-06-29 ENCOUNTER — Encounter: Payer: Self-pay | Admitting: Cardiovascular Disease

## 2019-06-29 ENCOUNTER — Other Ambulatory Visit: Payer: Self-pay

## 2019-06-29 ENCOUNTER — Ambulatory Visit (INDEPENDENT_AMBULATORY_CARE_PROVIDER_SITE_OTHER): Payer: Medicare HMO | Admitting: Cardiovascular Disease

## 2019-06-29 VITALS — BP 162/60 | HR 66 | Ht 70.0 in | Wt 186.8 lb

## 2019-06-29 DIAGNOSIS — I6523 Occlusion and stenosis of bilateral carotid arteries: Secondary | ICD-10-CM

## 2019-06-29 DIAGNOSIS — I2510 Atherosclerotic heart disease of native coronary artery without angina pectoris: Secondary | ICD-10-CM

## 2019-06-29 DIAGNOSIS — E782 Mixed hyperlipidemia: Secondary | ICD-10-CM | POA: Diagnosis not present

## 2019-06-29 DIAGNOSIS — I10 Essential (primary) hypertension: Secondary | ICD-10-CM

## 2019-06-29 MED ORDER — HYDRALAZINE HCL 50 MG PO TABS: 50.0000 mg | ORAL_TABLET | Freq: Three times a day (TID) | ORAL | 3 refills | Status: DC

## 2019-06-29 NOTE — Patient Instructions (Addendum)
Medication Instructions:  1) INCREASE HYDRALAZINE to 50 mg three times daily *If you need a refill on your cardiac medications before your next appointment, please call your pharmacy*   Follow-Up: You have an appointment in the [facility] on Monday, August 03, 2019 at 8:30AM.  At [facility], you and your health needs are our priority.  As part of our continuing mission to provide you with exceptional heart care, we have created designated Provider Care Teams.  These Care Teams include your primary Cardiologist (physician) and Advanced Practice Providers (APPs -  Physician Assistants and Nurse Practitioners) who all work together to provide you with the care you need, when you need it. Your next appointment with Dr. Burt Knack:   12 month(s) The format for your next appointment:   In Person Provider:   You may see [name], MD or one of the following Advanced Practice Providers on your designated Care Team:    Richardson Dopp, PA-C  Vin [place], [place]  Daune Perch, [place]

## 2019-06-29 NOTE — Progress Notes (Signed)
Cardiology Office Note:    Date:  06/29/2019   ID:  Bethann Punches Alers, DOB March 03, 1941, MRN [id number]  PCP:  Vivi Barrack, MD  Cardiologist:  [name], MD  Electrophysiologist:  None   Referring MD: Vivi Barrack, MD   Chief Complaint  Patient presents with  . Coronary Artery Disease    History of Present Illness:    Justin Salinas is a 79 y.o. male with a hx of coronary artery disease status post remote PCI in the 1990s and multivessel CABG in 2015.  Other medical problems include hypertension, hyperlipidemia, type 2 diabetes, and chronic kidney disease.  The patient is here alone today. He's been doing fairly well. He denies chest pain or pressure. He has chronic leg edema unchanged over the past year. He wears compression stockings. Now following with [facility] as well. Denies dyspnea, orthopnea, PND, or palpitations. Home BP is running too high. He brings in readings today. These demonstrate systolic hypertension with average readings 150's/60's.   Past Medical History:  Diagnosis Date  . Allergy   . Anemia   . Asthma   . BPH (benign prostatic hyperplasia)   . CAD (coronary artery disease)    a. s/p MI 09/1993;  b. known CTO of RCA;  c. 06/2014 Cath: LM short/mod dzs, LAD 95ost, LCX nl, RI nl, RCA 100, EF 55%;  c. 06/2014 s/p CABG x 4: LIMA->LAD, VG->D1, VG->OM1, VG->Acute Marginal.  . Carotid stenosis    a. Carotid US 9/16:  RICA 94-50%; LICA 38-88% >> FU 1 year  . CKD (chronic kidney disease), stage III   . Constipation    in the past 2-3 weeks  . Diabetes ([place])    AODM  . Diastolic dysfunction    a. 05/2015 Echo: EF 60-65%, LVH, Gr 1 DD, mild MR, mod dil RA/LA, mod TR, PASP 73mHg.  . Essential hypertension   . GERD (gastroesophageal reflux disease)   . Hyperlipidemia     Past Surgical History:  Procedure Laterality Date  . ANGIOPLASTY  09/22/1993   POBA of RCA (Dr. RMarella Chimes  . BACK SURGERY  2000   back fusion - Dr. HEarnie Larsson . CARDIAC  CATHETERIZATION  11/28/1998   "silent occlusion" of RCA w/collaterals from left coronary system, prox LAD w/40-50% eccentric narrowing, 1st diagonal with 70-80% eccentric narrowing, 85% narrowing of prox small OM1 (Dr. RMarella Chimes  . CAROTID DOPPLER  07/2011   left subclavian (50-69%); right bulb (0-49%); RICA (normal); left mid-distal CCA (0-49%); left bulb/prox ICA (50-69%); left vertebral with abnormal antegrade flow  . CORONARY ARTERY BYPASS GRAFT N/A 05/21/2014   Procedure: CORONARY ARTERY BYPASS GRAFTING (CABG) x  four, using left internal mammary artery and right leg greater saphenous vein harvested endoscopically;  Surgeon: SMelrose Nakayama MD;  [place] [facility]  Service: Open Heart Surgery;  Laterality: N/A;  . INTRAOPERATIVE TRANSESOPHAGEAL ECHOCARDIOGRAM N/A 05/21/2014   Procedure: INTRAOPERATIVE TRANSESOPHAGEAL ECHOCARDIOGRAM;  Surgeon: SMelrose Nakayama MD;  [place] [facility]  Service: Open Heart Surgery;  Laterality: N/A;  . LEFT HEART CATHETERIZATION WITH CORONARY ANGIOGRAM N/A 05/19/2014   Procedure: LEFT HEART CATHETERIZATION WITH CORONARY ANGIOGRAM;  Surgeon: MBlane Ohara MD;  [facility];  Service: Cardiovascular;  Laterality: N/A;  . NASAL SINUS SURGERY  2010  . NM MYOCAR PERF WALL MOTION  09/19/2009   bruce myoview - mild perfusion defect in basal inferior region (infarct/scar), EF 60%, low risk scan  . RENAL DOPPLER  10/2011  SMA w/ 70-99% diameter reduction & high grade stenosis; R&L renals w/narrowing and increased velocities (60-99%), R kidney smaller than L  . TRANSTHORACIC ECHOCARDIOGRAM  10/20/2012   EF 55-60%, moderate concentric hypertrophy, ventricular septum thickness increased, calcified MV annulus  . VASECTOMY  1963    Current Medications: Current Meds  Medication Sig  . acarbose (PRECOSE) 25 MG tablet TAKE 1 TABLET THREE TIMES DAILY WITH MEALS  . ACCU-CHEK GUIDE test strip TEST  BLOOD  SUGARS TWICE DAILY  . alfuzosin (UROXATRAL) 10 MG  24 hr tablet Take 10 mg by mouth daily with breakfast.  . amLODipine (NORVASC) 10 MG tablet Take 1 tablet (10 mg total) by mouth daily. Pt must keep appt in Jan, 2021 for future refills  . Ascorbic Acid (VITAMIN C) 1000 MG tablet Take 1,000 mg by mouth daily.  Marland Kitchen aspirin 81 MG tablet Take 81 mg by mouth daily.  . DROPLET PEN NEEDLES 31G X 5 MM MISC USE AS DIRECTED TWICE DAILY  . furosemide (LASIX) 20 MG tablet Take 1 tablet (20 mg total) by mouth as needed (Swelling). Please keep upcoming appt for future refills. Thank you  . glucose blood (ACCU-CHEK AVIVA PLUS) test strip Check blood glucose twice daily Dx code E11.65  . glucose blood (ACCU-CHEK GUIDE) test strip Use to check blood sugars 2 times daily  . hydrALAZINE (APRESOLINE) 50 MG tablet Take 1 tablet (50 mg total) by mouth 3 (three) times daily.  . Insulin Aspart Prot & Aspart (INSULIN ASP PROT & ASP FLEXPEN) (70-30) 100 UNIT/ML SUPN Inject into the skin 2 (two) times daily. Inject 8 units under the skin at breakfast and 10 units at supper.  . Lancets Misc. (ACCU-CHEK SOFTCLIX LANCET DEV) KIT   . losartan (COZAAR) 100 MG tablet Take 1 tablet (100 mg total) by mouth daily. Pt must keep appt in Jan, 2021 for future refills  . Multiple Vitamins-Minerals (CENTRUM SILVER ADULT 50+ PO) Take by mouth.  . simvastatin (ZOCOR) 20 MG tablet Take 1 tablet (20 mg total) by mouth every evening. Pt must keep appt in Jan, 2021 for future refills  . TRUEplus Lancets 28G MISC USE TO TEST BLOOD SUGAR 2 TIMES DAILY  . [DISCONTINUED] hydrALAZINE (APRESOLINE) 25 MG tablet Take 1 tablet (25 mg total) by mouth 3 (three) times daily.     Allergies:   Lasix [furosemide]   Social History   Socioeconomic History  . Marital status: Widowed    Spouse name: Not on file  . Number of children: 2  . Years of education: Not on file  . Highest education level: Not on file  Occupational History  . Occupation: Retired   Tobacco Use  . Smoking status: Former Smoker     Types: Cigarettes    Quit date: 06/04/1972    Years since quitting: 47.0  . Smokeless tobacco: Current User    Types: Snuff  . Tobacco comment: quit about 40 years  Substance and Sexual Activity  . Alcohol use: Yes    Alcohol/week: 3.0 standard drinks    Types: 3 Standard drinks or equivalent per week    Comment: a glass of red wine 3 or 4 days a week.  . Drug use: No  . Sexual activity: Not on file  Other Topics Concern  . Not on file  Social History Narrative   Recently got puppy May 2020   Long-term girlfriend of 9 years died Dec 1660; complications from diabetes   Daughter lives nearby and assists as needed  Social Determinants of Health   Financial Resource Strain:   . Difficulty of Paying Living Expenses: Not on file  Food Insecurity:   . Worried About Charity fundraiser in the Last Year: Not on file  . Ran Out of Food in the Last Year: Not on file  Transportation Needs:   . Lack of Transportation (Medical): Not on file  . Lack of Transportation (Non-Medical): Not on file  Physical Activity:   . Days of Exercise per Week: Not on file  . Minutes of Exercise per Session: Not on file  Stress:   . Feeling of Stress : Not on file  Social Connections:   . Frequency of Communication with Friends and Family: Not on file  . Frequency of Social Gatherings with Friends and Family: Not on file  . Attends Religious Services: Not on file  . Active Member of Clubs or Organizations: Not on file  . Attends Archivist Meetings: Not on file  . Marital Status: Not on file     Family History: The patient's family history includes Colon polyps in his brother; Diabetes in his sister; Heart disease in his brother; Stroke in his father; Uterine cancer in his mother. There is no history of Colon cancer, Esophageal cancer, Rectal cancer, or Stomach cancer.  ROS:   Please see the history of present illness.    All other systems reviewed and are negative.  EKGs/Labs/Other  Studies Reviewed:    The following studies were reviewed today: Carotid US 12-02-2017: Final Interpretation: Right Carotid: Velocities in the right ICA are consistent with a 1-39% stenosis.                Non-hemodynamically significant plaque <50% noted in the CCA. The                ECA appears >50% stenosed.  Left Carotid: Velocities in the left ICA are consistent with a 40-59% stenosis               (high end of range), based on plaque and peak sytolic velocity.               Non-hemodynamically significant plaque noted in the CCA. The ECA               appears >50% stenosed.  Vertebrals:  Right vertebral artery demonstrates antegrade flow. Aberrant flow              noted in the left vertebral artery. Subclavians: Normal flow hemodynamics were seen in bilateral subclavian              arteries.  EKG:  EKG is ordered today.  The ekg ordered today demonstrates NSR, age-indeterminate septal infarct, nonspecific T wave abnormality  Recent Labs: 12/22/2018: Hemoglobin 11.8 04/27/2019: ALT 28; BUN 29; Creatinine, Ser 1.70; Potassium 4.6; Sodium 138  Recent Lipid Panel    Component Value Date/Time   CHOL 96 04/27/2019 0830   TRIG 40.0 04/27/2019 0830   HDL 55.30 04/27/2019 0830   CHOLHDL 2 04/27/2019 0830   VLDL 8.0 04/27/2019 0830   LDLCALC 33 04/27/2019 0830   LDLDIRECT 45.0 04/17/2017 0914    Physical Exam:    VS:  BP (!) 162/60   Pulse 66   Ht _0  (1.778 m)   Wt 186 lb 12.8 oz (84.7 kg)   SpO2 97%   BMI 26.80 kg/m     Wt Readings from Last 3 Encounters:  06/29/19 186 lb 12.8 oz (  84.7 kg)  04/27/19 183 lb 6.4 oz (83.2 kg)  01/21/19 175 lb (79.4 kg)     GEN:  Well nourished, well developed in no acute distress HEENT: Normal NECK: No JVD; BL carotid bruits LYMPHATICS: No lymphadenopathy CARDIAC: RRR, no murmurs, rubs, gallops RESPIRATORY:  Clear to auscultation without rales, wheezing or rhonchi  ABDOMEN: Soft, non-tender, non-distended MUSCULOSKELETAL:  1+  bilateral pretibial edema; No deformity  SKIN: Warm and dry NEUROLOGIC:  Alert and oriented x 3 PSYCHIATRIC:  Normal affect   ASSESSMENT:    1. Coronary artery disease involving native coronary artery of native heart without angina pectoris   2. Mixed hyperlipidemia   3. Essential hypertension   4. Bilateral carotid artery stenosis    PLAN:    In order of problems listed above:  1. Stable without angina.  Medical program is reviewed and no changes are recommended today regarding his antianginal therapy.  He continues on amlodipine, aspirin, and a statin drug. 2. Treated with simvastatin.  Recent labs demonstrate a total cholesterol of 96, HDL 55, LDL 33. 3. Blood pressure control is suboptimal.  Reviewed medical program.  Recommended increase hydralazine to 50 mg 3 times daily. Follow-up Hypertension Clinic 1 month. 4. Prominent bruits likely secondary to ECA stenosis.  Most recent Doppler study reviewed.  No stroke/TIA symptoms.  We will continue current therapy.  Medication Adjustments/Labs and Tests Ordered: Current medicines are reviewed at length with the patient today.  Concerns regarding medicines are outlined above.  No orders of the defined types were placed in this encounter.  Meds ordered this encounter  Medications  . hydrALAZINE (APRESOLINE) 50 MG tablet    Sig: Take 1 tablet (50 mg total) by mouth 3 (three) times daily.    Dispense:  270 tablet    Refill:  3    Dose increase    Patient Instructions  Medication Instructions:  1) INCREASE HYDRALAZINE to 50 mg three times daily *If you need a refill on your cardiac medications before your next appointment, please call your pharmacy*   Follow-Up: You have an appointment in the [facility] on Monday, August 03, 2019 at 8:30AM.  At [facility], you and your health needs are our priority.  As part of our continuing mission to provide you with exceptional heart care, we have created designated Provider Care Teams.   These Care Teams include your primary Cardiologist (physician) and Advanced Practice Providers (APPs -  Physician Assistants and Nurse Practitioners) who all work together to provide you with the care you need, when you need it. Your next appointment with Dr. Burt Knack:   12 month(s) The format for your next appointment:   In Person Provider:   You may see [name], MD or one of the following Advanced Practice Providers on your designated Care Team:    Richardson Dopp, PA-C  Vin [place], PA-C  Daune Perch, [place]     Signed, [name], MD  06/29/2019 1:55 PM    [place]

## 2019-07-05 DIAGNOSIS — Z20828 Contact with and (suspected) exposure to other viral communicable diseases: Secondary | ICD-10-CM | POA: Diagnosis not present

## 2019-07-08 NOTE — Addendum Note (Signed)
Addended by: Georgiann Cocker on: 07/08/2019 04:09 PM   Modules accepted: Orders

## 2019-07-14 DIAGNOSIS — N400 Enlarged prostate without lower urinary tract symptoms: Secondary | ICD-10-CM | POA: Diagnosis not present

## 2019-07-14 DIAGNOSIS — I129 Hypertensive chronic kidney disease with stage 1 through stage 4 chronic kidney disease, or unspecified chronic kidney disease: Secondary | ICD-10-CM | POA: Diagnosis not present

## 2019-07-14 DIAGNOSIS — E1122 Type 2 diabetes mellitus with diabetic chronic kidney disease: Secondary | ICD-10-CM | POA: Diagnosis not present

## 2019-07-14 DIAGNOSIS — N183 Chronic kidney disease, stage 3 unspecified: Secondary | ICD-10-CM | POA: Diagnosis not present

## 2019-07-14 DIAGNOSIS — D631 Anemia in chronic kidney disease: Secondary | ICD-10-CM | POA: Diagnosis not present

## 2019-07-20 ENCOUNTER — Other Ambulatory Visit: Payer: Self-pay | Admitting: Endocrinology

## 2019-07-20 ENCOUNTER — Other Ambulatory Visit: Payer: Self-pay

## 2019-07-20 ENCOUNTER — Other Ambulatory Visit (INDEPENDENT_AMBULATORY_CARE_PROVIDER_SITE_OTHER): Payer: Medicare HMO

## 2019-07-20 DIAGNOSIS — Z794 Long term (current) use of insulin: Secondary | ICD-10-CM | POA: Diagnosis not present

## 2019-07-20 DIAGNOSIS — E1165 Type 2 diabetes mellitus with hyperglycemia: Secondary | ICD-10-CM | POA: Diagnosis not present

## 2019-07-20 LAB — COMPREHENSIVE METABOLIC PANEL
ALT: 17 U/L (ref 0–53)
AST: 20 U/L (ref 0–37)
Albumin: 3.8 g/dL (ref 3.5–5.2)
Alkaline Phosphatase: 76 U/L (ref 39–117)
BUN: 49 mg/dL — ABNORMAL HIGH (ref 6–23)
CO2: 28 mEq/L (ref 19–32)
Calcium: 8.7 mg/dL (ref 8.4–10.5)
Chloride: 100 mEq/L (ref 96–112)
Creatinine, Ser: 2.06 mg/dL — ABNORMAL HIGH (ref 0.40–1.50)
GFR: 31.15 mL/min — ABNORMAL LOW (ref >60.00)
Glucose, Bld: 279 mg/dL — ABNORMAL HIGH (ref 70–99)
Potassium: 4.7 mEq/L (ref 3.5–5.1)
Sodium: 135 mEq/L (ref 135–145)
Total Bilirubin: 0.5 mg/dL (ref 0.2–1.2)
Total Protein: 6.5 g/dL (ref 6.0–8.3)

## 2019-07-20 LAB — HEMOGLOBIN A1C: Hgb A1c MFr Bld: 7.3 % — ABNORMAL HIGH (ref 4.6–6.5)

## 2019-07-22 ENCOUNTER — Other Ambulatory Visit (HOSPITAL_COMMUNITY): Payer: Self-pay | Admitting: *Deleted

## 2019-07-23 ENCOUNTER — Inpatient Hospital Stay (HOSPITAL_COMMUNITY): Admission: RE | Admit: 2019-07-23 | Payer: Medicare HMO | Source: Ambulatory Visit

## 2019-07-23 ENCOUNTER — Other Ambulatory Visit: Payer: Self-pay

## 2019-07-24 ENCOUNTER — Inpatient Hospital Stay (HOSPITAL_COMMUNITY): Admission: RE | Admit: 2019-07-24 | Payer: Medicare HMO | Source: Ambulatory Visit

## 2019-07-26 ENCOUNTER — Ambulatory Visit: Payer: Medicare HMO | Attending: Internal Medicine

## 2019-07-26 DIAGNOSIS — Z23 Encounter for immunization: Secondary | ICD-10-CM | POA: Insufficient documentation

## 2019-07-26 NOTE — Progress Notes (Signed)
Patient ID: Justin Salinas, male   DOB: Sep 26, 1940, 79 y.o.   MRN: [id number]           Reason for Appointment: Follow-up for Type 2 Diabetes    History of Present Illness:          Date of diagnosis of type 2 diabetes mellitus:2000        Background history:   His diabetes had been mild initially and treated with metformin He had subsequently other medications added including Amaryl and Januvia No details are available from PCP office, [name] in 2015 was 7.6  Because of his poor control was started on insulin on 01/03/17. He has been on Actos since 06/2017+ He had a baseline A1c A1c 9.7 on his initial consultation in 01/2017  Recent history:   Insulin regimen: NovoLog mix, 8 units at breakfast and 10 at supper  Acarbose 25 mg at breakfast and lunch daily  A1c in March 2020 was 7.5, now back up to 7.3 even though it had been as low as 6%   Current management, blood sugar patterns and problems identified:  His insulin dose was no change on his last visit  For some reason his blood sugars are increasing significantly when he checks them at dinnertime  Also his lab glucose was 279 after breakfast  He does not think he misses any insulin doses  At least his blood sugars may be higher before the meeting cookies and other snacks  Also not exercising, previously was walking 2 miles every other day  He has been on acarbose for some time and continues to take it 3 times a day  However sometimes he will take this after eating instead of before  He has occasionally eaten more snacks in the afternoon causing blood sugars to be higher, highest reading 210 recently  No hypoglycemic symptoms reported  His weight fluctuate based on his edema  Side effects from medications have been: None  Compliance with the medical regimen: Good  Glucose monitoring:  done 2 times a day         Glucometer:  Accu-Chek guide me .      Blood Glucose readings by download of  monitor:   PRE-MEAL Fasting Lunch Dinner Bedtime Overall  Glucose range:  85-134   93-337    Mean/median: 105   195   149   Previous readings:  PRE-MEAL Fasting Lunch Dinner Bedtime Overall  Glucose range:  79-106   73-210    Mean/median:  93   150   120    Self-care: The diet that the patient has been following is: tries to limit sweets, fried food       Typical meal intake: Breakfast is eggs,  Oatmeal/ meat, toast.  Lunch: Sandwich or.Crackers with or without peanut butter usually                Dietician visit, most recent: 03/2017   Weight history:   Wt Readings from Last 3 Encounters:  07/27/19 176 lb 3.2 oz (79.9 kg)  06/29/19 186 lb 12.8 oz (84.7 kg)  04/27/19 183 lb 6.4 oz (83.2 kg)    Glycemic control:   Lab Results  Component Value Date   HGBA1C 7.3 (H) 07/20/2019   HGBA1C 6.0 (A) 04/27/2019   HGBA1C 6.4 (A) 01/21/2019   Lab Results  Component Value Date   MICROALBUR <0.7 04/27/2019   LDLCALC 33 04/27/2019   CREATININE 2.06 (H) 07/20/2019   Lab Results  Component  Value Date   MICRALBCREAT 0.6 04/27/2019   Microalbumin ratio 9 in 12/2015  Lab Results  Component Value Date   FRUCTOSAMINE 321 (H) 04/27/2019   FRUCTOSAMINE 334 (H) 10/14/2018   FRUCTOSAMINE 351 (H) 02/14/2017      Allergies as of 07/27/2019      Reactions   Lasix [furosemide] Other (See Comments)   Constipation      Medication List       Accurate as of July 27, 2019  9:44 AM. If you have any questions, ask your nurse or doctor.        STOP taking these medications   acarbose 25 MG tablet Commonly known as: PRECOSE Stopped by: Elayne Snare, MD     TAKE these medications   Accu-Chek Softclix Lancet Dev Kit   alfuzosin 10 MG 24 hr tablet Commonly known as: UROXATRAL Take 10 mg by mouth daily with breakfast.   amLODipine 10 MG tablet Commonly known as: NORVASC Take 1 tablet (10 mg total) by mouth daily. Pt must keep appt in Jan, 2021 for future refills   aspirin  81 MG tablet Take 81 mg by mouth daily.   CENTRUM SILVER ADULT 50+ PO Take by mouth.   chlorthalidone 25 MG tablet Commonly known as: HYGROTON Take 1 tablet (25 mg total) by mouth daily.   Droplet Pen Needles 31G X 5 MM Misc Generic drug: Insulin Pen Needle USE AS DIRECTED TWICE DAILY   furosemide 20 MG tablet Commonly known as: LASIX Take 1 tablet (20 mg total) by mouth as needed (Swelling). Please keep upcoming appt for future refills. Thank you   glucose blood test strip Commonly known as: Accu-Chek Guide Use to check blood sugars 2 times daily What changed: Another medication with the same name was removed. Continue taking this medication, and follow the directions you see here. Changed by: Elayne Snare, MD   Accu-Chek Guide test strip Generic drug: glucose blood TEST  BLOOD  SUGARS TWICE DAILY What changed: Another medication with the same name was removed. Continue taking this medication, and follow the directions you see here. Changed by: Elayne Snare, MD   hydrALAZINE 50 MG tablet Commonly known as: APRESOLINE Take 1 tablet (50 mg total) by mouth 3 (three) times daily.   Insulin Asp Prot & Asp FlexPen (70-30) 100 UNIT/ML Supn Inject into the skin 2 (two) times daily. Inject 8 units under the skin at breakfast and 10 units at supper.   losartan 100 MG tablet Commonly known as: COZAAR Take 1 tablet (100 mg total) by mouth daily. Pt must keep appt in Jan, 2021 for future refills   repaglinide 1 MG tablet Commonly known as: Prandin Take 1 tablet (1 mg total) by mouth 2 (two) times daily before a meal. Started by: Elayne Snare, MD   simvastatin 20 MG tablet Commonly known as: ZOCOR Take 1 tablet (20 mg total) by mouth every evening. Pt must keep appt in Jan, 2021 for future refills   TRUEplus Lancets 28G Misc USE TO TEST BLOOD SUGAR 2 TIMES DAILY   vitamin C 1000 MG tablet Take 1,000 mg by mouth daily.       Allergies:  Allergies  Allergen Reactions  . Lasix  [Furosemide] Other (See Comments)    Constipation    Past Medical History:  Diagnosis Date  . Allergy   . Anemia   . Asthma   . BPH (benign prostatic hyperplasia)   . CAD (coronary artery disease)    a. s/p MI  09/1993;  b. known CTO of RCA;  c. 06/2014 Cath: LM short/mod dzs, LAD 95ost, LCX nl, RI nl, RCA 100, EF 55%;  c. 06/2014 s/p CABG x 4: LIMA->LAD, VG->D1, VG->OM1, VG->Acute Marginal.  . Carotid stenosis    a. Carotid US 3/22:  RICA 02-54%; LICA 27-06% >> FU 1 year  . CKD (chronic kidney disease), stage III   . Constipation    in the past 2-3 weeks  . Diabetes ([place])    AODM  . Diastolic dysfunction    a. 05/2015 Echo: EF 60-65%, LVH, Gr 1 DD, mild MR, mod dil RA/LA, mod TR, PASP 53mHg.  . Essential hypertension   . GERD (gastroesophageal reflux disease)   . Hyperlipidemia     Past Surgical History:  Procedure Laterality Date  . ANGIOPLASTY  09/22/1993   POBA of RCA (Dr. RMarella Chimes  . BACK SURGERY  2000   back fusion - Dr. HEarnie Larsson . CARDIAC CATHETERIZATION  11/28/1998   "silent occlusion" of RCA w/collaterals from left coronary system, prox LAD w/40-50% eccentric narrowing, 1st diagonal with 70-80% eccentric narrowing, 85% narrowing of prox small OM1 (Dr. RMarella Chimes  . CAROTID DOPPLER  07/2011   left subclavian (50-69%); right bulb (0-49%); RICA (normal); left mid-distal CCA (0-49%); left bulb/prox ICA (50-69%); left vertebral with abnormal antegrade flow  . CORONARY ARTERY BYPASS GRAFT N/A 05/21/2014   Procedure: CORONARY ARTERY BYPASS GRAFTING (CABG) x  four, using left internal mammary artery and right leg greater saphenous vein harvested endoscopically;  Surgeon: SMelrose Nakayama MD;  [facility]  Service: Open Heart Surgery;  Laterality: N/A;  . INTRAOPERATIVE TRANSESOPHAGEAL ECHOCARDIOGRAM N/A 05/21/2014   Procedure: INTRAOPERATIVE TRANSESOPHAGEAL ECHOCARDIOGRAM;  Surgeon: SMelrose Nakayama MD;  [facility]  Service: Open Heart Surgery;   Laterality: N/A;  . LEFT HEART CATHETERIZATION WITH CORONARY ANGIOGRAM N/A 05/19/2014   Procedure: LEFT HEART CATHETERIZATION WITH CORONARY ANGIOGRAM;  Surgeon: MBlane Ohara MD;  [facility];  Service: Cardiovascular;  Laterality: N/A;  . NASAL SINUS SURGERY  2010  . NM MYOCAR PERF WALL MOTION  09/19/2009   bruce myoview - mild perfusion defect in basal inferior region (infarct/scar), EF 60%, low risk scan  . RENAL DOPPLER  10/2011   SMA w/ 70-99% diameter reduction & high grade stenosis; R&L renals w/narrowing and increased velocities (60-99%), R kidney smaller than L  . TRANSTHORACIC ECHOCARDIOGRAM  10/20/2012   EF 55-60%, moderate concentric hypertrophy, ventricular septum thickness increased, calcified MV annulus  . VASECTOMY  1963    Family History  Problem Relation Age of Onset  . Uterine cancer Mother   . Stroke Father   . Diabetes Sister   . Colon polyps Brother   . Heart disease Brother   . Colon cancer Neg Hx   . Esophageal cancer Neg Hx   . Rectal cancer Neg Hx   . Stomach cancer Neg Hx     Social History:  reports that he quit smoking about 47 years ago. His smoking use included cigarettes. His smokeless tobacco use includes snuff. He reports current alcohol use of about 3.0 standard drinks of alcohol per week. He reports that he does not use drugs.   Review of Systems   Lipid history: Taking simvastatin 40 mg, most recent LDL:  This is prescribed by cardiologist    Lab Results  Component Value Date   CHOL 96 04/27/2019   HDL 55.30 04/27/2019   LDLCALC 33 04/27/2019   LDLDIRECT 45.0  04/17/2017   TRIG 40.0 04/27/2019   CHOLHDL 2 04/27/2019           Hypertension: Blood pressure is controlled with adding hydralazine and he is also on Lasix and chlorthalidone    BP Readings from Last 3 Encounters:  07/27/19 140/60  06/29/19 (!) 162/60  04/27/19 (!) 160/60   Renal function has been variable and again relatively worse He has been followed by  nephrologist Ultrasound showed reduced right kidney size  Takes Lasix for edema which is better now  Lab Results  Component Value Date   CREATININE 2.06 (H) 07/20/2019   CREATININE 1.70 (H) 04/27/2019   CREATININE 2.0 (A) 12/22/2018    HYPERKALEMIA: Recently controlled Has regular follow-up with nephrologist   Lab Results  Component Value Date   K 4.7 07/20/2019    Most recent eye exam was 10/19  Most recent foot exam: 9/19   LABS:  No visits with results within 1 Week(s) from this visit.  Latest known visit with results is:  Lab on 07/20/2019  Component Date Value Ref Range Status  . Sodium 07/20/2019 135  135 - 145 mEq/L Final  . Potassium 07/20/2019 4.7  3.5 - 5.1 mEq/L Final  . Chloride 07/20/2019 100  96 - 112 mEq/L Final  . CO2 07/20/2019 28  19 - 32 mEq/L Final  . Glucose, Bld 07/20/2019 279* 70 - 99 mg/dL Final  . BUN 07/20/2019 49* 6 - 23 mg/dL Final  . Creatinine, Ser 07/20/2019 2.06* 0.40 - 1.50 mg/dL Final  . Total Bilirubin 07/20/2019 0.5  0.2 - 1.2 mg/dL Final  . Alkaline Phosphatase 07/20/2019 76  39 - 117 U/L Final  . AST 07/20/2019 20  0 - 37 U/L Final  . ALT 07/20/2019 17  0 - 53 U/L Final  . Total Protein 07/20/2019 6.5  6.0 - 8.3 g/dL Final  . Albumin 07/20/2019 3.8  3.5 - 5.2 g/dL Final  . GFR 07/20/2019 31.15* >60.00 mL/min Final  . Calcium 07/20/2019 8.7  8.4 - 10.5 mg/dL Final  . Hgb A1c MFr Bld 07/20/2019 7.3* 4.6 - 6.5 % Final   Glycemic Control Guidelines for People with Diabetes:Non Diabetic:  <6%Goal of Therapy: <7%Additional Action Suggested:  >8%     Physical Examination:  BP 140/60 (BP Location: Left Arm, Patient Position: Sitting, Cuff Size: Normal)   Pulse 70   Ht _0  (1.778 m)   Wt 176 lb 3.2 oz (79.9 kg)   SpO2 97%   BMI 25.28 kg/m      ASSESSMENT:  Diabetes type 2, insulin requiring  See history of present illness for detailed discussion of current diabetes management, blood sugar patterns and problems  identified  [name] is 7.3  He is on twice a day premixed insulin along with acarbose at mealtimes  [name] indicates poor control This is likely to be from eating more sweets and snacks Also likely had done better with exercise in the last visit Although he is only taking small doses of insulin his blood sugars have been as high as 337 but are very inconsistent before dinnertime This is also partly because he will sometimes eat carbohydrates like sandwiches at lunchtime and otherwise may only have some snacks As usual he forgets to check his sugars after meals despite reminders [name]   HYPERTENSION and renal dysfunction: The blood pressure is better today He needs to follow-up with nephrologist as creatinine is slightly higher  HYPERKALEMIA, now controlled    PLAN:  He does need to start improving his diet and he thinks he can cut back on sweets like cookies Encouraged him to check his sugars 9-10 AM or 9-10 PM instead of before breakfast and dinnertime and written instructions given Since acarbose is likely ineffective and he has renal failure we will switch to Prandin at breakfast and dinnertime Although he may benefit from Humalog 50/50 this is not preferred on his insurance  He will now increase his morning dose to at least 10 units and add another 5 units at lunchtime unless he is only eating a snack, this will cover his sandwich As a precaution reduce evening dose to 8 units We will need to review his blood sugar patterns on the next visit in about 2 months and A1c will be done for the following visit He will need to call us if he has any tendency to hypoglycemia  Resume exercise when able to   Patient Instructions  Stop Acabose  New diabetes tablet will be repaglinide 1 mg to be taken before starting to eat breakfast and dinner  INCREASE insulin before breakfast to 10 units, reduce insulin at dinnertime to 8 units If you are eating a sandwich at lunch you  will be taking 5 units of insulin before lunch also  However let us know if you are having any low sugars  Start walking when able to  Instead of checking blood sugars on waking up/dinnertime check blood sugars about 9-10 AM in the morning and 9-10 pm at night  Cut back on sweets like cookies    Elayne Snare 07/27/2019, 9:44 AM   Note: This office note was prepared with Dragon voice recognition system technology. Any transcriptional errors that result from this process are unintentional.

## 2019-07-26 NOTE — Progress Notes (Signed)
   Covid-19 Vaccination Clinic  Name:  Justin Salinas    MRN: [id number] DOB: May 20, 1941  07/26/2019  [name] was observed post Covid-19 immunization for 15 minutes without incidence. He was provided with Vaccine Information Sheet and instruction to access the V-Safe system.   [name] was instructed to call 911 with any severe reactions post vaccine: Marland Kitchen Difficulty breathing  . Swelling of your face and throat  . A fast heartbeat  . A bad rash all over your body  . Dizziness and weakness    Immunizations Administered    Name Date Dose VIS Date Route   Pfizer COVID-19 Vaccine 07/26/2019  8:26 AM 0.3 mL 05/15/2019 Intramuscular   Manufacturer: [place]   Lot: [id number]   [place]: [address]-8

## 2019-07-27 ENCOUNTER — Ambulatory Visit: Payer: Medicare HMO | Admitting: Endocrinology

## 2019-07-27 ENCOUNTER — Encounter: Payer: Self-pay | Admitting: Endocrinology

## 2019-07-27 ENCOUNTER — Other Ambulatory Visit: Payer: Self-pay

## 2019-07-27 VITALS — BP 140/60 | HR 70 | Ht 70.0 in | Wt 176.2 lb

## 2019-07-27 DIAGNOSIS — Z794 Long term (current) use of insulin: Secondary | ICD-10-CM

## 2019-07-27 DIAGNOSIS — E1165 Type 2 diabetes mellitus with hyperglycemia: Secondary | ICD-10-CM

## 2019-07-27 DIAGNOSIS — N184 Chronic kidney disease, stage 4 (severe): Secondary | ICD-10-CM

## 2019-07-27 MED ORDER — REPAGLINIDE 1 MG PO TABS: 1.0000 mg | ORAL_TABLET | Freq: Two times a day (BID) | ORAL | 2 refills | Status: DC

## 2019-07-27 NOTE — Patient Instructions (Signed)
Stop Acabose  New diabetes tablet will be repaglinide 1 mg to be taken before starting to eat breakfast and dinner  INCREASE insulin before breakfast to 10 units, reduce insulin at dinnertime to 8 units If you are eating a sandwich at lunch you will be taking 5 units of insulin before lunch also  However let us know if you are having any low sugars  Start walking when able to  Instead of checking blood sugars on waking up/dinnertime check blood sugars about 9-10 AM in the morning and 9-10 pm at night  Cut back on sweets like cookies

## 2019-07-28 DIAGNOSIS — I129 Hypertensive chronic kidney disease with stage 1 through stage 4 chronic kidney disease, or unspecified chronic kidney disease: Secondary | ICD-10-CM | POA: Diagnosis not present

## 2019-07-29 ENCOUNTER — Other Ambulatory Visit: Payer: Self-pay

## 2019-07-29 ENCOUNTER — Ambulatory Visit (HOSPITAL_COMMUNITY)
Admission: RE | Admit: 2019-07-29 | Discharge: 2019-07-29 | Disposition: A | Discharge status: Home / Self Care | Payer: Medicare HMO | Source: Ambulatory Visit | Attending: Nephrology | Admitting: Nephrology

## 2019-07-29 DIAGNOSIS — N189 Chronic kidney disease, unspecified: Secondary | ICD-10-CM | POA: Diagnosis not present

## 2019-07-29 DIAGNOSIS — D631 Anemia in chronic kidney disease: Secondary | ICD-10-CM | POA: Insufficient documentation

## 2019-07-29 MED ORDER — SODIUM CHLORIDE 0.9 % IV SOLN
510.0000 mg | INTRAVENOUS | Status: DC
  Administered 2019-07-29: 10:00:00 510 mg via INTRAVENOUS
  Filled 2019-07-29: qty 17

## 2019-07-29 NOTE — Discharge Instructions (Signed)

## 2019-08-03 ENCOUNTER — Other Ambulatory Visit: Payer: Self-pay

## 2019-08-03 ENCOUNTER — Ambulatory Visit (INDEPENDENT_AMBULATORY_CARE_PROVIDER_SITE_OTHER): Payer: Medicare HMO | Admitting: Pharmacist

## 2019-08-03 VITALS — BP 130/56 | HR 66

## 2019-08-03 DIAGNOSIS — E1159 Type 2 diabetes mellitus with other circulatory complications: Secondary | ICD-10-CM | POA: Diagnosis not present

## 2019-08-03 DIAGNOSIS — I152 Hypertension secondary to endocrine disorders: Secondary | ICD-10-CM

## 2019-08-03 DIAGNOSIS — I10 Essential (primary) hypertension: Secondary | ICD-10-CM | POA: Diagnosis not present

## 2019-08-03 NOTE — Progress Notes (Signed)
Patient ID: Justin Salinas                 DOB: 04-Mar-1941                      MRN: [id number]     HPI: Justin Salinas is a pleasant 79 y.o. male referred by Dr. Burt Knack to HTN clinic. PMH is significant for CAD s/p remote PCI in the 1990s and multivessel CABG in 2015, HTN, HLD, DM2, and CKD. Pt has previously followed in the HTN clinic in early 2020 with improvement in BP from 184/68 to 142/68 with dose titrations of amlodipine and losartan. When spironolactone 12.5 mg was initiated, K increased to 5.2, so spironolactone was switched to chlorthalidone '25mg'$  daily. Most recently, pt was started on hydralazine. BP home cuff is 81 years old and previously measured 10 mmHg higher than the clinic cuff. Most recently, he was seen by Dr Burt Knack in clinic on 06/29/19 and BP was again elevated at 162/60. Home readings were also elevated in the 150s/60s. Hydralazine was increased to '50mg'$  TID and pt presents today for follow up.  Patient presents today in good spirits. He recently became a great grandfather 2 weeks ago. He reports tolerating his medications well and denies dizziness, balance problems, headache, or blurred vision. He brings in all of his home medications today - noted differences from our medication list include pt taking simvastatin '40mg'$  instead of '20mg'$ , taking higher scheduled dose of Lasix '20mg'$  TID per his nephrologist Dr Vista Mink, and stopping chlorthalidone. He reports swelling in his legs has improved. Wears compression stockings occasionally.  He has monitored his BP at home, ranging 129/54 to 167/69, most readings 130-150/70s. HR mid 60s. He has been checking his BP right when he wakes up, just before taking his AM meds. Does not routinely sit for 5-10 minutes before checking his pressure. Of note, he continues to use a blood pressure cuff he has had for about 15 years. This was previously brought to clinic and read ~7mHg higher than clinic reading.  He stays active working on 2  acres of land at home. Continues to work out at [employer] in the morning and is trying to watch his sodium. Reports eating some salty popcorn recently.  Current HTN meds:  amlodipine '10mg'$  daily (PM) losartan '100mg'$  daily (AM) chlorthalidone 25 mg daily (AM) - d/c'ed by nephrologist hydralazine '50mg'$  TID (6am, 12pm, 5pm) furosemide '20mg'$  prn swelling - increased to '20mg'$  TID by nephrologist  Previously tried: spironolactone - hyperkalemia with K to 5.2  BP goal: <130/871mg  Family History: Father with history of stroke, sister with history of diabetes.  Social History: Former smoker, quit in 1974, drinks red wine 3-4 days a week, denies illicit drug use.  Diet:  Breakfast - eggs, sausage or oatmeal Lunch - snacks on peanut butter crackers, rice cakes, peanuts Dinner - K and W Does not add salt to food, uses Mrs DaDeliah Boston Muscoy1 cup of decaf coffee and occasionally tea  Exercise: Goes to the gym 3-4 days a week for at least an hour each time and uses the machines, weights, walks, and stair stepper machine. 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  Home BP readings: Home readings- 135/57, 140/61, 150/63, 145/66, 155/70, HR mid 60slow of 129/54, high of 167/69  Wt Readings from Last 3 Encounters:  07/29/19 176 lb (79.8 kg)  07/27/19 176 lb 3.2 oz (79.9  kg)  06/29/19 186 lb 12.8 oz (84.7 kg)   BP Readings from Last 3 Encounters:  07/29/19 (!) 138/43  07/27/19 140/60  06/29/19 (!) 162/60   Pulse Readings from Last 3 Encounters:  07/29/19 (!) 53  07/27/19 70  06/29/19 66    Renal function: Estimated Creatinine Clearance: 29.5 mL/min (A) (by C-G formula based on SCr of 2.06 mg/dL (H)).  Past Medical History:  Diagnosis Date  . Allergy   . Anemia   . Asthma   . BPH (benign prostatic hyperplasia)   . CAD (coronary artery disease)    a. s/p MI 09/1993;  b. known CTO of RCA;  c. 06/2014 Cath: LM short/mod dzs, LAD 95ost, LCX nl, RI nl, RCA 100, EF  55%;  c. 06/2014 s/p CABG x 4: LIMA->LAD, VG->D1, VG->OM1, VG->Acute Marginal.  . Carotid stenosis    a. Carotid US 9/93:  RICA 57-01%; LICA 77-93% >> FU 1 year  . CKD (chronic kidney disease), stage III   . Constipation    in the past 2-3 weeks  . Diabetes ([place])    AODM  . Diastolic dysfunction    a. 05/2015 Echo: EF 60-65%, LVH, Gr 1 DD, mild MR, mod dil RA/LA, mod TR, PASP 97mHg.  . Essential hypertension   . GERD (gastroesophageal reflux disease)   . Hyperlipidemia     Current Outpatient Medications on File Prior to Visit  Medication Sig Dispense Refill  . ACCU-CHEK GUIDE test strip TEST  BLOOD  SUGARS TWICE DAILY 200 strip 3  . alfuzosin (UROXATRAL) 10 MG 24 hr tablet Take 10 mg by mouth daily with breakfast.    . amLODipine (NORVASC) 10 MG tablet Take 1 tablet (10 mg total) by mouth daily. Pt must keep appt in Jan, 2021 for future refills 60 tablet 0  . Ascorbic Acid (VITAMIN C) 1000 MG tablet Take 1,000 mg by mouth daily.    .Marland Kitchenaspirin 81 MG tablet Take 81 mg by mouth daily.    . chlorthalidone (HYGROTON) 25 MG tablet Take 1 tablet (25 mg total) by mouth daily. 30 tablet 11  . DROPLET PEN NEEDLES 31G X 5 MM MISC USE AS DIRECTED TWICE DAILY 200 each 2  . furosemide (LASIX) 20 MG tablet Take 1 tablet (20 mg total) by mouth as needed (Swelling). Please keep upcoming appt for future refills. Thank you 90 tablet 0  . glucose blood (ACCU-CHEK GUIDE) test strip Use to check blood sugars 2 times daily 200 each 1  . hydrALAZINE (APRESOLINE) 50 MG tablet Take 1 tablet (50 mg total) by mouth 3 (three) times daily. 270 tablet 3  . Insulin Aspart Prot & Aspart (INSULIN ASP PROT & ASP FLEXPEN) (70-30) 100 UNIT/ML SUPN Inject into the skin 2 (two) times daily. Inject 8 units under the skin at breakfast and 10 units at supper.    . Lancets Misc. (ACCU-CHEK SOFTCLIX LANCET DEV) KIT     . losartan (COZAAR) 100 MG tablet Take 1 tablet (100 mg total) by mouth daily. Pt must keep appt in Jan, 2021 for  future refills 60 tablet 0  . Multiple Vitamins-Minerals (CENTRUM SILVER ADULT 50+ PO) Take by mouth.    . repaglinide (PRANDIN) 1 MG tablet Take 1 tablet (1 mg total) by mouth 2 (two) times daily before a meal. 60 tablet 2  . simvastatin (ZOCOR) 20 MG tablet Take 1 tablet (20 mg total) by mouth every evening. Pt must keep appt in Jan, 2021 for future refills 60 tablet  0  . TRUEplus Lancets 28G MISC USE TO TEST BLOOD SUGAR 2 TIMES DAILY 200 each 1   No current facility-administered medications on file prior to visit.    Allergies  Allergen Reactions  . Lasix [Furosemide] Other (See Comments)    Constipation     Assessment/Plan:  1. Hypertension - BP has improved and is now at goal < 130/60mHg. Will continue current meds including amlodipine '10mg'$  daily, losartan '100mg'$  daily, hydralazine '50mg'$  TID, and furosemide '20mg'$  TID. Pt encouraged to continue with '000mg'$  sodium intake and regular physical activity. Pt encouraged to continue monitoring his BP at home 2 hours after taking medications and after sitting for 5- 10 minutes. F/u in pharmacy clinic as needed.  Levii Hairfield E. Froylan Hobby, PharmD, BCACP, [place] [place] [address]Phone: [phone] Fax: [phone]/06/2019 7:36 AM

## 2019-08-03 NOTE — Patient Instructions (Addendum)
It was nice to see you today!  Your blood pressure is at your goal of less than 130/18mmHg, keep up the great work!  Continue taking: -Amlodipine 10mg  once daily (evening) -Losartan 100mg  once daily (morning) -Hydralazine 50mg  3 times a day -Furosemide 20mg  3 times a day  Continue to monitor your blood pressure at home. Check your blood pressure at least a few hours after taking your morning medications. Make sure you have been sitting for at least 5-10 minutes before checking your pressure.  Call [facility] with any blood pressure concerns [phone]

## 2019-08-05 ENCOUNTER — Other Ambulatory Visit: Payer: Self-pay

## 2019-08-05 ENCOUNTER — Ambulatory Visit (HOSPITAL_COMMUNITY)
Admission: RE | Admit: 2019-08-05 | Discharge: 2019-08-05 | Disposition: A | Discharge status: Home / Self Care | Payer: Medicare HMO | Source: Ambulatory Visit | Attending: Nephrology | Admitting: Nephrology

## 2019-08-05 DIAGNOSIS — N189 Chronic kidney disease, unspecified: Secondary | ICD-10-CM | POA: Insufficient documentation

## 2019-08-05 DIAGNOSIS — D631 Anemia in chronic kidney disease: Secondary | ICD-10-CM | POA: Diagnosis not present

## 2019-08-05 MED ORDER — SODIUM CHLORIDE 0.9 % IV SOLN
510.0000 mg | INTRAVENOUS | Status: DC
  Administered 2019-08-05: 510 mg via INTRAVENOUS
  Filled 2019-08-05: qty 17

## 2019-08-12 ENCOUNTER — Other Ambulatory Visit: Payer: Self-pay | Admitting: Cardiovascular Disease

## 2019-08-12 ENCOUNTER — Other Ambulatory Visit: Payer: Self-pay | Admitting: Family Medicine

## 2019-08-12 ENCOUNTER — Other Ambulatory Visit: Payer: Self-pay | Admitting: Endocrinology

## 2019-08-13 IMAGING — US US RENAL
1 series · 14 of 25 positions shown · non-contrast
Comparison: CT from 04/13/2015

CLINICAL DATA: Chronic renal disease

EXAM:
RENAL / URINARY TRACT ULTRASOUND COMPLETE

[Series 1: us renal · 0.23mm/px · 14 of 49 slices shown]
[im 1/49]
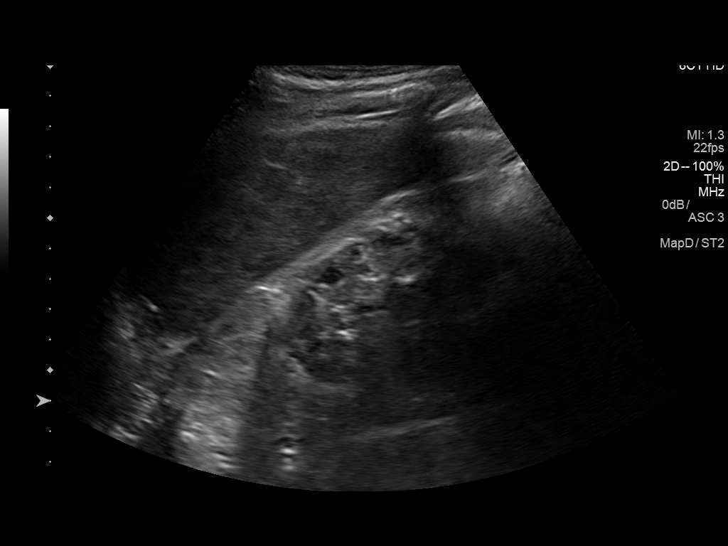
[im 5/49]
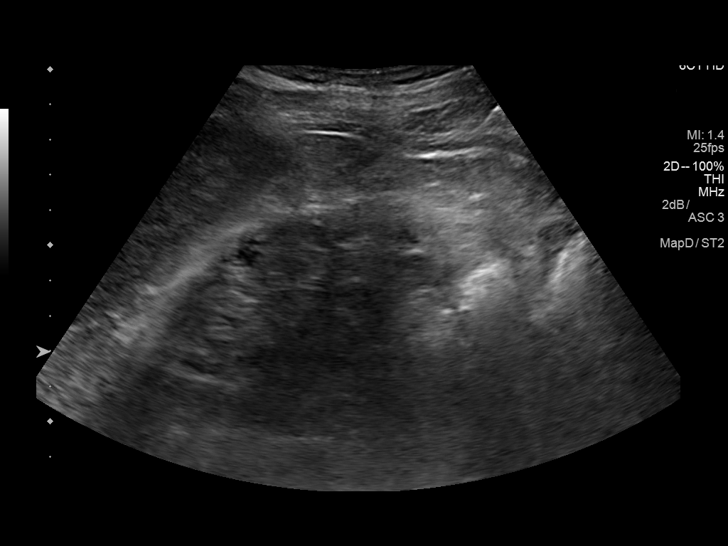
[im 9/49]
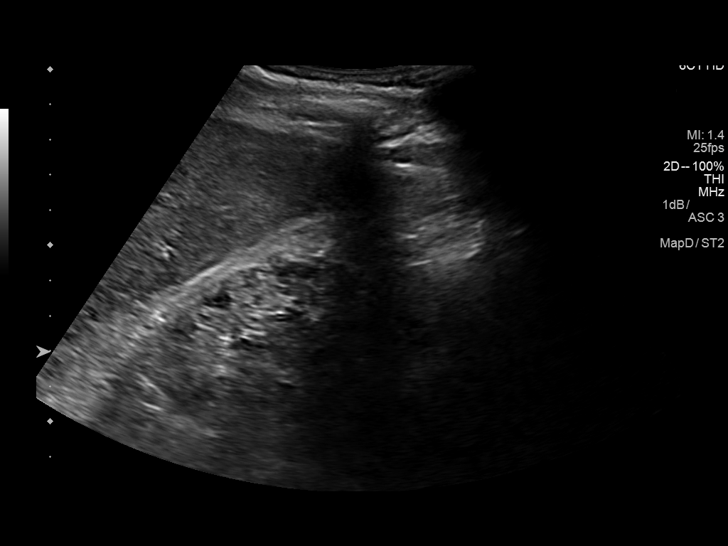
[im 13/49]
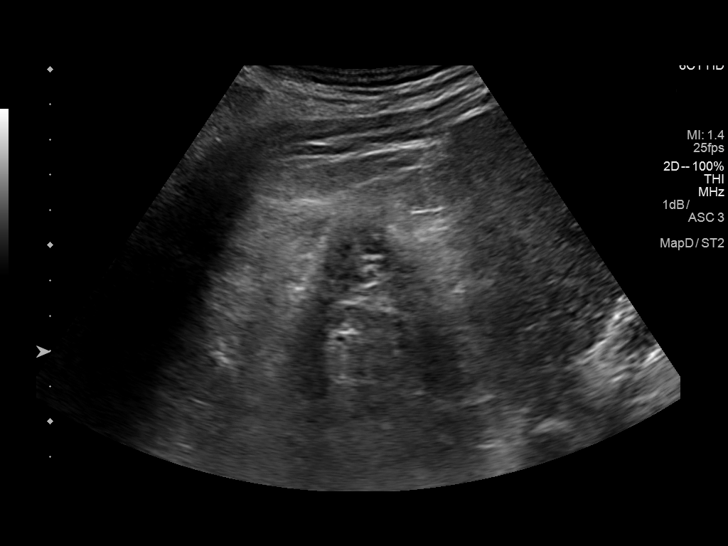
[im 17/49]
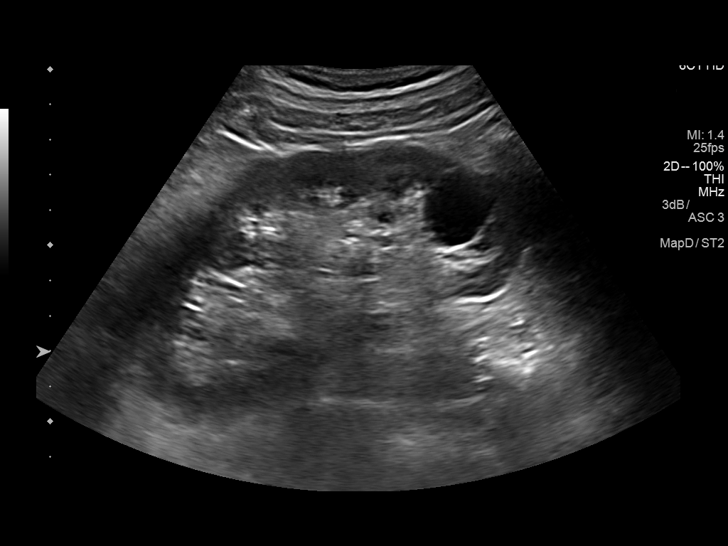
[im 19/49]
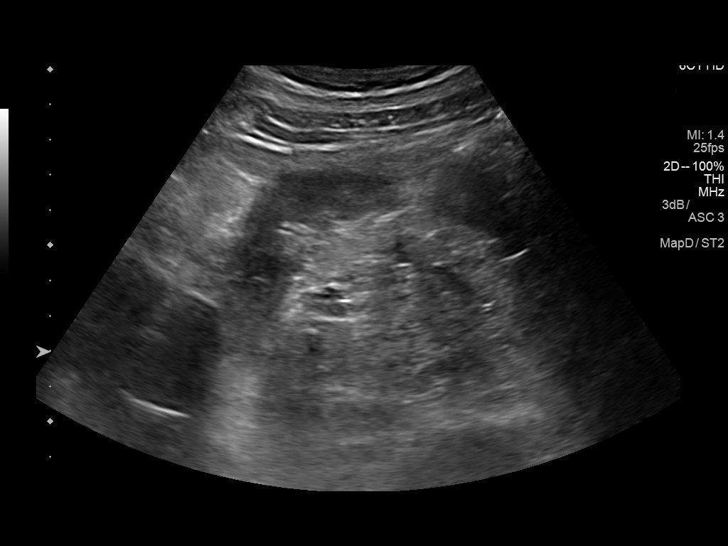
[im 23/49]
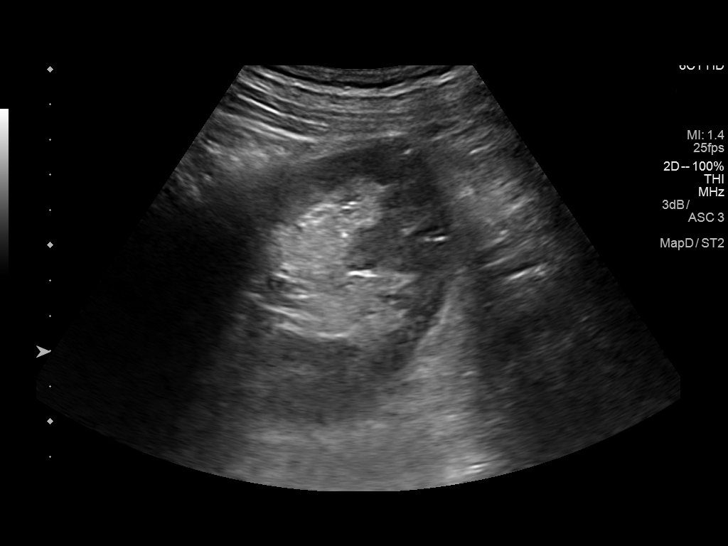
[im 27/49]
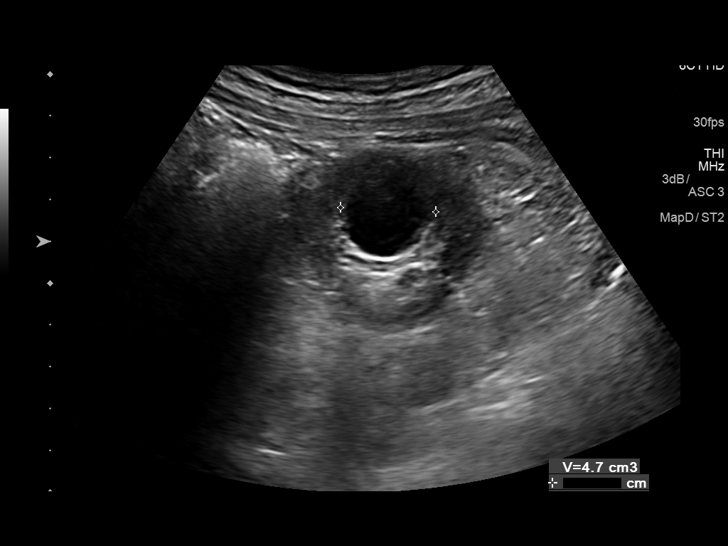
[im 31/49]
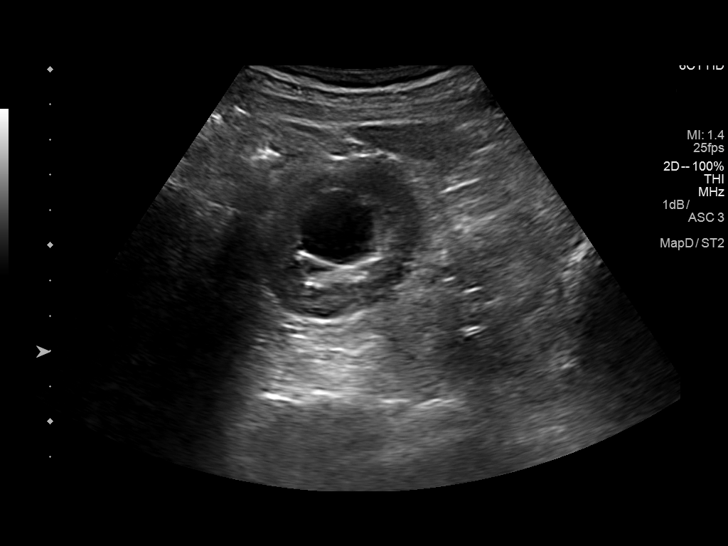
[im 33/49]
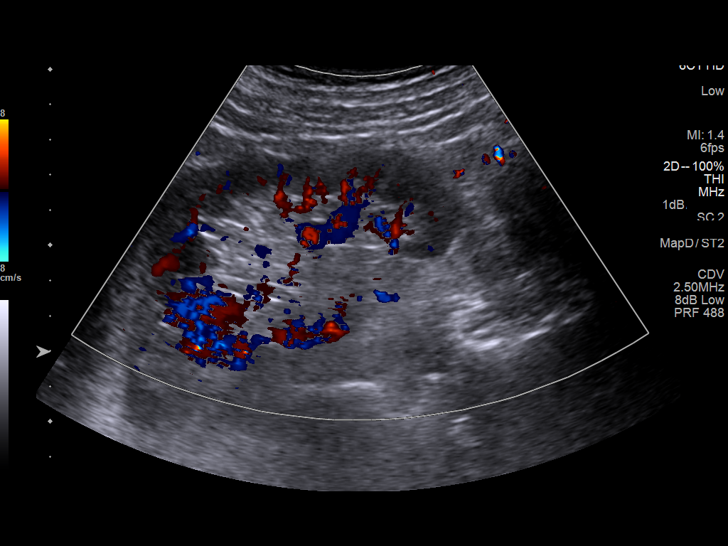
[im 37/49]
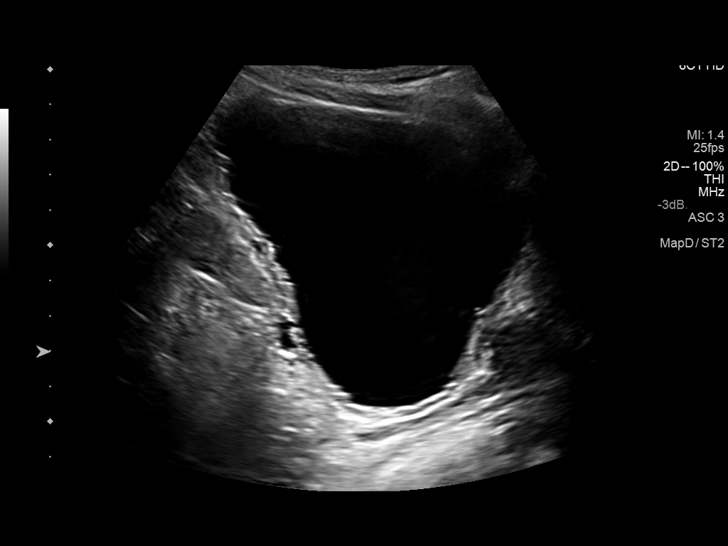
[im 41/49]
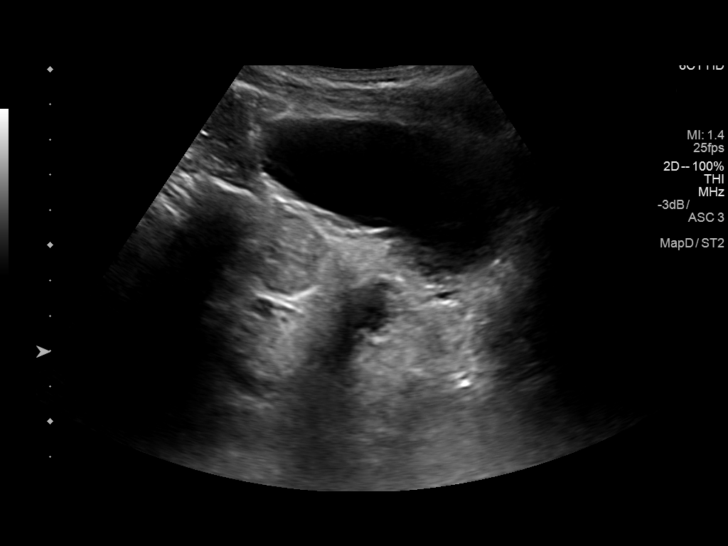
[im 45/49]
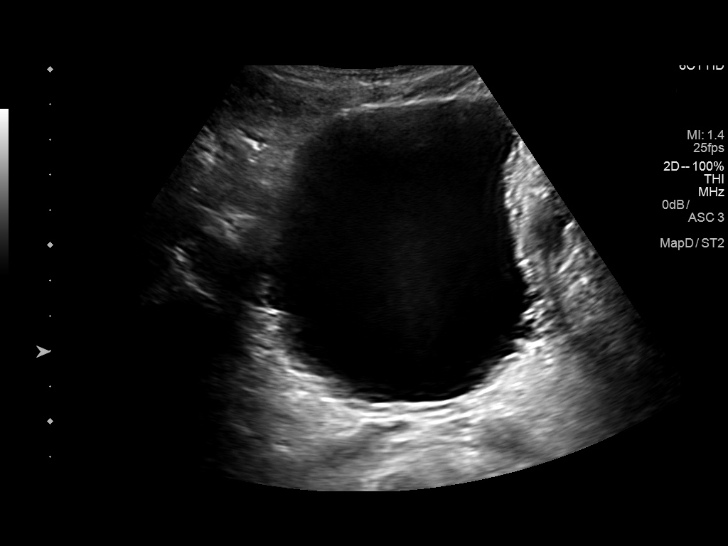
[im 49/49]
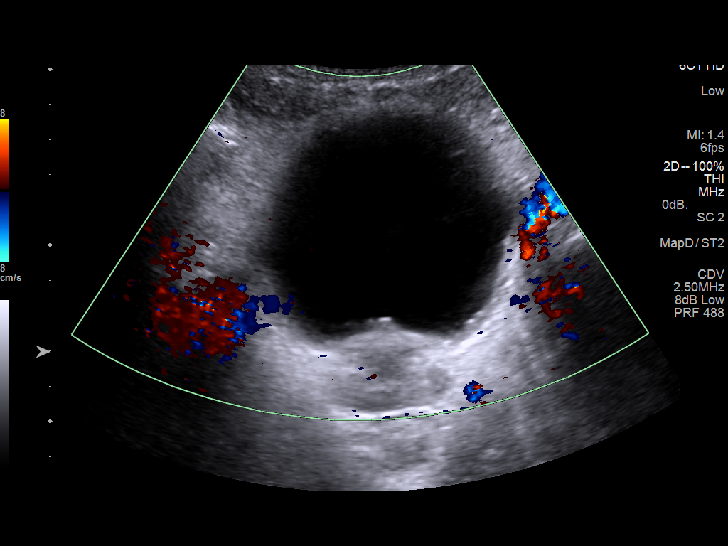

[14 of 25 positions shown; findings below may reference images not displayed]

FINDINGS: Right Kidney:

Renal measurements: 9.3 x 4.5 x 3.4 cm. = volume: 73 mL. Diffuse
cortical thinning is noted. Increased echogenicity is noted as well.

Left Kidney:

Renal measurements: 11.9 x 6.1 x 6.0 cm = volume: 230 mL. Lower pole
cyst is noted measuring 2.3 cm. This is slightly larger than that
seen on the prior exam at which time it measured 1.7 cm. No mass
lesion or hydronephrosis is noted.

Bladder:

Appears normal for degree of bladder distention.
IMPRESSION: Lower pole left renal cyst slightly enlarged when compared with the
prior exam of 4382.

Chronic cortical thinning in the right kidney with increased
echogenicity.

## 2019-08-25 ENCOUNTER — Ambulatory Visit: Payer: Medicare HMO | Attending: Internal Medicine

## 2019-08-25 DIAGNOSIS — Z23 Encounter for immunization: Secondary | ICD-10-CM

## 2019-08-25 NOTE — Progress Notes (Signed)
   Covid-19 Vaccination Clinic  Name:  Justin Salinas    MRN: [id number] DOB: 1940-11-03  08/25/2019  Mr. [name] was observed post Covid-19 immunization for 15 minutes without incident. He was provided with Vaccine Information Sheet and instruction to access the V-Safe system.   Mr. [name] was instructed to call 911 with any severe reactions post vaccine: Marland Kitchen Difficulty breathing  . Swelling of face and throat  . A fast heartbeat  . A bad rash all over body  . Dizziness and weakness   Immunizations Administered    Name Date Dose VIS Date Route   Pfizer COVID-19 Vaccine 08/25/2019  8:30 AM 0.3 mL 05/15/2019 Intramuscular   Manufacturer: [place]   Lot: [id number]   [place]: [id number]

## 2019-08-27 DIAGNOSIS — Z9621 Cochlear implant status: Secondary | ICD-10-CM | POA: Diagnosis not present

## 2019-08-27 DIAGNOSIS — L089 Local infection of the skin and subcutaneous tissue, unspecified: Secondary | ICD-10-CM | POA: Diagnosis not present

## 2019-09-02 ENCOUNTER — Telehealth: Payer: Self-pay

## 2019-09-02 NOTE — Telephone Encounter (Signed)
Patient called my direct line requesting an inhaler be sent in to the pharmacy for his seasonal allergies. Please advise

## 2019-09-02 NOTE — Telephone Encounter (Signed)
Please call pt to get more info - do not think we have sent in an inhaler in the past.

## 2019-09-03 MED ORDER — IPRATROPIUM BROMIDE 0.06 % NA SOLN: 2.0000 | Freq: Four times a day (QID) | NASAL | 12 refills | Status: DC

## 2019-09-03 NOTE — Telephone Encounter (Signed)
Called patient states it was not inhaler but the Atrovent if ok to send in have pulled up for you.

## 2019-09-03 NOTE — Addendum Note (Signed)
Addended by: Francella Solian on: 09/03/2019 03:48 PM   Modules accepted: Orders

## 2019-09-03 NOTE — Addendum Note (Signed)
Addended by: Francella Solian on: 09/03/2019 02:27 PM   Modules accepted: Orders

## 2019-09-03 NOTE — Telephone Encounter (Signed)
Ok with me. Please place any necessary orders. 

## 2019-09-03 NOTE — Telephone Encounter (Signed)
Called in let patient know requested local

## 2019-09-05 DIAGNOSIS — W19XXXA Unspecified fall, initial encounter: Secondary | ICD-10-CM | POA: Diagnosis not present

## 2019-09-05 DIAGNOSIS — S01511A Laceration without foreign body of lip, initial encounter: Secondary | ICD-10-CM | POA: Diagnosis not present

## 2019-09-12 DIAGNOSIS — W19XXXA Unspecified fall, initial encounter: Secondary | ICD-10-CM | POA: Diagnosis not present

## 2019-09-12 DIAGNOSIS — S01511A Laceration without foreign body of lip, initial encounter: Secondary | ICD-10-CM | POA: Diagnosis not present

## 2019-09-15 DIAGNOSIS — I129 Hypertensive chronic kidney disease with stage 1 through stage 4 chronic kidney disease, or unspecified chronic kidney disease: Secondary | ICD-10-CM | POA: Diagnosis not present

## 2019-09-15 DIAGNOSIS — N400 Enlarged prostate without lower urinary tract symptoms: Secondary | ICD-10-CM | POA: Diagnosis not present

## 2019-09-15 DIAGNOSIS — D631 Anemia in chronic kidney disease: Secondary | ICD-10-CM | POA: Diagnosis not present

## 2019-09-15 DIAGNOSIS — N183 Chronic kidney disease, stage 3 unspecified: Secondary | ICD-10-CM | POA: Diagnosis not present

## 2019-09-15 DIAGNOSIS — E1129 Type 2 diabetes mellitus with other diabetic kidney complication: Secondary | ICD-10-CM | POA: Diagnosis not present

## 2019-09-17 DIAGNOSIS — Z9621 Cochlear implant status: Secondary | ICD-10-CM | POA: Diagnosis not present

## 2019-09-17 DIAGNOSIS — H90A22 Sensorineural hearing loss, unilateral, left ear, with restricted hearing on the contralateral side: Secondary | ICD-10-CM | POA: Diagnosis not present

## 2019-09-21 ENCOUNTER — Encounter: Payer: Self-pay | Admitting: Endocrinology

## 2019-09-21 ENCOUNTER — Ambulatory Visit: Payer: Medicare HMO | Admitting: Endocrinology

## 2019-09-21 ENCOUNTER — Other Ambulatory Visit: Payer: Self-pay

## 2019-09-21 VITALS — BP 122/50 | HR 62 | Ht 70.0 in | Wt 175.4 lb

## 2019-09-21 DIAGNOSIS — Z794 Long term (current) use of insulin: Secondary | ICD-10-CM | POA: Diagnosis not present

## 2019-09-21 DIAGNOSIS — E1142 Type 2 diabetes mellitus with diabetic polyneuropathy: Secondary | ICD-10-CM

## 2019-09-21 DIAGNOSIS — E1165 Type 2 diabetes mellitus with hyperglycemia: Secondary | ICD-10-CM

## 2019-09-21 LAB — BASIC METABOLIC PANEL
BUN: 61 mg/dL — ABNORMAL HIGH (ref 6–23)
CO2: 23 mEq/L (ref 19–32)
Calcium: 8.7 mg/dL (ref 8.4–10.5)
Chloride: 102 mEq/L (ref 96–112)
Creatinine, Ser: 2.65 mg/dL — ABNORMAL HIGH (ref 0.40–1.50)
GFR: 23.28 mL/min — ABNORMAL LOW (ref >60.00)
Glucose, Bld: 169 mg/dL — ABNORMAL HIGH (ref 70–99)
Potassium: 4.1 mEq/L (ref 3.5–5.1)
Sodium: 133 mEq/L — ABNORMAL LOW (ref 135–145)

## 2019-09-21 NOTE — Progress Notes (Signed)
Patient ID: Justin Salinas, male   DOB: 1941-05-01, 79 y.o.   MRN: [id number]           Reason for Appointment: Follow-up for Type 2 Diabetes    History of Present Illness:          Date of diagnosis of type 2 diabetes mellitus:2000        Background history:   His diabetes had been mild initially and treated with metformin He had subsequently other medications added including Amaryl and Januvia No details are available from PCP office, [name] in 2015 was 7.6  Because of his poor control was started on insulin on 01/03/17. He has been on Actos since 06/2017+ He had a baseline A1c A1c 9.7 on his initial consultation in 01/2017  Recent history:   Insulin regimen: NovoLog mix, 8 units at breakfast and 10 at supper Prandin 1 mg at breakfast and lunch daily  A1c on the last visit was at 7.3, previously as low as 6%  Current management, blood sugar patterns and problems identified:  His insulin dose was supposed to be increased to 10 units in the morning and evening dose reduced to 8 units but he forgot to look at his instructions  He did start Prandin instead of acarbose and his blood sugars are overall somewhat better, also not sure if he is taking this consistently before starting to eat as directed  However he is checking blood sugars only at breakfast and dinnertime and not after meals as directed  For breakfast today he had oatmeal and toast and usually does not get protein  Still not walking because of knee pain  No change in his weight  His blood sugars are as high as 244 before dinnertime he thinks this is from eating too much  Yesterday with eating only a salad his blood sugar was 146 before dinner  As before no readings checked after dinner  Side effects from medications have been: None  Compliance with the medical regimen: Good  Glucose monitoring:  done 2 times a day         Glucometer:  Accu-Chek guide me .      Blood Glucose readings by download of  monitor:    PRE-MEAL Fasting Lunch Dinner Bedtime Overall  Glucose range:  79-125   146-244    Mean/median:  107  173  142   POST-MEAL PC Breakfast PC Lunch PC Dinner  Glucose range: ?  215    Mean/median:        PRE-MEAL Fasting Lunch Dinner Bedtime Overall  Glucose range:  85-134   93-337    Mean/median: 105   195   149   Previous readings:  PRE-MEAL Fasting Lunch Dinner Bedtime Overall  Glucose range:  79-106   73-210    Mean/median:  93   150   120    Self-care: The diet that the patient has been following is: tries to limit sweets, fried food       Typical meal intake: Breakfast is eggs,  Oatmeal/ meat, toast.  Lunch: Sandwich or.Crackers with or without peanut butter usually                Dietician visit, most recent: 03/2017   Weight history:   Wt Readings from Last 3 Encounters:  09/21/19 175 lb 6.4 oz (79.6 kg)  08/05/19 179 lb (81.2 kg)  07/29/19 176 lb (79.8 kg)    Glycemic control:   Lab Results  Component  Value Date   HGBA1C 7.3 (H) 07/20/2019   HGBA1C 6.0 (A) 04/27/2019   HGBA1C 6.4 (A) 01/21/2019   Lab Results  Component Value Date   MICROALBUR <0.7 04/27/2019   LDLCALC 33 04/27/2019   CREATININE 2.06 (H) 07/20/2019   Lab Results  Component Value Date   MICRALBCREAT 0.6 04/27/2019   Microalbumin ratio 9 in 12/2015  Lab Results  Component Value Date   FRUCTOSAMINE 321 (H) 04/27/2019   FRUCTOSAMINE 334 (H) 10/14/2018   FRUCTOSAMINE 351 (H) 02/14/2017      Allergies as of 09/21/2019      Reactions   Lasix [furosemide] Other (See Comments)   Constipation      Medication List       Accurate as of September 21, 2019  8:35 AM. If you have any questions, ask your nurse or doctor.        acarbose 25 MG tablet Commonly known as: PRECOSE TAKE 1 TABLET THREE TIMES DAILY WITH MEALS   Accu-Chek Softclix Lancet Dev Kit   alfuzosin 10 MG 24 hr tablet Commonly known as: UROXATRAL Take 10 mg by mouth daily with breakfast.   amLODipine  10 MG tablet Commonly known as: NORVASC Take 1 tablet (10 mg total) by mouth daily.   aspirin 81 MG tablet Take 81 mg by mouth daily.   CENTRUM SILVER ADULT 50+ PO Take by mouth.   Droplet Pen Needles 31G X 5 MM Misc Generic drug: Insulin Pen Needle USE AS DIRECTED TWICE DAILY   furosemide 20 MG tablet Commonly known as: LASIX Take 20 mg by mouth in the morning, at noon, and at bedtime. Take 2 tablets by mouth in the morning, 1 tablet at lunch, and 1 tablet at dinner. What changed: Another medication with the same name was removed. Continue taking this medication, and follow the directions you see here. Changed by: Elayne Snare, MD   glucose blood test strip Commonly known as: Accu-Chek Guide Use to check blood sugars 2 times daily   Accu-Chek Guide test strip Generic drug: glucose blood TEST  BLOOD  SUGARS TWICE DAILY   hydrALAZINE 50 MG tablet Commonly known as: APRESOLINE Take 1 tablet (50 mg total) by mouth 3 (three) times daily.   Insulin Asp Prot & Asp FlexPen (70-30) 100 UNIT/ML Supn Inject into the skin 2 (two) times daily. Inject 8 units under the skin at breakfast and 10 units at supper.   ipratropium 0.06 % nasal spray Commonly known as: ATROVENT Place 2 sprays into both nostrils 4 (four) times daily.   losartan 100 MG tablet Commonly known as: COZAAR Take 1 tablet (100 mg total) by mouth daily. Pt must keep appt in Jan, 2021 for future refills   repaglinide 1 MG tablet Commonly known as: Prandin Take 1 tablet (1 mg total) by mouth 2 (two) times daily before a meal.   simvastatin 40 MG tablet Commonly known as: ZOCOR Take 1 tablet (40 mg total) by mouth daily at 6 PM. What changed: Another medication with the same name was removed. Continue taking this medication, and follow the directions you see here. Changed by: Elayne Snare, MD   TRUEplus Lancets 28G Misc USE TO TEST BLOOD SUGAR 2 TIMES DAILY   vitamin C 1000 MG tablet Take 1,000 mg by mouth  daily.       Allergies:  Allergies  Allergen Reactions  . Lasix [Furosemide] Other (See Comments)    Constipation    Past Medical History:  Diagnosis Date  .  Allergy   . Anemia   . Asthma   . BPH (benign prostatic hyperplasia)   . CAD (coronary artery disease)    a. s/p MI 09/1993;  b. known CTO of RCA;  c. 06/2014 Cath: LM short/mod dzs, LAD 95ost, LCX nl, RI nl, RCA 100, EF 55%;  c. 06/2014 s/p CABG x 4: LIMA->LAD, VG->D1, VG->OM1, VG->Acute Marginal.  . Carotid stenosis    a. Carotid US 5/46:  RICA 50-35%; LICA 46-56% >> FU 1 year  . CKD (chronic kidney disease), stage III   . Constipation    in the past 2-3 weeks  . Diabetes ([place])    AODM  . Diastolic dysfunction    a. 05/2015 Echo: EF 60-65%, LVH, Gr 1 DD, mild MR, mod dil RA/LA, mod TR, PASP 47mHg.  . Essential hypertension   . GERD (gastroesophageal reflux disease)   . Hyperlipidemia     Past Surgical History:  Procedure Laterality Date  . ANGIOPLASTY  09/22/1993   POBA of RCA (Dr. RMarella Chimes  . BACK SURGERY  2000   back fusion - Dr. HEarnie Larsson . CARDIAC CATHETERIZATION  11/28/1998   "silent occlusion" of RCA w/collaterals from left coronary system, prox LAD w/40-50% eccentric narrowing, 1st diagonal with 70-80% eccentric narrowing, 85% narrowing of prox small OM1 (Dr. RMarella Chimes  . CAROTID DOPPLER  07/2011   left subclavian (50-69%); right bulb (0-49%); RICA (normal); left mid-distal CCA (0-49%); left bulb/prox ICA (50-69%); left vertebral with abnormal antegrade flow  . CORONARY ARTERY BYPASS GRAFT N/A 05/21/2014   Procedure: CORONARY ARTERY BYPASS GRAFTING (CABG) x  four, using left internal mammary artery and right leg greater saphenous vein harvested endoscopically;  Surgeon: SMelrose Nakayama MD;  [facility]  Service: Open Heart Surgery;  Laterality: N/A;  . INTRAOPERATIVE TRANSESOPHAGEAL ECHOCARDIOGRAM N/A 05/21/2014   Procedure: INTRAOPERATIVE TRANSESOPHAGEAL ECHOCARDIOGRAM;  Surgeon: SMelrose Nakayama MD;  Location: [facility]  Service: Open Heart Surgery;  Laterality: N/A;  . LEFT HEART CATHETERIZATION WITH CORONARY ANGIOGRAM N/A 05/19/2014   Procedure: LEFT HEART CATHETERIZATION WITH CORONARY ANGIOGRAM;  Surgeon: MBlane Ohara MD;  [facility];  Service: Cardiovascular;  Laterality: N/A;  . NASAL SINUS SURGERY  2010  . NM MYOCAR PERF WALL MOTION  09/19/2009   bruce myoview - mild perfusion defect in basal inferior region (infarct/scar), EF 60%, low risk scan  . RENAL DOPPLER  10/2011   SMA w/ 70-99% diameter reduction & high grade stenosis; R&L renals w/narrowing and increased velocities (60-99%), R kidney smaller than L  . TRANSTHORACIC ECHOCARDIOGRAM  10/20/2012   EF 55-60%, moderate concentric hypertrophy, ventricular septum thickness increased, calcified MV annulus  . VASECTOMY  1963    Family History  Problem Relation Age of Onset  . Uterine cancer Mother   . Stroke Father   . Diabetes Sister   . Colon polyps Brother   . Heart disease Brother   . Colon cancer Neg Hx   . Esophageal cancer Neg Hx   . Rectal cancer Neg Hx   . Stomach cancer Neg Hx     Social History:  reports that he quit smoking about 47 years ago. His smoking use included cigarettes. His smokeless tobacco use includes snuff. He reports current alcohol use of about 3.0 standard drinks of alcohol per week. He reports that he does not use drugs.   Review of Systems   Lipid history: Taking simvastatin 40 mg, most recent LDL:  This is prescribed by  cardiologist    Lab Results  Component Value Date   CHOL 96 04/27/2019   HDL 55.30 04/27/2019   LDLCALC 33 04/27/2019   LDLDIRECT 45.0 04/17/2017   TRIG 40.0 04/27/2019   CHOLHDL 2 04/27/2019           Hypertension: Blood pressure is controlled with adding hydralazine and he is also on Lasix and chlorthalidone Lasix was apparently increased by nephrologist   BP Readings from Last 3 Encounters:  09/21/19 (!) 122/50  08/05/19 (!)  149/50  08/03/19 (!) 130/56   Renal function has been variable  He has been followed by nephrologist regularly Ultrasound showed reduced right kidney size  Takes Lasix for edema   Lab Results  Component Value Date   CREATININE 2.06 (H) 07/20/2019   CREATININE 1.70 (H) 04/27/2019   CREATININE 2.0 (A) 12/22/2018     Most recent eye exam was 10/19  Most recent foot exam: 09/2019   LABS:  No visits with results within 1 Week(s) from this visit.  Latest known visit with results is:  Lab on 07/20/2019  Component Date Value Ref Range Status  . Sodium 07/20/2019 135  135 - 145 mEq/L Final  . Potassium 07/20/2019 4.7  3.5 - 5.1 mEq/L Final  . Chloride 07/20/2019 100  96 - 112 mEq/L Final  . CO2 07/20/2019 28  19 - 32 mEq/L Final  . Glucose, Bld 07/20/2019 279* 70 - 99 mg/dL Final  . BUN 07/20/2019 49* 6 - 23 mg/dL Final  . Creatinine, Ser 07/20/2019 2.06* 0.40 - 1.50 mg/dL Final  . Total Bilirubin 07/20/2019 0.5  0.2 - 1.2 mg/dL Final  . Alkaline Phosphatase 07/20/2019 76  39 - 117 U/L Final  . AST 07/20/2019 20  0 - 37 U/L Final  . ALT 07/20/2019 17  0 - 53 U/L Final  . Total Protein 07/20/2019 6.5  6.0 - 8.3 g/dL Final  . Albumin 07/20/2019 3.8  3.5 - 5.2 g/dL Final  . GFR 07/20/2019 31.15* >60.00 mL/min Final  . Calcium 07/20/2019 8.7  8.4 - 10.5 mg/dL Final  . Hgb A1c MFr Bld 07/20/2019 7.3* 4.6 - 6.5 % Final   Glycemic Control Guidelines for People with Diabetes:Non Diabetic:  <6%Goal of Therapy: <7%Additional Action Suggested:  >8%     Physical Examination:  BP (!) 122/50 (BP Location: Left Arm, Patient Position: Sitting, Cuff Size: Normal)   Pulse 62   Ht _0  (1.778 m)   Wt 175 lb 6.4 oz (79.6 kg)   SpO2 97%   BMI 25.17 kg/m   Diabetic Foot Exam - Simple   Simple Foot Form Visual Inspection See comments: Yes Sensation Testing See comments: Yes Pulse Check Posterior Tibialis and Dorsalis pulse intact bilaterally: Yes Comments Mild bony deformities of  toes Nearly absent monofilament sensation in the distal toes Trace pedal edema      ASSESSMENT:  Diabetes type 2, insulin requiring  See history of present illness for detailed discussion of current diabetes management, blood sugar patterns and problems identified  [name] was previously 7.3, higher than usual  He is on twice a day premixed insulin along with repaglinide at mealtimes  He still has significantly high readings at dinnertime sometimes based on his diet Also likely has high readings after breakfast since he does not get much protein in the morning  He did not follow any instructions for insulin changes or glucose monitoring as instructed the last time Not able to exercise because of knee pain No  hypoglycemia   HYPERTENSION and renal dysfunction: The blood pressure is controlled today  Neuropathy: Has significant sensory loss    PLAN:   He does need to add a protein to breakfast daily He will try to cut back on sweets and large portions especially at lunchtime  Start taking repaglinide at lunchtime also unless eating only a snack; he will likely not remember to take insulin as directed Written instructions given Detailed instructions given on when to check his sugars and discussed need to try and keep postprandial readings at least under 200 most of the time   He will increase his morning dose to 10 units for now and reduce evening dose to 8 units  We will need to review his blood sugar patterns on the next visit in about 2 months and A1c will be rechecked  He will need to call us if he has consistently high readings over 200 after meals  Given information on foot care for neuropathy     There are no Patient Instructions on file for this visit.   Elayne Snare 09/21/2019, 8:35 AM   Note: This office note was prepared with Dragon voice recognition system technology. Any transcriptional errors that result from this process are unintentional.

## 2019-09-21 NOTE — Patient Instructions (Addendum)
Checking blood sugars:  Instead of checking sugar before dinner check it about 2 hours after dinner at least every other day Also on some days to check her sugar AFTER breakfast at 9-10 AM instead of before breakfast  Call if blood sugars are consistently over 200  Have a protein at breakfast such as an egg, lean meat or some form of low-fat cheese or yogurt instead of toast and oatmeal Have only small portions of sweets if any  INSULIN: Increase morning insulin to 10 units and reduce suppertime dose to 8 units  Start taking REPAGLINIDE before lunch also, may skip if you are eating only a snack or a salad

## 2019-09-22 LAB — FRUCTOSAMINE: Fructosamine: 352 umol/L — ABNORMAL HIGH (ref 0–285)

## 2019-09-30 ENCOUNTER — Telehealth (INDEPENDENT_AMBULATORY_CARE_PROVIDER_SITE_OTHER): Payer: Medicare HMO | Admitting: Family Medicine

## 2019-09-30 DIAGNOSIS — M1990 Unspecified osteoarthritis, unspecified site: Secondary | ICD-10-CM

## 2019-09-30 MED ORDER — PREDNISONE 20 MG PO TABS: 20.0000 mg | ORAL_TABLET | Freq: Every day | ORAL | 0 refills | Status: DC

## 2019-09-30 NOTE — Progress Notes (Signed)
   Justin Salinas is a 79 y.o. male who presents today for a telephone visit.  Assessment/Plan:  New/Acute Problems: Joint pain Likely OA flare related to recent Covid vaccine.  Discussed limitations of telephone encounter and inability to perform physical exam.  He has not able to do video visit due to limited technology access.  Will avoid NSAIDs due to history of CKD.  Will start low-dose prednisone 20 mg daily x5 days.  Also recommended Tylenol as needed.  If not improving in the next 5 days or so recommended he come in for office visit with physical exam and possible x-ray.  Discussed reasons return to care.  Follow-up as needed.  Chronic Problems Addressed Today: No problem-specific Assessment & Plan notes found for this encounter.     Subjective:  HPI:  Started about 3-4 weeks ago with neck and knee pain. No injuries or obvious precipitating events. Symptoms are overall stable. Some swelling in his knees. Symptoms started after receiving second dose of covid vaccines. No specific treatments tried.        Objective/Observations   NAD  Telephone Visit   I connected with Leeum Sankey Reidel on 09/30/19 at 11:00 AM EDT via telephone and verified that I am speaking with the correct person using two identifiers. I discussed the limitations of evaluation and management by telemedicine and the availability of in person appointments. The patient expressed understanding and agreed to proceed.   Patient location: Home Provider location: [place] participating in the virtual visit: Myself and Patient   A total of 11 minutes were spent on medical discussion.      [name]. [name], MD 09/30/2019 10:12 AM

## 2019-10-03 ENCOUNTER — Other Ambulatory Visit: Payer: Self-pay | Admitting: Endocrinology

## 2019-10-17 ENCOUNTER — Other Ambulatory Visit: Payer: Self-pay | Admitting: Endocrinology

## 2019-10-22 ENCOUNTER — Other Ambulatory Visit: Payer: Self-pay | Admitting: Family Medicine

## 2019-10-22 NOTE — Telephone Encounter (Signed)
Medication is not on patient's medication list. Please advise.

## 2019-10-22 NOTE — Telephone Encounter (Signed)
Recommend they follow up with Dr Dwyane Dee - looks like he stopped the medication at his last office visit.

## 2019-11-18 DIAGNOSIS — D631 Anemia in chronic kidney disease: Secondary | ICD-10-CM | POA: Diagnosis not present

## 2019-11-18 DIAGNOSIS — N400 Enlarged prostate without lower urinary tract symptoms: Secondary | ICD-10-CM | POA: Diagnosis not present

## 2019-11-18 DIAGNOSIS — E1122 Type 2 diabetes mellitus with diabetic chronic kidney disease: Secondary | ICD-10-CM | POA: Diagnosis not present

## 2019-11-18 DIAGNOSIS — N183 Chronic kidney disease, stage 3 unspecified: Secondary | ICD-10-CM | POA: Diagnosis not present

## 2019-11-18 DIAGNOSIS — I129 Hypertensive chronic kidney disease with stage 1 through stage 4 chronic kidney disease, or unspecified chronic kidney disease: Secondary | ICD-10-CM | POA: Diagnosis not present

## 2019-11-22 NOTE — Progress Notes (Signed)
Patient ID: Justin Salinas, male   DOB: 1940/07/13, 79 y.o.   MRN: [id number]           Reason for Appointment: Follow-up for Type 2 Diabetes    History of Present Illness:          Date of diagnosis of type 2 diabetes mellitus:2000        Background history:   His diabetes had been mild initially and treated with metformin He had subsequently other medications added including Amaryl and Januvia No details are available from PCP office, [name] in 2015 was 7.6  Because of his poor control was started on insulin on 01/03/17. He has been on Actos since 06/2017+ He had a baseline A1c A1c 9.7 on his initial consultation in 01/2017  Recent history:   Insulin regimen: NovoLog mix, 10 units at breakfast and 8 at supper Prandin 1 mg at breakfast and lunch daily  A1c is back down to 6%  Current management, blood sugar patterns and problems identified:  His insulin dose in the morning was increased by 2 units and reduced 40 units in the evening  He thinks he is following these instructions  With this his blood sugars are somewhat better overall at dinnertime and less on the low normal side at breakfast time  However he still cannot understand the need to check readings after meals sometimes instead of just before eating  He thinks he is also trying to take his lunchtime Prandin more regularly  [name]  Not able to do any physical activity because of back and leg pain  He has lost weight  Checking blood sugars usually twice a day  Side effects from medications have been: None  Compliance with the medical regimen: Good  Glucose monitoring:  done 2 times a day         Glucometer:  Accu-Chek guide me .      Blood Glucose readings by download of monitor:    PRE-MEAL Fasting Lunch Dinner Bedtime Overall  Glucose range:  93-133   111-228    Mean/median:  115   166   139   Previous readings:  PRE-MEAL Fasting Lunch Dinner Bedtime Overall  Glucose range:  79-125    146-244    Mean/median:  107  173  142   POST-MEAL PC Breakfast PC Lunch PC Dinner  Glucose range: ?  215    Mean/median:         Self-care: The diet that the patient has been following is: tries to limit sweets, fried food       Typical meal intake: Breakfast is eggs,  Oatmeal/ meat, toast.  Lunch: Sandwich or.Crackers with or without peanut butter usually                Dietician visit, most recent: 03/2017   Weight history:   Wt Readings from Last 3 Encounters:  11/23/19 161 lb 12.8 oz (73.4 kg)  09/21/19 175 lb 6.4 oz (79.6 kg)  08/05/19 179 lb (81.2 kg)    Glycemic control:   Lab Results  Component Value Date   HGBA1C 6.0 (A) 11/23/2019   HGBA1C 7.3 (H) 07/20/2019   HGBA1C 6.0 (A) 04/27/2019   Lab Results  Component Value Date   MICROALBUR <0.7 04/27/2019   LDLCALC 33 04/27/2019   CREATININE 2.65 (H) 09/21/2019   Lab Results  Component Value Date   MICRALBCREAT 0.6 04/27/2019   Microalbumin ratio 9 in 12/2015  Lab Results  Component  Value Date   FRUCTOSAMINE 352 (H) 09/21/2019   FRUCTOSAMINE 321 (H) 04/27/2019   FRUCTOSAMINE 334 (H) 10/14/2018      Allergies as of 11/23/2019      Reactions   Lasix [furosemide] Other (See Comments)   Constipation      Medication List       Accurate as of November 23, 2019  8:31 AM. If you have any questions, ask your nurse or doctor.        Accu-Chek Softclix Lancet Dev Kit   alfuzosin 10 MG 24 hr tablet Commonly known as: UROXATRAL Take 10 mg by mouth daily with breakfast.   amLODipine 10 MG tablet Commonly known as: NORVASC Take 1 tablet (10 mg total) by mouth daily.   aspirin 81 MG tablet Take 81 mg by mouth daily.   CENTRUM SILVER ADULT 50+ PO Take by mouth.   Droplet Pen Needles 31G X 5 MM Misc Generic drug: Insulin Pen Needle USE AS DIRECTED TWICE DAILY   furosemide 20 MG tablet Commonly known as: LASIX Take 20 mg by mouth in the morning, at noon, and at bedtime. Take 2 tablets by mouth in  the morning, and 1 tablet at dinner.   glucose blood test strip Commonly known as: Accu-Chek Guide Use to check blood sugars 2 times daily   Accu-Chek Guide test strip Generic drug: glucose blood TEST  BLOOD  SUGARS TWICE DAILY   hydrALAZINE 50 MG tablet Commonly known as: APRESOLINE Take 1 tablet (50 mg total) by mouth 3 (three) times daily.   ipratropium 0.06 % nasal spray Commonly known as: ATROVENT Place 2 sprays into both nostrils 4 (four) times daily.   losartan 100 MG tablet Commonly known as: COZAAR Take 1 tablet (100 mg total) by mouth daily. Pt must keep appt in Jan, 2021 for future refills What changed: additional instructions   NovoLOG Mix 70/30 FlexPen (70-30) 100 UNIT/ML FlexPen Generic drug: insulin aspart protamine - aspart Inject 10 units under the skin before breakfast and 8 units at dinner.   predniSONE 20 MG tablet Commonly known as: DELTASONE Take 1 tablet (20 mg total) by mouth daily with breakfast.   repaglinide 1 MG tablet Commonly known as: PRANDIN TAKE 1 TABLET (1 MG TOTAL) BY MOUTH 2 (TWO) TIMES DAILY BEFORE A MEAL.   simvastatin 40 MG tablet Commonly known as: ZOCOR Take 1 tablet (40 mg total) by mouth daily at 6 PM.   TRUEplus Lancets 28G Misc USE TO TEST BLOOD SUGAR 2 TIMES DAILY   vitamin C 1000 MG tablet Take 1,000 mg by mouth daily.       Allergies:  Allergies  Allergen Reactions   Lasix [Furosemide] Other (See Comments)    Constipation    Past Medical History:  Diagnosis Date   Allergy    Anemia    Asthma    BPH (benign prostatic hyperplasia)    CAD (coronary artery disease)    a. s/p MI 09/1993;  b. known CTO of RCA;  c. 06/2014 Cath: LM short/mod dzs, LAD 95ost, LCX nl, RI nl, RCA 100, EF 55%;  c. 06/2014 s/p CABG x 4: LIMA->LAD, VG->D1, VG->OM1, VG->Acute Marginal.   Carotid stenosis    a. Carotid US 1/09:  RICA 32-35%; LICA 57-32% >> FU 1 year   CKD (chronic kidney disease), stage III    Constipation    in  the past 2-3 weeks   Diabetes ([place])    AODM   Diastolic dysfunction    a.  05/2015 Echo: EF 60-65%, LVH, Gr 1 DD, mild MR, mod dil RA/LA, mod TR, PASP .   Essential hypertension    GERD (gastroesophageal reflux disease)    Hyperlipidemia     Past Surgical History:  Procedure Laterality Date   ANGIOPLASTY  09/22/1993   POBA of RCA (Dr. Jonette Eva)   BACK SURGERY  2000   back fusion - Dr. Julio Sicks   CARDIAC CATHETERIZATION  11/28/1998   "silent occlusion" of RCA w/collaterals from left coronary system, prox LAD w/40-50% eccentric narrowing, 1st diagonal with 70-80% eccentric narrowing, 85% narrowing of prox small OM1 (Dr. Jonette Eva)   CAROTID DOPPLER  07/2011   left subclavian (50-69%); right bulb (0-49%); RICA (normal); left mid-distal CCA (0-49%); left bulb/prox ICA (50-69%); left vertebral with abnormal antegrade flow   CORONARY ARTERY BYPASS GRAFT N/A 05/21/2014   Procedure: CORONARY ARTERY BYPASS GRAFTING (CABG) x  four, using left internal mammary artery and right leg greater saphenous vein harvested endoscopically;  Surgeon: Loreli Slot, MD;  Location: [facility] OR;  Service: Open Heart Surgery;  Laterality: N/A;   INTRAOPERATIVE TRANSESOPHAGEAL ECHOCARDIOGRAM N/A 05/21/2014   Procedure: INTRAOPERATIVE TRANSESOPHAGEAL ECHOCARDIOGRAM;  Surgeon: Loreli Slot, MD;  Location: [facility] OR;  Service: Open Heart Surgery;  Laterality: N/A;   LEFT HEART CATHETERIZATION WITH CORONARY ANGIOGRAM N/A 05/19/2014   Procedure: LEFT HEART CATHETERIZATION WITH CORONARY ANGIOGRAM;  Surgeon: Micheline Chapman, MD;  Location: [facility] CATH LAB;  Service: Cardiovascular;  Laterality: N/A;   NASAL SINUS SURGERY  2010   NM MYOCAR PERF WALL MOTION  09/19/2009   bruce myoview - mild perfusion defect in basal inferior region (infarct/scar), EF 60%, low risk scan   RENAL DOPPLER  10/2011   SMA w/ 70-99% diameter reduction & high grade stenosis; R&L renals w/narrowing and increased  velocities (60-99%), R kidney smaller than L   TRANSTHORACIC ECHOCARDIOGRAM  10/20/2012   EF 55-60%, moderate concentric hypertrophy, ventricular septum thickness increased, calcified MV annulus   VASECTOMY  1963    Family History  Problem Relation Age of Onset   Uterine cancer Mother    Stroke Father    Diabetes Sister    Colon polyps Brother    Heart disease Brother    Colon cancer Neg Hx    Esophageal cancer Neg Hx    Rectal cancer Neg Hx    Stomach cancer Neg Hx     Social History:  reports that he quit smoking about 47 years ago. His smoking use included cigarettes. His smokeless tobacco use includes snuff. He reports current alcohol use of about 3.0 standard drinks of alcohol per week. He reports that he does not use drugs.   Review of Systems   Lipid history: Taking simvastatin 40 mg, most recent LDL:  This is prescribed by cardiologist    Lab Results  Component Value Date   CHOL 96 04/27/2019   HDL 55.30 04/27/2019   LDLCALC 33 04/27/2019   LDLDIRECT 45.0 04/17/2017   TRIG 40.0 04/27/2019   CHOLHDL 2 04/27/2019           Hypertension: Blood pressure is controlled with adding hydralazine and he is also on Lasix and chlorthalidone Lasix was apparently increased by nephrologist   BP Readings from Last 3 Encounters:  11/23/19 (!) 118/52  09/21/19 (!) 122/50  08/05/19 (!) 149/50   Renal function has been as follows He has been followed by nephrologist regularly Ultrasound showed reduced right kidney size  Takes Lasix for edema and this has  been adjusted recently  Lab Results  Component Value Date   CREATININE 2.65 (H) 09/21/2019   CREATININE 2.06 (H) 07/20/2019   CREATININE 1.70 (H) 04/27/2019     Most recent eye exam was 1/21  Most recent foot exam: 09/2019   LABS:  Office Visit on 11/23/2019  Component Date Value Ref Range Status   Hemoglobin A1C 11/23/2019 6.0* 4.0 - 5.6 % Final   HM Diabetic Eye Exam 06/26/2019 No Retinopathy   No Retinopathy Final    Physical Examination:  BP (!) 118/52 (BP Location: Left Arm, Patient Position: Sitting, Cuff Size: Normal)    Pulse 71    Wt 161 lb 12.8 oz (73.4 kg)    SpO2 98%    BMI 23.22 kg/m     ASSESSMENT:  Diabetes type 2, insulin requiring  See history of present illness for detailed discussion of current diabetes management, blood sugar patterns and problems identified  [name] is back down to 6%, now was previously 7.3  He is on twice a day premixed insulin along with repaglinide at mealtimes  Most likely he has improved his diet and is getting back on carbohydrates and sweets Also was told to make sure he takes his lunchtime Prandin which he is clinically doing better Overall has not had as much hyperglycemia at dinnertime although blood sugars are variable in the evening [name] also  Weight loss: He thinks this is from excessive Lasix and he thinks he is eating fairly well, also may be from chronic pain   HYPERTENSION : The blood pressure is controlled today    PLAN:   Continue watching high glycemic foods and carbohydrates, discussed  Continue taking repaglinide at lunchtime at the start of the meal Again reminded him to check more readings after breakfast and dinner No change in insulin     There are no Patient Instructions on file for this visit.   Elayne Snare 11/23/2019, 8:31 AM   Note: This office note was prepared with Dragon voice recognition system technology. Any transcriptional errors that result from this process are unintentional.

## 2019-11-23 ENCOUNTER — Other Ambulatory Visit: Payer: Self-pay

## 2019-11-23 ENCOUNTER — Encounter: Payer: Self-pay | Admitting: Endocrinology

## 2019-11-23 ENCOUNTER — Ambulatory Visit (INDEPENDENT_AMBULATORY_CARE_PROVIDER_SITE_OTHER): Payer: Medicare HMO | Admitting: Endocrinology

## 2019-11-23 VITALS — BP 118/52 | HR 71 | Wt 161.8 lb

## 2019-11-23 DIAGNOSIS — Z794 Long term (current) use of insulin: Secondary | ICD-10-CM

## 2019-11-23 DIAGNOSIS — E1165 Type 2 diabetes mellitus with hyperglycemia: Secondary | ICD-10-CM

## 2019-11-23 LAB — POCT GLYCOSYLATED HEMOGLOBIN (HGB A1C): Hemoglobin A1C: 6 % — AB (ref 4.0–5.6)

## 2019-11-25 ENCOUNTER — Other Ambulatory Visit (HOSPITAL_COMMUNITY): Payer: Self-pay | Admitting: *Deleted

## 2019-11-26 ENCOUNTER — Other Ambulatory Visit: Payer: Self-pay

## 2019-11-26 ENCOUNTER — Ambulatory Visit (HOSPITAL_COMMUNITY)
Admission: RE | Admit: 2019-11-26 | Discharge: 2019-11-26 | Disposition: A | Discharge status: Home / Self Care | Payer: Medicare HMO | Source: Ambulatory Visit | Attending: Nephrology | Admitting: Nephrology

## 2019-11-26 DIAGNOSIS — D631 Anemia in chronic kidney disease: Secondary | ICD-10-CM | POA: Insufficient documentation

## 2019-11-26 DIAGNOSIS — N189 Chronic kidney disease, unspecified: Secondary | ICD-10-CM | POA: Insufficient documentation

## 2019-11-26 MED ORDER — SODIUM CHLORIDE 0.9 % IV SOLN
510.0000 mg | INTRAVENOUS | Status: DC
  Administered 2019-11-26: 510 mg via INTRAVENOUS
  Filled 2019-11-26: qty 17

## 2019-12-03 ENCOUNTER — Other Ambulatory Visit: Payer: Self-pay

## 2019-12-03 ENCOUNTER — Ambulatory Visit (HOSPITAL_COMMUNITY)
Admission: RE | Admit: 2019-12-03 | Discharge: 2019-12-03 | Disposition: A | Discharge status: Home / Self Care | Payer: Medicare HMO | Source: Ambulatory Visit | Attending: Nephrology | Admitting: Nephrology

## 2019-12-03 ENCOUNTER — Encounter (HOSPITAL_COMMUNITY): Payer: Medicare HMO

## 2019-12-03 DIAGNOSIS — N184 Chronic kidney disease, stage 4 (severe): Secondary | ICD-10-CM | POA: Diagnosis not present

## 2019-12-03 DIAGNOSIS — D631 Anemia in chronic kidney disease: Secondary | ICD-10-CM | POA: Insufficient documentation

## 2019-12-03 MED ORDER — SODIUM CHLORIDE 0.9 % IV SOLN
510.0000 mg | INTRAVENOUS | Status: DC
  Administered 2019-12-03: 510 mg via INTRAVENOUS
  Filled 2019-12-03: qty 17

## 2019-12-09 DIAGNOSIS — H906 Mixed conductive and sensorineural hearing loss, bilateral: Secondary | ICD-10-CM | POA: Diagnosis not present

## 2019-12-16 ENCOUNTER — Other Ambulatory Visit: Payer: Self-pay | Admitting: Endocrinology

## 2019-12-28 DIAGNOSIS — N401 Enlarged prostate with lower urinary tract symptoms: Secondary | ICD-10-CM | POA: Diagnosis not present

## 2019-12-28 DIAGNOSIS — R3911 Hesitancy of micturition: Secondary | ICD-10-CM | POA: Diagnosis not present

## 2020-01-01 DIAGNOSIS — N189 Chronic kidney disease, unspecified: Secondary | ICD-10-CM | POA: Diagnosis not present

## 2020-01-01 DIAGNOSIS — N183 Chronic kidney disease, stage 3 unspecified: Secondary | ICD-10-CM | POA: Diagnosis not present

## 2020-01-01 DIAGNOSIS — I129 Hypertensive chronic kidney disease with stage 1 through stage 4 chronic kidney disease, or unspecified chronic kidney disease: Secondary | ICD-10-CM | POA: Diagnosis not present

## 2020-01-01 DIAGNOSIS — E1122 Type 2 diabetes mellitus with diabetic chronic kidney disease: Secondary | ICD-10-CM | POA: Diagnosis not present

## 2020-01-01 DIAGNOSIS — N400 Enlarged prostate without lower urinary tract symptoms: Secondary | ICD-10-CM | POA: Diagnosis not present

## 2020-02-01 DIAGNOSIS — N400 Enlarged prostate without lower urinary tract symptoms: Secondary | ICD-10-CM | POA: Diagnosis not present

## 2020-02-01 DIAGNOSIS — E1122 Type 2 diabetes mellitus with diabetic chronic kidney disease: Secondary | ICD-10-CM | POA: Diagnosis not present

## 2020-02-01 DIAGNOSIS — N183 Chronic kidney disease, stage 3 unspecified: Secondary | ICD-10-CM | POA: Diagnosis not present

## 2020-02-01 DIAGNOSIS — D631 Anemia in chronic kidney disease: Secondary | ICD-10-CM | POA: Diagnosis not present

## 2020-02-01 DIAGNOSIS — I129 Hypertensive chronic kidney disease with stage 1 through stage 4 chronic kidney disease, or unspecified chronic kidney disease: Secondary | ICD-10-CM | POA: Diagnosis not present

## 2020-02-14 ENCOUNTER — Other Ambulatory Visit: Payer: Self-pay | Admitting: Endocrinology

## 2020-02-15 ENCOUNTER — Ambulatory Visit (INDEPENDENT_AMBULATORY_CARE_PROVIDER_SITE_OTHER): Payer: Medicare HMO | Admitting: Family Medicine

## 2020-02-15 ENCOUNTER — Encounter: Payer: Self-pay | Admitting: Family Medicine

## 2020-02-15 ENCOUNTER — Other Ambulatory Visit: Payer: Self-pay | Admitting: *Deleted

## 2020-02-15 ENCOUNTER — Telehealth: Payer: Self-pay | Admitting: Family Medicine

## 2020-02-15 ENCOUNTER — Other Ambulatory Visit: Payer: Self-pay

## 2020-02-15 VITALS — BP 156/59 | HR 71 | Temp 98.2°F | Ht 70.0 in | Wt 161.4 lb

## 2020-02-15 DIAGNOSIS — E1159 Type 2 diabetes mellitus with other circulatory complications: Secondary | ICD-10-CM | POA: Diagnosis not present

## 2020-02-15 DIAGNOSIS — I152 Hypertension secondary to endocrine disorders: Secondary | ICD-10-CM

## 2020-02-15 DIAGNOSIS — Z23 Encounter for immunization: Secondary | ICD-10-CM | POA: Diagnosis not present

## 2020-02-15 DIAGNOSIS — E1122 Type 2 diabetes mellitus with diabetic chronic kidney disease: Secondary | ICD-10-CM

## 2020-02-15 DIAGNOSIS — N183 Chronic kidney disease, stage 3 unspecified: Secondary | ICD-10-CM | POA: Diagnosis not present

## 2020-02-15 DIAGNOSIS — I10 Essential (primary) hypertension: Secondary | ICD-10-CM

## 2020-02-15 DIAGNOSIS — M1990 Unspecified osteoarthritis, unspecified site: Secondary | ICD-10-CM

## 2020-02-15 DIAGNOSIS — M5136 Other intervertebral disc degeneration, lumbar region: Secondary | ICD-10-CM | POA: Diagnosis not present

## 2020-02-15 MED ORDER — PREDNISONE 20 MG PO TABS
20.0000 mg | ORAL_TABLET | Freq: Every day | ORAL | 0 refills | Status: DC
Start: 2020-02-15 — End: 2020-02-26

## 2020-02-15 MED ORDER — PREDNISONE 20 MG PO TABS: 20.0000 mg | ORAL_TABLET | Freq: Every day | ORAL | 0 refills | Status: DC

## 2020-02-15 MED ORDER — METHYLPREDNISOLONE ACETATE 80 MG/ML IJ SUSP
80.0000 mg | Freq: Once | INTRAMUSCULAR | Status: AC
  Administered 2020-02-15: 80 mg via INTRAMUSCULAR

## 2020-02-15 NOTE — Telephone Encounter (Signed)
Rx cancelled at [facility] to [facility] [address]  Pt daughter notified

## 2020-02-15 NOTE — Progress Notes (Signed)
   Justin Salinas is a 79 y.o. male who presents today for an office visit.  Assessment/Plan:  Chronic Problems Addressed Today: Degenerative disc disease at L5-S1 level We will start prednisone burst due to acute flare.  Also place referral for him to follow-up with neurosurgery.  CKD stage 3 due to type 2 diabetes mellitus ([place]) Will avoid NSAIDs.   Hypertension associated with diabetes ([place]) Above goal in setting of acute pain.  Typically well controlled.  Continue losartan 100 mg daily, Norvasc 10 mg daily, hydralazine 25 mg 3 times daily, and chlorthalidone 25 mg daily.     Subjective:  HPI:  Patient here with flareup of low back pain.  Radiates into posterior left leg.  Had something similar about 5 months ago and was treated with prednisone.  He has a history of degenerative disc disease with fusion about 15 years ago.  No reported bowel or bladder incontinence.  No reported weakness or numbness.        Objective:  Physical Exam: BP (!) 156/59   Pulse 71   Temp 98.2 F (36.8 C) (Temporal)   Ht 5\' 10"  (1.778 m)   Wt 161 lb 6.4 oz (73.2 kg)   SpO2 98%   BMI 23.16 kg/m   Gen: No acute distress, resting comfortably MSK: -Back: No deformities -Lower extremities: No deformity.  Strength out of 5 throughout.  Neurovascular intact distally. Neuro: Grossly normal, moves all extremities Psych: Normal affect and thought content      [name] M. [name], MD 02/15/2020 12:05 PM

## 2020-02-15 NOTE — Patient Instructions (Signed)
.  It was very nice to see you today!  We will give you a cortisone shot.  Please start the prednisone tomorrow.  I will place a referral for you to see the back surgeon.  Please let me know if your symptoms are not proving over the next several days.  Take care, Dr [name]  Please try these tips to maintain a healthy lifestyle:   Eat at least 3 REAL meals and 1-2 snacks per day.  Aim for no more than 5 hours between eating.  If you eat breakfast, please do so within one hour of getting up.    Each meal should contain half fruits/vegetables, one quarter protein, and one quarter carbs (no bigger than a computer mouse)   Cut down on sweet beverages. This includes juice, soda, and sweet tea.     Drink at least 1 glass of water with each meal and aim for at least 8 glasses per day   Exercise at least 150 minutes every week.

## 2020-02-15 NOTE — Assessment & Plan Note (Signed)
Above goal in setting of acute pain.  Typically well controlled.  Continue losartan 100 mg daily, Norvasc 10 mg daily, hydralazine 25 mg 3 times daily, and chlorthalidone 25 mg daily.

## 2020-02-15 NOTE — Assessment & Plan Note (Signed)
Will avoid NSAIDs.

## 2020-02-15 NOTE — Assessment & Plan Note (Signed)
We will start prednisone burst due to acute flare.  Also place referral for him to follow-up with neurosurgery.

## 2020-02-15 NOTE — Addendum Note (Signed)
Addended by: Betti Cruz on: 02/15/2020 12:16 PM   Modules accepted: Orders

## 2020-02-15 NOTE — Telephone Encounter (Signed)
Pt daughter called asking if prednisone could be sent to [facility] [address] at [address] of [address]. Please advise.

## 2020-02-22 ENCOUNTER — Telehealth: Payer: Self-pay

## 2020-02-22 NOTE — Telephone Encounter (Signed)
Pt is still having pain in his left hip. States injection didn't do the trick. Would like advice on what to do next

## 2020-02-22 NOTE — Telephone Encounter (Signed)
Recommend referral to sports medicine.  Justin Salinas. [name], MD 02/22/2020 2:41 PM

## 2020-02-22 NOTE — Telephone Encounter (Signed)
Please advise 

## 2020-02-22 NOTE — Progress Notes (Signed)
Patient ID: Justin Salinas, male   DOB: [birth year], 79 y.o.   MRN: [id number]           Reason for Appointment: Follow-up for Type 2 Diabetes    History of Present Illness:          Date of diagnosis of type 2 diabetes mellitus:2000        Background history:   His diabetes had been mild initially and treated with metformin He had subsequently other medications added including Amaryl and Januvia No details are available from PCP office, [name] in 2015 was 7.6  Because of his poor control was started on insulin on 01/03/17. He has been on Actos since 06/2017+ He had a baseline A1c A1c 9.7 on his initial consultation in 01/2017  Recent history:   Insulin regimen: NovoLog mix, 10 units at breakfast and 8 at supper Prandin 1 mg at breakfast and lunch daily  A1c is lower than expected at 5.7  Current management, blood sugar patterns and problems identified:   He is still checking his blood sugars only before breakfast and dinnertime despite reminders to change the times when he checks them  He was supposed to take Prandin only at breakfast and lunch and he is doing it at breakfast and dinnertime  Generally blood sugars are higher before dinnertime  Usually FASTING blood sugars are excellent  He had prednisone for 5 days on 9/13 and this increased his blood sugars at dinnertime significantly  However most of his afternoon blood sugars are still over the usual range yesterday  No hypoglycemic symptoms, lowest blood sugar 88 in the mornings  Not able to do any physical activity because of back and sciatica pain  He says his appetite is fairly good, today had 2 servings of carbohydrate including grits and biscuit and his blood sugar is 135 after eating Checking blood sugars regularly twice a day  Side effects from medications have been: None  Compliance with the medical regimen: Fair  Glucose monitoring:  done 2 times a day         Glucometer:  Accu-Chek guide me .       Blood Glucose readings by download of monitor:    PRE-MEAL Fasting Lunch Dinner Bedtime Overall  Glucose range:  88-148   120-277    Mean/median:  115   206   159   POST-MEAL PC Breakfast PC Lunch PC Dinner  Glucose range:  ? ?  Mean/median:      Previously:  PRE-MEAL Fasting Lunch Dinner Bedtime Overall  Glucose range:  93-133   111-228    Mean/median:  115   166   139     Self-care: The diet that the patient has been following is: tries to limit sweets, fried food       Typical meal intake: Breakfast is eggs,  Oatmeal/ meat, toast.  Lunch: Sandwich or.Crackers with or without peanut butter usually                Dietician visit, most recent: 03/2017   Weight history:   Wt Readings from Last 3 Encounters:  02/23/20 165 lb (74.8 kg)  02/15/20 161 lb 6.4 oz (73.2 kg)  12/03/19 165 lb (74.8 kg)    Glycemic control:   Lab Results  Component Value Date   HGBA1C 5.7 (A) 02/23/2020   HGBA1C 6.0 (A) 11/23/2019   HGBA1C 7.3 (H) 07/20/2019   Lab Results  Component Value Date   MICROALBUR <0.7 04/27/2019  LDLCALC 33 04/27/2019   CREATININE 2.65 (H) 09/21/2019   Lab Results  Component Value Date   MICRALBCREAT 0.6 04/27/2019   Microalbumin ratio 9 in 12/2015  Lab Results  Component Value Date   FRUCTOSAMINE 352 (H) 09/21/2019   FRUCTOSAMINE 321 (H) 04/27/2019   FRUCTOSAMINE 334 (H) 10/14/2018      Allergies as of 02/23/2020      Reactions   Lasix [furosemide] Other (See Comments)   Constipation      Medication List       Accurate as of February 23, 2020  8:47 AM. If you have any questions, ask your nurse or doctor.        Accu-Chek Softclix Lancet Dev Kit   alfuzosin 10 MG 24 hr tablet Commonly known as: UROXATRAL Take 10 mg by mouth daily with breakfast.   amLODipine 10 MG tablet Commonly known as: NORVASC Take 1 tablet (10 mg total) by mouth daily.   aspirin 81 MG tablet Take 81 mg by mouth daily.   CENTRUM SILVER ADULT 50+ PO Take  by mouth.   Droplet Pen Needles 31G X 5 MM Misc Generic drug: Insulin Pen Needle USE AS DIRECTED TWICE DAILY   glucose blood test strip Commonly known as: Accu-Chek Guide Use to check blood sugars 2 times daily   Accu-Chek Guide test strip Generic drug: glucose blood TEST  BLOOD  SUGARS TWICE DAILY   hydrALAZINE 50 MG tablet Commonly known as: APRESOLINE Take 1 tablet (50 mg total) by mouth 3 (three) times daily.   ipratropium 0.06 % nasal spray Commonly known as: ATROVENT Place 2 sprays into both nostrils 4 (four) times daily.   losartan 100 MG tablet Commonly known as: COZAAR Take 1 tablet (100 mg total) by mouth daily. Pt must keep appt in Jan, 2021 for future refills What changed: additional instructions   NovoLOG Mix 70/30 FlexPen (70-30) 100 UNIT/ML FlexPen Generic drug: insulin aspart protamine - aspart INJECT 10 UNITS UNDER THE SKIN BEFORE BREAKFAST AND 8 UNITS AT DINNER (PEN EXPIRES 14 DAYS AFTER OPENING)   predniSONE 20 MG tablet Commonly known as: DELTASONE Take 1 tablet (20 mg total) by mouth daily with breakfast.   predniSONE 20 MG tablet Commonly known as: DELTASONE Take 1 tablet (20 mg total) by mouth daily with breakfast.   repaglinide 1 MG tablet Commonly known as: PRANDIN TAKE 1 TABLET (1 MG TOTAL) BY MOUTH 2 (TWO) TIMES DAILY BEFORE A MEAL.   simvastatin 40 MG tablet Commonly known as: ZOCOR Take 1 tablet (40 mg total) by mouth daily at 6 PM.   TRUEplus Lancets 28G Misc TEST BLOOD SUGAR TWICE DAILY   vitamin C 1000 MG tablet Take 1,000 mg by mouth daily.       Allergies:  Allergies  Allergen Reactions  . Lasix [Furosemide] Other (See Comments)    Constipation    Past Medical History:  Diagnosis Date  . Allergy   . Anemia   . Asthma   . BPH (benign prostatic hyperplasia)   . CAD (coronary artery disease)    a. s/p MI 09/1993;  b. known CTO of RCA;  c. 06/2014 Cath: LM short/mod dzs, LAD 95ost, LCX nl, RI nl, RCA 100, EF 55%;  c.  06/2014 s/p CABG x 4: LIMA->LAD, VG->D1, VG->OM1, VG->Acute Marginal.  . Carotid stenosis    a. Carotid US 9/83:  RICA 38-25%; LICA 05-39% >> FU 1 year  . CKD (chronic kidney disease), stage III   . Constipation  in the past 2-3 weeks  . Diabetes ([place])    AODM  . Diastolic dysfunction    a. 05/2015 Echo: EF 60-65%, LVH, Gr 1 DD, mild MR, mod dil RA/LA, mod TR, PASP 38mmHg.  . Essential hypertension   . GERD (gastroesophageal reflux disease)   . Hyperlipidemia     Past Surgical History:  Procedure Laterality Date  . ANGIOPLASTY  09/22/1993   POBA of RCA (Dr. Marella Chimes)  . BACK SURGERY  2000   back fusion - Dr. Earnie Larsson  . CARDIAC CATHETERIZATION  11/28/1998   "silent occlusion" of RCA w/collaterals from left coronary system, prox LAD w/40-50% eccentric narrowing, 1st diagonal with 70-80% eccentric narrowing, 85% narrowing of prox small OM1 (Dr. Marella Chimes)  . CAROTID DOPPLER  07/2011   left subclavian (50-69%); right bulb (0-49%); RICA (normal); left mid-distal CCA (0-49%); left bulb/prox ICA (50-69%); left vertebral with abnormal antegrade flow  . CORONARY ARTERY BYPASS GRAFT N/A 05/21/2014   Procedure: CORONARY ARTERY BYPASS GRAFTING (CABG) x  four, using left internal mammary artery and right leg greater saphenous vein harvested endoscopically;  Surgeon: Melrose Nakayama, MD;  Location: [place];  Service: Open Heart Surgery;  Laterality: N/A;  . INTRAOPERATIVE TRANSESOPHAGEAL ECHOCARDIOGRAM N/A 05/21/2014   Procedure: INTRAOPERATIVE TRANSESOPHAGEAL ECHOCARDIOGRAM;  Surgeon: Melrose Nakayama, MD;  Location: [facility];  Service: Open Heart Surgery;  Laterality: N/A;  . LEFT HEART CATHETERIZATION WITH CORONARY ANGIOGRAM N/A 05/19/2014   Procedure: LEFT HEART CATHETERIZATION WITH CORONARY ANGIOGRAM;  Surgeon: Blane Ohara, MD;  [facility];  Service: Cardiovascular;  Laterality: N/A;  . NASAL SINUS SURGERY  2010  . NM MYOCAR PERF WALL MOTION  09/19/2009   bruce  myoview - mild perfusion defect in basal inferior region (infarct/scar), EF 60%, low risk scan  . RENAL DOPPLER  10/2011   SMA w/ 70-99% diameter reduction & high grade stenosis; R&L renals w/narrowing and increased velocities (60-99%), R kidney smaller than L  . TRANSTHORACIC ECHOCARDIOGRAM  10/20/2012   EF 55-60%, moderate concentric hypertrophy, ventricular septum thickness increased, calcified MV annulus  . VASECTOMY  1963    Family History  Problem Relation Age of Onset  . Uterine cancer Mother   . Stroke Father   . Diabetes Sister   . Colon polyps Brother   . Heart disease Brother   . Colon cancer Neg Hx   . Esophageal cancer Neg Hx   . Rectal cancer Neg Hx   . Stomach cancer Neg Hx     Social History:  reports that he quit smoking about 47 years ago. His smoking use included cigarettes. His smokeless tobacco use includes snuff. He reports current alcohol use of about 3.0 standard drinks of alcohol per week. He reports that he does not use drugs.   Review of Systems   Lipid history: Taking simvastatin 40 mg, most recent LDL:  This is prescribed by cardiologist    Lab Results  Component Value Date   CHOL 96 04/27/2019   HDL 55.30 04/27/2019   LDLCALC 33 04/27/2019   LDLDIRECT 45.0 04/17/2017   TRIG 40.0 04/27/2019   CHOLHDL 2 04/27/2019           Hypertension: Blood pressure is treated with hydralazine, losartan and he is also on Lasix and amlodipine    BP Readings from Last 3 Encounters:  02/23/20 (!) 156/68  02/15/20 (!) 156/59  12/03/19 (!) 166/60   Renal function has been as follows He has been followed by  nephrologist regularly, last creatinine done in August was 1.65 with the nephrologist  Ultrasound showed reduced right kidney size  Takes Lasix for edema, no recent problems with swelling  Lab Results  Component Value Date   CREATININE 2.65 (H) 09/21/2019   CREATININE 2.06 (H) 07/20/2019   CREATININE 1.70 (H) 04/27/2019     Most recent eye exam  was 1/21  Most recent foot exam: 9/21   LABS:  Office Visit on 02/23/2020  Component Date Value Ref Range Status  . Hemoglobin A1C 02/23/2020 5.7* 4.0 - 5.6 % Final  . Glucose Fasting, POC 02/23/2020 133* 70 - 99 mg/dL Final    Physical Examination:  BP (!) 156/68   Pulse 83   Ht $R'5\' 10"'Cj$  (1.778 m)   Wt 165 lb (74.8 kg)   SpO2 97%   BMI 23.68 kg/m    Diabetic Foot Exam - Simple   Simple Foot Form Diabetic Foot exam was performed with the following findings: Yes 02/23/2020  8:29 AM  Visual Inspection No deformities, no ulcerations, no other skin breakdown bilaterally: Yes Sensation Testing Intact to touch and monofilament testing bilaterally: Yes See comments: Yes Pulse Check Posterior Tibialis and Dorsalis pulse intact bilaterally: Yes Comments Mild decrease in sensation on the left great toe    No ankle edema  ASSESSMENT:  Diabetes type 2, insulin requiring  See history of present illness for detailed discussion of current diabetes management, blood sugar patterns and problems identified  [name] is back down to 6%, now was previously 7.3  He is on twice a day premixed insulin along with repaglinide at mealtimes  Most likely he has improved his diet and is getting back on carbohydrates and sweets Also was told to make sure he takes his lunchtime Prandin which he is clinically doing better Overall has not had as much hyperglycemia at dinnertime although blood sugars are variable in the evening [name] also  Hypercholesterolemia: Needs follow-up labs and will check lipid panel, currently on 40 mg of Zocor  HYPERTENSION : The blood pressure is relatively higher today, he will need to follow-up with his PCP and cardiologist    PLAN:   Discussed need to check blood sugars at different times of the day instead of just before breakfast and dinner, given guidelines on when to check blood sugars and expected targets  Recheck fructosamine to correlate with  A1c as A1c may be falsely low because of renal function disturbance and anemia  Continue taking repaglinide at breakfast but also start taking it before lunch at the start of the meal He will take it only at dinnertime if postprandial readings are over 180  If he has prednisone again he will need to increase his morning dose by at least 4 units until blood sugars are improved  No change in current regular dose of 10 units in the morning and 8 units before dinner and reminded him to take the insulin before eating  Resume walking when he is able to  Limit high fat intake   Patient Instructions  repaglinide take before [facility] AND LUNCH  Take before supper ONLY if sugar at 7-8 pm is over 180 consistently  Check blood sugars on waking up days a week  Also check blood sugars about 2 hours after meals and do this after different meals by rotation  Recommended blood sugar levels on waking up are 90-130 and about 2 hours after meal is 130-180  Please bring your blood sugar monitor to each visit,  thank you  If on prednisone increase am insulin to 14 till sugar down    Elayne Snare 02/23/2020, 8:47 AM   Note: This office note was prepared with Dragon voice recognition system technology. Any transcriptional errors that result from this process are unintentional.

## 2020-02-23 ENCOUNTER — Ambulatory Visit: Payer: Medicare HMO | Admitting: Endocrinology

## 2020-02-23 ENCOUNTER — Other Ambulatory Visit: Payer: Self-pay

## 2020-02-23 VITALS — BP 156/68 | HR 83 | Ht 70.0 in | Wt 165.0 lb

## 2020-02-23 DIAGNOSIS — E782 Mixed hyperlipidemia: Secondary | ICD-10-CM | POA: Diagnosis not present

## 2020-02-23 DIAGNOSIS — E1165 Type 2 diabetes mellitus with hyperglycemia: Secondary | ICD-10-CM

## 2020-02-23 DIAGNOSIS — E1142 Type 2 diabetes mellitus with diabetic polyneuropathy: Secondary | ICD-10-CM | POA: Diagnosis not present

## 2020-02-23 DIAGNOSIS — M25552 Pain in left hip: Secondary | ICD-10-CM

## 2020-02-23 DIAGNOSIS — Z794 Long term (current) use of insulin: Secondary | ICD-10-CM | POA: Diagnosis not present

## 2020-02-23 LAB — LIPID PANEL
Cholesterol: 139 mg/dL (ref 0–200)
HDL: 60.6 mg/dL (ref >39.00)
LDL Cholesterol: 65 mg/dL (ref 0–99)
NonHDL: 78.19
Total CHOL/HDL Ratio: 2
Triglycerides: 65 mg/dL (ref 0.0–149.0)
VLDL: 13 mg/dL (ref 0.0–40.0)

## 2020-02-23 LAB — POCT GLUCOSE (DEVICE FOR HOME USE): Glucose Fasting, POC: 133 mg/dL — AB (ref 70–99)

## 2020-02-23 LAB — COMPREHENSIVE METABOLIC PANEL
ALT: 16 U/L (ref 0–53)
AST: 16 U/L (ref 0–37)
Albumin: 4.2 g/dL (ref 3.5–5.2)
Alkaline Phosphatase: 73 U/L (ref 39–117)
BUN: 32 mg/dL — ABNORMAL HIGH (ref 6–23)
CO2: 29 mEq/L (ref 19–32)
Calcium: 9.4 mg/dL (ref 8.4–10.5)
Chloride: 105 mEq/L (ref 96–112)
Creatinine, Ser: 1.69 mg/dL — ABNORMAL HIGH (ref 0.40–1.50)
GFR: 39.09 mL/min — ABNORMAL LOW (ref >60.00)
Glucose, Bld: 117 mg/dL — ABNORMAL HIGH (ref 70–99)
Potassium: 4.8 mEq/L (ref 3.5–5.1)
Sodium: 140 mEq/L (ref 135–145)
Total Bilirubin: 0.5 mg/dL (ref 0.2–1.2)
Total Protein: 6.8 g/dL (ref 6.0–8.3)

## 2020-02-23 LAB — POCT GLYCOSYLATED HEMOGLOBIN (HGB A1C): Hemoglobin A1C: 5.7 % — AB (ref 4.0–5.6)

## 2020-02-23 MED ORDER — REPAGLINIDE 1 MG PO TABS: ORAL_TABLET | ORAL | 2 refills | Status: DC

## 2020-02-23 NOTE — Telephone Encounter (Signed)
Referral has been placed. 

## 2020-02-23 NOTE — Patient Instructions (Signed)
repaglinide take before [facility] AND LUNCH  Take before supper ONLY if sugar at 7-8 pm is over 180 consistently  Check blood sugars on waking up days a week  Also check blood sugars about 2 hours after meals and do this after different meals by rotation  Recommended blood sugar levels on waking up are 90-130 and about 2 hours after meal is 130-180  Please bring your blood sugar monitor to each visit, thank you  If on prednisone increase am insulin to 14 till sugar down

## 2020-02-24 LAB — FRUCTOSAMINE: Fructosamine: 340 umol/L — ABNORMAL HIGH (ref 0–285)

## 2020-02-26 ENCOUNTER — Ambulatory Visit (INDEPENDENT_AMBULATORY_CARE_PROVIDER_SITE_OTHER): Payer: Medicare HMO

## 2020-02-26 ENCOUNTER — Other Ambulatory Visit: Payer: Self-pay

## 2020-02-26 ENCOUNTER — Encounter: Payer: Self-pay | Admitting: Family Medicine

## 2020-02-26 ENCOUNTER — Ambulatory Visit: Payer: Medicare HMO | Admitting: Family Medicine

## 2020-02-26 VITALS — BP 180/70 | HR 71 | Ht 70.0 in | Wt 161.4 lb

## 2020-02-26 DIAGNOSIS — M5136 Other intervertebral disc degeneration, lumbar region: Secondary | ICD-10-CM | POA: Diagnosis not present

## 2020-02-26 DIAGNOSIS — Z9621 Cochlear implant status: Secondary | ICD-10-CM

## 2020-02-26 DIAGNOSIS — E1159 Type 2 diabetes mellitus with other circulatory complications: Secondary | ICD-10-CM | POA: Diagnosis not present

## 2020-02-26 DIAGNOSIS — M5416 Radiculopathy, lumbar region: Secondary | ICD-10-CM

## 2020-02-26 DIAGNOSIS — I152 Hypertension secondary to endocrine disorders: Secondary | ICD-10-CM

## 2020-02-26 DIAGNOSIS — I700 Atherosclerosis of aorta: Secondary | ICD-10-CM | POA: Diagnosis not present

## 2020-02-26 DIAGNOSIS — I10 Essential (primary) hypertension: Secondary | ICD-10-CM

## 2020-02-26 DIAGNOSIS — M47816 Spondylosis without myelopathy or radiculopathy, lumbar region: Secondary | ICD-10-CM | POA: Diagnosis not present

## 2020-02-26 HISTORY — DX: Atherosclerosis of aorta: I70.0

## 2020-02-26 MED ORDER — GABAPENTIN 100 MG PO CAPS: 100.0000 mg | ORAL_CAPSULE | Freq: Every evening | ORAL | 3 refills | Status: DC | PRN

## 2020-02-26 NOTE — Patient Instructions (Addendum)
Thank you for coming in today.  Please get an Xray today before you leave  Plan for Physical therapy.   I will work on getting an MRI to plan for possible injections in your back.   Try gabapentin at bedtime as needed for nerve pain.   Recheck with me after the MRI.   See if you can get the information about your cochlear implant to me ASAP.

## 2020-02-26 NOTE — Progress Notes (Signed)
Subjective:    CC: L hip and Low back pain  I, Molly Weber, LAT, ATC, am serving as scribe for Dr. Lynne Leader.  HPI: Pt is an 79 y/o male presenting w/ c/o low back pain and L hip pain.  He was most recently seen by his PCP for LBP and radiating pain into his L posterior LE and was prescribed prednisone and referred to neurosurgery.  He has a hx of a lumbar fusion (L5-S1) in 2000 Dr Annette Stable.  Today, he notes pain radiating down the left leg to the anterior medial lower leg.  No weakness.  No bowel or bladder dysfunction.  Pain worsening not improving with prednisone.    Radiating pain: yes into his L thigh but will extend into his L lower leg at times LE numbness/tingling: No LE weakness: No Aggravating factors: standing; walking Treatments tried: prednisone; Tylenol;   Diagnostic imaging: L-spine XR- 09/26/99; 07/17/99  Pertinent review of Systems: No fevers or chills  Relevant historical information: Prior lumbar fusion about 20 years ago.  Right-sided bone-anchored hearing aid implant last year.  Baha 5 system according to information should be MRI safe.   Objective:    Vitals:   02/26/20 0858  BP: (!) 180/70  Pulse: 71  SpO2: 97%   General: Well Developed, well nourished, and in no acute distress.   MSK: L-spine normal-appearing Nontender. Decreased lumbar motion to flexion and extension. Lower extremity strength reflexes and sensation are intact distally.  Lab and Radiology Results X-ray images L-spine obtained today personally and independently interpreted Posterior fixation and fusion at L5-S1.  Surgical hardware appears to be intact.  DDD present throughout lumbar spine most dominant at L2-L3. Abdominal aorta atherosclerosis present. Await formal radiology review    Impression and Recommendations:    Assessment and Plan: 79 y.o. male with left lumbar radiculopathy likely L4 based on dermatomal pattern.  History of fusion at L5-S1 20 years ago with Dr.  Trenton Gammon.  Symptoms are new and progressing.  He has failed early initial conservative management with PCP.  Plan to obtain x-ray and proceed with MRI for epidural steroid injection planning.  Plan also for trial of physical therapy and limited gabapentin.  Of note patient has a right-sided bone-anchored hearing aid implant.  It is the Baha 5 system and according to information on the Internet and should be MRI safe.  Information provided in MRI order.  Please see link for radiographer instruction according to manufacturer.. [email or web address]  Abdominal aorta atherosclerosis seen on x-ray.  Patient already pretty well managed with statin.  Blood pressure is elevated today.  Recommend follow back up with PCP.  Diabetes: Also noted like to avoid steroids given diabetes.  PDMP not reviewed this encounter. Orders Placed This Encounter  Procedures  . DG Lumbar Spine 2-3 Views    Standing Status:   Future    Number of Occurrences:   1    Standing Expiration Date:   02/25/2021    Order Specific Question:   Reason for Exam (SYMPTOM  OR DIAGNOSIS REQUIRED)    Answer:   eval left leg pain likely L4 radic    Order Specific Question:   Preferred imaging location?    Answer:   [facility]    Order Specific Question:   Radiology Contrast Protocol - do NOT remove file path    Answer:   [email or web address]  . Ambulatory referral to Physical Therapy    Referral Priority:   Routine    Referral  Type:   Physical Medicine    Referral Reason:   Specialty Services Required    Requested Specialty:   Physical Therapy   Meds ordered this encounter  Medications  . gabapentin (NEURONTIN) 100 MG capsule    Sig: Take 1-3 capsules (100-300 mg total) by mouth at bedtime as needed.    Dispense:  60 capsule    Refill:  3    Discussed warning signs or  symptoms. Please see discharge instructions. Patient expresses understanding.   The above documentation has been reviewed and is accurate and complete Lynne Leader, M.D.

## 2020-02-29 ENCOUNTER — Telehealth: Payer: Self-pay | Admitting: Family Medicine

## 2020-02-29 NOTE — Telephone Encounter (Signed)
Called patient and gave MD response.  He expressed understanding.

## 2020-02-29 NOTE — Progress Notes (Signed)
Surgical fusion at L5-S1 looks normal.  Otherwise there is worsening arthritis in the low back.

## 2020-02-29 NOTE — Telephone Encounter (Signed)
MRI ordered and should be scheduled soon.  MRI should answer questions about pain down the legs better than x-ray does. Please let us know if you do not hear about scheduling the MRI soon.

## 2020-02-29 NOTE — Telephone Encounter (Signed)
Patient called asking for results from his xray.  Given MD response. He said that he is still having pain down to his hip and legs.   Please advise on how he should continue.

## 2020-03-05 ENCOUNTER — Ambulatory Visit (INDEPENDENT_AMBULATORY_CARE_PROVIDER_SITE_OTHER): Payer: Medicare HMO

## 2020-03-05 ENCOUNTER — Other Ambulatory Visit: Payer: Self-pay

## 2020-03-05 DIAGNOSIS — M48061 Spinal stenosis, lumbar region without neurogenic claudication: Secondary | ICD-10-CM | POA: Diagnosis not present

## 2020-03-05 DIAGNOSIS — M5126 Other intervertebral disc displacement, lumbar region: Secondary | ICD-10-CM

## 2020-03-05 DIAGNOSIS — M5416 Radiculopathy, lumbar region: Secondary | ICD-10-CM

## 2020-03-05 DIAGNOSIS — M545 Low back pain, unspecified: Secondary | ICD-10-CM | POA: Diagnosis not present

## 2020-03-07 ENCOUNTER — Telehealth: Payer: Self-pay | Admitting: Family Medicine

## 2020-03-07 DIAGNOSIS — M5416 Radiculopathy, lumbar region: Secondary | ICD-10-CM

## 2020-03-07 NOTE — Progress Notes (Signed)
MRI lumbar spine shows good postsurgical fusion at L5-S1.  It does show some medium pinched nerve at L4 which is probably causing your symptoms.  There are some pinched nerve changes further up that probably not causing symptoms.  Plan for epidural steroid injection.  I have ordered the injection.  Please call [facility] at [phone] to schedule.  Stop taking aspirin now.  Schedule follow-up appointment with me soon to review the MRI results and discuss the injection.

## 2020-03-07 NOTE — Telephone Encounter (Signed)
Epidural steroid injection ordered 

## 2020-03-09 ENCOUNTER — Other Ambulatory Visit: Payer: Medicare HMO

## 2020-03-09 DIAGNOSIS — M48062 Spinal stenosis, lumbar region with neurogenic claudication: Secondary | ICD-10-CM | POA: Diagnosis not present

## 2020-03-11 ENCOUNTER — Other Ambulatory Visit: Payer: Self-pay

## 2020-03-11 ENCOUNTER — Ambulatory Visit (INDEPENDENT_AMBULATORY_CARE_PROVIDER_SITE_OTHER): Payer: Medicare HMO | Admitting: Family Medicine

## 2020-03-11 ENCOUNTER — Encounter: Payer: Self-pay | Admitting: Family Medicine

## 2020-03-11 VITALS — BP 180/80 | HR 60 | Ht 70.0 in | Wt 160.0 lb

## 2020-03-11 DIAGNOSIS — M5416 Radiculopathy, lumbar region: Secondary | ICD-10-CM

## 2020-03-11 MED ORDER — GABAPENTIN 300 MG PO CAPS: 300.0000 mg | ORAL_CAPSULE | Freq: Three times a day (TID) | ORAL | 1 refills | Status: DC | PRN

## 2020-03-11 NOTE — Patient Instructions (Addendum)
Thank you for coming in today.  OK to increase the gabapentin to 300mg  3x daily for nerve pain.  I will call [facility] to cancel the injection.  Dr Marchelle Folks office will arrange the injection with Dr Nelva Bush.   Talk to Dr [name] about your blood pressure.

## 2020-03-11 NOTE — Progress Notes (Signed)
Justin Salinas, am serving as a Education administrator for Dr. Lynne Leader. Justin Salinas is a 79 y.o. male who presents to [facility] today for f/u of chronic LBP w/ L LE radicular symptoms and lumbar spine MRI review.  He was last seen by Dr. Georgina Snell on 02/26/20 and referred for a lumbar MRI and PT and was prescribed Gabapentin.  He has a hx of an L5-S1 fusion in 2000 performed by Dr. Annette Stable.  Since his last visit, pt reports  That he is doing worse. States he has been dealing with this pain for three weeks and it is not any better. Is scheduled with [facility] Monday for an injection but wants to go to Dr. Irven [place] office to get the injection instead since he has been going there for years and they know his situation   Diagnostic testing: L-spine MRI- 03/05/20; L-spine XR- 02/26/20   Pertinent review of systems: No fevers or chills  Relevant historical information: Hypertension, diabetes, carotid artery disease.  Typically on aspirin 81.  He has been off of aspirin 81 for 7 days now.   Exam:  BP (!) 180/80 (BP Location: Left Arm, Patient Position: Sitting, Cuff Size: Normal)   Pulse 60   Ht 5\' 10"  (1.778 m)   Wt 160 lb (72.6 kg)   SpO2 97%   BMI 22.96 kg/m  General: Well Developed, well nourished, and in no acute distress.   MSK: L-spine forward posture with gait.    Lab and Radiology Results DG Lumbar Spine 2-3 Views  Result Date: 02/26/2020 CLINICAL DATA:  Chronic low back and left leg pain. Likely L4 radiculopathy. EXAM: LUMBAR SPINE - 2-3 VIEW COMPARISON:  Acute abdominal series 04/15/2015 and abdominopelvic CT 04/13/2015. Report only from lumbar spine radiographs 09/25/1999. FINDINGS: Five lumbar type vertebral bodies. Status post laminectomy and PLIF at L5-S1 with grossly stable hardware. The alignment is stable with a chronic anterolisthesis at the fused level. No evidence of acute fracture or pars defect. There is multilevel spondylosis with  progressive loss of disc height at L3-4. Chronic spondylosis at L1-2 and L2-3 is grossly stable. The L4-5 disc space remains largely preserved. A calcified splenic artery aneurysm is again noted, incompletely visualized. Diffuse aortic atherosclerosis noted. IMPRESSION: Progressive degenerative disc disease at L3-4. Stable chronic degenerative changes at L1-2 and L2-3. Stable postsurgical changes at L5-S1. Electronically Signed   By: Richardean Sale M.D.   On: 02/26/2020 15:59   MR Lumbar Spine Wo Contrast  Result Date: 03/05/2020 CLINICAL DATA:  79 year old male with low back pain. Left leg radiculopathy. No known injury. Prior surgery. EXAM: MRI LUMBAR SPINE WITHOUT CONTRAST TECHNIQUE: Multiplanar, multisequence MR imaging of the lumbar spine was performed. No intravenous contrast was administered. COMPARISON:  Lumbar radiographs 02/26/2020. FINDINGS: Segmentation:  Normal on the comparison radiographs. Alignment: Straightening of lumbar lordosis with mild retrolisthesis from L2-L3 through L4-L5. Minor dextroconvex lumbar scoliosis. Vertebrae: Mild hardware susceptibility artifact at L5 and S1. mild degenerative appearing endplate edema at H2-C9 anteriorly. No other acute osseous abnormality identified. Background bone marrow signal mildly heterogeneous but within normal limits. Conus medullaris and cauda equina: Conus extends to the T12-L1 level. No lower spinal cord or conus signal abnormality. Paraspinal and other soft tissues: Atrophied right kidney. Partially visible benign appearing left renal cysts. Otherwise negative. Disc levels: Negative visible lower thoracic levels. T12-L1:  Mild disc bulge without stenosis. L1-L2: Severe disc space loss. Bulky circumferential but anterior and lateral eccentric disc osteophyte complex.  Mild left and moderate right L1 foraminal stenosis without spinal stenosis. L2-L3: Disc space loss. Circumferential disc osteophyte complex. Mild to moderate left L2 foraminal  stenosis without spinal stenosis. L3-L4: Disc desiccation, vacuum disc. Bulky circumferential disc osteophyte complex with mild to moderate posterior element hypertrophy. Degenerative facet joint fluid greater on the left. Mild spinal stenosis. Borderline to mild bilateral lateral recess stenosis (L4 nerve levels). Severe left greater than right L3 foraminal stenosis. L4-L5: Circumferential disc bulge with broad-based posterior component. Mild to moderate facet hypertrophy probable previous ligamentectomy here. No spinal or lateral recess stenosis. Moderate bilateral L4 foraminal stenosis. L5-S1: Prior decompression and fusion with solid interbody arthrodesis. IMPRESSION: 1. Prior decompression and fusion at L5-S1 with solid interbody arthrodesis. 2. Relatively mild adjacent segment disease at L4-L5 although moderate bilateral L4 foraminal stenosis. 3. Bulky degenerative disease at L3-L4 with L3-L4 mild spinal stenosis but severe left greater than right L3 neural foraminal stenosis. 4. Mild to moderate L1 and L2 neural foraminal stenosis. Mild degenerative endplate marrow edema at L1-L2. Electronically Signed   By: Genevie Ann M.D.   On: 03/05/2020 20:41   I, Lynne Leader, personally (independently) visualized and performed the interpretation of the images attached in this note.      Assessment and Plan: 79 y.o. male with lumbar radiculopathy and back pain.  Patient is scheduled for epidural steroid injection on Monday, October 11 with [facility] imaging.  He has been seen by his neurosurgeons office in the interim and wishes to cancel the epidural steroid injection with [facility] imaging on the 11th and proceed with treatment with his neurosurgeon.  I think this is reasonable if he chooses to do so.  As gabapentin 200 mg 3 times daily has not been helpful as much as he would like to increase gabapentin 300 mg up to 3 times daily.  Recheck back with me as needed.   PDMP not reviewed this encounter. No orders  of the defined types were placed in this encounter.  Meds ordered this encounter  Medications  . gabapentin (NEURONTIN) 300 MG capsule    Sig: Take 1 capsule (300 mg total) by mouth 3 (three) times daily as needed (nerve pain).    Dispense:  90 capsule    Refill:  1     Discussed warning signs or symptoms. Please see discharge instructions. Patient expresses understanding.   The above documentation has been reviewed and is accurate and complete Lynne Leader, M.D.

## 2020-03-14 ENCOUNTER — Inpatient Hospital Stay: Admission: RE | Admit: 2020-03-14 | Payer: Medicare HMO | Source: Ambulatory Visit

## 2020-03-16 ENCOUNTER — Other Ambulatory Visit: Payer: Self-pay | Admitting: Endocrinology

## 2020-03-21 DIAGNOSIS — M48062 Spinal stenosis, lumbar region with neurogenic claudication: Secondary | ICD-10-CM | POA: Diagnosis not present

## 2020-03-21 DIAGNOSIS — M5412 Radiculopathy, cervical region: Secondary | ICD-10-CM | POA: Diagnosis not present

## 2020-03-22 ENCOUNTER — Ambulatory Visit: Payer: Medicare HMO | Admitting: Physical Therapy

## 2020-03-25 DIAGNOSIS — I10 Essential (primary) hypertension: Secondary | ICD-10-CM | POA: Diagnosis not present

## 2020-03-25 DIAGNOSIS — M48062 Spinal stenosis, lumbar region with neurogenic claudication: Secondary | ICD-10-CM | POA: Diagnosis not present

## 2020-04-07 ENCOUNTER — Other Ambulatory Visit: Payer: Self-pay | Admitting: Cardiovascular Disease

## 2020-04-13 ENCOUNTER — Other Ambulatory Visit: Payer: Self-pay | Admitting: Cardiovascular Disease

## 2020-04-13 DIAGNOSIS — M48062 Spinal stenosis, lumbar region with neurogenic claudication: Secondary | ICD-10-CM | POA: Diagnosis not present

## 2020-04-13 DIAGNOSIS — I10 Essential (primary) hypertension: Secondary | ICD-10-CM | POA: Diagnosis not present

## 2020-04-25 DIAGNOSIS — N183 Chronic kidney disease, stage 3 unspecified: Secondary | ICD-10-CM | POA: Diagnosis not present

## 2020-04-25 DIAGNOSIS — D631 Anemia in chronic kidney disease: Secondary | ICD-10-CM | POA: Diagnosis not present

## 2020-04-25 DIAGNOSIS — D649 Anemia, unspecified: Secondary | ICD-10-CM | POA: Diagnosis not present

## 2020-04-25 DIAGNOSIS — I129 Hypertensive chronic kidney disease with stage 1 through stage 4 chronic kidney disease, or unspecified chronic kidney disease: Secondary | ICD-10-CM | POA: Diagnosis not present

## 2020-04-25 DIAGNOSIS — N400 Enlarged prostate without lower urinary tract symptoms: Secondary | ICD-10-CM | POA: Diagnosis not present

## 2020-04-25 DIAGNOSIS — E1122 Type 2 diabetes mellitus with diabetic chronic kidney disease: Secondary | ICD-10-CM | POA: Diagnosis not present

## 2020-05-05 ENCOUNTER — Ambulatory Visit: Payer: Medicare HMO | Admitting: Physical Therapy

## 2020-06-07 ENCOUNTER — Telehealth: Payer: Self-pay | Admitting: Endocrinology

## 2020-06-07 ENCOUNTER — Other Ambulatory Visit: Payer: Self-pay | Admitting: Endocrinology

## 2020-06-07 ENCOUNTER — Other Ambulatory Visit: Payer: Self-pay | Admitting: *Deleted

## 2020-06-07 ENCOUNTER — Other Ambulatory Visit: Payer: Self-pay | Admitting: Cardiovascular Disease

## 2020-06-07 MED ORDER — NOVOLOG MIX 70/30 FLEXPEN (70-30) 100 UNIT/ML ~~LOC~~ SUPN: PEN_INJECTOR | SUBCUTANEOUS | 1 refills | Status: DC

## 2020-06-07 NOTE — Telephone Encounter (Signed)
Patient called to request new RX for insulin aspart protamine - aspart (NOVOLOG MIX 70/30 FLEXPEN) (70-30) 100 UNIT/ML FlexPen be sent to  [facility], [place] Phone:  [phone]  Fax:  [phone]

## 2020-06-07 NOTE — Telephone Encounter (Signed)
Rx sent 

## 2020-06-10 ENCOUNTER — Ambulatory Visit (INDEPENDENT_AMBULATORY_CARE_PROVIDER_SITE_OTHER): Payer: Medicare HMO

## 2020-06-10 ENCOUNTER — Other Ambulatory Visit: Payer: Self-pay

## 2020-06-10 VITALS — BP 122/64 | HR 71 | Temp 98.2°F | Resp 20 | Wt 165.8 lb

## 2020-06-10 DIAGNOSIS — Z0000 Encounter for general adult medical examination without abnormal findings: Secondary | ICD-10-CM | POA: Diagnosis not present

## 2020-06-10 NOTE — Patient Instructions (Addendum)
Justin Salinas , Thank you for taking time to come for your Medicare Wellness Visit. I appreciate your ongoing commitment to your health goals. Please review the following plan we discussed and let me know if I can assist you in the future.   Screening recommendations/referrals: Colonoscopy: Done 12/10/16 Recommended yearly ophthalmology/optometry visit for glaucoma screening and checkup Recommended yearly dental visit for hygiene and checkup  Vaccinations: Influenza vaccine: Done 02/15/20 Pneumococcal vaccine: Up to date Tdap vaccine: Up to date Shingles vaccine: Shingrix discussed. Please contact your pharmacy for coverage information.  Covid-19: Completed 2/21 & 08/25/19  Advanced directives: Please bring a copy of your health care power of attorney and living will to the office at your convenience.  Conditions/risks identified: None at this time  Next appointment: Follow up in one year for your annual wellness visit.   Preventive Care 17 Years and Older, Male Preventive care refers to lifestyle choices and visits with your health care provider that can promote health and wellness. What does preventive care include?  A yearly physical exam. This is also called an annual well check.  Dental exams once or twice a year.  Routine eye exams. Ask your health care provider how often you should have your eyes checked.  Personal lifestyle choices, including:  Daily care of your teeth and gums.  Regular physical activity.  Eating a healthy diet.  Avoiding tobacco and drug use.  Limiting alcohol use.  Practicing safe sex.  Taking low doses of aspirin every day.  Taking vitamin and mineral supplements as recommended by your health care provider. What happens during an annual well check? The services and screenings done by your health care provider during your annual well check will depend on your age, overall health, lifestyle risk factors, and family history of disease. Counseling   Your health care provider may ask you questions about your:  Alcohol use.  Tobacco use.  Drug use.  Emotional well-being.  Home and relationship well-being.  Sexual activity.  Eating habits.  History of falls.  Memory and ability to understand (cognition).  Work and work Statistician. Screening  You may have the following tests or measurements:  Height, weight, and BMI.  Blood pressure.  Lipid and cholesterol levels. These may be checked every 5 years, or more frequently if you are over 87 years old.  Skin check.  Lung cancer screening. You may have this screening every year starting at age 21 if you have a 30-pack-year history of smoking and currently smoke or have quit within the past 15 years.  Fecal occult blood test (FOBT) of the stool. You may have this test every year starting at age 57.  Flexible sigmoidoscopy or colonoscopy. You may have a sigmoidoscopy every 5 years or a colonoscopy every 10 years starting at age 71.  Prostate cancer screening. Recommendations will vary depending on your family history and other risks.  Hepatitis C blood test.  Hepatitis B blood test.  Sexually transmitted disease (STD) testing.  Diabetes screening. This is done by checking your blood sugar (glucose) after you have not eaten for a while (fasting). You may have this done every 1-3 years.  Abdominal aortic aneurysm (AAA) screening. You may need this if you are a current or former smoker.  Osteoporosis. You may be screened starting at age 47 if you are at high risk. Talk with your health care provider about your test results, treatment options, and if necessary, the need for more tests. Vaccines  Your health care provider  may recommend certain vaccines, such as:  Influenza vaccine. This is recommended every year.  Tetanus, diphtheria, and acellular pertussis (Tdap, Td) vaccine. You may need a Td booster every 10 years.  Zoster vaccine. You may need this after age  63.  Pneumococcal 13-valent conjugate (PCV13) vaccine. One dose is recommended after age 70.  Pneumococcal polysaccharide (PPSV23) vaccine. One dose is recommended after age 62. Talk to your health care provider about which screenings and vaccines you need and how often you need them. This information is not intended to replace advice given to you by your health care provider. Make sure you discuss any questions you have with your health care provider. Document Released: 06/17/2015 Document Revised: 02/08/2016 Document Reviewed: 03/22/2015 Elsevier Interactive Patient Education  2017 [place] Prevention in the Home Falls can cause injuries. They can happen to people of all ages. There are many things you can do to make your home safe and to help prevent falls. What can I do on the outside of my home?  Regularly fix the edges of walkways and driveways and fix any cracks.  Remove anything that might make you trip as you walk through a door, such as a raised step or threshold.  Trim any bushes or trees on the path to your home.  Use bright outdoor lighting.  Clear any walking paths of anything that might make someone trip, such as rocks or tools.  Regularly check to see if handrails are loose or broken. Make sure that both sides of any steps have handrails.  Any raised decks and porches should have guardrails on the edges.  Have any leaves, snow, or ice cleared regularly.  Use sand or salt on walking paths during winter.  Clean up any spills in your garage right away. This includes oil or grease spills. What can I do in the bathroom?  Use night lights.  Install grab bars by the toilet and in the tub and shower. Do not use towel bars as grab bars.  Use non-skid mats or decals in the tub or shower.  If you need to sit down in the shower, use a plastic, non-slip stool.  Keep the floor dry. Clean up any water that spills on the floor as soon as it happens.  Remove  soap buildup in the tub or shower regularly.  Attach bath mats securely with double-sided non-slip rug tape.  Do not have throw rugs and other things on the floor that can make you trip. What can I do in the bedroom?  Use night lights.  Make sure that you have a light by your bed that is easy to reach.  Do not use any sheets or blankets that are too big for your bed. They should not hang down onto the floor.  Have a firm chair that has side arms. You can use this for support while you get dressed.  Do not have throw rugs and other things on the floor that can make you trip. What can I do in the kitchen?  Clean up any spills right away.  Avoid walking on wet floors.  Keep items that you use a lot in easy-to-reach places.  If you need to reach something above you, use a strong step stool that has a grab bar.  Keep electrical cords out of the way.  Do not use floor polish or wax that makes floors slippery. If you must use wax, use non-skid floor wax.  Do not have throw rugs  and other things on the floor that can make you trip. What can I do with my stairs?  Do not leave any items on the stairs.  Make sure that there are handrails on both sides of the stairs and use them. Fix handrails that are broken or loose. Make sure that handrails are as long as the stairways.  Check any carpeting to make sure that it is firmly attached to the stairs. Fix any carpet that is loose or worn.  Avoid having throw rugs at the top or bottom of the stairs. If you do have throw rugs, attach them to the floor with carpet tape.  Make sure that you have a light switch at the top of the stairs and the bottom of the stairs. If you do not have them, ask someone to add them for you. What else can I do to help prevent falls?  Wear shoes that:  Do not have high heels.  Have rubber bottoms.  Are comfortable and fit you well.  Are closed at the toe. Do not wear sandals.  If you use a  stepladder:  Make sure that it is fully opened. Do not climb a closed stepladder.  Make sure that both sides of the stepladder are locked into place.  Ask someone to hold it for you, if possible.  Clearly mark and make sure that you can see:  Any grab bars or handrails.  First and last steps.  Where the edge of each step is.  Use tools that help you move around (mobility aids) if they are needed. These include:  Canes.  Walkers.  Scooters.  Crutches.  Turn on the lights when you go into a dark area. Replace any light bulbs as soon as they burn out.  Set up your furniture so you have a clear path. Avoid moving your furniture around.  If any of your floors are uneven, fix them.  If there are any pets around you, be aware of where they are.  Review your medicines with your doctor. Some medicines can make you feel dizzy. This can increase your chance of falling. Ask your doctor what other things that you can do to help prevent falls. This information is not intended to replace advice given to you by your health care provider. Make sure you discuss any questions you have with your health care provider. Document Released: 03/17/2009 Document Revised: 10/27/2015 Document Reviewed: 06/25/2014 Elsevier Interactive Patient Education  2017 [employer].

## 2020-06-10 NOTE — Progress Notes (Signed)
Subjective:   Justin Salinas is a 80 y.o. male who presents for Medicare Annual/Subsequent preventive examination.  Review of Systems     Cardiac Risk Factors include: advanced age (>32mn, >>9women);diabetes mellitus;dyslipidemia;male gender;hypertension     Objective:    Today's Vitals   06/10/20 0830  BP: (!) 142/60  Pulse: 71  Resp: 20  Temp: 98.2 F (36.8 C)  SpO2: 98%  Weight: 165 lb 12.8 oz (75.2 kg)   Body mass index is 23.79 kg/m.  Advanced Directives 06/10/2020 04/24/2019 03/29/2017 11/28/2016 04/15/2015 04/13/2015 05/20/2014  Does Patient Have a Medical Advance Directive? Yes Yes Yes Yes No Yes Yes  Type of [place] will Living will;Healthcare Power of A[place]vinLiving will - Living will Living will;Healthcare Power of Attorney  Does patient want to make changes to medical advance directive? - No - Patient declined - - - Yes - information given No - Patient declined  Copy of [place] Chart? No - copy requested No - copy requested - - - No - copy requested Yes    Current Medications (verified) Outpatient Encounter Medications as of 06/10/2020  Medication Sig  . ACCU-CHEK GUIDE test strip TEST  BLOOD  SUGARS TWICE DAILY  . alfuzosin (UROXATRAL) 10 MG 24 hr tablet Take 10 mg by mouth daily with breakfast.  . amLODipine (NORVASC) 10 MG tablet Take 1 tablet (10 mg total) by mouth daily.  . Ascorbic Acid (VITAMIN C) 1000 MG tablet Take 1,000 mg by mouth daily.  .Marland Kitchenaspirin 81 MG tablet Take 81 mg by mouth daily.  . DROPLET PEN NEEDLES 31G X 5 MM MISC USE AS DIRECTED TWICE DAILY  . glucose blood (ACCU-CHEK GUIDE) test strip Use to check blood sugars 2 times daily  . hydrALAZINE (APRESOLINE) 50 MG tablet Take 1 tablet (50 mg total) by mouth 3 (three) times daily.  . insulin aspart protamine - aspart (NOVOLOG MIX 70/30 FLEXPEN) (70-30) 100 UNIT/ML FlexPen INJECT 10 UNITS UNDER  THE SKIN BEFORE BREAKFAST AND 8 UNITS AT DINNER  . ipratropium (ATROVENT) 0.06 % nasal spray Place 2 sprays into both nostrils 4 (four) times daily.  . Lancets Misc. (ACCU-CHEK SOFTCLIX LANCET DEV) KIT   . losartan (COZAAR) 100 MG tablet Take 1 tablet (100 mg total) by mouth daily.  . Multiple Vitamins-Minerals (CENTRUM SILVER ADULT 50+ PO) Take by mouth.  . repaglinide (PRANDIN) 1 MG tablet TAKE 1 TABLET (1 MG TOTAL) BY MOUTH 2 (TWO) TIMES DAILY BEFORE A MEAL.  . simvastatin (ZOCOR) 40 MG tablet Take 1 tablet (40 mg total) by mouth daily at 6 PM.  . TRUEplus Lancets 28G MISC TEST BLOOD SUGAR TWICE DAILY  . gabapentin (NEURONTIN) 300 MG capsule Take 1 capsule (300 mg total) by mouth 3 (three) times daily as needed (nerve pain). (Patient not taking: Reported on 06/10/2020)   No facility-administered encounter medications on file as of 06/10/2020.    Allergies (verified) Exenatide, Linagliptin, and Lasix [furosemide]   History: Past Medical History:  Diagnosis Date  . Allergy   . Anemia   . Asthma   . Atheros[place] of abdominal aorta [place] 02/26/[place]   Seen on x-ray L-spine September 2021  . BPH (benign prostatic hyperplasia)   . CAD (coronary artery disease)    a. s/p MI 09/1993;  b. known CTO of RCA;  c. 06/2014 Cath: LM short/mod dzs, LAD 95ost, LCX nl, RI nl, RCA 100, EF 55%;  c.  06/2014 s/p CABG x 4: LIMA->LAD, VG->D1, VG->OM1, VG->Acute Marginal.  . Carotid stenosis    a. Carotid US 4/85:  RICA 46-27%; LICA 03-50% >> FU 1 year  . CKD (chronic kidney disease), stage III ([place])   . Constipation    in the past 2-3 weeks  . Diabetes ([place])    AODM  . Diastolic dysfunction    a. 05/2015 Echo: EF 60-65%, LVH, Gr 1 DD, mild MR, mod dil RA/LA, mod TR, PASP 51mHg.  . Essential hypertension   . GERD (gastroesophageal reflux disease)   . Hyperlipidemia    Past Surgical History:  Procedure Laterality Date  . ANGIOPLASTY  09/22/1993   POBA of RCA (Dr. RMarella Chimes  . BACK SURGERY  2000    back fusion - Dr. HEarnie Larsson . CARDIAC CATHETERIZATION  11/28/1998   "silent occlusion" of RCA w/collaterals from left coronary system, prox LAD w/40-50% eccentric narrowing, 1st diagonal with 70-80% eccentric narrowing, 85% narrowing of prox small OM1 (Dr. RMarella Chimes  . CAROTID DOPPLER  07/2011   left subclavian (50-69%); right bulb (0-49%); RICA (normal); left mid-distal CCA (0-49%); left bulb/prox ICA (50-69%); left vertebral with abnormal antegrade flow  . CORONARY ARTERY BYPASS GRAFT N/A 05/21/2014   Procedure: CORONARY ARTERY BYPASS GRAFTING (CABG) x  four, using left internal mammary artery and right leg greater saphenous vein harvested endoscopically;  Surgeon: SMelrose Nakayama MD;  [place]  Service: Open Heart Surgery;  Laterality: N/A;  . INTRAOPERATIVE TRANSESOPHAGEAL ECHOCARDIOGRAM N/A 05/21/2014   Procedure: INTRAOPERATIVE TRANSESOPHAGEAL ECHOCARDIOGRAM;  Surgeon: SMelrose Nakayama MD;  [place] [facility]  Service: Open Heart Surgery;  Laterality: N/A;  . LEFT HEART CATHETERIZATION WITH CORONARY ANGIOGRAM N/A 05/19/2014   Procedure: LEFT HEART CATHETERIZATION WITH CORONARY ANGIOGRAM;  Surgeon: MBlane Ohara MD;  [facility];  Service: Cardiovascular;  Laterality: N/A;  . NASAL SINUS SURGERY  2010  . NM MYOCAR PERF WALL MOTION  09/19/2009   bruce myoview - mild perfusion defect in basal inferior region (infarct/scar), EF 60%, low risk scan  . RENAL DOPPLER  10/2011   SMA w/ 70-99% diameter reduction & high grade stenosis; R&L renals w/narrowing and increased velocities (60-99%), R kidney smaller than L  . TRANSTHORACIC ECHOCARDIOGRAM  10/20/2012   EF 55-60%, moderate concentric hypertrophy, ventricular septum thickness increased, calcified MV annulus  . VASECTOMY  1963   Family History  Problem Relation Age of Onset  . Uterine cancer Mother   . Stroke Father   . Diabetes Sister   . Colon polyps Brother   . Heart disease Brother   . Colon cancer Neg  Hx   . Esophageal cancer Neg Hx   . Rectal cancer Neg Hx   . Stomach cancer Neg Hx    Social History   Socioeconomic History  . Marital status: Widowed    Spouse name: Not on file  . Number of children: 2  . Years of education: Not on file  . Highest education level: Not on file  Occupational History  . Occupation: Retired   Tobacco Use  . Smoking status: Former Smoker    Types: Cigarettes    Quit date: 06/04/1972    Years since quitting: 48.0  . Smokeless tobacco: Current User    Types: Snuff  . Tobacco comment: quit about 40 years  Vaping Use  . Vaping Use: Never used  Substance and Sexual Activity  . Alcohol use: Yes    Alcohol/week: 3.0 standard drinks  Types: 3 Standard drinks or equivalent per week    Comment: a glass of red wine 3 or 4 days a week.  . Drug use: No  . Sexual activity: Not on file  Other Topics Concern  . Not on file  Social History Narrative   Recently got puppy May 2020   Long-term girlfriend of 24 years died Dec 6503; complications from diabetes   Daughter lives nearby and assists as needed    Social Determinants of Radio broadcast assistant Strain: Low Risk   . Difficulty of Paying Living Expenses: Not hard at all  Food Insecurity: No Food Insecurity  . Worried About Charity fundraiser in the Last Year: Never true  . Ran Out of Food in the Last Year: Never true  Transportation Needs: No Transportation Needs  . Lack of Transportation (Medical): No  . Lack of Transportation (Non-Medical): No  Physical Activity: Insufficiently Active  . Days of Exercise per Week: 3 days  . Minutes of Exercise per Session: 30 min  Stress: Stress Concern Present  . Feeling of Stress : To some extent  Social Connections: Moderately Integrated  . Frequency of Communication with Friends and Family: Twice a week  . Frequency of Social Gatherings with Friends and Family: Twice a week  . Attends Religious Services: 1 to 4 times per year  . Active Member of  Clubs or Organizations: Yes  . Attends Archivist Meetings: Never  . Marital Status: Widowed    Tobacco Counseling Ready to quit: Not Answered Counseling given: Not Answered Comment: quit about 40 years   Clinical Intake:  Pre-visit preparation completed: Yes  Pain : No/denies pain     BMI - recorded: 23.79 Nutritional Status: BMI of 19-24  Normal Nutritional Risks: None Diabetes: Yes CBG done?: Yes (107) CBG resulted in Enter/ Edit results?: No Did pt. bring in CBG monitor from home?: No  How often do you need to have someone help you when you read instructions, pamphlets, or other written materials from your doctor or pharmacy?: 1 - Never  Diabetic?Nutrition Risk Assessment:  Has the patient had any N/V/D within the last 2 months?  No  Does the patient have any non-healing wounds?  No  Has the patient had any unintentional weight loss or weight gain?  No   Diabetes:  Is the patient diabetic?  Yes  If diabetic, was a CBG obtained today?  Yes  Did the patient bring in their glucometer from home?  No  How often do you monitor your CBG's? Daily.   Financial Strains and Diabetes Management:  Are you having any financial strains with the device, your supplies or your medication? No .  Does the patient want to be seen by Chronic Care Management for management of their diabetes?  No  Would the patient like to be referred to a Nutritionist or for Diabetic Management?  No   Diabetic Exams:  Diabetic Eye Exam: Completed 06/25/20 Diabetic Foot Exam: Completed 02/23/20   Interpreter Needed?: No  Information entered by :: Charlott Rakes, LPN   Activities of Daily Living In your present state of health, do you have any difficulty performing the following activities: 06/10/2020  Hearing? Y  Comment wears hearing aid in right ear and has an implant in the left  Vision? N  Difficulty concentrating or making decisions? Y  Comment memory at times  Walking or  climbing stairs? N  Dressing or bathing? N  Doing errands, shopping? N  Preparing Food and eating ? N  Using the Toilet? N  Managing your Medications? N  Managing your Finances? N  Housekeeping or managing your Housekeeping? N  Some recent data might be hidden    Patient Care Team: Vivi Barrack, MD as PCP - General (Family Medicine) [name], MD as PCP - Cardiology (Cardiology) Thornell Sartorius, MD as Consulting Physician (Otolaryngology) Elayne Snare, MD as Consulting Physician (Endocrinology) Drake Leach, [place] as Consulting Physician (Optometry) Corliss Parish, MD as Consulting Physician (Nephrology) Festus Aloe, MD as Consulting Physician (Urology)  Indicate any recent Medical Services you may have received from other than [facility] providers in the past year (date may be approximate).     Assessment:   This is a routine wellness examination for [place].  Hearing/Vision screen  Hearing Screening   125Hz 250Hz 500Hz 1000Hz 2000Hz 3000Hz 4000Hz 6000Hz 8000Hz  Right ear:           Left ear:           Comments: Has a hearing implant in right eaR AND HEARING AID IN LEFT EAR  Vision Screening Comments: [place] EXAMS  Dietary issues and exercise activities discussed: Current Exercise Habits: Home exercise routine, Type of exercise: walking, Time (Minutes): 30, Frequency (Times/Week): 3, Weekly Exercise (Minutes/Week): 90  Goals    . DIET - EAT MORE FRUITS AND VEGETABLES    . Patient Stated     None at this time      Depression Screen PHQ 2/9 Scores 06/10/2020 04/24/2019 04/22/2018 07/04/2017 03/29/2017  PHQ - 2 Score 1 0 0 0 1    Fall Risk Fall Risk  06/10/2020 04/24/2019 03/05/2019 07/04/2017 03/29/2017  Falls in the past year? 0 0 0 No No  Number falls in past yr: 0 - - - -  Injury with Fall? 0 0 - - -  Risk for fall due to : Impaired balance/gait;Impaired vision;Impaired mobility - - - -  Follow up Falls prevention discussed  Falls evaluation completed;Education provided;Falls prevention discussed - - -    FALL RISK PREVENTION PERTAINING TO THE HOME:  Any stairs in or around the home? Yes  If so, are there any without handrails? No  Home free of loose throw rugs in walkways, pet beds, electrical cords, etc? Yes  Adequate lighting in your home to reduce risk of falls? Yes   ASSISTIVE DEVICES UTILIZED TO PREVENT FALLS:  Life alert? No  Use of a cane, walker or w/c? Yes  Grab bars in the bathroom? No  Shower chair or bench in shower? No  Elevated toilet seat or a handicapped toilet? Yes   TIMED UP AND GO:  Was the test performed? Yes .  Length of time to ambulate 10 feet: 15 sec.   Gait steady and fast without use of assistive device  Cognitive Function:     6CIT Screen 06/10/2020 04/24/2019  What Year? 0 points 0 points  What month? 0 points 0 points  What time? - 0 points  Count back from 20 0 points 0 points  Months in reverse 4 points 0 points  Repeat phrase 4 points 0 points  Total Score - 0    Immunizations Immunization History  Administered Date(s) Administered  . Fluad Quad(high Dose 65+) 03/02/2019, 02/15/2020  . Influenza Split 02/23/2009, 02/02/2010, 03/05/2011, 02/18/2012, 02/02/2017  . Influenza Whole 03/23/2009  . Influenza, High Dose Seasonal PF 02/02/2017, 02/17/2018  . Influenza-Unspecified 05/04/2003  . PFIZER SARS-COV-2 Vaccination 07/26/2019,  08/25/2019  . Pneumococcal Conjugate-13 04/12/2014  . Pneumococcal Polysaccharide-23 02/23/2009, 03/23/2009  . Tdap 12/03/2016  . Zoster 05/29/2013    TDAP status: Up to date  Flu Vaccine status: Up to date  Done 02/15/20 Pneumococcal vaccine status: Up to date  Covid-19 vaccine status: Completed vaccines Booster recommended   Qualifies for Shingles Vaccine? Yes   Zostavax completed Yes   Shingrix Completed?: No.    Education has been provided regarding the importance of this vaccine. Patient has been advised to call  insurance company to determine out of pocket expense if they have not yet received this vaccine. Advised may also receive vaccine at local pharmacy or Health Dept. Verbalized acceptance and understanding.  Screening Tests Health Maintenance  Topic Date Due  . COLONOSCOPY (Pts 45-25yr Insurance coverage will need to be confirmed)  12/11/2019  . COVID-19 Vaccine (3 - Booster for Pfizer series) 02/25/2020  . OPHTHALMOLOGY EXAM  06/25/2020  . HEMOGLOBIN A1C  08/22/2020  . FOOT EXAM  02/22/2021  . TETANUS/TDAP  12/04/2026  . INFLUENZA VACCINE  Completed  . PNA vac Low Risk Adult  Completed    Health Maintenance  Health Maintenance Due  Topic Date Due  . COLONOSCOPY (Pts 45-49yrInsurance coverage will need to be confirmed)  12/11/2019  . COVID-19 Vaccine (3 - Booster for Pfizer series) 02/25/2020    Colorectal cancer screening: No longer required.    Additional Screening:  Vision Screening: Recommended annual ophthalmology exams for early detection of glaucoma and other disorders of the eye. Is the patient up to date with their annual eye exam?  Yes  Who is the provider or what is the name of the office in which the patient attends annual eye exams? [facility]   Dental Screening: Recommended annual dental exams for proper oral hygiene  Community Resource Referral / Chronic Care Management: CRR required this visit?  No   CCM required this visit?  No      Plan:     I have personally reviewed and noted the following in the patient's chart:   . Medical and social history . Use of alcohol, tobacco or illicit drugs  . Current medications and supplements . Functional ability and status . Nutritional status . Physical activity . Advanced directives . List of other physicians . Hospitalizations, surgeries, and ER visits in previous 12 months . Vitals . Screenings to include cognitive, depression, and falls . Referrals and appointments  In addition, I have  reviewed and discussed with patient certain preventive protocols, quality metrics, and best practice recommendations. A written personalized care plan for preventive services as well as general preventive health recommendations were provided to patient.     TiWillette BraceLPN   1/6/0/6301 Nurse Notes: Looking in last note for colonscopy pt has aged out so I did not place order. Please advise if any different . Pt stated he had the Covid booster in November and will call back with the date.

## 2020-06-17 ENCOUNTER — Other Ambulatory Visit: Payer: Self-pay

## 2020-06-20 ENCOUNTER — Ambulatory Visit: Payer: Medicare HMO | Admitting: Endocrinology

## 2020-06-21 ENCOUNTER — Other Ambulatory Visit: Payer: Self-pay

## 2020-06-21 ENCOUNTER — Ambulatory Visit: Payer: Medicare HMO | Admitting: Endocrinology

## 2020-06-21 ENCOUNTER — Encounter: Payer: Self-pay | Admitting: Endocrinology

## 2020-06-21 VITALS — BP 138/64 | HR 75 | Ht 70.0 in | Wt 168.6 lb

## 2020-06-21 DIAGNOSIS — R600 Localized edema: Secondary | ICD-10-CM | POA: Diagnosis not present

## 2020-06-21 DIAGNOSIS — Z794 Long term (current) use of insulin: Secondary | ICD-10-CM | POA: Diagnosis not present

## 2020-06-21 DIAGNOSIS — N184 Chronic kidney disease, stage 4 (severe): Secondary | ICD-10-CM

## 2020-06-21 DIAGNOSIS — E1165 Type 2 diabetes mellitus with hyperglycemia: Secondary | ICD-10-CM | POA: Diagnosis not present

## 2020-06-21 LAB — BASIC METABOLIC PANEL
BUN: 47 mg/dL — ABNORMAL HIGH (ref 6–23)
CO2: 24 mEq/L (ref 19–32)
Calcium: 9.1 mg/dL (ref 8.4–10.5)
Chloride: 103 mEq/L (ref 96–112)
Creatinine, Ser: 2.12 mg/dL — ABNORMAL HIGH (ref 0.40–1.50)
GFR: 28.57 mL/min — ABNORMAL LOW (ref >60.00)
Glucose, Bld: 150 mg/dL — ABNORMAL HIGH (ref 70–99)
Potassium: 4.3 mEq/L (ref 3.5–5.1)
Sodium: 135 mEq/L (ref 135–145)

## 2020-06-21 LAB — POCT GLYCOSYLATED HEMOGLOBIN (HGB A1C): Hemoglobin A1C: 5.2 % (ref 4.0–5.6)

## 2020-06-21 LAB — GLUCOSE, POCT (MANUAL RESULT ENTRY): POC Glucose: 149 mg/dl — AB (ref 70–99)

## 2020-06-21 NOTE — Patient Instructions (Addendum)
Check blood sugars on waking 3-4 up days a week  Also check blood sugars about 2 hours after meals and do this after SUPPER OR OTHER meals by rotation  Recommended blood sugar levels on waking up are 90-130 and about 2 hours after meal is 130-180  Please bring your blood sugar monitor to each visit, thank you  REDUCE am shot to 8 units

## 2020-06-21 NOTE — Progress Notes (Signed)
Patient ID: Justin Salinas, male   DOB: 1940-11-18, 80 y.o.   MRN: [id number]           Reason for Appointment: Follow-up for Type 2 Diabetes    History of Present Illness:          Date of diagnosis of type 2 diabetes mellitus:2000        Background history:   His diabetes had been mild initially and treated with metformin He had subsequently other medications added including Amaryl and Januvia No details are available from PCP office, [name] in 2015 was 7.6  Because of his poor control was started on insulin on 01/03/17. He has been on Actos since 06/2017+ He had a baseline A1c A1c 9.7 on his initial consultation in 01/2017  Recent history:   Insulin regimen: NovoLog mix, 10 units at breakfast and 8 at supper Prandin 1 mg at breakfast and lunch daily  A1c is lower than expected at 5.2 Last fructosamine was 340, usually over 320  Current management, blood sugar patterns and problems identified:   He is trying to take Prandin at breakfast and lunch as directed, previously taking it at breakfast and dinner.  With this his blood sugars before dinnertime are frequently higher than before but also he is cutting back on what he is eating at lunch, today only had some beans  He forgets to check his sugars after breakfast and lunch as also dinner and only doing them before breakfast and suppertime with his insulin shots, this is despite repeated reminders  His weight appears to be higher but only 3 pounds since his last visit in 9/21  Usually FASTING blood sugars are excellent  Occasionally however blood sugars may be in the 80s both breakfast and supper times  He is trying to do a little more activity with his exercise bike, otherwise he tries to walk when able to  Remembers to take his insulin before eating consistently  No hypoglycemic symptoms reported Checking blood sugars regularly twice a day  Side effects from medications have been: None  Compliance with the  medical regimen: Fair  Glucose monitoring:  done 2 times a day         Glucometer:  Accu-Chek guide me .      Blood Glucose readings by download of monitor:    PRE-MEAL Fasting Lunch Dinner Bedtime Overall  Glucose range:  86-115   83-340    Mean/median:  102   138   119   POST-MEAL PC Breakfast PC Lunch PC Dinner  Glucose range:     Mean/median:      Previously:  PRE-MEAL Fasting Lunch Dinner Bedtime Overall  Glucose range:  88-148   120-277    Mean/median:  115   206   159   POST-MEAL PC Breakfast PC Lunch PC Dinner  Glucose range:  ? ?  Mean/median:        Self-care: The diet that the patient has been following is: tries to limit sweets, fried food       Typical meal intake: Breakfast is eggs,  Oatmeal/ meat, toast.  Lunch: Sandwich or.Crackers with or without peanut butter usually                Dietician visit, most recent: 03/2017   Weight history:   Wt Readings from Last 3 Encounters:  06/21/20 168 lb 9.6 oz (76.5 kg)  06/10/20 165 lb 12.8 oz (75.2 kg)  03/11/20 160 lb (72.6 kg)  Glycemic control:   Lab Results  Component Value Date   HGBA1C 5.2 06/21/2020   HGBA1C 5.7 (A) 02/23/2020   HGBA1C 6.0 (A) 11/23/2019   Lab Results  Component Value Date   MICROALBUR <0.7 04/27/2019   LDLCALC 65 02/23/2020   CREATININE 1.69 (H) 02/23/2020   Lab Results  Component Value Date   MICRALBCREAT 0.6 04/27/2019   Microalbumin ratio 9 in 12/2015  Lab Results  Component Value Date   FRUCTOSAMINE 340 (H) 02/23/2020   FRUCTOSAMINE 352 (H) 09/21/2019   FRUCTOSAMINE 321 (H) 04/27/2019      Allergies as of 06/21/2020      Reactions   Exenatide Other (See Comments)   Linagliptin Other (See Comments)   Lasix [furosemide] Other (See Comments)   Constipation      Medication List       Accurate as of June 21, 2020  2:07 PM. If you have any questions, ask your nurse or doctor.        Accu-Chek Softclix Lancet Dev Kit   alfuzosin 10 MG 24 hr  tablet Commonly known as: UROXATRAL Take 10 mg by mouth daily with breakfast.   amLODipine 10 MG tablet Commonly known as: NORVASC Take 1 tablet (10 mg total) by mouth daily.   aspirin 81 MG tablet Take 81 mg by mouth daily.   CENTRUM SILVER ADULT 50+ PO Take by mouth.   Droplet Pen Needles 31G X 5 MM Misc Generic drug: Insulin Pen Needle USE AS DIRECTED TWICE DAILY   furosemide 40 MG tablet Commonly known as: LASIX Take 40 mg by mouth.   gabapentin 300 MG capsule Commonly known as: NEURONTIN Take 1 capsule (300 mg total) by mouth 3 (three) times daily as needed (nerve pain).   glucose blood test strip Commonly known as: Accu-Chek Guide Use to check blood sugars 2 times daily   Accu-Chek Guide test strip Generic drug: glucose blood TEST  BLOOD  SUGARS TWICE DAILY   hydrALAZINE 50 MG tablet Commonly known as: APRESOLINE Take 1 tablet (50 mg total) by mouth 3 (three) times daily.   ipratropium 0.06 % nasal spray Commonly known as: ATROVENT Place 2 sprays into both nostrils 4 (four) times daily.   losartan 100 MG tablet Commonly known as: COZAAR Take 1 tablet (100 mg total) by mouth daily.   NovoLOG Mix 70/30 FlexPen (70-30) 100 UNIT/ML FlexPen Generic drug: insulin aspart protamine - aspart INJECT 10 UNITS UNDER THE SKIN BEFORE BREAKFAST AND 8 UNITS AT DINNER   repaglinide 1 MG tablet Commonly known as: PRANDIN TAKE 1 TABLET (1 MG TOTAL) BY MOUTH 2 (TWO) TIMES DAILY BEFORE A MEAL.   simvastatin 40 MG tablet Commonly known as: ZOCOR Take 1 tablet (40 mg total) by mouth daily at 6 PM.   TRUEplus Lancets 28G Misc TEST BLOOD SUGAR TWICE DAILY   vitamin C 1000 MG tablet Take 1,000 mg by mouth daily.       Allergies:  Allergies  Allergen Reactions  . Exenatide Other (See Comments)  . Linagliptin Other (See Comments)  . Lasix [Furosemide] Other (See Comments)    Constipation    Past Medical History:  Diagnosis Date  . Allergy   . Anemia   .  Asthma   . Atherosclerosis of abdominal aorta (HCC) 02/26/2020   Seen on x-ray L-spine September 2021  . BPH (benign prostatic hyperplasia)   . CAD (coronary artery disease)    a. s/p MI 09/1993;  b. known CTO of RCA;  c.  06/2014 Cath: LM short/mod dzs, LAD 95ost, LCX nl, RI nl, RCA 100, EF 55%;  c. 06/2014 s/p CABG x 4: LIMA->LAD, VG->D1, VG->OM1, VG->Acute Marginal.  . Carotid stenosis    a. Carotid US 1/61:  RICA 09-60%; LICA 45-40% >> FU 1 year  . CKD (chronic kidney disease), stage III ([place])   . Constipation    in the past 2-3 weeks  . Diabetes ([place])    AODM  . Diastolic dysfunction    a. 05/2015 Echo: EF 60-65%, LVH, Gr 1 DD, mild MR, mod dil RA/LA, mod TR, PASP 33mmHg.  . Essential hypertension   . GERD (gastroesophageal reflux disease)   . Hyperlipidemia     Past Surgical History:  Procedure Laterality Date  . ANGIOPLASTY  09/22/1993   POBA of RCA (Dr. Marella Chimes)  . BACK SURGERY  2000   back fusion - Dr. Earnie Larsson  . CARDIAC CATHETERIZATION  11/28/1998   "silent occlusion" of RCA w/collaterals from left coronary system, prox LAD w/40-50% eccentric narrowing, 1st diagonal with 70-80% eccentric narrowing, 85% narrowing of prox small OM1 (Dr. Marella Chimes)  . CAROTID DOPPLER  07/2011   left subclavian (50-69%); right bulb (0-49%); RICA (normal); left mid-distal CCA (0-49%); left bulb/prox ICA (50-69%); left vertebral with abnormal antegrade flow  . CORONARY ARTERY BYPASS GRAFT N/A 05/21/2014   Procedure: CORONARY ARTERY BYPASS GRAFTING (CABG) x  four, using left internal mammary artery and right leg greater saphenous vein harvested endoscopically;  Surgeon: Melrose Nakayama, MD;  Location: [place];  Service: Open Heart Surgery;  Laterality: N/A;  . INTRAOPERATIVE TRANSESOPHAGEAL ECHOCARDIOGRAM N/A 05/21/2014   Procedure: INTRAOPERATIVE TRANSESOPHAGEAL ECHOCARDIOGRAM;  Surgeon: Melrose Nakayama, MD;  Location: [place];  Service: Open Heart Surgery;  Laterality: N/A;  . LEFT  HEART CATHETERIZATION WITH CORONARY ANGIOGRAM N/A 05/19/2014   Procedure: LEFT HEART CATHETERIZATION WITH CORONARY ANGIOGRAM;  Surgeon: Blane Ohara, MD;  Location: [facility] CATH LAB;  Service: Cardiovascular;  Laterality: N/A;  . NASAL SINUS SURGERY  2010  . NM MYOCAR PERF WALL MOTION  09/19/2009   bruce myoview - mild perfusion defect in basal inferior region (infarct/scar), EF 60%, low risk scan  . RENAL DOPPLER  10/2011   SMA w/ 70-99% diameter reduction & high grade stenosis; R&L renals w/narrowing and increased velocities (60-99%), R kidney smaller than L  . TRANSTHORACIC ECHOCARDIOGRAM  10/20/2012   EF 55-60%, moderate concentric hypertrophy, ventricular septum thickness increased, calcified MV annulus  . VASECTOMY  1963    Family History  Problem Relation Age of Onset  . Uterine cancer Mother   . Stroke Father   . Diabetes Sister   . Colon polyps Brother   . Heart disease Brother   . Colon cancer Neg Hx   . Esophageal cancer Neg Hx   . Rectal cancer Neg Hx   . Stomach cancer Neg Hx     Social History:  reports that he quit smoking about 48 years ago. His smoking use included cigarettes. His smokeless tobacco use includes snuff. He reports current alcohol use of about 3.0 standard drinks of alcohol per week. He reports that he does not use drugs.   Review of Systems   Lipid history: Taking simvastatin 40 mg, most recent LDL:  This is prescribed by cardiologist    Lab Results  Component Value Date   CHOL 139 02/23/2020   HDL 60.60 02/23/2020   LDLCALC 65 02/23/2020   LDLDIRECT 45.0 04/17/2017   TRIG 65.0 02/23/2020  CHOLHDL 2 02/23/2020           Hypertension: Blood pressure is treated with hydralazine, losartan 50 and he is also on Lasix and amlodipine Followed by nephrologist   BP Readings from Last 3 Encounters:  06/21/20 138/64  06/10/20 122/64  03/11/20 (!) 180/80   CKD: Renal function has been as follows  Ultrasound showed reduced right kidney  size  Takes Lasix for edema, followed by nephrologist every 3 months  Lab Results  Component Value Date   CREATININE 1.69 (H) 02/23/2020   CREATININE 2.65 (H) 09/21/2019   CREATININE 2.06 (H) 07/20/2019     Most recent eye exam was 1/21  Most recent foot exam: 9/21   LABS:  Office Visit on 06/21/2020  Component Date Value Ref Range Status  . Hemoglobin A1C 06/21/2020 5.2  4.0 - 5.6 % Final  . POC Glucose 06/21/2020 149* 70 - 99 mg/dl Final    Physical Examination:  BP 138/64   Pulse 75   Ht $R'5\' 10"'lX$  (1.778 m)   Wt 168 lb 9.6 oz (76.5 kg)   SpO2 95%   BMI 24.19 kg/m     ASSESSMENT:  Diabetes type 2, insulin requiring  See history of present illness for detailed discussion of current diabetes management, blood sugar patterns and problems identified  [name] is probably falsely low because of anemia and renal insufficiency and now only 5.2 Usually fructosamine is higher, last level was increased at 340  He is on twice a day premixed insulin along with repaglinide at breakfast and lunch  He is continuing to improve his diet and cutting back on portions especially at lunchtime However blood sugars are still occasionally over 200 likely based on his carbohydrate intake in the afternoons He thinks he is taking his Prandin at lunchtime regularly now and blood sugars fairly good today in the afternoon Fasting readings are more stable and averaging only 100 [name] at any time but has occasional readings in the eighties  Hypercholesterolemia: Well-controlled as of the last visit  HYPERTENSION : The blood pressure is relatively better today: Currently managed by other physicians  EDEMA, this is mild and mostly on his legs, secondary to CKD   PLAN:   Again reminded him to check readings after meals more than before breakfast and dinner and written instructions given To call if he is having consistently high readings after any given meal Recheck fructosamine  and renal function today  He will continue exercise bike regularly Reduce morning insulin to 8 units also to avoid potential for low sugars He will try to be consistent with diet and have some carbohydrate at lunch also Make sure he takes Prandin before starting to eat  To follow-up with nephrologist regarding blood pressure and diuretic management   Patient Instructions  Check blood sugars on waking 3-4 up days a week  Also check blood sugars about 2 hours after meals and do this after SUPPER OR OTHER meals by rotation  Recommended blood sugar levels on waking up are 90-130 and about 2 hours after meal is 130-180  Please bring your blood sugar monitor to each visit, thank you  REDUCE am shot to 8 units    Elayne Snare 06/21/2020, 2:07 PM   Note: This office note was prepared with Dragon voice recognition system technology. Any transcriptional errors that result from this process are unintentional.

## 2020-06-22 LAB — FRUCTOSAMINE: Fructosamine: 340 umol/L — ABNORMAL HIGH (ref 0–285)

## 2020-06-22 NOTE — Progress Notes (Signed)
Kidney test forwarded to nephrology office.  Appears somewhat worse and he will discuss with them

## 2020-06-23 ENCOUNTER — Telehealth: Payer: Self-pay

## 2020-06-23 NOTE — Telephone Encounter (Signed)
Patient is calling in wanting to know where he can get a handicap sticker.

## 2020-06-23 NOTE — Telephone Encounter (Signed)
Form done, patient will pick up tomorrow

## 2020-06-23 NOTE — Progress Notes (Signed)
Cardiology Office Note:    Date:  06/24/2020   ID:  Justin Salinas, DOB 11/28/1940, MRN [id number]  PCP:  Parker, Caleb M, MD  [facility] Cardiologist:  Michael Cooper, MD   [facility] Electrophysiologist:  None   Referring MD: Parker, Caleb M, MD   Chief Complaint:  Follow-up (CAD)    Patient Profile:    Justin Salinas is a 80 y.o. male with:   Coronary artery disease   S/p PCI in 1990s  S/p CABG in 12/15  Hypertension   Hyperlipidemia   Diabetes mellitus   Chronic kidney disease   Carotid artery dz  Prior CV studies: Carotid US 12/02/2017 R 1-39; L 40-59; L VA aberrant flow  Carotid US 5/18 R 1-39; L 40-59, elevated velocities R subclavian >> FU 1 year  Carotid US 1/17 RICA 40-59%; LICA 60-79% >> FU 1 year  Echo 12/15 Moderate LVH, EF 60-65%, normal wall motion, grade 1 diastolic dysfunction, aortic sclerosis without stenosis, MAC, mild MR, moderate BAE, moderate TR, PASP 40 mmHg  LHC 12/15 LM: 50% proximal  LAD: Ostial/proximal 95%, with heavy calcification. D1 75% proximal  LCx: AV circumflex has mild irregularity and supplies 2 OM branches.  RCA: Severe calcification. Total occlusion at the ostium. left-to-right collaterals.  LVEF of 55% Sent for CABG  Renal Art US 5/14 SMA 70-99% R and L RA 60-99%  Carotid US 5/14 L Subclavian 50-69% R ICA 0-49%; L ICA 50-69% >> repeat 1 year  Myoview 4/11 Inferior scar, no ischemia, EF 60%   History of Present Illness:    Justin Salinas was last seen by Dr. Cooper in 1/21.   He is here alone.  He is overall doing well without chest pain, shortness of breath, syncope, orthopnea.  He does have some leg edema and his nephrologist has placed him on Furosemide.  He enjoys walking the trails around his home.  [place] near [place].  He has a Chihuahua mix and enjoys spending time with him.  Past Medical History:  Diagnosis Date  . Allergy   . Anemia   . Asthma   . Atherosclerosis  of abdominal aorta (HCC) 02/26/2020   Seen on x-ray L-spine September 2021  . BPH (benign prostatic hyperplasia)   . CAD (coronary artery disease)    a. s/p MI 09/1993;  b. known CTO of RCA;  c. 06/2014 Cath: LM short/mod dzs, LAD 95ost, LCX nl, RI nl, RCA 100, EF 55%;  c. 06/2014 s/p CABG x 4: LIMA->LAD, VG->D1, VG->OM1, VG->Acute Marginal.  . Carotid stenosis    a. Carotid US 1/17:  RICA 40-59%; LICA 60-79% >> FU 1 year  . CKD (chronic kidney disease), stage III (HCC)   . Constipation    in the past 2-3 weeks  . Diabetes (HCC)    AODM  . Diastolic dysfunction    a. 05/2015 Echo: EF 60-65%, LVH, Gr 1 DD, mild MR, mod dil RA/LA, mod TR, PASP 40mmHg.  . Essential hypertension   . GERD (gastroesophageal reflux disease)   . Hyperlipidemia     Current Medications: Current Meds  Medication Sig  . ACCU-CHEK GUIDE test strip TEST  BLOOD  SUGARS TWICE DAILY  . alfuzosin (UROXATRAL) 10 MG 24 hr tablet Take 10 mg by mouth daily with breakfast.  . amLODipine (NORVASC) 10 MG tablet Take 1 tablet (10 mg total) by mouth daily.  . Ascorbic Acid (VITAMIN C) 1000 MG tablet Take 1,000 mg by mouth daily.  . aspirin   81 MG tablet Take 81 mg by mouth daily.  . DROPLET PEN NEEDLES 31G X 5 MM MISC USE AS DIRECTED TWICE DAILY  . furosemide (LASIX) 40 MG tablet Take 40 mg by mouth.  . gabapentin (NEURONTIN) 300 MG capsule Take 1 capsule (300 mg total) by mouth 3 (three) times daily as needed (nerve pain).  Marland Kitchen glucose blood (ACCU-CHEK GUIDE) test strip Use to check blood sugars 2 times daily  . insulin aspart protamine - aspart (NOVOLOG MIX 70/30 FLEXPEN) (70-30) 100 UNIT/ML FlexPen INJECT 10 UNITS UNDER THE SKIN BEFORE BREAKFAST AND 8 UNITS AT DINNER (Patient taking differently: INJECT 8 UNITS UNDER THE SKIN BEFORE BREAKFAST AND 8 UNITS AT DINNER)  . ipratropium (ATROVENT) 0.06 % nasal spray Place 2 sprays into both nostrils 4 (four) times daily.  . Lancets Misc. (ACCU-CHEK SOFTCLIX LANCET DEV) KIT   . losartan  (COZAAR) 100 MG tablet Take 1 tablet (100 mg total) by mouth daily.  . Multiple Vitamins-Minerals (CENTRUM SILVER ADULT 50+ PO) Take by mouth.  . repaglinide (PRANDIN) 1 MG tablet TAKE 1 TABLET (1 MG TOTAL) BY MOUTH 2 (TWO) TIMES DAILY BEFORE A MEAL.  . simvastatin (ZOCOR) 40 MG tablet Take 1 tablet (40 mg total) by mouth daily at 6 PM.  . TRUEplus Lancets 28G MISC TEST BLOOD SUGAR TWICE DAILY     Allergies:   Exenatide, Linagliptin, and Lasix [furosemide]   Social History   Tobacco Use  . Smoking status: Former Smoker    Types: Cigarettes    Quit date: 06/04/1972    Years since quitting: 48.0  . Smokeless tobacco: Current User    Types: Snuff  . Tobacco comment: quit about 40 years  Vaping Use  . Vaping Use: Never used  Substance Use Topics  . Alcohol use: Yes    Alcohol/week: 3.0 standard drinks    Types: 3 Standard drinks or equivalent per week    Comment: a glass of red wine 3 or 4 days a week.  . Drug use: No     Family Hx: The patient's family history includes Colon polyps in his brother; Diabetes in his sister; Heart disease in his brother; Stroke in his father; Uterine cancer in his mother. There is no history of Colon cancer, Esophageal cancer, Rectal cancer, or Stomach cancer.  ROS   EKGs/Labs/Other Test Reviewed:    EKG:  EKG is   ordered today.  The ekg ordered today demonstrates normal sinus rhythm, HR 71, normal axis, ant-septal Q waves, PAC, 1st degree AVB, QTc 439, no change from prior ECG  Recent Labs: 02/23/2020: ALT 16 06/21/2020: BUN 47; Creatinine, Ser 2.12; Potassium 4.3; Sodium 135   Recent Lipid Panel Lab Results  Component Value Date/Time   CHOL 139 02/23/2020 09:00 AM   TRIG 65.0 02/23/2020 09:00 AM   HDL 60.60 02/23/2020 09:00 AM   CHOLHDL 2 02/23/2020 09:00 AM   LDLCALC 65 02/23/2020 09:00 AM   LDLDIRECT 45.0 04/17/2017 09:14 AM      Risk Assessment/Calculations:      Physical Exam:    VS:  BP (!) 142/50   Pulse 71   Ht 5' 10"  (1.778 m)   Wt 166 lb 3.2 oz (75.4 kg)   SpO2 98%   BMI 23.85 kg/m     Wt Readings from Last 3 Encounters:  06/24/20 166 lb 3.2 oz (75.4 kg)  06/21/20 168 lb 9.6 oz (76.5 kg)  06/10/20 165 lb 12.8 oz (75.2 kg)     Constitutional:  Appearance: Healthy appearance. Not in distress.  Neck:     Vascular: No JVR. JVD normal.  Pulmonary:     Effort: Pulmonary effort is normal.     Breath sounds: No wheezing. No rales.  Cardiovascular:     Normal rate. Regular rhythm. Normal S1. Normal S2.     Murmurs: There is a grade 1/6 systolic murmur at the URSB.  Edema:    Pretibial: bilateral 1+ edema of the pretibial area. Abdominal:     Palpations: Abdomen is soft.  Skin:    General: Skin is warm and dry.  Neurological:     General: No focal deficit present.     Mental Status: Alert and oriented to person, place and time.     Cranial Nerves: Cranial nerves are intact.       ASSESSMENT & PLAN:    1. Coronary artery disease involving native coronary artery of native heart without angina pectoris S/p CABG in 12/15.  He is overall doing well without anginal symptoms.  Continue aspirin, simvastatin.  2. Essential hypertension Blood pressure is above target.  He notes that usually runs better than this.  He did have a salty meal this morning.  Of asked him to monitor his blood pressure at home and let us know if his BPs are typically >130/80.  If he remains above target, we could increase his hydralazine to 75 mg 3 times a day.  3. Mixed hyperlipidemia LDL optimal on most recent lab work.  Continue current Rx.    4. Bilateral carotid artery stenosis Carotid US in 7/19 with moderate disease on the left.  Arrange follow-up carotid US.  Continue aspirin, statin.  5. Stage 3a chronic kidney disease (HCC) Recent creatinine 2.12.  He is followed by Dr. Moshe Cipro with nephrology.  Dispo:  Return in about 1 year (around 06/24/2021) for Routine Follow Up, w/ Dr. Burt Knack, or Richardson Dopp,  PA-C, in person.   Medication Adjustments/Labs and Tests Ordered: Current medicines are reviewed at length with the patient today.  Concerns regarding medicines are outlined above.  Tests Ordered: Orders Placed This Encounter  Procedures  . EKG 12-Lead  . VAS US CAROTID   Medication Changes: No orders of the defined types were placed in this encounter.   Signed, Richardson Dopp, PA-C  06/24/2020 10:48 AM    [facility] [address], [place], [place]  [address] Phone: [phone]; Fax: [phone]

## 2020-06-24 ENCOUNTER — Other Ambulatory Visit: Payer: Self-pay

## 2020-06-24 ENCOUNTER — Encounter: Payer: Self-pay | Admitting: Physician Assistant

## 2020-06-24 ENCOUNTER — Ambulatory Visit: Payer: Medicare HMO | Admitting: Physician Assistant

## 2020-06-24 VITALS — BP 142/50 | HR 71 | Ht 70.0 in | Wt 166.2 lb

## 2020-06-24 DIAGNOSIS — N1831 Chronic kidney disease, stage 3a: Secondary | ICD-10-CM | POA: Diagnosis not present

## 2020-06-24 DIAGNOSIS — I10 Essential (primary) hypertension: Secondary | ICD-10-CM | POA: Diagnosis not present

## 2020-06-24 DIAGNOSIS — E782 Mixed hyperlipidemia: Secondary | ICD-10-CM

## 2020-06-24 DIAGNOSIS — I2510 Atherosclerotic heart disease of native coronary artery without angina pectoris: Secondary | ICD-10-CM

## 2020-06-24 DIAGNOSIS — I6523 Occlusion and stenosis of bilateral carotid arteries: Secondary | ICD-10-CM | POA: Diagnosis not present

## 2020-06-24 NOTE — Patient Instructions (Signed)
Medication Instructions:  Your physician recommends that you continue on your current medications as directed. Please refer to the Current Medication list given to you today.  *If you need a refill on your cardiac medications before your next appointment, please call your pharmacy*  Lab Work: None ordered today  Testing/Procedures: Your physician has requested that you have a carotid duplex. This test is an ultrasound of the carotid arteries in your neck. It looks at blood flow through these arteries that supply the brain with blood. Allow one hour for this exam. There are no restrictions or special instructions.  Follow-Up: At [facility], you and your health needs are our priority.  As part of our continuing mission to provide you with exceptional heart care, we have created designated Provider Care Teams.  These Care Teams include your primary Cardiologist (physician) and Advanced Practice Providers (APPs -  Physician Assistants and Nurse Practitioners) who all work together to provide you with the care you need, when you need it.  Your next appointment:   12 month(s)  The format for your next appointment:   In Person  Provider:   You may see [name], MD or Richardson Dopp, PA-C  Other Instructions Monitor your blood pressure and call the office if the blood pressure is 130/80 or higher consistently.

## 2020-06-28 ENCOUNTER — Other Ambulatory Visit: Payer: Self-pay | Admitting: Physician Assistant

## 2020-06-28 ENCOUNTER — Ambulatory Visit (HOSPITAL_COMMUNITY)
Admission: RE | Admit: 2020-06-28 | Discharge: 2020-06-28 | Disposition: A | Discharge status: Home / Self Care | Payer: Medicare HMO | Source: Ambulatory Visit | Attending: Cardiovascular Disease | Admitting: Cardiovascular Disease

## 2020-06-28 ENCOUNTER — Other Ambulatory Visit: Payer: Self-pay

## 2020-06-28 DIAGNOSIS — I6523 Occlusion and stenosis of bilateral carotid arteries: Secondary | ICD-10-CM

## 2020-06-29 ENCOUNTER — Telehealth: Payer: Self-pay | Admitting: Cardiovascular Disease

## 2020-06-29 ENCOUNTER — Encounter: Payer: Self-pay | Admitting: Physician Assistant

## 2020-06-29 NOTE — Telephone Encounter (Signed)
    I went in pt's chart to see who called him today 

## 2020-07-01 LAB — HM DIABETES EYE EXAM

## 2020-07-18 DIAGNOSIS — N183 Chronic kidney disease, stage 3 unspecified: Secondary | ICD-10-CM | POA: Diagnosis not present

## 2020-07-18 DIAGNOSIS — H2513 Age-related nuclear cataract, bilateral: Secondary | ICD-10-CM | POA: Diagnosis not present

## 2020-07-18 DIAGNOSIS — I129 Hypertensive chronic kidney disease with stage 1 through stage 4 chronic kidney disease, or unspecified chronic kidney disease: Secondary | ICD-10-CM | POA: Diagnosis not present

## 2020-07-18 DIAGNOSIS — E1122 Type 2 diabetes mellitus with diabetic chronic kidney disease: Secondary | ICD-10-CM | POA: Diagnosis not present

## 2020-07-18 DIAGNOSIS — Z794 Long term (current) use of insulin: Secondary | ICD-10-CM | POA: Diagnosis not present

## 2020-07-18 DIAGNOSIS — N400 Enlarged prostate without lower urinary tract symptoms: Secondary | ICD-10-CM | POA: Diagnosis not present

## 2020-07-18 DIAGNOSIS — D509 Iron deficiency anemia, unspecified: Secondary | ICD-10-CM | POA: Diagnosis not present

## 2020-07-18 DIAGNOSIS — Z7984 Long term (current) use of oral hypoglycemic drugs: Secondary | ICD-10-CM | POA: Diagnosis not present

## 2020-07-18 DIAGNOSIS — E113291 Type 2 diabetes mellitus with mild nonproliferative diabetic retinopathy without macular edema, right eye: Secondary | ICD-10-CM | POA: Diagnosis not present

## 2020-07-18 DIAGNOSIS — H524 Presbyopia: Secondary | ICD-10-CM | POA: Diagnosis not present

## 2020-07-18 DIAGNOSIS — D631 Anemia in chronic kidney disease: Secondary | ICD-10-CM | POA: Diagnosis not present

## 2020-07-18 DIAGNOSIS — H5203 Hypermetropia, bilateral: Secondary | ICD-10-CM | POA: Diagnosis not present

## 2020-07-18 DIAGNOSIS — H52203 Unspecified astigmatism, bilateral: Secondary | ICD-10-CM | POA: Diagnosis not present

## 2020-07-22 DIAGNOSIS — L923 Foreign body granuloma of the skin and subcutaneous tissue: Secondary | ICD-10-CM | POA: Diagnosis not present

## 2020-07-22 DIAGNOSIS — L538 Other specified erythematous conditions: Secondary | ICD-10-CM | POA: Diagnosis not present

## 2020-07-22 DIAGNOSIS — Z9621 Cochlear implant status: Secondary | ICD-10-CM | POA: Diagnosis not present

## 2020-08-09 ENCOUNTER — Other Ambulatory Visit: Payer: Self-pay | Admitting: Cardiovascular Disease

## 2020-08-12 ENCOUNTER — Other Ambulatory Visit: Payer: Self-pay | Admitting: Family Medicine

## 2020-08-12 NOTE — Telephone Encounter (Signed)
Rx refill request approved per Dr. Corey's orders. 

## 2020-08-18 DIAGNOSIS — M25552 Pain in left hip: Secondary | ICD-10-CM | POA: Diagnosis not present

## 2020-08-18 DIAGNOSIS — M48062 Spinal stenosis, lumbar region with neurogenic claudication: Secondary | ICD-10-CM | POA: Diagnosis not present

## 2020-08-24 ENCOUNTER — Other Ambulatory Visit: Payer: Self-pay | Admitting: Physical Medicine and Rehabilitation

## 2020-08-24 DIAGNOSIS — M25552 Pain in left hip: Secondary | ICD-10-CM

## 2020-09-07 ENCOUNTER — Ambulatory Visit
Admission: RE | Admit: 2020-09-07 | Discharge: 2020-09-07 | Disposition: A | Discharge status: Home / Self Care | Payer: Medicare HMO | Source: Ambulatory Visit | Attending: Physical Medicine and Rehabilitation | Admitting: Physical Medicine and Rehabilitation

## 2020-09-07 DIAGNOSIS — M25552 Pain in left hip: Secondary | ICD-10-CM | POA: Diagnosis not present

## 2020-09-08 ENCOUNTER — Telehealth: Payer: Self-pay | Admitting: *Deleted

## 2020-09-08 DIAGNOSIS — M48062 Spinal stenosis, lumbar region with neurogenic claudication: Secondary | ICD-10-CM | POA: Diagnosis not present

## 2020-09-08 NOTE — Telephone Encounter (Signed)
   [facility] Pre-operative Risk Assessment    Patient Name: Justin Salinas  DOB: [birth year]  MRN: [id number]   HEARTCARE STAFF: - Please ensure there is not already an duplicate clearance open for this procedure. - Under Visit Info/Reason for Call, type in Other and utilize the format Clearance MM/DD/YY or Clearance TBD. Do not use dashes or single digits. - If request is for dental extraction, please clarify the # of teeth to be extracted.  Request for surgical clearance:  1. What type of surgery is being performed? L3-4 LUMBAR LAMINECTOMY   2. When is this surgery scheduled? TBD   3. What type of clearance is required (medical clearance vs. Pharmacy clearance to hold med vs. Both)? MEDICAL  4. Are there any medications that need to be held prior to surgery and how long? ASA   5. Practice name and name of physician performing surgery? [facility]; DR. Mallie Mussel POOL   6. What is the office phone number? [phone]   7.   What is the office fax number? [name]: [name]  8.   Anesthesia type (None, local, MAC, general) ? GENERAL   Julaine Hua 09/08/2020, 4:50 PM  _________________________________________________________________   (provider comments below)

## 2020-09-08 NOTE — Telephone Encounter (Signed)
   Patient Name: Justin Salinas  DOB: 02-13-1941  MRN: [id number]   Primary Cardiologist: [name], MD  Chart reviewed as part of pre-operative protocol coverage. Patient was contacted 09/08/2020 in reference to pre-operative risk assessment for pending surgery as outlined below.  Justin Salinas was last seen on 06/24/2020 by Richardson Dopp, PA-C.  Since that day, Justin Salinas has done well without any chest pain or shortness of breath.  He has been able to exercise on the treadmill for 10 to 15 minutes every other day without any exertional symptoms.  Therefore, based on ACC/AHA guidelines, the patient would be at acceptable risk for the planned procedure without further cardiovascular testing.   The patient was advised that if he develops new symptoms prior to surgery to contact our office to arrange for a follow-up visit, and he verbalized understanding.  I will route this recommendation to the requesting party via Epic fax function and remove from pre-op pool. Please call with questions.  Dr. Burt Knack, would you be okay with Justin Salinas to hold his aspirin for 7 days prior to the surgery and restart as soon as possible after the surgery at the surgeon's discretion.  Please forward your response to P CV [place], PA 09/08/2020, 5:09 PM

## 2020-09-09 NOTE — Telephone Encounter (Signed)
Yes- this is fine; thanks- 

## 2020-09-15 ENCOUNTER — Other Ambulatory Visit: Payer: Self-pay | Admitting: Neurosurgery

## 2020-09-20 ENCOUNTER — Other Ambulatory Visit (HOSPITAL_COMMUNITY)
Admission: RE | Admit: 2020-09-20 | Discharge: 2020-09-20 | Disposition: A | Discharge status: Home / Self Care | Payer: Medicare HMO | Source: Ambulatory Visit | Attending: Neurosurgery | Admitting: Neurosurgery

## 2020-09-20 DIAGNOSIS — Z01812 Encounter for preprocedural laboratory examination: Secondary | ICD-10-CM | POA: Insufficient documentation

## 2020-09-20 DIAGNOSIS — Z20822 Contact with and (suspected) exposure to covid-19: Secondary | ICD-10-CM | POA: Diagnosis not present

## 2020-09-20 LAB — SARS CORONAVIRUS 2 (TAT 6-24 HRS): SARS Coronavirus 2: NEGATIVE

## 2020-09-21 ENCOUNTER — Other Ambulatory Visit (HOSPITAL_COMMUNITY): Payer: Medicare HMO

## 2020-09-22 ENCOUNTER — Encounter (HOSPITAL_COMMUNITY): Payer: Self-pay | Admitting: Neurosurgery

## 2020-09-22 NOTE — Anesthesia Preprocedure Evaluation (Addendum)
Anesthesia Evaluation  Patient identified by MRN, date of birth, ID band Patient awake    Reviewed: Allergy & Precautions, H&P , NPO status , Patient's Chart, lab work & pertinent test results  Airway Mallampati: II  TM Distance: >3 FB Neck ROM: Full    Dental no notable dental hx. (+) Teeth Intact, Dental Advisory Given   Pulmonary asthma , former smoker,    Pulmonary exam normal breath sounds clear to auscultation       Cardiovascular Exercise Tolerance: Good hypertension, Pt. on medications + CAD and + CABG   Rhythm:Regular Rate:Normal     Neuro/Psych negative neurological ROS  negative psych ROS   GI/Hepatic Neg liver ROS, GERD  ,  Endo/Other  diabetes, Insulin Dependent  Renal/GU Renal InsufficiencyRenal disease  negative genitourinary   Musculoskeletal  (+) Arthritis , Osteoarthritis,    Abdominal   Peds  Hematology  (+) Blood dyscrasia, anemia ,   Anesthesia Other Findings   Reproductive/Obstetrics negative OB ROS                            Anesthesia Physical Anesthesia Plan  ASA: III  Anesthesia Plan: General   Post-op Pain Management:    Induction: Intravenous  PONV Risk Score and Plan: 3 and Ondansetron and Dexamethasone  Airway Management Planned: Oral ETT  Additional Equipment:   Intra-op Plan:   Post-operative Plan: Extubation in OR  Informed Consent: I have reviewed the patients History and Physical, chart, labs and discussed the procedure including the risks, benefits and alternatives for the proposed anesthesia with the patient or authorized representative who has indicated his/her understanding and acceptance.     Dental advisory given  Plan Discussed with: CRNA  Anesthesia Plan Comments:        Anesthesia Quick Evaluation

## 2020-09-22 NOTE — Progress Notes (Signed)
Spoke with pt for pre-op call. Hx of CABG in 2015. Cardiac clearance in Epic on 09/08/20 from Dr. Burt Knack. Pt denies any recent chest pain or shortness of breath. Pt is a type 2 Diabetic. Pt's last A1C was 5.2 on 06/21/20. Instructed pt not to take his Prandin in the AM. Instructed pt to check his blood sugar when he gets up in the AM. If blood sugar is 70 or below, treat with 1/2 cup of clear juice (apple or cranberry) and recheck blood sugar 15 minutes after drinking juice.   Covid test done 09/20/20 and it's negative.   Pt states he's been in quarantine since the test was done and understands that he stays in quarantine until he comes to the hospital tomorrow.

## 2020-09-23 ENCOUNTER — Other Ambulatory Visit: Payer: Self-pay

## 2020-09-23 ENCOUNTER — Encounter (HOSPITAL_COMMUNITY)
Admission: RE | Disposition: A | Discharge status: Home / Self Care | Payer: Self-pay | Source: Home / Self Care | Attending: Neurosurgery

## 2020-09-23 ENCOUNTER — Observation Stay (HOSPITAL_COMMUNITY)
Admission: RE | Admit: 2020-09-23 | Discharge: 2020-09-23 | Disposition: A | Discharge status: Home / Self Care | Payer: Medicare HMO | Attending: Neurosurgery | Admitting: Neurosurgery

## 2020-09-23 ENCOUNTER — Ambulatory Visit (HOSPITAL_COMMUNITY): Payer: Medicare HMO | Admitting: Certified Registered"

## 2020-09-23 ENCOUNTER — Ambulatory Visit (HOSPITAL_COMMUNITY): Payer: Medicare HMO

## 2020-09-23 ENCOUNTER — Encounter (HOSPITAL_COMMUNITY): Payer: Self-pay | Admitting: Neurosurgery

## 2020-09-23 DIAGNOSIS — I5030 Unspecified diastolic (congestive) heart failure: Secondary | ICD-10-CM | POA: Diagnosis not present

## 2020-09-23 DIAGNOSIS — Z981 Arthrodesis status: Secondary | ICD-10-CM | POA: Diagnosis not present

## 2020-09-23 DIAGNOSIS — M48061 Spinal stenosis, lumbar region without neurogenic claudication: Secondary | ICD-10-CM | POA: Diagnosis not present

## 2020-09-23 DIAGNOSIS — I2510 Atherosclerotic heart disease of native coronary artery without angina pectoris: Secondary | ICD-10-CM | POA: Insufficient documentation

## 2020-09-23 DIAGNOSIS — I130 Hypertensive heart and chronic kidney disease with heart failure and stage 1 through stage 4 chronic kidney disease, or unspecified chronic kidney disease: Secondary | ICD-10-CM | POA: Diagnosis not present

## 2020-09-23 DIAGNOSIS — D509 Iron deficiency anemia, unspecified: Secondary | ICD-10-CM | POA: Diagnosis not present

## 2020-09-23 DIAGNOSIS — Z87891 Personal history of nicotine dependence: Secondary | ICD-10-CM | POA: Diagnosis not present

## 2020-09-23 DIAGNOSIS — Z794 Long term (current) use of insulin: Secondary | ICD-10-CM | POA: Diagnosis not present

## 2020-09-23 DIAGNOSIS — N183 Chronic kidney disease, stage 3 unspecified: Secondary | ICD-10-CM | POA: Diagnosis not present

## 2020-09-23 DIAGNOSIS — M4726 Other spondylosis with radiculopathy, lumbar region: Secondary | ICD-10-CM | POA: Diagnosis not present

## 2020-09-23 DIAGNOSIS — M5416 Radiculopathy, lumbar region: Secondary | ICD-10-CM | POA: Diagnosis not present

## 2020-09-23 DIAGNOSIS — Z79899 Other long term (current) drug therapy: Secondary | ICD-10-CM | POA: Diagnosis not present

## 2020-09-23 DIAGNOSIS — M4716 Other spondylosis with myelopathy, lumbar region: Secondary | ICD-10-CM | POA: Diagnosis not present

## 2020-09-23 DIAGNOSIS — K219 Gastro-esophageal reflux disease without esophagitis: Secondary | ICD-10-CM | POA: Insufficient documentation

## 2020-09-23 DIAGNOSIS — E785 Hyperlipidemia, unspecified: Secondary | ICD-10-CM | POA: Insufficient documentation

## 2020-09-23 DIAGNOSIS — Z419 Encounter for procedure for purposes other than remedying health state, unspecified: Secondary | ICD-10-CM

## 2020-09-23 DIAGNOSIS — E119 Type 2 diabetes mellitus without complications: Secondary | ICD-10-CM | POA: Diagnosis not present

## 2020-09-23 HISTORY — DX: Personal history of urinary calculi: Z87.442

## 2020-09-23 HISTORY — PX: LUMBAR LAMINECTOMY/DECOMPRESSION MICRODISCECTOMY: SHX5026

## 2020-09-23 LAB — BASIC METABOLIC PANEL
Anion gap: 7 (ref 5–15)
BUN: 76 mg/dL — ABNORMAL HIGH (ref 8–23)
CO2: 26 mmol/L (ref 22–32)
Calcium: 9.1 mg/dL (ref 8.9–10.3)
Chloride: 106 mmol/L (ref 98–111)
Creatinine, Ser: 2.54 mg/dL — ABNORMAL HIGH (ref 0.61–1.24)
GFR, Estimated: 25 mL/min — ABNORMAL LOW (ref >60)
Glucose, Bld: 35 mg/dL — CL (ref 70–99)
Potassium: 4 mmol/L (ref 3.5–5.1)
Sodium: 139 mmol/L (ref 135–145)

## 2020-09-23 LAB — SURGICAL PCR SCREEN
MRSA, PCR: NEGATIVE
Staphylococcus aureus: POSITIVE — AB

## 2020-09-23 LAB — GLUCOSE, CAPILLARY
Glucose-Capillary: 157 mg/dL — ABNORMAL HIGH (ref 70–99)
Glucose-Capillary: 51 mg/dL — ABNORMAL LOW (ref 70–99)
Glucose-Capillary: 125 mg/dL — ABNORMAL HIGH (ref 70–99)
Glucose-Capillary: 174 mg/dL — ABNORMAL HIGH (ref 70–99)

## 2020-09-23 LAB — CBC WITH DIFFERENTIAL/PLATELET
Abs Immature Granulocytes: 0.02 10*3/uL (ref 0.00–0.07)
Basophils Absolute: 0.1 10*3/uL (ref 0.0–0.1)
Basophils Relative: 1 %
Eosinophils Absolute: 0.7 10*3/uL — ABNORMAL HIGH (ref 0.0–0.5)
Eosinophils Relative: 11 %
HCT: 32.6 % — ABNORMAL LOW (ref 39.0–52.0)
Hemoglobin: 10.5 g/dL — ABNORMAL LOW (ref 13.0–17.0)
Immature Granulocytes: 0 %
Lymphocytes Relative: 33 %
Lymphs Abs: 2.1 10*3/uL (ref 0.7–4.0)
MCH: 32.9 pg (ref 26.0–34.0)
MCHC: 32.2 g/dL (ref 30.0–36.0)
MCV: 102.2 fL — ABNORMAL HIGH (ref 80.0–100.0)
Monocytes Absolute: 0.6 10*3/uL (ref 0.1–1.0)
Monocytes Relative: 9 %
Neutro Abs: 3 10*3/uL (ref 1.7–7.7)
Neutrophils Relative %: 46 %
Platelets: 130 10*3/uL — ABNORMAL LOW (ref 150–400)
RBC: 3.19 MIL/uL — ABNORMAL LOW (ref 4.22–5.81)
RDW: 12.1 % (ref 11.5–15.5)
WBC: 6.4 10*3/uL (ref 4.0–10.5)
nRBC: 0 % (ref 0.0–0.2)

## 2020-09-23 LAB — HEMOGLOBIN A1C
Hgb A1c MFr Bld: 5.9 % — ABNORMAL HIGH (ref 4.8–5.6)
Mean Plasma Glucose: 122.63 mg/dL

## 2020-09-23 SURGERY — LUMBAR LAMINECTOMY/DECOMPRESSION MICRODISCECTOMY 1 LEVEL
Anesthesia: General | Site: Spine Lumbar | Laterality: Left

## 2020-09-23 MED ORDER — PROPOFOL 10 MG/ML IV BOLUS
INTRAVENOUS | Status: AC
  Filled 2020-09-23: qty 40

## 2020-09-23 MED ORDER — INSULIN ASPART PROT & ASPART (70-30 MIX) 100 UNIT/ML ~~LOC~~ SUSP
8.0000 [IU] | Freq: Two times a day (BID) | SUBCUTANEOUS | Status: DC
  Filled 2020-09-23: qty 10

## 2020-09-23 MED ORDER — KETOROLAC TROMETHAMINE 15 MG/ML IJ SOLN
INTRAMUSCULAR | Status: DC | PRN
  Administered 2020-09-23: 15 mg via INTRAVENOUS

## 2020-09-23 MED ORDER — LIDOCAINE 2% (20 MG/ML) 5 ML SYRINGE
INTRAMUSCULAR | Status: DC | PRN
  Administered 2020-09-23: 60 mg via INTRAVENOUS

## 2020-09-23 MED ORDER — SODIUM CHLORIDE 0.9% FLUSH: 3.0000 mL | INTRAVENOUS | Status: DC | PRN

## 2020-09-23 MED ORDER — HYDROMORPHONE HCL 1 MG/ML IJ SOLN: 0.2500 mg | INTRAMUSCULAR | Status: DC | PRN

## 2020-09-23 MED ORDER — ACETAMINOPHEN 325 MG PO TABS: 650.0000 mg | ORAL_TABLET | ORAL | Status: DC | PRN

## 2020-09-23 MED ORDER — CEFAZOLIN SODIUM-DEXTROSE 2-4 GM/100ML-% IV SOLN
2.0000 g | INTRAVENOUS | Status: AC
  Administered 2020-09-23: 2 g via INTRAVENOUS
  Filled 2020-09-23: qty 100

## 2020-09-23 MED ORDER — FENTANYL CITRATE (PF) 250 MCG/5ML IJ SOLN
INTRAMUSCULAR | Status: DC | PRN
  Administered 2020-09-23: 50 ug via INTRAVENOUS
  Administered 2020-09-23: 100 ug via INTRAVENOUS

## 2020-09-23 MED ORDER — ALFUZOSIN HCL ER 10 MG PO TB24
10.0000 mg | ORAL_TABLET | Freq: Every day | ORAL | Status: DC
  Filled 2020-09-23: qty 1

## 2020-09-23 MED ORDER — DEXTROSE 50 % IV SOLN
INTRAVENOUS | Status: AC
  Administered 2020-09-23: 25 g via INTRAVENOUS
  Filled 2020-09-23: qty 50

## 2020-09-23 MED ORDER — PHENYLEPHRINE HCL-NACL 10-0.9 MG/250ML-% IV SOLN
INTRAVENOUS | Status: DC | PRN
  Administered 2020-09-23: 30 ug/min via INTRAVENOUS

## 2020-09-23 MED ORDER — 0.9 % SODIUM CHLORIDE (POUR BTL) OPTIME
TOPICAL | Status: DC | PRN
  Administered 2020-09-23: 1000 mL

## 2020-09-23 MED ORDER — CHLORHEXIDINE GLUCONATE 0.12 % MT SOLN
OROMUCOSAL | Status: AC
  Administered 2020-09-23: 15 mL
  Filled 2020-09-23: qty 15

## 2020-09-23 MED ORDER — HYDROMORPHONE HCL 1 MG/ML IJ SOLN: 1.0000 mg | INTRAMUSCULAR | Status: DC | PRN

## 2020-09-23 MED ORDER — CYCLOBENZAPRINE HCL 10 MG PO TABS: 10.0000 mg | ORAL_TABLET | Freq: Three times a day (TID) | ORAL | 0 refills | Status: DC | PRN

## 2020-09-23 MED ORDER — THROMBIN 5000 UNITS EX SOLR
CUTANEOUS | Status: DC | PRN
  Administered 2020-09-23: 5000 [IU] via TOPICAL

## 2020-09-23 MED ORDER — LOSARTAN POTASSIUM 50 MG PO TABS
50.0000 mg | ORAL_TABLET | Freq: Every day | ORAL | Status: DC
  Administered 2020-09-23: 50 mg via ORAL
  Filled 2020-09-23: qty 1

## 2020-09-23 MED ORDER — ROCURONIUM BROMIDE 10 MG/ML (PF) SYRINGE
PREFILLED_SYRINGE | INTRAVENOUS | Status: DC | PRN
  Administered 2020-09-23: 50 mg via INTRAVENOUS

## 2020-09-23 MED ORDER — THROMBIN 5000 UNITS EX SOLR
CUTANEOUS | Status: AC
  Filled 2020-09-23: qty 5000

## 2020-09-23 MED ORDER — ACETAMINOPHEN 650 MG RE SUPP: 650.0000 mg | RECTAL | Status: DC | PRN

## 2020-09-23 MED ORDER — INSULIN ASPART PROT & ASPART (70-30 MIX) 100 UNIT/ML PEN: 8.0000 [IU] | PEN_INJECTOR | Freq: Two times a day (BID) | SUBCUTANEOUS | Status: DC

## 2020-09-23 MED ORDER — FENTANYL CITRATE (PF) 250 MCG/5ML IJ SOLN
INTRAMUSCULAR | Status: AC
  Filled 2020-09-23: qty 5

## 2020-09-23 MED ORDER — PROPOFOL 10 MG/ML IV BOLUS
INTRAVENOUS | Status: DC | PRN
  Administered 2020-09-23: 100 mg via INTRAVENOUS

## 2020-09-23 MED ORDER — CYCLOBENZAPRINE HCL 10 MG PO TABS
10.0000 mg | ORAL_TABLET | Freq: Three times a day (TID) | ORAL | Status: DC | PRN
  Filled 2020-09-23: qty 1

## 2020-09-23 MED ORDER — MENTHOL 3 MG MT LOZG: 1.0000 | LOZENGE | OROMUCOSAL | Status: DC | PRN

## 2020-09-23 MED ORDER — EPHEDRINE SULFATE-NACL 50-0.9 MG/10ML-% IV SOSY
PREFILLED_SYRINGE | INTRAVENOUS | Status: DC | PRN
  Administered 2020-09-23: 10 mg via INTRAVENOUS

## 2020-09-23 MED ORDER — ACETAMINOPHEN 10 MG/ML IV SOLN
INTRAVENOUS | Status: DC | PRN
  Administered 2020-09-23: 1000 mg via INTRAVENOUS

## 2020-09-23 MED ORDER — FUROSEMIDE 40 MG PO TABS: 40.0000 mg | ORAL_TABLET | Freq: Two times a day (BID) | ORAL | Status: DC

## 2020-09-23 MED ORDER — OMEGA-3-ACID ETHYL ESTERS 1 G PO CAPS
1.0000 g | ORAL_CAPSULE | Freq: Every day | ORAL | Status: DC
  Administered 2020-09-23: 1 g via ORAL
  Filled 2020-09-23: qty 1

## 2020-09-23 MED ORDER — AMLODIPINE BESYLATE 5 MG PO TABS
10.0000 mg | ORAL_TABLET | Freq: Every day | ORAL | Status: DC
  Administered 2020-09-23: 10 mg via ORAL
  Filled 2020-09-23: qty 2

## 2020-09-23 MED ORDER — SODIUM CHLORIDE 0.9 % IV SOLN: 250.0000 mL | INTRAVENOUS | Status: DC

## 2020-09-23 MED ORDER — ASCORBIC ACID 500 MG PO TABS
1000.0000 mg | ORAL_TABLET | Freq: Every day | ORAL | Status: DC
  Filled 2020-09-23: qty 2

## 2020-09-23 MED ORDER — ONDANSETRON HCL 4 MG/2ML IJ SOLN
INTRAMUSCULAR | Status: DC | PRN
  Administered 2020-09-23: 4 mg via INTRAVENOUS

## 2020-09-23 MED ORDER — SIMVASTATIN 20 MG PO TABS: 40.0000 mg | ORAL_TABLET | Freq: Every day | ORAL | Status: DC

## 2020-09-23 MED ORDER — BUPIVACAINE HCL (PF) 0.25 % IJ SOLN
INTRAMUSCULAR | Status: DC | PRN
  Administered 2020-09-23: 20 mL

## 2020-09-23 MED ORDER — ONDANSETRON HCL 4 MG/2ML IJ SOLN: 4.0000 mg | Freq: Four times a day (QID) | INTRAMUSCULAR | Status: DC | PRN

## 2020-09-23 MED ORDER — SUGAMMADEX SODIUM 200 MG/2ML IV SOLN
INTRAVENOUS | Status: DC | PRN
  Administered 2020-09-23: 200 mg via INTRAVENOUS

## 2020-09-23 MED ORDER — INSULIN ASPART 100 UNIT/ML ~~LOC~~ SOLN
0.0000 [IU] | Freq: Three times a day (TID) | SUBCUTANEOUS | Status: DC
  Administered 2020-09-23: 3 [IU] via SUBCUTANEOUS

## 2020-09-23 MED ORDER — DEXTROSE 50 % IV SOLN: 25.0000 g | Freq: Once | INTRAVENOUS | Status: AC

## 2020-09-23 MED ORDER — BUPIVACAINE HCL (PF) 0.25 % IJ SOLN
INTRAMUSCULAR | Status: AC
  Filled 2020-09-23: qty 30

## 2020-09-23 MED ORDER — REPAGLINIDE 1 MG PO TABS
1.0000 mg | ORAL_TABLET | Freq: Two times a day (BID) | ORAL | Status: DC
  Filled 2020-09-23 (×2): qty 1

## 2020-09-23 MED ORDER — SODIUM CHLORIDE 0.9 % IV SOLN: 1.0000 g | Freq: Three times a day (TID) | INTRAVENOUS | Status: DC

## 2020-09-23 MED ORDER — CHLORHEXIDINE GLUCONATE CLOTH 2 % EX PADS: 6.0000 | MEDICATED_PAD | Freq: Once | CUTANEOUS | Status: DC

## 2020-09-23 MED ORDER — HYDROCODONE-ACETAMINOPHEN 10-325 MG PO TABS: 1.0000 | ORAL_TABLET | ORAL | Status: DC | PRN

## 2020-09-23 MED ORDER — HYDROCODONE-ACETAMINOPHEN 5-325 MG PO TABS
1.0000 | ORAL_TABLET | ORAL | Status: DC | PRN
  Administered 2020-09-23: 1 via ORAL
  Filled 2020-09-23: qty 1

## 2020-09-23 MED ORDER — KETOROLAC TROMETHAMINE 15 MG/ML IJ SOLN
15.0000 mg | Freq: Four times a day (QID) | INTRAMUSCULAR | Status: DC
  Administered 2020-09-23: 15 mg via INTRAVENOUS
  Filled 2020-09-23: qty 1

## 2020-09-23 MED ORDER — LACTATED RINGERS IV SOLN: INTRAVENOUS | Status: DC | PRN

## 2020-09-23 MED ORDER — OMEGA-3 1400 MG PO CAPS: 1400.0000 mg | ORAL_CAPSULE | Freq: Every day | ORAL | Status: DC

## 2020-09-23 MED ORDER — DEXAMETHASONE SODIUM PHOSPHATE 10 MG/ML IJ SOLN
10.0000 mg | Freq: Once | INTRAMUSCULAR | Status: AC
  Administered 2020-09-23: 10 mg via INTRAVENOUS
  Filled 2020-09-23: qty 1

## 2020-09-23 MED ORDER — PHENOL 1.4 % MT LIQD: 1.0000 | OROMUCOSAL | Status: DC | PRN

## 2020-09-23 MED ORDER — ASPIRIN EC 81 MG PO TBEC
81.0000 mg | DELAYED_RELEASE_TABLET | Freq: Every day | ORAL | Status: DC
  Administered 2020-09-23: 81 mg via ORAL
  Filled 2020-09-23: qty 1

## 2020-09-23 MED ORDER — GABAPENTIN 300 MG PO CAPS
300.0000 mg | ORAL_CAPSULE | Freq: Three times a day (TID) | ORAL | Status: DC
  Administered 2020-09-23: 300 mg via ORAL
  Filled 2020-09-23: qty 1

## 2020-09-23 MED ORDER — ONDANSETRON HCL 4 MG PO TABS: 4.0000 mg | ORAL_TABLET | Freq: Four times a day (QID) | ORAL | Status: DC | PRN

## 2020-09-23 MED ORDER — HYDRALAZINE HCL 10 MG PO TABS
50.0000 mg | ORAL_TABLET | Freq: Three times a day (TID) | ORAL | Status: DC
  Administered 2020-09-23: 50 mg via ORAL
  Filled 2020-09-23: qty 5

## 2020-09-23 MED ORDER — MUPIROCIN 2 % EX OINT
1.0000 "application " | TOPICAL_OINTMENT | Freq: Two times a day (BID) | CUTANEOUS | Status: DC
  Administered 2020-09-23: 1 via NASAL
  Filled 2020-09-23: qty 22

## 2020-09-23 MED ORDER — SODIUM CHLORIDE 0.9% FLUSH: 3.0000 mL | Freq: Two times a day (BID) | INTRAVENOUS | Status: DC

## 2020-09-23 MED ORDER — HYDROCODONE-ACETAMINOPHEN 5-325 MG PO TABS: 1.0000 | ORAL_TABLET | ORAL | 0 refills | Status: DC | PRN

## 2020-09-23 SURGICAL SUPPLY — 49 items
BAG DECANTER FOR FLEXI CONT (MISCELLANEOUS) ×2 IMPLANT
BAND RUBBER #18 3X1/16 STRL (MISCELLANEOUS) ×4 IMPLANT
BENZOIN TINCTURE PRP APPL 2/3 (GAUZE/BANDAGES/DRESSINGS) ×2 IMPLANT
BLADE CLIPPER SURG (BLADE) IMPLANT
BUR CUTTER 7.0 ROUND (BURR) ×2 IMPLANT
CANISTER SUCT 3000ML PPV (MISCELLANEOUS) ×2 IMPLANT
CARTRIDGE OIL MAESTRO DRILL (MISCELLANEOUS) ×1 IMPLANT
CLSR STERI-STRIP ANTIMIC 1/2X4 (GAUZE/BANDAGES/DRESSINGS) ×2 IMPLANT
COVER WAND RF STERILE (DRAPES) ×2 IMPLANT
DECANTER SPIKE VIAL GLASS SM (MISCELLANEOUS) IMPLANT
DERMABOND ADVANCED (GAUZE/BANDAGES/DRESSINGS) ×1
DERMABOND ADVANCED .7 DNX12 (GAUZE/BANDAGES/DRESSINGS) ×1 IMPLANT
DIFFUSER DRILL AIR PNEUMATIC (MISCELLANEOUS) ×2 IMPLANT
DRAPE HALF SHEET 40X57 (DRAPES) IMPLANT
DRAPE LAPAROTOMY 100X72X124 (DRAPES) ×2 IMPLANT
DRAPE MICROSCOPE LEICA (MISCELLANEOUS) ×2 IMPLANT
DRAPE SURG 17X23 STRL (DRAPES) ×4 IMPLANT
DRSG OPSITE POSTOP 3X4 (GAUZE/BANDAGES/DRESSINGS) ×2 IMPLANT
DURAPREP 26ML APPLICATOR (WOUND CARE) ×2 IMPLANT
ELECT REM PT RETURN 9FT ADLT (ELECTROSURGICAL) ×2
ELECTRODE REM PT RTRN 9FT ADLT (ELECTROSURGICAL) ×1 IMPLANT
GAUZE 4X4 16PLY RFD (DISPOSABLE) IMPLANT
GAUZE SPONGE 4X4 12PLY STRL (GAUZE/BANDAGES/DRESSINGS) ×2 IMPLANT
GLOVE BIO SURGEON STRL SZ 6.5 (GLOVE) ×4 IMPLANT
GLOVE ECLIPSE 9.0 STRL (GLOVE) ×2 IMPLANT
GLOVE EXAM NITRILE XL STR (GLOVE) IMPLANT
GLOVE SURG POLYISO LF SZ7 (GLOVE) ×2 IMPLANT
GLOVE SURG UNDER POLY LF SZ6.5 (GLOVE) ×4 IMPLANT
GLOVE SURG UNDER POLY LF SZ7.5 (GLOVE) ×2 IMPLANT
GOWN STRL REUS W/ TWL LRG LVL3 (GOWN DISPOSABLE) ×1 IMPLANT
GOWN STRL REUS W/ TWL XL LVL3 (GOWN DISPOSABLE) ×3 IMPLANT
GOWN STRL REUS W/TWL 2XL LVL3 (GOWN DISPOSABLE) IMPLANT
GOWN STRL REUS W/TWL LRG LVL3 (GOWN DISPOSABLE) ×2
GOWN STRL REUS W/TWL XL LVL3 (GOWN DISPOSABLE) ×6
KIT BASIN OR (CUSTOM PROCEDURE TRAY) ×2 IMPLANT
KIT TURNOVER KIT B (KITS) ×2 IMPLANT
NEEDLE HYPO 22GX1.5 SAFETY (NEEDLE) ×2 IMPLANT
NEEDLE SPNL 22GX3.5 QUINCKE BK (NEEDLE) IMPLANT
NS IRRIG 1000ML POUR BTL (IV SOLUTION) ×2 IMPLANT
OIL CARTRIDGE MAESTRO DRILL (MISCELLANEOUS) ×2
PACK LAMINECTOMY NEURO (CUSTOM PROCEDURE TRAY) ×2 IMPLANT
PAD ARMBOARD 7.5X6 YLW CONV (MISCELLANEOUS) ×6 IMPLANT
SPONGE SURGIFOAM ABS GEL SZ50 (HEMOSTASIS) ×2 IMPLANT
STRIP CLOSURE SKIN 1/2X4 (GAUZE/BANDAGES/DRESSINGS) ×2 IMPLANT
SUT VIC AB 2-0 CT1 18 (SUTURE) ×2 IMPLANT
SUT VIC AB 3-0 SH 8-18 (SUTURE) ×2 IMPLANT
TOWEL GREEN STERILE (TOWEL DISPOSABLE) ×2 IMPLANT
TOWEL GREEN STERILE FF (TOWEL DISPOSABLE) ×2 IMPLANT
WATER STERILE IRR 1000ML POUR (IV SOLUTION) ×2 IMPLANT

## 2020-09-23 NOTE — Evaluation (Signed)
Occupational Therapy Evaluation and Discharge Patient Details Name: Justin Salinas MRN: [id number] DOB: Feb 19, 1941 Today's Date: 09/23/2020    History of Present Illness PT is an 80 y/o male s/p L3-4 decompressive laminotomy and foraminotomies. PMH includes asthma, CAD, CKD, DM, and HTN.   Clinical Impression   This 80 yo male admitted and underwent above presents to acute OT with all education completed, we will D/C from acute OT.    Follow Up Recommendations  No OT follow up;Supervision/Assistance - 24 hour    Equipment Recommendations  3 in 1 bedside commode       Precautions / Restrictions Precautions Precautions: Back Precaution Booklet Issued: Yes (comment) Precaution Comments: REviewed back precautions with pt. Restrictions Weight Bearing Restrictions: No      Mobility Bed Mobility Overal bed mobility: Needs Assistance Bed Mobility: Rolling;Sidelying to Sit;Sit to Sidelying Rolling: Supervision Sidelying to sit: Supervision     Sit to sidelying: Supervision General bed mobility comments: S for VCs for techniques    Transfers Overall transfer level: Needs assistance Equipment used: Rolling walker (2 wheeled) Transfers: Sit to/from Stand Sit to Stand: Min guard            Balance Overall balance assessment: Needs assistance Sitting-balance support: No upper extremity supported;Feet supported Sitting balance-Leahy Scale: Fair     Standing balance support: Single extremity supported;During functional activity Standing balance-Leahy Scale: Poor Standing balance comment: Reliant on BUE support                           ADL either performed or assessed with clinical judgement   ADL Overall ADL's : Needs assistance/impaired Eating/Feeding: Independent;Sitting   Grooming: Standing;Min guard Grooming Details (indicate cue type and reason): Educated pt on use of two cups for brushing teeth to avoid bending over sink Upper Body Bathing: Set  up;Sitting   Lower Body Bathing: Min guard;Sit to/from stand Lower Body Bathing Details (indicate cue type and reason): Pt can cross his legs to get to his feet while seated Upper Body Dressing : Set up;Sitting   Lower Body Dressing: Min guard;Sit to/from stand Lower Body Dressing Details (indicate cue type and reason): Pt can cross his legs to get to his feet while seated Toilet Transfer: Min guard;Ambulation;RW;BSC (over toilet)   Toileting- Clothing Manipulation and Hygiene: Min guard;Sit to/from stand Toileting - Clothing Manipulation Details (indicate cue type and reason): Educated pt on use of wet wipes for back peri area to avoid twisting as much             Vision Baseline Vision/History: Wears glasses Wears Glasses: At all times              Pertinent Vitals/Pain Pain Assessment: 0-10 Pain Score: 3  Faces Pain Scale: Hurts a little bit Pain Location: back Pain Descriptors / Indicators: Sore Pain Intervention(s): Limited activity within patient's tolerance;Monitored during session;Repositioned     Hand Dominance Right   Extremity/Trunk Assessment Upper Extremity Assessment Upper Extremity Assessment: Overall WFL for tasks assessed     Communication Communication Communication: HOH   Cognition Arousal/Alertness: Awake/alert Behavior During Therapy: WFL for tasks assessed/performed Overall Cognitive Status: Within Functional Limits for tasks assessed                                                Home Living Family/patient  expects to be discharged to:: Private residence Living Arrangements: Alone Available Help at Discharge: Family;Available 24 hours/day Type of Home: House Home Access: Stairs to enter [employer] of Steps: 2 Entrance Stairs-Rails: None Home Layout: Two level Alternate Level Stairs-Number of Steps: flight Alternate Level Stairs-Rails: Right           Home Equipment: Cane - single point;Walker - 2  wheels   Additional Comments: Pt told OT he did not have RW.      Prior Functioning/Environment Level of Independence: Independent                 OT Problem List: Decreased range of motion;Impaired balance (sitting and/or standing);Pain         OT Goals(Current goals can be found in the care plan section) Acute Rehab OT Goals Patient Stated Goal: to go home  OT Frequency:                AM-PAC OT "6 Clicks" Daily Activity     Outcome Measure Help from another person eating meals?: None Help from another person taking care of personal grooming?: A Little Help from another person toileting, which includes using toliet, bedpan, or urinal?: A Little Help from another person bathing (including washing, rinsing, drying)?: A Little Help from another person to put on and taking off regular upper body clothing?: A Little Help from another person to put on and taking off regular lower body clothing?: A Little 6 Click Score: 19   End of Session Equipment Utilized During Treatment: Gait belt;Rolling walker Nurse Communication:  (Pt needs a 3n1)  Activity Tolerance: Patient tolerated treatment well Patient left: in bed;with call bell/phone within reach  OT Visit Diagnosis: Unsteadiness on feet (R26.81);Pain Pain - part of body:  (incisional)                Time: 6294-7654 OT Time Calculation (min): 34 min Charges:  OT General Charges $OT Visit: 1 Visit OT Evaluation $OT Eval Moderate Complexity: 1 Mod OT Treatments $Self Care/Home Management : 8-22 mins  Golden Circle, OTR/L Acute [employer] Pager [phone] Office [phone]     Almon Register 09/23/2020, 4:59 PM

## 2020-09-23 NOTE — Discharge Instructions (Signed)

## 2020-09-23 NOTE — Brief Op Note (Signed)
09/23/2020  9:30 AM  PATIENT:  Justin Salinas  80 y.o. male  PRE-OPERATIVE DIAGNOSIS:  Stenosis  POST-OPERATIVE DIAGNOSIS:  Stenosis  PROCEDURE:  Procedure(s): Laminectomy and Foraminotomy - left - Lumbar three-Lumbar four (Left)  SURGEON:  Surgeon(s) and Role:    Earnie Larsson, MD - Primary  PHYSICIAN ASSISTANT:   ASSISTANTSMearl Latin   ANESTHESIA:   general  EBL:  50cc   BLOOD ADMINISTERED:none  DRAINS: none   LOCAL MEDICATIONS USED:  MARCAINE     SPECIMEN:  No Specimen  DISPOSITION OF SPECIMEN:  N/A  COUNTS:  YES  TOURNIQUET:  * No tourniquets in log *  DICTATION: .Dragon Dictation  PLAN OF CARE: Admit for overnight observation  PATIENT DISPOSITION:  PACU - hemodynamically stable.   Delay start of Pharmacological VTE agent (>24hrs) due to surgical blood loss or risk of bleeding: yes

## 2020-09-23 NOTE — Progress Notes (Signed)
Patient is discharged from room 3C11 at this time. Alert and in stable condition. IV site d/c'd and instructions read to patient and daughter with understanding verbalized and all questions answered. Left unit via wheelchair with all belongings at side.  

## 2020-09-23 NOTE — Discharge Summary (Signed)
Physician Discharge Summary  Patient ID: Justin Salinas MRN: [id number] DOB/AGE: [birth year] 80 y.o.  Admit date: 09/23/2020 Discharge date: 09/23/2020  Admission Diagnoses:  Discharge Diagnoses:  Active Problems:   Lumbar radiculopathy   Discharged Condition: good  Hospital Course: Patient admitted to the hospital where he underwent uncomplicated left-sided B1-5 decompressive surgery.  Postoperatively doing well.  Preoperative back and radicular pain improved.  Standing ambulating and voiding without difficulty.  Ready for discharge home.Consults:   Significant Diagnostic Studies:   Treatments:   Discharge Exam: Blood pressure (!) 172/60, pulse 66, temperature 97.6 F (36.4 C), temperature source Oral, resp. rate 17, height 5' 10" (1.778 m), weight 72.6 kg, SpO2 99 %. Awake and alert.  Oriented and appropriate.  Motor and sensory function intact.  Wound clean and dry.  Chest and abdomen benign. Disposition: Discharge disposition: 01-Home or Self Care        Allergies as of 09/23/2020      Reactions   Exenatide Other (See Comments)   Linagliptin Other (See Comments)   Lasix [furosemide] Other (See Comments)   Constipation      Medication List    TAKE these medications   Accu-Chek Softclix Lancet Dev Kit   alfuzosin 10 MG 24 hr tablet Commonly known as: UROXATRAL Take 10 mg by mouth daily with breakfast.   amLODipine 10 MG tablet Commonly known as: NORVASC Take 1 tablet (10 mg total) by mouth daily.   aspirin 81 MG tablet Take 81 mg by mouth daily.   CENTRUM SILVER ADULT 50+ PO Take by mouth.   cyclobenzaprine 10 MG tablet Commonly known as: FLEXERIL Take 1 tablet (10 mg total) by mouth 3 (three) times daily as needed for muscle spasms.   Droplet Pen Needles 31G X 5 MM Misc Generic drug: Insulin Pen Needle USE AS DIRECTED TWICE DAILY   furosemide 40 MG tablet Commonly known as: LASIX Take 40 mg by mouth 2 (two) times daily.   gabapentin 300 MG  capsule Commonly known as: NEURONTIN TAKE 1 CAPSULE (300 MG TOTAL) BY MOUTH 3 (THREE) TIMES DAILY AS NEEDED (NERVE PAIN). What changed: when to take this   glucose blood test strip Commonly known as: Accu-Chek Guide Use to check blood sugars 2 times daily   Accu-Chek Guide test strip Generic drug: glucose blood TEST  BLOOD  SUGARS TWICE DAILY   hydrALAZINE 50 MG tablet Commonly known as: APRESOLINE Take 1 tablet (50 mg total) by mouth 3 (three) times daily.   HYDROcodone-acetaminophen 5-325 MG tablet Commonly known as: NORCO/VICODIN Take 1 tablet by mouth every 4 (four) hours as needed for moderate pain ((score 4 to 6)).   losartan 100 MG tablet Commonly known as: COZAAR Take 1 tablet (100 mg total) by mouth daily. What changed: how much to take   NovoLOG Mix 70/30 FlexPen (70-30) 100 UNIT/ML FlexPen Generic drug: insulin aspart protamine - aspart INJECT 10 UNITS UNDER THE SKIN BEFORE BREAKFAST AND 8 UNITS AT DINNER What changed:   how much to take  how to take this  when to take this  additional instructions   Omega-3 1400 MG Caps Take 1,400 mg by mouth daily.   repaglinide 1 MG tablet Commonly known as: PRANDIN TAKE 1 TABLET (1 MG TOTAL) BY MOUTH 2 (TWO) TIMES DAILY BEFORE A MEAL. What changed:   how much to take  how to take this  when to take this  additional instructions   simvastatin 40 MG tablet Commonly known as: ZOCOR Take 1  tablet (40 mg total) by mouth daily at 6 PM.   TRUEplus Lancets 28G Misc TEST BLOOD SUGAR TWICE DAILY   vitamin C 1000 MG tablet Take 1,000 mg by mouth daily.        Signed: Cooper Render  09/23/2020, 4:12 PM

## 2020-09-23 NOTE — H&P (Signed)
Justin Salinas is an 80 y.o. male.   Chief Complaint: Left leg pain HPI: 80 year old male with significant back and left lower extremity pain failing conservative management.  Work-up demonstrates evidence of marked spondylosis with stenosis at L34 worse on the left side causing compression of thecal sac and left L4 nerve root.  Patient has failed conservative management including injections he presents now for decompressive laminotomy and foraminotomy in hopes of improving his symptoms.  Past Medical History:  Diagnosis Date  . Allergy   . Anemia   . Asthma   . Atherosclerosis of abdominal aorta ([place]) 02/26/2020   Seen on x-ray L-spine September 2021  . BPH (benign prostatic hyperplasia)   . CAD (coronary artery disease)    a. s/p MI 09/1993;  b. known CTO of RCA;  c. 06/2014 Cath: LM short/mod dzs, LAD 95ost, LCX nl, RI nl, RCA 100, EF 55%;  c. 06/2014 s/p CABG x 4: LIMA->LAD, VG->D1, VG->OM1, VG->Acute Marginal.  . Carotid stenosis    a. Carotid US 6/00:  RICA 45-99%; LICA 77-41% // Carotid US 1/22: R 1-39; L 40-59; L VA aberrant flow; bilat subclavian stenosis   . CKD (chronic kidney disease), stage III ([place])   . Constipation    in the past 2-3 weeks  . Diabetes ([place])    AODM  . Diastolic dysfunction    a. 05/2015 Echo: EF 60-65%, LVH, Gr 1 DD, mild MR, mod dil RA/LA, mod TR, PASP 69mmHg.  . Essential hypertension   . GERD (gastroesophageal reflux disease)   . History of kidney stones   . Hyperlipidemia     Past Surgical History:  Procedure Laterality Date  . ANGIOPLASTY  09/22/1993   POBA of RCA (Dr. Marella Chimes)  . BACK SURGERY  2000   back fusion - Dr. Earnie Larsson  . CARDIAC CATHETERIZATION  11/28/1998   "silent occlusion" of RCA w/collaterals from left coronary system, prox LAD w/40-50% eccentric narrowing, 1st diagonal with 70-80% eccentric narrowing, 85% narrowing of prox small OM1 (Dr. Marella Chimes)  . CAROTID DOPPLER  07/2011   left subclavian (50-69%); right bulb  (0-49%); RICA (normal); left mid-distal CCA (0-49%); left bulb/prox ICA (50-69%); left vertebral with abnormal antegrade flow  . CORONARY ARTERY BYPASS GRAFT N/A 05/21/2014   Procedure: CORONARY ARTERY BYPASS GRAFTING (CABG) x  four, using left internal mammary artery and right leg greater saphenous vein harvested endoscopically;  Surgeon: Melrose Nakayama, MD;  Location: [facility];  Service: Open Heart Surgery;  Laterality: N/A;  . INTRAOPERATIVE TRANSESOPHAGEAL ECHOCARDIOGRAM N/A 05/21/2014   Procedure: INTRAOPERATIVE TRANSESOPHAGEAL ECHOCARDIOGRAM;  Surgeon: Melrose Nakayama, MD;  Location: [facility];  Service: Open Heart Surgery;  Laterality: N/A;  . LEFT HEART CATHETERIZATION WITH CORONARY ANGIOGRAM N/A 05/19/2014   Procedure: LEFT HEART CATHETERIZATION WITH CORONARY ANGIOGRAM;  Surgeon: Blane Ohara, MD;  Location: [facility] CATH LAB;  Service: Cardiovascular;  Laterality: N/A;  . NASAL SINUS SURGERY  2010  . NM MYOCAR PERF WALL MOTION  09/19/2009   bruce myoview - mild perfusion defect in basal inferior region (infarct/scar), EF 60%, low risk scan  . RENAL DOPPLER  10/2011   SMA w/ 70-99% diameter reduction & high grade stenosis; R&L renals w/narrowing and increased velocities (60-99%), R kidney smaller than L  . TRANSTHORACIC ECHOCARDIOGRAM  10/20/2012   EF 55-60%, moderate concentric hypertrophy, ventricular septum thickness increased, calcified MV annulus  . VASECTOMY  1963    Family History  Problem Relation Age of Onset  . Uterine  cancer Mother   . Stroke Father   . Diabetes Sister   . Colon polyps Brother   . Heart disease Brother   . Colon cancer Neg Hx   . Esophageal cancer Neg Hx   . Rectal cancer Neg Hx   . Stomach cancer Neg Hx    Social History:  reports that he quit smoking about 48 years ago. His smoking use included cigarettes. His smokeless tobacco use includes chew. He reports current alcohol use of about 3.0 standard drinks of alcohol per week. He reports that he does  not use drugs.  Allergies:  Allergies  Allergen Reactions  . Exenatide Other (See Comments)  . Linagliptin Other (See Comments)  . Lasix [Furosemide] Other (See Comments)    Constipation    Medications Prior to Admission  Medication Sig Dispense Refill  . alfuzosin (UROXATRAL) 10 MG 24 hr tablet Take 10 mg by mouth daily with breakfast.    . amLODipine (NORVASC) 10 MG tablet Take 1 tablet (10 mg total) by mouth daily. 90 tablet 3  . aspirin 81 MG tablet Take 81 mg by mouth daily.    . furosemide (LASIX) 40 MG tablet Take 40 mg by mouth 2 (two) times daily.    . hydrALAZINE (APRESOLINE) 50 MG tablet Take 1 tablet (50 mg total) by mouth 3 (three) times daily. 270 tablet 3  . insulin aspart protamine - aspart (NOVOLOG MIX 70/30 FLEXPEN) (70-30) 100 UNIT/ML FlexPen INJECT 10 UNITS UNDER THE SKIN BEFORE BREAKFAST AND 8 UNITS AT DINNER (Patient taking differently: Inject 8 Units into the skin 2 (two) times daily with a meal.) 30 mL 1  . losartan (COZAAR) 100 MG tablet Take 1 tablet (100 mg total) by mouth daily. (Patient taking differently: Take 50 mg by mouth daily.) 90 tablet 3  . repaglinide (PRANDIN) 1 MG tablet TAKE 1 TABLET (1 MG TOTAL) BY MOUTH 2 (TWO) TIMES DAILY BEFORE A MEAL. (Patient taking differently: Take 1 mg by mouth 2 (two) times daily before a meal.) 180 tablet 2  . simvastatin (ZOCOR) 40 MG tablet Take 1 tablet (40 mg total) by mouth daily at 6 PM. 90 tablet 3  . ACCU-CHEK GUIDE test strip TEST  BLOOD  SUGARS TWICE DAILY 200 strip 3  . Ascorbic Acid (VITAMIN C) 1000 MG tablet Take 1,000 mg by mouth daily.    . DROPLET PEN NEEDLES 31G X 5 MM MISC USE AS DIRECTED TWICE DAILY 200 each 2  . gabapentin (NEURONTIN) 300 MG capsule TAKE 1 CAPSULE (300 MG TOTAL) BY MOUTH 3 (THREE) TIMES DAILY AS NEEDED (NERVE PAIN). (Patient taking differently: Take 300 mg by mouth 3 (three) times daily.) 90 capsule 1  . glucose blood (ACCU-CHEK GUIDE) test strip Use to check blood sugars 2 times daily  200 each 1  . Lancets Misc. (ACCU-CHEK SOFTCLIX LANCET DEV) KIT     . Multiple Vitamins-Minerals (CENTRUM SILVER ADULT 50+ PO) Take by mouth.    . Omega-3 1400 MG CAPS Take 1,400 mg by mouth daily.    . TRUEplus Lancets 28G MISC TEST BLOOD SUGAR TWICE DAILY 200 each 5    Results for orders placed or performed during the hospital encounter of 09/23/20 (from the past 48 hour(s))  Basic metabolic panel     Status: Abnormal   Collection Time: 09/23/20  5:51 AM  Result Value Ref Range   Sodium 139 135 - 145 mmol/L   Potassium 4.0 3.5 - 5.1 mmol/L   Chloride 106 98 -  111 mmol/L   CO2 26 22 - 32 mmol/L   Glucose, Bld 35 (LL) 70 - 99 mg/dL    Comment: Glucose reference range applies only to samples taken after fasting for at least 8 hours. CRITICAL RESULT CALLED TO, READ BACK BY AND VERIFIED WITH: Jillyn Ledger RN [id number] BY A BENNETT    BUN 76 (H) 8 - 23 mg/dL   Creatinine, Ser 2.54 (H) 0.61 - 1.24 mg/dL   Calcium 9.1 8.9 - 10.3 mg/dL   GFR, Estimated 25 (L) >60 mL/min    Comment: (NOTE) Calculated using the CKD-EPI Creatinine Equation (2021)    Anion gap 7 5 - 15    Comment: Performed at [facility] [address], [place], [place] [address]  CBC WITH DIFFERENTIAL     Status: Abnormal   Collection Time: 09/23/20  5:51 AM  Result Value Ref Range   WBC 6.4 4.0 - 10.5 K/uL   RBC 3.19 (L) 4.22 - 5.81 MIL/uL   Hemoglobin 10.5 (L) 13.0 - 17.0 g/dL   HCT 32.6 (L) 39.0 - 52.0 %   MCV 102.2 (H) 80.0 - 100.0 fL   MCH 32.9 26.0 - 34.0 pg   MCHC 32.2 30.0 - 36.0 g/dL   RDW 12.1 11.5 - 15.5 %   Platelets 130 (L) 150 - 400 K/uL   nRBC 0.0 0.0 - 0.2 %   Neutrophils Relative % 46 %   Neutro Abs 3.0 1.7 - 7.7 K/uL   Lymphocytes Relative 33 %   Lymphs Abs 2.1 0.7 - 4.0 K/uL   Monocytes Relative 9 %   Monocytes Absolute 0.6 0.1 - 1.0 K/uL   Eosinophils Relative 11 %   Eosinophils Absolute 0.7 (H) 0.0 - 0.5 K/uL   Basophils Relative 1 %   Basophils Absolute 0.1 0.0 - 0.1 K/uL    Immature Granulocytes 0 %   Abs Immature Granulocytes 0.02 0.00 - 0.07 K/uL    Comment: Performed at [facility], [address], [place], [place] [address]  Glucose, capillary     Status: Abnormal   Collection Time: 09/23/20  7:36 AM  Result Value Ref Range   Glucose-Capillary 51 (L) 70 - 99 mg/dL    Comment: Glucose reference range applies only to samples taken after fasting for at least 8 hours.   Comment 1 Notify RN    Comment 2 Document in Chart   Glucose, capillary     Status: Abnormal   Collection Time: 09/23/20  7:59 AM  Result Value Ref Range   Glucose-Capillary 157 (H) 70 - 99 mg/dL    Comment: Glucose reference range applies only to samples taken after fasting for at least 8 hours.   No results found.  Pertinent items noted in HPI and remainder of comprehensive ROS otherwise negative.  Blood pressure (!) 174/63, pulse 62, temperature (!) 97.4 F (36.3 C), temperature source Oral, resp. rate 18, height $RemoveBe'5\' 10"'PAcDTDUNq$  (1.778 m), weight 72.6 kg, SpO2 98 %.  Patient is awake and alert.  He is oriented and appropriate.  Speech is fluent.  Judgment insight are intact.  Cranial nerve function normal bilateral.  Motor examination reveals some mild weakness physical quadriceps muscle group on the left side otherwise motor strength intact.  Sensory examination with some decrease sensation pinprick light touch in his left L4 dermatome.  Deep tender versus normal active scepter his Achilles reflexes are absent.  No evidence of long track signs.  Gait antalgic.  Posture mildly flexed.  Examination head ears eyes nose throat is unremarkable chest and abdomen benign.  Extremities are free from injury deformity. Assessment/Plan L3-4 stenosis with radiculopathy.  Plan left L3-4 decompressive laminotomy with foraminotomies.  Risks and benefits of been explained.  Patient wishes to proceed.  Mallie Mussel A Treonna Klee 09/23/2020, 8:01 AM

## 2020-09-23 NOTE — Op Note (Signed)
Date of procedure: 09/23/2020  Date of dictation: Same  Service: Neurosurgery  Preoperative diagnosis: Left L3-L4 spondylosis with stenosis and radiculopathy  Postoperative diagnosis: Same  Procedure Name: Left L3-4 decompressive laminotomy and foraminotomies  Surgeon:Pavan Bring A.Geordan Xu, M.D.  Asst. Surgeon: Reinaldo Meeker, NP  Anesthesia: General  Indication: 80 year old male with left lower extremity radicular pain consistent primarily with a left-sided L4 radiculopathy.  Work-up demonstrates evidence of disc generation with associated spondylosis and resultant stenosis causing compression of thecal sac and left L4 nerve root as well as foraminal stenosis compromising the left L3 nerve root.  Patient is failed conservative management presents now for lumbar decompressive surgery in hopes of improving his symptoms.  Operative note: After induction of anesthesia, patient positioned prone on the Wilson frame appropriate padded.  Lumbar region prepped and draped sterilely.  Incision made overlying L3-4.  Dissection performed on the left.  Retractor placed.  X-ray taken.  Level confirmed.  Laminotomy performed using high-speed drill and Kerrison rongeurs.  Ligament flavum elevated and resected.  Underlying thecal sac and left L4 nerve root identified.  Foraminotomies completed on the course exiting L4 nerve root.  The facet joints undercut and foraminotomies completed on the course the exiting L3 nerve root.  At this point a very thorough decompression had been achieved.  There was no evidence of injury to the thecal sac or nerve roots.  The wound was then irrigated with antibiotic solution.  Gelfoam was placed over the laminotomy defect.  Wounds then closed in layers with Vicryl sutures.  Steri-Strips sterile dressing were applied.  No apparent complications.  Patient tolerated the procedure well and he returns to the recovery room postop.

## 2020-09-23 NOTE — Anesthesia Procedure Notes (Signed)
Procedure Name: Intubation Date/Time: 09/23/2020 8:27 AM Performed by: Roderic Palau, MD Pre-anesthesia Checklist: Patient identified, Emergency Drugs available, Suction available and Patient being monitored Patient Re-evaluated:Patient Re-evaluated prior to induction Oxygen Delivery Method: Circle system utilized Preoxygenation: Pre-oxygenation with 100% oxygen Induction Type: IV induction Ventilation: Mask ventilation without difficulty and Oral airway inserted - appropriate to patient size Laryngoscope Size: Mac and 4 Grade View: Grade III Tube type: Oral Tube size: 7.5 mm Number of attempts: 1 Airway Equipment and Method: Oral airway and Bougie stylet Placement Confirmation: ETT inserted through vocal cords under direct vision,  positive ETCO2 and breath sounds checked- equal and bilateral Secured at: 22 cm Tube secured with: Tape Dental Injury: Teeth and Oropharynx as per pre-operative assessment  Comments: Limited view achieved with a MAC 4, inserted with Bougie by Dr. Ola Spurr

## 2020-09-23 NOTE — Transfer of Care (Signed)
Immediate Anesthesia Transfer of Care Note  Patient: Justin Salinas  Procedure(s) Performed: Laminectomy and Foraminotomy - left - Lumbar three-Lumbar four (Left Spine Lumbar)  Patient Location: PACU  Anesthesia Type:General  Level of Consciousness: awake, alert  and oriented  Airway & Oxygen Therapy: Patient Spontanous Breathing  Post-op Assessment: Report given to RN and Post -op Vital signs reviewed and stable  Post vital signs: Reviewed and stable  Last Vitals:  Vitals Value Taken Time  BP 170/60 09/23/20 0944  Temp    Pulse 64 09/23/20 0948  Resp 21 09/23/20 0948  SpO2 92 % 09/23/20 0948  Vitals shown include unvalidated device data.  Last Pain:  Vitals:   09/23/20 0647  TempSrc:   PainSc: 0-No pain         Complications: No complications documented.

## 2020-09-23 NOTE — Anesthesia Postprocedure Evaluation (Signed)
Anesthesia Post Note  Patient: Justin Salinas  Procedure(s) Performed: Laminectomy and Foraminotomy - left - Lumbar three-Lumbar four (Left Spine Lumbar)     Patient location during evaluation: PACU Anesthesia Type: General Level of consciousness: awake and alert Pain management: pain level controlled Vital Signs Assessment: post-procedure vital signs reviewed and stable Respiratory status: spontaneous breathing, nonlabored ventilation and respiratory function stable Cardiovascular status: blood pressure returned to baseline and stable Postop Assessment: no apparent nausea or vomiting Anesthetic complications: no   No complications documented.  Last Vitals:  Vitals:   09/23/20 1038 09/23/20 1059  BP: (!) 163/59 (!) 172/60  Pulse: 64 66  Resp: 16 17  Temp: (!) 36.1 C 36.4 C  SpO2: 95% 99%    Last Pain:  Vitals:   09/23/20 1059  TempSrc: Oral  PainSc:                  Izora Benn,W. EDMOND

## 2020-09-23 NOTE — Evaluation (Signed)
Physical Therapy Evaluation Patient Details Name: Justin Salinas MRN: [id number] DOB: 1941/04/26 Today's Date: 09/23/2020   History of Present Illness  PT is an 80 y/o male s/p L3-4 decompressive laminotomy and foraminotomies. PMH includes asthma, CAD, CKD, DM, and HTN.  Clinical Impression  Patient evaluated by Physical Therapy with no further acute PT needs identified. All education has been completed and the patient has no further questions. Pt requiring min guard to supervision for safety using RW. No overt LOB noted. Educated about using RW at home for increased safety. Told OT he did not have one, but told PT he did. If he does not have one, will need for d/c. Educated about back precautions and walking program for home. See below for any follow-up Physical Therapy or equipment needs. PT is signing off. Thank you for this referral. If needs change, please re-consult.      Follow Up Recommendations No PT follow up    Equipment Recommendations  Rolling walker with 5" wheels (needs RW if he does not have.)    Recommendations for Other Services       Precautions / Restrictions Precautions Precautions: Back Precaution Booklet Issued: Yes (comment) Precaution Comments: REviewed back precautions with pt. Restrictions Weight Bearing Restrictions: No      Mobility  Bed Mobility Overal bed mobility: Needs Assistance Bed Mobility: Rolling;Sidelying to Sit;Sit to Sidelying Rolling: Supervision Sidelying to sit: Supervision     Sit to sidelying: Supervision General bed mobility comments: Supervision for safety. Cues for log roll technique.    Transfers Overall transfer level: Needs assistance Equipment used: Rolling walker (2 wheeled) Transfers: Sit to/from Stand Sit to Stand: Supervision         General transfer comment: Supervision for safety.  Ambulation/Gait Ambulation/Gait assistance: Supervision Gait Distance (Feet): 100 Feet Assistive device: Rolling walker  (2 wheeled) Gait Pattern/deviations: Step-through pattern;Decreased stride length Gait velocity: Decreased   General Gait Details: Slow, cautious gait. Cues for sequencing using RW. Educated about using RW at home to increase safety with mobility. Educated about generalized walking program to perform at home.  Stairs            Wheelchair Mobility    Modified Rankin (Stroke Patients Only)       Balance Overall balance assessment: Needs assistance Sitting-balance support: No upper extremity supported;Feet supported Sitting balance-Leahy Scale: Fair     Standing balance support: Bilateral upper extremity supported;During functional activity Standing balance-Leahy Scale: Poor Standing balance comment: Reliant on BUE support                             Pertinent Vitals/Pain Pain Assessment: Faces Faces Pain Scale: Hurts a little bit Pain Location: back Pain Descriptors / Indicators: Aching;Operative site guarding Pain Intervention(s): Limited activity within patient's tolerance;Monitored during session;Repositioned    Home Living Family/patient expects to be discharged to:: Private residence Living Arrangements: Alone Available Help at Discharge: Family;Available 24 hours/day Type of Home: House Home Access: Stairs to enter Entrance Stairs-Rails: None Entrance Stairs-Number of Steps: 2 Home Layout: Two level Home Equipment: Cane - single point;Walker - 2 wheels Additional Comments: Pt told OT he did not have RW.    Prior Function Level of Independence: Independent               Hand Dominance        Extremity/Trunk Assessment   Upper Extremity Assessment Upper Extremity Assessment: Defer to OT evaluation    Lower  Extremity Assessment Lower Extremity Assessment: Generalized weakness    Cervical / Trunk Assessment Cervical / Trunk Assessment: Other exceptions Cervical / Trunk Exceptions: s/p lumbar surgery  Communication    Communication: HOH  Cognition Arousal/Alertness: Awake/alert Behavior During Therapy: WFL for tasks assessed/performed Overall Cognitive Status: No family/caregiver present to determine baseline cognitive functioning                                        General Comments      Exercises     Assessment/Plan    PT Assessment Patent does not need any further PT services  PT Problem List         PT Treatment Interventions      PT Goals (Current goals can be found in the Care Plan section)  Acute Rehab PT Goals Patient Stated Goal: to go home PT Goal Formulation: With patient Time For Goal Achievement: 09/23/20 Potential to Achieve Goals: Good    Frequency     Barriers to discharge        Co-evaluation               AM-PAC PT "6 Clicks" Mobility  Outcome Measure Help needed turning from your back to your side while in a flat bed without using bedrails?: None Help needed moving from lying on your back to sitting on the side of a flat bed without using bedrails?: None Help needed moving to and from a bed to a chair (including a wheelchair)?: None Help needed standing up from a chair using your arms (e.g., wheelchair or bedside chair)?: None Help needed to walk in hospital room?: None Help needed climbing 3-5 steps with a railing? : A Little 6 Click Score: 23    End of Session Equipment Utilized During Treatment: Gait belt Activity Tolerance: Patient tolerated treatment well Patient left: in bed;with call bell/phone within reach;with family/visitor present Nurse Communication: Mobility status PT Visit Diagnosis: Other abnormalities of gait and mobility (R26.89);Pain Pain - part of body:  (back)    Time: 8127-5170 PT Time Calculation (min) (ACUTE ONLY): 16 min   Charges:   PT Evaluation $PT Eval Low Complexity: 1 Low          Lou Miner, DPT  Acute Rehabilitation Services  Pager: [phone] Office: [phone]   Rudean Hitt 09/23/2020, 4:41 PM

## 2020-09-24 ENCOUNTER — Encounter (HOSPITAL_COMMUNITY): Payer: Self-pay | Admitting: Neurosurgery

## 2020-10-04 DIAGNOSIS — E1122 Type 2 diabetes mellitus with diabetic chronic kidney disease: Secondary | ICD-10-CM | POA: Diagnosis not present

## 2020-10-04 DIAGNOSIS — N183 Chronic kidney disease, stage 3 unspecified: Secondary | ICD-10-CM | POA: Diagnosis not present

## 2020-10-04 DIAGNOSIS — N400 Enlarged prostate without lower urinary tract symptoms: Secondary | ICD-10-CM | POA: Diagnosis not present

## 2020-10-04 DIAGNOSIS — I129 Hypertensive chronic kidney disease with stage 1 through stage 4 chronic kidney disease, or unspecified chronic kidney disease: Secondary | ICD-10-CM | POA: Diagnosis not present

## 2020-10-04 DIAGNOSIS — N2889 Other specified disorders of kidney and ureter: Secondary | ICD-10-CM | POA: Diagnosis not present

## 2020-10-04 DIAGNOSIS — D631 Anemia in chronic kidney disease: Secondary | ICD-10-CM | POA: Diagnosis not present

## 2020-10-07 ENCOUNTER — Other Ambulatory Visit: Payer: Self-pay | Admitting: Nephrology

## 2020-10-07 DIAGNOSIS — N2889 Other specified disorders of kidney and ureter: Secondary | ICD-10-CM

## 2020-10-19 ENCOUNTER — Other Ambulatory Visit: Payer: Medicare HMO

## 2020-10-25 ENCOUNTER — Ambulatory Visit: Payer: Medicare HMO | Admitting: Endocrinology

## 2020-10-25 ENCOUNTER — Other Ambulatory Visit: Payer: Self-pay

## 2020-10-25 ENCOUNTER — Encounter: Payer: Self-pay | Admitting: Endocrinology

## 2020-10-25 VITALS — BP 150/68 | HR 79 | Ht 70.0 in | Wt 161.2 lb

## 2020-10-25 DIAGNOSIS — Z794 Long term (current) use of insulin: Secondary | ICD-10-CM | POA: Diagnosis not present

## 2020-10-25 DIAGNOSIS — E1165 Type 2 diabetes mellitus with hyperglycemia: Secondary | ICD-10-CM

## 2020-10-25 NOTE — Progress Notes (Signed)
Patient ID: Justin Salinas, male   DOB: 08-Sep-1940, 80 y.o.   MRN: [id number]           Reason for Appointment: Follow-up for Type 2 Diabetes    History of Present Illness:          Date of diagnosis of type 2 diabetes mellitus:2000        Background history:   His diabetes had been mild initially and treated with metformin He had subsequently other medications added including Amaryl and Januvia No details are available from PCP office, [name] in 2015 was 7.6  Because of his poor control was started on insulin on 01/03/17. He has been on Actos since 06/2017+ He had a baseline A1c A1c 9.7 on his initial consultation in 01/2017  Recent history:   Insulin regimen: NovoLog mix, 8 units at breakfast and 8 at supper Prandin 1 mg at breakfast and lunch daily  A1c is lower than expected at 5.9 last month Last fructosamine was 340, usually over 320  Current management, blood sugar patterns and problems identified:   He is taking 8 units of insulin in the morning, as directed, previously on 10 units  He thinks he is taking his Prandin at breakfast and lunch  However his diet may be quite variable again occasionally has high readings at dinnertime from snacks or higher carbohydrate meals  Possibly because of his back surgery he appears to have lost weight  He does try to take insulin before breakfast and dinner consistently  However he continues to forget to check readings after meals and only checks them when he takes his insulin  No hypoglycemic symptoms reported   Side effects from medications have been: None  Compliance with the medical regimen: Fair  Glucose monitoring:  done 2 times a day         Glucometer:  Accu-Chek guide me .      Blood Glucose readings by download of monitor:   PRE-MEAL Fasting Lunch Dinner Bedtime Overall  Glucose range: 89-142  78-231    Mean/median: 110  140  123      PRE-MEAL Fasting Lunch Dinner Bedtime Overall  Glucose range:   86-115   83-340    Mean/median:  102   138   119   POST-MEAL PC Breakfast PC Lunch PC Dinner  Glucose range:     Mean/median:         Self-care: The diet that the patient has been following is: tries to limit sweets, fried food       Typical meal intake: Breakfast is eggs,  Oatmeal/ meat, toast.  Lunch: Sandwich or.Crackers with or without peanut butter usually                Dietician visit, most recent: 03/2017   Weight history:   Wt Readings from Last 3 Encounters:  10/25/20 161 lb 3.2 oz (73.1 kg)  09/23/20 160 lb (72.6 kg)  06/24/20 166 lb 3.2 oz (75.4 kg)    Glycemic control:   Lab Results  Component Value Date   HGBA1C 5.9 (H) 09/23/2020   HGBA1C 5.2 06/21/2020   HGBA1C 5.7 (A) 02/23/2020   Lab Results  Component Value Date   MICROALBUR <0.7 04/27/2019   LDLCALC 65 02/23/2020   CREATININE 2.54 (H) 09/23/2020   Lab Results  Component Value Date   MICRALBCREAT 0.6 04/27/2019   Microalbumin ratio 9 in 12/2015  Lab Results  Component Value Date   FRUCTOSAMINE 340 (H)  06/21/2020   FRUCTOSAMINE 340 (H) 02/23/2020   FRUCTOSAMINE 352 (H) 09/21/2019      Allergies as of 10/25/2020      Reactions   Exenatide Other (See Comments)   Linagliptin Other (See Comments)   Lasix [furosemide] Other (See Comments)   Constipation      Medication List       Accurate as of Oct 25, 2020  9:17 AM. If you have any questions, ask your nurse or doctor.        Accu-Chek Softclix Lancet Dev Kit   alfuzosin 10 MG 24 hr tablet Commonly known as: UROXATRAL Take 10 mg by mouth daily with breakfast.   amLODipine 10 MG tablet Commonly known as: NORVASC Take 1 tablet (10 mg total) by mouth daily.   aspirin 81 MG tablet Take 81 mg by mouth daily.   CENTRUM SILVER ADULT 50+ PO Take by mouth.   cyclobenzaprine 10 MG tablet Commonly known as: FLEXERIL Take 1 tablet (10 mg total) by mouth 3 (three) times daily as needed for muscle spasms.   doxycycline 100 MG  tablet Commonly known as: VIBRA-TABS Take by mouth.   Droplet Pen Needles 31G X 5 MM Misc Generic drug: Insulin Pen Needle USE AS DIRECTED TWICE DAILY   furosemide 40 MG tablet Commonly known as: LASIX Take 40 mg by mouth 2 (two) times daily.   gabapentin 300 MG capsule Commonly known as: NEURONTIN TAKE 1 CAPSULE (300 MG TOTAL) BY MOUTH 3 (THREE) TIMES DAILY AS NEEDED (NERVE PAIN). What changed: when to take this   glucose blood test strip Commonly known as: Accu-Chek Guide Use to check blood sugars 2 times daily   Accu-Chek Guide test strip Generic drug: glucose blood TEST  BLOOD  SUGARS TWICE DAILY   hydrALAZINE 50 MG tablet Commonly known as: APRESOLINE Take 1 tablet (50 mg total) by mouth 3 (three) times daily.   HYDROcodone-acetaminophen 5-325 MG tablet Commonly known as: NORCO/VICODIN Take 1 tablet by mouth every 4 (four) hours as needed for moderate pain ((score 4 to 6)).   losartan 100 MG tablet Commonly known as: COZAAR Take 1 tablet (100 mg total) by mouth daily. What changed: how much to take   NovoLOG Mix 70/30 FlexPen (70-30) 100 UNIT/ML FlexPen Generic drug: insulin aspart protamine - aspart INJECT 10 UNITS UNDER THE SKIN BEFORE BREAKFAST AND 8 UNITS AT DINNER What changed:   how much to take  how to take this  when to take this  additional instructions   Omega-3 1400 MG Caps Take 1,400 mg by mouth daily.   repaglinide 1 MG tablet Commonly known as: PRANDIN TAKE 1 TABLET (1 MG TOTAL) BY MOUTH 2 (TWO) TIMES DAILY BEFORE A MEAL. What changed:   how much to take  how to take this  when to take this  additional instructions   simvastatin 40 MG tablet Commonly known as: ZOCOR Take 1 tablet (40 mg total) by mouth daily at 6 PM.   TRUEplus Lancets 28G Misc TEST BLOOD SUGAR TWICE DAILY   vitamin C 1000 MG tablet Take 1,000 mg by mouth daily.       Allergies:  Allergies  Allergen Reactions  . Exenatide Other (See Comments)  .  Linagliptin Other (See Comments)  . Lasix [Furosemide] Other (See Comments)    Constipation    Past Medical History:  Diagnosis Date  . Allergy   . Anemia   . Asthma   . Atherosclerosis of abdominal aorta ([place]) 02/26/2020  Seen on x-ray L-spine September 2021  . BPH (benign prostatic hyperplasia)   . CAD (coronary artery disease)    a. s/p MI 09/1993;  b. known CTO of RCA;  c. 06/2014 Cath: LM short/mod dzs, LAD 95ost, LCX nl, RI nl, RCA 100, EF 55%;  c. 06/2014 s/p CABG x 4: LIMA->LAD, VG->D1, VG->OM1, VG->Acute Marginal.  . Carotid stenosis    a. Carotid US 6/50:  RICA 35-46%; LICA 56-81% // Carotid US 1/22: R 1-39; L 40-59; L VA aberrant flow; bilat subclavian stenosis   . CKD (chronic kidney disease), stage III ([place])   . Constipation    in the past 2-3 weeks  . Diabetes ([place])    AODM  . Diastolic dysfunction    a. 05/2015 Echo: EF 60-65%, LVH, Gr 1 DD, mild MR, mod dil RA/LA, mod TR, PASP 56mmHg.  . Essential hypertension   . GERD (gastroesophageal reflux disease)   . History of kidney stones   . Hyperlipidemia     Past Surgical History:  Procedure Laterality Date  . ANGIOPLASTY  09/22/1993   POBA of RCA (Dr. Marella Chimes)  . BACK SURGERY  2000   back fusion - Dr. Earnie Larsson  . CARDIAC CATHETERIZATION  11/28/1998   "silent occlusion" of RCA w/collaterals from left coronary system, prox LAD w/40-50% eccentric narrowing, 1st diagonal with 70-80% eccentric narrowing, 85% narrowing of prox small OM1 (Dr. Marella Chimes)  . CAROTID DOPPLER  07/2011   left subclavian (50-69%); right bulb (0-49%); RICA (normal); left mid-distal CCA (0-49%); left bulb/prox ICA (50-69%); left vertebral with abnormal antegrade flow  . CORONARY ARTERY BYPASS GRAFT N/A 05/21/2014   Procedure: CORONARY ARTERY BYPASS GRAFTING (CABG) x  four, using left internal mammary artery and right leg greater saphenous vein harvested endoscopically;  Surgeon: Melrose Nakayama, MD;  Location: [facility];  Service: Open  Heart Surgery;  Laterality: N/A;  . INTRAOPERATIVE TRANSESOPHAGEAL ECHOCARDIOGRAM N/A 05/21/2014   Procedure: INTRAOPERATIVE TRANSESOPHAGEAL ECHOCARDIOGRAM;  Surgeon: Melrose Nakayama, MD;  Location: [facility];  Service: Open Heart Surgery;  Laterality: N/A;  . LEFT HEART CATHETERIZATION WITH CORONARY ANGIOGRAM N/A 05/19/2014   Procedure: LEFT HEART CATHETERIZATION WITH CORONARY ANGIOGRAM;  Surgeon: Blane Ohara, MD;  Location: [facility] CATH LAB;  Service: Cardiovascular;  Laterality: N/A;  . LUMBAR LAMINECTOMY/DECOMPRESSION MICRODISCECTOMY Left 09/23/2020   Procedure: Laminectomy and Foraminotomy - left - Lumbar three-Lumbar four;  Surgeon: Earnie Larsson, MD;  Location: [facility];  Service: Neurosurgery;  Laterality: Left;  . NASAL SINUS SURGERY  2010  . NM MYOCAR PERF WALL MOTION  09/19/2009   bruce myoview - mild perfusion defect in basal inferior region (infarct/scar), EF 60%, low risk scan  . RENAL DOPPLER  10/2011   SMA w/ 70-99% diameter reduction & high grade stenosis; R&L renals w/narrowing and increased velocities (60-99%), R kidney smaller than L  . TRANSTHORACIC ECHOCARDIOGRAM  10/20/2012   EF 55-60%, moderate concentric hypertrophy, ventricular septum thickness increased, calcified MV annulus  . VASECTOMY  1963    Family History  Problem Relation Age of Onset  . Uterine cancer Mother   . Stroke Father   . Diabetes Sister   . Colon polyps Brother   . Heart disease Brother   . Colon cancer Neg Hx   . Esophageal cancer Neg Hx   . Rectal cancer Neg Hx   . Stomach cancer Neg Hx     Social History:  reports that he quit smoking about 48 years ago. His smoking use included  cigarettes. His smokeless tobacco use includes chew. He reports current alcohol use of about 3.0 standard drinks of alcohol per week. He reports that he does not use drugs.   Review of Systems   Lipid history: Taking simvastatin 40 mg, most recent LDL as follows This is prescribed by cardiologist    Lab Results   Component Value Date   CHOL 139 02/23/2020   HDL 60.60 02/23/2020   LDLCALC 65 02/23/2020   LDLDIRECT 45.0 04/17/2017   TRIG 65.0 02/23/2020   CHOLHDL 2 02/23/2020           Hypertension: Blood pressure is treated with hydralazine, losartan 50 and he is also on Lasix and amlodipine Followed by nephrologist   BP Readings from Last 3 Encounters:  10/25/20 (!) 150/68  09/23/20 (!) 172/60  06/24/20 (!) 142/50   CKD: Renal function has been as follows  Has been given Lasix for edema, followed by nephrologist every 3 months  Lab Results  Component Value Date   CREATININE 2.54 (H) 09/23/2020   CREATININE 2.12 (H) 06/21/2020   CREATININE 1.69 (H) 02/23/2020     Most recent eye exam was 1/21  Most recent foot exam: 9/21   LABS:  No visits with results within 1 Week(s) from this visit.  Latest known visit with results is:  Admission on 09/23/2020, Discharged on 09/23/2020  Component Date Value Ref Range Status  . Sodium 09/23/2020 139  135 - 145 mmol/L Final  . Potassium 09/23/2020 4.0  3.5 - 5.1 mmol/L Final  . Chloride 09/23/2020 106  98 - 111 mmol/L Final  . CO2 09/23/2020 26  22 - 32 mmol/L Final  . Glucose, Bld 09/23/2020 35* 70 - 99 mg/dL Final   Comment: Glucose reference range applies only to samples taken after fasting for at least 8 hours. CRITICAL RESULT CALLED TO, READ BACK BY AND VERIFIED WITH: Jillyn Ledger RN [phone] [id number] BY A BENNETT   . BUN 09/23/2020 76* 8 - 23 mg/dL Final  . Creatinine, Ser 09/23/2020 2.54* 0.61 - 1.24 mg/dL Final  . Calcium 09/23/2020 9.1  8.9 - 10.3 mg/dL Final  . GFR, Estimated 09/23/2020 25* >60 mL/min Final   Comment: (NOTE) Calculated using the CKD-EPI Creatinine Equation (2021)   . Anion gap 09/23/2020 7  5 - 15 Final   Performed at [facility] [address] [place], [place] [address]  . WBC 09/23/2020 6.4  4.0 - 10.5 K/uL Final  . RBC 09/23/2020 3.19* 4.22 - 5.81 MIL/uL Final  . Hemoglobin 09/23/2020 10.5* 13.0 -  17.0 g/dL Final  . HCT 09/23/2020 32.6* 39.0 - 52.0 % Final  . MCV 09/23/2020 102.2* 80.0 - 100.0 fL Final  . MCH 09/23/2020 32.9  26.0 - 34.0 pg Final  . MCHC 09/23/2020 32.2  30.0 - 36.0 g/dL Final  . RDW 09/23/2020 12.1  11.5 - 15.5 % Final  . Platelets 09/23/2020 130* 150 - 400 K/uL Final  . nRBC 09/23/2020 0.0  0.0 - 0.2 % Final  . Neutrophils Relative % 09/23/2020 46  % Final  . Neutro Abs 09/23/2020 3.0  1.7 - 7.7 K/uL Final  . Lymphocytes Relative 09/23/2020 33  % Final  . Lymphs Abs 09/23/2020 2.1  0.7 - 4.0 K/uL Final  . Monocytes Relative 09/23/2020 9  % Final  . Monocytes Absolute 09/23/2020 0.6  0.1 - 1.0 K/uL Final  . Eosinophils Relative 09/23/2020 11  % Final  . Eosinophils Absolute 09/23/2020 0.7* 0.0 - 0.5 K/uL  Final  . Basophils Relative 09/23/2020 1  % Final  . Basophils Absolute 09/23/2020 0.1  0.0 - 0.1 K/uL Final  . Immature Granulocytes 09/23/2020 0  % Final  . Abs Immature Granulocytes 09/23/2020 0.02  0.00 - 0.07 K/uL Final   Performed at [facility], [place] [address] [place], [place] [address]  . MRSA, PCR 09/23/2020 NEGATIVE  NEGATIVE Final  . Staphylococcus aureus 09/23/2020 POSITIVE* NEGATIVE Final   Comment: (NOTE) The Xpert SA Assay (FDA approved for NASAL specimens in patients 65 years of age and older), is one component of a comprehensive surveillance program. It is not intended to diagnose infection nor to guide or monitor treatment. Performed at [facility], [place] [address] [place], [place] [address]   . Glucose-Capillary 09/23/2020 51* 70 - 99 mg/dL Final   Glucose reference range applies only to samples taken after fasting for at least 8 hours.  . Comment 1 09/23/2020 Notify RN   Final  . Comment 2 09/23/2020 Document in Chart   Final  . Glucose-Capillary 09/23/2020 157* 70 - 99 mg/dL Final   Glucose reference range applies only to samples taken after fasting for at least 8 hours.  . Glucose-Capillary 09/23/2020 125* 70 - 99  mg/dL Final   Glucose reference range applies only to samples taken after fasting for at least 8 hours.  . Hgb A1c MFr Bld 09/23/2020 5.9* 4.8 - 5.6 % Final   Comment: (NOTE) Pre diabetes:          5.7%-6.4%  Diabetes:              >6.4%  Glycemic control for   <7.0% adults with diabetes   . Mean Plasma Glucose 09/23/2020 122.63  mg/dL Final   Performed at [facility] [address], [place], [place] [address]  . Glucose-Capillary 09/23/2020 174* 70 - 99 mg/dL Final   Glucose reference range applies only to samples taken after fasting for at least 8 hours.  . Comment 1 09/23/2020 Notify RN   Final  . Comment 2 09/23/2020 Document in Chart   Final    Physical Examination:  BP (!) 150/68 (BP Location: Left Arm, Patient Position: Sitting, Cuff Size: Normal)   Pulse 79   Ht $R'5\' 10"'hD$  (1.778 m)   Wt 161 lb 3.2 oz (73.1 kg)   SpO2 97%   BMI 23.13 kg/m     ASSESSMENT:  Diabetes type 2, insulin requiring  See history of present illness for discussion of current diabetes management, blood sugar patterns and problems identified  He is on a regimen of Prandin and low-dose premixed insulin twice a day  [name] is 5.9 and lower than expected for his home blood sugar average of 123 However he has mostly readings only before meals at breakfast and dinner  Considering his age and comorbid conditions his level of control is excellent and with low-dose insulin he is comfortable Injections twice a day Has lost weight from decreased intake, possibly related to renal dysfunction as well as recent surgery   PLAN:   Given written instructions to check readings after supper alternating with before dinner Does not need to check every morning No change in Prandin or insulin To call if he has any consistently high or low readings    There are no Patient Instructions on file for this visit.   Elayne Snare 10/25/2020, 9:17 AM   Note: This office note was prepared with Dragon voice  recognition system  technology. Any transcriptional errors that result from this process are unintentional.

## 2020-10-25 NOTE — Patient Instructions (Signed)
SAME INSULIN DOSES  Take Repeglanide before Bfst and lunch  Check blood sugars on waking up 4-5 days a week  Also check blood sugars about 2 hours after meals and do this after different meals by rotation  Recommended blood sugar levels on waking up are 90-130 and about 2 hours after meal is 130-180  Please bring your blood sugar monitor to each visit, thank you  Bedtime snack

## 2020-10-28 ENCOUNTER — Other Ambulatory Visit: Payer: Self-pay | Admitting: Family Medicine

## 2020-10-28 MED ORDER — GABAPENTIN 300 MG PO CAPS: 300.0000 mg | ORAL_CAPSULE | Freq: Three times a day (TID) | ORAL | 1 refills | Status: DC | PRN

## 2020-10-28 NOTE — Telephone Encounter (Signed)
Received faxed prescription request for gabapentin from [facility].

## 2020-11-03 ENCOUNTER — Ambulatory Visit
Admission: RE | Admit: 2020-11-03 | Discharge: 2020-11-03 | Disposition: A | Discharge status: Home / Self Care | Payer: Medicare HMO | Source: Ambulatory Visit | Attending: Nephrology | Admitting: Nephrology

## 2020-11-03 DIAGNOSIS — N261 Atrophy of kidney (terminal): Secondary | ICD-10-CM | POA: Diagnosis not present

## 2020-11-03 DIAGNOSIS — N183 Chronic kidney disease, stage 3 unspecified: Secondary | ICD-10-CM | POA: Diagnosis not present

## 2020-11-03 DIAGNOSIS — H90A32 Mixed conductive and sensorineural hearing loss, unilateral, left ear with restricted hearing on the contralateral side: Secondary | ICD-10-CM | POA: Diagnosis not present

## 2020-11-03 DIAGNOSIS — H903 Sensorineural hearing loss, bilateral: Secondary | ICD-10-CM | POA: Diagnosis not present

## 2020-11-03 DIAGNOSIS — T8579XA Infection and inflammatory reaction due to other internal prosthetic devices, implants and grafts, initial encounter: Secondary | ICD-10-CM | POA: Diagnosis not present

## 2020-11-03 DIAGNOSIS — N281 Cyst of kidney, acquired: Secondary | ICD-10-CM | POA: Diagnosis not present

## 2020-11-03 DIAGNOSIS — Z951 Presence of aortocoronary bypass graft: Secondary | ICD-10-CM | POA: Diagnosis not present

## 2020-11-03 DIAGNOSIS — N2889 Other specified disorders of kidney and ureter: Secondary | ICD-10-CM | POA: Diagnosis not present

## 2020-11-03 DIAGNOSIS — H90A22 Sensorineural hearing loss, unilateral, left ear, with restricted hearing on the contralateral side: Secondary | ICD-10-CM | POA: Diagnosis not present

## 2020-11-07 ENCOUNTER — Other Ambulatory Visit: Payer: Self-pay | Admitting: Endocrinology

## 2020-11-20 ENCOUNTER — Other Ambulatory Visit: Payer: Self-pay | Admitting: Endocrinology

## 2020-12-27 DIAGNOSIS — N270 Small kidney, unilateral: Secondary | ICD-10-CM | POA: Diagnosis not present

## 2020-12-27 DIAGNOSIS — R351 Nocturia: Secondary | ICD-10-CM | POA: Diagnosis not present

## 2020-12-27 DIAGNOSIS — R3914 Feeling of incomplete bladder emptying: Secondary | ICD-10-CM | POA: Diagnosis not present

## 2020-12-27 DIAGNOSIS — N281 Cyst of kidney, acquired: Secondary | ICD-10-CM | POA: Diagnosis not present

## 2020-12-27 DIAGNOSIS — N401 Enlarged prostate with lower urinary tract symptoms: Secondary | ICD-10-CM | POA: Diagnosis not present

## 2021-01-03 ENCOUNTER — Ambulatory Visit: Payer: Medicare HMO | Admitting: Physical Therapy

## 2021-01-03 ENCOUNTER — Other Ambulatory Visit: Payer: Self-pay

## 2021-01-03 ENCOUNTER — Encounter: Payer: Self-pay | Admitting: Physical Therapy

## 2021-01-03 DIAGNOSIS — G8929 Other chronic pain: Secondary | ICD-10-CM

## 2021-01-03 DIAGNOSIS — M5442 Lumbago with sciatica, left side: Secondary | ICD-10-CM | POA: Diagnosis not present

## 2021-01-03 NOTE — Patient Instructions (Signed)
Access Code: [facility] URL: [email or web address] Date: 01/03/2021 Prepared by: Lyndee Hensen  Exercises Supine Posterior Pelvic Tilt - 2 x daily - 1 sets - 10 reps - 5 hold Single Knee to Chest Stretch - 2 x daily - 3 reps - 30 hold Supine Piriformis Stretch Pulling Heel to Hip - 2 x daily - 3 reps - 30 hold

## 2021-01-07 ENCOUNTER — Encounter: Payer: Self-pay | Admitting: Physical Therapy

## 2021-01-07 NOTE — Therapy (Addendum)
[facility] [address] [place], [place], [address] Phone: [phone]   Fax:  [phone]  Physical Therapy Evaluation  Patient Details  Name: Justin Salinas MRN: [id number] Date of Birth: [birth year] Referring Provider (PT): Earnie Larsson   Encounter Date: 01/03/2021   PT End of Session - 01/07/21 2004     Visit Number 1    Number of Visits 12    Date for PT Re-Evaluation 02/28/21    Authorization Type [employer]    PT Start Time 0930    PT Stop Time 1013    PT Time Calculation (min) 43 min    Activity Tolerance Patient tolerated treatment well    Behavior During Therapy [facility] for tasks assessed/performed             Past Medical History:  Diagnosis Date   Allergy    Anemia    Asthma    Atherosclerosis of abdominal aorta ([place]) 02/26/2020   Seen on x-ray L-spine September 2021   BPH (benign prostatic hyperplasia)    CAD (coronary artery disease)    a. s/p MI 09/1993;  b. known CTO of RCA;  c. 06/2014 Cath: LM short/mod dzs, LAD 95ost, LCX nl, RI nl, RCA 100, EF 55%;  c. 06/2014 s/p CABG x 4: LIMA->LAD, VG->D1, VG->OM1, VG->Acute Marginal.   Carotid stenosis    a. Carotid US 6/38:  RICA 17-71%; LICA 16-57% // Carotid US 1/22: R 1-39; L 40-59; L VA aberrant flow; bilat subclavian stenosis    CKD (chronic kidney disease), stage III (HCC)    Constipation    in the past 2-3 weeks   Diabetes ([place])    AODM   Diastolic dysfunction    a. 05/2015 Echo: EF 60-65%, LVH, Gr 1 DD, mild MR, mod dil RA/LA, mod TR, PASP 42mmHg.   Essential hypertension    GERD (gastroesophageal reflux disease)    History of kidney stones    Hyperlipidemia     Past Surgical History:  Procedure Laterality Date   ANGIOPLASTY  09/22/1993   POBA of RCA (Dr. Marella Chimes)   [place]   back fusion - Dr. Earnie Larsson   CARDIAC CATHETERIZATION  11/28/1998   "silent occlusion" of RCA w/collaterals from left coronary system, prox LAD w/40-50% eccentric  narrowing, 1st diagonal with 70-80% eccentric narrowing, 85% narrowing of prox small OM1 (Dr. Marella Chimes)   CAROTID DOPPLER  07/2011   left subclavian (50-69%); right bulb (0-49%); RICA (normal); left mid-distal CCA (0-49%); left bulb/prox ICA (50-69%); left vertebral with abnormal antegrade flow   CORONARY ARTERY BYPASS GRAFT N/A 05/21/2014   Procedure: CORONARY ARTERY BYPASS GRAFTING (CABG) x  four, using left internal mammary artery and right leg greater saphenous vein harvested endoscopically;  Surgeon: Melrose Nakayama, MD;  Location: [facility];  Service: Open Heart Surgery;  Laterality: N/A;   INTRAOPERATIVE TRANSESOPHAGEAL ECHOCARDIOGRAM N/A 05/21/2014   Procedure: INTRAOPERATIVE TRANSESOPHAGEAL ECHOCARDIOGRAM;  Surgeon: Melrose Nakayama, MD;  Location: [place] [facility];  Service: Open Heart Surgery;  Laterality: N/A;   LEFT HEART CATHETERIZATION WITH CORONARY ANGIOGRAM N/A 05/19/2014   Procedure: LEFT HEART CATHETERIZATION WITH CORONARY ANGIOGRAM;  Surgeon: Blane Ohara, MD;  Location: [facility] CATH LAB;  Service: Cardiovascular;  Laterality: N/A;   LUMBAR LAMINECTOMY/DECOMPRESSION MICRODISCECTOMY Left 09/23/2020   Procedure: Laminectomy and Foraminotomy - left - Lumbar three-Lumbar four;  Surgeon: Earnie Larsson, MD;  Location: [place];  Service: Neurosurgery;  Laterality: Left;   NASAL SINUS SURGERY  2010  NM MYOCAR PERF WALL MOTION  09/19/2009   bruce myoview - mild perfusion defect in basal inferior region (infarct/scar), EF 60%, low risk scan   RENAL DOPPLER  10/2011   SMA w/ 70-99% diameter reduction & high grade stenosis; R&L renals w/narrowing and increased velocities (60-99%), R kidney smaller than L   TRANSTHORACIC ECHOCARDIOGRAM  10/20/2012   EF 55-60%, moderate concentric hypertrophy, ventricular septum thickness increased, calcified MV annulus   VASECTOMY  1963    There were no vitals filed for this visit.    Subjective Assessment - 01/07/21 2002     Subjective Pt states ongoing pain  in back. Had fusion in 2020, and laminectomy 09/2020. States continued pain in low back and into L LE into foot. Has not had PT in the past. States most difficulty and pain with standing and walking also states poor balance. Pt wants to exercise more.    Pertinent History CAD, CABG x4, DM, Back surgery, fusion in 2000, laminectomy in April 2022    Limitations Lifting;Standing;Walking;House hold activities    How long can you stand comfortably? 30 min    How long can you walk comfortably? 30 min    Patient Stated Goals decreased pain.    Currently in Pain? Yes    Pain Score 7     Pain Location Back    Pain Orientation Left    Pain Descriptors / Indicators Aching    Pain Type Acute pain;Chronic pain    Pain Onset More than a month ago    Pain Frequency Intermittent    Aggravating Factors  standing and walking activity    Pain Relieving Factors sitting, rest                [facility] PT Assessment - 01/07/21 0001       Assessment   Medical Diagnosis Back pain    Referring Provider (PT) Earnie Larsson    Onset Date/Surgical Date 09/23/20   [place]   Prior Therapy no      Precautions   Precautions Back      Balance Screen   Has the patient fallen in the past 6 months No      Prior Function   Level of Independence Independent      Cognition   Overall Cognitive Status Within Functional Limits for tasks assessed      ROM / Strength   AROM / PROM / Strength AROM;Strength      AROM   Overall AROM Comments severe limitation for extension, Mild limitation for hip rotation      Strength   Overall Strength Comments Hips: 4/5      Palpation   Palpation comment Stiffness and hypomobility in thoracic and lumbar spine      Special Tests   Other special tests Neg SLR                        Objective measurements completed on examination: See above findings.       [facility] Adult PT Treatment/Exercise - 01/07/21 0001       Lumbar Exercises: Stretches   Active  Hamstring Stretch Limitations add next visit.    Single Knee to Chest Stretch 3 reps;30 seconds    Pelvic Tilt 20 reps    Piriformis Stretch 3 reps;30 seconds    Piriformis Stretch Limitations supine modified fig 4 stretch;  PT Education - 01/07/21 2003     Education Details PT POC, exam findings, HEP, discussion of best activities, exercises for stenosis.    Person(s) Educated Patient    Methods Explanation;Demonstration;Tactile cues;Verbal cues;Handout    Comprehension Verbalized understanding;Returned demonstration;Verbal cues required;Tactile cues required;Need further instruction              PT Short Term Goals - 01/07/21 2006       PT SHORT TERM GOAL #1   Title Pt to be independent with intial HEP    Time 2    Period Weeks    Status New    Target Date 01/17/21               PT Long Term Goals - 01/07/21 2006       PT LONG TERM GOAL #1   Title Pt to be independent with final HEP    Time 8    Period Weeks    Status New    Target Date 02/28/21      PT LONG TERM GOAL #2   Title Pt to report ability for exercise for at least 30 min without back pain greater than 3/10.    Time 8    Period Weeks    Status New    Target Date 02/28/21      PT LONG TERM GOAL #3   Title Pt to demo improved strength of hips and core to be at least 4+/5 to improve stability, and ability for sustaining functional activity.    Time 8    Period Weeks    Status New    Target Date 02/28/21                    Plan - 01/07/21 2028     Clinical Impression Statement Pt presents with primary complaint of increased pain in lumbar spine. Pt with limited ability and tolerance for standing and walking activity, due to increased pain. Pt with mild strength deficits in LEs and core, and will benefit from education of HEP for best ther ex. Pt with stiffness and hypomobile spine, that limits upright posture. Pt with diffiuclty with IADLs and community  activities due to pain and deficits, and will benefit from skilled PT to improve.    Personal Factors and Comorbidities Past/Current Experience;Comorbidity 1;Time since onset of injury/illness/exacerbation    Comorbidities 2 back surgeries,    Examination-Activity Limitations Lift;Stand;Stairs;Squat;Bend    Examination-Participation Restrictions Cleaning;Laundry;Shop;Community Activity;Meal Prep    Stability/Clinical Decision Making Stable/Uncomplicated    Clinical Decision Making Low    Rehab Potential Good    PT Frequency 2x / week    PT Duration 6 weeks    PT Treatment/Interventions ADLs/Self Care Home Management;Cryotherapy;Electrical Stimulation;DME Instruction;Ultrasound;Traction;Moist Heat;Iontophoresis 4mg /ml Dexamethasone;Gait training;Stair training;Functional mobility training;Therapeutic activities;Therapeutic exercise;Balance training;Neuromuscular re-education;Patient/family education;Passive range of motion;Dry needling;Manual techniques;Taping;Spinal Manipulations;Joint Manipulations    PT Home Exercise Plan [id number]    Consulted and Agree with Plan of Care Patient             Patient will benefit from skilled therapeutic intervention in order to improve the following deficits and impairments:  Pain, Improper body mechanics, Increased muscle spasms, Hypomobility, Decreased strength, Decreased mobility, Decreased activity tolerance, Decreased range of motion, Decreased balance, Impaired flexibility  Visit Diagnosis: Chronic bilateral low back pain with left-sided sciatica     Problem List Patient Active Problem List   Diagnosis Date Noted   Lumbar radiculopathy 09/23/2020   Atherosclerosis of abdominal aorta ([place]) 02/26/2020  Skin infection 08/27/2019   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 of right ear 05/09/2019   Status post placement of bone anchored hearing aid (BAHA) 05/09/2019   Asymmetrical hearing loss,  bilateral 04/08/2019   Asymmetric SNHL (sensorineural hearing loss) 04/02/2019   Sensorineural hearing loss (SNHL) of left ear with restricted hearing of right ear 04/02/2019   Iron deficiency anemia 10/22/2017   Uncontrolled type 2 diabetes mellitus with hyperglycemia, without long-term current use of insulin ([place]) 01/03/2017   PVD (posterior vitreous detachment), left eye 02/16/2016   Vitreous syneresis of right eye 67/67/2094   Diastolic dysfunction    Hypertension associated with diabetes ([place])    CKD stage 3 due to type 2 diabetes mellitus ([place])    S/P CABG x 4 05/21/2014   Bilateral dry eyes 01/20/2014   Nuclear cataract, bilateral 12/10/2013   Astigmatism 01/11/2012   Hypermetropia 01/11/2012   Presbyopia 01/11/2012   Diabetic eye exam ([place]) 08/22/2011   Carotid artery disease ([place]) 12/01/2007   Degenerative disc disease at L5-S1 level 12/01/2007   BPH (benign prostatic hyperplasia) 12/01/2007   Dyslipidemia associated with type 2 diabetes mellitus ([place]) 11/28/2007   CAD (coronary artery disease) 11/28/2007   GERD 11/28/2007   Lyndee Hensen, PT, DPT 9:09 PM  01/07/21    [facility] [address] [place], [place], [address] Phone: [phone]   Fax:  [phone]  Name: Justin Salinas MRN: [id number] Date of Birth: [birth year]  Referring diagnosis? Low back pain/ chronic  Eval date: 01/03/21,  8 weeks:  02/28/21 Treatment diagnosis? (if different than referring diagnosis)  What was this (referring dx) caused by? []  Surgery []  Fall [x]  Ongoing issue []  Arthritis []  Other: ____________  Laterality: []  Rt []  Lt [x]  Both  Check all possible CPT codes:      [x]  97110 (Therapeutic Exercise)  []  92507 (SLP Treatment)  [x]  97112 (Neuro Re-ed)   []  92526 (Swallowing Treatment)   [x]  97116 (Gait Training)   []  [id number] (Cognitive Training, 1st 15 minutes) [x]  97140 (Manual Therapy)   []  97130 (Cognitive Training, each add'l  15 minutes)  [x]  97530 (Therapeutic Activities)  []  Other, List CPT Code ____________    [x]  44967 (Self Care)       []  All codes above (97110 - 97535)  []  97012 (Mechanical Traction)  []  97014 (E-stim Unattended)  []  97032 (E-stim manual)  []  97033 (Ionto)  []  97035 (Ultrasound)  []  97760 (Orthotic Fit) [x]  [id number] (Physical Performance Training) []  [id number] (Aquatic Therapy) []  97034 (Contrast Bath) []  [id number] (Paraffin) []  97597 (Wound Care 1st 20 sq cm) []  97598 (Wound Care each add'l 20 sq cm) []  97016 (Vasopneumatic Device) []  [phone] Comptroller) []  [id number] (Prosthetic Training)   PHYSICAL THERAPY DISCHARGE SUMMARY  Visits from Start of Care: 1 Plan: Patient agrees to discharge.  Patient goals were not met. Patient is being discharged due to- not returning since last visit.    Lyndee Hensen, PT, DPT 4:49 PM  03/05/22

## 2021-01-09 ENCOUNTER — Telehealth: Payer: Self-pay

## 2021-01-09 ENCOUNTER — Other Ambulatory Visit: Payer: Self-pay

## 2021-01-09 DIAGNOSIS — Z01812 Encounter for preprocedural laboratory examination: Secondary | ICD-10-CM | POA: Diagnosis not present

## 2021-01-09 DIAGNOSIS — T8579XA Infection and inflammatory reaction due to other internal prosthetic devices, implants and grafts, initial encounter: Secondary | ICD-10-CM | POA: Diagnosis not present

## 2021-01-09 DIAGNOSIS — R7989 Other specified abnormal findings of blood chemistry: Secondary | ICD-10-CM | POA: Diagnosis not present

## 2021-01-09 DIAGNOSIS — H9190 Unspecified hearing loss, unspecified ear: Secondary | ICD-10-CM

## 2021-01-09 DIAGNOSIS — H90A22 Sensorineural hearing loss, unilateral, left ear, with restricted hearing on the contralateral side: Secondary | ICD-10-CM | POA: Diagnosis not present

## 2021-01-09 DIAGNOSIS — Z01810 Encounter for preprocedural cardiovascular examination: Secondary | ICD-10-CM | POA: Diagnosis not present

## 2021-01-09 DIAGNOSIS — R9431 Abnormal electrocardiogram [ECG] [EKG]: Secondary | ICD-10-CM | POA: Diagnosis not present

## 2021-01-09 NOTE — Telephone Encounter (Signed)
CAn we get more info? Im not really sure what is being asked.  Justin Salinas. [name], MD 01/09/2021 9:48 AM

## 2021-01-09 NOTE — Telephone Encounter (Signed)
Patient is needinh Verbal orders to authorize visit  to [facility] for his Pre op visit  Please call patient with any questions this is all the information I could get from patient

## 2021-01-09 NOTE — Progress Notes (Signed)
Amb

## 2021-01-09 NOTE — Telephone Encounter (Signed)
See note

## 2021-01-09 NOTE — Telephone Encounter (Signed)
I think he just needs referral to see ENT - ok to place referral if that is the case.  [name]. [name], MD 01/09/2021 2:05 PM

## 2021-01-09 NOTE — Telephone Encounter (Signed)
I spoke with pt to address concerns below. Pt says that he has a BAHA implant revision already scheduled for 01/18/2021. He will behaving Pre op today 01/09/2021. He says that he needs a referral from our office. I was unclear on why he needed referral, pt says that Dr. [name] is aware of why he needs referral. Please Advise.

## 2021-01-09 NOTE — Telephone Encounter (Signed)
Referral placed.

## 2021-01-13 DIAGNOSIS — N400 Enlarged prostate without lower urinary tract symptoms: Secondary | ICD-10-CM | POA: Diagnosis not present

## 2021-01-13 DIAGNOSIS — I129 Hypertensive chronic kidney disease with stage 1 through stage 4 chronic kidney disease, or unspecified chronic kidney disease: Secondary | ICD-10-CM | POA: Diagnosis not present

## 2021-01-13 DIAGNOSIS — E1122 Type 2 diabetes mellitus with diabetic chronic kidney disease: Secondary | ICD-10-CM | POA: Diagnosis not present

## 2021-01-13 DIAGNOSIS — N189 Chronic kidney disease, unspecified: Secondary | ICD-10-CM | POA: Diagnosis not present

## 2021-01-13 DIAGNOSIS — N183 Chronic kidney disease, stage 3 unspecified: Secondary | ICD-10-CM | POA: Diagnosis not present

## 2021-01-13 DIAGNOSIS — D631 Anemia in chronic kidney disease: Secondary | ICD-10-CM | POA: Diagnosis not present

## 2021-01-13 DIAGNOSIS — N2889 Other specified disorders of kidney and ureter: Secondary | ICD-10-CM | POA: Diagnosis not present

## 2021-01-13 LAB — IRON,TIBC AND FERRITIN PANEL
%SAT: 39
Ferritin: 197
Iron: 76
TIBC: 197
UIBC: 121

## 2021-01-13 LAB — BASIC METABOLIC PANEL
BUN: 39 — AB (ref 4–21)
CO2: 26 — AB (ref 13–22)
Chloride: 107 (ref 99–108)
Creatinine: 1.9 — AB (ref 0.6–1.3)
Glucose: 239
Potassium: 4.3 (ref 3.4–5.3)
Sodium: 142 (ref 137–147)

## 2021-01-13 LAB — COMPREHENSIVE METABOLIC PANEL
Albumin: 4.3 (ref 3.5–5.0)
Calcium: 9.4 (ref 8.7–10.7)
GFR calc non Af Amer: 34

## 2021-01-13 LAB — CBC AND DIFFERENTIAL: Hemoglobin: 12.1 — AB (ref 13.5–17.5)

## 2021-01-18 ENCOUNTER — Encounter: Payer: Self-pay | Admitting: Family Medicine

## 2021-01-18 ENCOUNTER — Encounter: Payer: Medicare HMO | Admitting: Physical Therapy

## 2021-01-18 DIAGNOSIS — Z45320 Encounter for adjustment and management of bone conduction device: Secondary | ICD-10-CM | POA: Diagnosis not present

## 2021-01-18 DIAGNOSIS — H90A32 Mixed conductive and sensorineural hearing loss, unilateral, left ear with restricted hearing on the contralateral side: Secondary | ICD-10-CM | POA: Diagnosis not present

## 2021-01-18 DIAGNOSIS — H9121 Sudden idiopathic hearing loss, right ear: Secondary | ICD-10-CM | POA: Diagnosis not present

## 2021-01-20 ENCOUNTER — Encounter: Payer: Medicare HMO | Admitting: Physical Therapy

## 2021-01-23 ENCOUNTER — Encounter: Payer: Medicare HMO | Admitting: Physical Therapy

## 2021-01-25 ENCOUNTER — Encounter: Payer: Medicare HMO | Admitting: Physical Therapy

## 2021-01-26 DIAGNOSIS — Z87891 Personal history of nicotine dependence: Secondary | ICD-10-CM | POA: Diagnosis not present

## 2021-01-26 DIAGNOSIS — H90A32 Mixed conductive and sensorineural hearing loss, unilateral, left ear with restricted hearing on the contralateral side: Secondary | ICD-10-CM | POA: Diagnosis not present

## 2021-01-26 DIAGNOSIS — Z48810 Encounter for surgical aftercare following surgery on the sense organs: Secondary | ICD-10-CM | POA: Diagnosis not present

## 2021-01-26 DIAGNOSIS — I10 Essential (primary) hypertension: Secondary | ICD-10-CM | POA: Diagnosis not present

## 2021-01-29 IMAGING — MR MR LUMBAR SPINE W/O CM
4 of 5 series · 25 of 48 positions shown · non-contrast
Comparison: Lumbar radiographs 02/26/2020.

CLINICAL DATA: 79-year-old male with low back pain. Left leg
radiculopathy. No known injury. Prior surgery.

EXAM:
MRI LUMBAR SPINE WITHOUT CONTRAST
TECHNIQUE: Multiplanar, multisequence MR imaging of the lumbar spine was
performed. No intravenous contrast was administered.

[Series 3: T2 · sagittal · 4.0mm · 0.81mm/px · 6 of 15 slices shown (1 of 2)]
[im 1/15]
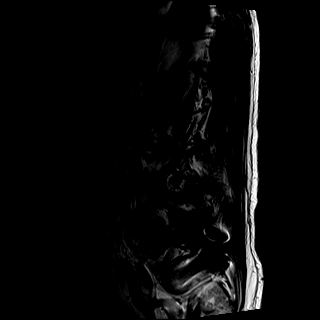
[im 3/15]
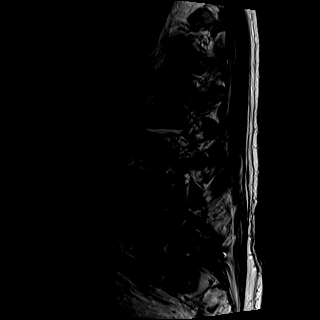
[im 6/15]
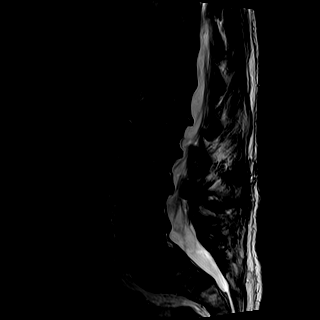
[im 9/15]
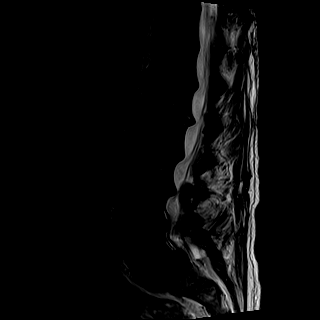
[im 12/15]
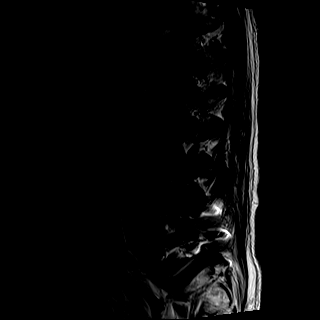
[im 15/15]
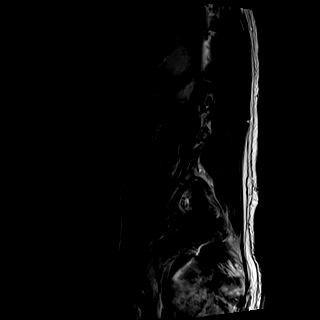

[Series 4: T1 · sagittal · 4.0mm · 0.41mm/px · 6 of 15 slices shown (1 of 2)]
[im 1/15]
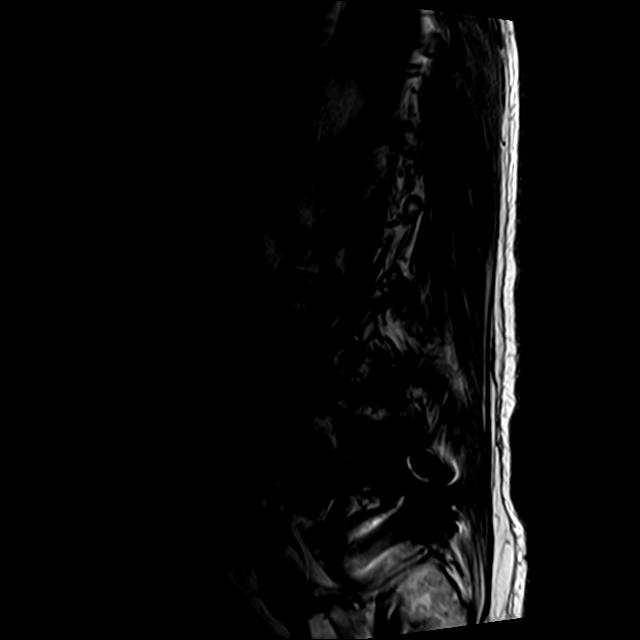
[im 3/15]
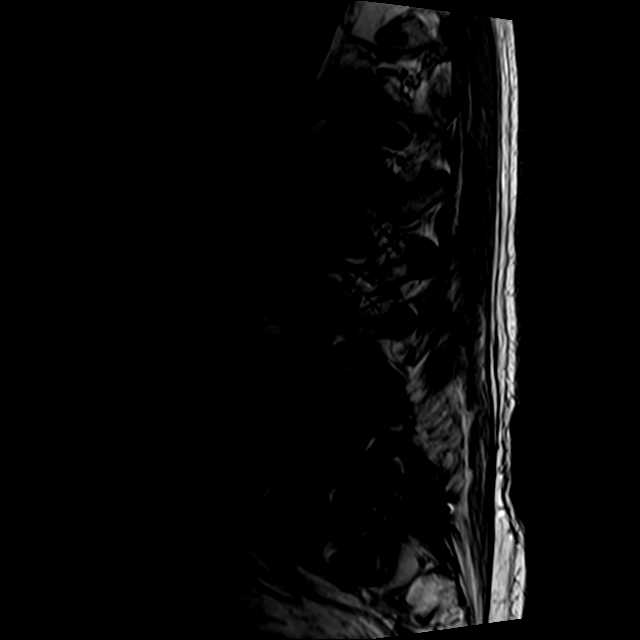
[im 6/15]
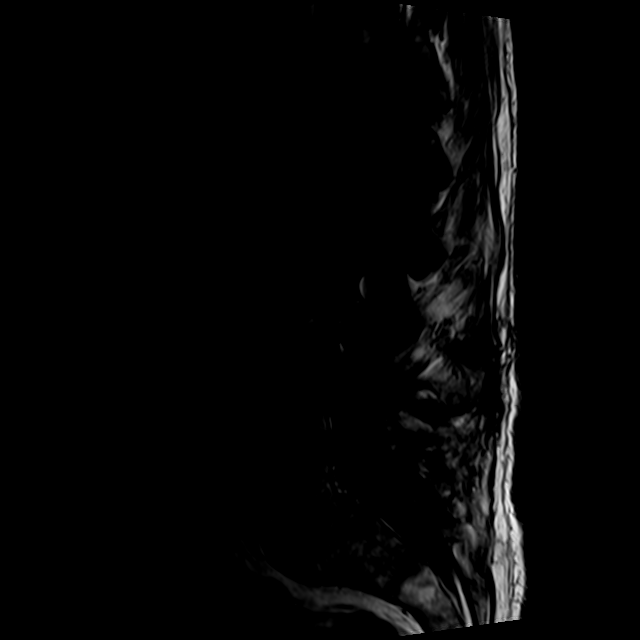
[im 9/15]
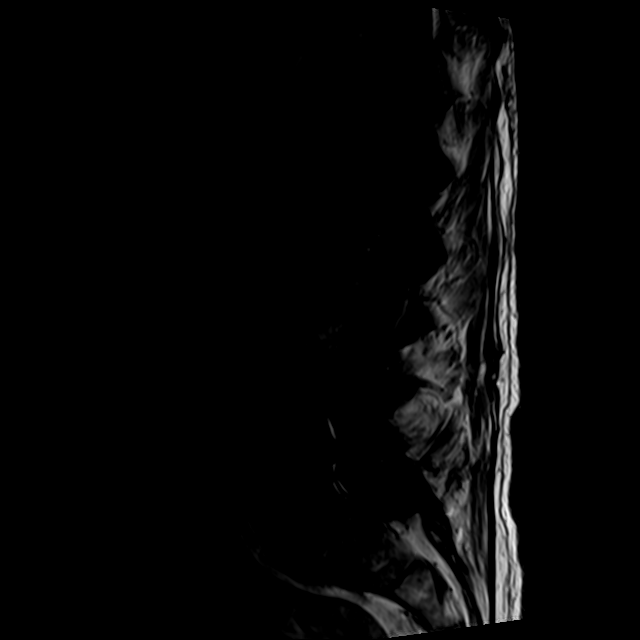
[im 12/15]
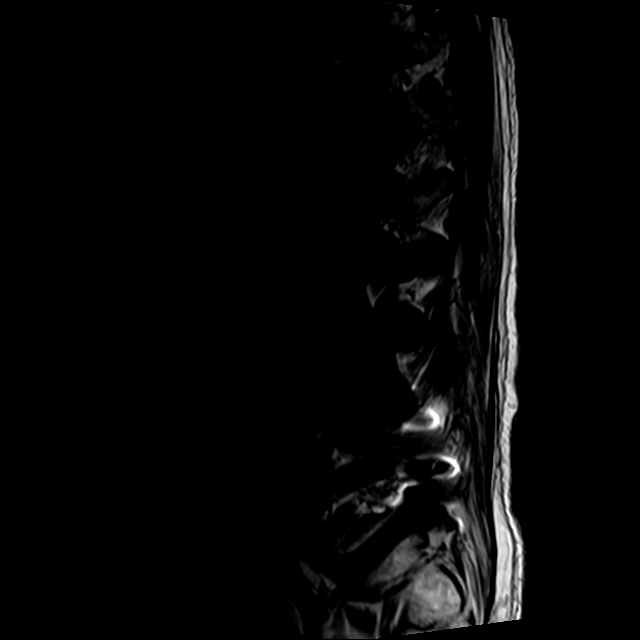
[im 15/15]
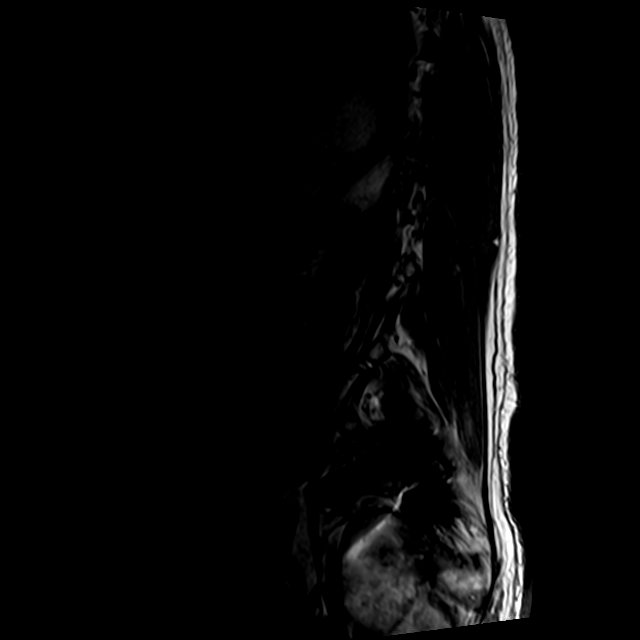

[Series 6: T2 · axial · 4.0mm · 0.78mm/px · z∈[-139,+57]mm · 9 of 37 slices shown (2 of 2)]
[im 1/37]
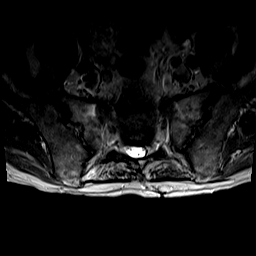
[im 6/37]
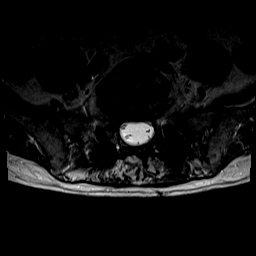
[im 11/37]
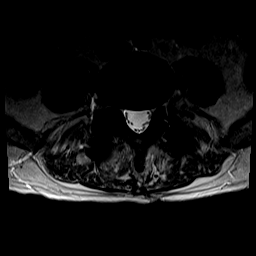
[im 16/37]
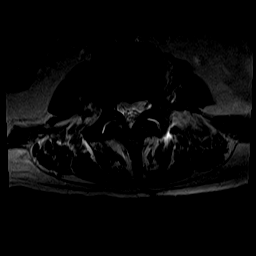
[im 19/37]
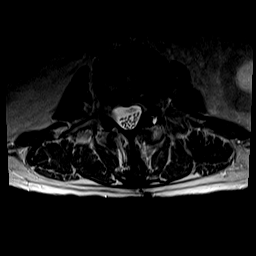
[im 21/37]
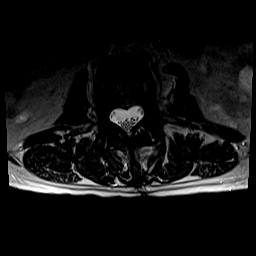
[im 26/37]
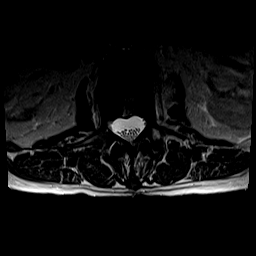
[im 31/37]
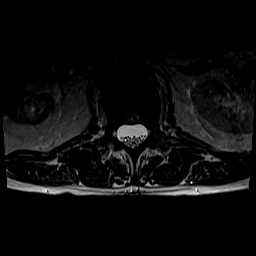
[im 37/37]
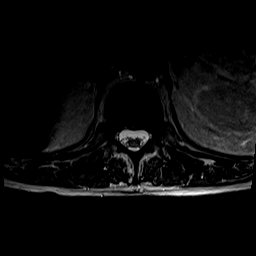

[Series 7: T1 · axial · 4.0mm · 0.39mm/px · z∈[-139,+27]mm · 4 of 37 slices shown (2 of 2)]
[im 1/37]
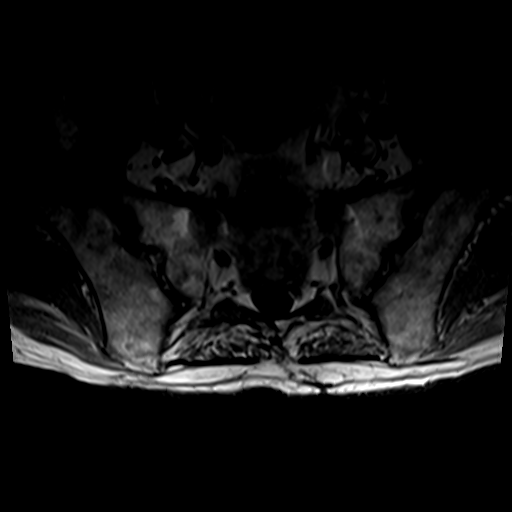
[im 6/37]
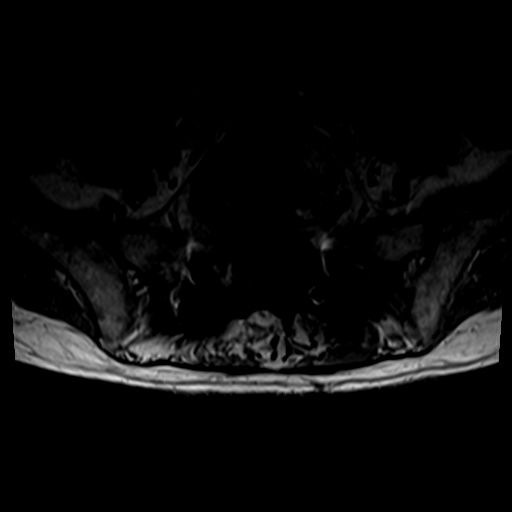
[im 19/37]
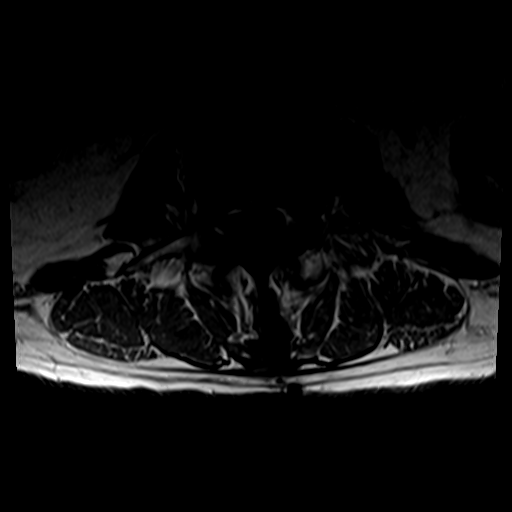
[im 31/37]
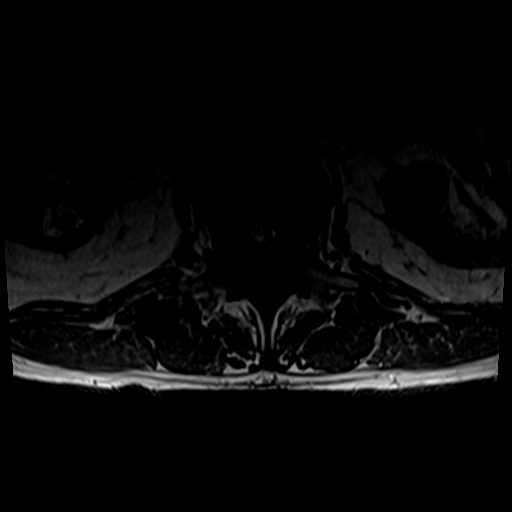

[25 of 48 positions shown; findings below may reference images not displayed]

FINDINGS: Segmentation:  Normal on the comparison radiographs.

Alignment: Straightening of lumbar lordosis with mild retrolisthesis
from L2-L3 through L4-L5. Minor dextroconvex lumbar scoliosis.

Vertebrae: Mild hardware susceptibility artifact at L5 and S1. mild
degenerative appearing endplate edema at L1-L2 anteriorly. No other
acute osseous abnormality identified. Background bone marrow signal
mildly heterogeneous but within normal limits.

Conus medullaris and cauda equina: Conus extends to the T12-L1
level. No lower spinal cord or conus signal abnormality.

Paraspinal and other soft tissues: Atrophied right kidney. Partially
visible benign appearing left renal cysts. Otherwise negative.

Disc levels:

Negative visible lower thoracic levels.

T12-L1:  Mild disc bulge without stenosis.

L1-L2: Severe disc space loss. Bulky circumferential but anterior
and lateral eccentric disc osteophyte complex. Mild left and
moderate right L1 foraminal stenosis without spinal stenosis.

L2-L3: Disc space loss. Circumferential disc osteophyte complex.
Mild to moderate left L2 foraminal stenosis without spinal stenosis.

L3-L4: Disc desiccation, vacuum disc. Bulky circumferential disc
osteophyte complex with mild to moderate posterior element
hypertrophy. Degenerative facet joint fluid greater on the left.
Mild spinal stenosis. Borderline to mild bilateral lateral recess
stenosis (L4 nerve levels). Severe left greater than right L3
foraminal stenosis.

L4-L5: Circumferential disc bulge with broad-based posterior
component. Mild to moderate facet hypertrophy probable previous
ligamentectomy here. No spinal or lateral recess stenosis. Moderate
bilateral L4 foraminal stenosis.

L5-S1: Prior decompression and fusion with solid interbody
arthrodesis.
IMPRESSION: 1. Prior decompression and fusion at L5-S1 with solid interbody
arthrodesis.
2. Relatively mild adjacent segment disease at L4-L5 although
moderate bilateral L4 foraminal stenosis.
3. Bulky degenerative disease at L3-L4 with L3-L4 mild spinal
stenosis but severe left greater than right L3 neural foraminal
stenosis.
4. Mild to moderate L1 and L2 neural foraminal stenosis. Mild
degenerative endplate marrow edema at L1-L2.

## 2021-01-30 ENCOUNTER — Encounter: Payer: Medicare HMO | Admitting: Physical Therapy

## 2021-02-01 ENCOUNTER — Encounter: Payer: Medicare HMO | Admitting: Physical Therapy

## 2021-02-16 DIAGNOSIS — H90A32 Mixed conductive and sensorineural hearing loss, unilateral, left ear with restricted hearing on the contralateral side: Secondary | ICD-10-CM | POA: Diagnosis not present

## 2021-02-16 DIAGNOSIS — E119 Type 2 diabetes mellitus without complications: Secondary | ICD-10-CM | POA: Diagnosis not present

## 2021-02-16 DIAGNOSIS — Z9621 Cochlear implant status: Secondary | ICD-10-CM | POA: Diagnosis not present

## 2021-02-16 DIAGNOSIS — H903 Sensorineural hearing loss, bilateral: Secondary | ICD-10-CM | POA: Diagnosis not present

## 2021-02-22 ENCOUNTER — Telehealth: Payer: Self-pay

## 2021-02-22 ENCOUNTER — Ambulatory Visit (INDEPENDENT_AMBULATORY_CARE_PROVIDER_SITE_OTHER): Payer: Medicare HMO | Admitting: Physician Assistant

## 2021-02-22 ENCOUNTER — Ambulatory Visit (INDEPENDENT_AMBULATORY_CARE_PROVIDER_SITE_OTHER): Payer: Medicare HMO

## 2021-02-22 ENCOUNTER — Encounter: Payer: Self-pay | Admitting: Physician Assistant

## 2021-02-22 ENCOUNTER — Other Ambulatory Visit: Payer: Self-pay | Admitting: Endocrinology

## 2021-02-22 ENCOUNTER — Other Ambulatory Visit: Payer: Self-pay

## 2021-02-22 VITALS — BP 160/62 | HR 71 | Temp 98.0°F | Wt 166.6 lb

## 2021-02-22 DIAGNOSIS — Z23 Encounter for immunization: Secondary | ICD-10-CM | POA: Diagnosis not present

## 2021-02-22 DIAGNOSIS — S51011A Laceration without foreign body of right elbow, initial encounter: Secondary | ICD-10-CM

## 2021-02-22 NOTE — Progress Notes (Signed)
Justin Salinas is a 80 y.o. male here for a new problem.  History of Present Illness:   Chief Complaint  Patient presents with   Wound Infection    [place] on a piece of metal yesterday causing a laceration on right elbow    HPI  Skin tear Patient experienced a fall on his elbow yesterday.  He caught his elbow on a piece of metal.  Last tetanus shot was in 2018.  He cleaned the area with rubbing alcohol and bandaged do this at home.  He denies any: Fever, chills, redness, excessive bleeding, purulent drainage, odor.  He is a well-controlled diabetic, current A1c is 6.6%.  He is on low-dose aspirin daily but no other type of anticoagulation.  Past Medical History:  Diagnosis Date   Allergy    Anemia    Asthma    Atherosclerosis of abdominal aorta ([place]) 02/26/2020   Seen on x-ray L-spine September 2021   BPH (benign prostatic hyperplasia)    CAD (coronary artery disease)    a. s/p MI 09/1993;  b. known CTO of RCA;  c. 06/2014 Cath: LM short/mod dzs, LAD 95ost, LCX nl, RI nl, RCA 100, EF 55%;  c. 06/2014 s/p CABG x 4: LIMA->LAD, VG->D1, VG->OM1, VG->Acute Marginal.   Carotid stenosis    a. Carotid US 6/37:  RICA 85-88%; LICA 50-27% // Carotid US 1/22: R 1-39; L 40-59; L VA aberrant flow; bilat subclavian stenosis    CKD (chronic kidney disease), stage III (HCC)    Constipation    in the past 2-3 weeks   Diabetes ([place])    AODM   Diastolic dysfunction    a. 05/2015 Echo: EF 60-65%, LVH, Gr 1 DD, mild MR, mod dil RA/LA, mod TR, PASP 46mHg.   Essential hypertension    GERD (gastroesophageal reflux disease)    History of kidney stones    Hyperlipidemia      Social History   Tobacco Use   Smoking status: Former    Types: Cigarettes    Quit date: 06/04/1972    Years since quitting: 48.7   Smokeless tobacco: Current    Types: Chew   Tobacco comments:    quit about 40 years  Vaping Use   Vaping Use: Never used  Substance Use Topics   Alcohol use: Yes    Alcohol/week: 3.0  standard drinks    Types: 3 Standard drinks or equivalent per week    Comment: a glass of red wine 3 or 4 days a week.   Drug use: No    Past Surgical History:  Procedure Laterality Date   ANGIOPLASTY  09/22/1993   POBA of RCA (Dr. RMarella Chimes   [place]  back fusion - Dr. HEarnie Larsson  CARDIAC CATHETERIZATION  11/28/1998   "silent occlusion" of RCA w/collaterals from left coronary system, prox LAD w/40-50% eccentric narrowing, 1st diagonal with 70-80% eccentric narrowing, 85% narrowing of prox small OM1 (Dr. RMarella Chimes   CAROTID DOPPLER  07/2011   left subclavian (50-69%); right bulb (0-49%); RICA (normal); left mid-distal CCA (0-49%); left bulb/prox ICA (50-69%); left vertebral with abnormal antegrade flow   CORONARY ARTERY BYPASS GRAFT N/A 05/21/2014   Procedure: CORONARY ARTERY BYPASS GRAFTING (CABG) x  four, using left internal mammary artery and right leg greater saphenous vein harvested endoscopically;  Surgeon: SMelrose Nakayama MD;  [place]  Service: Open Heart Surgery;  Laterality: N/A;   INTRAOPERATIVE TRANSESOPHAGEAL ECHOCARDIOGRAM N/A 05/21/2014  Procedure: INTRAOPERATIVE TRANSESOPHAGEAL ECHOCARDIOGRAM;  Surgeon: Melrose Nakayama, MD;  Location: [facility];  Service: Open Heart Surgery;  Laterality: N/A;   LEFT HEART CATHETERIZATION WITH CORONARY ANGIOGRAM N/A 05/19/2014   Procedure: LEFT HEART CATHETERIZATION WITH CORONARY ANGIOGRAM;  Surgeon: Blane Ohara, MD;  Location: [facility] CATH LAB;  Service: Cardiovascular;  Laterality: N/A;   LUMBAR LAMINECTOMY/DECOMPRESSION MICRODISCECTOMY Left 09/23/2020   Procedure: Laminectomy and Foraminotomy - left - Lumbar three-Lumbar four;  Surgeon: Earnie Larsson, MD;  Location: [place];  Service: Neurosurgery;  Laterality: Left;   NASAL SINUS SURGERY  2010   NM MYOCAR PERF WALL MOTION  09/19/2009   bruce myoview - mild perfusion defect in basal inferior region (infarct/scar), EF 60%, low risk scan   RENAL DOPPLER  10/2011    SMA w/ 70-99% diameter reduction & high grade stenosis; R&L renals w/narrowing and increased velocities (60-99%), R kidney smaller than L   TRANSTHORACIC ECHOCARDIOGRAM  10/20/2012   EF 55-60%, moderate concentric hypertrophy, ventricular septum thickness increased, calcified MV annulus   VASECTOMY  1963    Family History  Problem Relation Age of Onset   Uterine cancer Mother    Stroke Father    Diabetes Sister    Colon polyps Brother    Heart disease Brother    Colon cancer Neg Hx    Esophageal cancer Neg Hx    Rectal cancer Neg Hx    Stomach cancer Neg Hx     Allergies  Allergen Reactions   Exenatide Other (See Comments)   Linagliptin Other (See Comments)   Lasix [Furosemide] Other (See Comments)    Constipation    Current Medications:   Current Outpatient Medications:    ACCU-CHEK GUIDE test strip, TEST  BLOOD  SUGARS TWICE DAILY, Disp: 200 strip, Rfl: 3   alfuzosin (UROXATRAL) 10 MG 24 hr tablet, Take 10 mg by mouth daily with breakfast., Disp: , Rfl:    amLODipine (NORVASC) 10 MG tablet, Take 1 tablet (10 mg total) by mouth daily., Disp: 90 tablet, Rfl: 3   Ascorbic Acid (VITAMIN C) 1000 MG tablet, Take 1,000 mg by mouth daily., Disp: , Rfl:    aspirin 81 MG tablet, Take 81 mg by mouth daily., Disp: , Rfl:    cyclobenzaprine (FLEXERIL) 10 MG tablet, Take 1 tablet (10 mg total) by mouth 3 (three) times daily as needed for muscle spasms., Disp: 30 tablet, Rfl: 0   doxycycline (VIBRA-TABS) 100 MG tablet, Take by mouth., Disp: , Rfl:    DROPLET PEN NEEDLES 31G X 5 MM MISC, USE AS DIRECTED TWICE DAILY, Disp: 200 each, Rfl: 2   furosemide (LASIX) 40 MG tablet, Take 40 mg by mouth 2 (two) times daily., Disp: , Rfl:    gabapentin (NEURONTIN) 300 MG capsule, Take 1 capsule (300 mg total) by mouth 3 (three) times daily as needed (nerve pain)., Disp: 270 capsule, Rfl: 1   glucose blood (ACCU-CHEK GUIDE) test strip, Use to check blood sugars 2 times daily, Disp: 200 each, Rfl: 1    HYDROcodone-acetaminophen (NORCO/VICODIN) 5-325 MG tablet, Take 1 tablet by mouth every 4 (four) hours as needed for moderate pain ((score 4 to 6))., Disp: 30 tablet, Rfl: 0   insulin aspart protamine - aspart (NOVOLOG MIX 70/30 FLEXPEN) (70-30) 100 UNIT/ML FlexPen, INJECT 10 UNITS UNDER THE SKIN BEFORE BREAKFAST AND 8 UNITS AT DINNER (Patient taking differently: Inject 8 Units into the skin 2 (two) times daily with a meal.), Disp: 30 mL, Rfl: 1   Lancets Misc. (  ACCU-CHEK SOFTCLIX LANCET DEV) KIT, , Disp: , Rfl:    losartan (COZAAR) 100 MG tablet, Take 1 tablet (100 mg total) by mouth daily. (Patient taking differently: Take 50 mg by mouth daily.), Disp: 90 tablet, Rfl: 3   Multiple Vitamins-Minerals (CENTRUM SILVER ADULT 50+ PO), Take by mouth., Disp: , Rfl:    Omega-3 1400 MG CAPS, Take 1,400 mg by mouth daily., Disp: , Rfl:    repaglinide (PRANDIN) 1 MG tablet, TAKE ONE TABLET BEFORE BREAKFAST AND LUNCH, Disp: 180 tablet, Rfl: 2   simvastatin (ZOCOR) 40 MG tablet, Take 1 tablet (40 mg total) by mouth daily at 6 PM., Disp: 90 tablet, Rfl: 3   TRUEplus Lancets 28G MISC, TEST BLOOD SUGAR TWICE DAILY, Disp: 200 each, Rfl: 5   hydrALAZINE (APRESOLINE) 50 MG tablet, Take 1 tablet (50 mg total) by mouth 3 (three) times daily., Disp: 270 tablet, Rfl: 3   Review of Systems:   ROS Negative unless otherwise specified per HPI.  Vitals:   Vitals:   02/22/21 1105  BP: (!) 160/62  Pulse: 71  Temp: 98 F (36.7 C)  TempSrc: Temporal  SpO2: 97%  Weight: 166 lb 9.6 oz (75.6 kg)     Body mass index is 23.9 kg/m.  Physical Exam:   Physical Exam Vitals and nursing note reviewed.  Constitutional:      Appearance: He is well-developed.  HENT:     Head: Normocephalic.  Eyes:     Conjunctiva/sclera: Conjunctivae normal.     Pupils: Pupils are equal, round, and reactive to light.  Pulmonary:     Effort: Pulmonary effort is normal.  Musculoskeletal:        General: Normal range of motion.      Cervical back: Normal range of motion.  Skin:    General: Skin is warm and dry.     Comments: Right elbow with approximately 4 skin tears ranging in sizes from 1 cm to approximately 3 cm, no purulent drainage or obvious swelling noted  Neurological:     Mental Status: He is alert and oriented to person, place, and time.  Psychiatric:        Behavior: Behavior normal.        Thought Content: Thought content normal.        Judgment: Judgment normal.   Informed consent:  Discussed the risks (permanent scarring, light or dark discoloration, infection, pain, bleeding, bruising, redness, blister formation, and recurrence of the lesion) and the benefits of the procedure, as well as the alternatives.  Informed consent was obtained. Anesthesia: None Type of Repair: simple repair Reason for Type of Repair: enhance both functionality and cosmetic results The area was prepared and draped in a standard fashion. The wound was cleansed with normal saline. Meticulous hemostasis was obtained with pressure. Torn skin was reapplied to area and Dermabond glue was used to apply Steri-Strips to cover skin tears Approximately 9 Steri-Strips used on the right elbow to close skin tear Dermabond then dried and nonstick bandage, Coban was applied The patient tolerated the procedure well.   Assessment and Plan:   Skin tear of right elbow without complication, initial encounter Tolerated procedure well Tetanus is up-to-date Patient was instructed as follows: Keep the strips very dry -- if they get wet the glue and strips will come off Change the bandage daily Follow-up with me on Monday, sooner if you develop any worsening pain, redness, swelling, fever or chills  Inda Coke, PA-C

## 2021-02-22 NOTE — Telephone Encounter (Signed)
Patient Name: SPIKE DESILETS Gender: Male DOB: 1940/09/03 Age: 80 Y 95 M 19 D Return Phone Number: [phone] (Primary) Address: City/ State/ Zip: [place]  [address] Client [facility] [place] Client Site [facility] [place] Night Physician [name]- MD Contact Type Call Who Is Calling Patient / Member / Family / Caregiver Call Type Triage / Clinical Relationship To Patient Self Return Phone Number [phone] (Primary) Chief Complaint Arm Injury Reason for Call Symptomatic / Request for Health Information Initial Comment Caller skinned his arm yesterday. Caller wants to make an appointment Translation No Nurse Assessment Nurse: Markus Daft, RN, Sherre Poot Date/Time Eilene Ghazi Time): 02/22/2021 8:13:47 AM Confirm and document reason for call. If symptomatic, describe symptoms. ---Caller states skinned his right upper arm yesterday when he fell when he hit a rusty metal object. Area affected 3 inches long x 1 - 2 inches wide. He was able to get bleeding to stop last night. Does the patient have any new or worsening symptoms? ---Yes Will a triage be completed? ---Yes Related visit to physician within the last 2 weeks? ---No Does the PT have any chronic conditions? (i.e. diabetes, asthma, this includes High risk factors for pregnancy, etc.) ---Yes List chronic conditions. ---Diabetes Is this a behavioral health or substance abuse call? ---No Guidelines Guideline Title Affirmed Question Affirmed Notes Nurse Date/Time (Eastern Time) Skin Tear [1] Dirt in the skin tear AND [2] not removed with 15 minutes of flushing with water Markus Daft, RN, Sherre Poot 02/22/2021 8:16:32 AM PLEASE NOTE: All timestamps contained within this report are represented as [place] Standard Time. CONFIDENTIALTY NOTICE: This fax transmission is intended only for the addressee. It contains information that is legally privileged, confidential  or otherwise protected from use or disclosure. If you are not the intended recipient, you are strictly prohibited from reviewing, disclosing, copying using or disseminating any of this information or taking any action in reliance on or regarding this information. If you have received this fax in error, please notify us immediately by telephone so that we can arrange for its return to [place]. Phone: [phone], Toll-Free: [phone], Fax: [phone] Page: 2 of 2 Call Id: [id number] [place]. Time Eilene Ghazi Time) Disposition Final User 02/22/2021 8:21:39 AM Called On-Call Provider Markus Daft, RN, Sherre Poot Reason: Transferred to office for appt d/t long hold time, RN dropped off. 02/22/2021 8:18:06 AM Go to ED Now (or PCP triage) Yes Markus Daft, RN, Kenton Kingfisher Disagree/Comply Comply Caller Understands Yes PreDisposition Go to Urgent Care/Walk-In Clinic Care Advice Given Per Guideline GO TO ED NOW (OR PCP TRIAGE): * IF NO PCP (PRIMARY CARE PROVIDER) SECOND-LEVEL TRIAGE: You need to be seen within the next hour. Go to the [facility] at _____________ [place] as soon as you can. ALTERNATE DISPOSITION - URGENT CARE CENTER: * An Urgent [facility] can usually manage this problem, IF one is available in the caller's area. CARE ADVICE given per Skin Tear (Adult) guideline. Referrals REFERRED TO PCP OFFICE

## 2021-02-22 NOTE — Telephone Encounter (Signed)
Pt was seen today.

## 2021-02-22 NOTE — Patient Instructions (Addendum)
It was great to see you!  Keep the strips very dry -- if they get wet the glue and strips will come off  Change the bandage daily  Follow-up with me on Monday, sooner if you develop any worsening pain, redness, swelling, fever or chills  Take care,  Inda Coke PA-C

## 2021-02-27 ENCOUNTER — Other Ambulatory Visit: Payer: Self-pay

## 2021-02-27 ENCOUNTER — Encounter: Payer: Self-pay | Admitting: Physician Assistant

## 2021-02-27 ENCOUNTER — Ambulatory Visit (INDEPENDENT_AMBULATORY_CARE_PROVIDER_SITE_OTHER): Payer: Medicare HMO | Admitting: Physician Assistant

## 2021-02-27 DIAGNOSIS — S51011D Laceration without foreign body of right elbow, subsequent encounter: Secondary | ICD-10-CM

## 2021-02-27 NOTE — Progress Notes (Signed)
Justin Salinas is a 80 y.o. male here for a follow up of a pre-existing problem.  History of Present Illness:   Chief Complaint  Patient presents with   Wound Check     HPI  Skin tear follow-up Patient was seen by me on 02/22/2021 for repair of skin tear to right elbow.  Patient reports that since he left the office he has been doing very well.  He has been changing his bandage daily as instructed.  He denies: Fever, chills, drainage from area, tenderness, worsening pain.  He states that the Steri-Strips started to fall off over the weekend, and he removed them this morning.  He has full range of motion of his arm.  Past Medical History:  Diagnosis Date   Allergy    Anemia    Asthma    Atherosclerosis of abdominal aorta ([place]) 02/26/2020   Seen on x-ray L-spine September 2021   BPH (benign prostatic hyperplasia)    CAD (coronary artery disease)    a. s/p MI 09/1993;  b. known CTO of RCA;  c. 06/2014 Cath: LM short/mod dzs, LAD 95ost, LCX nl, RI nl, RCA 100, EF 55%;  c. 06/2014 s/p CABG x 4: LIMA->LAD, VG->D1, VG->OM1, VG->Acute Marginal.   Carotid stenosis    a. Carotid US 0/03:  RICA 70-48%; LICA 88-91% // Carotid US 1/22: R 1-39; L 40-59; L VA aberrant flow; bilat subclavian stenosis    CKD (chronic kidney disease), stage III (HCC)    Constipation    in the past 2-3 weeks   Diabetes ([place])    AODM   Diastolic dysfunction    a. 05/2015 Echo: EF 60-65%, LVH, Gr 1 DD, mild MR, mod dil RA/LA, mod TR, PASP 58mmHg.   Essential hypertension    GERD (gastroesophageal reflux disease)    History of kidney stones    Hyperlipidemia      Social History   Tobacco Use   Smoking status: Former    Types: Cigarettes    Quit date: 06/04/1972    Years since quitting: 48.7   Smokeless tobacco: Current    Types: Chew   Tobacco comments:    quit about 40 years  Vaping Use   Vaping Use: Never used  Substance Use Topics   Alcohol use: Yes    Alcohol/week: 3.0 standard drinks    Types: 3  Standard drinks or equivalent per week    Comment: a glass of red wine 3 or 4 days a week.   Drug use: No    Past Surgical History:  Procedure Laterality Date   ANGIOPLASTY  09/22/1993   POBA of RCA (Dr. Marella Chimes)   [place]   back fusion - Dr. Earnie Larsson   CARDIAC CATHETERIZATION  11/28/1998   "silent occlusion" of RCA w/collaterals from left coronary system, prox LAD w/40-50% eccentric narrowing, 1st diagonal with 70-80% eccentric narrowing, 85% narrowing of prox small OM1 (Dr. Marella Chimes)   CAROTID DOPPLER  07/2011   left subclavian (50-69%); right bulb (0-49%); RICA (normal); left mid-distal CCA (0-49%); left bulb/prox ICA (50-69%); left vertebral with abnormal antegrade flow   CORONARY ARTERY BYPASS GRAFT N/A 05/21/2014   Procedure: CORONARY ARTERY BYPASS GRAFTING (CABG) x  four, using left internal mammary artery and right leg greater saphenous vein harvested endoscopically;  Surgeon: Melrose Nakayama, MD;  Location: [place];  Service: Open Heart Surgery;  Laterality: N/A;   INTRAOPERATIVE TRANSESOPHAGEAL ECHOCARDIOGRAM N/A 05/21/2014   Procedure: INTRAOPERATIVE TRANSESOPHAGEAL ECHOCARDIOGRAM;  Surgeon: Melrose Nakayama, MD;  Location: [facility];  Service: Open Heart Surgery;  Laterality: N/A;   LEFT HEART CATHETERIZATION WITH CORONARY ANGIOGRAM N/A 05/19/2014   Procedure: LEFT HEART CATHETERIZATION WITH CORONARY ANGIOGRAM;  Surgeon: Blane Ohara, MD;  Location: [facility] CATH LAB;  Service: Cardiovascular;  Laterality: N/A;   LUMBAR LAMINECTOMY/DECOMPRESSION MICRODISCECTOMY Left 09/23/2020   Procedure: Laminectomy and Foraminotomy - left - Lumbar three-Lumbar four;  Surgeon: Earnie Larsson, MD;  Location: [place] [facility];  Service: Neurosurgery;  Laterality: Left;   NASAL SINUS SURGERY  2010   NM MYOCAR PERF WALL MOTION  09/19/2009   bruce myoview - mild perfusion defect in basal inferior region (infarct/scar), EF 60%, low risk scan   RENAL DOPPLER  10/2011   SMA w/ 70-99% diameter  reduction & high grade stenosis; R&L renals w/narrowing and increased velocities (60-99%), R kidney smaller than L   TRANSTHORACIC ECHOCARDIOGRAM  10/20/2012   EF 55-60%, moderate concentric hypertrophy, ventricular septum thickness increased, calcified MV annulus   VASECTOMY  1963    Family History  Problem Relation Age of Onset   Uterine cancer Mother    Stroke Father    Diabetes Sister    Colon polyps Brother    Heart disease Brother    Colon cancer Neg Hx    Esophageal cancer Neg Hx    Rectal cancer Neg Hx    Stomach cancer Neg Hx     Allergies  Allergen Reactions   Exenatide Other (See Comments)   Linagliptin Other (See Comments)   Lasix [Furosemide] Other (See Comments)    Constipation    Current Medications:   Current Outpatient Medications:    ACCU-CHEK GUIDE test strip, TEST  BLOOD  SUGARS TWICE DAILY, Disp: 200 strip, Rfl: 1   alfuzosin (UROXATRAL) 10 MG 24 hr tablet, Take 10 mg by mouth daily with breakfast., Disp: , Rfl:    amLODipine (NORVASC) 10 MG tablet, Take 1 tablet (10 mg total) by mouth daily., Disp: 90 tablet, Rfl: 3   Ascorbic Acid (VITAMIN C) 1000 MG tablet, Take 1,000 mg by mouth daily., Disp: , Rfl:    aspirin 81 MG tablet, Take 81 mg by mouth daily., Disp: , Rfl:    cyclobenzaprine (FLEXERIL) 10 MG tablet, Take 1 tablet (10 mg total) by mouth 3 (three) times daily as needed for muscle spasms., Disp: 30 tablet, Rfl: 0   doxycycline (VIBRA-TABS) 100 MG tablet, Take by mouth., Disp: , Rfl:    DROPLET PEN NEEDLES 31G X 5 MM MISC, USE AS DIRECTED TWICE DAILY, Disp: 200 each, Rfl: 2   furosemide (LASIX) 40 MG tablet, Take 40 mg by mouth 2 (two) times daily., Disp: , Rfl:    gabapentin (NEURONTIN) 300 MG capsule, Take 1 capsule (300 mg total) by mouth 3 (three) times daily as needed (nerve pain)., Disp: 270 capsule, Rfl: 1   glucose blood (ACCU-CHEK GUIDE) test strip, Use to check blood sugars 2 times daily, Disp: 200 each, Rfl: 1   hydrALAZINE (APRESOLINE)  50 MG tablet, Take 1 tablet (50 mg total) by mouth 3 (three) times daily., Disp: 270 tablet, Rfl: 3   HYDROcodone-acetaminophen (NORCO/VICODIN) 5-325 MG tablet, Take 1 tablet by mouth every 4 (four) hours as needed for moderate pain ((score 4 to 6))., Disp: 30 tablet, Rfl: 0   insulin aspart protamine - aspart (NOVOLOG MIX 70/30 FLEXPEN) (70-30) 100 UNIT/ML FlexPen, INJECT 8 UNITS UNDER THE SKIN BEFORE BREAKFAST AND 8 UNITS AT DINNER. DISCARD PEN 14 DAYS AFTER OPENING,  Disp: 30 mL, Rfl: 0   Lancets Misc. (ACCU-CHEK SOFTCLIX LANCET DEV) KIT, , Disp: , Rfl:    losartan (COZAAR) 100 MG tablet, Take 1 tablet (100 mg total) by mouth daily. (Patient taking differently: Take 50 mg by mouth daily.), Disp: 90 tablet, Rfl: 3   Multiple Vitamins-Minerals (CENTRUM SILVER ADULT 50+ PO), Take by mouth., Disp: , Rfl:    Omega-3 1400 MG CAPS, Take 1,400 mg by mouth daily., Disp: , Rfl:    repaglinide (PRANDIN) 1 MG tablet, TAKE ONE TABLET BEFORE BREAKFAST AND LUNCH, Disp: 180 tablet, Rfl: 2   simvastatin (ZOCOR) 40 MG tablet, Take 1 tablet (40 mg total) by mouth daily at 6 PM., Disp: 90 tablet, Rfl: 3   TRUEplus Lancets 28G MISC, TEST BLOOD SUGAR TWICE DAILY, Disp: 200 each, Rfl: 5   Review of Systems:   ROS Negative unless otherwise specified per HPI.  Vitals:   There were no vitals filed for this visit.   There is no height or weight on file to calculate BMI.  Physical Exam:   Physical Exam Vitals and nursing note reviewed.  Constitutional:      Appearance: He is well-developed.  HENT:     Head: Normocephalic.  Eyes:     Conjunctiva/sclera: Conjunctivae normal.     Pupils: Pupils are equal, round, and reactive to light.  Pulmonary:     Effort: Pulmonary effort is normal.  Musculoskeletal:        General: Normal range of motion.     Cervical back: Normal range of motion.  Skin:    General: Skin is warm and dry.     Comments: Right lateral elbow with well-healing skin tears with scabbing.  No  discharge, erythema or swelling appreciated.  Neurological:     Mental Status: He is alert and oriented to person, place, and time.  Psychiatric:        Behavior: Behavior normal.        Thought Content: Thought content normal.        Judgment: Judgment normal.    Assessment and Plan:   Skin tear of right elbow without complication, subsequent encounter Healing well No need for further intervention at this time Continue to keep clean and dry  Inda Coke, PA-C

## 2021-02-28 ENCOUNTER — Encounter: Payer: Self-pay | Admitting: Endocrinology

## 2021-02-28 ENCOUNTER — Ambulatory Visit (INDEPENDENT_AMBULATORY_CARE_PROVIDER_SITE_OTHER): Payer: Medicare HMO | Admitting: Endocrinology

## 2021-02-28 VITALS — BP 140/62 | HR 74 | Ht 70.0 in | Wt 174.2 lb

## 2021-02-28 DIAGNOSIS — R600 Localized edema: Secondary | ICD-10-CM

## 2021-02-28 DIAGNOSIS — E1142 Type 2 diabetes mellitus with diabetic polyneuropathy: Secondary | ICD-10-CM | POA: Diagnosis not present

## 2021-02-28 DIAGNOSIS — Z794 Long term (current) use of insulin: Secondary | ICD-10-CM | POA: Diagnosis not present

## 2021-02-28 DIAGNOSIS — E1165 Type 2 diabetes mellitus with hyperglycemia: Secondary | ICD-10-CM

## 2021-02-28 LAB — POCT GLYCOSYLATED HEMOGLOBIN (HGB A1C): Hemoglobin A1C: 6.2 % — AB (ref 4.0–5.6)

## 2021-02-28 NOTE — Patient Instructions (Signed)
REDUCE INSULIN at supper to 6 units  MUST check sugar at bedtime every 2 days  If eating any starchy or greasy food at dinner take 1mg  Repaganide at supper too

## 2021-02-28 NOTE — Progress Notes (Signed)
Patient ID: Justin Salinas, male   DOB: 1940/10/23, 80 y.o.   MRN: [id number]           Reason for Appointment: Follow-up for Type 2 Diabetes    History of Present Illness:          Date of diagnosis of type 2 diabetes mellitus:2000        Background history:   His diabetes had been mild initially and treated with metformin He had subsequently other medications added including Amaryl and Januvia No details are available from PCP office, [name] in 2015 was 7.6  Because of his poor control was started on insulin on 01/03/17. He has been on Actos since 06/2017+ He had a baseline A1c A1c 9.7 on his initial consultation in 01/2017  Recent history:   Insulin regimen: NovoLog mix, 8 units at breakfast and 8 at supper Prandin 1 mg at breakfast and lunch daily  A1c is lower than expected at 6.2 compared to 5.9  Last fructosamine was 340, usually over 320  Current management, blood sugar patterns and problems identified:  He has significant variability in his blood sugars at dinnertime and not clear why As before he is forgetting to check his readings after meals  He also has had occasional low blood sugars although not recently  He now is taking 8 units of insulin before breakfast and dinnertime  However he said that if his blood sugars are high at dinnertime he will take 10 units  Does not think he has symptoms of low blood sugars overnight  May possibly have low sugars but dinnertime when he is late for eating lunch  He has tried to go to the gym lately a few days a week doing various kinds of activities  Lab glucose Was 229 Randomly He states he is taking his Prandin at breakfast and lunch Appears to have gained weight but may be related to edema   Side effects from medications have been: None  Compliance with the medical regimen: Fair  Glucose monitoring:  done 2 times a day         Glucometer:  Accu-Chek guide me .      Blood Glucose readings by download of  monitor:  Blood sugar patterns: High variability in the afternoon but blood sugars checked around 4-5 PM, morning sugars are mostly around the same  PRE-MEAL Fasting Lunch Dinner Bedtime Overall  Glucose range:     52-280  Mean/median: 105  148  126   POST-MEAL PC Breakfast PC Lunch PC Dinner  Glucose range: 182    Mean/median:      Previously:  PRE-MEAL Fasting Lunch Dinner Bedtime Overall  Glucose range: 89-142  78-231    Mean/median: 110  140  123      Self-care: The diet that the patient has been following is: tries to limit sweets, fried food       Typical meal intake: Breakfast is eggs,  Oatmeal/ meat, toast.  Lunch: Sandwich or.Crackers with or without peanut butter usually                Dietician visit, most recent: 03/2017   Weight history:   Wt Readings from Last 3 Encounters:  02/28/21 174 lb 3.2 oz (79 kg)  02/22/21 166 lb 9.6 oz (75.6 kg)  10/25/20 161 lb 3.2 oz (73.1 kg)    Glycemic control:   Lab Results  Component Value Date   HGBA1C 6.2 (A) 02/28/2021   HGBA1C 5.9 (  H) 09/23/2020   HGBA1C 5.2 06/21/2020   Lab Results  Component Value Date   MICROALBUR <0.7 04/27/2019   LDLCALC 65 02/23/2020   CREATININE 1.9 (A) 01/13/2021   Lab Results  Component Value Date   MICRALBCREAT 0.6 04/27/2019   Microalbumin ratio 9 in 12/2015  Lab Results  Component Value Date   FRUCTOSAMINE 340 (H) 06/21/2020   FRUCTOSAMINE 340 (H) 02/23/2020   FRUCTOSAMINE 352 (H) 09/21/2019      Allergies as of 02/28/2021       Reactions   Exenatide Other (See Comments)   Linagliptin Other (See Comments)   Lasix [furosemide] Other (See Comments)   Constipation        Medication List        Accurate as of February 28, 2021  8:30 AM. If you have any questions, ask your nurse or doctor.          Accu-Chek Softclix Lancet Dev Kit   alfuzosin 10 MG 24 hr tablet Commonly known as: UROXATRAL Take 10 mg by mouth daily with breakfast.   amLODipine 10 MG  tablet Commonly known as: NORVASC Take 1 tablet (10 mg total) by mouth daily.   aspirin 81 MG tablet Take 81 mg by mouth daily.   CENTRUM SILVER ADULT 50+ PO Take by mouth.   cyclobenzaprine 10 MG tablet Commonly known as: FLEXERIL Take 1 tablet (10 mg total) by mouth 3 (three) times daily as needed for muscle spasms.   doxycycline 100 MG tablet Commonly known as: VIBRA-TABS Take by mouth.   Droplet Pen Needles 31G X 5 MM Misc Generic drug: Insulin Pen Needle USE AS DIRECTED TWICE DAILY   furosemide 40 MG tablet Commonly known as: LASIX Take 40 mg by mouth 2 (two) times daily.   gabapentin 300 MG capsule Commonly known as: NEURONTIN Take 1 capsule (300 mg total) by mouth 3 (three) times daily as needed (nerve pain).   glucose blood test strip Commonly known as: Accu-Chek Guide Use to check blood sugars 2 times daily   Accu-Chek Guide test strip Generic drug: glucose blood TEST  BLOOD  SUGARS TWICE DAILY   hydrALAZINE 50 MG tablet Commonly known as: APRESOLINE Take 1 tablet (50 mg total) by mouth 3 (three) times daily.   HYDROcodone-acetaminophen 5-325 MG tablet Commonly known as: NORCO/VICODIN Take 1 tablet by mouth every 4 (four) hours as needed for moderate pain ((score 4 to 6)).   losartan 100 MG tablet Commonly known as: COZAAR Take 1 tablet (100 mg total) by mouth daily. What changed: how much to take   NovoLOG Mix 70/30 FlexPen (70-30) 100 UNIT/ML FlexPen Generic drug: insulin aspart protamine - aspart INJECT 8 UNITS UNDER THE SKIN BEFORE BREAKFAST AND 8 UNITS AT DINNER. DISCARD PEN 14 DAYS AFTER OPENING   Omega-3 1400 MG Caps Take 1,400 mg by mouth daily.   repaglinide 1 MG tablet Commonly known as: PRANDIN TAKE ONE TABLET BEFORE BREAKFAST AND LUNCH   simvastatin 40 MG tablet Commonly known as: ZOCOR Take 1 tablet (40 mg total) by mouth daily at 6 PM.   TRUEplus Lancets 28G Misc TEST BLOOD SUGAR TWICE DAILY   vitamin C 1000 MG tablet Take  1,000 mg by mouth daily.        Allergies:  Allergies  Allergen Reactions   Exenatide Other (See Comments)   Linagliptin Other (See Comments)   Lasix [Furosemide] Other (See Comments)    Constipation    Past Medical History:  Diagnosis Date  Allergy    Anemia    Asthma    Atherosclerosis of abdominal aorta ([place]) 02/26/2020   Seen on x-ray L-spine September 2021   BPH (benign prostatic hyperplasia)    CAD (coronary artery disease)    a. s/p MI 09/1993;  b. known CTO of RCA;  c. 06/2014 Cath: LM short/mod dzs, LAD 95ost, LCX nl, RI nl, RCA 100, EF 55%;  c. 06/2014 s/p CABG x 4: LIMA->LAD, VG->D1, VG->OM1, VG->Acute Marginal.   Carotid stenosis    a. Carotid US 7/49:  RICA 44-96%; LICA 75-91% // Carotid US 1/22: R 1-39; L 40-59; L VA aberrant flow; bilat subclavian stenosis    CKD (chronic kidney disease), stage III (HCC)    Constipation    in the past 2-3 weeks   Diabetes ([place])    AODM   Diastolic dysfunction    a. 05/2015 Echo: EF 60-65%, LVH, Gr 1 DD, mild MR, mod dil RA/LA, mod TR, PASP 55mmHg.   Essential hypertension    GERD (gastroesophageal reflux disease)    History of kidney stones    Hyperlipidemia     Past Surgical History:  Procedure Laterality Date   ANGIOPLASTY  09/22/1993   POBA of RCA (Dr. Marella Chimes)   [place]   back fusion - Dr. Earnie Larsson   CARDIAC CATHETERIZATION  11/28/1998   "silent occlusion" of RCA w/collaterals from left coronary system, prox LAD w/40-50% eccentric narrowing, 1st diagonal with 70-80% eccentric narrowing, 85% narrowing of prox small OM1 (Dr. Marella Chimes)   CAROTID DOPPLER  07/2011   left subclavian (50-69%); right bulb (0-49%); RICA (normal); left mid-distal CCA (0-49%); left bulb/prox ICA (50-69%); left vertebral with abnormal antegrade flow   CORONARY ARTERY BYPASS GRAFT N/A 05/21/2014   Procedure: CORONARY ARTERY BYPASS GRAFTING (CABG) x  four, using left internal mammary artery and right leg greater saphenous vein  harvested endoscopically;  Surgeon: Melrose Nakayama, MD;  Location: [facility];  Service: Open Heart Surgery;  Laterality: N/A;   INTRAOPERATIVE TRANSESOPHAGEAL ECHOCARDIOGRAM N/A 05/21/2014   Procedure: INTRAOPERATIVE TRANSESOPHAGEAL ECHOCARDIOGRAM;  Surgeon: Melrose Nakayama, MD;  Location: [facility];  Service: Open Heart Surgery;  Laterality: N/A;   LEFT HEART CATHETERIZATION WITH CORONARY ANGIOGRAM N/A 05/19/2014   Procedure: LEFT HEART CATHETERIZATION WITH CORONARY ANGIOGRAM;  Surgeon: Blane Ohara, MD;  Location: [facility] CATH LAB;  Service: Cardiovascular;  Laterality: N/A;   LUMBAR LAMINECTOMY/DECOMPRESSION MICRODISCECTOMY Left 09/23/2020   Procedure: Laminectomy and Foraminotomy - left - Lumbar three-Lumbar four;  Surgeon: Earnie Larsson, MD;  Location: [facility];  Service: Neurosurgery;  Laterality: Left;   NASAL SINUS SURGERY  2010   NM MYOCAR PERF WALL MOTION  09/19/2009   bruce myoview - mild perfusion defect in basal inferior region (infarct/scar), EF 60%, low risk scan   RENAL DOPPLER  10/2011   SMA w/ 70-99% diameter reduction & high grade stenosis; R&L renals w/narrowing and increased velocities (60-99%), R kidney smaller than L   TRANSTHORACIC ECHOCARDIOGRAM  10/20/2012   EF 55-60%, moderate concentric hypertrophy, ventricular septum thickness increased, calcified MV annulus   VASECTOMY  1963    Family History  Problem Relation Age of Onset   Uterine cancer Mother    Stroke Father    Diabetes Sister    Colon polyps Brother    Heart disease Brother    Colon cancer Neg Hx    Esophageal cancer Neg Hx    Rectal cancer Neg Hx    Stomach cancer Neg Hx  Social History:  reports that he quit smoking about 48 years ago. His smoking use included cigarettes. His smokeless tobacco use includes chew. He reports current alcohol use of about 3.0 standard drinks per week. He reports that he does not use drugs.   Review of Systems   Lipid history: Taking simvastatin 40 mg, most recent LDL  as follows This is prescribed by cardiologist  No recent labs available   Lab Results  Component Value Date   CHOL 139 02/23/2020   HDL 60.60 02/23/2020   LDLCALC 65 02/23/2020   LDLDIRECT 45.0 04/17/2017   TRIG 65.0 02/23/2020   CHOLHDL 2 02/23/2020           Hypertension: Blood pressure is treated with hydralazine, losartan 50 and he is also on Lasix and amlodipine Followed by nephrologist   BP Readings from Last 3 Encounters:  02/28/21 140/62  02/22/21 (!) 160/62  10/25/20 (!) 150/68   CKD: Renal function has been as follows  Has been given Lasix 40 mg for edema, followed by nephrologist every 3 months  Lab Results  Component Value Date   CREATININE 1.9 (A) 01/13/2021   CREATININE 2.54 (H) 09/23/2020   CREATININE 2.12 (H) 06/21/2020     Most recent eye exam was 1/21  Most recent foot exam: 02/2021   LABS:  Office Visit on 02/28/2021  Component Date Value Ref Range Status   Hemoglobin A1C 02/28/2021 6.2 (A) 4.0 - 5.6 % Final    Physical Examination:  BP 140/62 (BP Location: Left Arm, Patient Position: Sitting, Cuff Size: Normal)   Pulse 74   Ht $R'5\' 10"'OB$  (1.778 m)   Wt 174 lb 3.2 oz (79 kg)   SpO2 97%   BMI 25.00 kg/m    2+ edema present  Diabetic Foot Exam - Simple   Simple Foot Form Diabetic Foot exam was performed with the following findings: Yes   Visual Inspection No deformities, no ulcerations, no other skin breakdown bilaterally: Yes Sensation Testing See comments: Yes Pulse Check Posterior Tibialis and Dorsalis pulse intact bilaterally: Yes Comments Absent monofilament sensation in the right distal foot, markedly decreased in the left toes and plantar surfaces      ASSESSMENT:  Diabetes type 2, insulin requiring  See history of present illness for discussion of current diabetes management, blood sugar patterns and problems identified  He is on a regimen of Prandin and low-dose premixed insulin twice a day  [name] is 6.2    Overall his blood sugars are usually 126 but frequently has high readings when checked late afternoon  As before not clear what his blood sugars are after meals  He has had about 3 low blood sugars in the last month, twice before dinnertime  This is likely related to either skipping lunch or eating a low carbohydrate meal at lunchtime  However he has blood sugars as high as 280 and dinnertime and not clear why he has been significant variability, he is poor historian overall  Has also been exercising lately which he does right after breakfast and this does not apparently cause low sugars  Peripheral neuropathy: He has sensory loss Patient brochure on foot care given   PLAN:   Given written instructions for all the changes  Considering he has low normal readings at times in the morning we will reduce suppertime dose to 6 units  Also unless he is not eating any carbohydrates he will take Prandin at dinnertime also since he likely has high readings after  eating  Emphasized the need to check readings every other day at bedtime  Also likely needs some readings after breakfast especially on the days he is exercising  Since he likely will have difficulty with more complicated regimens he will continue premixed insulin twice a day for now  Likely needs 80 mg of Lasix for edema but he will confirm with his nephrologist    Patient Instructions  REDUCE INSULIN at supper to 6 units  MUST check sugar at bedtime every 2 days  If eating any starchy or greasy food at dinner take $RemoveBef'1mg'[id number]$  Repaganide at supper too    Elayne Snare 02/28/2021, 8:30 AM   Note: This office note was prepared with Dragon voice recognition system technology. Any transcriptional errors that result from this process are unintentional.

## 2021-04-20 DIAGNOSIS — I129 Hypertensive chronic kidney disease with stage 1 through stage 4 chronic kidney disease, or unspecified chronic kidney disease: Secondary | ICD-10-CM | POA: Diagnosis not present

## 2021-04-20 DIAGNOSIS — L03811 Cellulitis of head [any part, except face]: Secondary | ICD-10-CM | POA: Diagnosis not present

## 2021-04-20 DIAGNOSIS — Z87891 Personal history of nicotine dependence: Secondary | ICD-10-CM | POA: Diagnosis not present

## 2021-04-20 DIAGNOSIS — N183 Chronic kidney disease, stage 3 unspecified: Secondary | ICD-10-CM | POA: Diagnosis not present

## 2021-04-20 DIAGNOSIS — Z9621 Cochlear implant status: Secondary | ICD-10-CM | POA: Diagnosis not present

## 2021-04-21 DIAGNOSIS — E1122 Type 2 diabetes mellitus with diabetic chronic kidney disease: Secondary | ICD-10-CM | POA: Diagnosis not present

## 2021-04-21 DIAGNOSIS — N183 Chronic kidney disease, stage 3 unspecified: Secondary | ICD-10-CM | POA: Diagnosis not present

## 2021-04-21 DIAGNOSIS — R220 Localized swelling, mass and lump, head: Secondary | ICD-10-CM | POA: Diagnosis not present

## 2021-04-21 DIAGNOSIS — N189 Chronic kidney disease, unspecified: Secondary | ICD-10-CM | POA: Diagnosis not present

## 2021-04-21 DIAGNOSIS — I129 Hypertensive chronic kidney disease with stage 1 through stage 4 chronic kidney disease, or unspecified chronic kidney disease: Secondary | ICD-10-CM | POA: Diagnosis not present

## 2021-04-21 DIAGNOSIS — D631 Anemia in chronic kidney disease: Secondary | ICD-10-CM | POA: Diagnosis not present

## 2021-04-21 DIAGNOSIS — N2889 Other specified disorders of kidney and ureter: Secondary | ICD-10-CM | POA: Diagnosis not present

## 2021-04-21 DIAGNOSIS — N400 Enlarged prostate without lower urinary tract symptoms: Secondary | ICD-10-CM | POA: Diagnosis not present

## 2021-04-21 LAB — BASIC METABOLIC PANEL
BUN: 48 — AB (ref 4–21)
CO2: 26 — AB (ref 13–22)
Chloride: 101 (ref 99–108)
Creatinine: 2 — AB (ref 0.6–1.3)
Glucose: 159
Potassium: 4 (ref 3.4–5.3)
Sodium: 140 (ref 137–147)

## 2021-04-21 LAB — COMPREHENSIVE METABOLIC PANEL
Albumin: 4.4 (ref 3.5–5.0)
Calcium: 9.6 (ref 8.7–10.7)
GFR calc non Af Amer: 34

## 2021-04-21 LAB — IRON,TIBC AND FERRITIN PANEL
%SAT: 27
Ferritin: 252
Iron: 52
TIBC: 195
UIBC: 143

## 2021-04-21 LAB — CBC AND DIFFERENTIAL: Hemoglobin: 12.4 — AB (ref 13.5–17.5)

## 2021-04-24 DIAGNOSIS — Z9621 Cochlear implant status: Secondary | ICD-10-CM | POA: Diagnosis not present

## 2021-04-24 DIAGNOSIS — L03811 Cellulitis of head [any part, except face]: Secondary | ICD-10-CM | POA: Diagnosis not present

## 2021-05-02 ENCOUNTER — Encounter: Payer: Self-pay | Admitting: Family Medicine

## 2021-05-04 DIAGNOSIS — Z9621 Cochlear implant status: Secondary | ICD-10-CM | POA: Diagnosis not present

## 2021-05-31 DIAGNOSIS — H25013 Cortical age-related cataract, bilateral: Secondary | ICD-10-CM | POA: Diagnosis not present

## 2021-05-31 DIAGNOSIS — H5203 Hypermetropia, bilateral: Secondary | ICD-10-CM | POA: Diagnosis not present

## 2021-05-31 DIAGNOSIS — H2513 Age-related nuclear cataract, bilateral: Secondary | ICD-10-CM | POA: Diagnosis not present

## 2021-05-31 DIAGNOSIS — E119 Type 2 diabetes mellitus without complications: Secondary | ICD-10-CM | POA: Diagnosis not present

## 2021-05-31 LAB — HM DIABETES EYE EXAM

## 2021-06-01 DIAGNOSIS — L570 Actinic keratosis: Secondary | ICD-10-CM | POA: Diagnosis not present

## 2021-06-05 ENCOUNTER — Other Ambulatory Visit: Payer: Self-pay | Admitting: Cardiovascular Disease

## 2021-06-06 ENCOUNTER — Encounter: Payer: Self-pay | Admitting: Endocrinology

## 2021-06-06 ENCOUNTER — Other Ambulatory Visit: Payer: Self-pay

## 2021-06-06 ENCOUNTER — Ambulatory Visit: Payer: Medicare HMO | Admitting: Endocrinology

## 2021-06-06 VITALS — BP 142/62 | HR 65 | Ht 70.0 in | Wt 175.0 lb

## 2021-06-06 DIAGNOSIS — E1165 Type 2 diabetes mellitus with hyperglycemia: Secondary | ICD-10-CM | POA: Diagnosis not present

## 2021-06-06 DIAGNOSIS — Z794 Long term (current) use of insulin: Secondary | ICD-10-CM

## 2021-06-06 DIAGNOSIS — N184 Chronic kidney disease, stage 4 (severe): Secondary | ICD-10-CM

## 2021-06-06 DIAGNOSIS — E1142 Type 2 diabetes mellitus with diabetic polyneuropathy: Secondary | ICD-10-CM

## 2021-06-06 LAB — POCT GLYCOSYLATED HEMOGLOBIN (HGB A1C): Hemoglobin A1C: 7 % — AB (ref 4.0–5.6)

## 2021-06-06 NOTE — Patient Instructions (Addendum)
Repeglanide BEFORE EACH MEAL  AM INSULIN 6 AT LUNCH AND 8 AT SUPPER  MUST CHECK some sugars at 10 am or 9 pm

## 2021-06-06 NOTE — Progress Notes (Signed)
Patient ID: Justin Salinas, male   DOB: 1941-02-12, 81 y.o.   MRN: [id number]           Reason for Appointment: Follow-up for Type 2 Diabetes    History of Present Illness:          Date of diagnosis of type 2 diabetes mellitus:2000        Background history:   His diabetes had been mild initially and treated with metformin He had subsequently other medications added including Amaryl and Januvia No details are available from PCP office, [name] in 2015 was 7.6  Because of his poor control was started on insulin on 01/03/17. He has been on Actos since 06/2017+ He had a baseline A1c A1c 9.7 on his initial consultation in 01/2017  Recent history:   Insulin regimen: NovoLog mix, 8 units at breakfast and 8 at supper Prandin 1 mg at breakfast and lunch daily  A1c is lower than expected at 7% compared to as low as 5.9  Last fructosamine was 340, usually over 320  Current management, blood sugar patterns and problems identified:  He has overall higher blood sugars at home especially in the evenings  Despite repeated reminders he only checks his blood sugars before breakfast and dinnertime  Although his morning sugars are generally well controlled they have been as low as 74 without hypoglycemia He was told to reduce his evening dose to 6 units but he forgot and is still taking 8 units  Does not report hypoglycemic symptoms  Also if his blood sugars are higher at dinnertime he will take 10 units insulin  He was told to take Prandin at all meals especially restart at dinnertime if he is eating a larger meal However he is not taking the medication after he is eating Today with eating homemade eggs and sausage and biscuits his blood sugars about 3 hours later is only 91 Not clear what his blood sugars are at lunch at home Also likely is not consistent with his diet over the last couple of weeks and over the holidays Previously was going to blow with him but has not done any  exercise However his weight is about the same He thinks his blood sugars are higher from a medication given by his nephrologist but did not see any such medication including on reviewing his nephrologist progress note   Side effects from medications have been: None  Compliance with the medical regimen: Fair  Glucose monitoring:  done 2 times a day         Glucometer:  Accu-Chek guide me .      Blood Glucose readings by download of monitor:  Blood sugar patterns: Significant variability in the evening around 6 PM but blood sugars show only 1 abnormally high reading in the morning; morning sugars tend to be lower when checked earlier in the morning     PRE-MEAL Fasting Lunch Dinner Bedtime Overall  Glucose range: 74-190  147-283  74-283  Mean/median: 124  183  149   POST-MEAL PC Breakfast PC Lunch PC Dinner  Glucose range:  ? ?  Mean/median:      Previously:  PRE-MEAL Fasting Lunch Dinner Bedtime Overall  Glucose range:     52-280  Mean/median: 105  148  126   POST-MEAL PC Breakfast PC Lunch PC Dinner  Glucose range: 182    Mean/median:         Self-care: The diet that the patient has been following is: tries  to limit sweets, fried food       Typical meal intake: Breakfast is eggs,  Oatmeal/ meat, toast.  Lunch: Sandwich or.Crackers with or without peanut butter usually                Dietician visit, most recent: 03/2017   Weight history:   Wt Readings from Last 3 Encounters:  06/06/21 175 lb (79.4 kg)  02/28/21 174 lb 3.2 oz (79 kg)  02/22/21 166 lb 9.6 oz (75.6 kg)    Glycemic control:   Lab Results  Component Value Date   HGBA1C 7.0 (A) 06/06/2021   HGBA1C 6.2 (A) 02/28/2021   HGBA1C 5.9 (H) 09/23/2020   Lab Results  Component Value Date   MICROALBUR <0.7 04/27/2019   LDLCALC 65 02/23/2020   CREATININE 2.0 (A) 04/21/2021   Lab Results  Component Value Date   MICRALBCREAT 0.6 04/27/2019   Microalbumin ratio 9 in 12/2015  Lab Results  Component  Value Date   FRUCTOSAMINE 340 (H) 06/21/2020   FRUCTOSAMINE 340 (H) 02/23/2020   FRUCTOSAMINE 352 (H) 09/21/2019      Allergies as of 06/06/2021       Reactions   Exenatide Other (See Comments)   Linagliptin Other (See Comments)   Lasix [furosemide] Other (See Comments)   Constipation        Medication List        Accurate as of June 06, 2021  3:20 PM. If you have any questions, ask your nurse or doctor.          Accu-Chek Softclix Lancet Dev Kit   alfuzosin 10 MG 24 hr tablet Commonly known as: UROXATRAL Take 10 mg by mouth daily with breakfast.   amLODipine 10 MG tablet Commonly known as: NORVASC Take 1 tablet (10 mg total) by mouth daily.   aspirin 81 MG tablet Take 81 mg by mouth daily.   CENTRUM SILVER ADULT 50+ PO Take by mouth.   cyclobenzaprine 10 MG tablet Commonly known as: FLEXERIL Take 1 tablet (10 mg total) by mouth 3 (three) times daily as needed for muscle spasms.   doxycycline 100 MG tablet Commonly known as: VIBRA-TABS Take by mouth.   Droplet Pen Needles 31G X 5 MM Misc Generic drug: Insulin Pen Needle USE AS DIRECTED TWICE DAILY   furosemide 40 MG tablet Commonly known as: LASIX Take 40 mg by mouth 2 (two) times daily.   gabapentin 300 MG capsule Commonly known as: NEURONTIN Take 1 capsule (300 mg total) by mouth 3 (three) times daily as needed (nerve pain). What changed: Another medication with the same name was removed. Continue taking this medication, and follow the directions you see here. Changed by: Elayne Snare, MD   glucose blood test strip Commonly known as: Accu-Chek Guide Use to check blood sugars 2 times daily   Accu-Chek Guide test strip Generic drug: glucose blood TEST  BLOOD  SUGARS TWICE DAILY   hydrALAZINE 50 MG tablet Commonly known as: APRESOLINE Take 1 tablet (50 mg total) by mouth 3 (three) times daily. What changed: Another medication with the same name was removed. Continue taking this medication,  and follow the directions you see here. Changed by: Elayne Snare, MD   HYDROcodone-acetaminophen 5-325 MG tablet Commonly known as: NORCO/VICODIN Take 1 tablet by mouth every 4 (four) hours as needed for moderate pain ((score 4 to 6)).   losartan 100 MG tablet Commonly known as: COZAAR Take 1 tablet (100 mg total) by mouth daily. What changed: how  much to take   NovoLOG Mix 70/30 FlexPen (70-30) 100 UNIT/ML FlexPen Generic drug: insulin aspart protamine - aspart INJECT 8 UNITS UNDER THE SKIN BEFORE BREAKFAST AND 8 UNITS AT DINNER. DISCARD PEN 14 DAYS AFTER OPENING   Omega-3 1400 MG Caps Take 1,400 mg by mouth daily.   repaglinide 1 MG tablet Commonly known as: PRANDIN TAKE ONE TABLET BEFORE BREAKFAST AND LUNCH   simvastatin 40 MG tablet Commonly known as: ZOCOR Take 1 tablet (40 mg total) by mouth daily at 6 PM. What changed: Another medication with the same name was removed. Continue taking this medication, and follow the directions you see here. Changed by: Elayne Snare, MD   TRUEplus Lancets 28G Misc TEST BLOOD SUGAR TWICE DAILY   vitamin C 1000 MG tablet Take 1,000 mg by mouth daily.        Allergies:  Allergies  Allergen Reactions   Exenatide Other (See Comments)   Linagliptin Other (See Comments)   Lasix [Furosemide] Other (See Comments)    Constipation    Past Medical History:  Diagnosis Date   Allergy    Anemia    Asthma    Atherosclerosis of abdominal aorta ([place]) 02/26/2020   Seen on x-ray L-spine September 2021   BPH (benign prostatic hyperplasia)    CAD (coronary artery disease)    a. s/p MI 09/1993;  b. known CTO of RCA;  c. 06/2014 Cath: LM short/mod dzs, LAD 95ost, LCX nl, RI nl, RCA 100, EF 55%;  c. 06/2014 s/p CABG x 4: LIMA->LAD, VG->D1, VG->OM1, VG->Acute Marginal.   Carotid stenosis    a. Carotid US 5/83:  RICA 09-40%; LICA 76-80% // Carotid US 1/22: R 1-39; L 40-59; L VA aberrant flow; bilat subclavian stenosis    CKD (chronic kidney disease),  stage III (HCC)    Constipation    in the past 2-3 weeks   Diabetes ([place])    AODM   Diastolic dysfunction    a. 05/2015 Echo: EF 60-65%, LVH, Gr 1 DD, mild MR, mod dil RA/LA, mod TR, PASP 38mmHg.   Essential hypertension    GERD (gastroesophageal reflux disease)    History of kidney stones    Hyperlipidemia     Past Surgical History:  Procedure Laterality Date   ANGIOPLASTY  09/22/1993   POBA of RCA (Dr. Marella Chimes)   [place]   back fusion - Dr. Earnie Larsson   CARDIAC CATHETERIZATION  11/28/1998   "silent occlusion" of RCA w/collaterals from left coronary system, prox LAD w/40-50% eccentric narrowing, 1st diagonal with 70-80% eccentric narrowing, 85% narrowing of prox small OM1 (Dr. Marella Chimes)   CAROTID DOPPLER  07/2011   left subclavian (50-69%); right bulb (0-49%); RICA (normal); left mid-distal CCA (0-49%); left bulb/prox ICA (50-69%); left vertebral with abnormal antegrade flow   CORONARY ARTERY BYPASS GRAFT N/A 05/21/2014   Procedure: CORONARY ARTERY BYPASS GRAFTING (CABG) x  four, using left internal mammary artery and right leg greater saphenous vein harvested endoscopically;  Surgeon: Melrose Nakayama, MD;  Location: [facility];  Service: Open Heart Surgery;  Laterality: N/A;   INTRAOPERATIVE TRANSESOPHAGEAL ECHOCARDIOGRAM N/A 05/21/2014   Procedure: INTRAOPERATIVE TRANSESOPHAGEAL ECHOCARDIOGRAM;  Surgeon: Melrose Nakayama, MD;  Location: [facility];  Service: Open Heart Surgery;  Laterality: N/A;   LEFT HEART CATHETERIZATION WITH CORONARY ANGIOGRAM N/A 05/19/2014   Procedure: LEFT HEART CATHETERIZATION WITH CORONARY ANGIOGRAM;  Surgeon: Blane Ohara, MD;  Location: [facility] CATH LAB;  Service: Cardiovascular;  Laterality: N/A;  LUMBAR LAMINECTOMY/DECOMPRESSION MICRODISCECTOMY Left 09/23/2020   Procedure: Laminectomy and Foraminotomy - left - Lumbar three-Lumbar four;  Surgeon: Earnie Larsson, MD;  Location: [facility];  Service: Neurosurgery;  Laterality: Left;   NASAL SINUS  SURGERY  2010   NM MYOCAR PERF WALL MOTION  09/19/2009   bruce myoview - mild perfusion defect in basal inferior region (infarct/scar), EF 60%, low risk scan   RENAL DOPPLER  10/2011   SMA w/ 70-99% diameter reduction & high grade stenosis; R&L renals w/narrowing and increased velocities (60-99%), R kidney smaller than L   TRANSTHORACIC ECHOCARDIOGRAM  10/20/2012   EF 55-60%, moderate concentric hypertrophy, ventricular septum thickness increased, calcified MV annulus   VASECTOMY  1963    Family History  Problem Relation Age of Onset   Uterine cancer Mother    Stroke Father    Diabetes Sister    Colon polyps Brother    Heart disease Brother    Colon cancer Neg Hx    Esophageal cancer Neg Hx    Rectal cancer Neg Hx    Stomach cancer Neg Hx     Social History:  reports that he quit smoking about 49 years ago. His smoking use included cigarettes. His smokeless tobacco use includes chew. He reports current alcohol use of about 3.0 standard drinks per week. He reports that he does not use drugs.   Review of Systems   Lipid history: Taking simvastatin 40 mg, most recent LDL as follows This is prescribed by cardiologist  Last LDL in 2/22 was 45   Lab Results  Component Value Date   CHOL 139 02/23/2020   HDL 60.60 02/23/2020   LDLCALC 65 02/23/2020   LDLDIRECT 45.0 04/17/2017   TRIG 65.0 02/23/2020   CHOLHDL 2 02/23/2020           Hypertension: Blood pressure is treated with hydralazine, losartan 50 and he is also on Lasix and amlodipine 10 mg Followed by nephrologist   BP Readings from Last 3 Encounters:  06/06/21 (!) 142/62  02/28/21 140/62  02/22/21 (!) 160/62   CKD: Renal function has been as follows  Has been taking Lasix 40 mg for edema, followed by nephrologist every 3 months  Lab Results  Component Value Date   CREATININE 2.0 (A) 04/21/2021   CREATININE 1.9 (A) 01/13/2021   CREATININE 2.54 (H) 09/23/2020     Most recent eye exam was 1/21  Most recent  foot exam: 02/2021   LABS:  Office Visit on 06/06/2021  Component Date Value Ref Range Status   Hemoglobin A1C 06/06/2021 7.0 (A)  4.0 - 5.6 % Final    Physical Examination:  BP (!) 142/62    Pulse 65    Ht $R'5\' 10"'fG$  (1.778 m)    Wt 175 lb (79.4 kg)    SpO2 97%    BMI 25.11 kg/m       ASSESSMENT:  Diabetes type 2, insulin requiring  See history of present illness for discussion of current diabetes management, blood sugar patterns and problems identified  He is on a regimen of Prandin and low-dose premixed insulin twice a day  [name] is 7%  [name] is higher than his home blood sugars which are averaging as much is 190 before dinner  Although he has not checked his readings after meals his blood sugar today late morning was not high  Likely has higher blood sugars between lunch and supper from his lunch meal or afternoon snacks  Overnight blood sugars are variable  but reasonably good because of averaging 124 only  [name]  He is taking Prandin after eating instead of before overall his blood sugars are usually 126 but frequently has high readings when checked late afternoon  Reassured him that he is not taking any medications that will increase his blood sugar from his other specialists  Renal dysfunction: Recently stable  PLAN:   Given written instructions for monitoring and insulin doses and he needs to follow instructions given Needs to make sure he takes his Prandin before starting to eat  He will start taking 6 units of insulin at lunchtime and also reduce his morning dose to 6 units He will not increase his evening dose even if blood sugars are high Restart going to the gym but encouraged him to do it this 3 times a week instead of twice Discussed importance of checking readings after meals To call if having any low sugars  Continue Neurontin for neuropathy  If he has not had his labs checked for lipid on the next visit will reorder his lipid  panel  Patient Instructions  Repeglanide BEFORE EACH MEAL  AM INSULIN 6 AT LUNCH AND 8 AT SUPPER  MUST CHECK some sugars at 10 am or 9 pm    Total visit time including counseling = 30 minutes  Elayne Snare 06/06/2021, 3:20 PM   Note: This office note was prepared with Dragon voice recognition system technology. Any transcriptional errors that result from this process are unintentional.

## 2021-06-12 ENCOUNTER — Encounter: Payer: Self-pay | Admitting: Endocrinology

## 2021-06-12 ENCOUNTER — Encounter: Payer: Self-pay | Admitting: Family Medicine

## 2021-06-16 ENCOUNTER — Ambulatory Visit (INDEPENDENT_AMBULATORY_CARE_PROVIDER_SITE_OTHER): Payer: Medicare HMO

## 2021-06-16 ENCOUNTER — Other Ambulatory Visit: Payer: Self-pay

## 2021-06-16 VITALS — BP 140/62 | HR 64 | Temp 98.7°F | Wt 170.6 lb

## 2021-06-16 DIAGNOSIS — Z0000 Encounter for general adult medical examination without abnormal findings: Secondary | ICD-10-CM | POA: Diagnosis not present

## 2021-06-16 NOTE — Progress Notes (Addendum)
Subjective:   Justin Salinas is a 81 y.o. male who presents for Medicare Annual/Subsequent preventive examination.  Review of Systems     Cardiac Risk Factors include: advanced age (>35mn, >>22women);diabetes mellitus;dyslipidemia;hypertension;male gender     Objective:    Today's Vitals   06/16/21 0830 06/16/21 0837  BP: 140/62   Pulse: 64   Temp: 98.7 F (37.1 C)   SpO2: 95%   Weight: 170 lb 9.6 oz (77.4 kg)   PainSc:  5    Body mass index is 24.48 kg/m.  Advanced Directives 06/16/2021 01/07/2021 09/23/2020 09/23/2020 06/10/2020 04/24/2019 03/29/2017  Does Patient Have a Medical Advance Directive? Yes No _0   Type of Advance Directive Living will - Healthcare Power of [place] will Living will;Healthcare Power of Attorney -  Does patient want to make changes to medical advance directive? - - No - Patient declined No - Patient declined - No - Patient declined -  Copy of [place] Chart? - - No - copy requested No - copy requested No - copy requested No - copy requested -  Would patient like information on creating a medical advance directive? - No - Patient declined - - - - -    Current Medications (verified) Outpatient Encounter Medications as of 06/16/2021  Medication Sig   ACCU-CHEK GUIDE test strip TEST  BLOOD  SUGARS TWICE DAILY   alfuzosin (UROXATRAL) 10 MG 24 hr tablet Take 10 mg by mouth daily with breakfast.   amLODipine (NORVASC) 10 MG tablet TAKE 1 TABLET EVERY DAY   Ascorbic Acid (VITAMIN C) 1000 MG tablet Take 1,000 mg by mouth daily.   aspirin 81 MG tablet Take 81 mg by mouth daily.   DROPLET PEN NEEDLES 31G X 5 MM MISC USE AS DIRECTED TWICE DAILY   furosemide (LASIX) 40 MG tablet Take 40 mg by mouth 2 (two) times daily.   gabapentin (NEURONTIN) 300 MG capsule Take 1 capsule (300 mg total) by mouth 3 (three) times daily as needed (nerve pain).   glucose  blood (ACCU-CHEK GUIDE) test strip Use to check blood sugars 2 times daily   insulin aspart protamine - aspart (NOVOLOG MIX 70/30 FLEXPEN) (70-30) 100 UNIT/ML FlexPen INJECT 8 UNITS UNDER THE SKIN BEFORE BREAKFAST AND 8 UNITS AT DINNER. DISCARD PEN 14 DAYS AFTER OPENING   Lancets Misc. (ACCU-CHEK SOFTCLIX LANCET DEV) KIT    Multiple Vitamins-Minerals (CENTRUM SILVER ADULT 50+ PO) Take by mouth.   Omega-3 1400 MG CAPS Take 1,400 mg by mouth daily.   repaglinide (PRANDIN) 1 MG tablet TAKE ONE TABLET BEFORE BREAKFAST AND LUNCH   simvastatin (ZOCOR) 40 MG tablet Take 1 tablet (40 mg total) by mouth daily at 6 PM.   TRUEplus Lancets 28G MISC TEST BLOOD SUGAR TWICE DAILY   hydrALAZINE (APRESOLINE) 50 MG tablet Take 1 tablet (50 mg total) by mouth 3 (three) times daily.   PFIZER COVID-19 VAC BIVALENT injection    [DISCONTINUED] cyclobenzaprine (FLEXERIL) 10 MG tablet Take 1 tablet (10 mg total) by mouth 3 (three) times daily as needed for muscle spasms.   [DISCONTINUED] doxycycline (VIBRA-TABS) 100 MG tablet Take by mouth.   [DISCONTINUED] HYDROcodone-acetaminophen (NORCO/VICODIN) 5-325 MG tablet Take 1 tablet by mouth every 4 (four) hours as needed for moderate pain ((score 4 to 6)). (Patient not taking: Reported on 06/16/2021)   [DISCONTINUED] losartan (COZAAR) 100 MG tablet Take 1 tablet (100 mg total) by  mouth daily. (Patient taking differently: Take 50 mg by mouth daily.)   No facility-administered encounter medications on file as of 06/16/2021.    Allergies (verified) Exenatide, Linagliptin, and Lasix [furosemide]   History: Past Medical History:  Diagnosis Date   Allergy    Anemia    Asthma    Atherosclerosis of abdominal aorta ([place]) 02/26/2020   Seen on x-ray L-spine September 2021   BPH (benign prostatic hyperplasia)    CAD (coronary artery disease)    a. s/p MI 09/1993;  b. known CTO of RCA;  c. 06/2014 Cath: LM short/mod dzs, LAD 95ost, LCX nl, RI nl, RCA 100, EF 55%;  c. 06/2014 s/p  CABG x 4: LIMA->LAD, VG->D1, VG->OM1, VG->Acute Marginal.   Carotid stenosis    a. Carotid US 9/93:  RICA 71-69%; LICA 67-89% // Carotid US 1/22: R 1-39; L 40-59; L VA aberrant flow; bilat subclavian stenosis    CKD (chronic kidney disease), stage III (HCC)    Constipation    in the past 2-3 weeks   Diabetes ([place])    AODM   Diastolic dysfunction    a. 05/2015 Echo: EF 60-65%, LVH, Gr 1 DD, mild MR, mod dil RA/LA, mod TR, PASP 60mHg.   Essential hypertension    GERD (gastroesophageal reflux disease)    History of kidney stones    Hyperlipidemia    Past Surgical History:  Procedure Laterality Date   ANGIOPLASTY  09/22/1993   POBA of RCA (Dr. RMarella Chimes   [facility]  back fusion - Dr. HEarnie Larsson  CARDIAC CATHETERIZATION  11/28/1998   "silent occlusion" of RCA w/collaterals from left coronary system, prox LAD w/40-50% eccentric narrowing, 1st diagonal with 70-80% eccentric narrowing, 85% narrowing of prox small OM1 (Dr. RMarella Chimes   CAROTID DOPPLER  07/2011   left subclavian (50-69%); right bulb (0-49%); RICA (normal); left mid-distal CCA (0-49%); left bulb/prox ICA (50-69%); left vertebral with abnormal antegrade flow   CORONARY ARTERY BYPASS GRAFT N/A 05/21/2014   Procedure: CORONARY ARTERY BYPASS GRAFTING (CABG) x  four, using left internal mammary artery and right leg greater saphenous vein harvested endoscopically;  Surgeon: SMelrose Nakayama MD;  [place] [facility]  Service: Open Heart Surgery;  Laterality: N/A;   INTRAOPERATIVE TRANSESOPHAGEAL ECHOCARDIOGRAM N/A 05/21/2014   Procedure: INTRAOPERATIVE TRANSESOPHAGEAL ECHOCARDIOGRAM;  Surgeon: SMelrose Nakayama MD;  [place] [facility]  Service: Open Heart Surgery;  Laterality: N/A;   LEFT HEART CATHETERIZATION WITH CORONARY ANGIOGRAM N/A 05/19/2014   Procedure: LEFT HEART CATHETERIZATION WITH CORONARY ANGIOGRAM;  Surgeon: MBlane Ohara MD;  [facility];  Service: Cardiovascular;  Laterality: N/A;    LUMBAR LAMINECTOMY/DECOMPRESSION MICRODISCECTOMY Left 09/23/2020   Procedure: Laminectomy and Foraminotomy - left - Lumbar three-Lumbar four;  Surgeon: PEarnie Larsson MD;  [place] [facility]  Service: Neurosurgery;  Laterality: Left;   NASAL SINUS SURGERY  2010   NM MYOCAR PERF WALL MOTION  09/19/2009   bruce myoview - mild perfusion defect in basal inferior region (infarct/scar), EF 60%, low risk scan   RENAL DOPPLER  10/2011   SMA w/ 70-99% diameter reduction & high grade stenosis; R&L renals w/narrowing and increased velocities (60-99%), R kidney smaller than L   TRANSTHORACIC ECHOCARDIOGRAM  10/20/2012   EF 55-60%, moderate concentric hypertrophy, ventricular septum thickness increased, calcified MV annulus   VASECTOMY  1963   Family History  Problem Relation Age of Onset   Uterine cancer Mother    Stroke Father  Diabetes Sister    Colon polyps Brother    Heart disease Brother    Colon cancer Neg Hx    Esophageal cancer Neg Hx    Rectal cancer Neg Hx    Stomach cancer Neg Hx    Social History   Socioeconomic History   Marital status: Widowed    Spouse name: Not on file   Number of children: 2   Years of education: Not on file   Highest education level: Not on file  Occupational History   Occupation: Retired   Tobacco Use   Smoking status: Former    Types: Cigarettes    Quit date: 06/04/1972    Years since quitting: 49.0   Smokeless tobacco: Current    Types: Chew   Tobacco comments:    quit about 40 years  Vaping Use   Vaping Use: Never used  Substance and Sexual Activity   Alcohol use: Yes    Alcohol/week: 3.0 standard drinks    Types: 3 Standard drinks or equivalent per week    Comment: a glass of red wine 3 or 4 days a week.   Drug use: No   Sexual activity: Not on file  Other Topics Concern   Not on file  Social History Narrative   Recently got puppy May 2020   Long-term girlfriend of 68 years died Dec 2595; complications from diabetes   Daughter lives nearby  and assists as needed    Social Determinants of Radio broadcast assistant Strain: Low Risk    Difficulty of Paying Living Expenses: Not hard at all  Food Insecurity: No Food Insecurity   Worried About Charity fundraiser in the Last Year: Never true   Arboriculturist in the Last Year: Never true  Transportation Needs: No Transportation Needs   Lack of Transportation (Medical): No   Lack of Transportation (Non-Medical): No  Physical Activity: Insufficiently Active   Days of Exercise per Week: 2 days   Minutes of Exercise per Session: 60 min  Stress: No Stress Concern Present   Feeling of Stress : Not at all  Social Connections: Moderately Integrated   Frequency of Communication with Friends and Family: Twice a week   Frequency of Social Gatherings with Friends and Family: Twice a week   Attends Religious Services: More than 4 times per year   Active Member of [employer] or Organizations: Yes   Attends Archivist Meetings: 1 to 4 times per year   Marital Status: Widowed    Tobacco Counseling Ready to quit: Not Answered Counseling given: Not Answered Tobacco comments: quit about 40 years   Clinical Intake:  Pre-visit preparation completed: Yes  Pain : 0-10 Pain Score: 5  Pain Type: Acute pain Pain Location: Rib cage (right side rib cage from fall) Pain Orientation: Right Pain Descriptors / Indicators: Sore Pain Onset: 1 to 4 weeks ago Pain Frequency: Constant     BMI - recorded: 24.48 Nutritional Status: BMI of 19-24  Normal Nutritional Risks: None Diabetes: Yes CBG done?: Yes (109) CBG resulted in Enter/ Edit results?: No Did pt. bring in CBG monitor from home?: No  How often do you need to have someone help you when you read instructions, pamphlets, or other written materials from your doctor or pharmacy?: 1 - Never  Diabetic?Nutrition Risk Assessment:2  Has the patient had any N/V/D within the last 2 months?  No  Does the patient have any non-healing  wounds?  No  Has  the patient had any unintentional weight loss or weight gain?  No   Diabetes:  Is the patient diabetic?  Yes  If diabetic, was a CBG obtained today?  Yes  Did the patient bring in their glucometer from home?  No  How often do you monitor your CBG's?  Twice a day    Financial Strains and Diabetes Management:  Are you having any financial strains with the device, your supplies or your medication? No .  Does the patient want to be seen by Chronic Care Management for management of their diabetes?  No  Would the patient like to be referred to a Nutritionist or for Diabetic Management?  No   Diabetic Exams:  Diabetic Eye Exam: Completed 05/31/21 Diabetic Foot Exam: Completed 02/28/21   Interpreter Needed?: No  Information entered by :: Charlott Rakes, LPN   Activities of Daily Living In your present state of health, do you have any difficulty performing the following activities: 06/16/2021 09/23/2020  Hearing? Tempie Donning  Comment wears hearing aids -  Vision? N N  Difficulty concentrating or making decisions? N N  Walking or climbing stairs? N N  Dressing or bathing? N Y  Doing errands, shopping? N N  Preparing Food and eating ? N -  Using the Toilet? N -  In the past six months, have you accidently leaked urine? Y -  Comment at times -  Do you have problems with loss of bowel control? N -  Managing your Medications? N -  Managing your Finances? N -  Housekeeping or managing your Housekeeping? N -  Some recent data might be hidden    Patient Care Team: Vivi Barrack, MD as PCP - General (Family Medicine) [name], MD as PCP - Cardiology (Cardiology) Thornell Sartorius, MD as Consulting Physician (Otolaryngology) Elayne Snare, MD as Consulting Physician (Endocrinology) Drake Leach, [place] as Consulting Physician (Optometry) Corliss Parish, MD as Consulting Physician (Nephrology) Festus Aloe, MD as Consulting Physician (Urology) Corliss Parish, MD as Consulting Physician (Nephrology) Katy Apo, MD as Consulting Physician (Ophthalmology)  Indicate any recent Medical Services you may have received from other than [facility] providers in the past year (date may be approximate).     Assessment:   This is a routine wellness examination for [place].  Hearing/Vision screen Hearing Screening - Comments:: Pt wears hearing aids  Vision Screening - Comments:: Pt follows up with Dr Katy Apo for annual eye exams   Dietary issues and exercise activities discussed: Current Exercise Habits: Home exercise routine, Type of exercise: Other - see comments, Time (Minutes): 60, Frequency (Times/Week): 2, Weekly Exercise (Minutes/Week): 120   Goals Addressed             This Visit's Progress    Patient Stated       Get stronger and healthier        Depression Screen PHQ 2/9 Scores 06/16/2021 06/10/2020 04/24/2019 04/22/2018 07/04/2017 03/29/2017  PHQ - 2 Score 1 1 0 0 0 1    Fall Risk Fall Risk  06/16/2021 06/10/2020 04/24/2019 03/05/2019 07/04/2017  Falls in the past year? 1 0 0 0 No  Number falls in past yr: 1 0 - - -  Injury with Fall? 1 0 0 - -  Comment injured right side rib cage during fall onto equipment - - - -  Risk for fall due to : History of fall(s);Impaired balance/gait;Impaired vision Impaired balance/gait;Impaired vision;Impaired mobility - - -  Follow up Falls prevention discussed Falls prevention  discussed Falls evaluation completed;Education provided;Falls prevention discussed - -    FALL RISK PREVENTION PERTAINING TO THE HOME:  Any stairs in or around the home? Yes  If so, are there any without handrails? No  Home free of loose throw rugs in walkways, pet beds, electrical cords, etc? Yes  Adequate lighting in your home to reduce risk of falls? Yes   ASSISTIVE DEVICES UTILIZED TO PREVENT FALLS:  Life alert? No  Use of a cane, walker or w/c? Yes  at times  Grab bars in the bathroom? Yes  Shower chair or  bench in shower? No  Elevated toilet seat or a handicapped toilet? No   TIMED UP AND GO:  Was the test performed? Yes .  Length of time to ambulate 10 feet: 10 sec.   Gait slow and steady without use of assistive device  Cognitive Function:     6CIT Screen 06/16/2021 06/10/2020 04/24/2019  What Year? 0 points 0 points 0 points  What month? 0 points 0 points 0 points  What time? 0 points - 0 points  Count back from 20 0 points 0 points 0 points  Months in reverse 4 points 4 points 0 points  Repeat phrase 6 points 4 points 0 points  Total Score 10 - 0    Immunizations Immunization History  Administered Date(s) Administered   Fluad Quad(high Dose 65+) 03/02/2019, 02/15/2020, 02/22/2021   Influenza Split 02/23/2009, 02/02/2010, 03/05/2011, 02/18/2012, 02/02/2017   Influenza Whole 03/23/2009   Influenza, High Dose Seasonal PF 02/02/2017, 02/17/2018   Influenza-Unspecified 05/04/2003   PFIZER(Purple Top)SARS-COV-2 Vaccination 07/26/2019, 08/25/2019, 03/17/2020   Pneumococcal Conjugate-13 04/12/2014   Pneumococcal Polysaccharide-23 02/23/2009, 03/23/2009   Tdap 12/03/2016   Zoster, Live 05/29/2013    TDAP status: Up to date  Flu Vaccine status: Up to date  Pneumococcal vaccine status: Up to date  Covid-19 vaccine status: Completed vaccines  Qualifies for Shingles Vaccine? Yes   Zostavax completed Yes   Shingrix Completed?: No.    Education has been provided regarding the importance of this vaccine. Patient has been advised to call insurance company to determine out of pocket expense if they have not yet received this vaccine. Advised may also receive vaccine at local pharmacy or Health Dept. Verbalized acceptance and understanding.  Screening Tests Health Maintenance  Topic Date Due   Zoster Vaccines- Shingrix (1 of 2) Never done   COLONOSCOPY (Pts 45-75yr Insurance coverage will need to be confirmed)  12/11/2019   URINE MICROALBUMIN  04/26/2020   COVID-19 Vaccine (4 -  Booster for Pfizer series) 05/12/2020   HEMOGLOBIN A1C  12/04/2021   FOOT EXAM  02/28/2022   OPHTHALMOLOGY EXAM  05/31/2022   TETANUS/TDAP  12/04/2026   Pneumonia Vaccine 81 Years old  Completed   INFLUENZA VACCINE  Completed   HPV VACCINES  Aged Out    Health Maintenance  Health Maintenance Due  Topic Date Due   Zoster Vaccines- Shingrix (1 of 2) Never done   COLONOSCOPY (Pts 45-433yrInsurance coverage will need to be confirmed)  12/11/2019   URINE MICROALBUMIN  04/26/2020   COVID-19 Vaccine (4 - Booster for [place]) 05/12/2020    Colorectal cancer screening: Type of screening: Colonoscopy. Completed 12/10/16. Repeat every 3 years   Additional Screening:   Vision Screening: Recommended annual ophthalmology exams for early detection of glaucoma and other disorders of the eye. Is the patient up to date with their annual eye exam?  Yes  Who is the provider or what is the  name of the office in which the patient attends annual eye exams? Dr Prudencio Burly  If pt is not established with a provider, would they like to be referred to a provider to establish care? No .   Dental Screening: Recommended annual dental exams for proper oral hygiene  Community Resource Referral / Chronic Care Management: CRR required this visit?  No   CCM required this visit?  No      Plan:     I have personally reviewed and noted the following in the patients chart:   Medical and social history Use of alcohol, tobacco or illicit drugs  Current medications and supplements including opioid prescriptions. Patient is not currently taking opioid prescriptions. Functional ability and status Nutritional status Physical activity Advanced directives List of other physicians Hospitalizations, surgeries, and ER visits in previous 12 months Vitals Screenings to include cognitive, depression, and falls Referrals and appointments  In addition, I have reviewed and discussed with patient certain preventive  protocols, quality metrics, and best practice recommendations. A written personalized care plan for preventive services as well as general preventive health recommendations were provided to patient.     Willette Brace, LPN   10/30/5537   Nurse Notes: pt stated he was told he doesn't have to do colonoscopy any more please advise

## 2021-06-16 NOTE — Patient Instructions (Signed)
Justin Salinas , Thank you for taking time to come for your Medicare Wellness Visit. I appreciate your ongoing commitment to your health goals. Please review the following plan we discussed and let me know if I can assist you in the future.   Screening recommendations/referrals: Colonoscopy: as directed by MD  Recommended yearly ophthalmology/optometry visit for glaucoma screening and checkup Recommended yearly dental visit for hygiene and checkup  Vaccinations: Influenza vaccine: Done 02/22/21 repeat every year  Pneumococcal vaccine: Up to date Tdap vaccine: Done 12/03/16 repeat every 10 years  Shingles vaccine: Shingrix discussed. Please contact your pharmacy for coverage information.    Covid-19: Completed 2/21, 3/23, & 03/17/20  Advanced directives: Please bring a copy of your health care power of attorney and living will to the office at your convenience.  Conditions/risks identified: get stronger and healthier   Next appointment: Follow up in one year for your annual wellness visit.   Preventive Care 83 Years and Older, Male Preventive care refers to lifestyle choices and visits with your health care provider that can promote health and wellness. What does preventive care include? A yearly physical exam. This is also called an annual well check. Dental exams once or twice a year. Routine eye exams. Ask your health care provider how often you should have your eyes checked. Personal lifestyle choices, including: Daily care of your teeth and gums. Regular physical activity. Eating a healthy diet. Avoiding tobacco and drug use. Limiting alcohol use. Practicing safe sex. Taking low doses of aspirin every day. Taking vitamin and mineral supplements as recommended by your health care provider. What happens during an annual well check? The services and screenings done by your health care provider during your annual well check will depend on your age, overall health, lifestyle risk  factors, and family history of disease. Counseling  Your health care provider may ask you questions about your: Alcohol use. Tobacco use. Drug use. Emotional well-being. Home and relationship well-being. Sexual activity. Eating habits. History of falls. Memory and ability to understand (cognition). Work and work Statistician. Screening  You may have the following tests or measurements: Height, weight, and BMI. Blood pressure. Lipid and cholesterol levels. These may be checked every 5 years, or more frequently if you are over 83 years old. Skin check. Lung cancer screening. You may have this screening every year starting at age 83 if you have a 30-pack-year history of smoking and currently smoke or have quit within the past 15 years. Fecal occult blood test (FOBT) of the stool. You may have this test every year starting at age 83. Flexible sigmoidoscopy or colonoscopy. You may have a sigmoidoscopy every 5 years or a colonoscopy every 10 years starting at age 83. Prostate cancer screening. Recommendations will vary depending on your family history and other risks. Hepatitis C blood test. Hepatitis B blood test. Sexually transmitted disease (STD) testing. Diabetes screening. This is done by checking your blood sugar (glucose) after you have not eaten for a while (fasting). You may have this done every 1-3 years. Abdominal aortic aneurysm (AAA) screening. You may need this if you are a current or former smoker. Osteoporosis. You may be screened starting at age 83 if you are at high risk. Talk with your health care provider about your test results, treatment options, and if necessary, the need for more tests. Vaccines  Your health care provider may recommend certain vaccines, such as: Influenza vaccine. This is recommended every year. Tetanus, diphtheria, and acellular pertussis (Tdap, Td) vaccine.  You may need a Td booster every 10 years. Zoster vaccine. You may need this after age  83. Pneumococcal 13-valent conjugate (PCV13) vaccine. One dose is recommended after age 83. Pneumococcal polysaccharide (PPSV23) vaccine. One dose is recommended after age 83. Talk to your health care provider about which screenings and vaccines you need and how often you need them. This information is not intended to replace advice given to you by your health care provider. Make sure you discuss any questions you have with your health care provider. Document Released: 06/17/2015 Document Revised: 02/08/2016 Document Reviewed: 03/22/2015 Elsevier Interactive Patient Education  2017 [place] Prevention in the Home Falls can cause injuries. They can happen to people of all ages. There are many things you can do to make your home safe and to help prevent falls. What can I do on the outside of my home? Regularly fix the edges of walkways and driveways and fix any cracks. Remove anything that might make you trip as you walk through a door, such as a raised step or threshold. Trim any bushes or trees on the path to your home. Use bright outdoor lighting. Clear any walking paths of anything that might make someone trip, such as rocks or tools. Regularly check to see if handrails are loose or broken. Make sure that both sides of any steps have handrails. Any raised decks and porches should have guardrails on the edges. Have any leaves, snow, or ice cleared regularly. Use sand or salt on walking paths during winter. Clean up any spills in your garage right away. This includes oil or grease spills. What can I do in the bathroom? Use night lights. Install grab bars by the toilet and in the tub and shower. Do not use towel bars as grab bars. Use non-skid mats or decals in the tub or shower. If you need to sit down in the shower, use a plastic, non-slip stool. Keep the floor dry. Clean up any water that spills on the floor as soon as it happens. Remove soap buildup in the tub or shower  regularly. Attach bath mats securely with double-sided non-slip rug tape. Do not have throw rugs and other things on the floor that can make you trip. What can I do in the bedroom? Use night lights. Make sure that you have a light by your bed that is easy to reach. Do not use any sheets or blankets that are too big for your bed. They should not hang down onto the floor. Have a firm chair that has side arms. You can use this for support while you get dressed. Do not have throw rugs and other things on the floor that can make you trip. What can I do in the kitchen? Clean up any spills right away. Avoid walking on wet floors. Keep items that you use a lot in easy-to-reach places. If you need to reach something above you, use a strong step stool that has a grab bar. Keep electrical cords out of the way. Do not use floor polish or wax that makes floors slippery. If you must use wax, use non-skid floor wax. Do not have throw rugs and other things on the floor that can make you trip. What can I do with my stairs? Do not leave any items on the stairs. Make sure that there are handrails on both sides of the stairs and use them. Fix handrails that are broken or loose. Make sure that handrails are as long as  the stairways. Check any carpeting to make sure that it is firmly attached to the stairs. Fix any carpet that is loose or worn. Avoid having throw rugs at the top or bottom of the stairs. If you do have throw rugs, attach them to the floor with carpet tape. Make sure that you have a light switch at the top of the stairs and the bottom of the stairs. If you do not have them, ask someone to add them for you. What else can I do to help prevent falls? Wear shoes that: Do not have high heels. Have rubber bottoms. Are comfortable and fit you well. Are closed at the toe. Do not wear sandals. If you use a stepladder: Make sure that it is fully opened. Do not climb a closed stepladder. Make sure that  both sides of the stepladder are locked into place. Ask someone to hold it for you, if possible. Clearly mark and make sure that you can see: Any grab bars or handrails. First and last steps. Where the edge of each step is. Use tools that help you move around (mobility aids) if they are needed. These include: Canes. Walkers. Scooters. Crutches. Turn on the lights when you go into a dark area. Replace any light bulbs as soon as they burn out. Set up your furniture so you have a clear path. Avoid moving your furniture around. If any of your floors are uneven, fix them. If there are any pets around you, be aware of where they are. Review your medicines with your doctor. Some medicines can make you feel dizzy. This can increase your chance of falling. Ask your doctor what other things that you can do to help prevent falls. This information is not intended to replace advice given to you by your health care provider. Make sure you discuss any questions you have with your health care provider. Document Released: 03/17/2009 Document Revised: 10/27/2015 Document Reviewed: 06/25/2014 Elsevier Interactive Patient Education  2017 [employer].

## 2021-06-28 ENCOUNTER — Other Ambulatory Visit: Payer: Self-pay

## 2021-06-28 ENCOUNTER — Ambulatory Visit (HOSPITAL_COMMUNITY)
Admission: RE | Admit: 2021-06-28 | Discharge: 2021-06-28 | Disposition: A | Discharge status: Home / Self Care | Payer: Medicare HMO | Source: Ambulatory Visit | Attending: Cardiovascular Disease | Admitting: Cardiovascular Disease

## 2021-06-28 DIAGNOSIS — I6523 Occlusion and stenosis of bilateral carotid arteries: Secondary | ICD-10-CM | POA: Insufficient documentation

## 2021-06-29 ENCOUNTER — Encounter: Payer: Self-pay | Admitting: Physician Assistant

## 2021-06-30 ENCOUNTER — Other Ambulatory Visit: Payer: Self-pay | Admitting: *Deleted

## 2021-06-30 DIAGNOSIS — I6523 Occlusion and stenosis of bilateral carotid arteries: Secondary | ICD-10-CM

## 2021-06-30 DIAGNOSIS — I2510 Atherosclerotic heart disease of native coronary artery without angina pectoris: Secondary | ICD-10-CM

## 2021-06-30 NOTE — Progress Notes (Signed)
error 

## 2021-07-20 DIAGNOSIS — E1122 Type 2 diabetes mellitus with diabetic chronic kidney disease: Secondary | ICD-10-CM | POA: Diagnosis not present

## 2021-07-20 DIAGNOSIS — D631 Anemia in chronic kidney disease: Secondary | ICD-10-CM | POA: Diagnosis not present

## 2021-07-20 DIAGNOSIS — I129 Hypertensive chronic kidney disease with stage 1 through stage 4 chronic kidney disease, or unspecified chronic kidney disease: Secondary | ICD-10-CM | POA: Diagnosis not present

## 2021-07-20 DIAGNOSIS — N400 Enlarged prostate without lower urinary tract symptoms: Secondary | ICD-10-CM | POA: Diagnosis not present

## 2021-07-20 DIAGNOSIS — N189 Chronic kidney disease, unspecified: Secondary | ICD-10-CM | POA: Diagnosis not present

## 2021-07-20 DIAGNOSIS — N2889 Other specified disorders of kidney and ureter: Secondary | ICD-10-CM | POA: Diagnosis not present

## 2021-07-20 DIAGNOSIS — N183 Chronic kidney disease, stage 3 unspecified: Secondary | ICD-10-CM | POA: Diagnosis not present

## 2021-07-20 LAB — IRON,TIBC AND FERRITIN PANEL
%SAT: 40
Ferritin: 162
Iron: 85
TIBC: 214
UIBC: 129

## 2021-07-20 LAB — COMPREHENSIVE METABOLIC PANEL
Albumin: 4.4 (ref 3.5–5.0)
Calcium: 9.4 (ref 8.7–10.7)
eGFR: 41

## 2021-07-20 LAB — CBC AND DIFFERENTIAL: Hemoglobin: 12.4 — AB (ref 13.5–17.5)

## 2021-07-20 LAB — BASIC METABOLIC PANEL
BUN: 30 — AB (ref 4–21)
CO2: 25 — AB (ref 13–22)
Chloride: 103 (ref 99–108)
Creatinine: 1.7 — AB (ref 0.6–1.3)
Glucose: 205
Potassium: 4.7 (ref 3.4–5.3)
Sodium: 136 — AB (ref 137–147)

## 2021-07-22 ENCOUNTER — Other Ambulatory Visit: Payer: Self-pay | Admitting: Endocrinology

## 2021-07-22 ENCOUNTER — Other Ambulatory Visit: Payer: Self-pay | Admitting: Family Medicine

## 2021-07-25 ENCOUNTER — Telehealth: Payer: Self-pay | Admitting: Family Medicine

## 2021-07-25 NOTE — Telephone Encounter (Signed)
Spoke with Patient daughter stated patient had low back pain running down to leg possible due to sciatic nerve pain  Requesting Inj toradol   Advise patient need OV for evaluation  Schedule Patient for office visit with PCP

## 2021-07-25 NOTE — Telephone Encounter (Signed)
Agree with office visit.  [name]. [name], MD 07/25/2021 2:49 PM

## 2021-07-25 NOTE — Telephone Encounter (Signed)
Pt's daughter Baker Janus called wanting to speak with Dr. [name] and His Assistant. Pt's daughter wants to know if Dr. [name] could give pt a Toradol injection. Please call Baker Janus at [phone] at your earliest convienence.

## 2021-07-27 ENCOUNTER — Encounter: Payer: Self-pay | Admitting: Family Medicine

## 2021-07-27 ENCOUNTER — Other Ambulatory Visit: Payer: Self-pay

## 2021-07-27 ENCOUNTER — Ambulatory Visit (INDEPENDENT_AMBULATORY_CARE_PROVIDER_SITE_OTHER): Payer: Medicare HMO | Admitting: Family Medicine

## 2021-07-27 VITALS — BP 159/61 | HR 69 | Temp 98.7°F | Ht 70.0 in | Wt 174.2 lb

## 2021-07-27 DIAGNOSIS — G8929 Other chronic pain: Secondary | ICD-10-CM

## 2021-07-27 DIAGNOSIS — E1165 Type 2 diabetes mellitus with hyperglycemia: Secondary | ICD-10-CM | POA: Diagnosis not present

## 2021-07-27 DIAGNOSIS — I152 Hypertension secondary to endocrine disorders: Secondary | ICD-10-CM

## 2021-07-27 DIAGNOSIS — M5416 Radiculopathy, lumbar region: Secondary | ICD-10-CM | POA: Diagnosis not present

## 2021-07-27 DIAGNOSIS — M5442 Lumbago with sciatica, left side: Secondary | ICD-10-CM | POA: Diagnosis not present

## 2021-07-27 DIAGNOSIS — E1159 Type 2 diabetes mellitus with other circulatory complications: Secondary | ICD-10-CM | POA: Diagnosis not present

## 2021-07-27 DIAGNOSIS — N183 Chronic kidney disease, stage 3 unspecified: Secondary | ICD-10-CM | POA: Diagnosis not present

## 2021-07-27 DIAGNOSIS — E1122 Type 2 diabetes mellitus with diabetic chronic kidney disease: Secondary | ICD-10-CM | POA: Diagnosis not present

## 2021-07-27 DIAGNOSIS — I700 Atherosclerosis of aorta: Secondary | ICD-10-CM

## 2021-07-27 MED ORDER — METHYLPREDNISOLONE ACETATE 80 MG/ML IJ SUSP
80.0000 mg | Freq: Once | INTRAMUSCULAR | Status: AC
  Administered 2021-07-27: 80 mg via INTRAMUSCULAR

## 2021-07-27 MED ORDER — PREGABALIN 25 MG PO CAPS: 25.0000 mg | ORAL_CAPSULE | Freq: Two times a day (BID) | ORAL | 0 refills | Status: DC

## 2021-07-27 NOTE — Assessment & Plan Note (Signed)
On statin.

## 2021-07-27 NOTE — Patient Instructions (Signed)
It was very nice to see you today!  I am sorry that you are dealing with back pain and sciatica.  I wish we could give you a dose of Toradol however this can be harmful to your kidneys.  We will give you an injection of medication today called Depo-Medrol.  This is also a very good anti-inflammatory.  Please start the Lyrica 25 mg twice daily.  Do not take the gabapentin.  Send me a message or come back to see me in 1 to 2 weeks to let me know how you are doing.  Take care, Dr [name]  PLEASE NOTE:  If you had any lab tests please let us know if you have not heard back within a few days. You may see your results on mychart before we have a chance to review them but we will give you a call once they are reviewed by [place]. If we ordered any referrals today, please let us know if you have not heard from their office within the next week.   Please try these tips to maintain a healthy lifestyle:  Eat at least 3 REAL meals and 1-2 snacks per day.  Aim for no more than 5 hours between eating.  If you eat breakfast, please do so within one hour of getting up.   Each meal should contain half fruits/vegetables, one quarter protein, and one quarter carbs (no bigger than a computer mouse)  Cut down on sweet beverages. This includes juice, soda, and sweet tea.   Drink at least 1 glass of water with each meal and aim for at least 8 glasses per day  Exercise at least 150 minutes every week.

## 2021-07-27 NOTE — Assessment & Plan Note (Signed)
Last A1c at goal.  He will continue management per endocrinology.

## 2021-07-27 NOTE — Addendum Note (Signed)
Addended by: Verlon Setting on: 07/27/2021 10:14 AM   Modules accepted: Orders

## 2021-07-27 NOTE — Assessment & Plan Note (Signed)
We will avoid Toradol.  He recently saw his nephrologist and was told that his kidney numbers are looking "better".  We will get these records.  If GFR is above 30 may consider low-dose of Toradol injection as needed.

## 2021-07-27 NOTE — Assessment & Plan Note (Signed)
Above goal today in setting of acute pain.  Is typically well controlled.  Continue current regimen amlodipine 10 mg daily and hydralazine 50 mg 3 times daily.

## 2021-07-27 NOTE — Progress Notes (Signed)
° °  Justin Salinas is a 81 y.o. male who presents today for an office visit.  Assessment/Plan:  Chronic Problems Addressed Today: Lumbar radiculopathy He has seen multiple specialist for this within the past year including neurosurgery and sports medicine.  Reports he has not had any relief with any intervention thus far.  He has been on gabapentin which does not seem to be effective either.  Initially requested injection of Toradol today however discussed with patient this is not ideal given his history of CKD stage III.  We will give 80 mg of Depo-Medrol today.  We will also start low-dose Lyrica.  He will follow-up with me in 1 to 2 weeks.  We can adjust the dose of Lyrica as needed.  CKD stage 3 due to type 2 diabetes mellitus (HCC) We will avoid Toradol.  He recently saw his nephrologist and was told that his kidney numbers are looking "better".  We will get these records.  If GFR is above 30 may consider low-dose of Toradol injection as needed.  Hypertension associated with diabetes ([place]) Above goal today in setting of acute pain.  Is typically well controlled.  Continue current regimen amlodipine 10 mg daily and hydralazine 50 mg 3 times daily.   Atherosclerosis of abdominal aorta (HCC) On statin.  Uncontrolled type 2 diabetes mellitus with hyperglycemia, without long-term current use of insulin (HCC) Last A1c at goal.  He will continue management per endocrinology.     Subjective:  HPI:  Patient here with low back pain. This is chronic issue which has been getting worse. Pain radiates down into his leg. He saw multiple doctors in the past and had surgery for the back pain. He notes this has not helped. We last saw him the office on 02/26/2020. He was prescribed Prednisone. Today, he notes this has not helped with the symptom. He has seen Dr Justin Salinas on 02/26/2020 for this issue. He was prescribed Gabapentin at that time. He notes this did not helped as well. He is still having low  back pain and this is disturbing his work. He also tried Physical therapy in the past  with no relief. He has been exercising at gym 3 times a week. No weakness or numbness. No reported bowel or bladder incontinence.         Objective:  Physical Exam: BP (!) 159/61 (BP Location: Left Arm)    Pulse 69    Temp 98.7 F (37.1 C) (Temporal)    Ht 5\' 10"  (1.778 m)    Wt 174 lb 3.2 oz (79 kg)    SpO2 97%    BMI 25.00 kg/m   Gen: No acute distress, resting comfortably CV: Regular rate and rhythm with no murmurs appreciated Pulm: Normal work of breathing, clear to auscultation bilaterally with no crackles, wheezes, or rhonchi MSK - Back: No obvious deformities.  Neurovascular intact distally. Neuro: Grossly normal, moves all extremities Psych: Normal affect and thought content       I,Justin Salinas,acting as a scribe for [name], MD.,have documented all relevant documentation on the behalf of [name], MD,as directed by  [name], MD while in the presence of [name], MD.   I, [name], MD, have reviewed all documentation for this visit. The documentation on 07/27/21 for the exam, diagnosis, procedures, and orders are all accurate and complete.  [name]. [name], MD 07/27/2021 10:08 AM

## 2021-07-27 NOTE — Assessment & Plan Note (Signed)
He has seen multiple specialist for this within the past year including neurosurgery and sports medicine.  Reports he has not had any relief with any intervention thus far.  He has been on gabapentin which does not seem to be effective either.  Initially requested injection of Toradol today however discussed with patient this is not ideal given his history of CKD stage III.  We will give 80 mg of Depo-Medrol today.  We will also start low-dose Lyrica.  He will follow-up with me in 1 to 2 weeks.  We can adjust the dose of Lyrica as needed.

## 2021-07-31 ENCOUNTER — Encounter: Payer: Self-pay | Admitting: Family Medicine

## 2021-07-31 ENCOUNTER — Telehealth: Payer: Self-pay | Admitting: Family Medicine

## 2021-07-31 NOTE — Chronic Care Management (AMB) (Signed)
°  Chronic Care Management   Outreach Note  07/31/2021 Name: Justin Salinas MRN: [id number] DOB: [birth year]  Referred by: Vivi Barrack, MD Reason for referral : No chief complaint on file.   An unsuccessful telephone outreach was attempted today. The patient was referred to the pharmacist for assistance with care management and care coordination.   Follow Up Plan:   Tatjana Dellinger Upstream Scheduler

## 2021-08-07 ENCOUNTER — Telehealth: Payer: Self-pay | Admitting: Pharmacist

## 2021-08-07 ENCOUNTER — Telehealth: Payer: Self-pay | Admitting: Family Medicine

## 2021-08-07 NOTE — Progress Notes (Signed)
Chronic Care Management Pharmacy Note  08/08/2021 Name:  Justin Salinas MRN:  [id number] DOB:  01/29/1941  Summary: Initial visit with PharmD.  Points out leg swelling that resolves overnight.  BP has been high, he did not have logs but reports 035D systolic occasionally.  Denies headache or dizziness.  Active at gym and around the house.  Says he was taking additional amlodipine sometimes for high BP - discussed max dose of $Remov'10mg'MHfdRP$ .  Recommendations/Changes made from today's visit: Monitor BP and check in 2 weeks   Plan: FU 6 months pharmD CMA to check BP in two weeks   Subjective: Justin Salinas is an 81 y.o. year old male who is a primary patient of Vivi Barrack, MD.  The CCM team was consulted for assistance with disease management and care coordination needs.    Engaged with patient face to face for initial visit in response to provider referral for pharmacy case management and/or care coordination services.   Consent to Services:  The patient was given the following information about Chronic Care Management services today, agreed to services, and gave verbal consent: 1. CCM service includes personalized support from designated clinical staff supervised by the primary care provider, including individualized plan of care and coordination with other care providers 2. 24/7 contact phone numbers for assistance for urgent and routine care needs. 3. Service will only be billed when office clinical staff spend 20 minutes or more in a month to coordinate care. 4. Only one practitioner may furnish and bill the service in a calendar month. 5.The patient may stop CCM services at any time (effective at the end of the month) by phone call to the office staff. 6. The patient will be responsible for cost sharing (co-pay) of up to 20% of the service fee (after annual deductible is met). Patient agreed to services and consent obtained.  Patient Care Team: Vivi Barrack, MD as PCP - General  (Family Medicine) [name], MD as PCP - Cardiology (Cardiology) Thornell Sartorius, MD as Consulting Physician (Otolaryngology) Elayne Snare, MD as Consulting Physician (Endocrinology) Drake Leach, [place] as Consulting Physician (Optometry) Corliss Parish, MD as Consulting Physician (Nephrology) Festus Aloe, MD as Consulting Physician (Urology) Corliss Parish, MD as Consulting Physician (Nephrology) Katy Apo, MD as Consulting Physician (Ophthalmology) Edythe Clarity, [facility] as Pharmacist (Pharmacist)  Recent office visits:  07/27/2021 OV (PCP) Vivi Barrack, MD; We will give 80 mg of Depo-Medrol today.  We will also start low-dose Lyrica.  He will follow-up with me in 1 to 2 weeks.  We can adjust the dose of Lyrica as needed for Lumbar radiculopathy,  If GFR is above 30 may consider low-dose of Toradol injection as needed.   02/27/2021 Mertie Clause, PA; no medication changes indicated.   02/22/2021 Mertie Clause, PA; no medication changes indicated.     Recent consult visits:  06/21/2021 OP Visit (Audiology) Olen Cordial, AuD; No medication changes indicated.   06/06/2021 OV (Endocrinology) Candiss Norse; he needs to follow instructions given Needs to make sure he takes his Prandin before starting to eat, He will start taking 6 units of insulin at lunchtime and also reduce his morning dose to 6 units He will not increase his evening dose even if blood sugars are high.   05/04/2021 OV (Otolaryngology) Velora Mediate, PA-C; No medication changes indicated.   04/24/2021 OV (Otolaryngology) Velora Mediate, PA-C; Finish 10 day course of Keflex   04/20/2021 OV (Otolaryngology) Loletta Specter,  Theo Dills, PA-C; We will start Keflex 500 BID for 10 days (he has CKD 3) and will get a stat temporal bone CT today or tomorrow to try to assess for abscess. I will have him follow up with me on Monday for recheck.   02/28/2021 OV  (Endocrinology) Elayne Snare, MD; Considering he has low normal readings at times in the morning we will reduce suppertime dose to 6 units  Also unless he is not eating any carbohydrates he will take Prandin at dinnertime also since he likely has high readings after eating , Since he likely will have difficulty with more complicated regimens he will continue premixed insulin twice a day for now. Likely needs 80 mg of Lasix for edema but he will confirm with his nephrologist.     Hospital visits:  None in previous 6 months  Objective:  Lab Results  Component Value Date   CREATININE 1.7 (A) 07/20/2021   BUN 30 (A) 07/20/2021   GFR 28.57 (L) 06/21/2020   EGFR 41 07/20/2021   GFRNONAA 34 04/21/2021   GFRAA 46 (L) 06/19/2018   NA 136 (A) 07/20/2021   K 4.7 07/20/2021   CALCIUM 9.4 07/20/2021   CO2 25 (A) 07/20/2021   GLUCOSE 35 (LL) 09/23/2020    Lab Results  Component Value Date/Time   HGBA1C 7.0 (A) 06/06/2021 09:29 AM   HGBA1C 6.2 (A) 02/28/2021 08:21 AM   HGBA1C 5.9 (H) 09/23/2020 05:51 AM   HGBA1C 7.3 (H) 07/20/2019 10:57 AM   HGBA1C 9.7 12/03/2016 12:00 AM   FRUCTOSAMINE 340 (H) 06/21/2020 01:58 PM   FRUCTOSAMINE 340 (H) 02/23/2020 09:00 AM   GFR 28.57 (L) 06/21/2020 01:58 PM   GFR 39.09 (L) 02/23/2020 09:00 AM   MICROALBUR <0.7 04/27/2019 08:30 AM   MICROALBUR <0.7 02/17/2018 08:46 AM    Last diabetic Eye exam:  Lab Results  Component Value Date/Time   HMDIABEYEEXA No Retinopathy 05/31/2021 12:00 AM    Last diabetic Foot exam: No results found for: HMDIABFOOTEX   Lab Results  Component Value Date   CHOL 139 02/23/2020   HDL 60.60 02/23/2020   LDLCALC 65 02/23/2020   LDLDIRECT 45.0 04/17/2017   TRIG 65.0 02/23/2020   CHOLHDL 2 02/23/2020    Hepatic Function Latest Ref Rng & Units 07/20/2021 04/21/2021 01/13/2021  Total Protein 6.0 - 8.3 g/dL - - -  Albumin 3.5 - 5.0 4.4 4.4 4.3  AST 0 - 37 U/L - - -  ALT 0 - 53 U/L - - -  Alk Phosphatase 39 - 117 U/L - - -   Total Bilirubin 0.2 - 1.2 mg/dL - - -    Lab Results  Component Value Date/Time   TSH 1.55 10/16/2017 08:44 AM    CBC Latest Ref Rng & Units 07/20/2021 04/21/2021 01/13/2021  WBC 4.0 - 10.5 K/uL - - -  Hemoglobin 13.5 - 17.5 12.4(A) 12.4(A) 12.1(A)  Hematocrit 39.0 - 52.0 % - - -  Platelets 150 - 400 K/uL - - -    Lab Results  Component Value Date/Time   VD25OH 45.05 10/22/2017 08:22 AM    Clinical ASCVD: No  The ASCVD Risk score (Arnett DK, et al., 2019) failed to calculate for the following reasons:   The 2019 ASCVD risk score is only valid for ages 13 to 74    Depression screen PHQ 2/9 06/16/2021 06/10/2020 04/24/2019  Decreased Interest 0 0 0  Down, Depressed, Hopeless 1 1 0  PHQ - 2 Score 1 1 0  Some recent data might be hidden     Social History   Tobacco Use  Smoking Status Former   Types: Cigarettes   Quit date: 06/04/1972   Years since quitting: 49.2  Smokeless Tobacco Current   Types: Chew  Tobacco Comments   quit about 40 years   BP Readings from Last 3 Encounters:  07/27/21 (!) 159/61  06/16/21 140/62  06/06/21 (!) 142/62   Pulse Readings from Last 3 Encounters:  07/27/21 69  06/16/21 64  06/06/21 65   Wt Readings from Last 3 Encounters:  07/27/21 174 lb 3.2 oz (79 kg)  06/16/21 170 lb 9.6 oz (77.4 kg)  06/06/21 175 lb (79.4 kg)   BMI Readings from Last 3 Encounters:  07/27/21 25.00 kg/m  06/16/21 24.48 kg/m  06/06/21 25.11 kg/m    Assessment/Interventions: Review of patient past medical history, allergies, medications, health status, including review of consultants reports, laboratory and other test data, was performed as part of comprehensive evaluation and provision of chronic care management services.   SDOH:  (Social Determinants of Health) assessments and interventions performed: Yes  Financial Resource Strain: Low Risk    Difficulty of Paying Living Expenses: Not hard at all   Food Insecurity: No Food Insecurity   Worried About  Charity fundraiser in the Last Year: Never true   Arboriculturist in the Last Year: Never true    SDOH Screenings   Alcohol Screen: Not on file  Depression (PHQ2-9): Low Risk    PHQ-2 Score: 1  Financial Resource Strain: Low Risk    Difficulty of Paying Living Expenses: Not hard at all  Food Insecurity: No Food Insecurity   Worried About Charity fundraiser in the Last Year: Never true   Ran Out of Food in the Last Year: Never true  Housing: Low Risk    Last Housing Risk Score: 0  Physical Activity: Insufficiently Active   Days of Exercise per Week: 2 days   Minutes of Exercise per Session: 60 min  Social Connections: Moderately Integrated   Frequency of Communication with Friends and Family: Twice a week   Frequency of Social Gatherings with Friends and Family: Twice a week   Attends Religious Services: More than 4 times per year   Active Member of [employer] or Organizations: Yes   Attends Archivist Meetings: 1 to 4 times per year   Marital Status: Widowed  Stress: No Stress Concern Present   Feeling of Stress : Not at all  Tobacco Use: High Risk   Smoking Tobacco Use: Former   Smokeless Tobacco Use: Current   Passive Exposure: Not on file  Transportation Needs: No Transportation Needs   Lack of Transportation (Medical): No   Lack of Transportation (Non-Medical): No    CCM Care Plan  Allergies  Allergen Reactions   Exenatide Other (See Comments)   Linagliptin Other (See Comments)   Lasix [Furosemide] Other (See Comments)    Constipation    Medications Reviewed Today     Reviewed by Edythe Clarity, [facility] (Pharmacist) on 08/08/21 at [place] List Status: <None>   Medication Order Taking? Sig Documenting Provider Last Dose Status Informant  ACCU-CHEK GUIDE test strip [id number] No TEST  BLOOD  SUGARS TWICE DAILY Elayne Snare, MD Taking Active   alfuzosin (UROXATRAL) 10 MG 24 hr tablet [id number] No Take 10 mg by mouth daily with breakfast. [provider] Taking Active Self  amLODipine (NORVASC) 10 MG tablet [id number]  No TAKE 1 TABLET EVERY DAY [name], MD Taking Active   Ascorbic Acid (VITAMIN C) 1000 MG tablet [id number] No Take 1,000 mg by mouth daily. [provider] Taking Active Self  aspirin 81 MG tablet [id number] No Take 81 mg by mouth daily. [provider] Taking Active Self           Med Note Gentry Roch   Fri Sep 16, 2020 12:14 PM) On hold  DROPLET PEN NEEDLES 31G X 5 MM MISC [id number] No USE AS DIRECTED TWICE DAILY Elayne Snare, MD Taking Active   furosemide (LASIX) 40 MG tablet [id number] No Take 40 mg by mouth 2 (two) times daily. [provider] Taking Active Self  glucose blood (ACCU-CHEK GUIDE) test strip [id number] No Use to check blood sugars 2 times daily Elayne Snare, MD Taking Active Self  hydrALAZINE (APRESOLINE) 50 MG tablet [id number] No Take 1 tablet (50 mg total) by mouth 3 (three) times daily. [name], MD Taking Expired 06/23/20 2359            Med Note (Angelisa Winthrop L   Tue Aug 08, 2021  1:04 PM) Taking two tablets twice daily  insulin aspart protamine - aspart (NOVOLOG MIX 70/30 FLEXPEN) (70-30) 100 UNIT/ML FlexPen [id number] No INJECT 8 UNITS UNDER THE SKIN BEFORE BREAKFAST AND 8 UNITS AT DINNER. DISCARD PEN 14 DAYS AFTER OPENING Elayne Snare, MD Taking Active   Lancets Misc. (ACCU-CHEK SOFTCLIX LANCET DEV) KIT [id number] No  [provider] Taking Active Self           Med Note Olena Heckle, MICHELE   Tue Feb 12, 2017  8:16 AM)    Multiple Vitamins-Minerals (CENTRUM SILVER ADULT 50+ PO) [id number] No Take by mouth. [provider] Taking Active Self  Omega-3 1400 MG CAPS [id number] No Take 1,400 mg by mouth daily. [provider] Taking Active Self  PFIZER COVID-19 VAC BIVALENT injection [id number] No  [provider] Taking Active   pregabalin (LYRICA) 25 MG capsule [id number]  Take 1 capsule (25 mg total) by mouth 2 (two) times  daily. Vivi Barrack, MD  Active   repaglinide (PRANDIN) 1 MG tablet [id number] No TAKE ONE TABLET BEFORE BREAKFAST AND LUNCH Elayne Snare, MD Taking Active   simvastatin (ZOCOR) 40 MG tablet [id number] No Take 1 tablet (40 mg total) by mouth daily at 6 PM. [name], MD Taking Active Self  TRUEplus Lancets 28G [place] [id number] No TEST BLOOD SUGAR TWICE DAILY Elayne Snare, MD Taking Active   Med List Note Tarry Kos, RN 11/06/17 1229):              Patient Active Problem List   Diagnosis Date Noted   Lumbar radiculopathy 09/23/2020   Atherosclerosis of abdominal aorta ([place]) 02/26/2020   Status post placement of bone anchored hearing aid (BAHA) 05/09/2019   Asymmetric SNHL (sensorineural hearing loss) 04/02/2019   Iron deficiency anemia 10/22/2017   Uncontrolled type 2 diabetes mellitus with hyperglycemia, without long-term current use of insulin ([place]) 01/03/2017   PVD (posterior vitreous detachment), left eye 02/16/2016   Vitreous syneresis of right eye 48/88/9169   Diastolic dysfunction    Hypertension associated with diabetes ([place])    CKD stage 3 due to type 2 diabetes mellitus ([place])    S/P CABG x 4 05/21/2014   Bilateral dry eyes 01/20/2014   Nuclear cataract, bilateral 12/10/2013   Astigmatism 01/11/2012   Hypermetropia 01/11/2012   Presbyopia 01/11/2012   Carotid artery disease ([place]) 12/01/2007  BPH (benign prostatic hyperplasia) 12/01/2007   Dyslipidemia associated with type 2 diabetes mellitus ([place]) 11/28/2007   CAD (coronary artery disease) 11/28/2007   GERD 11/28/2007    Immunization History  Administered Date(s) Administered   Fluad Quad(high Dose 65+) 03/02/2019, 02/15/2020, 02/22/2021   Influenza Split 02/23/2009, 02/02/2010, 03/05/2011, 02/18/2012, 02/02/2017   Influenza Whole 03/23/2009   Influenza, High Dose Seasonal PF 02/02/2017, 02/17/2018   Influenza-Unspecified 05/04/2003   PFIZER(Purple Top)SARS-COV-2 Vaccination 07/26/2019, 08/25/2019,  03/17/2020   Pneumococcal Conjugate-13 04/12/2014   Pneumococcal Polysaccharide-23 02/23/2009, 03/23/2009   Tdap 12/03/2016   Zoster, Live 05/29/2013    Conditions to be addressed/monitored:  CAD, HTN, GERD, Type II DM, HLD  Care Plan : General Pharmacy (Adult)  Updates made by Edythe Clarity, RPH since 08/08/2021 12:00 AM     Problem: CAD, HTN, GERD, Type II DM, HLD   Priority: High  Onset Date: 08/08/2021     Long-Range Goal: Patient-Specific Goal   Start Date: 08/08/2021  Expected End Date: 02/08/2022  This Visit's Progress: On track  Priority: High  Note:   Current Barriers:  Unable to achieve control of BP   Pharmacist Clinical Goal(s):  Patient will achieve improvement in BP as evidenced by monitoring through collaboration with PharmD and provider.   Interventions: 1:1 collaboration with Vivi Barrack, MD regarding development and update of comprehensive plan of care as evidenced by provider attestation and co-signature Inter-disciplinary care team collaboration (see longitudinal plan of care) Comprehensive medication review performed; medication list updated in electronic medical record  Hypertension (BP goal <130/80) -Not ideally controlled -Current treatment: Hydralazine 50mg  two tablets twice daily Appropriate, Query effective, Amlodipine 10mg  daily Appropriate, Effective, Safe, Accessible -Medications previously tried: chorthalidone, losartan, lisinopril  -Current home readings: "high" no specific readings lately -Current dietary habits: breakfast is biggest meal, sandwiches for lunch/dinner -Current exercise habits: active around the yard, goes to gym for cardio/strength exercise two or three times per week -Denies hypotensive/hypertensive symptoms -Educated on BP goals and benefits of medications for prevention of heart attack, stroke and kidney damage; Daily salt intake goal < 2300 mg; Symptoms of hypotension and importance of maintaining adequate  hydration; -Counseled to monitor BP at home daily, document, and provide log at future appointments -Recommended to continue current medication Will have CMA check in on BP readings in two to three weeks He did mention sometimes he takes two amlodipine, will take one and then another later in the day.  Explained him maximum dose is 10mg  - this also could be contributing to increased swelling.  Hyperlipidemia: (LDL goal < 70) -Controlled -Current treatment: Simvastatin 40mg  daily Appropriate, Effective, Safe, Accessible -Medications previously tried: none noted  -Current dietary patterns: see above -Current exercise habits: see above -Educated on Cholesterol goals;  Benefits of statin for ASCVD risk reduction; Importance of limiting foods high in cholesterol; -Recommended to continue current medication LDL at goal < 70 no changes needed  Diabetes (A1c goal <7%) -Controlled -Current medications: Repaglinide 1mg  BID Appropriate, Effective, Safe, Accessible Novolog 70/30 6 units before breakfast and 8 units at dinner Appropriate, Effective, Safe, Accessible -Medications previously tried: metformin, januvia  -Current home glucose readings fasting glucose: 118 this morning, did not bring logs post prandial glucose: not checking after meals -Denies hypoglycemic/hyperglycemic symptoms -Current meal patterns:  breakfast: sausage, eggs, grits, oatmeal  lunch: sandwich  dinner: sandwich snacks:  drinks:  -Current exercise: active around the yard, goes to the gym 2-3 times per week -Educated on A1c and blood sugar goals; Prevention  and management of hypoglycemic episodes; Benefits of routine self-monitoring of blood sugar; -Counseled to check feet daily and get yearly eye exams -Recommended to continue current medication No changes at this time, continue current meds, A1c acceptable at this time and no hypoglycemia.   Patient Goals/Self-Care Activities Patient will:  - take  medications as prescribed as evidenced by patient report and record review check blood pressure daily, document, and provide at future appointments target a minimum of 150 minutes of moderate intensity exercise weekly engage in dietary modifications by limiting salt  Follow Up Plan: The care management team will reach out to the patient again over the next 180 days.        Medication Assistance: None required.  Patient affirms current coverage meets needs.  Compliance/Adherence/Medication fill history: Care Gaps: N/A  Star-Rating Drugs: Simvastatin 40 mg last filled 07/25/2021 90 DS Repaglinide 1 mg last filled 08/03/2021 90 DS  Patient's preferred pharmacy is:  [id number] - [place], [address] [place] [address] Phone: [phone] Fax: [phone]  [place], [place] [address] Phone: [phone] Fax: [phone]  Uses pill box? No - takes out of vials.  Daughter keeps track Pt endorses 100% compliance  We discussed: Benefits of medication synchronization, packaging and delivery as well as enhanced pharmacist oversight with [employer]. Patient decided to: Continue current medication management strategy  Care Plan and Follow Up Patient Decision:  Patient agrees to Care Plan and Follow-up.  Plan: The care management team will reach out to the patient again over the next 180 days.  Beverly Milch, PharmD Clinical Pharmacist  [facility] [phone]

## 2021-08-07 NOTE — Telephone Encounter (Signed)
Pt states he received an injection on 07/27/21 and it helped. He is wanting to know if he can schedule for another one. Please advise. ? ? ?Depo-Medrol ?

## 2021-08-07 NOTE — Progress Notes (Signed)
? ? ?Chronic Care Management ?Pharmacy Assistant  ? ?Name: Justin Salinas  MRN: [id number] DOB: [birth year] ? ? ?Reason for Encounter: Chart Review For Initial Visit With Clinical Pharmacist ?  ?Conditions to be addressed/monitored: ?CAD, HTN, GERD, DM II, CKD, HLD ? ?Primary concerns for visit include: ?HTN, DM II, HLD, CKD  ? ?Recent office visits:  ?07/27/2021 OV (PCP) Vivi Barrack, MD; We will give 80 mg of Depo-Medrol today.  We will also start low-dose Lyrica.  He will follow-up with me in 1 to 2 weeks.  We can adjust the dose of Lyrica as needed for Lumbar radiculopathy,  If GFR is above 30 may consider low-dose of Toradol injection as needed. ? ?02/27/2021 Sherle Poe, Aldona Bar, PA; no medication changes indicated. ? ?02/22/2021 Sherle Poe, Aldona Bar, PA; no medication changes indicated. ?  ? ?Recent consult visits:  ?06/21/2021 OP Visit (Audiology) Olen Cordial, AuD; No medication changes indicated. ? ?06/06/2021 OV (Endocrinology) Candiss Norse; he needs to follow instructions given ?Needs to make sure he takes his Prandin before starting to eat, He will start taking 6 units of insulin at lunchtime and also reduce his morning dose to 6 units ?He will not increase his evening dose even if blood sugars are high. ? ?05/04/2021 OV (Otolaryngology) Velora Mediate, PA-C; No medication changes indicated. ? ?04/24/2021 OV (Otolaryngology) Velora Mediate, PA-C; Finish 10 day course of Keflex ? ?04/20/2021 OV (Otolaryngology) Velora Mediate, PA-C; We will start Keflex 500 BID for 10 days (he has CKD 3) and will get a stat temporal bone CT today or tomorrow to try to assess for abscess. I will have him follow up with me on Monday for recheck. ? ?02/28/2021 OV (Endocrinology) Elayne Snare, MD; Considering he has low normal readings at times in the morning we will reduce suppertime dose to 6 units  ?Also unless he is not eating any carbohydrates he will take Prandin at dinnertime also since  he likely has high readings after eating , Since he likely will have difficulty with more complicated regimens he will continue premixed insulin twice a day for now. Likely needs 80 mg of Lasix for edema but he will confirm with his nephrologist. ? ?02/16/2021 OV (Otolaryngology) Jeneen Rinks; no notes available. ? ?Hospital visits:  ?None in previous 6 months ? ?Medications: ?Outpatient Encounter Medications as of 08/07/2021  ?Medication Sig Note  ? ACCU-CHEK GUIDE test strip TEST  BLOOD  SUGARS TWICE DAILY   ? alfuzosin (UROXATRAL) 10 MG 24 hr tablet Take 10 mg by mouth daily with breakfast.   ? amLODipine (NORVASC) 10 MG tablet TAKE 1 TABLET EVERY DAY   ? Ascorbic Acid (VITAMIN C) 1000 MG tablet Take 1,000 mg by mouth daily.   ? aspirin 81 MG tablet Take 81 mg by mouth daily. 09/16/2020: On hold  ? DROPLET PEN NEEDLES 31G X 5 MM MISC USE AS DIRECTED TWICE DAILY   ? furosemide (LASIX) 40 MG tablet Take 40 mg by mouth 2 (two) times daily.   ? glucose blood (ACCU-CHEK GUIDE) test strip Use to check blood sugars 2 times daily   ? hydrALAZINE (APRESOLINE) 50 MG tablet Take 1 tablet (50 mg total) by mouth 3 (three) times daily.   ? insulin aspart protamine - aspart (NOVOLOG MIX 70/30 FLEXPEN) (70-30) 100 UNIT/ML FlexPen INJECT 8 UNITS UNDER THE SKIN BEFORE BREAKFAST AND 8 UNITS AT DINNER. DISCARD PEN 14 DAYS AFTER OPENING   ? Lancets Misc. (ACCU-CHEK SOFTCLIX LANCET DEV) KIT    ?  Multiple Vitamins-Minerals (CENTRUM SILVER ADULT 50+ PO) Take by mouth.   ? Omega-3 1400 MG CAPS Take 1,400 mg by mouth daily.   ? PFIZER COVID-19 VAC BIVALENT injection    ? pregabalin (LYRICA) 25 MG capsule Take 1 capsule (25 mg total) by mouth 2 (two) times daily.   ? repaglinide (PRANDIN) 1 MG tablet TAKE ONE TABLET BEFORE BREAKFAST AND LUNCH   ? simvastatin (ZOCOR) 40 MG tablet Take 1 tablet (40 mg total) by mouth daily at 6 PM.   ? TRUEplus Lancets 28G MISC TEST BLOOD SUGAR TWICE DAILY   ? ?No facility-administered encounter  medications on file as of 08/07/2021.  ? ?Current Medications: ?Pregabalin 25 mg last filled 07/27/2021 30 DS ?Trueplus Lancets 28G last filled 07/25/2021 90 DS ?Amlodipine 10 mg last filled 06/07/2021 90 DS ?Novolog mix 70/30 last filled 04/03/2021 90 DS ?Droplet Pen Needles 31G last filled 03/20/2021 90 DS ?Repaglinide 1 mg last filled 08/03/2021 90 DS ?Omega-3 1400 mg takes daily ?Simvastatin 40 mg last filled 07/25/2021 90 DS ?Furosemide 40 mg last filled 07/24/2021 90 DS ?Hydralazine 50 mg last filled 07/24/2021 90 DS ?Vitamin C 1000 mg takes daily ?Accu-Chek Guide test strip last filled 07/25/2021 90 DS ?Alfuzosin 10 mg last filled 07/25/2021 90 DS ?Aspirin 81 mg takes daily ?Accu-Chek Softclix Lancets ?Centrum Silver - takes multivitamin, not sure if centrum silver or not ? ?Patient Questions: ?Any changes in your medications or health? ?Patient states he's had a few changes to his medications. Gabapentin was switched to Lyrica and his diabetic medications were changed a little. ? ?Any side effects from any medications?  ?Patient denies any side effects from any of his medications. ? ?Do you have any symptoms or problems not managed by your medications? ?Patient denies having any symptoms or problems that is not currently managed by his medications. ? ?Any concerns about your health right now? ?Patient states he's not able to do things he wants to do. [place] , he states he's "not giving into it" he has trouble "weed eating and stuff like that." ? ?Do you get any type of exercise on a regular basis? ?Patient states he maintains his home by himself and he goes to the gym two to three times a week. ? ?Can you think of a goal you would like to reach for your health? ?"I would like to be like I was when I was 16." ? ?Do you have any problems getting your medications? ?Patient states his daughter helps him manages his medications. He uses [employer] ([facility]). His only issue is having to wait on  this medication to arrive in the mail after he runs out. ? ?Is there anything that you would like to discuss during the appointment?  ?Patient states he would like to discuss his medications. ? ?Please bring medications and supplements to appointment. ? ?Care Gaps: ?Medicare Annual Wellness: Completed 06/16/2021 ?Ophthalmology Exam: Next due on 05/31/2022 ?Foot Exam: Next due on 02/28/2022 ?Hemoglobin A1C: 7.0% on 06/06/2021 ?Colonoscopy: Completed 07/27/2022 ? ?Future Appointments  ?Date Time Provider Department Center  ?08/08/2021 11:00 AM [facility] CCM PHARMACIST [facility]  ?08/11/2021 10:00 AM Reather Littler, MD [facility] None  ?06/29/2022  9:30 AM [facility] HEALTH COACH [facility]  ? ?Star Rating Drugs: ?Novolog mix 70/30 last filled 04/03/2021 90 DS ?Simvastatin 40 mg last filled 07/25/2021 90 DS ?Repaglinide 1 mg last filled 08/03/2021 90 DS ? ?April D Calhoun, CMA ?Clinical Pharmacist Assistant ?[phone]  ?

## 2021-08-07 NOTE — Chronic Care Management (AMB) (Signed)
?  Chronic Care Management  ? ?Note ? ?08/07/2021 ?Name: Adrienne Delay Delone MRN: [id number] DOB: 10/24/1940 ? ?Jie Stickels Dawood is a 81 y.o. year old male who is a primary care patient of Vivi Barrack, MD. I reached out to [place] by phone today in response to a referral sent by Mr. Draco Malczewski Prehn's PCP, Vivi Barrack, MD.  ? ?Mr. Musolino was given information about Chronic Care Management services today including:  ?CCM service includes personalized support from designated clinical staff supervised by his physician, including individualized plan of care and coordination with other care providers ?24/7 contact phone numbers for assistance for urgent and routine care needs. ?Service will only be billed when office clinical staff spend 20 minutes or more in a month to coordinate care. ?Only one practitioner may furnish and bill the service in a calendar month. ?The patient may stop CCM services at any time (effective at the end of the month) by phone call to the office staff. ? ? ?Patient agreed to services and verbal consent obtained.  ? ?Follow up plan: PT IS SCHEDULED FOR INITIAL OUTREACH 09/12/2021'@9'$ :30 OFFICE VISIT ? ? ?Tatjana Dellinger ?[employer] Scheduler  ?

## 2021-08-07 NOTE — Telephone Encounter (Signed)
Spoke with patient, stated doing a lot better since last visit  ?If symptoms worsen will schedule F/U appt with PCP ?

## 2021-08-08 ENCOUNTER — Ambulatory Visit (INDEPENDENT_AMBULATORY_CARE_PROVIDER_SITE_OTHER): Payer: Medicare HMO | Admitting: Pharmacist

## 2021-08-08 DIAGNOSIS — E1159 Type 2 diabetes mellitus with other circulatory complications: Secondary | ICD-10-CM

## 2021-08-08 DIAGNOSIS — E1169 Type 2 diabetes mellitus with other specified complication: Secondary | ICD-10-CM

## 2021-08-08 DIAGNOSIS — I779 Disorder of arteries and arterioles, unspecified: Secondary | ICD-10-CM

## 2021-08-08 DIAGNOSIS — E1165 Type 2 diabetes mellitus with hyperglycemia: Secondary | ICD-10-CM

## 2021-08-08 NOTE — Patient Instructions (Addendum)
Visit Information ? ? Goals Addressed   ? ?  ?  ?  ?  ? This Visit's Progress  ?  Track and Manage My Blood Pressure-Hypertension     ?  Timeframe:  Long-Range Goal ?Priority:  High ?Start Date:   08/08/21                          ?Expected End Date: 02/08/22                      ? ?Follow Up Date 11/08/21  ?  ?- check blood pressure daily ?- choose a place to take my blood pressure (home, clinic or office, retail store)  ?  ?Why is this important?   ?You won't feel high blood pressure, but it can still hurt your blood vessels.  ?High blood pressure can cause heart or kidney problems. It can also cause a stroke.  ?Making lifestyle changes like losing a little weight or eating less salt will help.  ?Checking your blood pressure at home and at different times of the day can help to control blood pressure.  ?If the doctor prescribes medicine remember to take it the way the doctor ordered.  ?/Call the o/ffice if you cannot afford the medicine or if there are questions about it.   ?  ?Notes:  ?  ? ?  ? ?Patient Care Plan: General Pharmacy (Adult)  ?  ? ?Problem Identified: CAD, HTN, GERD, Type II DM, HLD   ?Priority: High  ?Onset Date: 08/08/2021  ?  ? ?Long-Range Goal: Patient-Specific Goal   ?Start Date: 08/08/2021  ?Expected End Date: 02/08/2022  ?This Visit's Progress: On track  ?Priority: High  ?Note:   ?Current Barriers:  ?Unable to achieve control of BP  ? ?Pharmacist Clinical Goal(s):  ?Patient will achieve improvement in BP as evidenced by monitoring through collaboration with PharmD and provider.  ? ?Interventions: ?1:1 collaboration with Vivi Barrack, MD regarding development and update of comprehensive plan of care as evidenced by provider attestation and co-signature ?Inter-disciplinary care team collaboration (see longitudinal plan of care) ?Comprehensive medication review performed; medication list updated in electronic medical record ? ?Hypertension (BP goal <130/80) ?-Not ideally controlled ?-Current  treatment: ?Hydralazine '50mg'$  two tablets twice daily Appropriate, Query effective, ?Amlodipine '10mg'$  daily Appropriate, Effective, Safe, Accessible ?-Medications previously tried: chorthalidone, losartan, lisinopril  ?-Current home readings: "high" no specific readings lately ?-Current dietary habits: breakfast is biggest meal, sandwiches for lunch/dinner ?-Current exercise habits: active around the yard, goes to gym for cardio/strength exercise two or three times per week ?-Denies hypotensive/hypertensive symptoms ?-Educated on BP goals and benefits of medications for prevention of heart attack, stroke and kidney damage; ?Daily salt intake goal < 2300 mg; ?Symptoms of hypotension and importance of maintaining adequate hydration; ?-Counseled to monitor BP at home daily, document, and provide log at future appointments ?-Recommended to continue current medication ?Will have CMA check in on BP readings in two to three weeks ?He did mention sometimes he takes two amlodipine, will take one and then another later in the day.  Explained him maximum dose is '10mg'$  - this also could be contributing to increased swelling. ? ?Hyperlipidemia: (LDL goal < 70) ?-Controlled ?-Current treatment: ?Simvastatin '40mg'$  daily Appropriate, Effective, Safe, Accessible ?-Medications previously tried: none noted  ?-Current dietary patterns: see above ?-Current exercise habits: see above ?-Educated on Cholesterol goals;  ?Benefits of statin for ASCVD risk reduction; ?Importance of limiting foods  high in cholesterol; ?-Recommended to continue current medication ?LDL at goal < 70 no changes needed ? ?Diabetes (A1c goal <7%) ?-Controlled ?-Current medications: ?Repaglinide '1mg'$  BID Appropriate, Effective, Safe, Accessible ?Novolog 70/30 6 units before breakfast and 8 units at dinner Appropriate, Effective, Safe, Accessible ?-Medications previously tried: metformin, [place]  ?-Current home glucose readings ?fasting glucose: 118 this morning, did not  bring logs ?post prandial glucose: not checking after meals ?-Denies hypoglycemic/hyperglycemic symptoms ?-Current meal patterns:  ?breakfast: sausage, eggs, grits, oatmeal  ?lunch: sandwich  ?dinner: sandwich ?snacks:  ?drinks:  ?-Current exercise: active around the yard, goes to the gym 2-3 times per week ?-Educated on A1c and blood sugar goals; ?Prevention and management of hypoglycemic episodes; ?Benefits of routine self-monitoring of blood sugar; ?-Counseled to check feet daily and get yearly eye exams ?-Recommended to continue current medication ?No changes at this time, continue current meds, A1c acceptable at this time and no hypoglycemia. ? ? ?Patient Goals/Self-Care Activities ?Patient will:  ?- take medications as prescribed as evidenced by patient report and record review ?check blood pressure daily, document, and provide at future appointments ?target a minimum of 150 minutes of moderate intensity exercise weekly ?engage in dietary modifications by limiting salt ? ?Follow Up Plan: The care management team will reach out to the patient again over the next 180 days.  ? ?  ? ? ?Mr. Vangorder was given information about Chronic Care Management services today including:  ?CCM service includes personalized support from designated clinical staff supervised by his physician, including individualized plan of care and coordination with other care providers ?24/7 contact phone numbers for assistance for urgent and routine care needs. ?Standard insurance, coinsurance, copays and deductibles apply for chronic care management only during months in which we provide at least 20 minutes of these services. Most insurances cover these services at 100%, however patients may be responsible for any copay, coinsurance and/or deductible if applicable. This service may help you avoid the need for more expensive face-to-face services. ?Only one practitioner may furnish and bill the service in a calendar month. ?The patient may  stop CCM services at any time (effective at the end of the month) by phone call to the office staff. ? ?Patient agreed to services and verbal consent obtained.  ? ?The patient verbalized understanding of instructions, educational materials, and care plan provided today and agreed to receive a mailed copy of patient instructions, educational materials, and care plan.  ?Telephone follow up appointment with pharmacy team member scheduled for: 6 months ? ?Edythe Clarity, [facility]  ?Beverly Milch, PharmD ?Clinical Pharmacist  ?[facility] ?([phone] ? ?

## 2021-08-11 ENCOUNTER — Encounter: Payer: Self-pay | Admitting: Endocrinology

## 2021-08-11 ENCOUNTER — Other Ambulatory Visit: Payer: Self-pay

## 2021-08-11 ENCOUNTER — Ambulatory Visit: Payer: Medicare HMO | Admitting: Endocrinology

## 2021-08-11 VITALS — BP 164/62 | HR 68 | Ht 69.0 in | Wt 170.8 lb

## 2021-08-11 DIAGNOSIS — E1165 Type 2 diabetes mellitus with hyperglycemia: Secondary | ICD-10-CM | POA: Diagnosis not present

## 2021-08-11 DIAGNOSIS — Z794 Long term (current) use of insulin: Secondary | ICD-10-CM | POA: Diagnosis not present

## 2021-08-11 NOTE — Patient Instructions (Signed)
Take 8 units BEFORE breakfast ?Take 6 at LUNCH IF EATING BREAD ? ?Take 6 units before supper ? ?Check blood sugars on waking up 3-4 days a week ? ?Also check blood sugars about 2 hours after meals and do this after different meals by rotation ? ?Recommended blood sugar levels on waking up are 90-130 and about 2 hours after meal is 130-180 ? ?  ? ?

## 2021-08-11 NOTE — Progress Notes (Signed)
Patient ID: Justin Salinas, male   DOB: September 13, 1940, 81 y.o.   MRN: [id number]           Reason for Appointment: Follow-up for Type 2 Diabetes    History of Present Illness:          Date of diagnosis of type 2 diabetes mellitus:2000        Background history:   His diabetes had been mild initially and treated with metformin He had subsequently other medications added including Amaryl and Januvia No details are available from PCP office, [name] in 2015 was 7.6  Because of his poor control was started on insulin on 01/03/17. He has been on Actos since 06/2017+ He had a baseline A1c A1c 9.7 on his initial consultation in 01/2017  Recent history:   Insulin regimen: NovoLog mix, 6 units at breakfast and 8 at supper Prandin 1 mg at breakfast and lunch daily  A1c is on the last visit 7% compared to as low as 5.9 previously  Last fructosamine was 340, usually over 320  Current management, blood sugar patterns and problems identified:  He has not followed instructions for taking insulin 3 times a day and forgets to make the changes  As before he has mostly high readings before dinnertime despite taking Prandin Also despite repeated reminders he only checks his blood sugars before breakfast and dinnertime  At lunchtime he will sometimes eat sandwiches or hamburgers from [employer] but occasionally will just eat beans Also at times he may forget to take the Prandin before eating and will take it after Today he had a large breakfast with eggs and sausage, grits and biscuits  However after his lab work last month his glucose was 205 No hypoglycemic symptoms after dinner or overnight but his lowest blood sugar in the morning is 89 He is going to the gym for exercise twice a week and doing significant amount of aerobic activity Also at home he is usually fairly active with yard work His weight is about the same   Side effects from medications have been: None  Compliance with the  medical regimen: Fair  Glucose monitoring:  done 2 times a day         Glucometer:  Accu-Chek guide me .      Blood Glucose readings by monitor review  PRE-MEAL Fasting Lunch Dinner Bedtime Overall  Glucose range: 89-145  141-274    Mean/median:     145   POST-MEAL PC Breakfast PC Lunch PC Dinner  Glucose range:     Mean/median:      Previously:  PRE-MEAL Fasting Lunch Dinner Bedtime Overall  Glucose range: 74-190  147-283  74-283  Mean/median: 124  183  149   POST-MEAL PC Breakfast PC Lunch PC Dinner  Glucose range:  ? ?  Mean/median:           Self-care: The diet that the patient has been following is: tries to limit sweets, fried food       Typical meal intake: Breakfast is eggs,  Oatmeal/ meat, toast.  Lunch: Sandwich or.Crackers with or without peanut butter usually                Dietician visit, most recent: 03/2017   Weight history:   Wt Readings from Last 3 Encounters:  08/11/21 170 lb 12.8 oz (77.5 kg)  07/27/21 174 lb 3.2 oz (79 kg)  06/16/21 170 lb 9.6 oz (77.4 kg)    Glycemic control:  Lab Results  Component Value Date   HGBA1C 7.0 (A) 06/06/2021   HGBA1C 6.2 (A) 02/28/2021   HGBA1C 5.9 (H) 09/23/2020   Lab Results  Component Value Date   MICROALBUR <0.7 04/27/2019   LDLCALC 65 02/23/2020   CREATININE 1.7 (A) 07/20/2021   Lab Results  Component Value Date   MICRALBCREAT 0.6 04/27/2019   Microalbumin ratio 9 in 12/2015  Lab Results  Component Value Date   FRUCTOSAMINE 340 (H) 06/21/2020   FRUCTOSAMINE 340 (H) 02/23/2020   FRUCTOSAMINE 352 (H) 09/21/2019      Allergies as of 08/11/2021       Reactions   Exenatide Other (See Comments)   Linagliptin Other (See Comments)   Lasix [furosemide] Other (See Comments)   Constipation        Medication List        Accurate as of August 11, 2021 10:07 AM. If you have any questions, ask your nurse or doctor.          Accu-Chek Softclix Lancet Dev Kit   alfuzosin 10 MG 24 hr  tablet Commonly known as: UROXATRAL Take 10 mg by mouth daily with breakfast.   amLODipine 10 MG tablet Commonly known as: NORVASC TAKE 1 TABLET EVERY DAY   aspirin 81 MG tablet Take 81 mg by mouth daily.   CENTRUM SILVER ADULT 50+ PO Take by mouth.   Droplet Pen Needles 31G X 5 MM Misc Generic drug: Insulin Pen Needle USE AS DIRECTED TWICE DAILY   furosemide 40 MG tablet Commonly known as: LASIX Take 40 mg by mouth 2 (two) times daily.   glucose blood test strip Commonly known as: Accu-Chek Guide Use to check blood sugars 2 times daily   Accu-Chek Guide test strip Generic drug: glucose blood TEST  BLOOD  SUGARS TWICE DAILY   hydrALAZINE 50 MG tablet Commonly known as: APRESOLINE Take 1 tablet (50 mg total) by mouth 3 (three) times daily.   NovoLOG Mix 70/30 FlexPen (70-30) 100 UNIT/ML FlexPen Generic drug: insulin aspart protamine - aspart INJECT 8 UNITS UNDER THE SKIN BEFORE BREAKFAST AND 8 UNITS AT DINNER. DISCARD PEN 14 DAYS AFTER OPENING   Omega-3 1400 MG Caps Take 1,400 mg by mouth daily.   Pfizer COVID-19 Vac Bivalent injection Generic drug: COVID-19 mRNA bivalent vaccine (Pfizer)   pregabalin 25 MG capsule Commonly known as: Lyrica Take 1 capsule (25 mg total) by mouth 2 (two) times daily.   repaglinide 1 MG tablet Commonly known as: PRANDIN TAKE ONE TABLET BEFORE BREAKFAST AND LUNCH   simvastatin 40 MG tablet Commonly known as: ZOCOR Take 1 tablet (40 mg total) by mouth daily at 6 PM.   TRUEplus Lancets 28G Misc TEST BLOOD SUGAR TWICE DAILY   vitamin C 1000 MG tablet Take 1,000 mg by mouth daily.        Allergies:  Allergies  Allergen Reactions   Exenatide Other (See Comments)   Linagliptin Other (See Comments)   Lasix [Furosemide] Other (See Comments)    Constipation    Past Medical History:  Diagnosis Date   Allergy    Anemia    Asthma    Atherosclerosis of abdominal aorta ([place]) 02/26/2020   Seen on x-ray L-spine September  2021   BPH (benign prostatic hyperplasia)    CAD (coronary artery disease)    a. s/p MI 09/1993;  b. known CTO of RCA;  c. 06/2014 Cath: LM short/mod dzs, LAD 95ost, LCX nl, RI nl, RCA 100, EF 55%;  c.  06/2014 s/p CABG x 4: LIMA->LAD, VG->D1, VG->OM1, VG->Acute Marginal.   Carotid stenosis    a. Carotid US 0/48:  RICA 88-91%; LICA 69-45% // Carotid US 1/22: R 1-39; L 40-59; L VA aberrant flow; bilat subclavian stenosis // Carotid US 1/23: R 1-39, L 40-59; R subclavian stenosis   CKD (chronic kidney disease), stage III (HCC)    Constipation    in the past 2-3 weeks   Diabetes ([place])    AODM   Diastolic dysfunction    a. 05/2015 Echo: EF 60-65%, LVH, Gr 1 DD, mild MR, mod dil RA/LA, mod TR, PASP 46mHg.   Essential hypertension    GERD (gastroesophageal reflux disease)    History of kidney stones    Hyperlipidemia     Past Surgical History:  Procedure Laterality Date   ANGIOPLASTY  09/22/1993   POBA of RCA (Dr. [place]lla [place]   [place] Dr. [place]ie Larsson  CARDIAC CATHETERIZATION  11/28/1998   "silent occlusion" of RCA w/collaterals from left coronary system, prox LAD w/40-50% eccentric narrowing, 1st diagonal with 70-80% eccentric narrowing, 85% narrowing of prox small OM1 (Dr. RMarella Chimes   CAROTID DOPPLER  07/2011   left subclavian (50-69%); right bulb (0-49%); RICA (normal); left mid-distal CCA (0-49%); left bulb/prox ICA (50-69%); left vertebral with abnormal antegrade flow   CORONARY ARTERY BYPASS GRAFT N/A 05/21/2014   Procedure: CORONARY ARTERY BYPASS GRAFTING (CABG) x  four, using left internal mammary artery and right leg greater saphenous vein harvested endoscopically;  Surgeon: SMelrose Nakayama MD;  [facility]  Service: Open Heart Surgery;  Laterality: N/A;   INTRAOPERATIVE TRANSESOPHAGEAL ECHOCARDIOGRAM N/A 05/21/2014   Procedure: INTRAOPERATIVE TRANSESOPHAGEAL ECHOCARDIOGRAM;  Surgeon: SMelrose Nakayama MD;  [facility]  Service: Open  Heart Surgery;  Laterality: N/A;   LEFT HEART CATHETERIZATION WITH CORONARY ANGIOGRAM N/A 05/19/2014   Procedure: LEFT HEART CATHETERIZATION WITH CORONARY ANGIOGRAM;  Surgeon: MBlane Ohara MD;  [facility];  Service: Cardiovascular;  Laterality: N/A;   LUMBAR LAMINECTOMY/DECOMPRESSION MICRODISCECTOMY Left 09/23/2020   Procedure: Laminectomy and Foraminotomy - left - Lumbar three-Lumbar four;  Surgeon: PEarnie Larsson MD;  [facility]  Service: Neurosurgery;  Laterality: Left;   NASAL SINUS SURGERY  2010   NM MYOCAR PERF WALL MOTION  09/19/2009   bruce myoview - mild perfusion defect in basal inferior region (infarct/scar), EF 60%, low risk scan   RENAL DOPPLER  10/2011   SMA w/ 70-99% diameter reduction & high grade stenosis; R&L renals w/narrowing and increased velocities (60-99%), R kidney smaller than L   TRANSTHORACIC ECHOCARDIOGRAM  10/20/2012   EF 55-60%, moderate concentric hypertrophy, ventricular septum thickness increased, calcified MV annulus   VASECTOMY  1963    Family History  Problem Relation Age of Onset   Uterine cancer Mother    Stroke Father    Diabetes Sister    Colon polyps Brother    Heart disease Brother    Colon cancer Neg Hx    Esophageal cancer Neg Hx    Rectal cancer Neg Hx    Stomach cancer Neg Hx     Social History:  reports that he quit smoking about 49 years ago. His smoking use included cigarettes. His smokeless tobacco use includes chew. He reports current alcohol use of about 3.0 standard drinks per week. He reports that he does not use drugs.   Review of Systems   Lipid history: Taking simvastatin 40 mg, most  recent LDL as follows This is prescribed by cardiologist  Last LDL in 2/22 was 45   Lab Results  Component Value Date   CHOL 139 02/23/2020   HDL 60.60 02/23/2020   LDLCALC 65 02/23/2020   LDLDIRECT 45.0 04/17/2017   TRIG 65.0 02/23/2020   CHOLHDL 2 02/23/2020           Hypertension: Blood pressure is treated with  hydralazine, losartan 50 and he is also on Lasix and amlodipine 10 mg Followed by nephrologist   BP Readings from Last 3 Encounters:  08/11/21 (!) 164/62  07/27/21 (!) 159/61  06/16/21 140/62   CKD: Renal function has been as follows  Has been taking Lasix 40 mg for edema, followed by nephrologist every 3 months  Lab Results  Component Value Date   CREATININE 1.7 (A) 07/20/2021   CREATININE 2.0 (A) 04/21/2021   CREATININE 1.9 (A) 01/13/2021     Most recent eye exam was 1/21  Most recent foot exam: 02/2021   LABS:  No visits with results within 1 Week(s) from this visit.  Latest known visit with results is:  Abstract on 07/31/2021  Component Date Value Ref Range Status   Hemoglobin 07/20/2021 12.4 (A)  13.5 - 17.5 Final    Physical Examination:  BP (!) 164/62    Pulse 68    Ht _0  (1.753 m)    Wt 170 lb 12.8 oz (77.5 kg)    SpO2 95%    BMI 25.22 kg/m       ASSESSMENT:  Diabetes type 2, insulin requiring  See history of present illness for discussion of current diabetes management, blood sugar patterns and problems identified  He is on a regimen of Prandin and low-dose premixed insulin twice a day  [name] is 7% last  He tends to have postprandial hyperglycemia and blood sugars as high as 274 before dinner Likely has consistently high readings after lunch Has difficulty remembering his instructions for taking insulin 3 times a day as discussed above  Renal dysfunction: Recently stable  PLAN:   Again asked him to make sure he follows the written instructions for monitoring and insulin doses  Reminded him that they will take his Prandin before starting to eat  He will start taking 6 units of insulin at lunchtime if eating sandwiches or fast food otherwise he can skip the dose However will REDUCE his dose at suppertime to 6 units to avoid potential of overnight hypoglycemia He will take 8 units in the morning since postprandial readings in the mornings  are relatively higher and he is usually eating significant amount of food including biscuits  Reminded him to call if he has any unusually high or low readings  There are no Patient Instructions on file for this visit.    Elayne Snare 08/11/2021, 10:07 AM   Note: This office note was prepared with Dragon voice recognition system technology. Any transcriptional errors that result from this process are unintentional.

## 2021-08-15 DIAGNOSIS — H25011 Cortical age-related cataract, right eye: Secondary | ICD-10-CM | POA: Diagnosis not present

## 2021-08-15 DIAGNOSIS — H2511 Age-related nuclear cataract, right eye: Secondary | ICD-10-CM | POA: Diagnosis not present

## 2021-08-15 DIAGNOSIS — H25811 Combined forms of age-related cataract, right eye: Secondary | ICD-10-CM | POA: Diagnosis not present

## 2021-08-22 ENCOUNTER — Telehealth: Payer: Self-pay

## 2021-08-22 MED ORDER — PREGABALIN 25 MG PO CAPS: 25.0000 mg | ORAL_CAPSULE | Freq: Two times a day (BID) | ORAL | 5 refills | Status: DC

## 2021-08-22 NOTE — Telephone Encounter (Signed)
Justin Salinas, [place], [place]  [facility] ?Patient requesting refill of Lyrica sent to [facility]. Says helping with his pain pretty well.  ? ?Beverly Milch, PharmD  ?Clinical Pharmacist  ?[place]  ?[phone]  ?

## 2021-08-23 ENCOUNTER — Telehealth: Payer: Self-pay | Admitting: Pharmacist

## 2021-08-23 NOTE — Progress Notes (Signed)
? ? ?Chronic Care Management ?Pharmacy Assistant  ? ?Name: Justin Salinas  MRN: [id number] DOB: 1941-01-24 ? ? ?Reason for Encounter: Hypertension Adherence Call ?  ? ?Recent office visits:  ?None ? ?Recent consult visits:  ?08/11/2021 OV (Endocrinology) Elayne Snare, MD; He will start taking 6 units of insulin at lunchtime if eating sandwiches or fast food otherwise he can skip the dose ?However will REDUCE his dose at suppertime to 6 units to avoid potential of overnight hypoglycemia ?He will take 8 units in the morning since postprandial readings in the mornings are relatively higher and he is usually eating significant amount of food including biscuits ? ?Hospital visits:  ?None in previous 6 months ? ?Medications: ?Outpatient Encounter Medications as of 08/23/2021  ?Medication Sig Note  ? ACCU-CHEK GUIDE test strip TEST  BLOOD  SUGARS TWICE DAILY   ? alfuzosin (UROXATRAL) 10 MG 24 hr tablet Take 10 mg by mouth daily with breakfast.   ? amLODipine (NORVASC) 10 MG tablet TAKE 1 TABLET EVERY DAY   ? Ascorbic Acid (VITAMIN C) 1000 MG tablet Take 1,000 mg by mouth daily.   ? aspirin 81 MG tablet Take 81 mg by mouth daily. 09/16/2020: On hold  ? DROPLET PEN NEEDLES 31G X 5 MM MISC USE AS DIRECTED TWICE DAILY   ? furosemide (LASIX) 40 MG tablet Take 40 mg by mouth 2 (two) times daily.   ? glucose blood (ACCU-CHEK GUIDE) test strip Use to check blood sugars 2 times daily   ? hydrALAZINE (APRESOLINE) 50 MG tablet Take 1 tablet (50 mg total) by mouth 3 (three) times daily. 08/08/2021: Taking two tablets twice daily  ? insulin aspart protamine - aspart (NOVOLOG MIX 70/30 FLEXPEN) (70-30) 100 UNIT/ML FlexPen INJECT 8 UNITS UNDER THE SKIN BEFORE BREAKFAST AND 8 UNITS AT DINNER. DISCARD PEN 14 DAYS AFTER OPENING   ? Lancets Misc. (ACCU-CHEK SOFTCLIX LANCET DEV) KIT    ? Multiple Vitamins-Minerals (CENTRUM SILVER ADULT 50+ PO) Take by mouth.   ? Omega-3 1400 MG CAPS Take 1,400 mg by mouth daily.   ? PFIZER COVID-19 VAC BIVALENT  injection    ? pregabalin (LYRICA) 25 MG capsule Take 1 capsule (25 mg total) by mouth 2 (two) times daily.   ? repaglinide (PRANDIN) 1 MG tablet TAKE ONE TABLET BEFORE BREAKFAST AND LUNCH   ? simvastatin (ZOCOR) 40 MG tablet Take 1 tablet (40 mg total) by mouth daily at 6 PM.   ? TRUEplus Lancets 28G MISC TEST BLOOD SUGAR TWICE DAILY   ? ?No facility-administered encounter medications on file as of 08/23/2021.  ? ?Reviewed chart prior to disease state call. Spoke with patient regarding BP ? ?Recent Office Vitals: ?BP Readings from Last 3 Encounters:  ?08/11/21 (!) 164/62  ?07/27/21 (!) 159/61  ?06/16/21 140/62  ? ?Pulse Readings from Last 3 Encounters:  ?08/11/21 68  ?07/27/21 69  ?06/16/21 64  ?  ?Wt Readings from Last 3 Encounters:  ?08/11/21 170 lb 12.8 oz (77.5 kg)  ?07/27/21 174 lb 3.2 oz (79 kg)  ?06/16/21 170 lb 9.6 oz (77.4 kg)  ?  ? ?Kidney Function ?Lab Results  ?Component Value Date/Time  ? CREATININE 1.7 (A) 07/20/2021 12:00 AM  ? CREATININE 2.0 (A) 04/21/2021 12:00 AM  ? CREATININE 2.54 (H) 09/23/2020 05:51 AM  ? CREATININE 2.12 (H) 06/21/2020 01:58 PM  ? GFR 28.57 (L) 06/21/2020 01:58 PM  ? GFRNONAA 34 04/21/2021 12:00 AM  ? GFRNONAA 25 (L) 09/23/2020 05:51 AM  ? GFRAA 46 (L)  06/19/2018 09:22 AM  ? ? ? ?  Latest Ref Rng & Units 07/20/2021  ? 12:00 AM 04/21/2021  ? 12:00 AM 01/13/2021  ? 12:00 AM  ?BMP  ?BUN 4 - 21 30      48      39       ?Creatinine 0.6 - 1.3 1.7      2.0      1.9       ?Sodium 137 - 147 136      140      142       ?Potassium 3.4 - 5.3 4.7      4.0      4.3       ?Chloride 99 - 108 103      101      107       ?CO2 13 - _0 ?Calcium 8.7 - 10.7 9.4      9.6      9.4       ?  ? This result is from an external source.  ? ? ?Current antihypertensive regimen:  ?Amlodipine 10 mg daily ?Lasix 40 mg twice daily ?Hydralazine 50 mg TID ? ?How often are you checking your Blood Pressure? daily ? ?Current home BP readings: 162,/74, 172/76, 172/67, 164/65, 154/69, 175/74,  151/64 ? ?What recent interventions/DTPs have been made by any provider to improve Blood Pressure control since last CPP Visit: No recent interventions or DTPs. ? ?Any recent hospitalizations or ED visits since last visit with CPP? No ? ?What diet changes have been made to improve Blood Pressure Control?  ?Patient states he has been eating meals. ? ?What exercise is being done to improve your Blood Pressure Control?  ?Patient states he stays active all daily taking care of his house. ? ?Adherence Review: ?Is the patient currently on ACE/ARB medication? No ?Does the patient have >5 day gap between last estimated fill dates? No ? ?Care Gaps: ?Medicare Annual Wellness: Completed 06/16/2021 ?Ophthalmology Exam: Next due on 05/31/2022 ?Foot Exam: Next due on 02/28/2022 ?Hemoglobin A1C: 7.0% on 06/06/2021 ?Colonoscopy: Completed 07/27/2022 ? ?Future Appointments  ?Date Time Provider [place]  ?10/17/2021  8:45 AM Elayne Snare, MD [facility] None  ?02/12/2022  3:00 PM [facility] CCM PHARMACIST [facility]  ?06/29/2022  9:30 AM [facility] HEALTH COACH [facility]  ? ?Star Rating Drugs: ?Novolog mix last filled 04/03/2021 90 DS ?Repaglinide 1 mg last filled 08/03/2021 90 DS ? ?April D Calhoun, [place] ?Clinical Pharmacist Assistant ?[phone]  ?

## 2021-08-30 ENCOUNTER — Encounter: Payer: Self-pay | Admitting: Cardiovascular Disease

## 2021-08-30 NOTE — Progress Notes (Signed)
Called pt to schedule overdue office visit (due 1/23) and pt states that he doesn't feel like he needs to follow with a heart doctor. Pt states that Dr [name] (PCP) handles his meds and will write for his medications that Dr Burt Knack has been prescribing and they monitor all his labs. Will delete current recall for patient. Instructed him to call office if he changes his mind and wishes to see a cardiologist. ?

## 2021-09-01 DIAGNOSIS — I152 Hypertension secondary to endocrine disorders: Secondary | ICD-10-CM | POA: Diagnosis not present

## 2021-09-01 DIAGNOSIS — E1165 Type 2 diabetes mellitus with hyperglycemia: Secondary | ICD-10-CM

## 2021-09-01 DIAGNOSIS — E785 Hyperlipidemia, unspecified: Secondary | ICD-10-CM | POA: Diagnosis not present

## 2021-09-01 DIAGNOSIS — E1159 Type 2 diabetes mellitus with other circulatory complications: Secondary | ICD-10-CM | POA: Diagnosis not present

## 2021-09-04 ENCOUNTER — Other Ambulatory Visit: Payer: Self-pay | Admitting: Cardiovascular Disease

## 2021-09-12 ENCOUNTER — Other Ambulatory Visit: Payer: Self-pay | Admitting: Cardiovascular Disease

## 2021-09-12 ENCOUNTER — Ambulatory Visit: Payer: Medicare HMO

## 2021-09-13 ENCOUNTER — Other Ambulatory Visit: Payer: Self-pay

## 2021-09-13 DIAGNOSIS — E1165 Type 2 diabetes mellitus with hyperglycemia: Secondary | ICD-10-CM

## 2021-09-13 MED ORDER — DROPLET PEN NEEDLES 31G X 5 MM MISC: 2 refills | Status: DC

## 2021-09-19 ENCOUNTER — Ambulatory Visit (INDEPENDENT_AMBULATORY_CARE_PROVIDER_SITE_OTHER): Payer: Medicare HMO | Admitting: Family Medicine

## 2021-09-19 ENCOUNTER — Encounter: Payer: Self-pay | Admitting: Family Medicine

## 2021-09-19 VITALS — BP 183/57 | HR 63 | Temp 98.1°F | Ht 69.0 in | Wt 163.0 lb

## 2021-09-19 DIAGNOSIS — E1159 Type 2 diabetes mellitus with other circulatory complications: Secondary | ICD-10-CM | POA: Diagnosis not present

## 2021-09-19 DIAGNOSIS — E1169 Type 2 diabetes mellitus with other specified complication: Secondary | ICD-10-CM

## 2021-09-19 DIAGNOSIS — E785 Hyperlipidemia, unspecified: Secondary | ICD-10-CM

## 2021-09-19 DIAGNOSIS — E1165 Type 2 diabetes mellitus with hyperglycemia: Secondary | ICD-10-CM

## 2021-09-19 DIAGNOSIS — D508 Other iron deficiency anemias: Secondary | ICD-10-CM

## 2021-09-19 DIAGNOSIS — E1122 Type 2 diabetes mellitus with diabetic chronic kidney disease: Secondary | ICD-10-CM | POA: Diagnosis not present

## 2021-09-19 DIAGNOSIS — N183 Chronic kidney disease, stage 3 unspecified: Secondary | ICD-10-CM

## 2021-09-19 DIAGNOSIS — I152 Hypertension secondary to endocrine disorders: Secondary | ICD-10-CM | POA: Diagnosis not present

## 2021-09-19 DIAGNOSIS — Z0001 Encounter for general adult medical examination with abnormal findings: Secondary | ICD-10-CM

## 2021-09-19 LAB — LIPID PANEL
Cholesterol: 149 mg/dL (ref 0–200)
HDL: 74.7 mg/dL (ref >39.00)
LDL Cholesterol: 62 mg/dL (ref 0–99)
NonHDL: 74.55
Total CHOL/HDL Ratio: 2
Triglycerides: 61 mg/dL (ref 0.0–149.0)
VLDL: 12.2 mg/dL (ref 0.0–40.0)

## 2021-09-19 LAB — COMPREHENSIVE METABOLIC PANEL
ALT: 16 U/L (ref 0–53)
AST: 22 U/L (ref 0–37)
Albumin: 4.4 g/dL (ref 3.5–5.2)
Alkaline Phosphatase: 75 U/L (ref 39–117)
BUN: 33 mg/dL — ABNORMAL HIGH (ref 6–23)
CO2: 28 mEq/L (ref 19–32)
Calcium: 9.4 mg/dL (ref 8.4–10.5)
Chloride: 101 mEq/L (ref 96–112)
Creatinine, Ser: 1.59 mg/dL — ABNORMAL HIGH (ref 0.40–1.50)
GFR: 40 mL/min — ABNORMAL LOW (ref >60.00)
Glucose, Bld: 222 mg/dL — ABNORMAL HIGH (ref 70–99)
Potassium: 4.1 mEq/L (ref 3.5–5.1)
Sodium: 137 mEq/L (ref 135–145)
Total Bilirubin: 0.7 mg/dL (ref 0.2–1.2)
Total Protein: 7.3 g/dL (ref 6.0–8.3)

## 2021-09-19 LAB — TSH: TSH: 2.79 u[IU]/mL (ref 0.35–5.50)

## 2021-09-19 LAB — MICROALBUMIN / CREATININE URINE RATIO
Creatinine,U: 46.6 mg/dL
Microalb Creat Ratio: 1.5 mg/g (ref 0.0–30.0)
Microalb, Ur: <0.7 mg/dL (ref 0.0–1.9)

## 2021-09-19 LAB — CBC
HCT: 39.9 % (ref 39.0–52.0)
Hemoglobin: 13.3 g/dL (ref 13.0–17.0)
MCHC: 33.4 g/dL (ref 30.0–36.0)
MCV: 96.3 fl (ref 78.0–100.0)
Platelets: 154 10*3/uL (ref 150.0–400.0)
RBC: 4.14 Mil/uL — ABNORMAL LOW (ref 4.22–5.81)
RDW: 13.8 % (ref 11.5–15.5)
WBC: 6.1 10*3/uL (ref 4.0–10.5)

## 2021-09-19 LAB — IBC + FERRITIN
Ferritin: 118.6 ng/mL (ref 22.0–322.0)
Iron: 95 ug/dL (ref 42–165)
Saturation Ratios: 39.9 % (ref 20.0–50.0)
TIBC: 238 ug/dL — ABNORMAL LOW (ref 250.0–450.0)
Transferrin: 170 mg/dL — ABNORMAL LOW (ref 212.0–360.0)

## 2021-09-19 MED ORDER — AMOXICILLIN-POT CLAVULANATE 875-125 MG PO TABS: 1.0000 | ORAL_TABLET | Freq: Two times a day (BID) | ORAL | 0 refills | Status: DC

## 2021-09-19 MED ORDER — AZELASTINE HCL 0.1 % NA SOLN: 2.0000 | Freq: Two times a day (BID) | NASAL | 12 refills | Status: DC

## 2021-09-19 NOTE — Assessment & Plan Note (Signed)
Continue management per endocrinology. 

## 2021-09-19 NOTE — Patient Instructions (Signed)
It was very nice to see you today! ? ?Please start the astelin and augmentin for your sinus infection.  ? ?Keep an eye on your blood pressure and let me know if it is persistently elevated. ? ?We will check blood work and a urine sample today. ? ?No medication changes today. ? ?Come back in 1 year for your next physical. Come back sooner if needed.  ? ?Take care, ?Dr [name] ? ?PLEASE NOTE: ? ?If you had any lab tests please let us know if you have not heard back within a few days. You may see your results on mychart before we have a chance to review them but we will give you a call once they are reviewed by [place]. If we ordered any referrals today, please let us know if you have not heard from their office within the next week.  ? ?Please try these tips to maintain a healthy lifestyle: ? ?Eat at least 3 REAL meals and 1-2 snacks per day.  Aim for no more than 5 hours between eating.  If you eat breakfast, please do so within one hour of getting up.  ? ?Each meal should contain half fruits/vegetables, one quarter protein, and one quarter carbs (no bigger than a computer mouse) ? ?Cut down on sweet beverages. This includes juice, soda, and sweet tea.  ? ?Drink at least 1 glass of water with each meal and aim for at least 8 glasses per day ? ?Exercise at least 150 minutes every week.   ? ?Preventive Care 72 Years and Older, Male ?Preventive care refers to lifestyle choices and visits with your health care provider that can promote health and wellness. Preventive care visits are also called wellness exams. ?What can I expect for my preventive care visit? ?Counseling ?During your preventive care visit, your health care provider may ask about your: ?Medical history, including: ?Past medical problems. ?Family medical history. ?History of falls. ?Current health, including: ?Emotional well-being. ?Home life and relationship well-being. ?Sexual activity. ?Memory and ability to understand (cognition). ?Lifestyle,  including: ?Alcohol, nicotine or tobacco, and drug use. ?Access to firearms. ?Diet, exercise, and sleep habits. ?Work and work Statistician. ?Sunscreen use. ?Safety issues such as seatbelt and bike helmet use. ?Physical exam ?Your health care provider will check your: ?Height and weight. These may be used to calculate your BMI (body mass index). BMI is a measurement that tells if you are at a healthy weight. ?Waist circumference. This measures the distance around your waistline. This measurement also tells if you are at a healthy weight and may help predict your risk of certain diseases, such as type 2 diabetes and high blood pressure. ?Heart rate and blood pressure. ?Body temperature. ?Skin for abnormal spots. ?What immunizations do I need? ? ?Vaccines are usually given at various ages, according to a schedule. Your health care provider will recommend vaccines for you based on your age, medical history, and lifestyle or other factors, such as travel or where you work. ?What tests do I need? ?Screening ?Your health care provider may recommend screening tests for certain conditions. This may include: ?Lipid and cholesterol levels. ?Diabetes screening. This is done by checking your blood sugar (glucose) after you have not eaten for a while (fasting). ?Hepatitis C test. ?Hepatitis B test. ?HIV (human immunodeficiency virus) test. ?STI (sexually transmitted infection) testing, if you are at risk. ?Lung cancer screening. ?Colorectal cancer screening. ?Prostate cancer screening. ?Abdominal aortic aneurysm (AAA) screening. You may need this if you are a current or former  smoker. ?Talk with your health care provider about your test results, treatment options, and if necessary, the need for more tests. ?Follow these instructions at home: ?Eating and drinking ? ?Eat a diet that includes fresh fruits and vegetables, whole grains, lean protein, and low-fat dairy products. Limit your intake of foods with high amounts of sugar,  saturated fats, and salt. ?Take vitamin and mineral supplements as recommended by your health care provider. ?Do not drink alcohol if your health care provider tells you not to drink. ?If you drink alcohol: ?Limit how much you have to 0-2 drinks a day. ?Know how much alcohol is in your drink. In the [place] one drink equals one 12 oz bottle of beer (355 mL), one 5 oz glass of wine (148 mL), or one 1? oz glass of hard liquor (44 mL). ?Lifestyle ?Brush your teeth every morning and night with fluoride toothpaste. Floss one time each day. ?Exercise for at least 30 minutes 5 or more days each week. ?Do not use any products that contain nicotine or tobacco. These products include cigarettes, chewing tobacco, and vaping devices, such as e-cigarettes. If you need help quitting, ask your health care provider. ?Do not use drugs. ?If you are sexually active, practice safe sex. Use a condom or other form of protection to prevent STIs. ?Take aspirin only as told by your health care provider. Make sure that you understand how much to take and what form to take. Work with your health care provider to find out whether it is safe and beneficial for you to take aspirin daily. ?Ask your health care provider if you need to take a cholesterol-lowering medicine (statin). ?Find healthy ways to manage stress, such as: ?Meditation, yoga, or listening to music. ?Journaling. ?Talking to a trusted person. ?Spending time with friends and family. ?Safety ?Always wear your seat belt while driving or riding in a vehicle. ?Do not drive: ?If you have been drinking alcohol. Do not ride with someone who has been drinking. ?When you are tired or distracted. ?While texting. ?If you have been using any mind-altering substances or drugs. ?Wear a helmet and other protective equipment during sports activities. ?If you have firearms in your house, make sure you follow all gun safety procedures. ?Minimize exposure to UV radiation to reduce your risk of skin  cancer. ?What's next? ?Visit your health care provider once a year for an annual wellness visit. ?Ask your health care provider how often you should have your eyes and teeth checked. ?Stay up to date on all vaccines. ?This information is not intended to replace advice given to you by your health care provider. Make sure you discuss any questions you have with your health care provider. ?Document Revised: 11/16/2020 Document Reviewed: 11/16/2020 ?Elsevier Patient Education ? [place]. ? ?

## 2021-09-19 NOTE — Progress Notes (Signed)
? ?Chief Complaint:  ?Justin Salinas is a 81 y.o. male who presents today for his annual comprehensive physical exam.   ? ?Assessment/Plan:  ?New/Acute Problems: ?Sinusitis ?No red flags.  Given length of symptoms we will start course of Augmentin.  Also start Astelin nasal spray.  He can continue over-the-counter meds as needed.  He will let me know if not improving. ? ?Chronic Problems Addressed Today: ?CKD stage 3 due to type 2 diabetes mellitus ([place]) ?Continue management per nephrology.  We will check c-Met ? ?Dyslipidemia associated with type 2 diabetes mellitus ([place]) ?Check lipids. He is on simvastatin 40 mg daily. ? ?Hypertension associated with diabetes ([place]) ?Above goal with isolated systolic hypertension today.  Continue management per cardiology with amlodipine 10 mg daily and hydralazine 50 mg 3 times daily. ? ?Iron deficiency anemia ?Check iron panel and CBC. ? ?Uncontrolled type 2 diabetes mellitus with hyperglycemia, without long-term current use of insulin ([place]) ?Continue management per endocrinology. ? ? ?Preventative Healthcare: ?Check labs. UTD on other vaccine and screening. Colonoscopy due in 2024.  ? ?Patient Counseling(The following topics were reviewed and/or handout was given): ? -Nutrition: Stressed importance of moderation in sodium/caffeine intake, saturated fat and cholesterol, caloric balance, sufficient intake of fresh fruits, vegetables, and fiber. ? -Stressed the importance of regular exercise.  ? -Substance Abuse: Discussed cessation/primary prevention of tobacco, alcohol, or other drug use; driving or other dangerous activities under the influence; availability of treatment for abuse.  ? -Injury prevention: Discussed safety belts, safety helmets, smoke detector, smoking near bedding or upholstery.  ? -Sexuality: Discussed sexually transmitted diseases, partner selection, use of condoms, avoidance of unintended pregnancy and contraceptive alternatives.  ? -Dental health:  Discussed importance of regular tooth brushing, flossing, and dental visits. ? -Health maintenance and immunizations reviewed. Please refer to Health maintenance section. ? ?Return to care in 1 year for next preventative visit.  ? ?  ?Subjective:  ?HPI: ? ?He has no acute complaints today.  ? ?Patient here with sinus congestion. This has been going on for about 3-4 weeks.. He has tried Augmentin in the past. This has helped. No other treatment tried. Denies fever or chills. No reported cough or shortness of breath. Denies headache.  ? ?Lifestyle ?Diet: Balanced. ?Exercise: Goes to gym 3 days a week. ? ? ?  06/16/2021  ?  8:49 AM  ?Depression screen PHQ 2/9  ?Decreased Interest 0  ?Down, Depressed, Hopeless 1  ?PHQ - 2 Score 1  ? ? ?Health Maintenance Due  ?Topic Date Due  ? URINE MICROALBUMIN  04/26/2020  ? COVID-19 Vaccine (4 - Booster for Pfizer series) 05/12/2020  ?  ? ?ROS: Per HPI, otherwise a complete review of systems was negative.  ? ?PMH: ? ?The following were reviewed and entered/updated in epic: ?Past Medical History:  ?Diagnosis Date  ? Allergy   ? Anemia   ? Asthma   ? Atherosclerosis of abdominal aorta ([place]) 02/26/2020  ? Seen on x-ray L-spine September 2021  ? BPH (benign prostatic hyperplasia)   ? CAD (coronary artery disease)   ? a. s/p MI 09/1993;  b. known CTO of RCA;  c. 06/2014 Cath: LM short/mod dzs, LAD 95ost, LCX nl, RI nl, RCA 100, EF 55%;  c. 06/2014 s/p CABG x 4: LIMA->LAD, VG->D1, VG->OM1, VG->Acute Marginal.  ? Carotid stenosis   ? a. Carotid US 1/69:  RICA 45-03%; LICA 88-82% // Carotid US 1/22: R 1-39; L 40-59; L VA aberrant flow; bilat subclavian stenosis // Carotid  US 1/23: R 1-39, L 40-59; R subclavian stenosis  ? CKD (chronic kidney disease), stage III ([place])   ? Constipation   ? in the past 2-3 weeks  ? Diabetes ([place])   ? AODM  ? Diastolic dysfunction   ? a. 05/2015 Echo: EF 60-65%, LVH, Gr 1 DD, mild MR, mod dil RA/LA, mod TR, PASP 75mmHg.  ? Essential hypertension   ? GERD  (gastroesophageal reflux disease)   ? History of kidney stones   ? Hyperlipidemia   ? ?Patient Active Problem List  ? Diagnosis Date Noted  ? Lumbar radiculopathy 09/23/2020  ? Atherosclerosis of abdominal aorta ([place]) 02/26/2020  ? Status post placement of bone anchored hearing aid (BAHA) 05/09/2019  ? Asymmetric SNHL (sensorineural hearing loss) 04/02/2019  ? Iron deficiency anemia 10/22/2017  ? Uncontrolled type 2 diabetes mellitus with hyperglycemia, without long-term current use of insulin ([place]) 01/03/2017  ? PVD (posterior vitreous detachment), left eye 02/16/2016  ? Vitreous syneresis of right eye 02/16/2016  ? Diastolic dysfunction   ? Hypertension associated with diabetes ([place])   ? CKD stage 3 due to type 2 diabetes mellitus ([place])   ? S/P CABG x 4 05/21/2014  ? Bilateral dry eyes 01/20/2014  ? Nuclear cataract, bilateral 12/10/2013  ? Astigmatism 01/11/2012  ? Hypermetropia 01/11/2012  ? Presbyopia 01/11/2012  ? Carotid artery disease ([place]) 12/01/2007  ? BPH (benign prostatic hyperplasia) 12/01/2007  ? Dyslipidemia associated with type 2 diabetes mellitus ([place]) 11/28/2007  ? CAD (coronary artery disease) 11/28/2007  ? GERD 11/28/2007  ? ?Past Surgical History:  ?Procedure Laterality Date  ? ANGIOPLASTY  09/22/1993  ? POBA of RCA (Dr. Marella Chimes)  ? BACK SURGERY  2000  ? back fusion - Dr. Earnie Larsson  ? CARDIAC CATHETERIZATION  11/28/1998  ? "silent occlusion" of RCA w/collaterals from left coronary system, prox LAD w/40-50% eccentric narrowing, 1st diagonal with 70-80% eccentric narrowing, 85% narrowing of prox small OM1 (Dr. Marella Chimes)  ? CAROTID DOPPLER  07/2011  ? left subclavian (50-69%); right bulb (0-49%); RICA (normal); left mid-distal CCA (0-49%); left bulb/prox ICA (50-69%); left vertebral with abnormal antegrade flow  ? CORONARY ARTERY BYPASS GRAFT N/A 05/21/2014  ? Procedure: CORONARY ARTERY BYPASS GRAFTING (CABG) x  four, using left internal mammary artery and right leg greater saphenous vein  harvested endoscopically;  Surgeon: Melrose Nakayama, MD;  Location: [facility];  Service: Open Heart Surgery;  Laterality: N/A;  ? INTRAOPERATIVE TRANSESOPHAGEAL ECHOCARDIOGRAM N/A 05/21/2014  ? Procedure: INTRAOPERATIVE TRANSESOPHAGEAL ECHOCARDIOGRAM;  Surgeon: Melrose Nakayama, MD;  Location: [facility];  Service: Open Heart Surgery;  Laterality: N/A;  ? LEFT HEART CATHETERIZATION WITH CORONARY ANGIOGRAM N/A 05/19/2014  ? Procedure: LEFT HEART CATHETERIZATION WITH CORONARY ANGIOGRAM;  Surgeon: Blane Ohara, MD;  Location: [facility] CATH LAB;  Service: Cardiovascular;  Laterality: N/A;  ? LUMBAR LAMINECTOMY/DECOMPRESSION MICRODISCECTOMY Left 09/23/2020  ? Procedure: Laminectomy and Foraminotomy - left - Lumbar three-Lumbar four;  Surgeon: Earnie Larsson, MD;  Location: [facility];  Service: Neurosurgery;  Laterality: Left;  ? NASAL SINUS SURGERY  2010  ? NM MYOCAR PERF WALL MOTION  09/19/2009  ? bruce myoview - mild perfusion defect in basal inferior region (infarct/scar), EF 60%, low risk scan  ? RENAL DOPPLER  10/2011  ? SMA w/ 70-99% diameter reduction & high grade stenosis; R&L renals w/narrowing and increased velocities (60-99%), R kidney smaller than L  ? TRANSTHORACIC ECHOCARDIOGRAM  10/20/2012  ? EF 55-60%, moderate concentric hypertrophy, ventricular septum thickness increased, calcified  MV annulus  ? VASECTOMY  1963  ? ? ?Family History  ?Problem Relation Age of Onset  ? Uterine cancer Mother   ? Stroke Father   ? Diabetes Sister   ? Colon polyps Brother   ? Heart disease Brother   ? Colon cancer Neg Hx   ? Esophageal cancer Neg Hx   ? Rectal cancer Neg Hx   ? Stomach cancer Neg Hx   ? ? ?Medications- reviewed and updated ?Current Outpatient Medications  ?Medication Sig Dispense Refill  ? ACCU-CHEK GUIDE test strip TEST  BLOOD  SUGARS TWICE DAILY 200 strip 1  ? alfuzosin (UROXATRAL) 10 MG 24 hr tablet Take 10 mg by mouth daily with breakfast.    ? amLODipine (NORVASC) 10 MG tablet TAKE 1 TABLET EVERY DAY 90 tablet 0  ?  amoxicillin-clavulanate (AUGMENTIN) 875-125 MG tablet Take 1 tablet by mouth 2 (two) times daily. 20 tablet 0  ? Ascorbic Acid (VITAMIN C) 1000 MG tablet Take 1,000 mg by mouth daily.    ? aspirin 81 MG tablet Take 81 m

## 2021-09-19 NOTE — Assessment & Plan Note (Signed)
Check lipids.  He is on simvastatin 40 mg daily. 

## 2021-09-19 NOTE — Assessment & Plan Note (Signed)
Above goal with isolated systolic hypertension today.  Continue management per cardiology with amlodipine 10 mg daily and hydralazine 50 mg 3 times daily. ?

## 2021-09-19 NOTE — Assessment & Plan Note (Signed)
Check iron panel and CBC. ?

## 2021-09-19 NOTE — Assessment & Plan Note (Signed)
Continue management per nephrology.  We will check c-Met ?

## 2021-09-20 DIAGNOSIS — H35351 Cystoid macular degeneration, right eye: Secondary | ICD-10-CM | POA: Diagnosis not present

## 2021-09-20 NOTE — Progress Notes (Signed)
Please inform patient of the following: ? ?Labs are all stable. Do not need to make any changes to his treatment plan at this time. We can recheck in 1 year. ? ?[name]. [name], MD ?09/20/2021 8:04 AM  ?

## 2021-09-29 IMAGING — US US RENAL
1 series · 14 of 25 positions shown · non-contrast
Comparison: 09/17/2018 renal ultrasound. 04/13/2015 CT abdomen and
pelvis.

CLINICAL DATA: Renal mass.

EXAM:
RENAL / URINARY TRACT ULTRASOUND COMPLETE

[Series 1: us renal · 0.26mm/px · 14 of 46 slices shown]
[im 1/46]
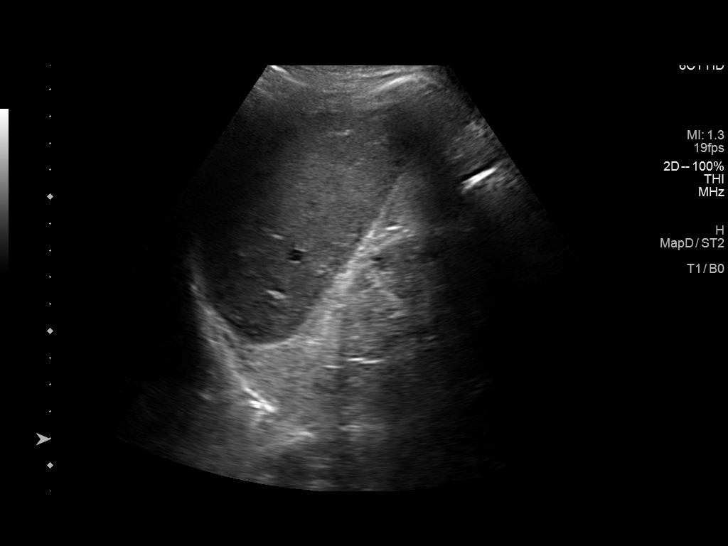
[im 4/46]
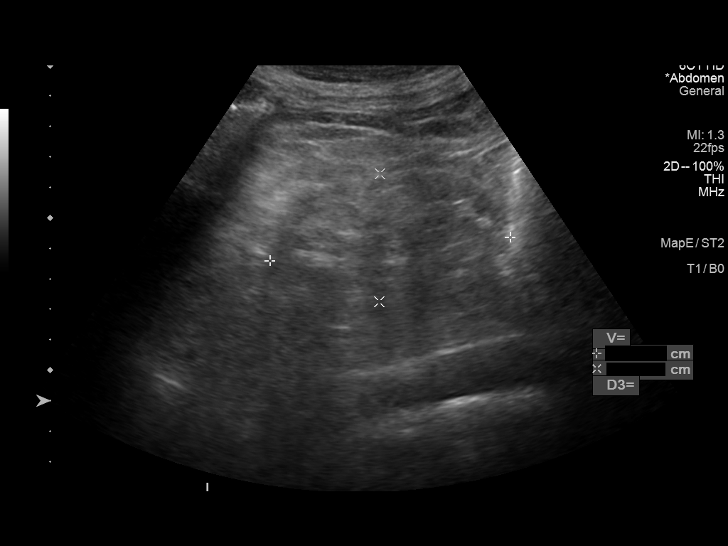
[im 8/46]
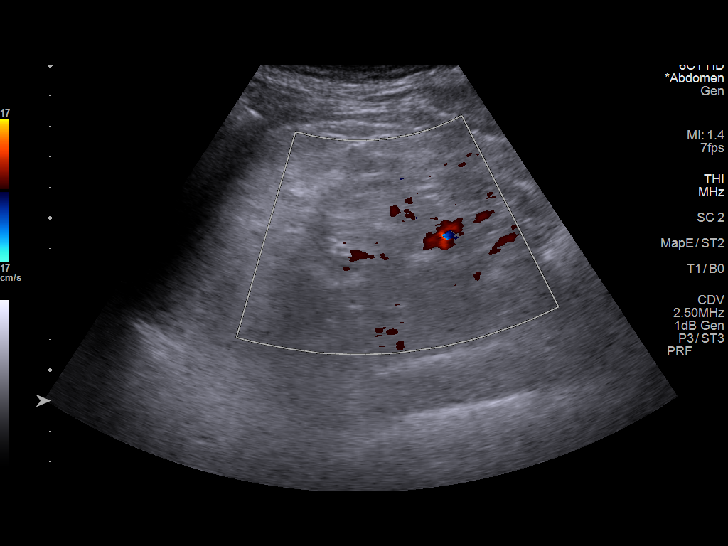
[im 12/46]
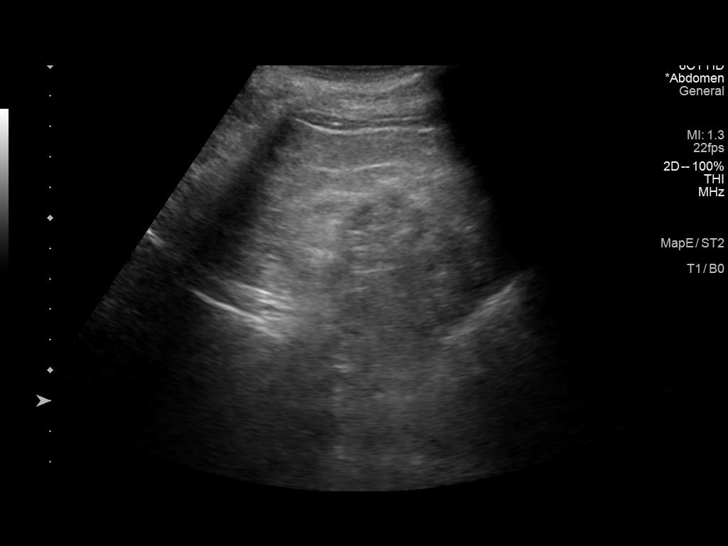
[im 16/46]
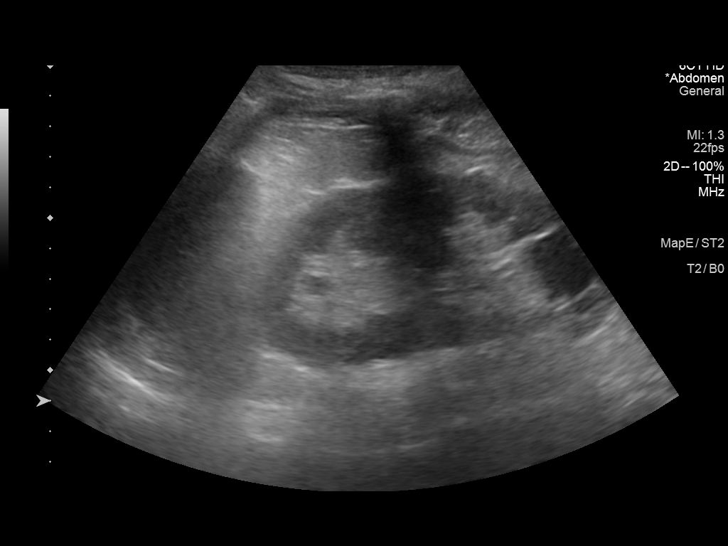
[im 17/46]
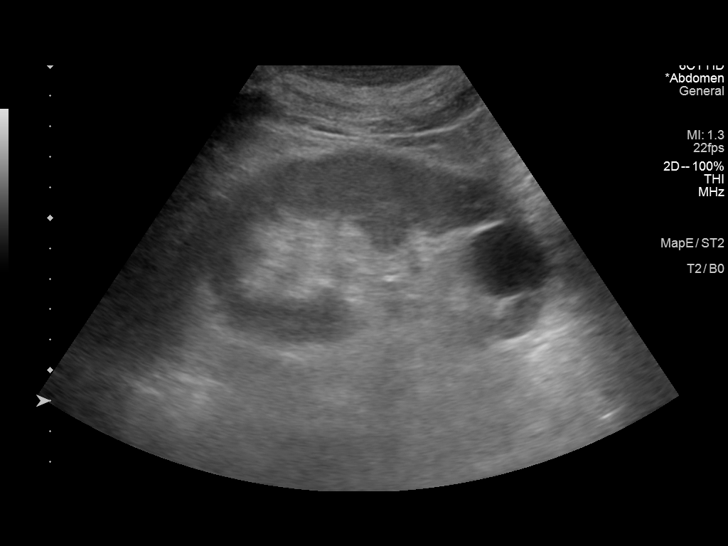
[im 21/46]
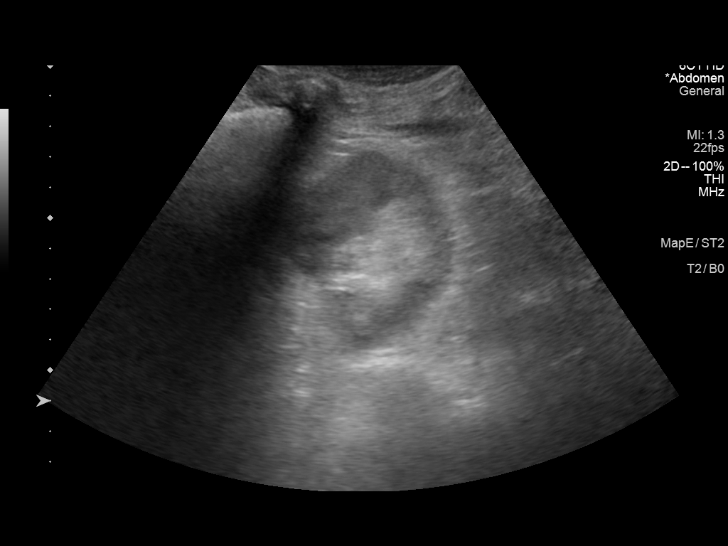
[im 25/46]
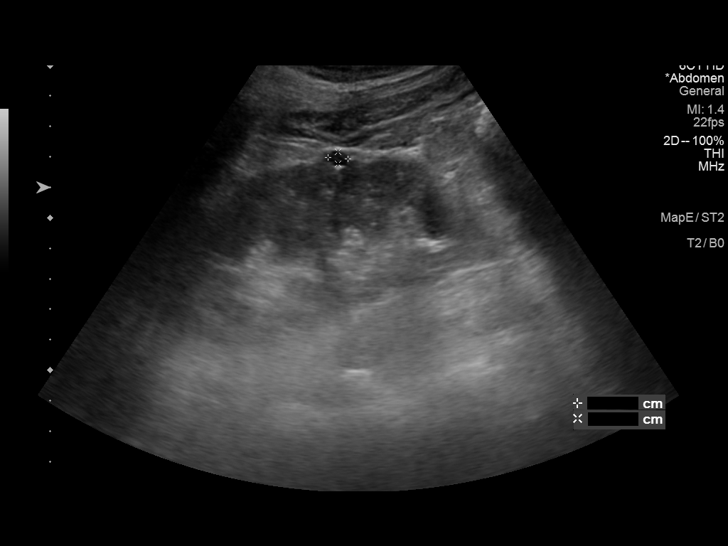
[im 29/46]
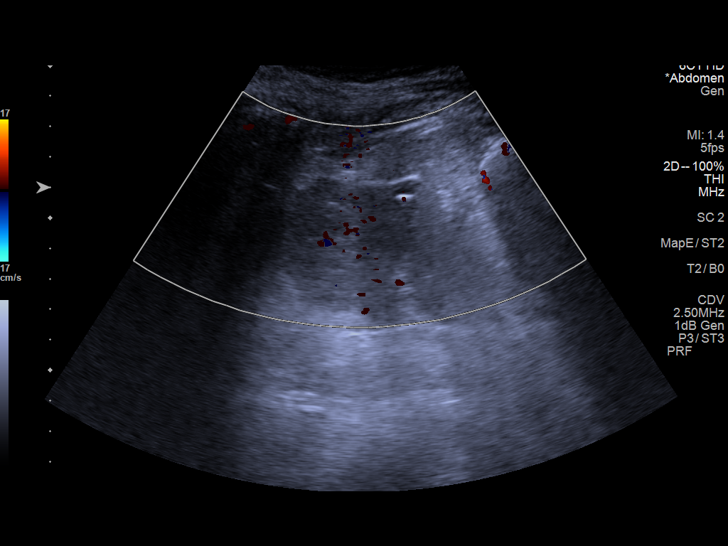
[im 31/46]
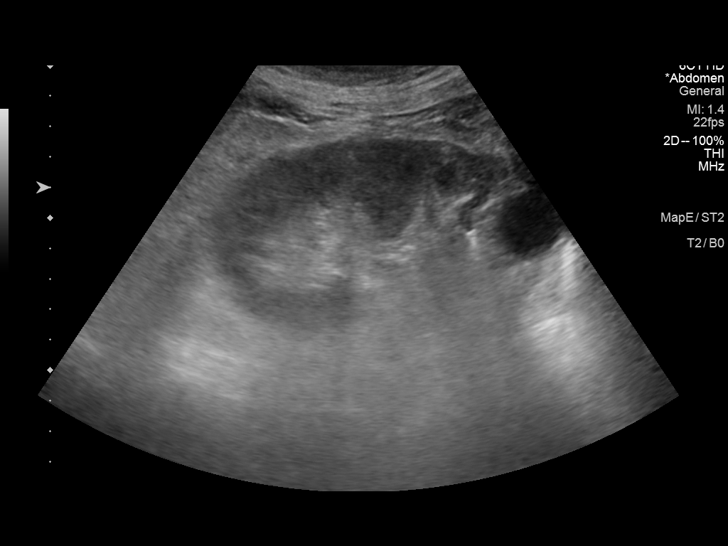
[im 34/46]
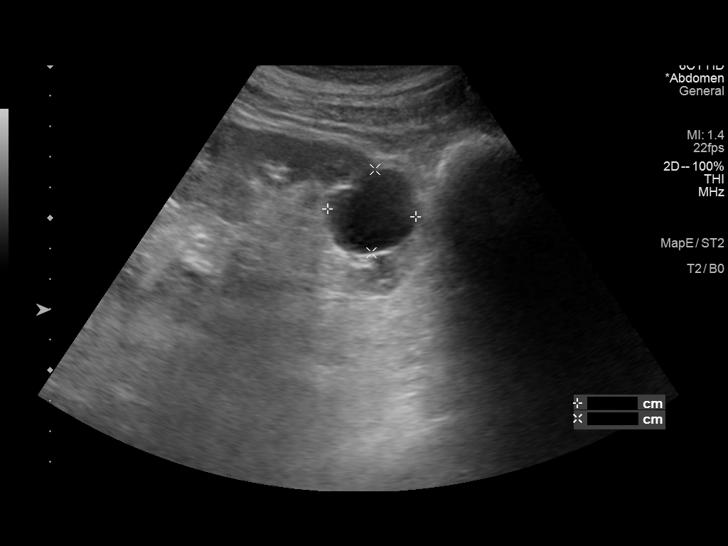
[im 38/46]
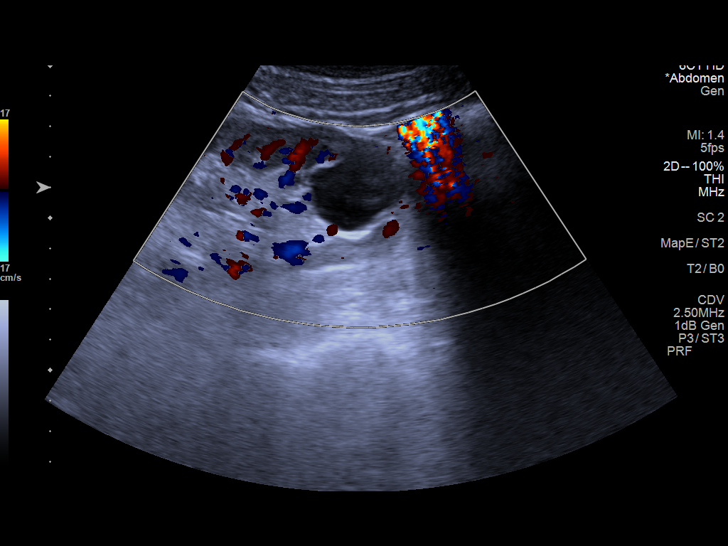
[im 42/46]
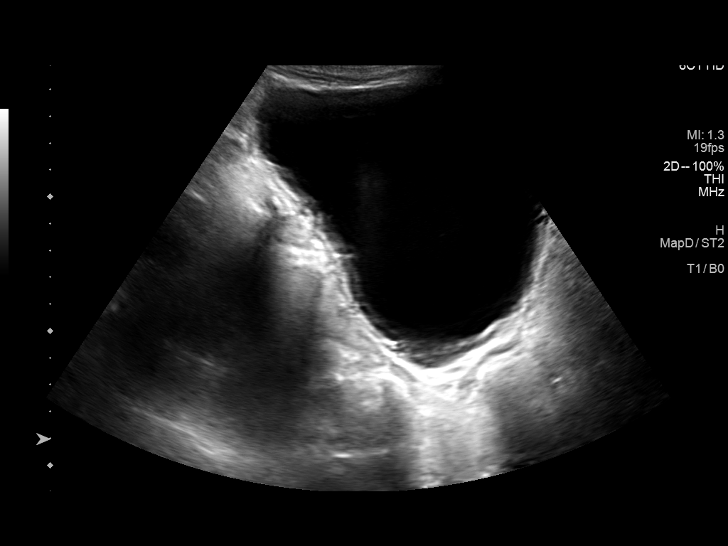
[im 46/46]
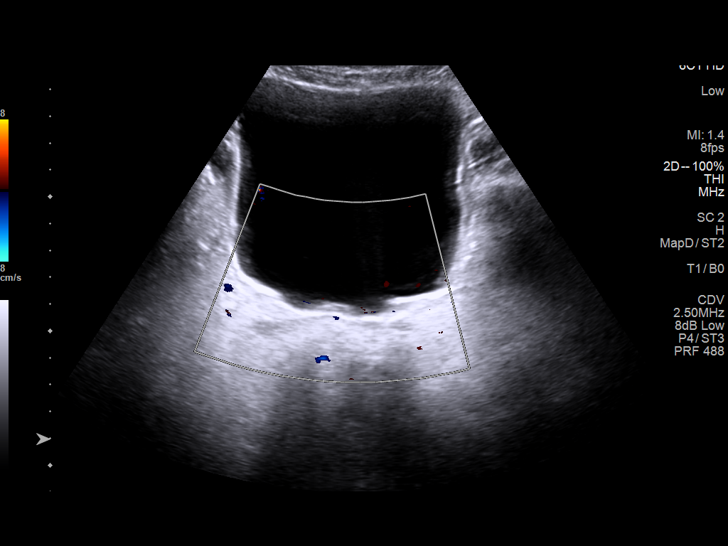

[14 of 25 positions shown; findings below may reference images not displayed]

FINDINGS: Right Kidney:

Renal measurements: 7.9 x 4.2 x 5.3 cm = volume: 92 mL. Suboptimally
visualized. Chronically increased parenchymal echogenicity without
hydronephrosis.

Left Kidney:

Renal measurements: 12.0 x 6.4 x 5.7 cm = volume: 228 mL.
Echogenicity within normal limits. No solid mass or hydronephrosis.
2.9 cm lower pole cyst (2.3 cm on the 5252 ultrasound). 0.7 cm
interpolar cyst.

Bladder:

Appears normal for degree of bladder distention.

Other:

None.
IMPRESSION: 1. Mildly increased size of the dominant left renal cyst since 5252.
No suspicious renal mass.
2. Chronically atrophic right kidney.

## 2021-10-13 DIAGNOSIS — Z794 Long term (current) use of insulin: Secondary | ICD-10-CM | POA: Diagnosis not present

## 2021-10-13 DIAGNOSIS — Z79899 Other long term (current) drug therapy: Secondary | ICD-10-CM | POA: Diagnosis not present

## 2021-10-13 DIAGNOSIS — H90A32 Mixed conductive and sensorineural hearing loss, unilateral, left ear with restricted hearing on the contralateral side: Secondary | ICD-10-CM | POA: Diagnosis not present

## 2021-10-13 DIAGNOSIS — H903 Sensorineural hearing loss, bilateral: Secondary | ICD-10-CM | POA: Diagnosis not present

## 2021-10-13 DIAGNOSIS — E119 Type 2 diabetes mellitus without complications: Secondary | ICD-10-CM | POA: Diagnosis not present

## 2021-10-13 DIAGNOSIS — S0100XA Unspecified open wound of scalp, initial encounter: Secondary | ICD-10-CM | POA: Diagnosis not present

## 2021-10-13 DIAGNOSIS — Z9621 Cochlear implant status: Secondary | ICD-10-CM | POA: Diagnosis not present

## 2021-10-13 DIAGNOSIS — Z7982 Long term (current) use of aspirin: Secondary | ICD-10-CM | POA: Diagnosis not present

## 2021-10-15 ENCOUNTER — Other Ambulatory Visit: Payer: Self-pay | Admitting: Endocrinology

## 2021-10-17 ENCOUNTER — Ambulatory Visit: Payer: Medicare HMO | Admitting: Endocrinology

## 2021-10-17 ENCOUNTER — Other Ambulatory Visit: Payer: Self-pay | Admitting: Cardiovascular Disease

## 2021-10-17 DIAGNOSIS — S0100XA Unspecified open wound of scalp, initial encounter: Secondary | ICD-10-CM | POA: Diagnosis not present

## 2021-10-17 DIAGNOSIS — E119 Type 2 diabetes mellitus without complications: Secondary | ICD-10-CM | POA: Diagnosis not present

## 2021-10-17 DIAGNOSIS — H9121 Sudden idiopathic hearing loss, right ear: Secondary | ICD-10-CM | POA: Diagnosis not present

## 2021-10-17 DIAGNOSIS — Z87891 Personal history of nicotine dependence: Secondary | ICD-10-CM | POA: Diagnosis not present

## 2021-10-17 DIAGNOSIS — Z794 Long term (current) use of insulin: Secondary | ICD-10-CM | POA: Diagnosis not present

## 2021-10-17 DIAGNOSIS — H906 Mixed conductive and sensorineural hearing loss, bilateral: Secondary | ICD-10-CM | POA: Diagnosis not present

## 2021-10-17 DIAGNOSIS — T8579XA Infection and inflammatory reaction due to other internal prosthetic devices, implants and grafts, initial encounter: Secondary | ICD-10-CM | POA: Diagnosis not present

## 2021-10-18 DIAGNOSIS — H35351 Cystoid macular degeneration, right eye: Secondary | ICD-10-CM | POA: Diagnosis not present

## 2021-10-20 DIAGNOSIS — Z48810 Encounter for surgical aftercare following surgery on the sense organs: Secondary | ICD-10-CM | POA: Diagnosis not present

## 2021-10-23 DIAGNOSIS — R69 Illness, unspecified: Secondary | ICD-10-CM | POA: Diagnosis not present

## 2021-10-27 DIAGNOSIS — Z48810 Encounter for surgical aftercare following surgery on the sense organs: Secondary | ICD-10-CM | POA: Diagnosis not present

## 2021-10-27 DIAGNOSIS — H90A22 Sensorineural hearing loss, unilateral, left ear, with restricted hearing on the contralateral side: Secondary | ICD-10-CM | POA: Diagnosis not present

## 2021-10-27 DIAGNOSIS — Z4802 Encounter for removal of sutures: Secondary | ICD-10-CM | POA: Diagnosis not present

## 2021-10-27 DIAGNOSIS — H903 Sensorineural hearing loss, bilateral: Secondary | ICD-10-CM | POA: Diagnosis not present

## 2021-10-27 DIAGNOSIS — E1151 Type 2 diabetes mellitus with diabetic peripheral angiopathy without gangrene: Secondary | ICD-10-CM | POA: Diagnosis not present

## 2021-10-27 DIAGNOSIS — T8579XD Infection and inflammatory reaction due to other internal prosthetic devices, implants and grafts, subsequent encounter: Secondary | ICD-10-CM | POA: Diagnosis not present

## 2021-10-27 DIAGNOSIS — Z792 Long term (current) use of antibiotics: Secondary | ICD-10-CM | POA: Diagnosis not present

## 2021-10-27 DIAGNOSIS — E1122 Type 2 diabetes mellitus with diabetic chronic kidney disease: Secondary | ICD-10-CM | POA: Diagnosis not present

## 2021-10-27 DIAGNOSIS — H90A32 Mixed conductive and sensorineural hearing loss, unilateral, left ear with restricted hearing on the contralateral side: Secondary | ICD-10-CM | POA: Diagnosis not present

## 2021-11-01 ENCOUNTER — Other Ambulatory Visit: Payer: Self-pay | Admitting: Cardiovascular Disease

## 2021-11-08 ENCOUNTER — Ambulatory Visit (INDEPENDENT_AMBULATORY_CARE_PROVIDER_SITE_OTHER): Payer: Medicare HMO | Admitting: Endocrinology

## 2021-11-08 ENCOUNTER — Encounter: Payer: Self-pay | Admitting: Endocrinology

## 2021-11-08 VITALS — BP 146/66 | HR 56 | Ht 69.0 in | Wt 162.8 lb

## 2021-11-08 DIAGNOSIS — Z961 Presence of intraocular lens: Secondary | ICD-10-CM | POA: Diagnosis not present

## 2021-11-08 DIAGNOSIS — E1165 Type 2 diabetes mellitus with hyperglycemia: Secondary | ICD-10-CM | POA: Diagnosis not present

## 2021-11-08 DIAGNOSIS — Z794 Long term (current) use of insulin: Secondary | ICD-10-CM | POA: Diagnosis not present

## 2021-11-08 LAB — POCT GLYCOSYLATED HEMOGLOBIN (HGB A1C): Hemoglobin A1C: 6.7 % — AB (ref 4.0–5.6)

## 2021-11-08 NOTE — Progress Notes (Addendum)
Patient ID: Justin Salinas, male   DOB: Jan 04, 1941, 81 y.o.   MRN: [id number]           Reason for Appointment: Follow-up for Type 2 Diabetes    History of Present Illness:          Date of diagnosis of type 2 diabetes mellitus:2000        Background history:   His diabetes had been mild initially and treated with metformin He had subsequently other medications added including Amaryl and Januvia No details are available from PCP office, [name] in 2015 was 7.6  Because of his poor control was started on insulin on 01/03/17. He has been on Actos since 06/2017+ He had a baseline A1c A1c 9.7 on his initial consultation in 01/2017  Recent history:   Insulin regimen: NovoLog mix, 7-8 units at breakfast and 8 at supper Prandin 1 mg at breakfast and lunch daily  A1c is 6.7 compared to 7 previously  Last fructosamine was 340, usually over 320  Current management, blood sugar patterns and problems identified:   He has difficulty remembering instructions and forgets to change his insulin regimen He is still taking mostly 8 units morning and evening which he takes after eating Not followed instructions for taking insulin 3 times a day and forgets to make the changes  Also taking Prandin after eating instead of before Also despite repeated reminders he only checks his blood sugars before breakfast and dinnertime  At lunchtime he will frequently eat sandwiches or hamburgers from [employer] but occasionally will just eat beans He was told to take 6 units of insulin at lunch but he has not done so FASTING blood sugars recently are mostly in the low 100 range RANGE 108-132 Blood sugars before dinnertime are quite variable with occasional readings over 200, RANGE 137-257 No hypoglycemic symptoms after dinner or overnight recently He is going to the gym for exercise twice a week and doing significant amount of aerobic activity Also at home he is usually fairly active with yard work His  weight is likely fluctuating based on his fluid status but is down    Side effects from medications have been: None  Compliance with the medical regimen: Fair  Glucose monitoring:  done 2 times a day         Glucometer:  Accu-Chek guide me .      Blood Glucose readings by monitor review  AVERAGE 156 overall, blood sugars as above  Previously:  PRE-MEAL Fasting Lunch Dinner Bedtime Overall  Glucose range: 89-145  141-274    Mean/median:     145   POST-MEAL PC Breakfast PC Lunch PC Dinner  Glucose range:     Mean/median:         Self-care: The diet that the patient has been following is: tries to limit sweets, fried food       Typical meal intake: Breakfast is eggs,  Oatmeal/ meat, toast.  Lunch: Sandwich or.Crackers with or without peanut butter usually                Dietician visit, most recent: 03/2017   Weight history:   Wt Readings from Last 3 Encounters:  11/08/21 162 lb 12.8 oz (73.8 kg)  09/19/21 163 lb (73.9 kg)  08/11/21 170 lb 12.8 oz (77.5 kg)    Glycemic control:   Lab Results  Component Value Date   HGBA1C 6.7 (A) 11/08/2021   HGBA1C 7.0 (A) 06/06/2021   HGBA1C 6.2 (A) 02/28/2021  Lab Results  Component Value Date   MICROALBUR <0.7 09/19/2021   LDLCALC 62 09/19/2021   CREATININE 1.59 (H) 09/19/2021   Lab Results  Component Value Date   MICRALBCREAT 1.5 09/19/2021   Microalbumin ratio 9 in 12/2015  Lab Results  Component Value Date   FRUCTOSAMINE 340 (H) 06/21/2020   FRUCTOSAMINE 340 (H) 02/23/2020   FRUCTOSAMINE 352 (H) 09/21/2019      Allergies as of 11/08/2021       Reactions   Exenatide Other (See Comments)   Linagliptin Other (See Comments)   Lasix [furosemide] Other (See Comments)   Constipation        Medication List        Accurate as of November 08, 2021  8:52 PM. If you have any questions, ask your nurse or doctor.          STOP taking these medications    hydrALAZINE 50 MG tablet Commonly known as:  APRESOLINE Stopped by: Elayne Snare, MD       TAKE these medications    Accu-Chek Softclix Lancet Dev Kit   alfuzosin 10 MG 24 hr tablet Commonly known as: UROXATRAL Take 10 mg by mouth daily with breakfast.   amLODipine 10 MG tablet Commonly known as: NORVASC TAKE 1 TABLET EVERY DAY   amoxicillin-clavulanate 875-125 MG tablet Commonly known as: AUGMENTIN Take 1 tablet by mouth 2 (two) times daily.   aspirin 81 MG tablet Take 81 mg by mouth daily.   azelastine 0.1 % nasal spray Commonly known as: ASTELIN Place 2 sprays into both nostrils 2 (two) times daily.   CENTRUM SILVER ADULT 50+ PO Take by mouth.   Droplet Pen Needles 31G X 5 MM Misc Generic drug: Insulin Pen Needle USE AS DIRECTED TWICE DAILY   furosemide 40 MG tablet Commonly known as: LASIX Take 40 mg by mouth 2 (two) times daily.   Glucosamine 1500 Complex Caps Take by mouth.   glucose blood test strip Commonly known as: Accu-Chek Guide Use to check blood sugars 2 times daily   Accu-Chek Guide test strip Generic drug: glucose blood TEST  BLOOD  SUGARS TWICE DAILY   NovoLOG Mix 70/30 FlexPen (70-30) 100 UNIT/ML FlexPen Generic drug: insulin aspart protamine - aspart INJECT 8 UNITS UNDER THE SKIN BEFORE BREAKFAST AND 8 UNITS AT DINNER. DISCARD PEN 14 DAYS AFTER OPENING   Omega-3 1400 MG Caps Take 1,400 mg by mouth daily.   Pfizer COVID-19 Vac Bivalent injection Generic drug: COVID-19 mRNA bivalent vaccine (Pfizer)   pregabalin 25 MG capsule Commonly known as: Lyrica Take 1 capsule (25 mg total) by mouth 2 (two) times daily.   repaglinide 1 MG tablet Commonly known as: PRANDIN TAKE ONE TABLET BEFORE BREAKFAST AND LUNCH   simvastatin 40 MG tablet Commonly known as: ZOCOR Take 1 tablet (40 mg total) by mouth daily. Appointment needed with cardiologist for further refills   TRUEplus Lancets 28G Misc TEST BLOOD SUGAR TWICE DAILY   vitamin C 1000 MG tablet Take 1,000 mg by mouth daily.         Allergies:  Allergies  Allergen Reactions   Exenatide Other (See Comments)   Linagliptin Other (See Comments)   Lasix [Furosemide] Other (See Comments)    Constipation    Past Medical History:  Diagnosis Date   Allergy    Anemia    Asthma    Atherosclerosis of abdominal aorta ([place]) 02/26/2020   Seen on x-ray L-spine September 2021   BPH (benign prostatic hyperplasia)  CAD (coronary artery disease)    a. s/p MI 09/1993;  b. known CTO of RCA;  c. 06/2014 Cath: LM short/mod dzs, LAD 95ost, LCX nl, RI nl, RCA 100, EF 55%;  c. 06/2014 s/p CABG x 4: LIMA->LAD, VG->D1, VG->OM1, VG->Acute Marginal.   Carotid stenosis    a. Carotid US 0/94:  RICA 70-96%; LICA 28-36% // Carotid US 1/22: R 1-39; L 40-59; L VA aberrant flow; bilat subclavian stenosis // Carotid US 1/23: R 1-39, L 40-59; R subclavian stenosis   CKD (chronic kidney disease), stage III (HCC)    Constipation    in the past 2-3 weeks   Diabetes ([place])    AODM   Diastolic dysfunction    a. 05/2015 Echo: EF 60-65%, LVH, Gr 1 DD, mild MR, mod dil RA/LA, mod TR, PASP 81mHg.   Essential hypertension    GERD (gastroesophageal reflux disease)    History of kidney stones    Hyperlipidemia     Past Surgical History:  Procedure Laterality Date   ANGIOPLASTY  09/22/1993   POBA of RCA (Dr. RMarella Chimes   [facility]  back fusion - Dr. HEarnie Larsson  CARDIAC CATHETERIZATION  11/28/1998   "silent occlusion" of RCA w/collaterals from left coronary system, prox LAD w/40-50% eccentric narrowing, 1st diagonal with 70-80% eccentric narrowing, 85% narrowing of prox small OM1 (Dr. RMarella Chimes   CAROTID DOPPLER  07/2011   left subclavian (50-69%); right bulb (0-49%); RICA (normal); left mid-distal CCA (0-49%); left bulb/prox ICA (50-69%); left vertebral with abnormal antegrade flow   CORONARY ARTERY BYPASS GRAFT N/A 05/21/2014   Procedure: CORONARY ARTERY BYPASS GRAFTING (CABG) x  four, using left internal mammary artery and  right leg greater saphenous vein harvested endoscopically;  Surgeon: SMelrose Nakayama MD;  [facility] [facility]  Service: Open Heart Surgery;  Laterality: N/A;   INTRAOPERATIVE TRANSESOPHAGEAL ECHOCARDIOGRAM N/A 05/21/2014   Procedure: INTRAOPERATIVE TRANSESOPHAGEAL ECHOCARDIOGRAM;  Surgeon: SMelrose Nakayama MD;  [place] [facility]  Service: Open Heart Surgery;  Laterality: N/A;   LEFT HEART CATHETERIZATION WITH CORONARY ANGIOGRAM N/A 05/19/2014   Procedure: LEFT HEART CATHETERIZATION WITH CORONARY ANGIOGRAM;  Surgeon: MBlane Ohara MD;  [facility];  Service: Cardiovascular;  Laterality: N/A;   LUMBAR LAMINECTOMY/DECOMPRESSION MICRODISCECTOMY Left 09/23/2020   Procedure: Laminectomy and Foraminotomy - left - Lumbar three-Lumbar four;  Surgeon: PEarnie Larsson MD;  [facility]  Service: Neurosurgery;  Laterality: Left;   NASAL SINUS SURGERY  2010   NM MYOCAR PERF WALL MOTION  09/19/2009   bruce myoview - mild perfusion defect in basal inferior region (infarct/scar), EF 60%, low risk scan   RENAL DOPPLER  10/2011   SMA w/ 70-99% diameter reduction & high grade stenosis; R&L renals w/narrowing and increased velocities (60-99%), R kidney smaller than L   TRANSTHORACIC ECHOCARDIOGRAM  10/20/2012   EF 55-60%, moderate concentric hypertrophy, ventricular septum thickness increased, calcified MV annulus   VASECTOMY  1963    Family History  Problem Relation Age of Onset   Uterine cancer Mother    Stroke Father    Diabetes Sister    Colon polyps Brother    Heart disease Brother    Colon cancer Neg Hx    Esophageal cancer Neg Hx    Rectal cancer Neg Hx    Stomach cancer Neg Hx     Social History:  reports that he quit smoking about 49 years ago. His smoking use included cigarettes. His smokeless tobacco use  includes chew. He reports current alcohol use of about 3.0 standard drinks per week. He reports that he does not use drugs.   Review of Systems   Lipid history: Taking  simvastatin 40 mg, most recent LDL as follows This is prescribed by cardiologist  Last LDL in 2/22 was 45   Lab Results  Component Value Date   CHOL 149 09/19/2021   HDL 74.70 09/19/2021   LDLCALC 62 09/19/2021   LDLDIRECT 45.0 04/17/2017   TRIG 61.0 09/19/2021   CHOLHDL 2 09/19/2021           Hypertension: Blood pressure is treated with hydralazine, losartan 50 and he is also on Lasix and amlodipine 10 mg Followed by nephrologist Blood pressure on initial exam was much higher  BP Readings from Last 3 Encounters:  11/08/21 (!) 146/66  09/19/21 (!) 183/57  08/11/21 (!) 164/62   CKD: Renal function has been as follows  Has been taking Lasix 40 mg for edema, followed by nephrologist every 3 months  Lab Results  Component Value Date   CREATININE 1.59 (H) 09/19/2021   CREATININE 1.7 (A) 07/20/2021   CREATININE 2.0 (A) 04/21/2021     Most recent eye exam was 1/21  Most recent foot exam: 02/2021   LABS:  Office Visit on 11/08/2021  Component Date Value Ref Range Status   Hemoglobin A1C 11/08/2021 6.7 (A)  4.0 - 5.6 % Final    Physical Examination:  BP (!) 146/66 (BP Location: Left Arm, Patient Position: Sitting, Cuff Size: Normal)   Pulse (!) 56   Ht 5' 9" (1.753 m)   Wt 162 lb 12.8 oz (73.8 kg)   SpO2 97%   BMI 24.04 kg/m     No ankle edema present  ASSESSMENT:  Diabetes type 2, insulin requiring  See history of present illness for discussion of current diabetes management, blood sugar patterns and problems identified  He is on a regimen of Prandin and low-dose premixed insulin twice a day  [name] is 7% last  He tends to have postprandial hyperglycemia and blood sugars as high as 274 before dinner Likely has consistently high readings after lunch Has difficulty remembering his instructions for taking insulin 3 times a day as discussed above  Renal dysfunction: Recently improved and followed by nephrologist  PLAN:    His new insulin regimen and  instructions on timing of insulin and Prandin were again written in detail on his AVS and this was presented to him and highlighted fashion Asked him to put the insulin doses on the refrigerator and follow directions as recommended Check blood sugars after meals occasionally He can skip the lunchtime dose if only eating beings or snacks and no full meal  INSULIN doses: Take 8 units BEFORE breakfast WITH REPAGLANIDE  Take 5 at LUNCH IF EATING BREAD WITH REPAGLANIDE   Take 6 units before supper   Check blood sugars on waking up 3-4 days a week   Also check blood sugars about 2 hours after meals and do this after different meals by rotation   Recommended blood sugar levels on waking up are 90-130 and about 2 hours after meal is 130-180    Reminded him to call if he has any unusually high or low readings  Patient Instructions  INSULIN Take 8 units BEFORE breakfast WITH REPAGLANIDE  Take 5 at LUNCH IF EATING BREAD WITH REPAGLANIDE   Take 6 units before supper   Check blood sugars on waking up 3-4 days a week  Also check blood sugars about 2 hours after meals and do this after different meals by rotation   Recommended blood sugar levels on waking up are 90-130 and about 2 hours after meal is 130-180      Elayne Snare 11/08/2021, 8:52 PM   Note: This office note was prepared with Dragon voice recognition system technology. Any transcriptional errors that result from this process are unintentional.

## 2021-11-08 NOTE — Patient Instructions (Addendum)
INSULIN Take 8 units BEFORE breakfast WITH REPAGLANIDE  Take 5 at LUNCH IF EATING BREAD WITH REPAGLANIDE   Take 6 units before supper   Check blood sugars on waking up 3-4 days a week   Also check blood sugars about 2 hours after meals and do this after different meals by rotation   Recommended blood sugar levels on waking up are 90-130 and about 2 hours after meal is 130-180

## 2021-11-16 DIAGNOSIS — E119 Type 2 diabetes mellitus without complications: Secondary | ICD-10-CM | POA: Diagnosis not present

## 2021-11-16 DIAGNOSIS — H90A31 Mixed conductive and sensorineural hearing loss, unilateral, right ear with restricted hearing on the contralateral side: Secondary | ICD-10-CM | POA: Diagnosis not present

## 2021-11-16 DIAGNOSIS — H90A22 Sensorineural hearing loss, unilateral, left ear, with restricted hearing on the contralateral side: Secondary | ICD-10-CM | POA: Diagnosis not present

## 2021-11-16 DIAGNOSIS — Z570 Occupational exposure to noise: Secondary | ICD-10-CM | POA: Diagnosis not present

## 2021-11-22 ENCOUNTER — Other Ambulatory Visit: Payer: Self-pay | Admitting: Cardiovascular Disease

## 2021-12-08 ENCOUNTER — Other Ambulatory Visit: Payer: Self-pay | Admitting: Cardiovascular Disease

## 2021-12-17 ENCOUNTER — Other Ambulatory Visit: Payer: Self-pay | Admitting: Endocrinology

## 2021-12-17 ENCOUNTER — Other Ambulatory Visit: Payer: Self-pay | Admitting: Cardiovascular Disease

## 2021-12-25 DIAGNOSIS — R69 Illness, unspecified: Secondary | ICD-10-CM | POA: Diagnosis not present

## 2022-01-01 DIAGNOSIS — N401 Enlarged prostate with lower urinary tract symptoms: Secondary | ICD-10-CM | POA: Diagnosis not present

## 2022-01-01 DIAGNOSIS — R351 Nocturia: Secondary | ICD-10-CM | POA: Diagnosis not present

## 2022-01-03 ENCOUNTER — Encounter: Payer: Self-pay | Admitting: *Deleted

## 2022-01-03 ENCOUNTER — Other Ambulatory Visit: Payer: Self-pay | Admitting: *Deleted

## 2022-01-03 NOTE — Patient Outreach (Signed)
  Care Coordination   Initial Visit Note   01/03/2022 Name: Justin Salinas MRN: [id number] DOB: [birth year]  Justin Salinas is a 81 y.o. year old male who sees Vivi Barrack, MD for primary care. I spoke with  Justin Salinas by phone today  What matters to the patients health and wellness today?  Medication issue    Goals Addressed               This Visit's Progress     Not satisfied with BP medication (Amlodipine) (pt-stated)        Care Coordination Interventions: Provided education on importance of blood pressure control in management of CAD Provided education on Importance of limiting foods high in cholesterol Reviewed Importance of taking all medications as prescribed Reviewed Importance of attending all scheduled provider appointments Advised patient to discuss Dr. [name] with provider Screening for signs and symptoms of depression related to chronic disease state Assessed social determinant of health barriers RN case manager will communicate with PCP concerning pt's medication issue  (Amlodipine)         SDOH assessments and interventions completed:  Yes  SDOH Interventions Today    Flowsheet Row Most Recent Value  SDOH Interventions   Food Insecurity Interventions Intervention Not Indicated  Housing Interventions Intervention Not Indicated  Transportation Interventions Intervention Not Indicated        Care Coordination Interventions Activated:  Yes  Care Coordination Interventions:  Yes, provided   Follow up plan: Follow up call scheduled for 02/08/2022 @ 10:00 AM    Encounter Outcome:  Pt. Visit Completed   Raina Mina, RN Care Management Coordinator [place] Office [phone]

## 2022-01-03 NOTE — Patient Instructions (Signed)
Visit Information  Thank you for taking time to visit with me today. Please don't hesitate to contact me if I can be of assistance to you before our next scheduled telephone appointment.  Following are the goals we discussed today:  Provided education on importance of blood pressure control in management of CAD Provided education on Importance of limiting foods high in cholesterol Reviewed Importance of taking all medications as prescribed Reviewed Importance of attending all scheduled provider appointments Advised patient to discuss Dr. [name] with provider Screening for signs and symptoms of depression related to chronic disease state Assessed social determinant of health barriers RN case manager will communicate with PCP concerning pt's medication issue (Amlodipine)  Our next appointment is by telephone on 02/08/2022 at 10:00 AM  Please call the care guide team at [phone] if you need to cancel or reschedule your appointment.   Please call the Suicide and Crisis Lifeline: 988 call the [place] National Suicide Prevention Lifeline: 314-651-7197 or TTY: (858)535-4950 TTY (442)796-7622) to talk to a trained counselor call 1-800-273-TALK (toll free, 24 hour hotline) if you are experiencing a Mental Health or [place] or need someone to talk to.  Patient verbalizes understanding of instructions and care plan provided today and agrees to view in [place]. Active MyChart status and patient understanding of how to access instructions and care plan via MyChart confirmed with patient.     Telephone follow up appointment with care management team member scheduled for: The patient has been provided with contact information for the care management team and has been advised to call with any health related questions or concerns.   Raina Mina, RN Care Management Coordinator [place] Office [phone]

## 2022-01-09 DIAGNOSIS — H269 Unspecified cataract: Secondary | ICD-10-CM | POA: Diagnosis not present

## 2022-01-09 DIAGNOSIS — H25012 Cortical age-related cataract, left eye: Secondary | ICD-10-CM | POA: Diagnosis not present

## 2022-01-09 DIAGNOSIS — H2512 Age-related nuclear cataract, left eye: Secondary | ICD-10-CM | POA: Diagnosis not present

## 2022-01-09 DIAGNOSIS — H25812 Combined forms of age-related cataract, left eye: Secondary | ICD-10-CM | POA: Diagnosis not present

## 2022-01-25 DIAGNOSIS — N189 Chronic kidney disease, unspecified: Secondary | ICD-10-CM | POA: Diagnosis not present

## 2022-01-25 DIAGNOSIS — N400 Enlarged prostate without lower urinary tract symptoms: Secondary | ICD-10-CM | POA: Diagnosis not present

## 2022-01-25 DIAGNOSIS — N183 Chronic kidney disease, stage 3 unspecified: Secondary | ICD-10-CM | POA: Diagnosis not present

## 2022-01-25 DIAGNOSIS — D631 Anemia in chronic kidney disease: Secondary | ICD-10-CM | POA: Diagnosis not present

## 2022-01-25 DIAGNOSIS — N2889 Other specified disorders of kidney and ureter: Secondary | ICD-10-CM | POA: Diagnosis not present

## 2022-01-25 DIAGNOSIS — E1122 Type 2 diabetes mellitus with diabetic chronic kidney disease: Secondary | ICD-10-CM | POA: Diagnosis not present

## 2022-01-25 DIAGNOSIS — I129 Hypertensive chronic kidney disease with stage 1 through stage 4 chronic kidney disease, or unspecified chronic kidney disease: Secondary | ICD-10-CM | POA: Diagnosis not present

## 2022-01-31 ENCOUNTER — Other Ambulatory Visit: Payer: Self-pay

## 2022-01-31 ENCOUNTER — Emergency Department (HOSPITAL_COMMUNITY): Payer: Medicare HMO

## 2022-01-31 ENCOUNTER — Emergency Department (HOSPITAL_COMMUNITY)
Admission: EM | Admit: 2022-01-31 | Discharge: 2022-01-31 | Disposition: A | Discharge status: Home / Self Care | Payer: Medicare HMO | Attending: Emergency Medicine | Admitting: Emergency Medicine

## 2022-01-31 DIAGNOSIS — Z79899 Other long term (current) drug therapy: Secondary | ICD-10-CM | POA: Insufficient documentation

## 2022-01-31 DIAGNOSIS — R4182 Altered mental status, unspecified: Secondary | ICD-10-CM | POA: Diagnosis not present

## 2022-01-31 DIAGNOSIS — Z7982 Long term (current) use of aspirin: Secondary | ICD-10-CM | POA: Diagnosis not present

## 2022-01-31 DIAGNOSIS — Z794 Long term (current) use of insulin: Secondary | ICD-10-CM | POA: Insufficient documentation

## 2022-01-31 DIAGNOSIS — I491 Atrial premature depolarization: Secondary | ICD-10-CM | POA: Diagnosis not present

## 2022-01-31 DIAGNOSIS — R739 Hyperglycemia, unspecified: Secondary | ICD-10-CM | POA: Diagnosis not present

## 2022-01-31 DIAGNOSIS — I10 Essential (primary) hypertension: Secondary | ICD-10-CM | POA: Diagnosis not present

## 2022-01-31 DIAGNOSIS — Z91040 Latex allergy status: Secondary | ICD-10-CM | POA: Diagnosis not present

## 2022-01-31 DIAGNOSIS — R410 Disorientation, unspecified: Secondary | ICD-10-CM | POA: Diagnosis not present

## 2022-01-31 DIAGNOSIS — E162 Hypoglycemia, unspecified: Secondary | ICD-10-CM | POA: Insufficient documentation

## 2022-01-31 DIAGNOSIS — Z043 Encounter for examination and observation following other accident: Secondary | ICD-10-CM | POA: Diagnosis not present

## 2022-01-31 DIAGNOSIS — R42 Dizziness and giddiness: Secondary | ICD-10-CM | POA: Diagnosis not present

## 2022-01-31 DIAGNOSIS — E11649 Type 2 diabetes mellitus with hypoglycemia without coma: Secondary | ICD-10-CM | POA: Diagnosis not present

## 2022-01-31 LAB — CBG MONITORING, ED
Glucose-Capillary: 109 mg/dL — ABNORMAL HIGH (ref 70–99)
Glucose-Capillary: 46 mg/dL — ABNORMAL LOW (ref 70–99)
Glucose-Capillary: 185 mg/dL — ABNORMAL HIGH (ref 70–99)
Glucose-Capillary: 197 mg/dL — ABNORMAL HIGH (ref 70–99)

## 2022-01-31 LAB — CBC WITH DIFFERENTIAL/PLATELET
Abs Immature Granulocytes: 0.03 10*3/uL (ref 0.00–0.07)
Basophils Absolute: 0.1 10*3/uL (ref 0.0–0.1)
Basophils Relative: 1 %
Eosinophils Absolute: 0.6 10*3/uL — ABNORMAL HIGH (ref 0.0–0.5)
Eosinophils Relative: 8 %
HCT: 36.7 % — ABNORMAL LOW (ref 39.0–52.0)
Hemoglobin: 12 g/dL — ABNORMAL LOW (ref 13.0–17.0)
Immature Granulocytes: 0 %
Lymphocytes Relative: 17 %
Lymphs Abs: 1.3 10*3/uL (ref 0.7–4.0)
MCH: 32.6 pg (ref 26.0–34.0)
MCHC: 32.7 g/dL (ref 30.0–36.0)
MCV: 99.7 fL (ref 80.0–100.0)
Monocytes Absolute: 0.7 10*3/uL (ref 0.1–1.0)
Monocytes Relative: 8 %
Neutro Abs: 5.1 10*3/uL (ref 1.7–7.7)
Neutrophils Relative %: 66 %
Platelets: 128 10*3/uL — ABNORMAL LOW (ref 150–400)
RBC: 3.68 MIL/uL — ABNORMAL LOW (ref 4.22–5.81)
RDW: 12.6 % (ref 11.5–15.5)
WBC: 7.8 10*3/uL (ref 4.0–10.5)
nRBC: 0 % (ref 0.0–0.2)

## 2022-01-31 LAB — BASIC METABOLIC PANEL
Anion gap: 7 (ref 5–15)
BUN: 27 mg/dL — ABNORMAL HIGH (ref 8–23)
CO2: 23 mmol/L (ref 22–32)
Calcium: 8.7 mg/dL — ABNORMAL LOW (ref 8.9–10.3)
Chloride: 107 mmol/L (ref 98–111)
Creatinine, Ser: 1.52 mg/dL — ABNORMAL HIGH (ref 0.61–1.24)
GFR, Estimated: 45 mL/min — ABNORMAL LOW (ref >60)
Glucose, Bld: 168 mg/dL — ABNORMAL HIGH (ref 70–99)
Potassium: 3.9 mmol/L (ref 3.5–5.1)
Sodium: 137 mmol/L (ref 135–145)

## 2022-01-31 MED ORDER — DEXTROSE 50 % IV SOLN
50.0000 mL | Freq: Once | INTRAVENOUS | Status: AC
Start: 2022-01-31 — End: 2022-01-31

## 2022-01-31 MED ORDER — DEXTROSE 50 % IV SOLN
INTRAVENOUS | Status: AC
  Administered 2022-01-31: 50 mL via INTRAVENOUS
  Filled 2022-01-31: qty 50

## 2022-01-31 NOTE — ED Triage Notes (Signed)
Pt bib [facility] from the gym for hypoglycemia. Pt blood sugar was 43. EMS gave approx. 15g D10 and blood sugar is 250. Pt was diaphoretic, only complaint is dizziness. Pt had oatmeal this morning and took is insulin. Unsure if long or short acting and unsure how much insulin he took. Pt hx of HTN and open heart surgery.   EMS vitals 200/90 BP 50 HR 250 CBG

## 2022-01-31 NOTE — ED Provider Notes (Signed)
[facility] EMERGENCY DEPARTMENT Provider Note   CSN: [id number] Arrival date & time: 01/31/22  4970     History  Chief Complaint  Patient presents with   Hypoglycemia    Justin Salinas is a 81 y.o. male.  Patient here after being found at the gym altered, slumped over in a chair.  His blood sugar was 43 upon EMS arrival.  They gave him 15 g of D10 and his blood sugar went up to 250.  Patient states that he took his morning insulin ate some oatmeal into the gym and was doing some cardio.  Has a history of hypertension and open heart surgery.  Mentation appears to greatly improved following glucose administration.  Patient still complains some dizziness here.  He is a little bit slow to answer some of his questions but he denies any chest pain, shortness of breath, weakness in his arms or legs.  The history is provided by the patient and the EMS personnel.       Home Medications Prior to Admission medications   Medication Sig Start Date End Date Taking? Authorizing Provider  ACCU-CHEK GUIDE test strip TEST BLOOD SUGAR TWICE DAILY 12/18/21   Elayne Snare, MD  alfuzosin (UROXATRAL) 10 MG 24 hr tablet Take 10 mg by mouth daily with breakfast.    [provider]  amLODipine (NORVASC) 10 MG tablet TAKE 1 TABLET EVERY DAY 11/22/21   [name], MD  amoxicillin-clavulanate (AUGMENTIN) 875-125 MG tablet Take 1 tablet by mouth 2 (two) times daily. Patient not taking: Reported on 01/03/2022 09/19/21   Vivi Barrack, MD  Ascorbic Acid (VITAMIN C) 1000 MG tablet Take 1,000 mg by mouth daily.    [provider]  aspirin 81 MG tablet Take 81 mg by mouth daily.    [provider]  azelastine (ASTELIN) 0.1 % nasal spray Place 2 sprays into both nostrils 2 (two) times daily. 09/19/21   Vivi Barrack, MD  furosemide (LASIX) 40 MG tablet Take 40 mg by mouth 2 (two) times daily.    [provider]  Glucosamine-Chondroit-Vit C-Mn (GLUCOSAMINE  1500 COMPLEX) CAPS Take by mouth.    [provider]  glucose blood (ACCU-CHEK GUIDE) test strip Use to check blood sugars 2 times daily 09/05/18   Elayne Snare, MD  insulin aspart protamine - aspart (NOVOLOG MIX 70/30 FLEXPEN) (70-30) 100 UNIT/ML FlexPen INJECT 8 UNITS UNDER THE SKIN BEFORE BREAKFAST AND 8 UNITS AT DINNER. DISCARD PEN 14 DAYS AFTER OPENING 02/22/21   Elayne Snare, MD  Insulin Pen Needle (DROPLET PEN NEEDLES) 31G X 5 MM MISC USE AS DIRECTED TWICE DAILY 09/13/21   Elayne Snare, MD  Lancets Misc. (ACCU-CHEK SOFTCLIX LANCET DEV) KIT  08/21/13   [provider]  Multiple Vitamins-Minerals (CENTRUM SILVER ADULT 50+ PO) Take by mouth.    [provider]  Omega-3 1400 MG CAPS Take 1,400 mg by mouth daily.    [provider]  PFIZER COVID-19 VAC BIVALENT injection  03/14/21   [provider]  pregabalin (LYRICA) 25 MG capsule Take 1 capsule (25 mg total) by mouth 2 (two) times daily. Patient not taking: Reported on 01/03/2022 08/22/21   Vivi Barrack, MD  repaglinide (PRANDIN) 1 MG tablet TAKE ONE TABLET BEFORE BREAKFAST AND LUNCH 10/16/21   Elayne Snare, MD  simvastatin (ZOCOR) 40 MG tablet Take 1 tablet (40 mg total) by mouth daily. 12/08/21   [name], MD  TRUEplus Lancets 28G MISC TEST BLOOD SUGAR  TWICE DAILY 07/25/21   Elayne Snare, MD      Allergies    Exenatide, Linagliptin, and Lasix [furosemide]    Review of Systems   Review of Systems  Physical Exam Updated Vital Signs  ED Triage Vitals  Enc Vitals Group     BP 01/31/22 0848 131/77     Pulse Rate 01/31/22 0848 65     Resp 01/31/22 0848 16     Temp 01/31/22 0848 (!) 97.5 F (36.4 C)     Temp Source 01/31/22 0848 Oral     SpO2 01/31/22 0848 99 %     Weight --      Height --      Head Circumference --      Peak Flow --      Pain Score 01/31/22 0843 0     Pain Loc --      Pain Edu? --      Excl. in [place]? --     Physical Exam Vitals and nursing note reviewed.   Constitutional:      General: He is not in acute distress.    Appearance: He is well-developed. He is not ill-appearing.  HENT:     Head: Normocephalic and atraumatic.     Nose: Nose normal.     Mouth/Throat:     Mouth: Mucous membranes are moist.  Eyes:     Extraocular Movements: Extraocular movements intact.     Conjunctiva/sclera: Conjunctivae normal.     Pupils: Pupils are equal, round, and reactive to light.  Cardiovascular:     Rate and Rhythm: Normal rate and regular rhythm.     Pulses: Normal pulses.     Heart sounds: Normal heart sounds. No murmur heard. Pulmonary:     Effort: Pulmonary effort is normal. No respiratory distress.     Breath sounds: Normal breath sounds.  Abdominal:     General: Abdomen is flat.     Palpations: Abdomen is soft.     Tenderness: There is no abdominal tenderness.  Musculoskeletal:        General: No swelling.     Cervical back: Normal range of motion and neck supple.  Skin:    General: Skin is warm and dry.     Capillary Refill: Capillary refill takes less than 2 seconds.  Neurological:     General: No focal deficit present.     Mental Status: He is alert and oriented to person, place, and time.     Cranial Nerves: No cranial nerve deficit.     Sensory: No sensory deficit.     Motor: No weakness.     Coordination: Coordination normal.     Comments: 5+ out of 5 strength throughout, normal sensation, no drift, normal finger-nose-finger, normal speech  Psychiatric:        Mood and Affect: Mood normal.     ED Results / Procedures / Treatments   Labs (all labs ordered are listed, but only abnormal results are displayed) Labs Reviewed  CBC WITH DIFFERENTIAL/PLATELET - Abnormal; Notable for the following components:      Result Value   RBC 3.68 (*)    Hemoglobin 12.0 (*)    HCT 36.7 (*)    Platelets 128 (*)    Eosinophils Absolute 0.6 (*)    All other components within normal limits  BASIC METABOLIC PANEL - Abnormal; Notable for  the following components:   Glucose, Bld 168 (*)    BUN 27 (*)    Creatinine, Ser  1.52 (*)    Calcium 8.7 (*)    GFR, Estimated 45 (*)    All other components within normal limits  CBG MONITORING, ED - Abnormal; Notable for the following components:   Glucose-Capillary 46 (*)    All other components within normal limits  CBG MONITORING, ED - Abnormal; Notable for the following components:   Glucose-Capillary 109 (*)    All other components within normal limits  CBG MONITORING, ED - Abnormal; Notable for the following components:   Glucose-Capillary 185 (*)    All other components within normal limits  CBG MONITORING, ED - Abnormal; Notable for the following components:   Glucose-Capillary 197 (*)    All other components within normal limits    EKG EKG Interpretation  Date/Time:  Wednesday January 31 2022 08:47:34 EDT Ventricular Rate:  56 PR Interval:  242 QRS Duration: 90 QT Interval:  480 QTC Calculation: 464 R Axis:   74 Text Interpretation: Sinus rhythm Prolonged PR interval Anteroseptal infarct, old Confirmed by Lennice Sites [phone]) on 01/31/2022 8:55:30 AM  Radiology CT Head Wo Contrast  Result Date: 01/31/2022 CLINICAL DATA:  Fall.  Hypoglycemia. EXAM: CT HEAD WITHOUT CONTRAST TECHNIQUE: Contiguous axial images were obtained from the base of the skull through the vertex without intravenous contrast. RADIATION DOSE REDUCTION: This exam was performed according to the departmental dose-optimization program which includes automated exposure control, adjustment of the mA and/or kV according to patient size and/or use of iterative reconstruction technique. COMPARISON:  None Available. FINDINGS: Brain: No evidence of acute infarction, hemorrhage, hydrocephalus, extra-axial collection or mass lesion/mass effect. Ventricular and sulcal enlargement consistent with mild to moderate atrophy. Patchy bilateral white matter hypoattenuation also noted consistent with mild chronic  microvascular ischemic change. Small linear hypoattenuation noted in the inferior left cerebellum consistent with an old infarct. Vascular: No hyperdense vessel or unexpected calcification. Skull: Normal. Negative for fracture or focal lesion. Sinuses/Orbits: Chronic appearing left maxillary sinus mucosal thickening. Globes and orbits are unremarkable. Other: None. IMPRESSION: 1. No acute intracranial abnormalities. Electronically Signed   By: Lajean Manes M.D.   On: 01/31/2022 09:56   DG Chest Portable 1 View  Result Date: 01/31/2022 CLINICAL DATA:  Altered mental status. EXAM: PORTABLE CHEST 1 VIEW COMPARISON:  April 15, 2015. FINDINGS: The heart size and mediastinal contours are within normal limits. Status post coronary bypass graft. Both lungs are clear. The visualized skeletal structures are unremarkable. IMPRESSION: No active disease. Electronically Signed   By: Marijo Conception M.D.   On: 01/31/2022 09:47    Procedures .Critical Care  Performed by: Lennice Sites, DO Authorized by: Lennice Sites, DO   Critical care provider statement:    Critical care time (minutes):  40   Critical care was necessary to treat or prevent imminent or life-threatening deterioration of the following conditions:  Endocrine crisis   Critical care was time spent personally by me on the following activities:  Blood draw for specimens, development of treatment plan with patient or surrogate, evaluation of patient's response to treatment, discussions with primary provider, examination of patient, obtaining history from patient or surrogate, ordering and performing treatments and interventions, ordering and review of laboratory studies, ordering and review of radiographic studies, pulse oximetry, re-evaluation of patient's condition and review of old charts   Care discussed with: admitting provider       Medications Ordered in ED Medications  dextrose 50 % solution 50 mL (50 mLs Intravenous Given 01/31/22 0845)     ED Course/ Medical  Decision Making/ A&P                           Medical Decision Making Amount and/or Complexity of Data Reviewed Labs: ordered. Radiology: ordered.  Risk Prescription drug management.   Justin Salinas is here with low blood sugar, altered mental status.  Patient with normal vitals.  No fever.  He was found at the gym being altered.  He was slumped over in a chair.  Blood sugar was in the 40s with EMS.  They gave him dextrose and his blood sugar came up to 250.  He is a little bit slowed here and still having some dizziness and when we rechecked his sugar is back in the 40s.  We will give an amp of D50.  Upon chart review it looks like he is on 70/30 insulin takes 8 units at breakfast and 8 units at dinner.  He said he took his morning insulin not sure exactly how much he took but he had some oatmeal and then went to the gym to do cardio.  He lives by himself.  Overall I suspect that this is accidental hypoglycemia likely in the setting of poor p.o. intake and patient working out.  I have a much lower suspicion for stroke or infectious process.  Mentation is greatly improved after dextrose.  Does not seem like he fell or hit his head but conservatively we will get a head CT and check basic labs and continue to monitor his blood sugar mental status here for the next few hours.  Given that he lives by himself I will likely have a low threshold to admit him for ops.  Per review and interpretation of labs there is no significant anemia, electrolyte abnormality, kidney injury.  Head CT is unremarkable per my review and interpretation.  Chest x-ray per my review and interpretation shows no pneumonia or pneumothorax.  Blood sugars have stabilized x3.  Patient really does want to go home which I think is reasonable.  My suspicion is that he actually just did not eat enough prior to going to the gym and that he likely took his normal amount of insulin.  Recommend that he check his  blood sugar tonight prior to giving his insulin and consider holding off if his blood sugars are on the lower side.  Recommend he follow-up with his primary care doctor.  Educated him about ways to help avoid low blood sugars.  Patient comfortable with discharge.  This chart was dictated using voice recognition software.  Despite best efforts to proofread,  errors can occur which can change the documentation meaning.         Final Clinical Impression(s) / ED Diagnoses Final diagnoses:  Hypoglycemia    Rx / DC Orders ED Discharge Orders     None         Lennice Sites, DO 01/31/22 1243

## 2022-01-31 NOTE — Discharge Instructions (Signed)
Make sure you check your blood sugar tonight prior to giving yourself your insulin.  Overall I suspect that he did not eat enough after giving her insulin this morning.  Consider skipping nighttime insulin dose if blood sugar is under 200 tonight.

## 2022-02-06 ENCOUNTER — Encounter: Payer: Self-pay | Admitting: Family Medicine

## 2022-02-06 ENCOUNTER — Ambulatory Visit (INDEPENDENT_AMBULATORY_CARE_PROVIDER_SITE_OTHER): Payer: Medicare HMO | Admitting: Family Medicine

## 2022-02-06 VITALS — BP 135/62 | HR 62 | Temp 98.1°F | Ht 69.0 in | Wt 155.0 lb

## 2022-02-06 DIAGNOSIS — I152 Hypertension secondary to endocrine disorders: Secondary | ICD-10-CM

## 2022-02-06 DIAGNOSIS — E1159 Type 2 diabetes mellitus with other circulatory complications: Secondary | ICD-10-CM | POA: Diagnosis not present

## 2022-02-06 DIAGNOSIS — E1165 Type 2 diabetes mellitus with hyperglycemia: Secondary | ICD-10-CM

## 2022-02-06 NOTE — Assessment & Plan Note (Signed)
At goal today on amlodipine 10 mg daily and hydralazine 50 mg 3 times daily.

## 2022-02-06 NOTE — Patient Instructions (Signed)
It was very nice to see you today!  I am glad that you are feeling better.  Please call to schedule an appointment with Dr. Dwyane Dee soon.  I will see you back next year when you are due for your annual physical.  Please come back to see me sooner if needed.  Take care, Dr [name]  PLEASE NOTE:  If you had any lab tests please let us know if you have not heard back within a few days. You may see your results on mychart before we have a chance to review them but we will give you a call once they are reviewed by [place]. If we ordered any referrals today, please let us know if you have not heard from their office within the next week.   Please try these tips to maintain a healthy lifestyle:  Eat at least 3 REAL meals and 1-2 snacks per day.  Aim for no more than 5 hours between eating.  If you eat breakfast, please do so within one hour of getting up.   Each meal should contain half fruits/vegetables, one quarter protein, and one quarter carbs (no bigger than a computer mouse)  Cut down on sweet beverages. This includes juice, soda, and sweet tea.   Drink at least 1 glass of water with each meal and aim for at least 8 glasses per day  Exercise at least 150 minutes every week.

## 2022-02-06 NOTE — Progress Notes (Signed)
Justin Salinas is a 81 y.o. male who presents today for an office visit.  Assessment/Plan:  New/Acute Problems: Hypoglycemia No recurrences since being in the ED. Seems like he probably either forgot to eat or accidentally took a double dose of his morning insulin. Patient is not sure why his sugars were low.  He has not had this issue before.  His home sugars have been typically well controlled without any symptomatic lows. He will continue current insulin regimen though encouraged him to call to schedule an appointment with endocrinology soon. Would be reasonable to loosen A1c goals based on his age and recent hypoglycemic event.   Chronic Problems Addressed Today: Type 2 diabetes mellitus with hyperglycemia (HCC) Continue management per endocrinology as above.   Hypertension associated with diabetes (HCC) At goal today on amlodipine 10 mg daily and hydralazine 50 mg 3 times daily.     Subjective:  HPI:  Patient here for ED follow up.  Went to ED on 01/31/2022 after being found unresponsive at the gym.  EMS was called.  Found to have blood sugar of 43.  Was given D10 and blood sugar improved to 250.  This sugar dropped again and he was given an amp of D50 in the ED.  His mentation and appearance improved significantly after D50.  Had labs and head CT scan which was negative.  Blood sugar was stabilized and he was discharged home.  Over the last week he has been doing well. He has been checking sugars at home and they have mostly well controlled. Nothing lower than 70s or higher than 200. He has been compliant with his medications. No missed doses.   ROS: Per HPI, otherwise a complete review of systems was negative.   PMH:  The following were reviewed and entered/updated in epic: Past Medical History:  Diagnosis Date   Allergy    Anemia    Asthma    Atherosclerosis of abdominal aorta (HCC) 02/26/2020   Seen on x-ray L-spine September 2021   BPH (benign prostatic  hyperplasia)    CAD (coronary artery disease)    a. s/p MI 09/1993;  b. known CTO of RCA;  c. 06/2014 Cath: LM short/mod dzs, LAD 95ost, LCX nl, RI nl, RCA 100, EF 55%;  c. 06/2014 s/p CABG x 4: LIMA->LAD, VG->D1, VG->OM1, VG->Acute Marginal.   Carotid stenosis    a. Carotid US 1/17:  RICA 40-59%; LICA 60-79% // Carotid US 1/22: R 1-39; L 40-59; L VA aberrant flow; bilat subclavian stenosis // Carotid US 1/23: R 1-39, L 40-59; R subclavian stenosis   CKD (chronic kidney disease), stage III (HCC)    Constipation    in the past 2-3 weeks   Diabetes (HCC)    AODM   Diastolic dysfunction    a. 05/2015 Echo: EF 60-65%, LVH, Gr 1 DD, mild MR, mod dil RA/LA, mod TR, PASP .   Essential hypertension    GERD (gastroesophageal reflux disease)    History of kidney stones    Hyperlipidemia    Patient Active Problem List   Diagnosis Date Noted   Lumbar radiculopathy 09/23/2020   Atherosclerosis of abdominal aorta (HCC) 02/26/2020   Status post placement of bone anchored hearing aid (BAHA) 05/09/2019   Asymmetric SNHL (sensorineural hearing loss) 04/02/2019   Iron deficiency anemia 10/22/2017   Type 2 diabetes mellitus with hyperglycemia (HCC) 01/03/2017   PVD (posterior vitreous detachment), left eye 02/16/2016   Vitreous syneresis of right eye 02/16/2016   Diastolic  dysfunction    Hypertension associated with diabetes ([place])    CKD stage 3 due to type 2 diabetes mellitus ([place])    S/P CABG x 4 05/21/2014   Bilateral dry eyes 01/20/2014   Nuclear cataract, bilateral 12/10/2013   Astigmatism 01/11/2012   Hypermetropia 01/11/2012   Presbyopia 01/11/2012   Carotid artery disease ([place]) 12/01/2007   BPH (benign prostatic hyperplasia) 12/01/2007   Dyslipidemia associated with type 2 diabetes mellitus ([place]) 11/28/2007   CAD (coronary artery disease) 11/28/2007   GERD 11/28/2007   Past Surgical History:  Procedure Laterality Date   ANGIOPLASTY  09/22/1993   POBA of RCA (Dr. Marella Chimes)    [place]   back fusion - Dr. Earnie Larsson   CARDIAC CATHETERIZATION  11/28/1998   "silent occlusion" of RCA w/collaterals from left coronary system, prox LAD w/40-50% eccentric narrowing, 1st diagonal with 70-80% eccentric narrowing, 85% narrowing of prox small OM1 (Dr. Marella Chimes)   CAROTID DOPPLER  07/2011   left subclavian (50-69%); right bulb (0-49%); RICA (normal); left mid-distal CCA (0-49%); left bulb/prox ICA (50-69%); left vertebral with abnormal antegrade flow   CORONARY ARTERY BYPASS GRAFT N/A 05/21/2014   Procedure: CORONARY ARTERY BYPASS GRAFTING (CABG) x  four, using left internal mammary artery and right leg greater saphenous vein harvested endoscopically;  Surgeon: Melrose Nakayama, MD;  Location: [place];  Service: Open Heart Surgery;  Laterality: N/A;   INTRAOPERATIVE TRANSESOPHAGEAL ECHOCARDIOGRAM N/A 05/21/2014   Procedure: INTRAOPERATIVE TRANSESOPHAGEAL ECHOCARDIOGRAM;  Surgeon: Melrose Nakayama, MD;  Location: [place];  Service: Open Heart Surgery;  Laterality: N/A;   LEFT HEART CATHETERIZATION WITH CORONARY ANGIOGRAM N/A 05/19/2014   Procedure: LEFT HEART CATHETERIZATION WITH CORONARY ANGIOGRAM;  Surgeon: Blane Ohara, MD;  Location[facility] C[facility] LAB;  Service: Cardiovascular;  Laterality: N/A;   LUMBAR LAMINECTOMY/DECOMPRESSION MICRODISCECTOMY Left 09/23/2020   Procedure: Laminectomy and Foraminotomy - left - Lumbar three-Lumbar four;  Surgeon: Earnie Larsson, MD;  Location: [place];  Service: Neurosurgery;  Laterality: Left;   NASAL SINUS SURGERY  2010   NM MYOCAR PERF WALL MOTION  09/19/2009   bruce myoview - mild perfusion defect in basal inferior region (infarct/scar), EF 60%, low risk scan   RENAL DOPPLER  10/2011   SMA w/ 70-99% diameter reduction & high grade stenosis; R&L renals w/narrowing and increased velocities (60-99%), R kidney smaller than L   TRANSTHORACIC ECHOCARDIOGRAM  10/20/2012   EF 55-60%, moderate concentric hypertrophy, ventricular septum  thickness increased, calcified MV annulus   VASECTOMY  1963    Family History  Problem Relation Age of Onset   Uterine cancer Mother    Stroke Father    Diabetes Sister    Colon polyps Brother    Heart disease Brother    Colon cancer Neg Hx    Esophageal cancer Neg Hx    Rectal cancer Neg Hx    Stomach cancer Neg Hx     Medications- reviewed and updated Current Outpatient Medications  Medication Sig Dispense Refill   ACCU-CHEK GUIDE test strip TEST BLOOD SUGAR TWICE DAILY 200 strip 1   alfuzosin (UROXATRAL) 10 MG 24 hr tablet Take 10 mg by mouth daily with breakfast.     amLODipine (NORVASC) 10 MG tablet TAKE 1 TABLET EVERY DAY 15 tablet 0   Ascorbic Acid (VITAMIN C) 1000 MG tablet Take 1,000 mg by mouth daily.     aspirin 81 MG tablet Take 81 mg by mouth daily.     carvedilol (COREG) 6.25  MG tablet Take 6.25 mg by mouth 2 (two) times daily with a meal.     furosemide (LASIX) 40 MG tablet Take 40 mg by mouth 2 (two) times daily.     Glucosamine-Chondroit-Vit C-Mn (GLUCOSAMINE 1500 COMPLEX) CAPS Take by mouth.     glucose blood (ACCU-CHEK GUIDE) test strip Use to check blood sugars 2 times daily 200 each 1   insulin aspart protamine - aspart (NOVOLOG MIX 70/30 FLEXPEN) (70-30) 100 UNIT/ML FlexPen INJECT 8 UNITS UNDER THE SKIN BEFORE BREAKFAST AND 8 UNITS AT DINNER. DISCARD PEN 14 DAYS AFTER OPENING 30 mL 0   Insulin Pen Needle (DROPLET PEN NEEDLES) 31G X 5 MM MISC USE AS DIRECTED TWICE DAILY 200 each 2   Lancets Misc. (ACCU-CHEK SOFTCLIX LANCET DEV) KIT      Multiple Vitamins-Minerals (CENTRUM SILVER ADULT 50+ PO) Take by mouth.     Omega-3 1400 MG CAPS Take 1,400 mg by mouth daily.     repaglinide (PRANDIN) 1 MG tablet TAKE ONE TABLET BEFORE BREAKFAST AND LUNCH 180 tablet 2   simvastatin (ZOCOR) 40 MG tablet Take 1 tablet (40 mg total) by mouth daily. 15 tablet 0   sodium chloride (OCEAN) 0.65 % SOLN nasal spray Place 1 spray into both nostrils as needed for congestion.      azelastine (ASTELIN) 0.1 % nasal spray Place 2 sprays into both nostrils 2 (two) times daily. 30 mL 12   pregabalin (LYRICA) 25 MG capsule Take 1 capsule (25 mg total) by mouth 2 (two) times daily. (Patient not taking: Reported on 01/03/2022) 60 capsule 5   TRUEplus Lancets 28G MISC TEST BLOOD SUGAR TWICE DAILY (Patient not taking: Reported on 02/06/2022) 200 each 5   No current facility-administered medications for this visit.    Allergies-reviewed and updated Allergies  Allergen Reactions   Exenatide Other (See Comments)   Linagliptin Other (See Comments)   Lasix [Furosemide] Other (See Comments)    Constipation    Social History   Socioeconomic History   Marital status: Widowed    Spouse name: Not on file   Number of children: 2   Years of education: Not on file   Highest education level: Not on file  Occupational History   Occupation: Retired   Tobacco Use   Smoking status: Former    Types: Cigarettes    Quit date: 06/04/1972    Years since quitting: 49.7   Smokeless tobacco: Current    Types: Chew   Tobacco comments:    quit about 40 years  Vaping Use   Vaping Use: Never used  Substance and Sexual Activity   Alcohol use: Yes    Alcohol/week: 3.0 standard drinks of alcohol    Types: 3 Standard drinks or equivalent per week    Comment: a glass of red wine 3 or 4 days a week.   Drug use: No   Sexual activity: Not on file  Other Topics Concern   Not on file  Social History Narrative   Recently got puppy May 2020   Long-term girlfriend of 36 years died Dec [id number]; complications from diabetes   Daughter lives nearby and assists as needed    Social Determinants of Health   Financial Resource Strain: Low Risk  (06/16/2021)   Overall Financial Resource Strain (CARDIA)    Difficulty of Paying Living Expenses: Not hard at all  Food Insecurity: No Food Insecurity (01/03/2022)   Hunger Vital Sign    Worried About Running Out of Food in the Last  Year: Never true    [place] in the Last Year: Never true  Recent Concern: Food Insecurity - Food Insecurity Present (01/03/2022)   Hunger Vital Sign    Worried About Running Out of Food in the Last Year: Often true    Ran Out of Food in the Last Year: Often true  Transportation Needs: No Transportation Needs (01/03/2022)   PRAPARE - Hydrologist (Medical): No    Lack of Transportation (Non-Medical): No  Physical Activity: Insufficiently Active (06/16/2021)   Exercise Vital Sign    Days of Exercise per Week: 2 days    Minutes of Exercise per Session: 60 min  Stress: No Stress Concern Present (06/16/2021)   [place]    Feeling of Stress : Not at all  Social Connections: Moderately Integrated (06/16/2021)   Social Connection and Isolation Panel [NHANES]    Frequency of Communication with Friends and Family: Twice a week    Frequency of Social Gatherings with Friends and Family: Twice a week    Attends Religious Services: More than 4 times per year    Active Member of [employer] or Organizations: Yes    Attends Archivist Meetings: 1 to 4 times per year    Marital Status: Widowed          Objective:  Physical Exam: BP 135/62   Pulse 62   Temp 98.1 F (36.7 C) (Temporal)   Ht $R'5\' 9"'yK$  (1.753 m)   Wt 155 lb (70.3 kg)   SpO2 97%   BMI 22.89 kg/m   Wt Readings from Last 3 Encounters:  02/06/22 155 lb (70.3 kg)  11/08/21 162 lb 12.8 oz (73.8 kg)  09/19/21 163 lb (73.9 kg)    Gen: No acute distress, resting comfortably CV: Regular rate and rhythm with no murmurs appreciated Pulm: Normal work of breathing, clear to auscultation bilaterally with no crackles, wheezes, or rhonchi Neuro: Grossly normal, moves all extremities Psych: Normal affect and thought content  Time Spent: 45 minutes of total time was spent on the date of the encounter performing the following actions: chart review prior to seeing the  patient including recent ED visit, obtaining history, performing a medically necessary exam, counseling on the treatment plan, placing orders, and documenting in our EHR.        [name]. [name], MD 02/06/2022 11:16 AM

## 2022-02-06 NOTE — Assessment & Plan Note (Signed)
Continue management per endocrinology as above.

## 2022-02-07 NOTE — Progress Notes (Signed)
Chronic Care Management Pharmacy Note  02/12/2022 Name:  Justin Salinas MRN:  [id number] DOB:  1940/09/05  Summary: PharmD Fu visit.  Recent ED visit for hypoglycemia.  Appears he gave too much insulin.  Provided tips on accurate dosing today.  Also to clarify BP meds - it appears he is now on amlodipine and carvedilol - reports he stopped hydralazine and started carvedilol at direction of kidney doctor.  Based on fill dates - adherence may be a concern.  Recommendations/Changes made from today's visit: CMA to call and to med check while he is with bottles   Plan: FU 6 months pharmD CMA to check meds in two weeks   Subjective: Justin Salinas is an 81 y.o. year old male who is a primary patient of Ardith Dark, MD.  The CCM team was consulted for assistance with disease management and care coordination needs.    Engaged with patient face to face for follow up visit in response to provider referral for pharmacy case management and/or care coordination services.   Consent to Services:  The patient was given the following information about Chronic Care Management services today, agreed to services, and gave verbal consent: 1. CCM service includes personalized support from designated clinical staff supervised by the primary care provider, including individualized plan of care and coordination with other care providers 2. 24/7 contact phone numbers for assistance for urgent and routine care needs. 3. Service will only be billed when office clinical staff spend 20 minutes or more in a month to coordinate care. 4. Only one practitioner may furnish and bill the service in a calendar month. 5.The patient may stop CCM services at any time (effective at the end of the month) by phone call to the office staff. 6. The patient will be responsible for cost sharing (co-pay) of up to 20% of the service fee (after annual deductible is met). Patient agreed to services and consent obtained.  Patient  Care Team: Ardith Dark, MD as PCP - General (Family Medicine) Tonny Bollman, MD as PCP - Cardiology (Cardiology) Keturah Barre, MD as Consulting Physician (Otolaryngology) Reather Littler, MD as Consulting Physician (Endocrinology) Luane School, OD as Consulting Physician (Optometry) Annie Sable, MD as Consulting Physician (Nephrology) Jerilee Field, MD as Consulting Physician (Urology) Annie Sable, MD as Consulting Physician (Nephrology) Antony Contras, MD as Consulting Physician (Ophthalmology) Erroll Luna, [facility] as Pharmacist (Pharmacist) Alejandro Mulling, RN as [facility] Care Management  Recent office visits:  07/27/2021 OV (PCP) Ardith Dark, MD; We will give 80 mg of Depo-Medrol today.  We will also start low-dose Lyrica.  He will follow-up with me in 1 to 2 weeks.  We can adjust the dose of Lyrica as needed for Lumbar radiculopathy,  If GFR is above 30 may consider low-dose of Toradol injection as needed.   02/27/2021 Thornell Sartorius, PA; no medication changes indicated.   02/22/2021 Thornell Sartorius, PA; no medication changes indicated.     Recent consult visits:  06/21/2021 OP Visit (Audiology) Ruthell Rummage, AuD; No medication changes indicated.   06/06/2021 OV (Endocrinology) [name]; he needs to follow instructions given Needs to make sure he takes his Prandin before starting to eat, He will start taking 6 units of insulin at lunchtime and also reduce his morning dose to 6 units He will not increase his evening dose even if blood sugars are high.   05/04/2021 OV (Otolaryngology) Maura Crandall, PA-C; No medication changes  indicated.   04/24/2021 OV (Otolaryngology) Velora Mediate, PA-C; Finish 10 day course of Keflex   04/20/2021 OV (Otolaryngology) Velora Mediate, PA-C; We will start Keflex 500 BID for 10 days (he has CKD 3) and will get a stat temporal bone CT today or tomorrow to  try to assess for abscess. I will have him follow up with me on Monday for recheck.   02/28/2021 OV (Endocrinology) Elayne Snare, MD; Considering he has low normal readings at times in the morning we will reduce suppertime dose to 6 units  Also unless he is not eating any carbohydrates he will take Prandin at dinnertime also since he likely has high readings after eating , Since he likely will have difficulty with more complicated regimens he will continue premixed insulin twice a day for now. Likely needs 80 mg of Lasix for edema but he will confirm with his nephrologist.     Hospital visits:  None in previous 6 months  Objective:  Lab Results  Component Value Date   CREATININE 1.52 (H) 01/31/2022   BUN 27 (H) 01/31/2022   GFR 40.00 (L) 09/19/2021   EGFR 41 07/20/2021   GFRNONAA 45 (L) 01/31/2022   GFRAA 46 (L) 06/19/2018   NA 137 01/31/2022   K 3.9 01/31/2022   CALCIUM 8.7 (L) 01/31/2022   CO2 23 01/31/2022   GLUCOSE 168 (H) 01/31/2022    Lab Results  Component Value Date/Time   HGBA1C 6.7 (A) 11/08/2021 09:44 AM   HGBA1C 7.0 (A) 06/06/2021 09:29 AM   HGBA1C 5.9 (H) 09/23/2020 05:51 AM   HGBA1C 7.3 (H) 07/20/2019 10:57 AM   HGBA1C 9.7 12/03/2016 12:00 AM   FRUCTOSAMINE 340 (H) 06/21/2020 01:58 PM   FRUCTOSAMINE 340 (H) 02/23/2020 09:00 AM   GFR 40.00 (L) 09/19/2021 08:26 AM   GFR 28.57 (L) 06/21/2020 01:58 PM   MICROALBUR <0.7 09/19/2021 08:26 AM   MICROALBUR <0.7 04/27/2019 08:30 AM    Last diabetic Eye exam:  Lab Results  Component Value Date/Time   HMDIABEYEEXA No Retinopathy 05/31/2021 12:00 AM    Last diabetic Foot exam: No results found for: "HMDIABFOOTEX"   Lab Results  Component Value Date   CHOL 149 09/19/2021   HDL 74.70 09/19/2021   LDLCALC 62 09/19/2021   LDLDIRECT 45.0 04/17/2017   TRIG 61.0 09/19/2021   CHOLHDL 2 09/19/2021       Latest Ref Rng & Units 09/19/2021    8:26 AM 07/20/2021   12:00 AM 04/21/2021   12:00 AM  Hepatic Function  Total  Protein 6.0 - 8.3 g/dL 7.3     Albumin 3.5 - 5.2 g/dL 4.4  4.4     4.4      AST 0 - 37 U/L 22     ALT 0 - 53 U/L 16     Alk Phosphatase 39 - 117 U/L 75     Total Bilirubin 0.2 - 1.2 mg/dL 0.7        This result is from an external source.    Lab Results  Component Value Date/Time   TSH 2.79 09/19/2021 08:26 AM   TSH 1.55 10/16/2017 08:44 AM       Latest Ref Rng & Units 01/31/2022    8:52 AM 09/19/2021    8:26 AM 07/20/2021   12:00 AM  CBC  WBC 4.0 - 10.5 K/uL 7.8  6.1    Hemoglobin 13.0 - 17.0 g/dL 12.0  13.3  12.4      Hematocrit 39.0 -  52.0 % 36.7  39.9    Platelets 150 - 400 K/uL 128  154.0       This result is from an external source.    Lab Results  Component Value Date/Time   VD25OH 45.05 10/22/2017 08:22 AM    Clinical ASCVD: No  The ASCVD Risk score (Arnett DK, et al., 2019) failed to calculate for the following reasons:   The 2019 ASCVD risk score is only valid for ages 64 to 30       02/06/2022   11:11 AM 01/03/2022    2:54 PM 06/16/2021    8:49 AM  Depression screen PHQ 2/9  Decreased Interest 1 0 0  Down, Depressed, Hopeless 2 1 1   PHQ - 2 Score 3 1 1   Altered sleeping 0    Tired, decreased energy 2    PHQ-9 Score 5       Social History   Tobacco Use  Smoking Status Former   Types: Cigarettes   Quit date: 06/04/1972   Years since quitting: 49.7  Smokeless Tobacco Current   Types: Chew  Tobacco Comments   quit about 40 years   BP Readings from Last 3 Encounters:  02/06/22 135/62  01/31/22 (!) 202/79  11/08/21 (!) 146/66   Pulse Readings from Last 3 Encounters:  02/06/22 62  01/31/22 (!) 57  11/08/21 (!) 56   Wt Readings from Last 3 Encounters:  02/06/22 155 lb (70.3 kg)  11/08/21 162 lb 12.8 oz (73.8 kg)  09/19/21 163 lb (73.9 kg)   BMI Readings from Last 3 Encounters:  02/06/22 22.89 kg/m  11/08/21 24.04 kg/m  09/19/21 24.07 kg/m    Assessment/Interventions: Review of patient past medical history, allergies, medications,  health status, including review of consultants reports, laboratory and other test data, was performed as part of comprehensive evaluation and provision of chronic care management services.   SDOH:  (Social Determinants of Health) assessments and interventions performed: No, done within the year Financial Resource Strain: Low Risk  (06/16/2021)   Overall Financial Resource Strain (CARDIA)    Difficulty of Paying Living Expenses: Not hard at all    [place] from 02/08/2022 in [place] Patient Outreach Telephone from 01/03/2022 in [place] Interventions    Food Insecurity Interventions Intervention Not Indicated Intervention Not Indicated  Housing Interventions -- Intervention Not Indicated  Transportation Interventions -- Intervention Not Indicated      Financial Resource Strain: Low Risk  (06/16/2021)   Overall Financial Resource Strain (CARDIA)    Difficulty of Paying Living Expenses: Not hard at all   Food Insecurity: No Food Insecurity (02/08/2022)   Hunger Vital Sign    Worried About Running Out of Food in the Last Year: Never true    Ran Out of Food in the Last Year: Never true  Recent Concern: Food Insecurity - Food Insecurity Present (01/03/2022)   Hunger Vital Sign    Worried About Running Out of Food in the Last Year: Often true    Ran Out of Food in the Last Year: Often true    SDOH Screenings   Food Insecurity: No Food Insecurity (02/08/2022)  Recent Concern: [place] Present (01/03/2022)  Housing: Low Risk  (01/03/2022)  Transportation Needs: No Transportation Needs (01/03/2022)  Depression (PHQ2-9): Medium Risk (02/06/2022)  Financial Resource Strain: Low Risk  (06/16/2021)  Physical Activity: Insufficiently Active (06/16/2021)  Social  Connections: Moderately Integrated (06/16/2021)  Stress: No Stress Concern Present  (06/16/2021)  Tobacco Use: High Risk (02/06/2022)    CCM Care Plan  Allergies  Allergen Reactions   Exenatide Other (See Comments)   Linagliptin Other (See Comments)   Lasix [Furosemide] Other (See Comments)    Constipation    Medications Reviewed Today     Reviewed by Edythe Clarity, [facility] (Pharmacist) on 02/12/22 at 1620  Med List Status: <None>   Medication Order Taking? Sig Documenting Provider Last Dose Status Informant  ACCU-CHEK GUIDE test strip [id number] Yes TEST BLOOD SUGAR TWICE DAILY Elayne Snare, MD Taking Active   alfuzosin (UROXATRAL) 10 MG 24 hr tablet [id number] Yes Take 10 mg by mouth daily with breakfast. [provider] Taking Active Self  amLODipine (NORVASC) 10 MG tablet [id number] Yes TAKE 1 TABLET EVERY Lacie Scotts, MD Taking Active   Ascorbic Acid (VITAMIN C) 1000 MG tablet [id number] Yes Take 1,000 mg by mouth daily. [provider] Taking Active Self  aspirin 81 MG tablet [id number] Yes Take 81 mg by mouth daily. [provider] Taking Active Self           Med Note Dina Rich, Darden Dates   Fri Sep 16, 2020 12:14 PM) On hold  azelastine (ASTELIN) 0.1 % nasal spray [id number] Yes Place 2 sprays into both nostrils 2 (two) times daily. Vivi Barrack, MD Taking Active   carvedilol (COREG) 6.25 MG tablet [id number] Yes Take 6.25 mg by mouth 2 (two) times daily with a meal. [provider] Taking Active   furosemide (LASIX) 40 MG tablet [id number] Yes Take 40 mg by mouth 2 (two) times daily. [provider] Taking Active Self  Glucosamine-Chondroit-Vit C-Mn (GLUCOSAMINE 1500 COMPLEX) CAPS [id number] Yes Take by mouth. [provider] Taking Active   glucose blood (ACCU-CHEK GUIDE) test strip [id number] Yes Use to check blood sugars 2 times daily Elayne Snare, MD Taking Active Self  insulin aspart protamine - aspart (NOVOLOG MIX 70/30 FLEXPEN) (70-30) 100 UNIT/ML FlexPen [id number] Yes INJECT 8 UNITS UNDER THE SKIN  BEFORE BREAKFAST AND 8 UNITS AT DINNER. DISCARD PEN [place] Elayne Snare, MD Taking Active   Insulin Pen Needle (DROPLET PEN NEEDLES) 31G X 5 MM MISC [id number] Yes USE AS DIRECTED TWICE DAILY Elayne Snare, MD Taking Active   Lancets Misc. ([place]) KIT [id number] Yes  [provider] Taking Active Self           Med Note Olena Heckle, MICHELE   Tue Feb 12, 2017  8:16 AM)    Multiple Vitamins-Minerals (CENTRUM SILVER ADULT 50+ PO) [id number] Yes Take by mouth. [provider] Taking Active Self  Omega-3 1400 MG CAPS [id number] Yes Take 1,400 mg by mouth daily. [provider] Taking Active Self  pregabalin (LYRICA) 25 MG capsule [id number] Yes Take 1 capsule (25 mg total) by mouth 2 (two) times daily. Vivi Barrack, MD Taking Active   repaglinide (PRANDIN) 1 MG tablet [id number] Yes TAKE ONE TABLET BEFORE BREAKFAST AND LUNCH Elayne Snare, MD Taking Active   simvastatin (ZOCOR) 40 MG tablet [id number] Yes Take 1 tablet (40 mg total) by mouth daily. [name], MD Taking Active   sodium chloride (OCEAN) 0.65 % SOLN nasal spray [id number] Yes Place 1 spray into both nostrils as needed for congestion. [provider] Taking Active   TRUEplus Lancets 28G [place] [id number] Yes TEST BLOOD SUGAR TWICE DAILY Elayne Snare, MD Taking Active   Med List Note Valora Piccolo,  Gillis Ends, RN 11/06/17 1229):              Patient Active Problem List   Diagnosis Date Noted   Lumbar radiculopathy 09/23/2020   Atherosclerosis of abdominal aorta ([place]) 02/26/2020   Status post placement of bone anchored hearing aid (BAHA) 05/09/2019   Asymmetric SNHL (sensorineural hearing loss) 04/02/2019   Iron deficiency anemia 10/22/2017   Type 2 diabetes mellitus with hyperglycemia ([place]) 01/03/2017   PVD (posterior vitreous detachment), left eye 02/16/2016   Vitreous syneresis of right eye 33/54/5625   Diastolic dysfunction    Hypertension associated with diabetes  ([place])    CKD stage 3 due to type 2 diabetes mellitus ([place])    S/P CABG x 4 05/21/2014   Bilateral dry eyes 01/20/2014   Nuclear cataract, bilateral 12/10/2013   Astigmatism 01/11/2012   Hypermetropia 01/11/2012   Presbyopia 01/11/2012   Carotid artery disease ([place]) 12/01/2007   BPH (benign prostatic hyperplasia) 12/01/2007   Dyslipidemia associated with type 2 diabetes mellitus ([place]) 11/28/2007   CAD (coronary artery disease) 11/28/2007   GERD 11/28/2007    Immunization History  Administered Date(s) Administered   Fluad Quad(high Dose 65+) 03/02/2019, 02/15/2020, 02/22/2021, 02/12/2022   Influenza Split 02/23/2009, 02/02/2010, 03/05/2011, 02/18/2012, 02/02/2017   Influenza Whole 03/23/2009   Influenza, High Dose Seasonal PF 02/02/2017, 02/17/2018   Influenza-Unspecified 05/04/2003   PFIZER(Purple Top)SARS-COV-2 Vaccination 07/26/2019, 08/25/2019, 03/17/2020   Pfizer Covid-19 Vaccine Bivalent Booster 31yrs & up 03/14/2021   Pneumococcal Conjugate-13 04/12/2014   Pneumococcal Polysaccharide-23 02/23/2009, 03/23/2009   Tdap 12/03/2016   Zoster Recombinat (Shingrix) 06/24/2020, 08/24/2020   Zoster, Live 05/29/2013    Conditions to be addressed/monitored:  CAD, HTN, GERD, Type II DM, HLD  Care Plan : General Pharmacy (Adult)  Updates made by Edythe Clarity, RPH since 02/12/2022 12:00 AM     Problem: CAD, HTN, GERD, Type II DM, HLD   Priority: High  Onset Date: 08/08/2021     Long-Range Goal: Patient-Specific Goal   Start Date: 08/08/2021  Expected End Date: 02/08/2022  Recent Progress: On track  Priority: High  Note:   Current Barriers:  Recent hypoglycemia  Pharmacist Clinical Goal(s):  Patient will achieve improvement in BP as evidenced by monitoring through collaboration with PharmD and provider.   Interventions: 1:1 collaboration with Vivi Barrack, MD regarding development and update of comprehensive plan of care as evidenced by provider attestation and  co-signature Inter-disciplinary care team collaboration (see longitudinal plan of care) Comprehensive medication review performed; medication list updated in electronic medical record  Hypertension (BP goal <130/80) 02/12/22 -Controlled at last in office reading -Current treatment: Carvedilol 6.25mg  twice daily Appropriate, Query effective, Amlodipine 10mg  daily Appropriate, Effective, Safe, Accessible -Medications previously tried: chorthalidone, losartan, lisinopril, hydralazine -Current home readings: unsure, no logs -Denies hypotensive/hypertensive symptoms -Educated on BP goals and benefits of medications for prevention of heart attack, stroke and kidney damage; Daily salt intake goal < 2300 mg; Symptoms of hypotension and importance of maintaining adequate hydration; -Counseled to monitor BP at home daily, document, and provide log at future appointments -He admits to only taking amlodipine when needed previously.  Now reports he is taking daily.  Also is no longer taking hydralazine and reports his kidney doctor started him on carvedilol.  BP close to goal at last office visit.  Monitor for now.  Will have CMA call to check on meds he has at home/adherence.  Hyperlipidemia: (LDL goal < 70) -Controlled -Current treatment: Simvastatin 40mg  daily Appropriate, Effective, Safe,  Accessible -Medications previously tried: none noted  -Current dietary patterns: see above -Current exercise habits: see above -Educated on Cholesterol goals;  Benefits of statin for ASCVD risk reduction; Importance of limiting foods high in cholesterol; -Recommended to continue current medication LDL at goal < 70 no changes needed  Diabetes (A1c goal <7%) 02/12/22 -Controlled, based on most recent A1c of 6.7% -Current medications: Repaglinide 1mg  BID Appropriate, Effective, Safe, Accessible Novolog 70/30 6 units before breakfast and 8 units at dinner Appropriate, Effective, Safe, Accessible -Medications  previously tried: metformin, januvia  -Current home glucose readings fasting glucose: 135 this morning, normally 100-130 post prandial glucose: not checking after meals -Denies hypoglycemic/hyperglycemic symptoms -Current exercise: active around the yard, goes to the gym 2-3 times per week -Educated on A1c and blood sugar goals; Prevention and management of hypoglycemic episodes; Benefits of routine self-monitoring of blood sugar; -Counseled to check feet daily and get yearly eye exams -Recent episode at the gym where he passed out and went to the emergency department with hypoglycemia.  Unclear why glucose dropped, he remembers eating oatmeal that morning.  After discussion may have been in a hurry and gave too much insulin.  Has not happened since.  Provided tips for accurate dosing.  Managed by endocrine, would not recommend any changes at this time due to one time occurrence.  He does keep emergency glucose source on hand.  Patient Goals/Self-Care Activities Patient will:  - take medications as prescribed as evidenced by patient report and record review check blood pressure daily, document, and provide at future appointments target a minimum of 150 minutes of moderate intensity exercise weekly engage in dietary modifications by limiting salt  Follow Up Plan: The care management team will reach out to the patient again over the next 180 days.            Medication Assistance: None required.  Patient affirms current coverage meets needs.  Compliance/Adherence/Medication fill history: Care Gaps: N/A  Star-Rating Drugs: Simvastatin 40 mg last filled 12/12/21 30 DS - reports had supply on hand Repaglinide 1 mg last filled 12/28/2021 90 DS  Patient's preferred pharmacy is:  [id number] - [place], [address] [place] [address] Phone: [phone] Fax: [phone]  [place], [place] [address] Phone: [phone] Fax: [phone]  [id number] - [place], [place] - [address] [place] [address] Phone: [phone] Fax: [phone]  Uses pill box? No - takes out of vials.  Daughter keeps track Pt endorses 100% compliance  We discussed: Benefits of medication synchronization, packaging and delivery as well as enhanced pharmacist oversight with [employer]. Patient decided to: Continue current medication management strategy  Care Plan and Follow Up Patient Decision:  Patient agrees to Care Plan and Follow-up.  Plan: The care management team will reach out to the patient again over the next 180 days.  Beverly Milch, PharmD Clinical Pharmacist  [place] [phone]

## 2022-02-08 ENCOUNTER — Ambulatory Visit: Payer: Self-pay | Admitting: *Deleted

## 2022-02-08 NOTE — Patient Instructions (Signed)
Visit Information  Thank you for taking time to visit with me today. Please don't hesitate to contact me if I can be of assistance to you.   Following are the goals we discussed today:   Goals Addressed               This Visit's Progress     COMPLETED: Not satisfied with BP medication (Amlodipine) (pt-stated)        Care Coordination Interventions: Provided education on importance of blood pressure control in management of CAD Provided education on Importance of limiting foods high in cholesterol Reviewed Importance of taking all medications as prescribed Reviewed Importance of attending all scheduled provider appointments Advised patient to discuss Dr. [name] with provider Screening for signs and symptoms of depression related to chronic disease state Assessed social determinant of health barriers RN case manager will communicate with PCP concerning pt's medication issue  (Amlodipine) 9/7 Pt reports he is "feeling  better" with no additional issues related to his blood pressure medications. Also states he had a recent visit with his provider.          Please call the care guide team at [phone] if you need to cancel or reschedule your appointment.   If you are experiencing a Mental Health or [place] or need someone to talk to, please call the Suicide and Crisis Lifeline: 988  Patient verbalizes understanding of instructions and care plan provided today and agrees to view in [place]. Active MyChart status and patient understanding of how to access instructions and care plan via MyChart confirmed with patient.     No further follow up required: No additional needs.  Raina Mina, RN Care Management Coordinator [place] Office [phone]

## 2022-02-08 NOTE — Patient Outreach (Signed)
  Care Coordination   Initial Visit Note   02/08/2022 Name: Justin Salinas MRN: [id number] DOB: 08-19-1940  Justin Salinas is a 81 y.o. year old male who sees Vivi Barrack, MD for primary care. I spoke with  Justin Salinas by phone today.  What matters to the patients health and wellness today?  No needs    Goals Addressed               This Visit's Progress     COMPLETED: Not satisfied with BP medication (Amlodipine) (pt-stated)        Care Coordination Interventions: Provided education on importance of blood pressure control in management of CAD Provided education on Importance of limiting foods high in cholesterol Reviewed Importance of taking all medications as prescribed Reviewed Importance of attending all scheduled provider appointments Advised patient to discuss Dr. [name] with provider Screening for signs and symptoms of depression related to chronic disease state Assessed social determinant of health barriers RN case manager will communicate with PCP concerning pt's medication issue  (Amlodipine) 9/7 Pt reports he is "feeling  better" with no additional issues related to his blood pressure medications. Also states he had a recent visit with his provider.         SDOH assessments and interventions completed:  Yes  SDOH Interventions Today    Flowsheet Row Most Recent Value  SDOH Interventions   Food Insecurity Interventions Intervention Not Indicated        Care Coordination Interventions Activated:  Yes  Care Coordination Interventions:  Yes, provided   Follow up plan: No further intervention required.   Encounter Outcome:  Pt. Visit Completed

## 2022-02-12 ENCOUNTER — Ambulatory Visit (INDEPENDENT_AMBULATORY_CARE_PROVIDER_SITE_OTHER): Payer: Medicare HMO

## 2022-02-12 ENCOUNTER — Ambulatory Visit: Payer: Medicare HMO | Admitting: Pharmacist

## 2022-02-12 DIAGNOSIS — E1165 Type 2 diabetes mellitus with hyperglycemia: Secondary | ICD-10-CM

## 2022-02-12 DIAGNOSIS — Z23 Encounter for immunization: Secondary | ICD-10-CM

## 2022-02-12 DIAGNOSIS — E1159 Type 2 diabetes mellitus with other circulatory complications: Secondary | ICD-10-CM

## 2022-02-12 NOTE — Patient Instructions (Addendum)
Visit Information   Goals Addressed             This Visit's Progress    Track and Manage My Blood Pressure-Hypertension   On track    Timeframe:  Long-Range Goal Priority:  High Start Date:   08/08/21                          Expected End Date: 02/08/22                       Follow Up Date 11/08/21    - check blood pressure daily - choose a place to take my blood pressure (home, clinic or office, retail store)    Why is this important?   You won't feel high blood pressure, but it can still hurt your blood vessels.  High blood pressure can cause heart or kidney problems. It can also cause a stroke.  Making lifestyle changes like losing a little weight or eating less salt will help.  Checking your blood pressure at home and at different times of the day can help to control blood pressure.  If the doctor prescribes medicine remember to take it the way the doctor ordered.  Christena Flake the o/ffice if you cannot afford the medicine or if there are questions about it.     Notes:        Patient Care Plan: General Pharmacy (Adult)     Problem Identified: CAD, HTN, GERD, Type II DM, HLD   Priority: High  Onset Date: 08/08/2021     Long-Range Goal: Patient-Specific Goal   Start Date: 08/08/2021  Expected End Date: 02/08/2022  Recent Progress: On track  Priority: High  Note:   Current Barriers:  Recent hypoglycemia  Pharmacist Clinical Goal(s):  Patient will achieve improvement in BP as evidenced by monitoring through collaboration with PharmD and provider.   Interventions: 1:1 collaboration with Vivi Barrack, MD regarding development and update of comprehensive plan of care as evidenced by provider attestation and co-signature Inter-disciplinary care team collaboration (see longitudinal plan of care) Comprehensive medication review performed; medication list updated in electronic medical record  Hypertension (BP goal <130/80) 02/12/22 -Controlled at last in office  reading -Current treatment: Carvedilol 6.'25mg'$  twice daily Appropriate, Query effective, Amlodipine '10mg'$  daily Appropriate, Effective, Safe, Accessible -Medications previously tried: chorthalidone, losartan, lisinopril, hydralazine -Current home readings: unsure, no logs -Denies hypotensive/hypertensive symptoms -Educated on BP goals and benefits of medications for prevention of heart attack, stroke and kidney damage; Daily salt intake goal < 2300 mg; Symptoms of hypotension and importance of maintaining adequate hydration; -Counseled to monitor BP at home daily, document, and provide log at future appointments -He admits to only taking amlodipine when needed previously.  Now reports he is taking daily.  Also is no longer taking hydralazine and reports his kidney doctor started him on carvedilol.  BP close to goal at last office visit.  Monitor for now.  Will have CMA call to check on meds he has at home/adherence.  Hyperlipidemia: (LDL goal < 70) -Controlled -Current treatment: Simvastatin '40mg'$  daily Appropriate, Effective, Safe, Accessible -Medications previously tried: none noted  -Current dietary patterns: see above -Current exercise habits: see above -Educated on Cholesterol goals;  Benefits of statin for ASCVD risk reduction; Importance of limiting foods high in cholesterol; -Recommended to continue current medication LDL at goal < 70 no changes needed  Diabetes (A1c goal <7%) 02/12/22 -Controlled, based on most recent A1c  of 6.7% -Current medications: Repaglinide '1mg'$  BID Appropriate, Effective, Safe, Accessible Novolog 70/30 6 units before breakfast and 8 units at dinner Appropriate, Effective, Safe, Accessible -Medications previously tried: metformin, januvia  -Current home glucose readings fasting glucose: 135 this morning, normally 100-130 post prandial glucose: not checking after meals -Denies hypoglycemic/hyperglycemic symptoms -Current exercise: active around the yard,  goes to the gym 2-3 times per week -Educated on A1c and blood sugar goals; Prevention and management of hypoglycemic episodes; Benefits of routine self-monitoring of blood sugar; -Counseled to check feet daily and get yearly eye exams -Recent episode at the gym where he passed out and went to the emergency department with hypoglycemia.  Unclear why glucose dropped, he remembers eating oatmeal that morning.  After discussion may have been in a hurry and gave too much insulin.  Has not happened since.  Provided tips for accurate dosing.  Managed by endocrine, would not recommend any changes at this time due to one time occurrence.  He does keep emergency glucose source on hand.  Patient Goals/Self-Care Activities Patient will:  - take medications as prescribed as evidenced by patient report and record review check blood pressure daily, document, and provide at future appointments target a minimum of 150 minutes of moderate intensity exercise weekly engage in dietary modifications by limiting salt  Follow Up Plan: The care management team will reach out to the patient again over the next 180 days.           The patient verbalized understanding of instructions, educational materials, and care plan provided today and DECLINED offer to receive copy of patient instructions, educational materials, and care plan.  Telephone follow up appointment with pharmacy team member scheduled for: 6 months  Edythe Clarity, [place], PharmD Clinical Pharmacist  [facility] [phone]

## 2022-03-09 ENCOUNTER — Other Ambulatory Visit: Payer: Self-pay | Admitting: Cardiovascular Disease

## 2022-03-09 ENCOUNTER — Other Ambulatory Visit: Payer: Self-pay | Admitting: Endocrinology

## 2022-03-09 DIAGNOSIS — E1165 Type 2 diabetes mellitus with hyperglycemia: Secondary | ICD-10-CM

## 2022-03-21 DIAGNOSIS — Z961 Presence of intraocular lens: Secondary | ICD-10-CM | POA: Diagnosis not present

## 2022-03-21 DIAGNOSIS — H35352 Cystoid macular degeneration, left eye: Secondary | ICD-10-CM | POA: Diagnosis not present

## 2022-03-27 ENCOUNTER — Other Ambulatory Visit: Payer: Self-pay | Admitting: Endocrinology

## 2022-06-27 ENCOUNTER — Ambulatory Visit (HOSPITAL_COMMUNITY)
Admission: RE | Admit: 2022-06-27 | Payer: Medicare HMO | Source: Ambulatory Visit | Attending: Physician Assistant | Admitting: Physician Assistant

## 2022-06-28 ENCOUNTER — Telehealth: Payer: Self-pay | Admitting: Pharmacist

## 2022-06-28 NOTE — Progress Notes (Signed)
Erroneous Encounter

## 2022-07-02 ENCOUNTER — Telehealth: Payer: Self-pay | Admitting: Family Medicine

## 2022-07-02 NOTE — Telephone Encounter (Signed)
Copied from [place] [phone]. Topic: Medicare AWV >> Jul 02, 2022 12:14 PM Gillis Santa wrote: Reason for CRM: LVM PATIENT TO CALL BACK  NEEDS TO R/S AWV WITH HEALTH COACH TINA IN OFFICE OR TELE VISIT [phone]

## 2022-07-05 ENCOUNTER — Other Ambulatory Visit (HOSPITAL_COMMUNITY): Payer: Self-pay | Admitting: Physician Assistant

## 2022-07-05 DIAGNOSIS — I6523 Occlusion and stenosis of bilateral carotid arteries: Secondary | ICD-10-CM

## 2022-07-12 ENCOUNTER — Ambulatory Visit (HOSPITAL_COMMUNITY): Admission: RE | Admit: 2022-07-12 | Payer: Medicare HMO | Source: Ambulatory Visit

## 2022-07-12 ENCOUNTER — Encounter (HOSPITAL_COMMUNITY): Payer: Self-pay

## 2022-07-16 ENCOUNTER — Emergency Department (HOSPITAL_BASED_OUTPATIENT_CLINIC_OR_DEPARTMENT_OTHER): Payer: Medicare HMO

## 2022-07-16 ENCOUNTER — Emergency Department (HOSPITAL_BASED_OUTPATIENT_CLINIC_OR_DEPARTMENT_OTHER)
Admission: EM | Admit: 2022-07-16 | Discharge: 2022-07-16 | Disposition: A | Discharge status: Home / Self Care | Payer: Medicare HMO | Attending: Emergency Medicine | Admitting: Emergency Medicine

## 2022-07-16 ENCOUNTER — Other Ambulatory Visit: Payer: Self-pay

## 2022-07-16 ENCOUNTER — Telehealth: Payer: Self-pay | Admitting: Family Medicine

## 2022-07-16 ENCOUNTER — Encounter (HOSPITAL_BASED_OUTPATIENT_CLINIC_OR_DEPARTMENT_OTHER): Payer: Self-pay | Admitting: Emergency Medicine

## 2022-07-16 ENCOUNTER — Emergency Department (HOSPITAL_COMMUNITY): Payer: Medicare HMO

## 2022-07-16 DIAGNOSIS — Z7982 Long term (current) use of aspirin: Secondary | ICD-10-CM | POA: Diagnosis not present

## 2022-07-16 DIAGNOSIS — N189 Chronic kidney disease, unspecified: Secondary | ICD-10-CM | POA: Diagnosis not present

## 2022-07-16 DIAGNOSIS — R269 Unspecified abnormalities of gait and mobility: Secondary | ICD-10-CM | POA: Diagnosis not present

## 2022-07-16 DIAGNOSIS — Z79899 Other long term (current) drug therapy: Secondary | ICD-10-CM | POA: Diagnosis not present

## 2022-07-16 DIAGNOSIS — W19XXXA Unspecified fall, initial encounter: Secondary | ICD-10-CM | POA: Insufficient documentation

## 2022-07-16 DIAGNOSIS — Z043 Encounter for examination and observation following other accident: Secondary | ICD-10-CM | POA: Diagnosis not present

## 2022-07-16 DIAGNOSIS — F0390 Unspecified dementia without behavioral disturbance: Secondary | ICD-10-CM | POA: Diagnosis not present

## 2022-07-16 DIAGNOSIS — R2681 Unsteadiness on feet: Secondary | ICD-10-CM

## 2022-07-16 DIAGNOSIS — R4182 Altered mental status, unspecified: Secondary | ICD-10-CM | POA: Insufficient documentation

## 2022-07-16 DIAGNOSIS — Z1152 Encounter for screening for COVID-19: Secondary | ICD-10-CM | POA: Insufficient documentation

## 2022-07-16 DIAGNOSIS — I129 Hypertensive chronic kidney disease with stage 1 through stage 4 chronic kidney disease, or unspecified chronic kidney disease: Secondary | ICD-10-CM | POA: Insufficient documentation

## 2022-07-16 LAB — COMPREHENSIVE METABOLIC PANEL
ALT: 14 U/L (ref 0–44)
AST: 15 U/L (ref 15–41)
Albumin: 3.8 g/dL (ref 3.5–5.0)
Alkaline Phosphatase: 84 U/L (ref 38–126)
Anion gap: 7 (ref 5–15)
BUN: 29 mg/dL — ABNORMAL HIGH (ref 8–23)
CO2: 26 mmol/L (ref 22–32)
Calcium: 9.1 mg/dL (ref 8.9–10.3)
Chloride: 101 mmol/L (ref 98–111)
Creatinine, Ser: 1.59 mg/dL — ABNORMAL HIGH (ref 0.61–1.24)
GFR, Estimated: 43 mL/min — ABNORMAL LOW (ref >60)
Glucose, Bld: 450 mg/dL — ABNORMAL HIGH (ref 70–99)
Potassium: 4.6 mmol/L (ref 3.5–5.1)
Sodium: 134 mmol/L — ABNORMAL LOW (ref 135–145)
Total Bilirubin: 0.5 mg/dL (ref 0.3–1.2)
Total Protein: 6.8 g/dL (ref 6.5–8.1)

## 2022-07-16 LAB — RESP PANEL BY RT-PCR (RSV, FLU A&B, COVID)  RVPGX2
Influenza A by PCR: NEGATIVE
Influenza B by PCR: NEGATIVE
Resp Syncytial Virus by PCR: NEGATIVE
SARS Coronavirus 2 by RT PCR: NEGATIVE

## 2022-07-16 LAB — URINALYSIS, ROUTINE W REFLEX MICROSCOPIC
Bacteria, UA: NONE SEEN
Bilirubin Urine: NEGATIVE
Glucose, UA: >1000 mg/dL — AB
Hgb urine dipstick: NEGATIVE
Ketones, ur: NEGATIVE mg/dL
Leukocytes,Ua: NEGATIVE
Nitrite: NEGATIVE
Protein, ur: NEGATIVE mg/dL
Specific Gravity, Urine: 1.027 (ref 1.005–1.030)
pH: 5.5 (ref 5.0–8.0)

## 2022-07-16 LAB — CBG MONITORING, ED
Glucose-Capillary: 424 mg/dL — ABNORMAL HIGH (ref 70–99)
Glucose-Capillary: 288 mg/dL — ABNORMAL HIGH (ref 70–99)
Glucose-Capillary: 158 mg/dL — ABNORMAL HIGH (ref 70–99)

## 2022-07-16 LAB — CBC WITH DIFFERENTIAL/PLATELET
Abs Immature Granulocytes: 0.03 10*3/uL (ref 0.00–0.07)
Basophils Absolute: 0 10*3/uL (ref 0.0–0.1)
Basophils Relative: 1 %
Eosinophils Absolute: 0.3 10*3/uL (ref 0.0–0.5)
Eosinophils Relative: 4 %
HCT: 38.6 % — ABNORMAL LOW (ref 39.0–52.0)
Hemoglobin: 13.2 g/dL (ref 13.0–17.0)
Immature Granulocytes: 0 %
Lymphocytes Relative: 18 %
Lymphs Abs: 1.3 10*3/uL (ref 0.7–4.0)
MCH: 32.1 pg (ref 26.0–34.0)
MCHC: 34.2 g/dL (ref 30.0–36.0)
MCV: 93.9 fL (ref 80.0–100.0)
Monocytes Absolute: 0.4 10*3/uL (ref 0.1–1.0)
Monocytes Relative: 5 %
Neutro Abs: 5.3 10*3/uL (ref 1.7–7.7)
Neutrophils Relative %: 72 %
Platelets: 163 10*3/uL (ref 150–400)
RBC: 4.11 MIL/uL — ABNORMAL LOW (ref 4.22–5.81)
RDW: 12.8 % (ref 11.5–15.5)
WBC: 7.3 10*3/uL (ref 4.0–10.5)
nRBC: 0 % (ref 0.0–0.2)

## 2022-07-16 LAB — AMMONIA: Ammonia: 31 umol/L (ref 9–35)

## 2022-07-16 MED ORDER — INSULIN ASPART PROT & ASPART (70-30 MIX) 100 UNIT/ML PEN: 8.0000 [IU] | PEN_INJECTOR | Freq: Two times a day (BID) | SUBCUTANEOUS | Status: DC

## 2022-07-16 MED ORDER — INSULIN ASPART 100 UNIT/ML IJ SOLN
8.0000 [IU] | Freq: Once | INTRAMUSCULAR | Status: AC
  Administered 2022-07-16: 8 [IU] via SUBCUTANEOUS

## 2022-07-16 MED ORDER — LACTATED RINGERS IV BOLUS
500.0000 mL | Freq: Once | INTRAVENOUS | Status: AC
  Administered 2022-07-16: 500 mL via INTRAVENOUS

## 2022-07-16 MED ORDER — LORAZEPAM 1 MG PO TABS: 0.5000 mg | ORAL_TABLET | ORAL | Status: DC | PRN

## 2022-07-16 MED ORDER — LORAZEPAM 1 MG PO TABS: 1.0000 mg | ORAL_TABLET | ORAL | Status: DC | PRN

## 2022-07-16 NOTE — ED Notes (Signed)
Report received from [facility]. 8 units of insulin en-route w EMS as pt's CBG was over 400. Pt here for MRI.

## 2022-07-16 NOTE — ED Notes (Signed)
Called [facility] -- informed patient Ed to Ed tx via [facility]; accepting MD, [employer]

## 2022-07-16 NOTE — ED Provider Notes (Addendum)
Patient turned over to me awaiting MRI.  Patient was transferred in for concerns for cerebellum infarct.  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  Based on this patient could be discharged home.  However family raise some concerns about him not being able to care for himself.  Will discuss with family.   Fredia Sorrow, MD 07/16/22 1651   Will check patient's fingerstick blood sugar prior to discharge.  Patient had blood sugar this morning at 424 no evidence of any acidosis.  Did have a repeat at 1451 that was 288.  Will just check it again prior to discharge.  Discussed with Dr. Leonel Ramsay on for neurology patient cleared for outpatient follow-up with neurology.  Will give patient's wife information about care for neurology as well as [facility].   Fredia Sorrow, MD 07/16/22 [phone]

## 2022-07-16 NOTE — ED Provider Notes (Signed)
[facility] Provider Note   CSN: [id number] Arrival date & time: 07/16/22  1053     History  Chief Complaint  Patient presents with   Fall   Altered Mental Status    Justin Salinas is a 82 y.o. male.   Fall  Altered Mental Status    82 year old male with medical history significant for diabetes mellitus, HLD, HTN, CAD, GERD, CKD, carotid stenosis who presents to the emergency department with altered mental status after a fall.  Patient history of present illness was provided by the patient's daughter who states that he had an unwitnessed fall 1 week ago.  His daughter went to check on him on Friday and he told her about the fall.  She states that he is not at his baseline mental status which is normally alert and oriented x 3.  She states that he has had mild symptoms of dementia over the last few weeks but has had an acute decline that she has noticed over the last few days.  He is unsteady, mild incontinence, not at his baseline.  No facial droop, focal numbness or weakness. No speech abnormality. No abdominal pain, new back pain, does endorse chronic low back pain.  Daughters states that he is not taking care of himself anymore.  She questions whether he is taking his medications. He is not on anticoagulation.   Home Medications Prior to Admission medications   Medication Sig Start Date End Date Taking? Authorizing Provider  ACCU-CHEK GUIDE test strip TEST BLOOD SUGAR TWICE DAILY 12/18/21   Elayne Snare, MD  alfuzosin (UROXATRAL) 10 MG 24 hr tablet Take 10 mg by mouth daily with breakfast.    [provider]  amLODipine (NORVASC) 10 MG tablet TAKE 1 TABLET EVERY DAY 11/22/21   [name], MD  Ascorbic Acid (VITAMIN C) 1000 MG tablet Take 1,000 mg by mouth daily.    [provider]  aspirin 81 MG tablet Take 81 mg by mouth daily.    [provider]  azelastine (ASTELIN) 0.1 % nasal spray Place 2 sprays  into both nostrils 2 (two) times daily. 09/19/21   Vivi Barrack, MD  carvedilol (COREG) 6.25 MG tablet Take 6.25 mg by mouth 2 (two) times daily with a meal.    [provider]  furosemide (LASIX) 40 MG tablet Take 40 mg by mouth 2 (two) times daily.    [provider]  Glucosamine-Chondroit-Vit C-Mn (GLUCOSAMINE 1500 COMPLEX) CAPS Take by mouth.    [provider]  glucose blood (ACCU-CHEK GUIDE) test strip Use to check blood sugars 2 times daily 09/05/18   Elayne Snare, MD  Insulin Pen Needle (DROPLET PEN NEEDLES) 31G X 5 MM MISC USE AS DIRECTED SUBCUTANEOUSLY TWO TIMES DAILY 03/09/22   Elayne Snare, MD  Lancets Misc. (ACCU-CHEK SOFTCLIX LANCET DEV) KIT  08/21/13   [provider]  Multiple Vitamins-Minerals (CENTRUM SILVER ADULT 50+ PO) Take by mouth.    [provider]  NOVOLOG MIX 70/30 FLEXPEN (70-30) 100 UNIT/ML FlexPen INJECT 8 UNITS UNDER THE SKIN BEFORE BREAKFAST AND 8 UNITS AT DINNER. DISCARD PEN 14 DAYS AFTER OPENING 03/29/22   Elayne Snare, MD  Omega-3 1400 MG CAPS Take 1,400 mg by mouth daily.    [provider]  pregabalin (LYRICA) 25 MG capsule Take 1 capsule (25 mg total) by mouth 2 (two) times daily. 08/22/21   Vivi Barrack, MD  repaglinide (PRANDIN) 1 MG tablet TAKE ONE TABLET  BEFORE BREAKFAST AND LUNCH 10/16/21   Elayne Snare, MD  simvastatin (ZOCOR) 40 MG tablet Take 1 tablet (40 mg total) by mouth daily. 12/08/21   [name], MD  sodium chloride (OCEAN) 0.65 % SOLN nasal spray Place 1 spray into both nostrils as needed for congestion.    [provider]  TRUEplus Lancets 28G MISC TEST BLOOD SUGAR TWICE DAILY 07/25/21   Elayne Snare, MD      Allergies    Exenatide, Linagliptin, and Lasix [furosemide]    Review of Systems   Review of Systems  Unable to perform ROS: Mental status change    Physical Exam Updated Vital Signs BP (!) 178/81   Pulse 69   Temp 98.7 F (37.1 C) (Rectal)   Resp (!) 22   Ht 5' 9"$   (1.753 m)   Wt 70.3 kg   SpO2 95%   BMI 22.89 kg/m  Physical Exam Vitals and nursing note reviewed.  Constitutional:      Appearance: He is well-developed.     Comments: GCS 15, ABC intact  HENT:     Head: Normocephalic.  Eyes:     Conjunctiva/sclera: Conjunctivae normal.  Neck:     Comments: No midline tenderness to palpation of the cervical spine. ROM intact. Cardiovascular:     Rate and Rhythm: Normal rate and regular rhythm.  Pulmonary:     Effort: Pulmonary effort is normal. No respiratory distress.     Breath sounds: Normal breath sounds.  Chest:     Comments: Chest wall stable and non-tender to AP and lateral compression. Clavicles stable and non-tender to AP compression Abdominal:     Palpations: Abdomen is soft.     Tenderness: There is no abdominal tenderness.     Comments: Pelvis stable to lateral compression.  Musculoskeletal:     Cervical back: Neck supple.     Comments: No midline tenderness to palpation of the thoracic spine, mild midline lumbar TTP. Extremities atraumatic with intact ROM.   Skin:    General: Skin is warm and dry.  Neurological:     Mental Status: He is alert.     Comments: MENTAL STATUS EXAM:    Orientation: Alert and oriented to person, place. DISORIENTED TO YEAR. Memory: Cooperative, some trouble following commands. Language: Speech is clear and language is normal.   CRANIAL NERVES:    CN 2 (Optic): Visual fields intact to confrontation.  CN 3,4,6 (EOM): Pupils equal and reactive to light. Full extraocular eye movement without nystagmus.  CN 5 (Trigeminal): Facial sensation is normal, no weakness of masticatory muscles.  CN 7 (Facial): No facial weakness or asymmetry.  CN 8 (Auditory): Auditory acuity grossly normal.  CN 9,10 (Glossophar): The uvula is midline, the palate elevates symmetrically.  CN 11 (spinal access): Normal sternocleidomastoid and trapezius strength.  CN 12 (Hypoglossal): The tongue is midline. No atrophy or  fasciculations.Marland Kitchen   MOTOR:  Muscle Strength: 5/5RUE, 5/5LUE, 5/5RLE, 5/5LLE.   COORDINATION:   Intact finger-to-nose with some mild bilateral dysmetria, no tremor..   SENSATION:   Intact to light touch all four extremities.        ED Results / Procedures / Treatments   Labs (all labs ordered are listed, but only abnormal results are displayed) Labs Reviewed  CBC WITH DIFFERENTIAL/PLATELET - Abnormal; Notable for the following components:      Result Value   RBC 4.11 (*)    HCT 38.6 (*)    All other components within normal limits  COMPREHENSIVE METABOLIC  PANEL - Abnormal; Notable for the following components:   Sodium 134 (*)    Glucose, Bld 450 (*)    BUN 29 (*)    Creatinine, Ser 1.59 (*)    GFR, Estimated 43 (*)    All other components within normal limits  URINALYSIS, ROUTINE W REFLEX MICROSCOPIC - Abnormal; Notable for the following components:   Glucose, UA >1,000 (*)    All other components within normal limits  CBG MONITORING, ED - Abnormal; Notable for the following components:   Glucose-Capillary 424 (*)    All other components within normal limits  RESP PANEL BY RT-PCR (RSV, FLU A&B, COVID)  [id number]  AMMONIA    EKG None  Radiology DG Chest Portable 1 View  Result Date: 07/16/2022 CLINICAL DATA:  Fall, altered mental status. EXAM: PORTABLE CHEST 1 VIEW COMPARISON:  01/31/2022. FINDINGS: Trachea is midline. Heart size stable. Thoracic aorta is calcified. Lungs are clear. No pleural fluid. Old left ninth rib fracture. IMPRESSION: No acute findings. Electronically Signed   By: Lorin Picket M.D.   On: 07/16/2022 12:45   CT Lumbar Spine Wo Contrast  Result Date: 07/16/2022 CLINICAL DATA:  Unwitnessed fall last week EXAM: CT LUMBAR SPINE WITHOUT CONTRAST TECHNIQUE: Multidetector CT imaging of the lumbar spine was performed without intravenous contrast administration. Multiplanar CT image reconstructions were also generated. RADIATION DOSE REDUCTION: This exam was  performed according to the departmental dose-optimization program which includes automated exposure control, adjustment of the mA and/or kV according to patient size and/or use of iterative reconstruction technique. COMPARISON:  MR lumbar spine, 03/05/2020 FINDINGS: Segmentation: 5 lumbar type vertebrae. Alignment: Degenerative straightening of the normal lumbar lordosis. Vertebrae: No fracture or location. Paraspinal and other soft tissues: Unremarkable. Disc levels: Severe multilevel disc space height loss and osteophytosis throughout the lumbar spine, with preservation of the L4-L5 disc space and discectomy and posterior fusion of L5-S1 IMPRESSION: 1. No fracture or dislocation of the lumbar spine. 2. Severe multilevel disc space height loss and osteophytosis throughout the lumbar spine, with preservation of the L4-L5 disc space and discectomy and posterior fusion of L5-S1. Electronically Signed   By: Delanna Ahmadi M.D.   On: 07/16/2022 12:18   CT Head Wo Contrast  Result Date: 07/16/2022 CLINICAL DATA:  Unwitnessed fall last week EXAM: CT HEAD WITHOUT CONTRAST CT CERVICAL SPINE WITHOUT CONTRAST TECHNIQUE: Multidetector CT imaging of the head and cervical spine was performed following the standard protocol without intravenous contrast. Multiplanar CT image reconstructions of the cervical spine were also generated. RADIATION DOSE REDUCTION: This exam was performed according to the departmental dose-optimization program which includes automated exposure control, adjustment of the mA and/or kV according to patient size and/or use of iterative reconstruction technique. COMPARISON:  None Available. FINDINGS: CT HEAD FINDINGS Brain: No evidence of acute infarction, hemorrhage, hydrocephalus, extra-axial collection or mass lesion/mass effect. Periventricular and deep white matter hypodensity. Vascular: No hyperdense vessel or unexpected calcification. Skull: Normal. Negative for fracture or focal lesion.  Sinuses/Orbits: No acute finding. Chronic bony sinus wall and mucosal thickening of the left maxillary sinus, partially imaged. Other: None. CT CERVICAL SPINE FINDINGS Alignment: Normal. Skull base and vertebrae: No acute fracture. No primary bone lesion or focal pathologic process. Soft tissues and spinal canal: No prevertebral fluid or swelling. No visible canal hematoma. Disc levels: Mild to moderate multilevel disc space height loss and osteophytosis, worst at C6-C7. Upper chest: Negative. Other: None. IMPRESSION: 1. No acute intracranial pathology. Small-vessel white matter disease. 2. No fracture or  static subluxation of the cervical spine. 3. Mild to moderate multilevel cervical disc degenerative disease. 4. Chronic changes of left maxillary sinusitis. Electronically Signed   By: Delanna Ahmadi M.D.   On: 07/16/2022 12:10   CT CERVICAL SPINE WO CONTRAST  Result Date: 07/16/2022 CLINICAL DATA:  Unwitnessed fall last week EXAM: CT HEAD WITHOUT CONTRAST CT CERVICAL SPINE WITHOUT CONTRAST TECHNIQUE: Multidetector CT imaging of the head and cervical spine was performed following the standard protocol without intravenous contrast. Multiplanar CT image reconstructions of the cervical spine were also generated. RADIATION DOSE REDUCTION: This exam was performed according to the departmental dose-optimization program which includes automated exposure control, adjustment of the mA and/or kV according to patient size and/or use of iterative reconstruction technique. COMPARISON:  None Available. FINDINGS: CT HEAD FINDINGS Brain: No evidence of acute infarction, hemorrhage, hydrocephalus, extra-axial collection or mass lesion/mass effect. Periventricular and deep white matter hypodensity. Vascular: No hyperdense vessel or unexpected calcification. Skull: Normal. Negative for fracture or focal lesion. Sinuses/Orbits: No acute finding. Chronic bony sinus wall and mucosal thickening of the left maxillary sinus, partially  imaged. Other: None. CT CERVICAL SPINE FINDINGS Alignment: Normal. Skull base and vertebrae: No acute fracture. No primary bone lesion or focal pathologic process. Soft tissues and spinal canal: No prevertebral fluid or swelling. No visible canal hematoma. Disc levels: Mild to moderate multilevel disc space height loss and osteophytosis, worst at C6-C7. Upper chest: Negative. Other: None. IMPRESSION: 1. No acute intracranial pathology. Small-vessel white matter disease. 2. No fracture or static subluxation of the cervical spine. 3. Mild to moderate multilevel cervical disc degenerative disease. 4. Chronic changes of left maxillary sinusitis. Electronically Signed   By: Delanna Ahmadi M.D.   On: 07/16/2022 12:10    Procedures Procedures    Medications Ordered in ED Medications  lactated ringers bolus 500 mL (has no administration in time range)  insulin aspart (novoLOG) injection 8 Units (has no administration in time range)    ED Course/ Medical Decision Making/ A&P Clinical Course as of 07/16/22 1258  Mon Jul 16, 2022  1232 BP(!): 203/82 [JL]    Clinical Course User Index [JL] Regan Lemming, MD                             Medical Decision Making Amount and/or Complexity of Data Reviewed Labs: ordered. Radiology: ordered.     82 year old male with medical history significant for diabetes mellitus, HLD, HTN, CAD, GERD, CKD, carotid stenosis who presents to the emergency department with altered mental status after a fall.  Patient history of present illness was provided by the patient's daughter who states that he had an unwitnessed fall 1 week ago.  His daughter went to check on him on Friday and he told her about the fall.  She states that he is not at his baseline mental status which is normally alert and oriented x 3.  She states that he has had mild symptoms of dementia over the last few weeks but has had an acute decline that she has noticed over the last few days.  He is unsteady,  mild incontinence, not at his baseline.  No facial droop, focal numbness or weakness. No speech abnormality. No abdominal pain, new back pain, does endorse chronic low back pain.  Daughters states that he is not taking care of himself anymore.  She questions whether he is taking his medications. He is not on anticoagulation.   On arrival,  the patient was afebrile, not tachycardic or tachypneic, hypertensive BP 203/82, saturating 96% on room air.  Physical exam significant for abdomen soft, nontender, nondistended, mild tenderness of the lower lumbar spine, mild bilateral dysmetria, otherwise no acute neurologic dysfunction on exam.   Differential diagnosis includes subacute to acute CVA, fall and subsequent subdural hematoma or other intracranial hemorrhage, acute fracture, electrolyte abnormality, infectious etiology of the patient's mental status change versus urinary tract infection, pneumonia.  Considered normal pressure hydrocephalus, carotid stenosis, hypertensive encephalopathy given the patient's initial blood pressure of 203/82.  Initial laboratory workup significant for hyperglycemia with a CBG of 424, no evidence of DKA with a CMP with a bicarbonate of 26 and an anion gap of 7.  Renal function appears to be at baseline for the patient CKD with a creatinine of 1.59.  The patient was persistently hyperglycemic to 450 on his CMP.  His CBC was without leukocytosis or anemia, ammonia was normal, urinalysis with glucosuria, negative for UTI, COVID-19 and influenza PCR testing was ordered.  Imaging:  CT head and cervical spine: IMPRESSION:  1. No acute intracranial pathology. Small-vessel white matter  disease.  2. No fracture or static subluxation of the cervical spine.  3. Mild to moderate multilevel cervical disc degenerative disease.  4. Chronic changes of left maxillary sinusitis.   CT Lumbar Spine: IMPRESSION:  1. No fracture or dislocation of the lumbar spine.  2. Severe multilevel  disc space height loss and osteophytosis  throughout the lumbar spine, with preservation of the L4-L5 disc  space and discectomy and posterior fusion of L5-S1.    CXR: No acute abnormality.  The patient's hyperglycemia, asked if he had taken his home insulin which he had not this morning.  His home insulin was reordered.  Small IV fluid bolus was also ordered.    Discussed the care of the patient with on-call neurology, Dr. Leonel Ramsay who recommended MRI of the brain for further evaluation to rule out posterior CVA that could be subacute.  If concerned for normal pressure hydrocephalus, patient could be referred outpatient to [facility] neurosurgery [facility] clinic for outpatient evaluation.  Discussed with Dr. Matilde Sprang of the [facility] emergency department who accepted the patient in ER to ER transfer for MRI Brain.  Patient may benefit from placement if no indication for admission is found as per his daughter, he is no longer taking care of his ADLs. [facility] contacted for transfer.  Final Clinical Impression(s) / ED Diagnoses Final diagnoses:  Gait instability  Fall, initial encounter  Altered mental status, unspecified altered mental status type    Rx / DC Orders ED Discharge Orders     None         Regan Lemming, MD 07/16/22 1258

## 2022-07-16 NOTE — Discharge Instructions (Addendum)
MRI shows no evidence of an acute stroke.  There is evidence of some small remote strokes.  Neurology recommending follow-up with outpatient neurology.  Since you do not want to follow-up with [facility] anymore.  Gave you information to call either [place] neurology or [facility] neurology.

## 2022-07-16 NOTE — Telephone Encounter (Signed)
Pt was advised to call EMS  Patient Name: Justin Salinas Gender: Male DOB: 1940/09/29 Age: 82 Y 11 M 10 D Return Phone Number: [phone] (Primary) Address: City/ State/ Zip: [place]  [address] Client [facility] [place] Client [facility] Contact Type Call Who Is Calling Patient / Member / Family / Caregiver Call Type Triage / Clinical Caller Name Ardis Rowan Relationship To Patient Daughter Return Phone Number [phone] (Primary) Chief Complaint CONFUSION - new onset Reason for Call Symptomatic / Request for [place] states that her father hit his head and he is wobbly, weak , not steady on his feet, and he is also in a states of confusion, and she feels as if he has not been taking his medication due to the confusion. She thinks he hit his head maybe Monday or Tuesday. His doctors name is Dr. [name], [place]. Translation No Nurse Assessment Nurse: Nicole Cella, RN, [place] Date/Time (Eastern Time): 07/16/2022 8:24:48 AM Confirm and document reason for call. If symptomatic, describe symptoms. ---Caller states that her father hit his head and is weak , unsteady on his feet, and he is also in a states of confusion, and she feels as if he has not been taking his medication due to the confusion. She thinks he hit his head maybe Monday or Tuesday., lives alone so not sure when sx happened. Does the patient have any new or worsening symptoms? ---Yes Will a triage be completed? ---Yes Related visit to physician within the last 2 weeks? ---No Does the PT have any chronic conditions? (i.e. diabetes, asthma, this includes High risk factors for pregnancy, etc.) ---Yes List chronic conditions. ---DM heart disease kidney disease -- unsure Is this a behavioral health or substance abuse call? ---No Guidelines Guideline Title Affirmed Question Affirmed Notes Nurse  Date/Time (Eastern Time) Head Injury [1] ACUTE NEURO SYMPTOM AND [2] present now (DEFINITION: difficult to awaken OR confused thinking and talking OR slurred speech OR weakness of arms OR unsteady walking) [place], RN, [place] 07/16/2022 8:26:32 AM Disp. Time Eilene Ghazi Time) Disposition Final User 07/16/2022 8:23:40 AM Send to Urgent Queue Minna Merritts 07/16/2022 8:27:18 AM Call EMS 911 Now Yes [place], RN, [place] 07/16/2022 8:28:11 AM 911 Outcome Documentation [place], RN, [place] Reason: After 911 dispo, caller reveals that she is not currently with her father(patient) and needs to go over to his house, advised to leave ASAP and call them on the way Final Disposition 07/16/2022 8:27:18 AM Call EMS 911 Now Yes [place], RN, [place] Caller Disagree/Comply Comply Caller Understands Yes PreDisposition 911 Care Advice Given Per Guideline CALL EMS 911 NOW: * Immediate medical attention is needed. You need to hang up and call 911 (or an ambulance). * Triager Discretion: I'll call you back in a few minutes to be sure you were able to reach them. CARE ADVICE given per Head Injury (Adult) guideline.

## 2022-07-16 NOTE — Telephone Encounter (Signed)
Patient's daughter Edd Fabian states Patient fell early last week and hit his head. States Patient did not tell anyone. States she went to see him 07/14/22 and states Patient was and is in a total state of confusion, wobbly, weak, not steady on his feet, incontinence, has not been taking his medications due to confusion.   Transferred to Triage.

## 2022-07-16 NOTE — ED Triage Notes (Signed)
Pt arrives to ED with c/o fall and altered mental status. Per family member pt had a unwitnessed fall last week. Pt and family member unsure of what day. Family member notes that over the past week pt has been confused. Family members notes he takes his insulin at home daily and has not been taking his insulin correctly.

## 2022-07-19 ENCOUNTER — Telehealth: Payer: Self-pay

## 2022-07-19 NOTE — Patient Outreach (Signed)
  Care Coordination   07/19/2022 Name: Justin Salinas MRN: [id number] DOB: 1940-09-23   Care Coordination Outreach Attempts:  An unsuccessful telephone outreach was attempted today to offer the patient information about available care coordination services as a benefit of their health plan.   Follow Up Plan:  Additional outreach attempts will be made to offer the patient care coordination information and services.   Encounter Outcome:  No Answer   Care Coordination Interventions:  No, not indicated    Daneen Schick, BSW, CDP Social Worker, Certified Dementia Practitioner [facility]  Care Coordination [phone]

## 2022-07-20 ENCOUNTER — Telehealth: Payer: Self-pay

## 2022-07-20 NOTE — Telephone Encounter (Signed)
Patient's daughter called regarding her father. States that he has been having difficulty with checking blood sugars as well as administering his insulin. Would like to inquire about a insulin pump and CGM. Upcoming appt on 2/23

## 2022-07-23 ENCOUNTER — Emergency Department (HOSPITAL_BASED_OUTPATIENT_CLINIC_OR_DEPARTMENT_OTHER): Payer: Medicare HMO

## 2022-07-23 ENCOUNTER — Emergency Department (HOSPITAL_BASED_OUTPATIENT_CLINIC_OR_DEPARTMENT_OTHER): Payer: Medicare HMO | Admitting: Radiology

## 2022-07-23 ENCOUNTER — Other Ambulatory Visit (HOSPITAL_BASED_OUTPATIENT_CLINIC_OR_DEPARTMENT_OTHER): Payer: Self-pay

## 2022-07-23 ENCOUNTER — Other Ambulatory Visit: Payer: Self-pay

## 2022-07-23 ENCOUNTER — Encounter (HOSPITAL_BASED_OUTPATIENT_CLINIC_OR_DEPARTMENT_OTHER): Payer: Self-pay | Admitting: Emergency Medicine

## 2022-07-23 ENCOUNTER — Inpatient Hospital Stay (HOSPITAL_BASED_OUTPATIENT_CLINIC_OR_DEPARTMENT_OTHER)
Admission: EM | Admit: 2022-07-23 | Discharge: 2022-07-27 | DRG: 092 | Disposition: A | Discharge status: Skilled Nursing Facility | Payer: Medicare HMO | Attending: Internal Medicine | Admitting: Internal Medicine

## 2022-07-23 DIAGNOSIS — W19XXXA Unspecified fall, initial encounter: Secondary | ICD-10-CM

## 2022-07-23 DIAGNOSIS — I2542 Coronary artery dissection: Secondary | ICD-10-CM | POA: Diagnosis not present

## 2022-07-23 DIAGNOSIS — N1832 Chronic kidney disease, stage 3b: Secondary | ICD-10-CM | POA: Diagnosis present

## 2022-07-23 DIAGNOSIS — Z83719 Family history of colon polyps, unspecified: Secondary | ICD-10-CM

## 2022-07-23 DIAGNOSIS — R4182 Altered mental status, unspecified: Secondary | ICD-10-CM | POA: Diagnosis not present

## 2022-07-23 DIAGNOSIS — Y92009 Unspecified place in unspecified non-institutional (private) residence as the place of occurrence of the external cause: Secondary | ICD-10-CM | POA: Diagnosis not present

## 2022-07-23 DIAGNOSIS — E1165 Type 2 diabetes mellitus with hyperglycemia: Secondary | ICD-10-CM | POA: Diagnosis not present

## 2022-07-23 DIAGNOSIS — D509 Iron deficiency anemia, unspecified: Secondary | ICD-10-CM | POA: Diagnosis not present

## 2022-07-23 DIAGNOSIS — Z1152 Encounter for screening for COVID-19: Secondary | ICD-10-CM

## 2022-07-23 DIAGNOSIS — Z7982 Long term (current) use of aspirin: Secondary | ICD-10-CM

## 2022-07-23 DIAGNOSIS — R4189 Other symptoms and signs involving cognitive functions and awareness: Secondary | ICD-10-CM | POA: Diagnosis not present

## 2022-07-23 DIAGNOSIS — J45909 Unspecified asthma, uncomplicated: Secondary | ICD-10-CM | POA: Diagnosis present

## 2022-07-23 DIAGNOSIS — Z8249 Family history of ischemic heart disease and other diseases of the circulatory system: Secondary | ICD-10-CM

## 2022-07-23 DIAGNOSIS — Z8049 Family history of malignant neoplasm of other genital organs: Secondary | ICD-10-CM

## 2022-07-23 DIAGNOSIS — Z888 Allergy status to other drugs, medicaments and biological substances status: Secondary | ICD-10-CM

## 2022-07-23 DIAGNOSIS — K219 Gastro-esophageal reflux disease without esophagitis: Secondary | ICD-10-CM | POA: Diagnosis present

## 2022-07-23 DIAGNOSIS — R2689 Other abnormalities of gait and mobility: Secondary | ICD-10-CM | POA: Diagnosis present

## 2022-07-23 DIAGNOSIS — Z833 Family history of diabetes mellitus: Secondary | ICD-10-CM

## 2022-07-23 DIAGNOSIS — I5189 Other ill-defined heart diseases: Secondary | ICD-10-CM

## 2022-07-23 DIAGNOSIS — I639 Cerebral infarction, unspecified: Secondary | ICD-10-CM | POA: Diagnosis not present

## 2022-07-23 DIAGNOSIS — E785 Hyperlipidemia, unspecified: Secondary | ICD-10-CM | POA: Diagnosis present

## 2022-07-23 DIAGNOSIS — I2510 Atherosclerotic heart disease of native coronary artery without angina pectoris: Secondary | ICD-10-CM | POA: Diagnosis not present

## 2022-07-23 DIAGNOSIS — I160 Hypertensive urgency: Principal | ICD-10-CM | POA: Diagnosis present

## 2022-07-23 DIAGNOSIS — I152 Hypertension secondary to endocrine disorders: Secondary | ICD-10-CM | POA: Diagnosis present

## 2022-07-23 DIAGNOSIS — R4181 Age-related cognitive decline: Secondary | ICD-10-CM | POA: Diagnosis not present

## 2022-07-23 DIAGNOSIS — S0993XA Unspecified injury of face, initial encounter: Secondary | ICD-10-CM | POA: Diagnosis not present

## 2022-07-23 DIAGNOSIS — Z794 Long term (current) use of insulin: Secondary | ICD-10-CM | POA: Diagnosis not present

## 2022-07-23 DIAGNOSIS — N400 Enlarged prostate without lower urinary tract symptoms: Secondary | ICD-10-CM | POA: Diagnosis present

## 2022-07-23 DIAGNOSIS — I10 Essential (primary) hypertension: Secondary | ICD-10-CM | POA: Diagnosis not present

## 2022-07-23 DIAGNOSIS — Z7401 Bed confinement status: Secondary | ICD-10-CM | POA: Diagnosis not present

## 2022-07-23 DIAGNOSIS — Z981 Arthrodesis status: Secondary | ICD-10-CM

## 2022-07-23 DIAGNOSIS — R296 Repeated falls: Principal | ICD-10-CM

## 2022-07-23 DIAGNOSIS — E1122 Type 2 diabetes mellitus with diabetic chronic kidney disease: Secondary | ICD-10-CM | POA: Diagnosis present

## 2022-07-23 DIAGNOSIS — W010XXA Fall on same level from slipping, tripping and stumbling without subsequent striking against object, initial encounter: Secondary | ICD-10-CM | POA: Diagnosis present

## 2022-07-23 DIAGNOSIS — R739 Hyperglycemia, unspecified: Secondary | ICD-10-CM | POA: Diagnosis not present

## 2022-07-23 DIAGNOSIS — I130 Hypertensive heart and chronic kidney disease with heart failure and stage 1 through stage 4 chronic kidney disease, or unspecified chronic kidney disease: Secondary | ICD-10-CM | POA: Diagnosis not present

## 2022-07-23 DIAGNOSIS — N183 Chronic kidney disease, stage 3 unspecified: Secondary | ICD-10-CM | POA: Diagnosis present

## 2022-07-23 DIAGNOSIS — E1169 Type 2 diabetes mellitus with other specified complication: Secondary | ICD-10-CM | POA: Diagnosis not present

## 2022-07-23 DIAGNOSIS — Z951 Presence of aortocoronary bypass graft: Secondary | ICD-10-CM

## 2022-07-23 DIAGNOSIS — M6281 Muscle weakness (generalized): Secondary | ICD-10-CM | POA: Diagnosis not present

## 2022-07-23 DIAGNOSIS — R531 Weakness: Secondary | ICD-10-CM | POA: Diagnosis not present

## 2022-07-23 DIAGNOSIS — Z87891 Personal history of nicotine dependence: Secondary | ICD-10-CM | POA: Diagnosis not present

## 2022-07-23 DIAGNOSIS — S0181XA Laceration without foreign body of other part of head, initial encounter: Secondary | ICD-10-CM

## 2022-07-23 DIAGNOSIS — E1159 Type 2 diabetes mellitus with other circulatory complications: Secondary | ICD-10-CM | POA: Diagnosis present

## 2022-07-23 DIAGNOSIS — S0191XA Laceration without foreign body of unspecified part of head, initial encounter: Secondary | ICD-10-CM | POA: Diagnosis not present

## 2022-07-23 DIAGNOSIS — I779 Disorder of arteries and arterioles, unspecified: Secondary | ICD-10-CM | POA: Diagnosis present

## 2022-07-23 DIAGNOSIS — Z79899 Other long term (current) drug therapy: Secondary | ICD-10-CM | POA: Diagnosis not present

## 2022-07-23 DIAGNOSIS — I5032 Chronic diastolic (congestive) heart failure: Secondary | ICD-10-CM | POA: Diagnosis present

## 2022-07-23 DIAGNOSIS — R2681 Unsteadiness on feet: Secondary | ICD-10-CM | POA: Diagnosis not present

## 2022-07-23 DIAGNOSIS — R9431 Abnormal electrocardiogram [ECG] [EKG]: Secondary | ICD-10-CM | POA: Diagnosis not present

## 2022-07-23 DIAGNOSIS — M4802 Spinal stenosis, cervical region: Secondary | ICD-10-CM | POA: Diagnosis present

## 2022-07-23 DIAGNOSIS — Z823 Family history of stroke: Secondary | ICD-10-CM

## 2022-07-23 DIAGNOSIS — M25532 Pain in left wrist: Secondary | ICD-10-CM | POA: Diagnosis not present

## 2022-07-23 LAB — I-STAT VENOUS BLOOD GAS, ED
Acid-Base Excess: 0 mmol/L (ref 0.0–2.0)
Bicarbonate: 27 mmol/L (ref 20.0–28.0)
Calcium, Ion: 1.23 mmol/L (ref 1.15–1.40)
HCT: 36 % — ABNORMAL LOW (ref 39.0–52.0)
Hemoglobin: 12.2 g/dL — ABNORMAL LOW (ref 13.0–17.0)
O2 Saturation: 45 %
Patient temperature: 98.7
Potassium: 4.5 mmol/L (ref 3.5–5.1)
Sodium: 130 mmol/L — ABNORMAL LOW (ref 135–145)
TCO2: 29 mmol/L (ref 22–32)
pCO2, Ven: 50.6 mmHg (ref 44–60)
pH, Ven: 7.336 (ref 7.25–7.43)
pO2, Ven: 27 mmHg — CL (ref 32–45)

## 2022-07-23 LAB — CBC WITH DIFFERENTIAL/PLATELET
Abs Immature Granulocytes: 0.02 10*3/uL (ref 0.00–0.07)
Basophils Absolute: 0 10*3/uL (ref 0.0–0.1)
Basophils Relative: 1 %
Eosinophils Absolute: 0.4 10*3/uL (ref 0.0–0.5)
Eosinophils Relative: 5 %
HCT: 36 % — ABNORMAL LOW (ref 39.0–52.0)
Hemoglobin: 12.1 g/dL — ABNORMAL LOW (ref 13.0–17.0)
Immature Granulocytes: 0 %
Lymphocytes Relative: 18 %
Lymphs Abs: 1.3 10*3/uL (ref 0.7–4.0)
MCH: 31.8 pg (ref 26.0–34.0)
MCHC: 33.6 g/dL (ref 30.0–36.0)
MCV: 94.7 fL (ref 80.0–100.0)
Monocytes Absolute: 0.7 10*3/uL (ref 0.1–1.0)
Monocytes Relative: 9 %
Neutro Abs: 5.1 10*3/uL (ref 1.7–7.7)
Neutrophils Relative %: 67 %
Platelets: 173 10*3/uL (ref 150–400)
RBC: 3.8 MIL/uL — ABNORMAL LOW (ref 4.22–5.81)
RDW: 12.7 % (ref 11.5–15.5)
WBC: 7.5 10*3/uL (ref 4.0–10.5)
nRBC: 0 % (ref 0.0–0.2)

## 2022-07-23 LAB — URINALYSIS, ROUTINE W REFLEX MICROSCOPIC
Bacteria, UA: NONE SEEN
Bilirubin Urine: NEGATIVE
Glucose, UA: >1000 mg/dL — AB
Ketones, ur: NEGATIVE mg/dL
Leukocytes,Ua: NEGATIVE
Nitrite: NEGATIVE
Protein, ur: NEGATIVE mg/dL
Specific Gravity, Urine: 1.016 (ref 1.005–1.030)
pH: 5.5 (ref 5.0–8.0)

## 2022-07-23 LAB — BASIC METABOLIC PANEL
Anion gap: 9 (ref 5–15)
Anion gap: 8 (ref 5–15)
BUN: 26 mg/dL — ABNORMAL HIGH (ref 8–23)
BUN: 19 mg/dL (ref 8–23)
CO2: 25 mmol/L (ref 22–32)
CO2: 25 mmol/L (ref 22–32)
Calcium: 9.3 mg/dL (ref 8.9–10.3)
Calcium: 9 mg/dL (ref 8.9–10.3)
Chloride: 96 mmol/L — ABNORMAL LOW (ref 98–111)
Chloride: 98 mmol/L (ref 98–111)
Creatinine, Ser: 1.63 mg/dL — ABNORMAL HIGH (ref 0.61–1.24)
Creatinine, Ser: 1.79 mg/dL — ABNORMAL HIGH (ref 0.61–1.24)
GFR, Estimated: 42 mL/min — ABNORMAL LOW (ref >60)
GFR, Estimated: 37 mL/min — ABNORMAL LOW (ref >60)
Glucose, Bld: 527 mg/dL (ref 70–99)
Glucose, Bld: 442 mg/dL — ABNORMAL HIGH (ref 70–99)
Potassium: 4.7 mmol/L (ref 3.5–5.1)
Potassium: 4.4 mmol/L (ref 3.5–5.1)
Sodium: 130 mmol/L — ABNORMAL LOW (ref 135–145)
Sodium: 131 mmol/L — ABNORMAL LOW (ref 135–145)

## 2022-07-23 LAB — CBG MONITORING, ED
Glucose-Capillary: 524 mg/dL (ref 70–99)
Glucose-Capillary: 244 mg/dL — ABNORMAL HIGH (ref 70–99)

## 2022-07-23 LAB — TROPONIN I (HIGH SENSITIVITY)
Troponin I (High Sensitivity): 13 ng/L (ref <18)
Troponin I (High Sensitivity): 31 ng/L — ABNORMAL HIGH (ref <18)

## 2022-07-23 LAB — GLUCOSE, CAPILLARY
Glucose-Capillary: 294 mg/dL — ABNORMAL HIGH (ref 70–99)
Glucose-Capillary: 413 mg/dL — ABNORMAL HIGH (ref 70–99)
Glucose-Capillary: 433 mg/dL — ABNORMAL HIGH (ref 70–99)

## 2022-07-23 LAB — HEMOGLOBIN A1C
Hgb A1c MFr Bld: 10.7 % — ABNORMAL HIGH (ref 4.8–5.6)
Mean Plasma Glucose: 260.39 mg/dL

## 2022-07-23 MED ORDER — ACETAMINOPHEN 650 MG RE SUPP: 650.0000 mg | Freq: Four times a day (QID) | RECTAL | Status: DC | PRN

## 2022-07-23 MED ORDER — ACETAMINOPHEN 325 MG PO TABS
650.0000 mg | ORAL_TABLET | Freq: Once | ORAL | Status: AC
  Administered 2022-07-23: 650 mg via ORAL
  Filled 2022-07-23: qty 2

## 2022-07-23 MED ORDER — FUROSEMIDE 40 MG PO TABS
40.0000 mg | ORAL_TABLET | Freq: Two times a day (BID) | ORAL | Status: DC
  Administered 2022-07-23 – 2022-07-26 (×7): 40 mg via ORAL
  Filled 2022-07-23 (×7): qty 1

## 2022-07-23 MED ORDER — LIDOCAINE-EPINEPHRINE-TETRACAINE (LET) TOPICAL GEL
3.0000 mL | Freq: Once | TOPICAL | Status: DC
  Filled 2022-07-23: qty 3

## 2022-07-23 MED ORDER — LIDOCAINE-EPINEPHRINE-TETRACAINE (LET) TOPICAL GEL
3.0000 mL | Freq: Once | TOPICAL | Status: AC
  Administered 2022-07-23: 3 mL via TOPICAL
  Filled 2022-07-23: qty 3

## 2022-07-23 MED ORDER — HYDRALAZINE HCL 50 MG PO TABS
50.0000 mg | ORAL_TABLET | Freq: Three times a day (TID) | ORAL | Status: DC
  Administered 2022-07-23 – 2022-07-27 (×11): 50 mg via ORAL
  Filled 2022-07-23 (×12): qty 1

## 2022-07-23 MED ORDER — AMLODIPINE BESYLATE 5 MG PO TABS
10.0000 mg | ORAL_TABLET | Freq: Once | ORAL | Status: AC
  Administered 2022-07-23: 10 mg via ORAL
  Filled 2022-07-23: qty 2

## 2022-07-23 MED ORDER — SODIUM CHLORIDE 0.9% FLUSH
3.0000 mL | Freq: Two times a day (BID) | INTRAVENOUS | Status: DC
  Administered 2022-07-23 – 2022-07-27 (×8): 3 mL via INTRAVENOUS

## 2022-07-23 MED ORDER — SODIUM CHLORIDE 0.9 % IV BOLUS
1000.0000 mL | Freq: Once | INTRAVENOUS | Status: AC
  Administered 2022-07-23: 1000 mL via INTRAVENOUS

## 2022-07-23 MED ORDER — INSULIN ASPART 100 UNIT/ML IJ SOLN: 0.0000 [IU] | Freq: Every day | INTRAMUSCULAR | Status: DC

## 2022-07-23 MED ORDER — INSULIN ASPART 100 UNIT/ML IJ SOLN
5.0000 [IU] | Freq: Once | INTRAMUSCULAR | Status: AC
  Administered 2022-07-24: 5 [IU] via SUBCUTANEOUS

## 2022-07-23 MED ORDER — POLYETHYLENE GLYCOL 3350 17 G PO PACK: 17.0000 g | PACK | Freq: Every day | ORAL | Status: DC | PRN

## 2022-07-23 MED ORDER — SILVER NITRATE-POT NITRATE 75-25 % EX MISC
2.0000 | Freq: Once | CUTANEOUS | Status: DC
  Filled 2022-07-23: qty 10

## 2022-07-23 MED ORDER — INSULIN ASPART PROT & ASPART (70-30 MIX) 100 UNIT/ML ~~LOC~~ SUSP
5.0000 [IU] | Freq: Two times a day (BID) | SUBCUTANEOUS | Status: DC
  Administered 2022-07-24: 5 [IU] via SUBCUTANEOUS
  Filled 2022-07-23: qty 10

## 2022-07-23 MED ORDER — SIMVASTATIN 20 MG PO TABS
40.0000 mg | ORAL_TABLET | Freq: Every day | ORAL | Status: DC
  Administered 2022-07-24 – 2022-07-27 (×4): 40 mg via ORAL
  Filled 2022-07-23 (×4): qty 2

## 2022-07-23 MED ORDER — ENOXAPARIN SODIUM 40 MG/0.4ML IJ SOSY
40.0000 mg | PREFILLED_SYRINGE | INTRAMUSCULAR | Status: DC
  Administered 2022-07-23 – 2022-07-26 (×4): 40 mg via SUBCUTANEOUS
  Filled 2022-07-23 (×4): qty 0.4

## 2022-07-23 MED ORDER — INSULIN REGULAR HUMAN 100 UNIT/ML IJ SOLN: 10.0000 [IU] | Freq: Once | INTRAMUSCULAR | Status: DC

## 2022-07-23 MED ORDER — ACETAMINOPHEN 325 MG PO TABS
650.0000 mg | ORAL_TABLET | Freq: Four times a day (QID) | ORAL | Status: DC | PRN
  Administered 2022-07-23 – 2022-07-25 (×3): 650 mg via ORAL
  Filled 2022-07-23 (×3): qty 2

## 2022-07-23 MED ORDER — CARVEDILOL 6.25 MG PO TABS
6.2500 mg | ORAL_TABLET | Freq: Two times a day (BID) | ORAL | Status: DC
  Administered 2022-07-23 – 2022-07-25 (×4): 6.25 mg via ORAL
  Filled 2022-07-23 (×4): qty 1

## 2022-07-23 MED ORDER — INSULIN ASPART 100 UNIT/ML IJ SOLN
0.0000 [IU] | Freq: Three times a day (TID) | INTRAMUSCULAR | Status: DC
  Administered 2022-07-24: 3 [IU] via SUBCUTANEOUS

## 2022-07-23 MED ORDER — ALFUZOSIN HCL ER 10 MG PO TB24
10.0000 mg | ORAL_TABLET | Freq: Every day | ORAL | Status: DC
  Administered 2022-07-24 – 2022-07-27 (×4): 10 mg via ORAL
  Filled 2022-07-23 (×5): qty 1

## 2022-07-23 MED ORDER — INSULIN ASPART 100 UNIT/ML IJ SOLN
10.0000 [IU] | Freq: Once | INTRAMUSCULAR | Status: AC
  Administered 2022-07-23: 10 [IU] via SUBCUTANEOUS

## 2022-07-23 NOTE — H&P (Signed)
History and Physical   Ramone Blumenstock Arvidson [id number] DOB: Oct 28, 1940 DOA: 07/23/2022  PCP: Vivi Barrack, MD   Patient coming from: Home  Chief Complaint: Recurrent falls, declining cognition  HPI: Justin Salinas is a 82 y.o. male with medical history significant of diabetes, carotid artery disease, CAD status post CABG, anemia, hyperlipidemia, hypertension, GERD, CKD 3, BPH, diastolic dysfunction presenting for fall at home.  History obtained with assistance of chart review and family.  Patient has had multiple falls recently and today had a fall while carrying a microwave out to the dumpster and fell and hit his face on the ground.  No loss of consciousness and able to ambulate afterwards.  Did have head laceration secondary to this.  Daughter has been trying to get patient home health but he has become somewhat paranoid and has had overall decline in his cognition in recent months with some behavior changes and has been chasing away the home health staff.  Also states that he has forgotten how to give or simply is not consistently giving himself his insulin.    He denies fevers, chills, chest pain, shortness of breath, abdominal pain, constipation, diarrhea, nausea, vomit.  ED Course: Vital signs in the ED significant for blood pressure in the [id number] to [id number] systolic.  Lab workup included BMP with sodium 130 which improved considering glucose of 527, chloride 96, BUN 26, creatinine stable 1.63.  CBC with hemoglobin stable at 12.1.  Troponin negative with repeat pending.  Urinalysis with glucose and hemoglobin only.  VBG with normal pH and pCO2.  Wrist x-ray showed calcific density with questionable tiny fracture.  CT of the head/C-spine/maxillofacial CT showed no acute abnormality but did show moderate spinal stenosis at C4-C5.  Patient received Tylenol, 10 units of insulin, liter of fluids, amlodipine in the ED.  Review of Systems: As per HPI otherwise all other systems reviewed and are  negative.  Past Medical History:  Diagnosis Date   Allergy    Anemia    Asthma    Atherosclerosis of abdominal aorta ([place]) 02/26/2020   Seen on x-ray L-spine September 2021   BPH (benign prostatic hyperplasia)    CAD (coronary artery disease)    a. s/p MI 09/1993;  b. known CTO of RCA;  c. 06/2014 Cath: LM short/mod dzs, LAD 95ost, LCX nl, RI nl, RCA 100, EF 55%;  c. 06/2014 s/p CABG x 4: LIMA->LAD, VG->D1, VG->OM1, VG->Acute Marginal.   Carotid stenosis    a. Carotid US [id number]:  RICA [id number]; LICA [id number] // Carotid US 1/22: R 1-39; L 40-59; L VA aberrant flow; bilat subclavian stenosis // Carotid US 1/23: R 1-39, L 40-59; R subclavian stenosis   CKD (chronic kidney disease), stage III (HCC)    Constipation    in the past 2-3 weeks   Diabetes ([place])    AODM   Diastolic dysfunction    a. 05/2015 Echo: EF 60-65%, LVH, Gr 1 DD, mild MR, mod dil RA/LA, mod TR, PASP 73mHg.   Essential hypertension    GERD (gastroesophageal reflux disease)    History of kidney stones    Hyperlipidemia     Past Surgical History:  Procedure Laterality Date   ANGIOPLASTY  09/22/1993   POBA of RCA (Dr. RMarella Chimes   [place]  back fusion - Dr. HEarnie Larsson  CARDIAC CATHETERIZATION  11/28/1998   "silent occlusion" of RCA w/collaterals from left coronary system, prox LAD w/40-50% eccentric narrowing, 1st diagonal with 70-80%  eccentric narrowing, 85% narrowing of prox small OM1 (Dr. Marella Chimes)   CAROTID DOPPLER  07/2011   left subclavian (50-69%); right bulb (0-49%); RICA (normal); left mid-distal CCA (0-49%); left bulb/prox ICA (50-69%); left vertebral with abnormal antegrade flow   CORONARY ARTERY BYPASS GRAFT N/A 05/21/2014   Procedure: CORONARY ARTERY BYPASS GRAFTING (CABG) x  four, using left internal mammary artery and right leg greater saphenous vein harvested endoscopically;  Surgeon: Melrose Nakayama, MD;  Location: [place];  Service: Open Heart Surgery;  Laterality: N/A;   INTRAOPERATIVE  TRANSESOPHAGEAL ECHOCARDIOGRAM N/A 05/21/2014   Procedure: INTRAOPERATIVE TRANSESOPHAGEAL ECHOCARDIOGRAM;  Surgeon: Melrose Nakayama, MD;  Location: [place];  Service: Open Heart Surgery;  Laterality: N/A;   LEFT HEART CATHETERIZATION WITH CORONARY ANGIOGRAM N/A 05/19/2014   Procedure: LEFT HEART CATHETERIZATION WITH CORONARY ANGIOGRAM;  Surgeon: Blane Ohara, MD;  Location: [facility] CATH LAB;  Service: Cardiovascular;  Laterality: N/A;   LUMBAR LAMINECTOMY/DECOMPRESSION MICRODISCECTOMY Left 09/23/2020   Procedure: Laminectomy and Foraminotomy - left - Lumbar three-Lumbar four;  Surgeon: Earnie Larsson, MD;  Location: [place];  Service: Neurosurgery;  Laterality: Left;   NASAL SINUS SURGERY  2010   NM MYOCAR PERF WALL MOTION  09/19/2009   bruce myoview - mild perfusion defect in basal inferior region (infarct/scar), EF 60%, low risk scan   RENAL DOPPLER  10/2011   SMA w/ 70-99% diameter reduction & high grade stenosis; R&L renals w/narrowing and increased velocities (60-99%), R kidney smaller than L   TRANSTHORACIC ECHOCARDIOGRAM  10/20/2012   EF 55-60%, moderate concentric hypertrophy, ventricular septum thickness increased, calcified MV annulus   VASECTOMY  1963    Social History  reports that he quit smoking about 50 years ago. His smoking use included cigarettes. His smokeless tobacco use includes chew. He reports current alcohol use of about 3.0 standard drinks of alcohol per week. He reports that he does not use drugs.  Allergies  Allergen Reactions   Exenatide Other (See Comments)   Linagliptin Other (See Comments)   Lasix [Furosemide] Other (See Comments)    Constipation    Family History  Problem Relation Age of Onset   Uterine cancer Mother    Stroke Father    Diabetes Sister    Colon polyps Brother    Heart disease Brother    Colon cancer Neg Hx    Esophageal cancer Neg Hx    Rectal cancer Neg Hx    Stomach cancer Neg Hx   Reviewed on admission  Prior to Admission  medications   Medication Sig Start Date End Date Taking? Authorizing Provider  hydrALAZINE (APRESOLINE) 50 MG tablet Take by mouth. 05/13/22  Yes [provider]  ACCU-CHEK GUIDE test strip TEST BLOOD SUGAR TWICE DAILY 12/18/21   Elayne Snare, MD  alfuzosin (UROXATRAL) 10 MG 24 hr tablet Take 10 mg by mouth daily with breakfast.    [provider]  amLODipine (NORVASC) 10 MG tablet TAKE 1 TABLET EVERY DAY 11/22/21   [name], MD  Ascorbic Acid (VITAMIN C) 1000 MG tablet Take 1,000 mg by mouth daily.    [provider]  aspirin 81 MG tablet Take 81 mg by mouth daily.    [provider]  azelastine (ASTELIN) 0.1 % nasal spray Place 2 sprays into both nostrils 2 (two) times daily. 09/19/21   Vivi Barrack, MD  carvedilol (COREG) 6.25 MG tablet Take 6.25 mg by mouth 2 (two) times daily with a meal.    [provider]  furosemide (LASIX) 40 MG tablet Take 40 mg by mouth 2 (two) times daily.    [provider]  Glucosamine-Chondroit-Vit C-Mn (GLUCOSAMINE 1500 COMPLEX) CAPS Take by mouth.    [provider]  glucose blood (ACCU-CHEK GUIDE) test strip Use to check blood sugars 2 times daily 09/05/18   Elayne Snare, MD  Insulin Pen Needle (DROPLET PEN NEEDLES) 31G X 5 MM MISC USE AS DIRECTED SUBCUTANEOUSLY TWO TIMES DAILY 03/09/22   Elayne Snare, MD  Lancets Misc. (ACCU-CHEK SOFTCLIX LANCET DEV) KIT  08/21/13   [provider]  Multiple Vitamins-Minerals (CENTRUM SILVER ADULT 50+ PO) Take by mouth.    [provider]  NOVOLOG MIX 70/30 FLEXPEN (70-30) 100 UNIT/ML FlexPen INJECT 8 UNITS UNDER THE SKIN BEFORE BREAKFAST AND 8 UNITS AT DINNER. DISCARD PEN 14 DAYS AFTER OPENING 03/29/22   Elayne Snare, MD  Omega-3 1400 MG CAPS Take 1,400 mg by mouth daily.    [provider]  pregabalin (LYRICA) 25 MG capsule Take 1 capsule (25 mg total) by mouth 2 (two) times daily. 08/22/21   Vivi Barrack, MD  repaglinide (PRANDIN) 1 MG  tablet TAKE ONE TABLET BEFORE BREAKFAST AND LUNCH 10/16/21   Elayne Snare, MD  simvastatin (ZOCOR) 40 MG tablet Take 1 tablet (40 mg total) by mouth daily. 12/08/21   [name], MD  sodium chloride (OCEAN) 0.65 % SOLN nasal spray Place 1 spray into both nostrils as needed for congestion.    [provider]  TRUEplus Lancets 28G MISC TEST BLOOD SUGAR TWICE DAILY 07/25/21   Elayne Snare, MD    Physical Exam: Vitals:   07/23/22 1400 07/23/22 1500 07/23/22 1530 07/23/22 1658  BP: (!) 169/79 (!) 192/68 (!) 196/86 (!) 187/79  Pulse: (!) 57   66  Resp: 19 (!) 22 19 18  $ Temp:   98.2 F (36.8 C) 98.2 F (36.8 C)  TempSrc:    Oral  SpO2: 98%  97% 99%    Physical Exam Constitutional:      General: He is not in acute distress.    Appearance: Normal appearance.  HENT:     Head: Normocephalic.     Comments: Manage on forehead and splint/bandage on nose with some surrounding dried blood    Mouth/Throat:     Mouth: Mucous membranes are moist.     Pharynx: Oropharynx is clear.  Eyes:     Extraocular Movements: Extraocular movements intact.     Pupils: Pupils are equal, round, and reactive to light.  Cardiovascular:     Rate and Rhythm: Normal rate and regular rhythm.     Pulses: Normal pulses.     Heart sounds: Normal heart sounds.  Pulmonary:     Effort: Pulmonary effort is normal. No respiratory distress.     Breath sounds: Normal breath sounds.  Abdominal:     General: Bowel sounds are normal. There is no distension.     Palpations: Abdomen is soft.     Tenderness: There is no abdominal tenderness.  Musculoskeletal:        General: No swelling or deformity.  Skin:    General: Skin is warm and dry.  Neurological:     General: No focal deficit present.     Mental Status: Mental status is at baseline.    Labs on Admission: I have personally reviewed following labs and imaging studies  CBC: Recent Labs  Lab 07/23/22 0925 07/23/22 1002  WBC 7.5  --   NEUTROABS 5.1   --  HGB 12.1* 12.2*  HCT 36.0* 36.0*  MCV 94.7  --   PLT 173  --     Basic Metabolic Panel: Recent Labs  Lab 07/23/22 0925 07/23/22 1002  NA 130* 130*  K 4.7 4.5  CL 96*  --   CO2 25  --   GLUCOSE 527*  --   BUN 26*  --   CREATININE 1.63*  --   CALCIUM 9.3  --     GFR: Estimated Creatinine Clearance: 34.1 mL/min (A) (by C-G formula based on SCr of 1.63 mg/dL (H)).  Liver Function Tests: No results for input(s): "AST", "ALT", "ALKPHOS", "BILITOT", "PROT", "ALBUMIN" in the last 168 hours.  Urine analysis:    Component Value Date/Time   COLORURINE YELLOW 07/23/2022 1000   APPEARANCEUR CLEAR 07/23/2022 1000   LABSPEC 1.016 07/23/2022 1000   PHURINE 5.5 07/23/2022 1000   GLUCOSEU >1,000 (A) 07/23/2022 1000   GLUCOSEU NEGATIVE 04/27/2019 0830   HGBUR TRACE (A) 07/23/2022 1000   BILIRUBINUR NEGATIVE 07/23/2022 1000   KETONESUR NEGATIVE 07/23/2022 1000   PROTEINUR NEGATIVE 07/23/2022 1000   UROBILINOGEN 0.2 04/27/2019 0830   NITRITE NEGATIVE 07/23/2022 1000   LEUKOCYTESUR NEGATIVE 07/23/2022 1000    Radiological Exams on Admission: CT Head Wo Contrast  Result Date: 07/23/2022 CLINICAL DATA:  Facial trauma EXAM: CT HEAD WITHOUT CONTRAST CT MAXILLOFACIAL WITHOUT CONTRAST CT CERVICAL SPINE WITHOUT CONTRAST TECHNIQUE: Multidetector CT imaging of the head, cervical spine, and maxillofacial structures were performed using the standard protocol without intravenous contrast. Multiplanar CT image reconstructions of the cervical spine and maxillofacial structures were also generated. RADIATION DOSE REDUCTION: This exam was performed according to the departmental dose-optimization program which includes automated exposure control, adjustment of the mA and/or kV according to patient size and/or use of iterative reconstruction technique. COMPARISON:  CT Head 07/16/22 FINDINGS: CT HEAD FINDINGS Brain: No evidence of acute infarction, hemorrhage, extra-axial collection or mass lesion/mass  effect. Chronic left cerebellar infarct. Unchanged size and shape of the ventricular system. Vascular: No hyperdense vessel or unexpected calcification. Skull: Normal. Negative for fracture or focal lesion. Other: None CT MAXILLOFACIAL FINDINGS Osseous: No fracture or mandibular dislocation. No destructive process. Orbits: Bilateral lens replacement. Orbits are otherwise unremarkable. Sinuses: Near-complete opacification left maxillary sinus with osseous changes suggestive of chronic left maxillary sinusitis. Paranasal sinuses are otherwise clear. Soft tissues: Aeration along the midline frontal scalp. CT CERVICAL SPINE FINDINGS Alignment: Grade 1 anterolisthesis of C4 on C5 and C7 on T1. Skull base and vertebrae: No acute fracture. No primary bone lesion or focal pathologic process. Soft tissues and spinal canal: No prevertebral fluid or swelling. No visible canal hematoma. Disc levels: There is likely at least moderate spinal canal stenosis at C4-C5 secondary to be large disc bulge. Upper chest: Negative. Other: None IMPRESSION: CT HEAD: 1. No acute intracranial abnormality. 2. Chronic left cerebellar infarct. CT MAXILLOFACIAL: 1. No acute facial bone fracture. 2. Chronic left maxillary sinusitis. CT CERVICAL SPINE: 1. No acute cervical spine fracture. 2. Likely at least moderate spinal canal stenosis at C4-C5 secondary to large disc bulge. Electronically Signed   By: Marin Roberts M.D.   On: 07/23/2022 10:47   CT Cervical Spine Wo Contrast  Result Date: 07/23/2022 CLINICAL DATA:  Facial trauma EXAM: CT HEAD WITHOUT CONTRAST CT MAXILLOFACIAL WITHOUT CONTRAST CT CERVICAL SPINE WITHOUT CONTRAST TECHNIQUE: Multidetector CT imaging of the head, cervical spine, and maxillofacial structures were performed using the standard protocol without intravenous contrast. Multiplanar CT image reconstructions of the cervical spine  and maxillofacial structures were also generated. RADIATION DOSE REDUCTION: This exam was  performed according to the departmental dose-optimization program which includes automated exposure control, adjustment of the mA and/or kV according to patient size and/or use of iterative reconstruction technique. COMPARISON:  CT Head 07/16/22 FINDINGS: CT HEAD FINDINGS Brain: No evidence of acute infarction, hemorrhage, extra-axial collection or mass lesion/mass effect. Chronic left cerebellar infarct. Unchanged size and shape of the ventricular system. Vascular: No hyperdense vessel or unexpected calcification. Skull: Normal. Negative for fracture or focal lesion. Other: None CT MAXILLOFACIAL FINDINGS Osseous: No fracture or mandibular dislocation. No destructive process. Orbits: Bilateral lens replacement. Orbits are otherwise unremarkable. Sinuses: Near-complete opacification left maxillary sinus with osseous changes suggestive of chronic left maxillary sinusitis. Paranasal sinuses are otherwise clear. Soft tissues: Aeration along the midline frontal scalp. CT CERVICAL SPINE FINDINGS Alignment: Grade 1 anterolisthesis of C4 on C5 and C7 on T1. Skull base and vertebrae: No acute fracture. No primary bone lesion or focal pathologic process. Soft tissues and spinal canal: No prevertebral fluid or swelling. No visible canal hematoma. Disc levels: There is likely at least moderate spinal canal stenosis at C4-C5 secondary to be large disc bulge. Upper chest: Negative. Other: None IMPRESSION: CT HEAD: 1. No acute intracranial abnormality. 2. Chronic left cerebellar infarct. CT MAXILLOFACIAL: 1. No acute facial bone fracture. 2. Chronic left maxillary sinusitis. CT CERVICAL SPINE: 1. No acute cervical spine fracture. 2. Likely at least moderate spinal canal stenosis at C4-C5 secondary to large disc bulge. Electronically Signed   By: Marin Roberts M.D.   On: 07/23/2022 10:47   CT Maxillofacial Wo Contrast  Result Date: 07/23/2022 CLINICAL DATA:  Facial trauma EXAM: CT HEAD WITHOUT CONTRAST CT MAXILLOFACIAL WITHOUT  CONTRAST CT CERVICAL SPINE WITHOUT CONTRAST TECHNIQUE: Multidetector CT imaging of the head, cervical spine, and maxillofacial structures were performed using the standard protocol without intravenous contrast. Multiplanar CT image reconstructions of the cervical spine and maxillofacial structures were also generated. RADIATION DOSE REDUCTION: This exam was performed according to the departmental dose-optimization program which includes automated exposure control, adjustment of the mA and/or kV according to patient size and/or use of iterative reconstruction technique. COMPARISON:  CT Head 07/16/22 FINDINGS: CT HEAD FINDINGS Brain: No evidence of acute infarction, hemorrhage, extra-axial collection or mass lesion/mass effect. Chronic left cerebellar infarct. Unchanged size and shape of the ventricular system. Vascular: No hyperdense vessel or unexpected calcification. Skull: Normal. Negative for fracture or focal lesion. Other: None CT MAXILLOFACIAL FINDINGS Osseous: No fracture or mandibular dislocation. No destructive process. Orbits: Bilateral lens replacement. Orbits are otherwise unremarkable. Sinuses: Near-complete opacification left maxillary sinus with osseous changes suggestive of chronic left maxillary sinusitis. Paranasal sinuses are otherwise clear. Soft tissues: Aeration along the midline frontal scalp. CT CERVICAL SPINE FINDINGS Alignment: Grade 1 anterolisthesis of C4 on C5 and C7 on T1. Skull base and vertebrae: No acute fracture. No primary bone lesion or focal pathologic process. Soft tissues and spinal canal: No prevertebral fluid or swelling. No visible canal hematoma. Disc levels: There is likely at least moderate spinal canal stenosis at C4-C5 secondary to be large disc bulge. Upper chest: Negative. Other: None IMPRESSION: CT HEAD: 1. No acute intracranial abnormality. 2. Chronic left cerebellar infarct. CT MAXILLOFACIAL: 1. No acute facial bone fracture. 2. Chronic left maxillary sinusitis. CT  CERVICAL SPINE: 1. No acute cervical spine fracture. 2. Likely at least moderate spinal canal stenosis at C4-C5 secondary to large disc bulge. Electronically Signed   By: Marin Olp.D.  On: 07/23/2022 10:47   DG Wrist Complete Left  Result Date: 07/23/2022 CLINICAL DATA:  Fall with injury. Patient reports cannula microwave to the trash and tripped and fell left wrist pain. EXAM: LEFT WRIST - COMPLETE 3+ VIEW COMPARISON:  None available FINDINGS: Mildly decreased bone mineralization. There is mild calcific density just medial to the triquetrum on frontal view. No definitive adjacent cortical defect is seen, however recommend clinical correlation for possible tiny superficial fracture. Mild thumb carpometacarpal joint space narrowing and peripheral degenerative spurring. Mild vascular calcifications. IMPRESSION: There is age indeterminate mild calcific density just medial to the triquetrum on frontal view. No definitive adjacent cortical defect is seen, however recommend clinical correlation for possible tiny superficial fracture. Electronically Signed   By: Yvonne Kendall M.D.   On: 07/23/2022 10:00    EKG: Independently reviewed.  Sinus rhythm at 68 bpm baseline artifact.  Nonspecific T wave changes.  Assessment/Plan Principal Problem:   Recurrent falls Active Problems:   Dyslipidemia associated with type 2 diabetes mellitus (HCC)   CAD (coronary artery disease)   Carotid artery disease (HCC)   BPH (benign prostatic hyperplasia)   S/P CABG x 4   Diastolic dysfunction   Hypertension associated with diabetes ([place])   CKD stage 3 due to type 2 diabetes mellitus (HCC)   Type 2 diabetes mellitus with hyperglycemia (HCC)   Iron deficiency anemia   Cognitive decline   Recurrent falls Cognitive decline > Patient having increasing issues with falls.  Also having cognitive decline with associated behavioral abnormalities. > Daughter has been trying to get home health but he is become paranoid of  them and has been chasing the way.  Has also forgotten how to do/when to take his insulin at home. > Presenting with recurrent falls and hyperglycemia with concern for safety if returns home.  No PT or OT at med center to further evaluate. > No focal deficits, does not appear to be acutely altered. - Monitor on MedSurg - PT OT eval and treat - TOC consult for assistance with home health versus placement  Hyperglycemia Diabetes > Hyperglycemia in the ED to 527 improved to the 200s with 10 units of short acting insulin. > On 8 units 70/30 twice daily at home. Issues with checking blood sugar and administering insulin recently. - 5 units 70/30 twice daily - SSI  CAD Carotid artery disease Hyperlipidemia > Status post CABG - Continue home carvedilol, simvastatin  Hypertension - Continue home amlodipine, carvedilol, Lasix, hydralazine  Diastolic dysfunction > Last echo was 2016 with EF 60-65%, G1 DD. - Continue home carvedilol, Lasix  BPH - Continue home alfuzosin  CKD 3 > Creatinine stable at 1.63 - Trend renal function and electrolytes  Anemia > Hemoglobin stable - Trend CBC  DVT prophylaxis: Lovenox Code Status:   Full  Family Communication:  Daughter updated by phone  Disposition Plan:   Patient is from:  Home  Anticipated DC to:  Pending clinical course  Anticipated DC date:  1 to 3 days  Anticipated DC barriers: If placement is indicated  Consults called:  None Admission status:  Observation, MedSurg  Severity of Illness: The appropriate patient status for this patient is OBSERVATION. Observation status is judged to be reasonable and necessary in order to provide the required intensity of service to ensure the patient's safety. The patient's presenting symptoms, physical exam findings, and initial radiographic and laboratory data in the context of their medical condition is felt to place them at decreased risk for further  clinical deterioration. Furthermore, it is  anticipated that the patient will be medically stable for discharge from the hospital within 2 midnights of admission.    Marcelyn Bruins MD [facility]  How to contact the [facility] Attending or Consulting provider [place] or covering provider during after hours [place], for this patient?   Check the care team in [facility] and look for a) attending/consulting [facility] provider listed and b) the [facility] team listed Log into [email or web address] and use [place]'s universal password to access. If you do not have the password, please contact the hospital operator. Locate the [facility] provider you are looking for under [facility] and page to a number that you can be directly reached. If you still have difficulty reaching the provider, please page the [facility] (Director on Call) for the Hospitalists listed on amion for assistance.  07/23/2022, 5:18 PM

## 2022-07-23 NOTE — Progress Notes (Signed)
Med req was verified with daughter Ardis Rowan

## 2022-07-23 NOTE — Progress Notes (Signed)
Patient arrived assessment is complete at this time.

## 2022-07-23 NOTE — ED Provider Notes (Addendum)
[facility] Provider Note   CSN: [id number] Arrival date & time: 07/23/22  [id number]     History  Chief Complaint  Patient presents with   Justin Salinas is a 82 y.o. male with past medical history significant for DMII, HTN, CKD, CAD, HLD presents to the ED with complaint of injuries from a fall.  Patient states he was carrying a microwave in his driveway this morning to the trash when he tripped and fell forward.  Patient complaining of injuries to his face as well as his left wrist.  When asked if patient takes blood thinners he says yes, however, he cannot recall what he takes and upon review of medication list, unable to find listed anticoagulant.  He reports he has not taken any of his medications this morning.  Denies loss of consciousness.  Patient was seen here last week for fall and gait instability.  Denies lightheadedness, dizziness, headache, weakness, numbness, neck pain, back pain, nausea, vomiting, epistaxis.            Home Medications Prior to Admission medications   Medication Sig Start Date End Date Taking? Authorizing Provider  hydrALAZINE (APRESOLINE) 50 MG tablet Take by mouth. 05/13/22  Yes [provider]  ACCU-CHEK GUIDE test strip TEST BLOOD SUGAR TWICE DAILY 12/18/21   Elayne Snare, MD  alfuzosin (UROXATRAL) 10 MG 24 hr tablet Take 10 mg by mouth daily with breakfast.    [provider]  amLODipine (NORVASC) 10 MG tablet TAKE 1 TABLET EVERY DAY 11/22/21   [name], MD  Ascorbic Acid (VITAMIN C) 1000 MG tablet Take 1,000 mg by mouth daily.    [provider]  aspirin 81 MG tablet Take 81 mg by mouth daily.    [provider]  azelastine (ASTELIN) 0.1 % nasal spray Place 2 sprays into both nostrils 2 (two) times daily. 09/19/21   Vivi Barrack, MD  carvedilol (COREG) 6.25 MG tablet Take 6.25 mg by mouth 2 (two) times daily with a meal.    [provider]   furosemide (LASIX) 40 MG tablet Take 40 mg by mouth 2 (two) times daily.    [provider]  Glucosamine-Chondroit-Vit C-Mn (GLUCOSAMINE 1500 COMPLEX) CAPS Take by mouth.    [provider]  glucose blood (ACCU-CHEK GUIDE) test strip Use to check blood sugars 2 times daily 09/05/18   Elayne Snare, MD  Insulin Pen Needle (DROPLET PEN NEEDLES) 31G X 5 MM MISC USE AS DIRECTED SUBCUTANEOUSLY TWO TIMES DAILY 03/09/22   Elayne Snare, MD  Lancets Misc. (ACCU-CHEK SOFTCLIX LANCET DEV) KIT  08/21/13   [provider]  Multiple Vitamins-Minerals (CENTRUM SILVER ADULT 50+ PO) Take by mouth.    [provider]  NOVOLOG MIX 70/30 FLEXPEN (70-30) 100 UNIT/ML FlexPen INJECT 8 UNITS UNDER THE SKIN BEFORE BREAKFAST AND 8 UNITS AT DINNER. DISCARD PEN 14 DAYS AFTER OPENING 03/29/22   Elayne Snare, MD  Omega-3 1400 MG CAPS Take 1,400 mg by mouth daily.    [provider]  pregabalin (LYRICA) 25 MG capsule Take 1 capsule (25 mg total) by mouth 2 (two) times daily. 08/22/21   Vivi Barrack, MD  repaglinide (PRANDIN) 1 MG tablet TAKE ONE TABLET BEFORE BREAKFAST AND LUNCH 10/16/21   Elayne Snare, MD  simvastatin (ZOCOR) 40 MG tablet Take 1 tablet (40 mg total) by mouth daily. 12/08/21   [name], MD  sodium chloride (OCEAN) 0.65 % SOLN nasal  spray Place 1 spray into both nostrils as needed for congestion.    [provider]  TRUEplus Lancets 28G Plain TEST BLOOD SUGAR TWICE DAILY 07/25/21   Elayne Snare, MD      Allergies    Exenatide, Linagliptin, and Lasix [furosemide]    Review of Systems   Review of Systems  HENT:  Negative for nosebleeds.   Gastrointestinal:  Negative for nausea and vomiting.  Musculoskeletal:  Negative for back pain and neck pain.  Skin:  Positive for wound (To forehead and nose).  Neurological:  Negative for dizziness, syncope, weakness, light-headedness, numbness and headaches.    Physical Exam Updated Vital Signs BP (!) 196/86   Pulse  (!) 57   Temp 98.4 F (36.9 C) (Oral)   Resp 19   SpO2 98%  Physical Exam Vitals and nursing note reviewed.  Constitutional:      General: He is not in acute distress.    Appearance: Normal appearance. He is not ill-appearing or diaphoretic.  HENT:     Head: Normocephalic. Laceration present. No raccoon eyes, Battle's sign or abrasion.     Jaw: There is normal jaw occlusion. No tenderness or pain on movement.      Comments: Laceration to the medial forehead with minimal bleeding.      Nose: Signs of injury and nasal tenderness present. No nasal deformity.     Right Nostril: No epistaxis.     Left Nostril: No epistaxis.      Comments: Avulsion of skin over the bridge of the nose with minimal bleeding    Mouth/Throat:     Lips: Pink.     Mouth: Mucous membranes are dry.  Cardiovascular:     Rate and Rhythm: Normal rate and regular rhythm.  Pulmonary:     Effort: Pulmonary effort is normal.  Musculoskeletal:     Left wrist: Tenderness and bony tenderness present. No swelling, deformity or snuff box tenderness. Normal range of motion.     Cervical back: Full passive range of motion without pain. No signs of trauma or crepitus. No pain with movement, spinous process tenderness or muscular tenderness.  Neurological:     General: No focal deficit present.     Mental Status: He is alert and oriented to person, place, and time. Mental status is at baseline.     Sensory: Sensation is intact.     Motor: Motor function is intact.     Comments: Cranial Nerves:  II: peripheral fields grossly intact III,IV, VI: ptosis not present, extra-ocular movements intact bilaterally, direct and consensual pupillary light reflexes intact bilaterally V: facial sensation, jaw opening, and bite strength equal bilaterally VII: eyebrow raise, eyelid close, smile, frown, pucker equal bilaterally VIII: hearing grossly normal bilaterally  IX,X: palate elevation and swallowing intact XI: bilateral shoulder  shrug and lateral head rotation equal and strong XII: midline tongue extension   Psychiatric:        Mood and Affect: Mood normal.        Behavior: Behavior normal.     ED Results / Procedures / Treatments   Labs (all labs ordered are listed, but only abnormal results are displayed) Labs Reviewed  BASIC METABOLIC PANEL - Abnormal; Notable for the following components:      Result Value   Sodium 130 (*)    Chloride 96 (*)    Glucose, Bld 527 (*)    BUN 26 (*)    Creatinine, Ser 1.63 (*)    GFR, Estimated 42 (*)  All other components within normal limits  CBC WITH DIFFERENTIAL/PLATELET - Abnormal; Notable for the following components:   RBC 3.80 (*)    Hemoglobin 12.1 (*)    HCT 36.0 (*)    All other components within normal limits  URINALYSIS, ROUTINE W REFLEX MICROSCOPIC - Abnormal; Notable for the following components:   Glucose, UA >1,000 (*)    Hgb urine dipstick TRACE (*)    All other components within normal limits  I-STAT VENOUS BLOOD GAS, ED - Abnormal; Notable for the following components:   pO2, Ven 27 (*)    Sodium 130 (*)    HCT 36.0 (*)    Hemoglobin 12.2 (*)    All other components within normal limits  CBG MONITORING, ED - Abnormal; Notable for the following components:   Glucose-Capillary 524 (*)    All other components within normal limits  CBG MONITORING, ED - Abnormal; Notable for the following components:   Glucose-Capillary 244 (*)    All other components within normal limits  BLOOD GAS, VENOUS  TROPONIN I (HIGH SENSITIVITY)  TROPONIN I (HIGH SENSITIVITY)    EKG EKG Interpretation  Date/Time:  Monday July 23 2022 12:15:56 EST Ventricular Rate:  68 PR Interval:  206 QRS Duration: 89 QT Interval:  447 QTC Calculation: 476 R Axis:   113 Text Interpretation: Sinus rhythm Anterior infarct, old Abnormal T, consider ischemia, diffuse leads New deeper T inversions inferior leads and lateral leads Confirmed by Georgina Snell [phone]) on  07/23/2022 12:21:46 PM  Radiology CT Head Wo Contrast  Result Date: 07/23/2022 CLINICAL DATA:  Facial trauma EXAM: CT HEAD WITHOUT CONTRAST CT MAXILLOFACIAL WITHOUT CONTRAST CT CERVICAL SPINE WITHOUT CONTRAST TECHNIQUE: Multidetector CT imaging of the head, cervical spine, and maxillofacial structures were performed using the standard protocol without intravenous contrast. Multiplanar CT image reconstructions of the cervical spine and maxillofacial structures were also generated. RADIATION DOSE REDUCTION: This exam was performed according to the departmental dose-optimization program which includes automated exposure control, adjustment of the mA and/or kV according to patient size and/or use of iterative reconstruction technique. COMPARISON:  CT Head 07/16/22 FINDINGS: CT HEAD FINDINGS Brain: No evidence of acute infarction, hemorrhage, extra-axial collection or mass lesion/mass effect. Chronic left cerebellar infarct. Unchanged size and shape of the ventricular system. Vascular: No hyperdense vessel or unexpected calcification. Skull: Normal. Negative for fracture or focal lesion. Other: None CT MAXILLOFACIAL FINDINGS Osseous: No fracture or mandibular dislocation. No destructive process. Orbits: Bilateral lens replacement. Orbits are otherwise unremarkable. Sinuses: Near-complete opacification left maxillary sinus with osseous changes suggestive of chronic left maxillary sinusitis. Paranasal sinuses are otherwise clear. Soft tissues: Aeration along the midline frontal scalp. CT CERVICAL SPINE FINDINGS Alignment: Grade 1 anterolisthesis of C4 on C5 and C7 on T1. Skull base and vertebrae: No acute fracture. No primary bone lesion or focal pathologic process. Soft tissues and spinal canal: No prevertebral fluid or swelling. No visible canal hematoma. Disc levels: There is likely at least moderate spinal canal stenosis at C4-C5 secondary to be large disc bulge. Upper chest: Negative. Other: None IMPRESSION: CT  HEAD: 1. No acute intracranial abnormality. 2. Chronic left cerebellar infarct. CT MAXILLOFACIAL: 1. No acute facial bone fracture. 2. Chronic left maxillary sinusitis. CT CERVICAL SPINE: 1. No acute cervical spine fracture. 2. Likely at least moderate spinal canal stenosis at C4-C5 secondary to large disc bulge. Electronically Signed   By: Marin Roberts M.D.   On: 07/23/2022 10:47   CT Cervical Spine Wo Contrast  Result Date:  07/23/2022 CLINICAL DATA:  Facial trauma EXAM: CT HEAD WITHOUT CONTRAST CT MAXILLOFACIAL WITHOUT CONTRAST CT CERVICAL SPINE WITHOUT CONTRAST TECHNIQUE: Multidetector CT imaging of the head, cervical spine, and maxillofacial structures were performed using the standard protocol without intravenous contrast. Multiplanar CT image reconstructions of the cervical spine and maxillofacial structures were also generated. RADIATION DOSE REDUCTION: This exam was performed according to the departmental dose-optimization program which includes automated exposure control, adjustment of the mA and/or kV according to patient size and/or use of iterative reconstruction technique. COMPARISON:  CT Head 07/16/22 FINDINGS: CT HEAD FINDINGS Brain: No evidence of acute infarction, hemorrhage, extra-axial collection or mass lesion/mass effect. Chronic left cerebellar infarct. Unchanged size and shape of the ventricular system. Vascular: No hyperdense vessel or unexpected calcification. Skull: Normal. Negative for fracture or focal lesion. Other: None CT MAXILLOFACIAL FINDINGS Osseous: No fracture or mandibular dislocation. No destructive process. Orbits: Bilateral lens replacement. Orbits are otherwise unremarkable. Sinuses: Near-complete opacification left maxillary sinus with osseous changes suggestive of chronic left maxillary sinusitis. Paranasal sinuses are otherwise clear. Soft tissues: Aeration along the midline frontal scalp. CT CERVICAL SPINE FINDINGS Alignment: Grade 1 anterolisthesis of C4 on C5 and C7  on T1. Skull base and vertebrae: No acute fracture. No primary bone lesion or focal pathologic process. Soft tissues and spinal canal: No prevertebral fluid or swelling. No visible canal hematoma. Disc levels: There is likely at least moderate spinal canal stenosis at C4-C5 secondary to be large disc bulge. Upper chest: Negative. Other: None IMPRESSION: CT HEAD: 1. No acute intracranial abnormality. 2. Chronic left cerebellar infarct. CT MAXILLOFACIAL: 1. No acute facial bone fracture. 2. Chronic left maxillary sinusitis. CT CERVICAL SPINE: 1. No acute cervical spine fracture. 2. Likely at least moderate spinal canal stenosis at C4-C5 secondary to large disc bulge. Electronically Signed   By: Marin Roberts M.D.   On: 07/23/2022 10:47   CT Maxillofacial Wo Contrast  Result Date: 07/23/2022 CLINICAL DATA:  Facial trauma EXAM: CT HEAD WITHOUT CONTRAST CT MAXILLOFACIAL WITHOUT CONTRAST CT CERVICAL SPINE WITHOUT CONTRAST TECHNIQUE: Multidetector CT imaging of the head, cervical spine, and maxillofacial structures were performed using the standard protocol without intravenous contrast. Multiplanar CT image reconstructions of the cervical spine and maxillofacial structures were also generated. RADIATION DOSE REDUCTION: This exam was performed according to the departmental dose-optimization program which includes automated exposure control, adjustment of the mA and/or kV according to patient size and/or use of iterative reconstruction technique. COMPARISON:  CT Head 07/16/22 FINDINGS: CT HEAD FINDINGS Brain: No evidence of acute infarction, hemorrhage, extra-axial collection or mass lesion/mass effect. Chronic left cerebellar infarct. Unchanged size and shape of the ventricular system. Vascular: No hyperdense vessel or unexpected calcification. Skull: Normal. Negative for fracture or focal lesion. Other: None CT MAXILLOFACIAL FINDINGS Osseous: No fracture or mandibular dislocation. No destructive process. Orbits:  Bilateral lens replacement. Orbits are otherwise unremarkable. Sinuses: Near-complete opacification left maxillary sinus with osseous changes suggestive of chronic left maxillary sinusitis. Paranasal sinuses are otherwise clear. Soft tissues: Aeration along the midline frontal scalp. CT CERVICAL SPINE FINDINGS Alignment: Grade 1 anterolisthesis of C4 on C5 and C7 on T1. Skull base and vertebrae: No acute fracture. No primary bone lesion or focal pathologic process. Soft tissues and spinal canal: No prevertebral fluid or swelling. No visible canal hematoma. Disc levels: There is likely at least moderate spinal canal stenosis at C4-C5 secondary to be large disc bulge. Upper chest: Negative. Other: None IMPRESSION: CT HEAD: 1. No acute intracranial abnormality. 2. Chronic left  cerebellar infarct. CT MAXILLOFACIAL: 1. No acute facial bone fracture. 2. Chronic left maxillary sinusitis. CT CERVICAL SPINE: 1. No acute cervical spine fracture. 2. Likely at least moderate spinal canal stenosis at C4-C5 secondary to large disc bulge. Electronically Signed   By: Marin Roberts M.D.   On: 07/23/2022 10:47   DG Wrist Complete Left  Result Date: 07/23/2022 CLINICAL DATA:  Fall with injury. Patient reports cannula microwave to the trash and tripped and fell left wrist pain. EXAM: LEFT WRIST - COMPLETE 3+ VIEW COMPARISON:  None available FINDINGS: Mildly decreased bone mineralization. There is mild calcific density just medial to the triquetrum on frontal view. No definitive adjacent cortical defect is seen, however recommend clinical correlation for possible tiny superficial fracture. Mild thumb carpometacarpal joint space narrowing and peripheral degenerative spurring. Mild vascular calcifications. IMPRESSION: There is age indeterminate mild calcific density just medial to the triquetrum on frontal view. No definitive adjacent cortical defect is seen, however recommend clinical correlation for possible tiny superficial  fracture. Electronically Signed   By: Yvonne Kendall M.D.   On: 07/23/2022 10:00    Procedures .Marland KitchenLaceration Repair  Date/Time: 07/23/2022 3:38 PM  Performed by: Pat Kocher, PA Authorized by: Pat Kocher, PA   Consent:    Consent obtained:  Verbal   Consent given by:  Patient   Risks, benefits, and alternatives were discussed: yes     Risks discussed:  Infection, pain, poor cosmetic result, poor wound healing and need for additional repair Universal protocol:    Procedure explained and questions answered to patient or proxy's satisfaction: yes     Patient identity confirmed:  Verbally with patient and arm band Laceration details:    Location:  Face   Face location:  Forehead   Length (cm):  3 Exploration:    Hemostasis achieved with:  Direct pressure and LET Treatment:    Area cleansed with:  Shur-Clens   Amount of cleaning:  Standard   Irrigation solution:  Sterile water   Debridement:  None Skin repair:    Repair method:  Sutures   Suture size:  4-0   Suture material:  Prolene   Suture technique:  Simple interrupted   Number of sutures:  6 Approximation:    Approximation:  Close Repair type:    Repair type:  Simple Post-procedure details:    Dressing:  Open (no dressing)   Procedure completion:  Tolerated well, no immediate complications     Medications Ordered in ED Medications  lidocaine-EPINEPHrine-tetracaine (LET) topical gel (has no administration in time range)  silver nitrate applicators applicator 2 Stick (has no administration in time range)  acetaminophen (TYLENOL) tablet 650 mg (650 mg Oral Given 07/23/22 0946)  sodium chloride 0.9 % bolus 1,000 mL (1,000 mLs Intravenous New Bag/Given 07/23/22 1108)  amLODipine (NORVASC) tablet 10 mg (10 mg Oral Given 07/23/22 1102)  insulin aspart (novoLOG) injection 10 Units (10 Units Subcutaneous Given 07/23/22 1103)  lidocaine-EPINEPHrine-tetracaine (LET) topical gel (3 mLs Topical Given 07/23/22 1102)    ED  Course/ Medical Decision Making/ A&P Clinical Course as of 07/23/22 1537  Mon Jul 23, 4761  4437 82 year old male with history as listed in chart below who presented after mechanical fall walking outside holding a microwave and then falling forward and hitting face on the ground.  No loss of consciousness was able to stand and ambulate.  Complaining of some pain in left wrist and head where he has linear mid forehead laceration hemostatic.  He is not  on blood thinners.  He is neurologically intact. [VB]  [id number] Labs reviewed, hyperglycemia 527 without anion gap and normal bicarbonate 25 and pH 7.336.  Not consistent with DKA. Cr 1.63 slightly elevated from baseline.  Mild anemia hemoglobin 12.1. [VB]    Clinical Course User Index [VB] Elgie Congo, MD                             Medical Decision Making Amount and/or Complexity of Data Reviewed Labs: ordered. Radiology: ordered. ECG/medicine tests: ordered.  Risk OTC drugs. Prescription drug management. Decision regarding hospitalization.   This patient presents to the ED with chief complaint(s) of fall with facial injuries with pertinent past medical history of DMII, HTN, HLD, CAD, anemia.  The complaint involves an extensive differential diagnosis and also carries with it a high risk of complications and morbidity.    The differential diagnosis includes intracranial hemorrhage, acute skull fracture, acute facial fractures, HHNS, hyperglycemia, hypoglycemia, hypotension, new gait instability, electrolyte abnormality, hypertensive encephalopathy    The initial plan is to obtain imaging of head, neck, and face as well as baseline labs; CBG was over 500  Additional history obtained: Additional history obtained from EMS  Records reviewed [facility]; also reviewed notes from last weeks ED visit where patient had fall and concerns for new gait instability, patient's daughter at that time stated he is not taking care of  himself, has signs of dementia, and is potentially not taking his medications  Initial Assessment:   Exam significant for a laceration to the medial forehead with minimal bleeding, appears to be superficial but will need repair.  He also has avulsion of skin over the bridge of his nose.  Jaw with normal movement and occlusion.  No obvious palpable skull fractures, no Battle's sign, or raccoon eyes.  Cervical spine without abnormality.  Left wrist tender over distal radius without obvious deformity or swelling.    Independent ECG/labs interpretation:  The following labs were independently interpreted:  CBC with mild anemia, no leukocytosis.  Metabolic panel significant for hyperglycemia at 527, elevated creatinine above patient's baseline, and hyponatremia likely secondary to hyperglycemia.  UA with glucosuria and trace Hgb, but no evidence of UTI.  Venous blood gas with low pO2, but otherwise unremarkable.  Independent visualization and interpretation of imaging: I independently visualized the following imaging with scope of interpretation limited to determining acute life threatening conditions related to emergency care: left wrist x-ray, CT head, neck, and maxillofacial, which revealed age-indeterminate abnormality to the low triquetrum, patient did not have tenderness over this area.  Treatment and Reassessment: Patient's facial pain treated with Tylenol.  Patient laceration was repaired, see procedure note for more details.  Avulsion to the bridge of the nose continuously bleeding despite multiple times with pressure dressing.  Silver nitrate sticks were used for hemostasis with success.  Consultations obtained:   I requested consultation with on-call hospitalist and spoke with Neva Seat who agreed with hospital admission.   Disposition:   Patient to be admitted to [facility] for evaluation and treatment of recurrent falls.           Final Clinical Impression(s) / ED  Diagnoses Final diagnoses:  Hypertensive urgency  Hyperglycemia  Injury due to fall, initial encounter  Laceration of forehead, initial encounter  Recurrent falls  Altered mental status, unspecified altered mental status type    Rx / DC Orders ED Discharge Orders  None         Pat Kocher, [place] 07/23/22 1537    Pat Kocher, [place] 07/23/22 1540    Elgie Congo, MD 07/23/22 [phone]

## 2022-07-23 NOTE — ED Notes (Signed)
Thomas with [facility] called for transport

## 2022-07-23 NOTE — ED Notes (Signed)
Attempted to give report to [facility]. Will call back.

## 2022-07-23 NOTE — ED Triage Notes (Signed)
Pt arrives to ED via [facility] with c/o unwitnessed fall. Per EMS pt was found laying on the pavement infront of his hous eby bystanders. Pt notes that he was carrying a microwave to a trash can and tripped and fell causing him to hit his head.

## 2022-07-24 ENCOUNTER — Observation Stay (HOSPITAL_COMMUNITY): Payer: Medicare HMO

## 2022-07-24 DIAGNOSIS — Z1152 Encounter for screening for COVID-19: Secondary | ICD-10-CM | POA: Diagnosis not present

## 2022-07-24 DIAGNOSIS — R296 Repeated falls: Secondary | ICD-10-CM | POA: Diagnosis present

## 2022-07-24 DIAGNOSIS — R739 Hyperglycemia, unspecified: Secondary | ICD-10-CM

## 2022-07-24 DIAGNOSIS — I130 Hypertensive heart and chronic kidney disease with heart failure and stage 1 through stage 4 chronic kidney disease, or unspecified chronic kidney disease: Secondary | ICD-10-CM | POA: Diagnosis present

## 2022-07-24 DIAGNOSIS — I152 Hypertension secondary to endocrine disorders: Secondary | ICD-10-CM | POA: Diagnosis present

## 2022-07-24 DIAGNOSIS — R4182 Altered mental status, unspecified: Secondary | ICD-10-CM | POA: Diagnosis not present

## 2022-07-24 DIAGNOSIS — D509 Iron deficiency anemia, unspecified: Secondary | ICD-10-CM | POA: Diagnosis present

## 2022-07-24 DIAGNOSIS — Z794 Long term (current) use of insulin: Secondary | ICD-10-CM | POA: Diagnosis not present

## 2022-07-24 DIAGNOSIS — N1832 Chronic kidney disease, stage 3b: Secondary | ICD-10-CM | POA: Diagnosis present

## 2022-07-24 DIAGNOSIS — R9431 Abnormal electrocardiogram [ECG] [EKG]: Secondary | ICD-10-CM | POA: Diagnosis not present

## 2022-07-24 DIAGNOSIS — N400 Enlarged prostate without lower urinary tract symptoms: Secondary | ICD-10-CM | POA: Diagnosis present

## 2022-07-24 DIAGNOSIS — E785 Hyperlipidemia, unspecified: Secondary | ICD-10-CM | POA: Diagnosis present

## 2022-07-24 DIAGNOSIS — R4189 Other symptoms and signs involving cognitive functions and awareness: Secondary | ICD-10-CM | POA: Diagnosis not present

## 2022-07-24 DIAGNOSIS — Z888 Allergy status to other drugs, medicaments and biological substances status: Secondary | ICD-10-CM | POA: Diagnosis not present

## 2022-07-24 DIAGNOSIS — J45909 Unspecified asthma, uncomplicated: Secondary | ICD-10-CM | POA: Diagnosis present

## 2022-07-24 DIAGNOSIS — W010XXA Fall on same level from slipping, tripping and stumbling without subsequent striking against object, initial encounter: Secondary | ICD-10-CM | POA: Diagnosis present

## 2022-07-24 DIAGNOSIS — Z87891 Personal history of nicotine dependence: Secondary | ICD-10-CM | POA: Diagnosis not present

## 2022-07-24 DIAGNOSIS — Z951 Presence of aortocoronary bypass graft: Secondary | ICD-10-CM | POA: Diagnosis not present

## 2022-07-24 DIAGNOSIS — I10 Essential (primary) hypertension: Secondary | ICD-10-CM | POA: Diagnosis not present

## 2022-07-24 DIAGNOSIS — M4802 Spinal stenosis, cervical region: Secondary | ICD-10-CM | POA: Diagnosis present

## 2022-07-24 DIAGNOSIS — K219 Gastro-esophageal reflux disease without esophagitis: Secondary | ICD-10-CM | POA: Diagnosis present

## 2022-07-24 DIAGNOSIS — E1169 Type 2 diabetes mellitus with other specified complication: Secondary | ICD-10-CM | POA: Diagnosis present

## 2022-07-24 DIAGNOSIS — Y92009 Unspecified place in unspecified non-institutional (private) residence as the place of occurrence of the external cause: Secondary | ICD-10-CM | POA: Diagnosis not present

## 2022-07-24 DIAGNOSIS — I160 Hypertensive urgency: Secondary | ICD-10-CM | POA: Diagnosis present

## 2022-07-24 DIAGNOSIS — E1122 Type 2 diabetes mellitus with diabetic chronic kidney disease: Secondary | ICD-10-CM | POA: Diagnosis present

## 2022-07-24 DIAGNOSIS — I5032 Chronic diastolic (congestive) heart failure: Secondary | ICD-10-CM | POA: Diagnosis present

## 2022-07-24 DIAGNOSIS — E1165 Type 2 diabetes mellitus with hyperglycemia: Secondary | ICD-10-CM | POA: Diagnosis present

## 2022-07-24 DIAGNOSIS — I2510 Atherosclerotic heart disease of native coronary artery without angina pectoris: Secondary | ICD-10-CM | POA: Diagnosis present

## 2022-07-24 DIAGNOSIS — R4181 Age-related cognitive decline: Secondary | ICD-10-CM | POA: Diagnosis present

## 2022-07-24 DIAGNOSIS — Z79899 Other long term (current) drug therapy: Secondary | ICD-10-CM | POA: Diagnosis not present

## 2022-07-24 DIAGNOSIS — S0181XA Laceration without foreign body of other part of head, initial encounter: Secondary | ICD-10-CM | POA: Diagnosis present

## 2022-07-24 LAB — COMPREHENSIVE METABOLIC PANEL
ALT: 17 U/L (ref 0–44)
AST: 19 U/L (ref 15–41)
Albumin: 3.3 g/dL — ABNORMAL LOW (ref 3.5–5.0)
Alkaline Phosphatase: 75 U/L (ref 38–126)
Anion gap: 10 (ref 5–15)
BUN: 22 mg/dL (ref 8–23)
CO2: 26 mmol/L (ref 22–32)
Calcium: 9.3 mg/dL (ref 8.9–10.3)
Chloride: 101 mmol/L (ref 98–111)
Creatinine, Ser: 1.62 mg/dL — ABNORMAL HIGH (ref 0.61–1.24)
GFR, Estimated: 42 mL/min — ABNORMAL LOW (ref >60)
Glucose, Bld: 116 mg/dL — ABNORMAL HIGH (ref 70–99)
Potassium: 4 mmol/L (ref 3.5–5.1)
Sodium: 137 mmol/L (ref 135–145)
Total Bilirubin: 0.7 mg/dL (ref 0.3–1.2)
Total Protein: 6.3 g/dL — ABNORMAL LOW (ref 6.5–8.1)

## 2022-07-24 LAB — CBC
HCT: 38.5 % — ABNORMAL LOW (ref 39.0–52.0)
Hemoglobin: 12.7 g/dL — ABNORMAL LOW (ref 13.0–17.0)
MCH: 31.6 pg (ref 26.0–34.0)
MCHC: 33 g/dL (ref 30.0–36.0)
MCV: 95.8 fL (ref 80.0–100.0)
Platelets: 175 10*3/uL (ref 150–400)
RBC: 4.02 MIL/uL — ABNORMAL LOW (ref 4.22–5.81)
RDW: 12.7 % (ref 11.5–15.5)
WBC: 8 10*3/uL (ref 4.0–10.5)
nRBC: 0 % (ref 0.0–0.2)

## 2022-07-24 LAB — GLUCOSE, CAPILLARY
Glucose-Capillary: 157 mg/dL — ABNORMAL HIGH (ref 70–99)
Glucose-Capillary: 212 mg/dL — ABNORMAL HIGH (ref 70–99)
Glucose-Capillary: 430 mg/dL — ABNORMAL HIGH (ref 70–99)
Glucose-Capillary: 148 mg/dL — ABNORMAL HIGH (ref 70–99)
Glucose-Capillary: 217 mg/dL — ABNORMAL HIGH (ref 70–99)

## 2022-07-24 LAB — ECHOCARDIOGRAM COMPLETE
Area-P 1/2: 2.46 cm2
S' Lateral: 1.8 cm

## 2022-07-24 MED ORDER — INSULIN ASPART 100 UNIT/ML IJ SOLN
0.0000 [IU] | Freq: Every day | INTRAMUSCULAR | Status: DC
  Administered 2022-07-24: 2 [IU] via SUBCUTANEOUS

## 2022-07-24 MED ORDER — INSULIN ASPART PROT & ASPART (70-30 MIX) 100 UNIT/ML ~~LOC~~ SUSP
8.0000 [IU] | Freq: Two times a day (BID) | SUBCUTANEOUS | Status: DC
  Administered 2022-07-24 – 2022-07-25 (×2): 8 [IU] via SUBCUTANEOUS
  Filled 2022-07-24 (×2): qty 10

## 2022-07-24 MED ORDER — INSULIN ASPART 100 UNIT/ML IJ SOLN
20.0000 [IU] | Freq: Once | INTRAMUSCULAR | Status: AC
  Administered 2022-07-24: 20 [IU] via SUBCUTANEOUS

## 2022-07-24 MED ORDER — GLUCERNA SHAKE PO LIQD
237.0000 mL | Freq: Two times a day (BID) | ORAL | Status: DC
  Administered 2022-07-24 – 2022-07-27 (×6): 237 mL via ORAL

## 2022-07-24 MED ORDER — HALOPERIDOL LACTATE 5 MG/ML IJ SOLN
1.0000 mg | Freq: Four times a day (QID) | INTRAMUSCULAR | Status: DC | PRN
  Administered 2022-07-26: 1 mg via INTRAVENOUS
  Filled 2022-07-24 (×2): qty 1

## 2022-07-24 MED ORDER — INSULIN ASPART 100 UNIT/ML IJ SOLN
0.0000 [IU] | Freq: Three times a day (TID) | INTRAMUSCULAR | Status: DC
  Administered 2022-07-24: 2 [IU] via SUBCUTANEOUS
  Administered 2022-07-25: 8 [IU] via SUBCUTANEOUS

## 2022-07-24 MED ORDER — LORAZEPAM 2 MG/ML IJ SOLN: 0.5000 mg | Freq: Once | INTRAMUSCULAR | Status: DC | PRN

## 2022-07-24 NOTE — Progress Notes (Signed)
[facility] PROGRESS NOTE   Justin Salinas [id number] DOB: [birth year] DOA: 07/23/2022  PCP: Vivi Barrack, MD  Brief History/Interval Summary: 82 y.o. male with medical history significant of diabetes, carotid artery disease, CAD status post CABG, anemia, hyperlipidemia, hypertension, GERD, CKD 3, BPH, diastolic dysfunction presenting after sustaining a fall at home.  According to patient's daughter patient has had multiple falls recently.  There was no mention of loss of consciousness during this fall.  Patient apparently has also had some decline in his cognitive function according to his daughter.    Consultants: None  Procedures: Echocardiogram has been ordered    Subjective/Interval History: Patient complains of mild headache.  Denies any chest pain shortness of breath.  He mentions that he has not had multiple falls recently.  He is not concerned about his memory.    Assessment/Plan:  Recurrent falls with concern for cognitive decline Imaging studies did not show any acute findings.  Patient does not have any focal neurological deficits.  Recent MRI brain did not show any acute findings either. Patient was able to answer most of my orientation questions correctly though with some delay and hesitation. Will do metabolic workup and check B12 level, folate levels, TSH, RPR.  PT and OT evaluation.  Diabetes mellitus type 2, uncontrolled with hyperglycemia Apparently on 70/30 insulin at home, 8 units twice daily.  Came in with significantly high glucose levels greater than 500.  May not have been compliant with his home medications.  HbA1c 10.7.  Currently on 7035 units twice a day along with SSI.  Monitor CBGs.  Adjust dose as indicated.  Essential hypertension Came in with significantly elevated blood pressure.  Compliance seems to be an issue.  Currently noted to be on hydralazine, carvedilol, furosemide.  Coronary artery disease status post CABG EKG with  nonspecific T wave changes.  Not seen on previous EKG.  Patient however denies any chest pain.  Troponins were not significantly elevated.  Will proceed with echocardiogram.  Chronic diastolic CHF Follow-up on echocardiogram.  Furosemide being continued.  Monitor electrolytes.  Chronic kidney disease stage IIIb Monitor renal function closely.  Urine output.  Avoid nephrotoxic agents.  BPH Continue with alfuzosin.  Normocytic anemia Check anemia panel.  No evidence for overt blood loss.  DVT Prophylaxis: Lovenox Code Status: Full code Family Communication: No family at bedside.  Will update daughter later today Disposition Plan: To be determined  Status is: Observation The patient will require care spanning > 2 midnights and should be moved to inpatient because: Unsafe discharge due to multiple falls, lives by himself, workup not completed.      Medications: Scheduled:  alfuzosin  10 mg Oral Q breakfast   carvedilol  6.25 mg Oral BID WC   enoxaparin (LOVENOX) injection  40 mg Subcutaneous Q24H   furosemide  40 mg Oral BID   hydrALAZINE  50 mg Oral Q8H   insulin aspart  0-5 Units Subcutaneous QHS   insulin aspart  0-9 Units Subcutaneous TID WC   insulin aspart protamine- aspart  5 Units Subcutaneous BID WC   lidocaine-EPINEPHrine-tetracaine  3 mL Topical Once   silver nitrate applicators  2 Stick Topical Once   simvastatin  40 mg Oral Daily   sodium chloride flush  3 mL Intravenous Q12H   Continuous: [id number] **OR** acetaminophen, polyethylene glycol  Antibiotics: Anti-infectives (From admission, onward)    None       Objective:  Vital Signs  Vitals:   07/23/22 1949  07/24/22 0438 07/24/22 0703 07/24/22 0726  BP: (!) 174/50 (!) 158/64 (!) 185/75 (!) 156/69  Pulse: 67 72 66 72  Resp: 17 17 18 18  $ Temp: 98.2 F (36.8 C) 98.5 F (36.9 C) 98.4 F (36.9 C) 98.1 F (36.7 C)  TempSrc: Oral Oral Oral Oral  SpO2: 100% 96% 99% 98%    Intake/Output  Summary (Last 24 hours) at 07/24/2022 0841 Last data filed at 07/24/2022 0445 Gross per 24 hour  Intake --  Output 375 ml  Net -375 ml   There were no vitals filed for this visit.  General appearance: Awake alert.  In no distress slightly distracted. Bruising noted over his face Resp: Clear to auscultation bilaterally.  Normal effort Cardio: S1-S2 is normal regular.  No S3-S4.  No rubs murmurs or bruit GI: Abdomen is soft.  Nontender nondistended.  Bowel sounds are present normal.  No masses organomegaly Extremities: No edema.  Full range of motion of lower extremities. Neurologic: Alert and oriented x3.  No focal neurological deficits.    Lab Results:  Data Reviewed: I have personally reviewed following labs and reports of the imaging studies  CBC: Recent Labs  Lab 07/23/22 0925 07/23/22 1002 07/24/22 0408  WBC 7.5  --  8.0  NEUTROABS 5.1  --   --   HGB 12.1* 12.2* 12.7*  HCT 36.0* 36.0* 38.5*  MCV 94.7  --  95.8  PLT 173  --  0000000    Basic Metabolic Panel: Recent Labs  Lab 07/23/22 0925 07/23/22 1002 07/23/22 2237 07/24/22 0408  NA 130* 130* 131* 137  K 4.7 4.5 4.4 4.0  CL 96*  --  98 101  CO2 25  --  25 26  GLUCOSE 527*  --  442* 116*  BUN 26*  --  19 22  CREATININE 1.63*  --  1.79* 1.62*  CALCIUM 9.3  --  9.0 9.3    GFR: Estimated Creatinine Clearance: 34.4 mL/min (A) (by C-G formula based on SCr of 1.62 mg/dL (H)).  Liver Function Tests: Recent Labs  Lab 07/24/22 0408  AST 19  ALT 17  ALKPHOS 75  BILITOT 0.7  PROT 6.3*  ALBUMIN 3.3*    HbA1C: Recent Labs    07/23/22 1725  HGBA1C 10.7*    CBG: Recent Labs  Lab 07/23/22 1734 07/23/22 2031 07/23/22 2226 07/24/22 0208 07/24/22 0735  GLUCAP 294* 413* 433* 157* 212*     Recent Results (from the past 240 hour(s))  Resp panel by RT-PCR (RSV, Flu A&B, Covid) Anterior Nasal Swab     Status: None   Collection Time: 07/16/22  1:10 PM   Specimen: Anterior Nasal Swab  Result Value Ref  Range Status   SARS Coronavirus 2 by RT PCR NEGATIVE NEGATIVE Final    Comment: (NOTE) SARS-CoV-2 target nucleic acids are NOT DETECTED.  The SARS-CoV-2 RNA is generally detectable in upper respiratory specimens during the acute phase of infection. The lowest concentration of SARS-CoV-2 viral copies this assay can detect is 138 copies/mL. A negative result does not preclude SARS-Cov-2 infection and should not be used as the sole basis for treatment or other patient management decisions. A negative result may occur with  improper specimen collection/handling, submission of specimen other than nasopharyngeal swab, presence of viral mutation(s) within the areas targeted by this assay, and inadequate number of viral copies(<138 copies/mL). A negative result must be combined with clinical observations, patient history, and epidemiological information. The expected result is Negative.  Fact Sheet for  Patients:  EntrepreneurPulse.com.au  Fact Sheet for Healthcare Providers:  IncredibleEmployment.be  This test is no t yet approved or cleared by the [place] FDA and  has been authorized for detection and/or diagnosis of SARS-CoV-2 by FDA under an Emergency Use Authorization (EUA). This EUA will remain  in effect (meaning this test can be used) for the duration of the COVID-19 declaration under Section 564(b)(1) of the Act, 21 U.S.C.section 360bbb-3(b)(1), unless the authorization is terminated  or revoked sooner.       Influenza A by PCR NEGATIVE NEGATIVE Final   Influenza B by PCR NEGATIVE NEGATIVE Final    Comment: (NOTE) The Xpert Xpress SARS-CoV-2/FLU/RSV plus assay is intended as an aid in the diagnosis of influenza from Nasopharyngeal swab specimens and should not be used as a sole basis for treatment. Nasal washings and aspirates are unacceptable for Xpert Xpress SARS-CoV-2/FLU/RSV testing.  Fact Sheet for  Patients: EntrepreneurPulse.com.au  Fact Sheet for Healthcare Providers: IncredibleEmployment.be  This test is not yet approved or cleared by the [place] FDA and has been authorized for detection and/or diagnosis of SARS-CoV-2 by FDA under an Emergency Use Authorization (EUA). This EUA will remain in effect (meaning this test can be used) for the duration of the COVID-19 declaration under Section 564(b)(1) of the Act, 21 U.S.C. section 360bbb-3(b)(1), unless the authorization is terminated or revoked.     Resp Syncytial Virus by PCR NEGATIVE NEGATIVE Final    Comment: (NOTE) Fact Sheet for Patients: EntrepreneurPulse.com.au  Fact Sheet for Healthcare Providers: IncredibleEmployment.be  This test is not yet approved or cleared by the [place] FDA and has been authorized for detection and/or diagnosis of SARS-CoV-2 by FDA under an Emergency Use Authorization (EUA). This EUA will remain in effect (meaning this test can be used) for the duration of the COVID-19 declaration under Section 564(b)(1) of the Act, 21 U.S.C. section 360bbb-3(b)(1), unless the authorization is terminated or revoked.  Performed at [facility], [address], [place], [place] [address]       Radiology Studies: CT Head Wo Contrast  Result Date: 07/23/2022 CLINICAL DATA:  Facial trauma EXAM: CT HEAD WITHOUT CONTRAST CT MAXILLOFACIAL WITHOUT CONTRAST CT CERVICAL SPINE WITHOUT CONTRAST TECHNIQUE: Multidetector CT imaging of the head, cervical spine, and maxillofacial structures were performed using the standard protocol without intravenous contrast. Multiplanar CT image reconstructions of the cervical spine and maxillofacial structures were also generated. RADIATION DOSE REDUCTION: This exam was performed according to the departmental dose-optimization program which includes automated exposure control,  adjustment of the mA and/or kV according to patient size and/or use of iterative reconstruction technique. COMPARISON:  CT Head 07/16/22 FINDINGS: CT HEAD FINDINGS Brain: No evidence of acute infarction, hemorrhage, extra-axial collection or mass lesion/mass effect. Chronic left cerebellar infarct. Unchanged size and shape of the ventricular system. Vascular: No hyperdense vessel or unexpected calcification. Skull: Normal. Negative for fracture or focal lesion. Other: None CT MAXILLOFACIAL FINDINGS Osseous: No fracture or mandibular dislocation. No destructive process. Orbits: Bilateral lens replacement. Orbits are otherwise unremarkable. Sinuses: Near-complete opacification left maxillary sinus with osseous changes suggestive of chronic left maxillary sinusitis. Paranasal sinuses are otherwise clear. Soft tissues: Aeration along the midline frontal scalp. CT CERVICAL SPINE FINDINGS Alignment: Grade 1 anterolisthesis of C4 on C5 and C7 on T1. Skull base and vertebrae: No acute fracture. No primary bone lesion or focal pathologic process. Soft tissues and spinal canal: No prevertebral fluid or swelling. No visible canal hematoma. Disc levels: There is likely at least  moderate spinal canal stenosis at C4-C5 secondary to be large disc bulge. Upper chest: Negative. Other: None IMPRESSION: CT HEAD: 1. No acute intracranial abnormality. 2. Chronic left cerebellar infarct. CT MAXILLOFACIAL: 1. No acute facial bone fracture. 2. Chronic left maxillary sinusitis. CT CERVICAL SPINE: 1. No acute cervical spine fracture. 2. Likely at least moderate spinal canal stenosis at C4-C5 secondary to large disc bulge. Electronically Signed   By: Marin Roberts M.D.   On: 07/23/2022 10:47   CT Cervical Spine Wo Contrast  Result Date: 07/23/2022 CLINICAL DATA:  Facial trauma EXAM: CT HEAD WITHOUT CONTRAST CT MAXILLOFACIAL WITHOUT CONTRAST CT CERVICAL SPINE WITHOUT CONTRAST TECHNIQUE: Multidetector CT imaging of the head, cervical spine,  and maxillofacial structures were performed using the standard protocol without intravenous contrast. Multiplanar CT image reconstructions of the cervical spine and maxillofacial structures were also generated. RADIATION DOSE REDUCTION: This exam was performed according to the departmental dose-optimization program which includes automated exposure control, adjustment of the mA and/or kV according to patient size and/or use of iterative reconstruction technique. COMPARISON:  CT Head 07/16/22 FINDINGS: CT HEAD FINDINGS Brain: No evidence of acute infarction, hemorrhage, extra-axial collection or mass lesion/mass effect. Chronic left cerebellar infarct. Unchanged size and shape of the ventricular system. Vascular: No hyperdense vessel or unexpected calcification. Skull: Normal. Negative for fracture or focal lesion. Other: None CT MAXILLOFACIAL FINDINGS Osseous: No fracture or mandibular dislocation. No destructive process. Orbits: Bilateral lens replacement. Orbits are otherwise unremarkable. Sinuses: Near-complete opacification left maxillary sinus with osseous changes suggestive of chronic left maxillary sinusitis. Paranasal sinuses are otherwise clear. Soft tissues: Aeration along the midline frontal scalp. CT CERVICAL SPINE FINDINGS Alignment: Grade 1 anterolisthesis of C4 on C5 and C7 on T1. Skull base and vertebrae: No acute fracture. No primary bone lesion or focal pathologic process. Soft tissues and spinal canal: No prevertebral fluid or swelling. No visible canal hematoma. Disc levels: There is likely at least moderate spinal canal stenosis at C4-C5 secondary to be large disc bulge. Upper chest: Negative. Other: None IMPRESSION: CT HEAD: 1. No acute intracranial abnormality. 2. Chronic left cerebellar infarct. CT MAXILLOFACIAL: 1. No acute facial bone fracture. 2. Chronic left maxillary sinusitis. CT CERVICAL SPINE: 1. No acute cervical spine fracture. 2. Likely at least moderate spinal canal stenosis at  C4-C5 secondary to large disc bulge. Electronically Signed   By: Marin Roberts M.D.   On: 07/23/2022 10:47   CT Maxillofacial Wo Contrast  Result Date: 07/23/2022 CLINICAL DATA:  Facial trauma EXAM: CT HEAD WITHOUT CONTRAST CT MAXILLOFACIAL WITHOUT CONTRAST CT CERVICAL SPINE WITHOUT CONTRAST TECHNIQUE: Multidetector CT imaging of the head, cervical spine, and maxillofacial structures were performed using the standard protocol without intravenous contrast. Multiplanar CT image reconstructions of the cervical spine and maxillofacial structures were also generated. RADIATION DOSE REDUCTION: This exam was performed according to the departmental dose-optimization program which includes automated exposure control, adjustment of the mA and/or kV according to patient size and/or use of iterative reconstruction technique. COMPARISON:  CT Head 07/16/22 FINDINGS: CT HEAD FINDINGS Brain: No evidence of acute infarction, hemorrhage, extra-axial collection or mass lesion/mass effect. Chronic left cerebellar infarct. Unchanged size and shape of the ventricular system. Vascular: No hyperdense vessel or unexpected calcification. Skull: Normal. Negative for fracture or focal lesion. Other: None CT MAXILLOFACIAL FINDINGS Osseous: No fracture or mandibular dislocation. No destructive process. Orbits: Bilateral lens replacement. Orbits are otherwise unremarkable. Sinuses: Near-complete opacification left maxillary sinus with osseous changes suggestive of chronic left maxillary sinusitis. Paranasal sinuses  are otherwise clear. Soft tissues: Aeration along the midline frontal scalp. CT CERVICAL SPINE FINDINGS Alignment: Grade 1 anterolisthesis of C4 on C5 and C7 on T1. Skull base and vertebrae: No acute fracture. No primary bone lesion or focal pathologic process. Soft tissues and spinal canal: No prevertebral fluid or swelling. No visible canal hematoma. Disc levels: There is likely at least moderate spinal canal stenosis at C4-C5  secondary to be large disc bulge. Upper chest: Negative. Other: None IMPRESSION: CT HEAD: 1. No acute intracranial abnormality. 2. Chronic left cerebellar infarct. CT MAXILLOFACIAL: 1. No acute facial bone fracture. 2. Chronic left maxillary sinusitis. CT CERVICAL SPINE: 1. No acute cervical spine fracture. 2. Likely at least moderate spinal canal stenosis at C4-C5 secondary to large disc bulge. Electronically Signed   By: Marin Roberts M.D.   On: 07/23/2022 10:47   DG Wrist Complete Left  Result Date: 07/23/2022 CLINICAL DATA:  Fall with injury. Patient reports cannula microwave to the trash and tripped and fell left wrist pain. EXAM: LEFT WRIST - COMPLETE 3+ VIEW COMPARISON:  None available FINDINGS: Mildly decreased bone mineralization. There is mild calcific density just medial to the triquetrum on frontal view. No definitive adjacent cortical defect is seen, however recommend clinical correlation for possible tiny superficial fracture. Mild thumb carpometacarpal joint space narrowing and peripheral degenerative spurring. Mild vascular calcifications. IMPRESSION: There is age indeterminate mild calcific density just medial to the triquetrum on frontal view. No definitive adjacent cortical defect is seen, however recommend clinical correlation for possible tiny superficial fracture. Electronically Signed   By: Yvonne Kendall M.D.   On: 07/23/2022 10:00       LOS: 0 days   [facility] Pager on [email or web address]  07/24/2022, 8:41 AM

## 2022-07-24 NOTE — Progress Notes (Signed)
PT Cancellation Note  Patient Details Name: Justin Salinas MRN: [id number] DOB: [birth year]   Cancelled Treatment:    Reason Eval/Treat Not Completed: Other (comment). Pt eating. Will check back later.   Shary Decamp [facility] 07/24/2022, 9:26 AM [place] Office [phone]

## 2022-07-24 NOTE — TOC Initial Note (Signed)
Transition of Care [facility]) - Initial/Assessment Note    Patient Details  Name: Justin Salinas MRN: [id number] Date of Birth: September 10, 1940  Transition of Care [facility]) CM/SW Contact:    Justin Labrum, RN Phone Number: 07/24/2022, 2:57 PM  Clinical Narrative:                 CM met with the patient's daughter at the bedside, Justin Salinas, while patient was at  a procedure.  The patient's daughter reports that the patient lives alone at his home and she states that her and her brother has hired [facility] [place] and has hired Optometrist 5 hours a day - 7 days at week at the home.  The daughter states that the patient has had increasing memory issues over the past few weeks and has not been able to care for himself at the home when he his unattended  by the nursing assistant through the agency. The daughter states that she is concerned and plans to speak with her brother tonight.  I updated the daughter that SNF is recommended and will discuss with the patient/family once the patient is evaluated by PT.  OT note has only been placed at this time.  I plan to follow up with the family tomorrow to assist with TOC needs - likely SNF placement.  Expected Discharge Plan: Skilled Nursing Facility Barriers to Discharge: Continued Medical Work up   Patient Goals and CMS Choice Patient states their goals for this hospitalization and ongoing recovery are:: Patient confused and unable to state goals CMS Medicare.gov Compare Post Acute Care list provided to:: Patient Represenative (must comment) (Patient's daughter at bedside) Choice offered to / list presented to : Adult [place] ownership interest in [facility].provided to:: Adult Children    Expected Discharge Plan and Services   Discharge Planning Services: CM Consult Post Acute Care Choice: [place] Living arrangements for the past 2 months: Single Family Home                                       Prior Living Arrangements/Services Living arrangements for the past 2 months: Single Family Home Lives with:: Self Patient language and need for interpreter reviewed:: Yes Do you feel safe going back to the place where you live?: No      Need for Family Participation in Patient Care: Yes (Comment) Care giver support system in place?: Yes (comment) Current home services: DME (glucometer at home) Criminal Activity/Legal Involvement Pertinent to Current Situation/Hospitalization: No - Comment as needed  Activities of Daily Living Home Assistive Devices/Equipment: None ADL Screening (condition at time of admission) Patient's cognitive ability adequate to safely complete daily activities?: Yes Is the patient deaf or have difficulty hearing?: Yes Does the patient have difficulty seeing, even when wearing glasses/contacts?: No Does the patient have difficulty concentrating, remembering, or making decisions?: No Patient able to express need for assistance with ADLs?: Yes Does the patient have difficulty dressing or bathing?: No Independently performs ADLs?: Yes (appropriate for developmental age) Does the patient have difficulty walking or climbing stairs?: No Weakness of Legs: Both Weakness of Arms/Hands: None  Permission Sought/Granted Permission sought to share information with : Case Manager, Family Supports       Permission granted to share info w AGENCY: SNF facility  Permission granted to share info w Relationship: daughter, Justin Salinas -  [phone]     Emotional Assessment Appearance:: Appears stated age Attitude/Demeanor/Rapport: Irrational, Suspicious Affect (typically observed): Frustrated Orientation: : Oriented to Self Alcohol / Substance Use: Not Applicable Psych Involvement: No (comment)  Admission diagnosis:  Hyperglycemia [R73.9] Hypertensive urgency [I16.0] Recurrent falls [R29.6] Laceration of forehead, initial encounter  [S01.81XA] Altered mental status, unspecified altered mental status type [R41.82] Injury due to fall, initial encounter [W19.XXXA] Patient Active Problem List   Diagnosis Date Noted   Recurrent falls 07/23/2022   Cognitive decline 07/23/2022   Lumbar radiculopathy 09/23/2020   Atherosclerosis of abdominal aorta ([place]) 02/26/2020   Status post placement of bone anchored hearing aid (BAHA) 05/09/2019   Asymmetric SNHL (sensorineural hearing loss) 04/02/2019   Iron deficiency anemia 10/22/2017   Type 2 diabetes mellitus with hyperglycemia ([place]) 01/03/2017   PVD (posterior vitreous detachment), left eye 02/16/2016   Vitreous syneresis of right eye [id number]   Diastolic dysfunction    Hypertension associated with diabetes ([place])    CKD stage 3 due to type 2 diabetes mellitus ([place])    S/P CABG x 4 05/21/2014   Bilateral dry eyes 01/20/2014   Nuclear cataract, bilateral 12/10/2013   Astigmatism 01/11/2012   Hypermetropia 01/11/2012   Presbyopia 01/11/2012   Carotid artery disease ([place]) 12/01/2007   BPH (benign prostatic hyperplasia) 12/01/2007   Dyslipidemia associated with type 2 diabetes mellitus ([place]) 11/28/2007   CAD (coronary artery disease) 11/28/2007   GERD 11/28/2007   PCP:  Justin Barrack, MD Pharmacy:   [id number]Phone: [phone]: [phone] [place] [facility] [place]Phone: [phone]: [phone] [id number] [place] [address] [place] [id number]: [phone]: [address]one: [phone]: [phone]   Social Determinants of Health (SDOH) Social History: SDOH  Screenings   Food Insecurity: No Food Insecurity (07/23/2022)  Housing: Low Risk  (07/23/2022)  Transportation Needs: No Transportation Needs (07/23/2022)  Utilities: Not At Risk (07/23/2022)  Depression (PHQ2-9): Medium Risk (02/06/2022)  Financial Resource Strain: Low Risk  (06/16/2021)  Physical Activity: Insufficiently Active (06/16/2021)  Social Connections: Moderately Integrated (06/16/2021)  Stress: No Stress Concern Present (06/16/2021)  Tobacco Use: High Risk (07/23/2022)   SDOH Interventions: Housing Interventions: Patient Refused   Readmission Risk Interventions     No data to display

## 2022-07-24 NOTE — Progress Notes (Signed)
Echocardiogram 2D Echocardiogram has been performed.  [place] Deputy Edgerrin Correia RDCS 07/24/2022, 2:30 PM  Patient is mildly verbally abusive to staff, continually saying things such as "nobody in this place knows what they're doing" and he "doesn't want [place] to work on him again." Unsure if this is his baseline, but felt it needed to be documented.

## 2022-07-24 NOTE — Evaluation (Addendum)
Occupational Therapy Evaluation Patient Details Name: Justin Salinas MRN: [id number] DOB: 1940/09/10 Today's Date: 07/24/2022   History of Present Illness Pt is 82 year old presented to [facility] on  07/23/22 after recurrent falls at home and concern for cognitive decline. Imaging studies negative for acute findings. PMH - DM, CAD, CABG, HTN   Clinical Impression   Pt reports independence at baseline with ADLs and functional mobility, lives alone in 2 level home reports he frequently goes up/down stairs to basement. Pt unable to state why he is in the hospital, but then is able to recall with therapist cues. Pt currently needing set up-min A for ADLs, min A for bed mobility,and min guard for transfers. Pt with mild unsteadiness reaching out for external support intermittently. Pt presenting with impairments listed below, will follow acutely. Recommend SNF at d/c.      Recommendations for follow up therapy are one component of a multi-disciplinary discharge planning process, led by the attending physician.  Recommendations may be updated based on patient status, additional functional criteria and insurance authorization.   Follow Up Recommendations  Skilled nursing-short term rehab (<3 hours/day) (unless family able to provide frequent assist at home)     Assistance Recommended at Discharge Frequent or constant Supervision/Assistance  Patient can return home with the following Help with stairs or ramp for entrance;Assistance with cooking/housework;A little help with walking and/or transfers;A little help with bathing/dressing/bathroom;Assist for transportation    Functional Status Assessment  Patient has had a recent decline in their functional status and demonstrates the ability to make significant improvements in function in a reasonable and predictable amount of time.  Equipment Recommendations  BSC/3in1    Recommendations for Other Services PT consult     Precautions / Restrictions  Precautions Precautions: Fall Restrictions Weight Bearing Restrictions: No      Mobility Bed Mobility Overal bed mobility: Needs Assistance Bed Mobility: Supine to Sit     Supine to sit: Min assist     General bed mobility comments: min A for trunk elevation    Transfers Overall transfer level: Needs assistance Equipment used: None Transfers: Sit to/from Stand Sit to Stand: Min guard                  Balance Overall balance assessment: Needs assistance Sitting-balance support: Feet supported Sitting balance-Leahy Scale: Good     Standing balance support: During functional activity Standing balance-Leahy Scale: Fair Standing balance comment: mild unsteadiness/reaching out for external support                           ADL either performed or assessed with clinical judgement   ADL Overall ADL's : Needs assistance/impaired Eating/Feeding: Set up;Sitting   Grooming: Set up;Oral care;Standing   Upper Body Bathing: Minimal assistance   Lower Body Bathing: Minimal assistance   Upper Body Dressing : Minimal assistance   Lower Body Dressing: Minimal assistance   Toilet Transfer: Minimal assistance   Toileting- Clothing Manipulation and Hygiene: Minimal assistance       Functional mobility during ADLs: Minimal assistance       Vision   Vision Assessment?: No apparent visual deficits     Perception Perception Perception Tested?: No   Praxis Praxis Praxis tested?: Not tested    Pertinent Vitals/Pain Pain Assessment Pain Assessment: No/denies pain     Hand Dominance Right   Extremity/Trunk Assessment Upper Extremity Assessment Upper Extremity Assessment: Generalized weakness   Lower Extremity Assessment Lower  Extremity Assessment: Defer to PT evaluation   Cervical / Trunk Assessment Cervical / Trunk Assessment: Normal   Communication Communication Communication: HOH   Cognition Arousal/Alertness: Awake/alert Behavior  During Therapy: Impulsive, Restless, WFL for tasks assessed/performed Overall Cognitive Status: No family/caregiver present to determine baseline cognitive functioning                                 General Comments: follows commands appopriately, mildly impulsive and perseverative on wanting his breakfast. Unable to state why he is at the hospital     General Comments  VSS on RA    Exercises     Shoulder Instructions      Home Living Family/patient expects to be discharged to:: Private residence Living Arrangements: Alone Available Help at Discharge: Family;Available PRN/intermittently Type of Home: House Home Access: Stairs to enter Entrance Stairs-Number of Steps: 4   Home Layout: Two level;Laundry or work area in basement     [employer] Shower/Tub: Aeronautical engineer: [employer] - single point          Prior Functioning/Environment Prior Level of Function : Independent/Modified Independent             Mobility Comments: no AD use ADLs Comments: reports ind        OT Problem List: Decreased strength;Decreased range of motion;Decreased activity tolerance;Impaired balance (sitting and/or standing);Decreased cognition;Decreased safety awareness      OT Treatment/Interventions: Self-care/ADL training;Therapeutic exercise;Energy conservation;DME and/or AE instruction;Therapeutic activities;Patient/family education;Balance training;Cognitive remediation/compensation    OT Goals(Current goals can be found in the care plan section) Acute Rehab OT Goals Patient Stated Goal: to eat breakfast OT Goal Formulation: With patient Time For Goal Achievement: 08/07/22 Potential to Achieve Goals: Good ADL Goals Pt Will Perform Upper Body Dressing: with modified independence;sitting Pt Will Perform Lower Body Dressing: with modified independence;sitting/lateral leans;sit to/from stand Pt Will Transfer to Toilet: with modified  independence;regular height toilet;ambulating Pt Will Perform Tub/Shower Transfer: Shower transfer;Tub transfer;with modified independence;ambulating  OT Frequency: Min 2X/week    Co-evaluation              AM-PAC OT "6 Clicks" Daily Activity     Outcome Measure Help from another person eating meals?: A Little Help from another person taking care of personal grooming?: A Little Help from another person toileting, which includes using toliet, bedpan, or urinal?: A Little Help from another person bathing (including washing, rinsing, drying)?: A Little Help from another person to put on and taking off regular upper body clothing?: A Little Help from another person to put on and taking off regular lower body clothing?: A Little 6 Click Score: 18   End of Session Equipment Utilized During Treatment: Gait belt Nurse Communication: Mobility status  Activity Tolerance: Patient tolerated treatment well Patient left: in chair;with call bell/phone within reach;with chair alarm set  OT Visit Diagnosis: Unsteadiness on feet (R26.81);Other abnormalities of gait and mobility (R26.89);Muscle weakness (generalized) (M62.81);History of falling (Z91.81);Repeated falls (R29.6)                Time: [id number] OT Time Calculation (min): 34 min Charges:  OT General Charges $OT Visit: 1 Visit OT Evaluation $OT Eval Moderate Complexity: 1 Mod OT Treatments $Self Care/Home Management : 8-22 mins  Renaye Rakers, OTD, OTR/L SecureChat Preferred Acute Rehab [phone]   Ulla Gallo 07/24/2022, 12:17 PM

## 2022-07-24 NOTE — Inpatient Diabetes Management (Signed)
Inpatient Diabetes Program Recommendations  AACE/ADA: New Consensus Statement on Inpatient Glycemic Control (2015)  Target Ranges:  Prepandial:   less than 140 mg/dL      Peak postprandial:   less than 180 mg/dL (1-2 hours)      Critically ill patients:  140 - 180 mg/dL   Lab Results  Component Value Date   GLUCAP 430 (H) 07/24/2022   HGBA1C 10.7 (H) 07/23/2022    Review of Glycemic Control  Diabetes history: DM1 Outpatient Diabetes medications: 70/30 8 units BID, Prandin 1 mg with BF and lunch Current orders for Inpatient glycemic control: 70/30 8 units BID (increased from 5 BID), Novolog 0-9 units TID and 0-5 units QHS  Inpatient Diabetes Program Recommendations:    Novolog 0-15 units TID.  Noted 70/30 increasing this afternoon.    Spoke with patient at bedside along with daughter.  Per daughter he has had mental decline and has forgotten how to check his blood glucose and administer his insulins.  When asked which insulin he takes at home, he cannot recall.  He has had a few falls at home recently and resides alone.  His daughter states that he has had a caregiver for a few hours in the morning and she (daughter) has been coming by his house in the afternoons to ensure he is taking his medications for about a week.  There was a new caregiver on Sunday and medications were not administered as they were supposed to be and he became sick.  Daughter states he stays thirsty and voids often.  He no longer follows a healthy diet.  Ordered LWWD.  Will likely need SNF at DC.  Daughter states current situation at home is unsustainable.  She is asking about a continuous glucose monitor and insulin pump.  Explained he would not be appropriate for an insulin pump recent decline in mentation.  Could possibly use a cgm depending on disposition.  Would not recommend placing one if discharging to SNF but maybe after.  They could talk to endocrinologist, Dr. Dwyane Dee about this when and if appropriate.     Will continue to follow while inpatient.  Thank you, Reche Dixon, MSN, [facility] Coordinator Inpatient Diabetes Program [phone] (team pager from 8a-5p)

## 2022-07-24 NOTE — Evaluation (Signed)
Physical Therapy Evaluation Patient Details Name: Justin Salinas MRN: [id number] DOB: Dec 30, 1940 Today's Date: 07/24/2022  History of Present Illness  Pt is 82 year old presented to [facility] on  07/23/22 after recurrent falls at home and concern for cognitive decline. Imaging studies negative for acute findings. PMH - DM, CAD, CABG, HTN  Clinical Impression  Pt presents to PT with history of multiple falls and demonstrates decr balance and pt remains high fall risk. Per chart family notes cognitive decline. During PT eval pt with very poor memory, high irritability and poor logical reasoning. Perseverating on imagined slights ([facility] trying to starve me since breakfast was after 9, [facility] doing something to mess up the newspaper he got from the nurses station). Could benefit from brief SNF stay to address balance and mobility but likely cognition is going to be the primary long term issue and family will have to decide how to proceed. Even though we are recommending SNF doubt pt will be agreeable.        Recommendations for follow up therapy are one component of a multi-disciplinary discharge planning process, led by the attending physician.  Recommendations may be updated based on patient status, additional functional criteria and insurance authorization.  Follow Up Recommendations Skilled nursing-short term rehab (<3 hours/day) Can patient physically be transported by private vehicle: Yes    Assistance Recommended at Discharge Frequent or constant Supervision/Assistance  Patient can return home with the following  Assistance with cooking/housework;Direct supervision/assist for financial management;Direct supervision/assist for medications management;Help with stairs or ramp for entrance;Assist for transportation    Equipment Recommendations Rollator (4 wheels)  Recommendations for Other Services       Functional Status Assessment Patient has had a recent decline in their functional status and  demonstrates the ability to make significant improvements in function in a reasonable and predictable amount of time.     Precautions / Restrictions Precautions Precautions: Fall Restrictions Weight Bearing Restrictions: No      Mobility  Bed Mobility               General bed mobility comments: Pt up in chair    Transfers Overall transfer level: Needs assistance Equipment used: None Transfers: Sit to/from Stand Sit to Stand: Min assist, Min guard           General transfer comment: Min assist for initial stand for balance due to posterior stagger. Min guard for safety on subsequent stands.    Ambulation/Gait Ambulation/Gait assistance: Min guard Gait Distance (Feet): 175 Feet Assistive device: Rollator (4 wheels), Straight cane Gait Pattern/deviations: Step-through pattern, Decreased stride length, Trunk flexed Gait velocity: decr Gait velocity interpretation: 1.31 - 2.62 ft/sec, indicative of limited community ambulator   General Gait Details: Assist for safety and verbal cues for use of rollator. Initially tried cane but pt reaching for external support with free hand so switched to rollator.  Stairs            Wheelchair Mobility    Modified Rankin (Stroke Patients Only)       Balance Overall balance assessment: Needs assistance Sitting-balance support: Feet supported Sitting balance-Leahy Scale: Good     Standing balance support: During functional activity Standing balance-Leahy Scale: Fair Standing balance comment: mild unsteadiness/reaching out for external support                             Pertinent Vitals/Pain Pain Assessment Pain Assessment: No/denies pain    Home  Living Family/patient expects to be discharged to:: Private residence Living Arrangements: Alone Available Help at Discharge: Family;Available PRN/intermittently Type of Home: House Home Access: Stairs to enter   Entrance Stairs-Number of Steps: 4    Home Layout: Two level;Laundry or work area in [place]: Kasandra Knudsen - single point      Prior Function Prior Level of Function : Independent/Modified Independent             Mobility Comments: no AD use ADLs Comments: reports ind     Hand Dominance   Dominant Hand: Right    Extremity/Trunk Assessment   Upper Extremity Assessment Upper Extremity Assessment: Defer to OT evaluation    Lower Extremity Assessment Lower Extremity Assessment: Generalized weakness    Cervical / Trunk Assessment Cervical / Trunk Assessment: Normal  Communication   Communication: HOH  Cognition Arousal/Alertness: Awake/alert Behavior During Therapy: Impulsive (irritable) Overall Cognitive Status: No family/caregiver present to determine baseline cognitive functioning Area of Impairment: Attention, Memory, Following commands, Safety/judgement, Awareness, Problem solving                   Current Attention Level: Sustained Memory: Decreased short-term memory Following Commands: Follows one step commands consistently Safety/Judgement: Decreased awareness of safety, Decreased awareness of deficits Awareness: Intellectual Problem Solving: Requires verbal cues, Slow processing General Comments: Pt irritable an irrational. Perseverating on [facility] trying to starve him because his breakfast didn't come until after 9AM. When returned to room after amb pt thought he had been sitting somewhere else. Irrational c/o's ie. gave pt a newspaper and he insisted that [facility] had done something to mess up the newspaper concerning [employer] coverage. Didn't remember how to use call button        General Comments      Exercises     Assessment/Plan    PT Assessment Patient needs continued PT services  PT Problem List Decreased strength;Decreased balance;Decreased mobility;Decreased cognition;Decreased safety awareness       PT Treatment Interventions DME instruction;Gait training;Functional  mobility training;Therapeutic activities;Balance training;Therapeutic exercise;Cognitive remediation;Patient/family education;Stair training    PT Goals (Current goals can be found in the Care Plan section)  Acute Rehab PT Goals PT Goal Formulation: With patient Time For Goal Achievement: 08/07/22 Potential to Achieve Goals: Good    Frequency Min 3X/week     Co-evaluation               AM-PAC PT "6 Clicks" Mobility  Outcome Measure Help needed turning from your back to your side while in a flat bed without using bedrails?: A Little Help needed moving from lying on your back to sitting on the side of a flat bed without using bedrails?: A Little Help needed moving to and from a bed to a chair (including a wheelchair)?: A Little Help needed standing up from a chair using your arms (e.g., wheelchair or bedside chair)?: A Little Help needed to walk in hospital room?: A Little Help needed climbing 3-5 steps with a railing? : A Little 6 Click Score: 18    End of Session Equipment Utilized During Treatment: Gait belt Activity Tolerance: Patient tolerated treatment well Patient left: in chair;with call bell/phone within reach;with chair alarm set   PT Visit Diagnosis: Unsteadiness on feet (R26.81);Other abnormalities of gait and mobility (R26.89);Repeated falls (R29.6);Muscle weakness (generalized) (M62.81)    Time: [id number] PT Time Calculation (min) (ACUTE ONLY): 24 min   Charges:   PT Evaluation $PT Eval Moderate Complexity: 1 Mod PT Treatments $Gait Training: 8-22 mins  [place] Office [place] 07/24/2022, 3:15 PM

## 2022-07-25 ENCOUNTER — Other Ambulatory Visit (HOSPITAL_COMMUNITY): Payer: Self-pay

## 2022-07-25 DIAGNOSIS — R739 Hyperglycemia, unspecified: Secondary | ICD-10-CM | POA: Diagnosis not present

## 2022-07-25 DIAGNOSIS — S0181XA Laceration without foreign body of other part of head, initial encounter: Secondary | ICD-10-CM

## 2022-07-25 DIAGNOSIS — R4182 Altered mental status, unspecified: Secondary | ICD-10-CM | POA: Diagnosis not present

## 2022-07-25 DIAGNOSIS — R296 Repeated falls: Secondary | ICD-10-CM | POA: Diagnosis not present

## 2022-07-25 LAB — COMPREHENSIVE METABOLIC PANEL
ALT: 15 U/L (ref 0–44)
AST: 18 U/L (ref 15–41)
Albumin: 3.2 g/dL — ABNORMAL LOW (ref 3.5–5.0)
Alkaline Phosphatase: 76 U/L (ref 38–126)
Anion gap: 10 (ref 5–15)
BUN: 36 mg/dL — ABNORMAL HIGH (ref 8–23)
CO2: 28 mmol/L (ref 22–32)
Calcium: 9.2 mg/dL (ref 8.9–10.3)
Chloride: 93 mmol/L — ABNORMAL LOW (ref 98–111)
Creatinine, Ser: 1.71 mg/dL — ABNORMAL HIGH (ref 0.61–1.24)
GFR, Estimated: 39 mL/min — ABNORMAL LOW (ref >60)
Glucose, Bld: 351 mg/dL — ABNORMAL HIGH (ref 70–99)
Potassium: 4.4 mmol/L (ref 3.5–5.1)
Sodium: 131 mmol/L — ABNORMAL LOW (ref 135–145)
Total Bilirubin: 0.8 mg/dL (ref 0.3–1.2)
Total Protein: 6.2 g/dL — ABNORMAL LOW (ref 6.5–8.1)

## 2022-07-25 LAB — IRON AND TIBC
Iron: 71 ug/dL (ref 45–182)
Saturation Ratios: 33 % (ref 17.9–39.5)
TIBC: 218 ug/dL — ABNORMAL LOW (ref 250–450)
UIBC: 147 ug/dL

## 2022-07-25 LAB — RETICULOCYTES
Immature Retic Fract: 13.2 % (ref 2.3–15.9)
RBC.: 4.01 MIL/uL — ABNORMAL LOW (ref 4.22–5.81)
Retic Count, Absolute: 76.6 10*3/uL (ref 19.0–186.0)
Retic Ct Pct: 1.9 % (ref 0.4–3.1)

## 2022-07-25 LAB — CBC
HCT: 38.2 % — ABNORMAL LOW (ref 39.0–52.0)
Hemoglobin: 13 g/dL (ref 13.0–17.0)
MCH: 32.1 pg (ref 26.0–34.0)
MCHC: 34 g/dL (ref 30.0–36.0)
MCV: 94.3 fL (ref 80.0–100.0)
Platelets: 187 10*3/uL (ref 150–400)
RBC: 4.05 MIL/uL — ABNORMAL LOW (ref 4.22–5.81)
RDW: 12.6 % (ref 11.5–15.5)
WBC: 6.7 10*3/uL (ref 4.0–10.5)
nRBC: 0 % (ref 0.0–0.2)

## 2022-07-25 LAB — GLUCOSE, CAPILLARY
Glucose-Capillary: 278 mg/dL — ABNORMAL HIGH (ref 70–99)
Glucose-Capillary: 350 mg/dL — ABNORMAL HIGH (ref 70–99)
Glucose-Capillary: 284 mg/dL — ABNORMAL HIGH (ref 70–99)
Glucose-Capillary: 98 mg/dL (ref 70–99)

## 2022-07-25 LAB — VITAMIN B12: Vitamin B-12: 378 pg/mL (ref 180–914)

## 2022-07-25 LAB — TSH: TSH: 2.481 u[IU]/mL (ref 0.350–4.500)

## 2022-07-25 LAB — HIV ANTIBODY (ROUTINE TESTING W REFLEX): HIV Screen 4th Generation wRfx: NONREACTIVE

## 2022-07-25 LAB — FERRITIN: Ferritin: 116 ng/mL (ref 24–336)

## 2022-07-25 LAB — FOLATE: Folate: 22.2 ng/mL (ref >5.9)

## 2022-07-25 LAB — RPR: RPR Ser Ql: NONREACTIVE

## 2022-07-25 LAB — MAGNESIUM: Magnesium: 1.9 mg/dL (ref 1.7–2.4)

## 2022-07-25 MED ORDER — HYDRALAZINE HCL 20 MG/ML IJ SOLN: 5.0000 mg | Freq: Four times a day (QID) | INTRAMUSCULAR | Status: DC | PRN

## 2022-07-25 MED ORDER — INSULIN ASPART PROT & ASPART (70-30 MIX) 100 UNIT/ML ~~LOC~~ SUSP
16.0000 [IU] | Freq: Every day | SUBCUTANEOUS | Status: DC
  Administered 2022-07-25 – 2022-07-26 (×2): 16 [IU] via SUBCUTANEOUS
  Filled 2022-07-25: qty 10

## 2022-07-25 MED ORDER — INSULIN ASPART PROT & ASPART (70-30 MIX) 100 UNIT/ML ~~LOC~~ SUSP: 20.0000 [IU] | Freq: Every day | SUBCUTANEOUS | Status: DC

## 2022-07-25 MED ORDER — CARVEDILOL 12.5 MG PO TABS
12.5000 mg | ORAL_TABLET | Freq: Two times a day (BID) | ORAL | Status: DC
  Administered 2022-07-25 – 2022-07-27 (×3): 12.5 mg via ORAL
  Filled 2022-07-25 (×4): qty 1

## 2022-07-25 MED ORDER — CYANOCOBALAMIN 1000 MCG/ML IJ SOLN
1000.0000 ug | Freq: Every day | INTRAMUSCULAR | Status: DC
  Administered 2022-07-25 – 2022-07-27 (×3): 1000 ug via SUBCUTANEOUS
  Filled 2022-07-25 (×3): qty 1

## 2022-07-25 MED ORDER — INSULIN ASPART PROT & ASPART (70-30 MIX) 100 UNIT/ML ~~LOC~~ SUSP
20.0000 [IU] | Freq: Every day | SUBCUTANEOUS | Status: DC
  Administered 2022-07-26: 20 [IU] via SUBCUTANEOUS
  Filled 2022-07-25: qty 10

## 2022-07-25 MED ORDER — INSULIN ASPART PROT & ASPART (70-30 MIX) 100 UNIT/ML ~~LOC~~ SUSP
12.0000 [IU] | Freq: Once | SUBCUTANEOUS | Status: AC
  Administered 2022-07-25: 12 [IU] via SUBCUTANEOUS
  Filled 2022-07-25: qty 10

## 2022-07-25 NOTE — TOC Benefit Eligibility Note (Signed)
Patient Teacher, English as a foreign language completed.    The patient is currently admitted and upon discharge could be taking Lantus Pen.  The current 30 day co-pay is $35.00.   The patient is currently admitted and upon discharge could be taking Novolog Pen.  The current 30 day co-pay is $35.00.   The patient is insured through [place], [place] Patient Advocate Specialist [place] Patient Advocate Team Direct Number: [phone]  Fax: [phone]

## 2022-07-25 NOTE — Progress Notes (Signed)
Physical Therapy Treatment Patient Details Name: Justin Salinas MRN: [id number] DOB: [birth year] Today's Date: 07/25/2022   History of Present Illness Pt is 82 year old presented to [facility] on  07/23/22 after recurrent falls at home and concern for cognitive decline. Imaging studies negative for acute findings. PMH - DM, CAD, CABG, HTN    PT Comments    Pt with improved mood today, eager to get up to walk reporting "I need the exercise." Pt continues to have lack of insight into his lack of balance especially with dynamic activities like turns and head turns, in the presence of generalized weakness. Pt is min guard for bed mobility, ultimately needs modA for transfers and ambulation for LoB, otherwise moves at a contact guard assist level. D/c plans remain appropriate for improving strength and balance. PT will continue to follow acutely.      Recommendations for follow up therapy are one component of a multi-disciplinary discharge planning process, led by the attending physician.  Recommendations may be updated based on patient status, additional functional criteria and insurance authorization.  Follow Up Recommendations  Skilled nursing-short term rehab (<3 hours/day) Can patient physically be transported by private vehicle: Yes   Assistance Recommended at Discharge Frequent or constant Supervision/Assistance  Patient can return home with the following Assistance with cooking/housework;Direct supervision/assist for financial management;Direct supervision/assist for medications management;Help with stairs or ramp for entrance;Assist for transportation   Equipment Recommendations  Rollator (4 wheels)       Precautions / Restrictions Precautions Precautions: Fall Restrictions Weight Bearing Restrictions: No     Mobility  Bed Mobility Overal bed mobility: Needs Assistance Bed Mobility: Supine to Sit     Supine to sit: Min guard, HOB elevated     General bed mobility comments: min  guard for safety, use of bedrail to pull to EoB    Transfers Overall transfer level: Needs assistance Equipment used: Rolling walker (2 wheels) Transfers: Sit to/from Stand Sit to Stand: Min guard, Mod assist           General transfer comment: min guard for power up but mod A for bringing CoG ove BoS to steady, increased posterior lean    Ambulation/Gait Ambulation/Gait assistance: Mod assist, Min assist Gait Distance (Feet): 270 Feet   Gait Pattern/deviations: Step-through pattern, Decreased stride length, Trunk flexed Gait velocity: decr Gait velocity interpretation: 1.31 - 2.62 ft/sec, indicative of limited community ambulator   General Gait Details: provided contact level min A for majority of ambulation had 2x LoB, once with turning that required modA for steadying, vc for keeping RW on floor with turning, and additional LoB with pt lookinging into a room while he is passing vc for not watching where he is going,       Balance Overall balance assessment: Needs assistance Sitting-balance support: Feet supported Sitting balance-Leahy Scale: Good     Standing balance support: During functional activity Standing balance-Leahy Scale: Poor Standing balance comment: initially posterior lean with power up requiring assist to overcome                            Cognition Arousal/Alertness: Awake/alert Behavior During Therapy: Impulsive (slightly) Overall Cognitive Status: No family/caregiver present to determine baseline cognitive functioning Area of Impairment: Attention, Memory, Following commands, Safety/judgement, Awareness, Problem solving                   Current Attention Level: Sustained Memory: Decreased short-term memory Following Commands: Follows  one step commands consistently, Follows multi-step commands consistently Safety/Judgement: Decreased awareness of safety, Decreased awareness of deficits Awareness: Intellectual Problem Solving:  Requires verbal cues, Slow processing General Comments: pt in good mood this morning, continues to have poor insight into his decreased balance, processing continues to be a little slowed           General Comments General comments (skin integrity, edema, etc.): VSS on RA      Pertinent Vitals/Pain Pain Assessment Pain Assessment: Faces Faces Pain Scale: Hurts a little bit Pain Location: face Pain Descriptors / Indicators: Sore Pain Intervention(s): Monitored during session     PT Goals (current goals can now be found in the care plan section) Acute Rehab PT Goals PT Goal Formulation: With patient Time For Goal Achievement: 08/07/22 Potential to Achieve Goals: Good Progress towards PT goals: Progressing toward goals    Frequency    Min 3X/week      PT Plan Current plan remains appropriate       AM-PAC PT "6 Clicks" Mobility   Outcome Measure  Help needed turning from your back to your side while in a flat bed without using bedrails?: A Little Help needed moving from lying on your back to sitting on the side of a flat bed without using bedrails?: A Little Help needed moving to and from a bed to a chair (including a wheelchair)?: A Little Help needed standing up from a chair using your arms (e.g., wheelchair or bedside chair)?: A Little Help needed to walk in hospital room?: A Little Help needed climbing 3-5 steps with a railing? : A Little 6 Click Score: 18    End of Session Equipment Utilized During Treatment: Gait belt Activity Tolerance: Patient tolerated treatment well Patient left: in chair;with call bell/phone within reach;with chair alarm set Nurse Communication: Mobility status PT Visit Diagnosis: Unsteadiness on feet (R26.81);Other abnormalities of gait and mobility (R26.89);Repeated falls (R29.6);Muscle weakness (generalized) (M62.81)     Time: [id number] PT Time Calculation (min) (ACUTE ONLY): 20 min  Charges:  $Gait Training: 8-22 mins                      Justin Salinas B. Migdalia Dk PT, DPT Acute Rehabilitation Services Please use secure chat or  Call Office [phone]    [place] 07/25/2022, 9:30 AM

## 2022-07-25 NOTE — Progress Notes (Signed)
Inpatient Rehabilitation Admissions Coordinator   Rehab consult received. Noted  PT and OT are recommending SNF. [employer] will unlikely approve CIR level rehab for current diagnosis and with therapy recommendations . We will not pursue and sign off. SW working with family on SNF placement.  Danne Baxter, RN, MSN Rehab Admissions Coordinator [phone] 07/25/2022 4:10 PM

## 2022-07-25 NOTE — Progress Notes (Signed)
[facility] PROGRESS NOTE    Progress Note  Eura Rueda Sleeth  [id number] DOB: 20-Sep-1940 DOA: 07/23/2022 PCP: Vivi Barrack, MD     Brief Narrative:   Bethann Punches Davidson is an 82 y.o. male past medical history significant for diabetes mellitus type 2, coronary artery disease, CAD status post CABG anemia hyperlipidemia chronic MCC 3 chronic diastolic heart failure presents after assisted fall at home, daughter relates she has been falling multiple times there is no memory loss no loss of consciousness during the fall.  The daughter relates there have been some decline in cognition   Assessment/Plan:   Recurrent falls with urine for cognitive decline: Imaging shows no acute findings, he is nonfocal on physical exam. Patient had an MRI on 07/16/2022 that showed no acute findings, but did show small remote infarcts with chronic volume loss B12 [id number] TSH 2.4 Folate 22 RPR non-reactive. Physical therapy and Occupational Therapy have evaluated the patient recommended inpatient rehab. Question if he is developing some type of dementia primary relates he has been having cognitive decline over several months  Dyslipidemia associated with type 2 diabetes mellitus (HCC) A1c of 10.4 discontinue sliding scale insulin will increase 7030 continue CBGs ACHS. Will have to go home on a higher dose of 7030. Pharmacy has graciously willing to investigate if he can afford acting insulin plus short acting as an outpatient.  Essential hypertension: Compliance seems to be an issue. Was restarted on hydralazine Coreg and Lasix.  Blood pressure significantly elevated titrate antihypertensive medication will start with Coreg.  CAD (coronary artery disease) Denies any chest pain not significantly elevated 2D echo showed EF of 60% could not optimize imaging for wall motion abnormality there is asymmetric left ventricular hypertrophy and grade 1 diastolic heart failure.  Chronic diastolic heart  failure: Appears euvolemic continue Lasix.  Chronic kidney disease 3 A: Creatinine appears to be at baseline.  BPH: Continue Alfuzosin.  Normocytic anemia: No evidence of blood loss no signs of overt bleeding.   DVT prophylaxis: lovenox Family Communication:none Status is: Inpatient Remains inpatient appropriate because: Medical evaluation for ongoing falls    Code Status:     Code Status Orders  (From admission, onward)           Start     Ordered   07/23/22 1705  Full code  Continuous       Question:  By:  Answer:  Consent: discussion documented in EHR   07/23/22 1706           Code Status History     Date Active Date Inactive Code Status Order ID Comments User Context   09/23/2020 1046 09/23/2020 2253 Full Code [id number]  Earnie Larsson, MD Inpatient   05/21/2014 1330 05/24/2014 1318 Full Code [id number]  Sara Chu Inpatient   05/19/2014 1854 05/21/2014 1330 Full Code [id number]  Blane Ohara, MD Inpatient      Advance Directive Documentation    Flowsheet Row Most Recent Value  Type of Advance Directive Living will  Pre-existing out of facility DNR order (yellow form or pink MOST form) --  "MOST" Form in Place? --         IV Access:   Peripheral IV   Procedures and diagnostic studies:   ECHOCARDIOGRAM COMPLETE  Result Date: 07/24/2022    ECHOCARDIOGRAM REPORT   Patient Name:   OZIL WADLER Date of Exam: 07/24/2022 Medical Rec #:  [id number]          Height:  69.0 in Accession #:    [id number]         Weight:       155.0 lb Date of Birth:  [birth year]           BSA:          1.854 m Patient Age:    77 years           BP:           156/69 mmHg Patient Gender: M                  HR:           79 bpm. Exam Location:  Inpatient Procedure: 2D Echo, Color Doppler and Cardiac Doppler Indications:    R94.31 Abnormal EKG  History:        Patient has prior history of Echocardiogram examinations, most                 recent 05/20/2014.  CAD, Prior CABG; Risk Factors:Hypertension,                 Diabetes and Dyslipidemia.  Sonographer:    Raquel Sarna Senior RDCS Referring Phys: [phone] [facility]  Sonographer Comments: Technically difficult due to rib/lung interference. IMPRESSIONS  1. Left ventricular ejection fraction, by estimation, is 60 to 65%. The left ventricle has normal function. Left ventricular endocardial border not optimally defined to evaluate regional wall motion. There is moderate asymmetric left ventricular hypertrophy of the septal segment. Left ventricular diastolic parameters are consistent with Grade I diastolic dysfunction (impaired relaxation).  2. Right ventricular systolic function is normal. The right ventricular size is normal. Tricuspid regurgitation signal is inadequate for assessing PA pressure.  3. The mitral valve was not well visualized. No evidence of mitral valve regurgitation. Moderate to severe mitral annular calcification.  4. The aortic valve was not well visualized. Aortic valve regurgitation is not visualized. Comparison(s): Prior images unable to be directly viewed, comparison made by report only. Tricuspid regurgitation has improved from prior reporting. FINDINGS  Left Ventricle: Left ventricular ejection fraction, by estimation, is 60 to 65%. The left ventricle has normal function. Left ventricular endocardial border not optimally defined to evaluate regional wall motion. The left ventricular internal cavity size was small. There is moderate asymmetric left ventricular hypertrophy of the septal segment. Left ventricular diastolic parameters are consistent with Grade I diastolic dysfunction (impaired relaxation). Right Ventricle: The right ventricular size is normal. No increase in right ventricular wall thickness. Right ventricular systolic function is normal. Tricuspid regurgitation signal is inadequate for assessing PA pressure. Left Atrium: Left atrial size was normal in size. Right Atrium: Right atrial  size was normal in size. Pericardium: There is no evidence of pericardial effusion. Presence of epicardial fat layer. Mitral Valve: The mitral valve was not well visualized. Moderate to severe mitral annular calcification. No evidence of mitral valve regurgitation. Tricuspid Valve: The tricuspid valve is normal in structure. Tricuspid valve regurgitation is not demonstrated. No evidence of tricuspid stenosis. Aortic Valve: The aortic valve was not well visualized. Aortic valve regurgitation is not visualized. Pulmonic Valve: The pulmonic valve was normal in structure. Pulmonic valve regurgitation is not visualized. No evidence of pulmonic stenosis. Aorta: The aortic root is normal in size and structure. IAS/Shunts: No atrial level shunt detected by color flow Doppler.  LEFT VENTRICLE PLAX 2D LVIDd:         3.20 cm   Diastology LVIDs:  1.80 cm   LV e' medial:    6.31 cm/s LV PW:         1.00 cm   LV E/e' medial:  12.4 LV IVS:        1.30 cm   LV e' lateral:   12.80 cm/s LVOT diam:     2.10 cm   LV E/e' lateral: 6.1 LV SV:         76 LV SV Index:   41 LVOT Area:     3.46 cm  RIGHT VENTRICLE RV S prime:     9.14 cm/s TAPSE (M-mode): 1.8 cm LEFT ATRIUM             Index        RIGHT ATRIUM           Index LA diam:        3.40 cm 1.83 cm/m   RA Area:     18.00 cm LA Vol (A2C):   49.8 ml 26.86 ml/m  RA Volume:   45.10 ml  24.33 ml/m LA Vol (A4C):   53.4 ml 28.81 ml/m LA Biplane Vol: 52.0 ml 28.05 ml/m  AORTIC VALVE LVOT Vmax:   110.00 cm/s LVOT Vmean:  83.400 cm/s LVOT VTI:    0.219 m  AORTA Ao Root diam: 3.30 cm MITRAL VALVE MV Area (PHT): 2.46 cm     SHUNTS MV Decel Time: 308 msec     Systemic VTI:  0.22 m MV E velocity: 78.10 cm/s   Systemic Diam: 2.10 cm MV A velocity: 109.00 cm/s MV E/A ratio:  0.72 Rudean Haskell MD Electronically signed by Rudean Haskell MD Signature Date/Time: 07/24/2022/3:45:01 PM    Final    CT Head Wo Contrast  Result Date: 07/23/2022 CLINICAL DATA:  Facial trauma  EXAM: CT HEAD WITHOUT CONTRAST CT MAXILLOFACIAL WITHOUT CONTRAST CT CERVICAL SPINE WITHOUT CONTRAST TECHNIQUE: Multidetector CT imaging of the head, cervical spine, and maxillofacial structures were performed using the standard protocol without intravenous contrast. Multiplanar CT image reconstructions of the cervical spine and maxillofacial structures were also generated. RADIATION DOSE REDUCTION: This exam was performed according to the departmental dose-optimization program which includes automated exposure control, adjustment of the mA and/or kV according to patient size and/or use of iterative reconstruction technique. COMPARISON:  CT Head 07/16/22 FINDINGS: CT HEAD FINDINGS Brain: No evidence of acute infarction, hemorrhage, extra-axial collection or mass lesion/mass effect. Chronic left cerebellar infarct. Unchanged size and shape of the ventricular system. Vascular: No hyperdense vessel or unexpected calcification. Skull: Normal. Negative for fracture or focal lesion. Other: None CT MAXILLOFACIAL FINDINGS Osseous: No fracture or mandibular dislocation. No destructive process. Orbits: Bilateral lens replacement. Orbits are otherwise unremarkable. Sinuses: Near-complete opacification left maxillary sinus with osseous changes suggestive of chronic left maxillary sinusitis. Paranasal sinuses are otherwise clear. Soft tissues: Aeration along the midline frontal scalp. CT CERVICAL SPINE FINDINGS Alignment: Grade 1 anterolisthesis of C4 on C5 and C7 on T1. Skull base and vertebrae: No acute fracture. No primary bone lesion or focal pathologic process. Soft tissues and spinal canal: No prevertebral fluid or swelling. No visible canal hematoma. Disc levels: There is likely at least moderate spinal canal stenosis at C4-C5 secondary to be large disc bulge. Upper chest: Negative. Other: None IMPRESSION: CT HEAD: 1. No acute intracranial abnormality. 2. Chronic left cerebellar infarct. CT MAXILLOFACIAL: 1. No acute facial  bone fracture. 2. Chronic left maxillary sinusitis. CT CERVICAL SPINE: 1. No acute cervical spine fracture. 2. Likely at least moderate  spinal canal stenosis at C4-C5 secondary to large disc bulge. Electronically Signed   By: Marin Roberts M.D.   On: 07/23/2022 10:47   CT Cervical Spine Wo Contrast  Result Date: 07/23/2022 CLINICAL DATA:  Facial trauma EXAM: CT HEAD WITHOUT CONTRAST CT MAXILLOFACIAL WITHOUT CONTRAST CT CERVICAL SPINE WITHOUT CONTRAST TECHNIQUE: Multidetector CT imaging of the head, cervical spine, and maxillofacial structures were performed using the standard protocol without intravenous contrast. Multiplanar CT image reconstructions of the cervical spine and maxillofacial structures were also generated. RADIATION DOSE REDUCTION: This exam was performed according to the departmental dose-optimization program which includes automated exposure control, adjustment of the mA and/or kV according to patient size and/or use of iterative reconstruction technique. COMPARISON:  CT Head 07/16/22 FINDINGS: CT HEAD FINDINGS Brain: No evidence of acute infarction, hemorrhage, extra-axial collection or mass lesion/mass effect. Chronic left cerebellar infarct. Unchanged size and shape of the ventricular system. Vascular: No hyperdense vessel or unexpected calcification. Skull: Normal. Negative for fracture or focal lesion. Other: None CT MAXILLOFACIAL FINDINGS Osseous: No fracture or mandibular dislocation. No destructive process. Orbits: Bilateral lens replacement. Orbits are otherwise unremarkable. Sinuses: Near-complete opacification left maxillary sinus with osseous changes suggestive of chronic left maxillary sinusitis. Paranasal sinuses are otherwise clear. Soft tissues: Aeration along the midline frontal scalp. CT CERVICAL SPINE FINDINGS Alignment: Grade 1 anterolisthesis of C4 on C5 and C7 on T1. Skull base and vertebrae: No acute fracture. No primary bone lesion or focal pathologic process. Soft tissues  and spinal canal: No prevertebral fluid or swelling. No visible canal hematoma. Disc levels: There is likely at least moderate spinal canal stenosis at C4-C5 secondary to be large disc bulge. Upper chest: Negative. Other: None IMPRESSION: CT HEAD: 1. No acute intracranial abnormality. 2. Chronic left cerebellar infarct. CT MAXILLOFACIAL: 1. No acute facial bone fracture. 2. Chronic left maxillary sinusitis. CT CERVICAL SPINE: 1. No acute cervical spine fracture. 2. Likely at least moderate spinal canal stenosis at C4-C5 secondary to large disc bulge. Electronically Signed   By: Marin Roberts M.D.   On: 07/23/2022 10:47   CT Maxillofacial Wo Contrast  Result Date: 07/23/2022 CLINICAL DATA:  Facial trauma EXAM: CT HEAD WITHOUT CONTRAST CT MAXILLOFACIAL WITHOUT CONTRAST CT CERVICAL SPINE WITHOUT CONTRAST TECHNIQUE: Multidetector CT imaging of the head, cervical spine, and maxillofacial structures were performed using the standard protocol without intravenous contrast. Multiplanar CT image reconstructions of the cervical spine and maxillofacial structures were also generated. RADIATION DOSE REDUCTION: This exam was performed according to the departmental dose-optimization program which includes automated exposure control, adjustment of the mA and/or kV according to patient size and/or use of iterative reconstruction technique. COMPARISON:  CT Head 07/16/22 FINDINGS: CT HEAD FINDINGS Brain: No evidence of acute infarction, hemorrhage, extra-axial collection or mass lesion/mass effect. Chronic left cerebellar infarct. Unchanged size and shape of the ventricular system. Vascular: No hyperdense vessel or unexpected calcification. Skull: Normal. Negative for fracture or focal lesion. Other: None CT MAXILLOFACIAL FINDINGS Osseous: No fracture or mandibular dislocation. No destructive process. Orbits: Bilateral lens replacement. Orbits are otherwise unremarkable. Sinuses: Near-complete opacification left maxillary sinus  with osseous changes suggestive of chronic left maxillary sinusitis. Paranasal sinuses are otherwise clear. Soft tissues: Aeration along the midline frontal scalp. CT CERVICAL SPINE FINDINGS Alignment: Grade 1 anterolisthesis of C4 on C5 and C7 on T1. Skull base and vertebrae: No acute fracture. No primary bone lesion or focal pathologic process. Soft tissues and spinal canal: No prevertebral fluid or swelling. No visible canal hematoma. Disc  levels: There is likely at least moderate spinal canal stenosis at C4-C5 secondary to be large disc bulge. Upper chest: Negative. Other: None IMPRESSION: CT HEAD: 1. No acute intracranial abnormality. 2. Chronic left cerebellar infarct. CT MAXILLOFACIAL: 1. No acute facial bone fracture. 2. Chronic left maxillary sinusitis. CT CERVICAL SPINE: 1. No acute cervical spine fracture. 2. Likely at least moderate spinal canal stenosis at C4-C5 secondary to large disc bulge. Electronically Signed   By: Marin Roberts M.D.   On: 07/23/2022 10:47     Medical Consultants:   None.   Subjective:    Bethann Punches Hodak no complaint pain is controlled.  Objective:    Vitals:   07/24/22 2021 07/25/22 0340 07/25/22 0346 07/25/22 0816  BP: (!) 145/70 (!) 186/63 (!) 176/57 (!) 161/79  Pulse: 70 73 69 80  Resp: 17 16 14 16  $ Temp: 98.3 F (36.8 C) 98.2 F (36.8 C)  97.8 F (36.6 C)  TempSrc: Oral Oral  Oral  SpO2: 98% 94% 94% 97%  Weight:  71.3 kg    Height:  5' 9"$  (1.753 m)     SpO2: 97 % O2 Flow Rate (L/min): 0 L/min FiO2 (%): (!) 0 %  No intake or output data in the 24 hours ending 07/25/22 0954 Filed Weights   07/25/22 0340  Weight: 71.3 kg    Exam: General exam: In no acute distress.  Traumatic face Respiratory system: Good air movement and clear to auscultation. Cardiovascular system: S1 & S2 heard, RRR. No JVD. Gastrointestinal system: Abdomen is nondistended, soft and nontender.  Extremities: No pedal edema. Skin: No rashes, lesions or  ulcers Psychiatry: Judgement and insight appear normal. Mood & affect appropriate.    Data Reviewed:    Labs: Basic Metabolic Panel: Recent Labs  Lab 07/23/22 0925 07/23/22 1002 07/23/22 2237 07/24/22 0408 07/25/22 0644  NA 130* 130* 131* 137 131*  K 4.7 4.5 4.4 4.0 4.4  CL 96*  --  98 101 93*  CO2 25  --  25 26 28  $ GLUCOSE 527*  --  442* 116* 351*  BUN 26*  --  19 22 36*  CREATININE 1.63*  --  1.79* 1.62* 1.71*  CALCIUM 9.3  --  9.0 9.3 9.2  MG  --   --   --   --  1.9   GFR Estimated Creatinine Clearance: 32.7 mL/min (A) (by C-G formula based on SCr of 1.71 mg/dL (H)). Liver Function Tests: Recent Labs  Lab 07/24/22 0408 07/25/22 0644  AST 19 18  ALT 17 15  ALKPHOS 75 76  BILITOT 0.7 0.8  PROT 6.3* 6.2*  ALBUMIN 3.3* 3.2*   No results for input(s): "LIPASE", "AMYLASE" in the last 168 hours. No results for input(s): "AMMONIA" in the last 168 hours. Coagulation profile No results for input(s): "INR", "PROTIME" in the last 168 hours. COVID-19 Labs  Recent Labs    07/25/22 0644  FERRITIN 116    Lab Results  Component Value Date   SARSCOV2NAA NEGATIVE 07/16/2022   [place] NEGATIVE 09/20/2020   SARSCOV2NAA Negative 01/12/2019    CBC: Recent Labs  Lab 07/23/22 0925 07/23/22 1002 07/24/22 0408 07/25/22 0644  WBC 7.5  --  8.0 6.7  NEUTROABS 5.1  --   --   --   HGB 12.1* 12.2* 12.7* 13.0  HCT 36.0* 36.0* 38.5* 38.2*  MCV 94.7  --  95.8 94.3  PLT 173  --  175 187   Cardiac Enzymes: No results for input(s): "CKTOTAL", "  CKMB", "CKMBINDEX", "TROPONINI" in the last 168 hours. BNP (last 3 results) No results for input(s): "PROBNP" in the last 8760 hours. CBG: Recent Labs  Lab 07/24/22 0735 07/24/22 1155 07/24/22 1538 07/24/22 2045 07/25/22 0621  GLUCAP 212* 430* 148* 217* 278*   D-Dimer: No results for input(s): "DDIMER" in the last 72 hours. Hgb A1c: Recent Labs    07/23/22 1725  HGBA1C 10.7*   Lipid Profile: No results for  input(s): "CHOL", "HDL", "LDLCALC", "TRIG", "CHOLHDL", "LDLDIRECT" in the last 72 hours. Thyroid function studies: Recent Labs    07/25/22 0644  TSH 2.481   Anemia work up: Recent Labs    07/25/22 0644  VITAMINB12 378  FOLATE 22.2  FERRITIN 116  TIBC 218*  IRON 71  RETICCTPCT 1.9   Sepsis Labs: Recent Labs  Lab 07/23/22 0925 07/24/22 0408 07/25/22 0644  WBC 7.5 8.0 6.7   Microbiology Recent Results (from the past 240 hour(s))  Resp panel by RT-PCR (RSV, Flu A&B, Covid) Anterior Nasal Swab     Status: None   Collection Time: 07/16/22  1:10 PM   Specimen: Anterior Nasal Swab  Result Value Ref Range Status   SARS Coronavirus 2 by RT PCR NEGATIVE NEGATIVE Final    Comment: (NOTE) SARS-CoV-2 target nucleic acids are NOT DETECTED.  The SARS-CoV-2 RNA is generally detectable in upper respiratory specimens during the acute phase of infection. The lowest concentration of SARS-CoV-2 viral copies this assay can detect is 138 copies/mL. A negative result does not preclude SARS-Cov-2 infection and should not be used as the sole basis for treatment or other patient management decisions. A negative result may occur with  improper specimen collection/handling, submission of specimen other than nasopharyngeal swab, presence of viral mutation(s) within the areas targeted by this assay, and inadequate number of viral copies(<138 copies/mL). A negative result must be combined with clinical observations, patient history, and epidemiological information. The expected result is Negative.  Fact Sheet for Patients:  EntrepreneurPulse.com.au  Fact Sheet for Healthcare Providers:  IncredibleEmployment.be  This test is no t yet approved or cleared by the [place] FDA and  has been authorized for detection and/or diagnosis of SARS-CoV-2 by FDA under an Emergency Use Authorization (EUA). This EUA will remain  in effect (meaning this test can be  used) for the duration of the COVID-19 declaration under Section 564(b)(1) of the Act, 21 U.S.C.section 360bbb-3(b)(1), unless the authorization is terminated  or revoked sooner.       Influenza A by PCR NEGATIVE NEGATIVE Final   Influenza B by PCR NEGATIVE NEGATIVE Final    Comment: (NOTE) The Xpert Xpress SARS-CoV-2/FLU/RSV plus assay is intended as an aid in the diagnosis of influenza from Nasopharyngeal swab specimens and should not be used as a sole basis for treatment. Nasal washings and aspirates are unacceptable for Xpert Xpress SARS-CoV-2/FLU/RSV testing.  Fact Sheet for Patients: EntrepreneurPulse.com.au  Fact Sheet for Healthcare Providers: IncredibleEmployment.be  This test is not yet approved or cleared by the [place] FDA and has been authorized for detection and/or diagnosis of SARS-CoV-2 by FDA under an Emergency Use Authorization (EUA). This EUA will remain in effect (meaning this test can be used) for the duration of the COVID-19 declaration under Section 564(b)(1) of the Act, 21 U.S.C. section 360bbb-3(b)(1), unless the authorization is terminated or revoked.     Resp Syncytial Virus by PCR NEGATIVE NEGATIVE Final    Comment: (NOTE) Fact Sheet for Patients: EntrepreneurPulse.com.au  Fact Sheet for Healthcare Providers: IncredibleEmployment.be  This  test is not yet approved or cleared by the [place] and has been authorized for detection and/or diagnosis of SARS-CoV-2 by FDA under an Emergency Use Authorization (EUA). This EUA will remain in effect (meaning this test can be used) for the duration of the COVID-19 declaration under Section 564(b)(1) of the Act, 21 U.S.C. section 360bbb-3(b)(1), unless the authorization is terminated or revoked.  Performed at [facility], [address] [place], [place] [address]      Medications:    alfuzosin   10 mg Oral Q breakfast   carvedilol  6.25 mg Oral BID WC   enoxaparin (LOVENOX) injection  40 mg Subcutaneous Q24H   feeding supplement (GLUCERNA SHAKE)  237 mL Oral BID BM   furosemide  40 mg Oral BID   hydrALAZINE  50 mg Oral Q8H   insulin aspart  0-15 Units Subcutaneous TID WC   insulin aspart  0-5 Units Subcutaneous QHS   insulin aspart protamine- aspart  8 Units Subcutaneous BID WC   lidocaine-EPINEPHrine-tetracaine  3 mL Topical Once   silver nitrate applicators  2 Stick Topical Once   simvastatin  40 mg Oral Daily   sodium chloride flush  3 mL Intravenous Q12H   Continuous Infusions:    LOS: 1 day   Charlynne Cousins  [facility]  07/25/2022, 9:54 AM

## 2022-07-25 NOTE — TOC Initial Note (Signed)
Transition of Care [facility]) - Initial/Assessment Note    Patient Details  Name: Justin Salinas MRN: [id number] Date of Birth: [birth year]  Transition of Care [facility]) CM/SW Contact:    Vinie Sill, LCSW Phone Number: 07/25/2022, 2:18 PM  Clinical Narrative:                  Per chart review patient is confused. CSW spoke with the patient's daughter and son via speaker phone. CSW introduced self and explained role. CSW advised of recommendation of short term rehab. Patient's daughter reports patient lives home alone but has the support of caregivers during the day and family at night. Family inquired about CIR but now believes SNF is the better option at this time. CSW explained the SNF process. No preferred SNF at this time. Plan is for patient to return home after rehab. All questions answered.   TOC will provide bed offers once available  TOC will continue to follow and assist with discharge planning.   Thurmond Butts, MSW, LCSW Clinical Social Worker    Expected Discharge Plan: Skilled Nursing Facility Barriers to Discharge: Continued Medical Work up   Patient Goals and CMS Choice Patient states their goals for this hospitalization and ongoing recovery are:: Patient confused and unable to state goals CMS Medicare.gov Compare Post Acute Care list provided to:: Patient Represenative (must comment) (Patient's daughter at bedside) Choice offered to / list presented to : Adult [place] ownership interest in [facility].provided to:: Adult Children    Expected Discharge Plan and Services   Discharge Planning Services: CM Consult Post Acute Care Choice: [place] arrangements for the past 2 months: Single Family Home                                      Prior Living Arrangements/Services Living arrangements for the past 2 months: Single Family Home Lives with:: Self Patient language and need for interpreter reviewed::  Yes Do you feel safe going back to the place where you live?: No      Need for Family Participation in Patient Care: Yes (Comment) Care giver support system in place?: Yes (comment) Current home services: DME (glucometer at home) Criminal Activity/Legal Involvement Pertinent to Current Situation/Hospitalization: No - Comment as needed  Activities of Daily Living Home Assistive Devices/Equipment: None ADL Screening (condition at time of admission) Patient's cognitive ability adequate to safely complete daily activities?: Yes Is the patient deaf or have difficulty hearing?: Yes Does the patient have difficulty seeing, even when wearing glasses/contacts?: No Does the patient have difficulty concentrating, remembering, or making decisions?: No Patient able to express need for assistance with ADLs?: Yes Does the patient have difficulty dressing or bathing?: No Independently performs ADLs?: Yes (appropriate for developmental age) Does the patient have difficulty walking or climbing stairs?: No Weakness of Legs: Both Weakness of Arms/Hands: None  Permission Sought/Granted Permission sought to share information with : Case Manager, Family Supports       Permission granted to share info w AGENCY: SNF facility  Permission granted to share info w Relationship: daughter, Ardis Rowan [id number]     Emotional Assessment Appearance:: Appears stated age Attitude/Demeanor/Rapport: Irrational, Suspicious Affect (typically observed): Frustrated Orientation: : Oriented to Self Alcohol / Substance Use: Not Applicable Psych Involvement: No (comment)  Admission diagnosis:  Hyperglycemia [R73.9] Hypertensive urgency [I16.0] Recurrent falls [R29.6] Laceration of forehead, initial encounter [  S01.81XA] Altered mental status, unspecified altered mental status type [R41.82] Injury due to fall, initial encounter [W19.XXXA] Patient Active Problem List   Diagnosis Date Noted   Hyperglycemia  07/24/2022   Recurrent falls 07/23/2022   Cognitive decline 07/23/2022   Lumbar radiculopathy 09/23/2020   Atherosclerosis of abdominal aorta ([place]) 02/26/2020   Status post placement of bone anchored hearing aid (BAHA) 05/09/2019   Asymmetric SNHL (sensorineural hearing loss) 04/02/2019   Iron deficiency anemia 10/22/2017   Type 2 diabetes mellitus with hyperglycemia ([place]) 01/03/2017   PVD (posterior vitreous detachment), left eye 02/16/2016   Vitreous syneresis of right eye [id number]   Diastolic dysfunction    Hypertension associated with diabetes ([place])    CKD stage 3 due to type 2 diabetes mellitus ([place])    S/P CABG x 4 05/21/2014   Bilateral dry eyes 01/20/2014   Nuclear cataract, bilateral 12/10/2013   Astigmatism 01/11/2012   Hypermetropia 01/11/2012   Presbyopia 01/11/2012   Carotid artery disease ([place]) 12/01/2007   BPH (benign prostatic hyperplasia) 12/01/2007   Dyslipidemia associated with type 2 diabetes mellitus ([place]) 11/28/2007   CAD (coronary artery disease) 11/28/2007   GERD 11/28/2007   PCP:  Vivi Barrack, MD Pharmacy:   [id number]Phone: [phone]: [phone] [place]Phone: [phone]: [phone] [id number] [place] [address] [place] [id number]: [phone]: [address]hone: [phone]: [phone]   Social Determinants of Health (SDOH) Social History: SDOH Screenings   Food Insecurity: No Food Insecurity (07/23/2022)  Housing: Low Risk  (07/23/2022)  Transportation Needs: No Transportation Needs (07/23/2022)  Utilities: Not At  Risk (07/23/2022)  Depression (PHQ2-9): Medium Risk (02/06/2022)  Financial Resource Strain: Low Risk  (06/16/2021)  Physical Activity: Insufficiently Active (06/16/2021)  Social Connections: Moderately Integrated (06/16/2021)  Stress: No Stress Concern Present (06/16/2021)  Tobacco Use: High Risk (07/23/2022)   SDOH Interventions: Housing Interventions: Patient Refused   Readmission Risk Interventions     No data to display

## 2022-07-25 NOTE — Progress Notes (Signed)
Mobility Specialist Progress Note   07/25/22 1055  Mobility  Activity Ambulated with assistance in hallway (in recliner before and after ambulation)  Level of Assistance Minimal assist, patient does 75% or more  Assistive Device Front wheel walker  Distance Ambulated (ft) 300 ft  Range of Motion/Exercises Active;All extremities  Activity Response Tolerated well   Patient received in recliner, pleasant and eager to participate. Stood impulsively and attempted to ambulate without use of AD. Required min A for balance and safety secondary to patient standing on top of blankets and other patient belongings. Ambulated with close min guard to min A for safety and needed verbal cues to redirect and stay on task. No overt LOB noted this session but still at risk for falls due to impulsiveness and lack of safety awareness. Returned to room without complaint or incident. Was left in recliner with all needs met, call bell in reach and family present.  Justin Salinas, BS EXP Mobility Specialist Please contact via SecureChat or Rehab office at [phone]

## 2022-07-25 NOTE — NC FL2 (Signed)
MEDICAID FL2 LEVEL OF CARE FORM     IDENTIFICATION  Patient Name: Justin Salinas Birthdate: 1940-08-23 Sex: male Admission Date (Current Location): 07/23/2022  [facility] and [place] Number:  Herbalist and Address:  [facility] [place], [place] [address] [place], [place] [address]      Provider Number: [id number]  Attending Physician Name and Address:  Charlynne Cousins, MD  Relative Name and Phone Number:       Current Level of Care: Hospital Recommended Level of Care: [place] Prior Approval Number:    Date Approved/Denied:   PASRR Number: [id number]  Discharge Plan: SNF    Current Diagnoses: Patient Active Problem List   Diagnosis Date Noted   Hyperglycemia 07/24/2022   Recurrent falls 07/23/2022   Cognitive decline 07/23/2022   Lumbar radiculopathy 09/23/2020   Atherosclerosis of abdominal aorta ([place]) 02/26/2020   Status post placement of bone anchored hearing aid (BAHA) 05/09/2019   Asymmetric SNHL (sensorineural hearing loss) 04/02/2019   Iron deficiency anemia 10/22/2017   Type 2 diabetes mellitus with hyperglycemia ([place]) 01/03/2017   PVD (posterior vitreous detachment), left eye 02/16/2016   Vitreous syneresis of right eye [id number]   Diastolic dysfunction    Hypertension associated with diabetes ([place])    CKD stage 3 due to type 2 diabetes mellitus (HCC)    S/P CABG x 4 05/21/2014   Bilateral dry eyes 01/20/2014   Nuclear cataract, bilateral 12/10/2013   Astigmatism 01/11/2012   Hypermetropia 01/11/2012   Presbyopia 01/11/2012   Carotid artery disease ([place]) 12/01/2007   BPH (benign prostatic hyperplasia) 12/01/2007   Dyslipidemia associated with type 2 diabetes mellitus ([place]) 11/28/2007   CAD (coronary artery disease) 11/28/2007   GERD 11/28/2007    Orientation RESPIRATION BLADDER Height & Weight     Self  Normal Continent Weight: 157 lb 3 oz (71.3 kg) Height:  5' 9"$  (175.3 cm)  BEHAVIORAL  SYMPTOMS/MOOD NEUROLOGICAL BOWEL NUTRITION STATUS      Continent Diet (please see discharge summary)  AMBULATORY STATUS COMMUNICATION OF NEEDS Skin   Limited Assist Verbally Skin abrasions (Arm, Face, Nose)                       Personal Care Assistance Level of Assistance  Bathing, Feeding, Dressing Bathing Assistance: Limited assistance Feeding assistance: Independent Dressing Assistance: Limited assistance     Functional Limitations Info  Sight, Hearing, Speech Sight Info: Impaired Hearing Info: Impaired Speech Info: Adequate    SPECIAL CARE FACTORS FREQUENCY  PT (By licensed PT), OT (By licensed OT)     PT Frequency: 5x per week OT Frequency: 5x per week            Contractures Contractures Info: Not present    Additional Factors Info  Code Status, Allergies Code Status Info: FULL Allergies Info: Exenatide,Linagliptin,Lasix           Current Medications (07/25/2022):  This is the current hospital active medication list Current Facility-Administered Medications  Medication Dose Route Frequency Provider Last Rate Last Admin   acetaminophen (TYLENOL) tablet 650 mg  650 mg Oral Q6H PRN Marcelyn Bruins, MD   650 mg at 07/25/22 [id number]   Or   acetaminophen (TYLENOL) suppository 650 mg  650 mg Rectal Q6H PRN Marcelyn Bruins, MD       alfuzosin (UROXATRAL) 24 hr tablet 10 mg  10 mg Oral Q breakfast Marcelyn Bruins, MD   10 mg at  07/25/22 0624   carvedilol (COREG) tablet 12.5 mg  12.5 mg Oral BID WC Charlynne Cousins, MD       cyanocobalamin (VITAMIN B12) injection 1,000 mcg  1,000 mcg Subcutaneous Daily Charlynne Cousins, MD   1,000 mcg at 07/25/22 1224   enoxaparin (LOVENOX) injection 40 mg  40 mg Subcutaneous Q24H Marcelyn Bruins, MD   40 mg at 07/24/22 1723   feeding supplement (GLUCERNA SHAKE) (GLUCERNA SHAKE) liquid 237 mL  237 mL Oral BID BM Bonnielee Haff, MD   237 mL at 07/25/22 0841   furosemide (LASIX) tablet 40 mg  40 mg Oral BID  Marcelyn Bruins, MD   40 mg at 07/25/22 0841   haloperidol lactate (HALDOL) injection 1 mg  1 mg Intravenous Q6H PRN Bonnielee Haff, MD       hydrALAZINE (APRESOLINE) injection 5 mg  5 mg Intravenous Q6H PRN Shela Leff, MD       hydrALAZINE (APRESOLINE) tablet 50 mg  50 mg Oral Q8H Marcelyn Bruins, MD   50 mg at 07/25/22 [id number]   insulin aspart protamine- aspart (NOVOLOG MIX 70/30) injection 16 Units  16 Units Subcutaneous Q supper Charlynne Cousins, MD       [START ON 07/26/2022] insulin aspart protamine- aspart (NOVOLOG MIX 70/30) injection 20 Units  20 Units Subcutaneous Q breakfast Charlynne Cousins, MD       lidocaine-EPINEPHrine-tetracaine (LET) topical gel  3 mL Topical Once Marcelyn Bruins, MD       LORazepam (ATIVAN) injection 0.5 mg  0.5 mg Intravenous Once PRN Bonnielee Haff, MD       polyethylene glycol (MIRALAX / GLYCOLAX) packet 17 g  17 g Oral Daily PRN Marcelyn Bruins, MD       silver nitrate applicators applicator 2 Stick  2 Stick Topical Once Marcelyn Bruins, MD       simvastatin (ZOCOR) tablet 40 mg  40 mg Oral Daily Marcelyn Bruins, MD   40 mg at 07/25/22 0841   sodium chloride flush (NS) 0.9 % injection 3 mL  3 mL Intravenous Q12H Marcelyn Bruins, MD   3 mL at 07/25/22 [id number]     Discharge Medications: Please see discharge summary for a list of discharge medications.  Relevant Imaging Results:  Relevant Lab Results:   Additional Information SSN [id number]  Vinie Sill, LCSW

## 2022-07-26 DIAGNOSIS — R296 Repeated falls: Secondary | ICD-10-CM | POA: Diagnosis not present

## 2022-07-26 DIAGNOSIS — S0181XA Laceration without foreign body of other part of head, initial encounter: Secondary | ICD-10-CM | POA: Diagnosis not present

## 2022-07-26 DIAGNOSIS — R4182 Altered mental status, unspecified: Secondary | ICD-10-CM | POA: Diagnosis not present

## 2022-07-26 DIAGNOSIS — R739 Hyperglycemia, unspecified: Secondary | ICD-10-CM | POA: Diagnosis not present

## 2022-07-26 LAB — GLUCOSE, CAPILLARY
Glucose-Capillary: 161 mg/dL — ABNORMAL HIGH (ref 70–99)
Glucose-Capillary: 310 mg/dL — ABNORMAL HIGH (ref 70–99)
Glucose-Capillary: 389 mg/dL — ABNORMAL HIGH (ref 70–99)
Glucose-Capillary: 99 mg/dL (ref 70–99)

## 2022-07-26 MED ORDER — ORAL CARE MOUTH RINSE: 15.0000 mL | OROMUCOSAL | Status: DC | PRN

## 2022-07-26 MED ORDER — LISINOPRIL 5 MG PO TABS
5.0000 mg | ORAL_TABLET | Freq: Every day | ORAL | Status: DC
  Administered 2022-07-26: 5 mg via ORAL
  Filled 2022-07-26: qty 1

## 2022-07-26 MED ORDER — INSULIN ASPART PROT & ASPART (70-30 MIX) 100 UNIT/ML ~~LOC~~ SUSP
25.0000 [IU] | Freq: Every day | SUBCUTANEOUS | Status: DC
  Administered 2022-07-27: 25 [IU] via SUBCUTANEOUS
  Filled 2022-07-26: qty 10

## 2022-07-26 NOTE — TOC Progression Note (Signed)
Transition of Care [facility]) - Progression Note    Patient Details  Name: Justin Salinas MRN: [id number] Date of Birth: 1940/06/08  Transition of Care [facility]) CM/SW [place], [place] Phone Number: 07/26/2022, 10:24 AM  Clinical Narrative:     Received message from patient's daughter- she requested to send referral to [facility]- they are unable to admit because they are not in network with [facility].   Thurmond Butts, MSW, LCSW Clinical Social Worker    Expected Discharge Plan: Skilled Nursing Facility Barriers to Discharge: Continued Medical Work up  Expected Discharge Plan and Services   Discharge Planning Services: CM Consult Post Acute Care Choice: [place] Living arrangements for the past 2 months: Single Family Home                                       Social Determinants of Health (SDOH) Interventions SDOH Screenings   Food Insecurity: No Food Insecurity (07/23/2022)  Housing: Low Risk  (07/23/2022)  Transportation Needs: No Transportation Needs (07/23/2022)  Utilities: Not At Risk (07/23/2022)  Depression (PHQ2-9): Medium Risk (02/06/2022)  Financial Resource Strain: Low Risk  (06/16/2021)  Physical Activity: Insufficiently Active (06/16/2021)  Social Connections: Moderately Integrated (06/16/2021)  Stress: No Stress Concern Present (06/16/2021)  Tobacco Use: High Risk (07/23/2022)    Readmission Risk Interventions     No data to display

## 2022-07-26 NOTE — TOC Progression Note (Signed)
Transition of Care [facility]) - Progression Note    Patient Details  Name: Justin Salinas MRN: [id number] Date of Birth: June 03, 1941  Transition of Care [facility]) CM/SW [place], [place] Phone Number: 07/26/2022, 12:43 PM  Clinical Narrative:     CSW spoke with patient's daughter by phone: verbally provided SNF choices. She states she will call CSW back with choice.  Thurmond Butts, MSW, LCSW Clinical Social Worker      Expected Discharge Plan: Skilled Nursing Facility Barriers to Discharge: Continued Medical Work up  Expected Discharge Plan and Services   Discharge Planning Services: CM Consult Post Acute Care Choice: [place] Living arrangements for the past 2 months: Single Family Home                                       Social Determinants of Health (SDOH) Interventions SDOH Screenings   Food Insecurity: No Food Insecurity (07/23/2022)  Housing: Low Risk  (07/23/2022)  Transportation Needs: No Transportation Needs (07/23/2022)  Utilities: Not At Risk (07/23/2022)  Depression (PHQ2-9): Medium Risk (02/06/2022)  Financial Resource Strain: Low Risk  (06/16/2021)  Physical Activity: Insufficiently Active (06/16/2021)  Social Connections: Moderately Integrated (06/16/2021)  Stress: No Stress Concern Present (06/16/2021)  Tobacco Use: High Risk (07/23/2022)    Readmission Risk Interventions     No data to display

## 2022-07-26 NOTE — TOC Progression Note (Signed)
Transition of Care [facility]) - Progression Note    Patient Details  Name: Justin Salinas MRN: [id number] Date of Birth: January 05, 1941  Transition of Care [facility]) CM/SW [place], [place] Phone Number: 07/26/2022, 3:46 PM  Clinical Narrative:    Received call from patient's son- he advised, they have selected [facility]. CSW sent a message to [facility] to confirm bed offer- waiting on response  Waiting on [facility] to confirm bed offer Will start insurance auth once bed is confirmed.  Thurmond Butts, MSW, LCSW Clinical Social Worker      Expected Discharge Plan: Skilled Nursing Facility Barriers to Discharge: Continued Medical Work up  Expected Discharge Plan and Services   Discharge Planning Services: CM Consult Post Acute Care Choice: [place] Living arrangements for the past 2 months: Single Family Home                                       Social Determinants of Health (SDOH) Interventions SDOH Screenings   Food Insecurity: No Food Insecurity (07/23/2022)  Housing: Low Risk  (07/23/2022)  Transportation Needs: No Transportation Needs (07/23/2022)  Utilities: Not At Risk (07/23/2022)  Depression (PHQ2-9): Medium Risk (02/06/2022)  Financial Resource Strain: Low Risk  (06/16/2021)  Physical Activity: Insufficiently Active (06/16/2021)  Social Connections: Moderately Integrated (06/16/2021)  Stress: No Stress Concern Present (06/16/2021)  Tobacco Use: High Risk (07/23/2022)    Readmission Risk Interventions     No data to display

## 2022-07-26 NOTE — Progress Notes (Signed)
[facility] PROGRESS NOTE    Progress Note  Justin Salinas  [id number] DOB: 1940/07/29 DOA: 07/23/2022 PCP: Vivi Barrack, MD     Brief Narrative:   [name] is an 82 y.o. male past medical history significant for diabetes mellitus type 2, coronary artery disease, CAD status post CABG anemia hyperlipidemia chronic MCC 3 chronic diastolic heart failure presents after assisted fall at home, daughter relates she has been falling multiple times there is no memory loss no loss of consciousness during the fall.  The daughter relates there have been some decline in cognition   Assessment/Plan:   Recurrent falls with urine for cognitive decline: Imaging shows no acute findings, he is nonfocal on physical exam. Does not qualify for for inpatient rehab. TOC has been notified for skilled nursing facility placement. Question if he is developing some type of dementia primary relates he has been having cognitive decline over several months  Dyslipidemia associated with type 2 diabetes mellitus (HCC) A1c of 10.4 discontinue sliding scale insulin will increase 70/30 continue CBGs ACHS. Blood glucose seems to be well-controlled continue current dose of 7030  Essential hypertension: Compliance seems to be an issue. Currently on Coreg, Lasix, hydralazine add low-dose lisinopril.  CAD (coronary artery disease) Denies any chest pain not significantly elevated 2D echo showed EF of 60% could not optimize imaging for wall motion abnormality there is asymmetric left ventricular hypertrophy and grade 1 diastolic heart failure.  Chronic diastolic heart failure: Continue beta-blocker Lasix and ACE inhibitor  Chronic kidney disease 3 A: Creatinine appears to be at baseline.  BPH: Continue Alfuzosin.  Normocytic anemia: No evidence of blood loss no signs of overt bleeding.   DVT prophylaxis: lovenox Family Communication:none Status is: Inpatient Remains inpatient  appropriate because: Medical evaluation for ongoing falls    Code Status:     Code Status Orders  (From admission, onward)           Start     Ordered   07/23/22 1705  Full code  Continuous       Question:  By:  Answer:  Consent: discussion documented in EHR   07/23/22 1706           Code Status History     Date Active Date Inactive Code Status Order ID Comments User Context   09/23/2020 1046 09/23/2020 2253 Full Code [id number]  Justin Larsson, MD Inpatient   05/21/2014 1330 05/24/2014 1318 Full Code [id number]  Justin Salinas Inpatient   05/19/2014 1854 05/21/2014 1330 Full Code [id number]  Blane Ohara, MD Inpatient      Advance Directive Documentation    Flowsheet Row Most Recent Value  Type of Advance Directive Living will  Pre-existing out of facility DNR order (yellow form or pink MOST form) --  "MOST" Form in Place? --         IV Access:   Peripheral IV   Procedures and diagnostic studies:   ECHOCARDIOGRAM COMPLETE  Result Date: 07/24/2022    ECHOCARDIOGRAM REPORT   Patient Name:   Justin Salinas Date of Exam: 07/24/2022 Medical Rec #:  [id number]          Height:       69.0 in Accession #:    [id number]         Weight:       155.0 lb Date of Birth:  1940/10/05           BSA:  1.854 m Patient Age:    22 years           BP:           156/69 mmHg Patient Gender: M                  HR:           79 bpm. Exam Location:  Inpatient Procedure: 2D Echo, Color Doppler and Cardiac Doppler Indications:    R94.31 Abnormal EKG  History:        Patient has prior history of Echocardiogram examinations, most                 recent 05/20/2014. CAD, Prior CABG; Risk Factors:Hypertension,                 Diabetes and Dyslipidemia.  Sonographer:    Raquel Sarna Senior RDCS Referring Phys: [phone] [facility]  Sonographer Comments: Technically difficult due to rib/lung interference. IMPRESSIONS  1. Left ventricular ejection fraction, by estimation, is 60 to 65%. The  left ventricle has normal function. Left ventricular endocardial border not optimally defined to evaluate regional wall motion. There is moderate asymmetric left ventricular hypertrophy of the septal segment. Left ventricular diastolic parameters are consistent with Grade I diastolic dysfunction (impaired relaxation).  2. Right ventricular systolic function is normal. The right ventricular size is normal. Tricuspid regurgitation signal is inadequate for assessing PA pressure.  3. The mitral valve was not well visualized. No evidence of mitral valve regurgitation. Moderate to severe mitral annular calcification.  4. The aortic valve was not well visualized. Aortic valve regurgitation is not visualized. Comparison(s): Prior images unable to be directly viewed, comparison made by report only. Tricuspid regurgitation has improved from prior reporting. FINDINGS  Left Ventricle: Left ventricular ejection fraction, by estimation, is 60 to 65%. The left ventricle has normal function. Left ventricular endocardial border not optimally defined to evaluate regional wall motion. The left ventricular internal cavity size was small. There is moderate asymmetric left ventricular hypertrophy of the septal segment. Left ventricular diastolic parameters are consistent with Grade I diastolic dysfunction (impaired relaxation). Right Ventricle: The right ventricular size is normal. No increase in right ventricular wall thickness. Right ventricular systolic function is normal. Tricuspid regurgitation signal is inadequate for assessing PA pressure. Left Atrium: Left atrial size was normal in size. Right Atrium: Right atrial size was normal in size. Pericardium: There is no evidence of pericardial effusion. Presence of epicardial fat layer. Mitral Valve: The mitral valve was not well visualized. Moderate to severe mitral annular calcification. No evidence of mitral valve regurgitation. Tricuspid Valve: The tricuspid valve is normal in  structure. Tricuspid valve regurgitation is not demonstrated. No evidence of tricuspid stenosis. Aortic Valve: The aortic valve was not well visualized. Aortic valve regurgitation is not visualized. Pulmonic Valve: The pulmonic valve was normal in structure. Pulmonic valve regurgitation is not visualized. No evidence of pulmonic stenosis. Aorta: The aortic root is normal in size and structure. IAS/Shunts: No atrial level shunt detected by color flow Doppler.  LEFT VENTRICLE PLAX 2D LVIDd:         3.20 cm   Diastology LVIDs:         1.80 cm   LV e' medial:    6.31 cm/s LV PW:         1.00 cm   LV E/e' medial:  12.4 LV IVS:        1.30 cm   LV e' lateral:   12.80  cm/s LVOT diam:     2.10 cm   LV E/e' lateral: 6.1 LV SV:         76 LV SV Index:   41 LVOT Area:     3.46 cm  RIGHT VENTRICLE RV S prime:     9.14 cm/s TAPSE (M-mode): 1.8 cm LEFT ATRIUM             Index        RIGHT ATRIUM           Index LA diam:        3.40 cm 1.83 cm/m   RA Area:     18.00 cm LA Vol (A2C):   49.8 ml 26.86 ml/m  RA Volume:   45.10 ml  24.33 ml/m LA Vol (A4C):   53.4 ml 28.81 ml/m LA Biplane Vol: 52.0 ml 28.05 ml/m  AORTIC VALVE LVOT Vmax:   110.00 cm/s LVOT Vmean:  83.400 cm/s LVOT VTI:    0.219 m  AORTA Ao Root diam: 3.30 cm MITRAL VALVE MV Area (PHT): 2.46 cm     SHUNTS MV Decel Time: 308 msec     Systemic VTI:  0.22 m MV E velocity: 78.10 cm/s   Systemic Diam: 2.10 cm MV A velocity: 109.00 cm/s MV E/A ratio:  0.72 Rudean Haskell MD Electronically signed by Rudean Haskell MD Signature Date/Time: 07/24/2022/3:45:01 PM    Final      Medical Consultants:   None.   Subjective:    Bethann Punches Zaremba no complaints.  Objective:    Vitals:   07/25/22 1655 07/25/22 1927 07/25/22 2359 07/26/22 0428  BP: (!) 180/70 (!) 140/71 (!) 154/61 (!) 146/63  Pulse:  95 78 79  Resp: 19 19 20 19  $ Temp: 98.2 F (36.8 C) 98.2 F (36.8 C) (!) 97.4 F (36.3 C) (!) 97.5 F (36.4 C)  TempSrc: Oral Oral Oral Oral  SpO2:   96% 95% 97%  Weight:      Height:       SpO2: 97 % O2 Flow Rate (L/min): 0 L/min FiO2 (%): (!) 0 %   Intake/Output Summary (Last 24 hours) at 07/26/2022 0828 Last data filed at 07/25/2022 2357 Gross per 24 hour  Intake --  Output 525 ml  Net -525 ml   Filed Weights   07/25/22 0340  Weight: 71.3 kg    Exam: General exam: In no acute distress. Respiratory system: Good air movement and clear to auscultation. Cardiovascular system: S1 & S2 heard, RRR. No JVD. Gastrointestinal system: Abdomen is nondistended, soft and nontender.  Extremities: No pedal edema. Skin: No rashes, lesions or ulcers Psychiatry: Judgement and insight appear normal. Mood & affect appropriate.  Data Reviewed:    Labs: Basic Metabolic Panel: Recent Labs  Lab 07/23/22 0925 07/23/22 1002 07/23/22 2237 07/24/22 0408 07/25/22 0644  NA 130* 130* 131* 137 131*  K 4.7 4.5 4.4 4.0 4.4  CL 96*  --  98 101 93*  CO2 25  --  25 26 28  $ GLUCOSE 527*  --  442* 116* 351*  BUN 26*  --  19 22 36*  CREATININE 1.63*  --  1.79* 1.62* 1.71*  CALCIUM 9.3  --  9.0 9.3 9.2  MG  --   --   --   --  1.9    GFR Estimated Creatinine Clearance: 32.7 mL/min (A) (by C-G formula based on SCr of 1.71 mg/dL (H)). Liver Function Tests: Recent Labs  Lab 07/24/22 0408 07/25/22 [id number]  AST 19 18  ALT 17 15  ALKPHOS 75 76  BILITOT 0.7 0.8  PROT 6.3* 6.2*  ALBUMIN 3.3* 3.2*    No results for input(s): "LIPASE", "AMYLASE" in the last 168 hours. No results for input(s): "AMMONIA" in the last 168 hours. Coagulation profile No results for input(s): "INR", "PROTIME" in the last 168 hours. COVID-19 Labs  Recent Labs    07/25/22 0644  FERRITIN 116     Lab Results  Component Value Date   SARSCOV2NAA NEGATIVE 07/16/2022   SARSCOV2NAA NEGATIVE 09/20/2020   SARSCOV2NAA Negative 01/12/2019    CBC: Recent Labs  Lab 07/23/22 0925 07/23/22 1002 07/24/22 0408 07/25/22 0644  WBC 7.5  --  8.0 6.7  NEUTROABS 5.1  --    --   --   HGB 12.1* 12.2* 12.7* 13.0  HCT 36.0* 36.0* 38.5* 38.2*  MCV 94.7  --  95.8 94.3  PLT 173  --  175 187    Cardiac Enzymes: No results for input(s): "CKTOTAL", "CKMB", "CKMBINDEX", "TROPONINI" in the last 168 hours. BNP (last 3 results) No results for input(s): "PROBNP" in the last 8760 hours. CBG: Recent Labs  Lab 07/25/22 0621 07/25/22 1128 07/25/22 1654 07/25/22 2059 07/26/22 0632  GLUCAP 278* 350* 284* 98 161*    D-Dimer: No results for input(s): "DDIMER" in the last 72 hours. Hgb A1c: Recent Labs    07/23/22 1725  HGBA1C 10.7*    Lipid Profile: No results for input(s): "CHOL", "HDL", "LDLCALC", "TRIG", "CHOLHDL", "LDLDIRECT" in the last 72 hours. Thyroid function studies: Recent Labs    07/25/22 0644  TSH 2.481    Anemia work up: Recent Labs    07/25/22 0644  VITAMINB12 378  FOLATE 22.2  FERRITIN 116  TIBC 218*  IRON 71  RETICCTPCT 1.9    Sepsis Labs: Recent Labs  Lab 07/23/22 0925 07/24/22 0408 07/25/22 0644  WBC 7.5 8.0 6.7    Microbiology Recent Results (from the past 240 hour(s))  Resp panel by RT-PCR (RSV, Flu A&B, Covid) Anterior Nasal Swab     Status: None   Collection Time: 07/16/22  1:10 PM   Specimen: Anterior Nasal Swab  Result Value Ref Range Status   SARS Coronavirus 2 by RT PCR NEGATIVE NEGATIVE Final    Comment: (NOTE) SARS-CoV-2 target nucleic acids are NOT DETECTED.  The SARS-CoV-2 RNA is generally detectable in upper respiratory specimens during the acute phase of infection. The lowest concentration of SARS-CoV-2 viral copies this assay can detect is 138 copies/mL. A negative result does not preclude SARS-Cov-2 infection and should not be used as the sole basis for treatment or other patient management decisions. A negative result may occur with  improper specimen collection/handling, submission of specimen other than nasopharyngeal swab, presence of viral mutation(s) within the areas targeted by this  assay, and inadequate number of viral copies(<138 copies/mL). A negative result must be combined with clinical observations, patient history, and epidemiological information. The expected result is Negative.  Fact Sheet for Patients:  EntrepreneurPulse.com.au  Fact Sheet for Healthcare Providers:  IncredibleEmployment.be  This test is no t yet approved or cleared by the [place] FDA and  has been authorized for detection and/or diagnosis of SARS-CoV-2 by FDA under an Emergency Use Authorization (EUA). This EUA will remain  in effect (meaning this test can be used) for the duration of the COVID-19 declaration under Section 564(b)(1) of the Act, 21 U.S.C.section 360bbb-3(b)(1), unless the authorization is terminated  or revoked sooner.  Influenza A by PCR NEGATIVE NEGATIVE Final   Influenza B by PCR NEGATIVE NEGATIVE Final    Comment: (NOTE) The Xpert Xpress SARS-CoV-2/FLU/RSV plus assay is intended as an aid in the diagnosis of influenza from Nasopharyngeal swab specimens and should not be used as a sole basis for treatment. Nasal washings and aspirates are unacceptable for Xpert Xpress SARS-CoV-2/FLU/RSV testing.  Fact Sheet for Patients: EntrepreneurPulse.com.au  Fact Sheet for Healthcare Providers: IncredibleEmployment.be  This test is not yet approved or cleared by the [place] FDA and has been authorized for detection and/or diagnosis of SARS-CoV-2 by FDA under an Emergency Use Authorization (EUA). This EUA will remain in effect (meaning this test can be used) for the duration of the COVID-19 declaration under Section 564(b)(1) of the Act, 21 U.S.C. section 360bbb-3(b)(1), unless the authorization is terminated or revoked.     Resp Syncytial Virus by PCR NEGATIVE NEGATIVE Final    Comment: (NOTE) Fact Sheet for Patients: EntrepreneurPulse.com.au  Fact Sheet for  Healthcare Providers: IncredibleEmployment.be  This test is not yet approved or cleared by the [place] FDA and has been authorized for detection and/or diagnosis of SARS-CoV-2 by FDA under an Emergency Use Authorization (EUA). This EUA will remain in effect (meaning this test can be used) for the duration of the COVID-19 declaration under Section 564(b)(1) of the Act, 21 U.S.C. section 360bbb-3(b)(1), unless the authorization is terminated or revoked.  Performed at [facility], [address] [place], [place] [address]      Medications:    alfuzosin  10 mg Oral Q breakfast   carvedilol  12.5 mg Oral BID WC   cyanocobalamin  1,000 mcg Subcutaneous Daily   enoxaparin (LOVENOX) injection  40 mg Subcutaneous Q24H   feeding supplement (GLUCERNA SHAKE)  237 mL Oral BID BM   furosemide  40 mg Oral BID   hydrALAZINE  50 mg Oral Q8H   insulin aspart protamine- aspart  16 Units Subcutaneous Q supper   insulin aspart protamine- aspart  20 Units Subcutaneous Q breakfast   lidocaine-EPINEPHrine-tetracaine  3 mL Topical Once   silver nitrate applicators  2 Stick Topical Once   simvastatin  40 mg Oral Daily   sodium chloride flush  3 mL Intravenous Q12H   Continuous Infusions:    LOS: 2 days   Charlynne Cousins  [facility]  07/26/2022, 8:28 AM

## 2022-07-26 NOTE — Telephone Encounter (Signed)
Daughter informed and states that she'd like to start the [facility]. Order will be sent through parachute

## 2022-07-27 ENCOUNTER — Other Ambulatory Visit (HOSPITAL_BASED_OUTPATIENT_CLINIC_OR_DEPARTMENT_OTHER): Payer: Self-pay

## 2022-07-27 ENCOUNTER — Ambulatory Visit: Payer: Medicare HMO | Admitting: Endocrinology

## 2022-07-27 DIAGNOSIS — D509 Iron deficiency anemia, unspecified: Secondary | ICD-10-CM | POA: Diagnosis not present

## 2022-07-27 DIAGNOSIS — F0390 Unspecified dementia without behavioral disturbance: Secondary | ICD-10-CM | POA: Diagnosis not present

## 2022-07-27 DIAGNOSIS — S0181XA Laceration without foreign body of other part of head, initial encounter: Secondary | ICD-10-CM | POA: Diagnosis not present

## 2022-07-27 DIAGNOSIS — I160 Hypertensive urgency: Secondary | ICD-10-CM

## 2022-07-27 DIAGNOSIS — N1831 Chronic kidney disease, stage 3a: Secondary | ICD-10-CM | POA: Diagnosis not present

## 2022-07-27 DIAGNOSIS — I779 Disorder of arteries and arterioles, unspecified: Secondary | ICD-10-CM | POA: Diagnosis not present

## 2022-07-27 DIAGNOSIS — R2681 Unsteadiness on feet: Secondary | ICD-10-CM | POA: Diagnosis not present

## 2022-07-27 DIAGNOSIS — L03113 Cellulitis of right upper limb: Secondary | ICD-10-CM | POA: Diagnosis not present

## 2022-07-27 DIAGNOSIS — R531 Weakness: Secondary | ICD-10-CM | POA: Diagnosis not present

## 2022-07-27 DIAGNOSIS — R4181 Age-related cognitive decline: Secondary | ICD-10-CM | POA: Diagnosis not present

## 2022-07-27 DIAGNOSIS — I2510 Atherosclerotic heart disease of native coronary artery without angina pectoris: Secondary | ICD-10-CM | POA: Diagnosis not present

## 2022-07-27 DIAGNOSIS — R296 Repeated falls: Secondary | ICD-10-CM | POA: Diagnosis not present

## 2022-07-27 DIAGNOSIS — N183 Chronic kidney disease, stage 3 unspecified: Secondary | ICD-10-CM | POA: Diagnosis not present

## 2022-07-27 DIAGNOSIS — G63 Polyneuropathy in diseases classified elsewhere: Secondary | ICD-10-CM | POA: Diagnosis not present

## 2022-07-27 DIAGNOSIS — I25810 Atherosclerosis of coronary artery bypass graft(s) without angina pectoris: Secondary | ICD-10-CM | POA: Diagnosis not present

## 2022-07-27 DIAGNOSIS — I129 Hypertensive chronic kidney disease with stage 1 through stage 4 chronic kidney disease, or unspecified chronic kidney disease: Secondary | ICD-10-CM | POA: Diagnosis not present

## 2022-07-27 DIAGNOSIS — E1165 Type 2 diabetes mellitus with hyperglycemia: Secondary | ICD-10-CM | POA: Diagnosis not present

## 2022-07-27 DIAGNOSIS — M6281 Muscle weakness (generalized): Secondary | ICD-10-CM | POA: Diagnosis not present

## 2022-07-27 DIAGNOSIS — N400 Enlarged prostate without lower urinary tract symptoms: Secondary | ICD-10-CM | POA: Diagnosis not present

## 2022-07-27 DIAGNOSIS — R269 Unspecified abnormalities of gait and mobility: Secondary | ICD-10-CM | POA: Diagnosis not present

## 2022-07-27 DIAGNOSIS — E1159 Type 2 diabetes mellitus with other circulatory complications: Secondary | ICD-10-CM | POA: Diagnosis not present

## 2022-07-27 DIAGNOSIS — I2542 Coronary artery dissection: Secondary | ICD-10-CM | POA: Diagnosis not present

## 2022-07-27 DIAGNOSIS — S0191XA Laceration without foreign body of unspecified part of head, initial encounter: Secondary | ICD-10-CM | POA: Diagnosis not present

## 2022-07-27 DIAGNOSIS — Z7401 Bed confinement status: Secondary | ICD-10-CM | POA: Diagnosis not present

## 2022-07-27 DIAGNOSIS — E785 Hyperlipidemia, unspecified: Secondary | ICD-10-CM | POA: Diagnosis not present

## 2022-07-27 DIAGNOSIS — I5032 Chronic diastolic (congestive) heart failure: Secondary | ICD-10-CM | POA: Diagnosis not present

## 2022-07-27 DIAGNOSIS — E1142 Type 2 diabetes mellitus with diabetic polyneuropathy: Secondary | ICD-10-CM | POA: Diagnosis not present

## 2022-07-27 LAB — GLUCOSE, CAPILLARY
Glucose-Capillary: 192 mg/dL — ABNORMAL HIGH (ref 70–99)
Glucose-Capillary: 195 mg/dL — ABNORMAL HIGH (ref 70–99)

## 2022-07-27 MED ORDER — INSULIN ASPART PROT & ASPART (70-30 MIX) 100 UNIT/ML ~~LOC~~ SUSP
25.0000 [IU] | Freq: Every day | SUBCUTANEOUS | 11 refills | Status: DC
  Filled 2022-07-27: qty 10, 28d supply, fill #0

## 2022-07-27 MED ORDER — INSULIN ASPART PROT & ASPART (70-30 MIX) 100 UNIT/ML ~~LOC~~ SUSP: 16.0000 [IU] | Freq: Every day | SUBCUTANEOUS | 11 refills | Status: DC

## 2022-07-27 MED ORDER — FUROSEMIDE 40 MG PO TABS
40.0000 mg | ORAL_TABLET | Freq: Two times a day (BID) | ORAL | Status: DC
  Filled 2022-07-27: qty 1

## 2022-07-27 MED ORDER — CARVEDILOL 12.5 MG PO TABS: 12.5000 mg | ORAL_TABLET | Freq: Two times a day (BID) | ORAL | Status: DC

## 2022-07-27 NOTE — Care Management Important Message (Signed)
Important Message  Patient Details  Name: Justin Salinas MRN: [id number] Date of Birth: Apr 21, 1941   Medicare Important Message Given:  Yes     Shelda Altes 07/27/2022, 9:12 AM

## 2022-07-27 NOTE — Progress Notes (Addendum)
Physical Therapy Treatment Patient Details Name: Justin Salinas MRN: [id number] DOB: 10-08-1940 Today's Date: 07/27/2022   History of Present Illness Pt is 82 year old presented to [facility] on  07/23/22 after recurrent falls at home and concern for cognitive decline. Imaging studies negative for acute findings. PMH - DM, CAD, CABG, HTN    PT Comments    Patient making good progress with mobility and eager to mobilize this date. Pt requires supervision for bed mobility as he is slightly impulsive. Min guard/assist for sit<>stands and gait with RW. Pt required cues for technique to power up and safe hand placement; repeated verbal/tactile cues needed at EOS during repeat sit<>stands for motor learning. HR stable throughout gait and sit<>stands. EOS pt returned to supine and bed place in partial chair position. Continue to recommend SNF rehab, will progress as able.   Recommendations for follow up therapy are one component of a multi-disciplinary discharge planning process, led by the attending physician.  Recommendations may be updated based on patient status, additional functional criteria and insurance authorization.  Follow Up Recommendations  Skilled nursing-short term rehab (<3 hours/day) Can patient physically be transported by private vehicle: Yes   Assistance Recommended at Discharge Frequent or constant Supervision/Assistance  Patient can return home with the following Assistance with cooking/housework;Direct supervision/assist for financial management;Direct supervision/assist for medications management;Help with stairs or ramp for entrance;Assist for transportation   Equipment Recommendations  Rollator (4 wheels)    Recommendations for Other Services       Precautions / Restrictions Precautions Precautions: Fall Restrictions Weight Bearing Restrictions: No     Mobility  Bed Mobility Overal bed mobility: Needs Assistance Bed Mobility: Sit to Supine, Supine to Sit      Supine to sit: Supervision Sit to supine: Supervision   General bed mobility comments: HOB slightly elevated. pt using entire body to pivot and sit up EOB, supervision for safety. pt able to bring LE's on/off EOB.    Transfers Overall transfer level: Needs assistance Equipment used: Rolling walker (2 wheels) Transfers: Sit to/from Stand Sit to Stand: Min guard, Min assist           General transfer comment: intermittent guard/assist for power up from EOB. pt completed with cues required for hand placement. EOs pt completed repeat sit<>stand for practice with technique/sequence.    Ambulation/Gait Ambulation/Gait assistance: Min assist, Min guard Gait Distance (Feet): 250 Feet Assistive device: Rolling walker (2 wheels) Gait Pattern/deviations: Step-through pattern, Decreased stride length Gait velocity: decr     General Gait Details: min assist/guard for gait with RW, cues for position to RW at start and pt managed safely throughout gait. HR stable in 80's during ambulation and 70's at rest.   Stairs             Wheelchair Mobility    Modified Rankin (Stroke Patients Only)       Balance Overall balance assessment: Needs assistance Sitting-balance support: Feet supported Sitting balance-Leahy Scale: Good     Standing balance support: During functional activity, Reliant on assistive device for balance Standing balance-Leahy Scale: Poor  Pt able to don underwear in standing with mod assist to pull all the way up.                            Cognition Arousal/Alertness: Awake/alert Behavior During Therapy: Impulsive Overall Cognitive Status: No family/caregiver present to determine baseline cognitive functioning  Current Attention Level: Selective Memory: Decreased short-term memory Following Commands: Follows one step commands consistently, Follows multi-step commands inconsistently Safety/Judgement: Decreased  awareness of safety, Decreased awareness of deficits Awareness: Emergent Problem Solving: Requires verbal cues, Requires tactile cues, Difficulty sequencing, Slow processing General Comments: pt pleasant and happy to mobilize. pt continues to have decreased awareness of impairments and poor safety awareness.        Exercises      General Comments        Pertinent Vitals/Pain Pain Assessment Pain Assessment: No/denies pain Pain Intervention(s): Monitored during session    Home Living                          Prior Function            PT Goals (current goals can now be found in the care plan section) Acute Rehab PT Goals PT Goal Formulation: With patient Time For Goal Achievement: 08/07/22 Potential to Achieve Goals: Good Progress towards PT goals: Progressing toward goals    Frequency    Min 3X/week      PT Plan Current plan remains appropriate    Co-evaluation              AM-PAC PT "6 Clicks" Mobility   Outcome Measure  Help needed turning from your back to your side while in a flat bed without using bedrails?: A Little Help needed moving from lying on your back to sitting on the side of a flat bed without using bedrails?: A Little Help needed moving to and from a bed to a chair (including a wheelchair)?: A Little Help needed standing up from a chair using your arms (e.g., wheelchair or bedside chair)?: A Little Help needed to walk in hospital room?: A Little Help needed climbing 3-5 steps with a railing? : A Little 6 Click Score: 18    End of Session Equipment Utilized During Treatment: Gait belt Activity Tolerance: Patient tolerated treatment well Patient left: with call bell/phone within reach;in bed;with bed alarm set Nurse Communication: Mobility status PT Visit Diagnosis: Unsteadiness on feet (R26.81);Other abnormalities of gait and mobility (R26.89);Repeated falls (R29.6);Muscle weakness (generalized) (M62.81)     Time:  [id number] PT Time Calculation (min) (ACUTE ONLY): 17 min  Charges:  $Gait Training: 8-22 mins                     Verner Mould, DPT Acute Rehabilitation Services Office [phone]  07/27/22 2:13 PM

## 2022-07-27 NOTE — Progress Notes (Signed)
Patients daughter called and made aware of patients belongings that were left. It was explained that they were here at the nurses station on [address]. She verbalized understanding and stated she would be by to get the belongings. Vander Kueker, Bettina Gavia RN

## 2022-07-27 NOTE — Discharge Summary (Signed)
Physician Discharge Summary  Justin Salinas [id number] DOB: 12/02/1940 DOA: 07/23/2022  PCP: Vivi Barrack, MD  Admit date: 07/23/2022 Discharge date: 07/27/2022  Admitted From: Home Disposition:  SNF  Recommendations for Outpatient Follow-up:  Follow up with PCP in 1-2 weeks Please obtain BMP/CBC in one week   Home Health:No Equipment/Devices:None  Discharge Condition:Stable CODE STATUS:Full Diet recommendation: Heart Healthy   Brief/Interim Summary: 82 y.o. male past medical history significant for diabetes mellitus type 2, coronary artery disease, CAD status post CABG anemia hyperlipidemia chronic MCC 3 chronic diastolic heart failure presents after assisted fall at home, daughter relates she has been falling multiple times there is no memory loss no loss of consciousness during the fall.  The daughter relates there have been some decline in cognition   Discharge Diagnoses:  Principal Problem:   Recurrent falls Active Problems:   Dyslipidemia associated with type 2 diabetes mellitus ([place])   CAD (coronary artery disease)   Carotid artery disease (HCC)   BPH (benign prostatic hyperplasia)   S/P CABG x 4   Diastolic dysfunction   Hypertension associated with diabetes ([place])   CKD stage 3 due to type 2 diabetes mellitus (HCC)   Type 2 diabetes mellitus with hyperglycemia (HCC)   Iron deficiency anemia   Cognitive decline   Hyperglycemia  Recurrent falls with significant cognitive decline: Imaging showed no acute finding fracture or dislocation, he has no focal deficit on physical exam. Physical therapy evaluated the patient recommended skilled nursing facility. According to daughter he is not taking his insulin as he used to be very methodical and what he ate and very responsible with his insulin and he has been forgetful at times giving his insulin.  Dyslipidemia associated with type II diabetes mellitus type 2 With an A1c of 10.4 his 7030 was increased he will  continue CBGs before meals and at bedtime and continue higher dose of insulin at facility.  Essential hypertension: No changes made to his medication continue Coreg Lasix hydralazine and Norvasc.  Coronary artery disease: 2D echo was unremarkable except for chronic diastolic heart failure no changes made to his medication he denies any chest pain or shortness of breath.  Chronic diastolic heart failure: Continue Lasix beta-blocker he remained euvolemic.  Chronic kidney disease stage IIIa: His creatinine appears to be at baseline.  BPH: Continue Flomax. Discharge Instructions  Discharge Instructions     Diet - low sodium heart healthy   Complete by: As directed    Increase activity slowly   Complete by: As directed    No wound care   Complete by: As directed       Allergies as of 07/27/2022       Reactions   Exenatide Other (See Comments)   Linagliptin Other (See Comments)   Lasix [furosemide] Other (See Comments)   Constipation        Medication List     STOP taking these medications    NovoLOG Mix 70/30 FlexPen (70-30) 100 UNIT/ML FlexPen Generic drug: insulin aspart protamine - aspart Replaced by: insulin aspart protamine- aspart (70-30) 100 UNIT/ML injection       TAKE these medications    Accu-Chek Softclix Lancet Dev Kit   alfuzosin 10 MG 24 hr tablet Commonly known as: UROXATRAL Take 10 mg by mouth daily with breakfast.   amLODipine 10 MG tablet Commonly known as: NORVASC TAKE 1 TABLET EVERY DAY   aspirin 81 MG tablet Take 81 mg by mouth daily.   azelastine 0.1 %  nasal spray Commonly known as: ASTELIN Place 2 sprays into both nostrils 2 (two) times daily.   carvedilol 12.5 MG tablet Commonly known as: COREG Take 1 tablet (12.5 mg total) by mouth 2 (two) times daily with a meal. What changed:  medication strength how much to take   CENTRUM SILVER ADULT 50+ PO Take by mouth.   Droplet Pen Needles 31G X 5 MM Misc Generic drug:  Insulin Pen Needle USE AS DIRECTED SUBCUTANEOUSLY TWO TIMES DAILY   furosemide 40 MG tablet Commonly known as: LASIX Take 40 mg by mouth 2 (two) times daily.   Glucosamine 1500 Complex Caps Take by mouth.   glucose blood test strip Commonly known as: Accu-Chek Guide Use to check blood sugars 2 times daily   Accu-Chek Guide test strip Generic drug: glucose blood TEST BLOOD SUGAR TWICE DAILY   hydrALAZINE 50 MG tablet Commonly known as: APRESOLINE Take by mouth.   insulin aspart protamine- aspart (70-30) 100 UNIT/ML injection Commonly known as: NOVOLOG MIX 70/30 Inject 0.16 mLs (16 Units total) into the skin daily with supper. Replaces: NovoLOG Mix 70/30 FlexPen (70-30) 100 UNIT/ML FlexPen   insulin aspart protamine- aspart (70-30) 100 UNIT/ML injection Commonly known as: NOVOLOG MIX 70/30 Inject 0.25 mLs (25 Units total) into the skin daily with breakfast.   Omega-3 1400 MG Caps Take 1,400 mg by mouth daily.   pregabalin 25 MG capsule Commonly known as: Lyrica Take 1 capsule (25 mg total) by mouth 2 (two) times daily.   repaglinide 1 MG tablet Commonly known as: PRANDIN TAKE ONE TABLET BEFORE BREAKFAST AND LUNCH   simvastatin 40 MG tablet Commonly known as: ZOCOR Take 1 tablet (40 mg total) by mouth daily.   sodium chloride 0.65 % Soln nasal spray Commonly known as: OCEAN Place 1 spray into both nostrils as needed for congestion.   TRUEplus Lancets 28G Misc TEST BLOOD SUGAR TWICE DAILY   vitamin C 1000 MG tablet Take 1,000 mg by mouth daily.        Allergies  Allergen Reactions   Exenatide Other (See Comments)   Linagliptin Other (See Comments)   Lasix [Furosemide] Other (See Comments)    Constipation    Consultations: None   Procedures/Studies: ECHOCARDIOGRAM COMPLETE  Result Date: 07/24/2022    ECHOCARDIOGRAM REPORT   Patient Name:   Justin Salinas Date of Exam: 07/24/2022 Medical Rec #:  [id number]          Height:       69.0 in Accession  #:    [id number]         Weight:       155.0 lb Date of Birth:  01-Jul-1940           BSA:          1.854 m Patient Age:    82 years           BP:           156/69 mmHg Patient Gender: M                  HR:           79 bpm. Exam Location:  Inpatient Procedure: 2D Echo, Color Doppler and Cardiac Doppler Indications:    R94.31 Abnormal EKG  History:        Patient has prior history of Echocardiogram examinations, most                 recent 05/20/2014. CAD, Prior CABG;  Risk Factors:Hypertension,                 Diabetes and Dyslipidemia.  Sonographer:    Raquel Sarna Senior RDCS Referring Phys: [phone] [facility]  Sonographer Comments: Technically difficult due to rib/lung interference. IMPRESSIONS  1. Left ventricular ejection fraction, by estimation, is 60 to 65%. The left ventricle has normal function. Left ventricular endocardial border not optimally defined to evaluate regional wall motion. There is moderate asymmetric left ventricular hypertrophy of the septal segment. Left ventricular diastolic parameters are consistent with Grade I diastolic dysfunction (impaired relaxation).  2. Right ventricular systolic function is normal. The right ventricular size is normal. Tricuspid regurgitation signal is inadequate for assessing PA pressure.  3. The mitral valve was not well visualized. No evidence of mitral valve regurgitation. Moderate to severe mitral annular calcification.  4. The aortic valve was not well visualized. Aortic valve regurgitation is not visualized. Comparison(s): Prior images unable to be directly viewed, comparison made by report only. Tricuspid regurgitation has improved from prior reporting. FINDINGS  Left Ventricle: Left ventricular ejection fraction, by estimation, is 60 to 65%. The left ventricle has normal function. Left ventricular endocardial border not optimally defined to evaluate regional wall motion. The left ventricular internal cavity size was small. There is moderate asymmetric left  ventricular hypertrophy of the septal segment. Left ventricular diastolic parameters are consistent with Grade I diastolic dysfunction (impaired relaxation). Right Ventricle: The right ventricular size is normal. No increase in right ventricular wall thickness. Right ventricular systolic function is normal. Tricuspid regurgitation signal is inadequate for assessing PA pressure. Left Atrium: Left atrial size was normal in size. Right Atrium: Right atrial size was normal in size. Pericardium: There is no evidence of pericardial effusion. Presence of epicardial fat layer. Mitral Valve: The mitral valve was not well visualized. Moderate to severe mitral annular calcification. No evidence of mitral valve regurgitation. Tricuspid Valve: The tricuspid valve is normal in structure. Tricuspid valve regurgitation is not demonstrated. No evidence of tricuspid stenosis. Aortic Valve: The aortic valve was not well visualized. Aortic valve regurgitation is not visualized. Pulmonic Valve: The pulmonic valve was normal in structure. Pulmonic valve regurgitation is not visualized. No evidence of pulmonic stenosis. Aorta: The aortic root is normal in size and structure. IAS/Shunts: No atrial level shunt detected by color flow Doppler.  LEFT VENTRICLE PLAX 2D LVIDd:         3.20 cm   Diastology LVIDs:         1.80 cm   LV e' medial:    6.31 cm/s LV PW:         1.00 cm   LV E/e' medial:  12.4 LV IVS:        1.30 cm   LV e' lateral:   12.80 cm/s LVOT diam:     2.10 cm   LV E/e' lateral: 6.1 LV SV:         76 LV SV Index:   41 LVOT Area:     3.46 cm  RIGHT VENTRICLE RV S prime:     9.14 cm/s TAPSE (M-mode): 1.8 cm LEFT ATRIUM             Index        RIGHT ATRIUM           Index LA diam:        3.40 cm 1.83 cm/m   RA Area:     18.00 cm LA Vol (A2C):   49.8 ml 26.86 ml/m  RA Volume:   45.10 ml  24.33 ml/m LA Vol (A4C):   53.4 ml 28.81 ml/m LA Biplane Vol: 52.0 ml 28.05 ml/m  AORTIC VALVE LVOT Vmax:   110.00 cm/s LVOT Vmean:  83.400  cm/s LVOT VTI:    0.219 m  AORTA Ao Root diam: 3.30 cm MITRAL VALVE MV Area (PHT): 2.46 cm     SHUNTS MV Decel Time: 308 msec     Systemic VTI:  0.22 m MV E velocity: 78.10 cm/s   Systemic Diam: 2.10 cm MV A velocity: 109.00 cm/s MV E/A ratio:  0.72 Rudean Haskell MD Electronically signed by Rudean Haskell MD Signature Date/Time: 07/24/2022/3:45:01 PM    Final    CT Head Wo Contrast  Result Date: 07/23/2022 CLINICAL DATA:  Facial trauma EXAM: CT HEAD WITHOUT CONTRAST CT MAXILLOFACIAL WITHOUT CONTRAST CT CERVICAL SPINE WITHOUT CONTRAST TECHNIQUE: Multidetector CT imaging of the head, cervical spine, and maxillofacial structures were performed using the standard protocol without intravenous contrast. Multiplanar CT image reconstructions of the cervical spine and maxillofacial structures were also generated. RADIATION DOSE REDUCTION: This exam was performed according to the departmental dose-optimization program which includes automated exposure control, adjustment of the mA and/or kV according to patient size and/or use of iterative reconstruction technique. COMPARISON:  CT Head 07/16/22 FINDINGS: CT HEAD FINDINGS Brain: No evidence of acute infarction, hemorrhage, extra-axial collection or mass lesion/mass effect. Chronic left cerebellar infarct. Unchanged size and shape of the ventricular system. Vascular: No hyperdense vessel or unexpected calcification. Skull: Normal. Negative for fracture or focal lesion. Other: None CT MAXILLOFACIAL FINDINGS Osseous: No fracture or mandibular dislocation. No destructive process. Orbits: Bilateral lens replacement. Orbits are otherwise unremarkable. Sinuses: Near-complete opacification left maxillary sinus with osseous changes suggestive of chronic left maxillary sinusitis. Paranasal sinuses are otherwise clear. Soft tissues: Aeration along the midline frontal scalp. CT CERVICAL SPINE FINDINGS Alignment: Grade 1 anterolisthesis of C4 on C5 and C7 on T1. Skull base  and vertebrae: No acute fracture. No primary bone lesion or focal pathologic process. Soft tissues and spinal canal: No prevertebral fluid or swelling. No visible canal hematoma. Disc levels: There is likely at least moderate spinal canal stenosis at C4-C5 secondary to be large disc bulge. Upper chest: Negative. Other: None IMPRESSION: CT HEAD: 1. No acute intracranial abnormality. 2. Chronic left cerebellar infarct. CT MAXILLOFACIAL: 1. No acute facial bone fracture. 2. Chronic left maxillary sinusitis. CT CERVICAL SPINE: 1. No acute cervical spine fracture. 2. Likely at least moderate spinal canal stenosis at C4-C5 secondary to large disc bulge. Electronically Signed   By: Marin Roberts M.D.   On: 07/23/2022 10:47   CT Cervical Spine Wo Contrast  Result Date: 07/23/2022 CLINICAL DATA:  Facial trauma EXAM: CT HEAD WITHOUT CONTRAST CT MAXILLOFACIAL WITHOUT CONTRAST CT CERVICAL SPINE WITHOUT CONTRAST TECHNIQUE: Multidetector CT imaging of the head, cervical spine, and maxillofacial structures were performed using the standard protocol without intravenous contrast. Multiplanar CT image reconstructions of the cervical spine and maxillofacial structures were also generated. RADIATION DOSE REDUCTION: This exam was performed according to the departmental dose-optimization program which includes automated exposure control, adjustment of the mA and/or kV according to patient size and/or use of iterative reconstruction technique. COMPARISON:  CT Head 07/16/22 FINDINGS: CT HEAD FINDINGS Brain: No evidence of acute infarction, hemorrhage, extra-axial collection or mass lesion/mass effect. Chronic left cerebellar infarct. Unchanged size and shape of the ventricular system. Vascular: No hyperdense vessel or unexpected calcification. Skull: Normal. Negative for fracture or focal lesion. Other: None  CT MAXILLOFACIAL FINDINGS Osseous: No fracture or mandibular dislocation. No destructive process. Orbits: Bilateral lens  replacement. Orbits are otherwise unremarkable. Sinuses: Near-complete opacification left maxillary sinus with osseous changes suggestive of chronic left maxillary sinusitis. Paranasal sinuses are otherwise clear. Soft tissues: Aeration along the midline frontal scalp. CT CERVICAL SPINE FINDINGS Alignment: Grade 1 anterolisthesis of C4 on C5 and C7 on T1. Skull base and vertebrae: No acute fracture. No primary bone lesion or focal pathologic process. Soft tissues and spinal canal: No prevertebral fluid or swelling. No visible canal hematoma. Disc levels: There is likely at least moderate spinal canal stenosis at C4-C5 secondary to be large disc bulge. Upper chest: Negative. Other: None IMPRESSION: CT HEAD: 1. No acute intracranial abnormality. 2. Chronic left cerebellar infarct. CT MAXILLOFACIAL: 1. No acute facial bone fracture. 2. Chronic left maxillary sinusitis. CT CERVICAL SPINE: 1. No acute cervical spine fracture. 2. Likely at least moderate spinal canal stenosis at C4-C5 secondary to large disc bulge. Electronically Signed   By: Marin Roberts M.D.   On: 07/23/2022 10:47   CT Maxillofacial Wo Contrast  Result Date: 07/23/2022 CLINICAL DATA:  Facial trauma EXAM: CT HEAD WITHOUT CONTRAST CT MAXILLOFACIAL WITHOUT CONTRAST CT CERVICAL SPINE WITHOUT CONTRAST TECHNIQUE: Multidetector CT imaging of the head, cervical spine, and maxillofacial structures were performed using the standard protocol without intravenous contrast. Multiplanar CT image reconstructions of the cervical spine and maxillofacial structures were also generated. RADIATION DOSE REDUCTION: This exam was performed according to the departmental dose-optimization program which includes automated exposure control, adjustment of the mA and/or kV according to patient size and/or use of iterative reconstruction technique. COMPARISON:  CT Head 07/16/22 FINDINGS: CT HEAD FINDINGS Brain: No evidence of acute infarction, hemorrhage, extra-axial collection  or mass lesion/mass effect. Chronic left cerebellar infarct. Unchanged size and shape of the ventricular system. Vascular: No hyperdense vessel or unexpected calcification. Skull: Normal. Negative for fracture or focal lesion. Other: None CT MAXILLOFACIAL FINDINGS Osseous: No fracture or mandibular dislocation. No destructive process. Orbits: Bilateral lens replacement. Orbits are otherwise unremarkable. Sinuses: Near-complete opacification left maxillary sinus with osseous changes suggestive of chronic left maxillary sinusitis. Paranasal sinuses are otherwise clear. Soft tissues: Aeration along the midline frontal scalp. CT CERVICAL SPINE FINDINGS Alignment: Grade 1 anterolisthesis of C4 on C5 and C7 on T1. Skull base and vertebrae: No acute fracture. No primary bone lesion or focal pathologic process. Soft tissues and spinal canal: No prevertebral fluid or swelling. No visible canal hematoma. Disc levels: There is likely at least moderate spinal canal stenosis at C4-C5 secondary to be large disc bulge. Upper chest: Negative. Other: None IMPRESSION: CT HEAD: 1. No acute intracranial abnormality. 2. Chronic left cerebellar infarct. CT MAXILLOFACIAL: 1. No acute facial bone fracture. 2. Chronic left maxillary sinusitis. CT CERVICAL SPINE: 1. No acute cervical spine fracture. 2. Likely at least moderate spinal canal stenosis at C4-C5 secondary to large disc bulge. Electronically Signed   By: Marin Roberts M.D.   On: 07/23/2022 10:47   DG Wrist Complete Left  Result Date: 07/23/2022 CLINICAL DATA:  Fall with injury. Patient reports cannula microwave to the trash and tripped and fell left wrist pain. EXAM: LEFT WRIST - COMPLETE 3+ VIEW COMPARISON:  None available FINDINGS: Mildly decreased bone mineralization. There is mild calcific density just medial to the triquetrum on frontal view. No definitive adjacent cortical defect is seen, however recommend clinical correlation for possible tiny superficial fracture. Mild  thumb carpometacarpal joint space narrowing and peripheral degenerative spurring. Mild  vascular calcifications. IMPRESSION: There is age indeterminate mild calcific density just medial to the triquetrum on frontal view. No definitive adjacent cortical defect is seen, however recommend clinical correlation for possible tiny superficial fracture. Electronically Signed   By: Yvonne Kendall M.D.   On: 07/23/2022 10:00   MR BRAIN WO CONTRAST  Result Date: 07/16/2022 CLINICAL DATA:  Altered mental status after a fall. History of carotid stenosis. EXAM: MRI HEAD WITHOUT CONTRAST TECHNIQUE: Multiplanar, multiecho pulse sequences of the brain and surrounding structures were obtained without intravenous contrast. COMPARISON:  Same-day CT head FINDINGS: Brain: There is no acute intracranial hemorrhage, extra-axial fluid collection, or acute infarct. There is background parenchymal volume loss with prominence of the ventricular system and extra-axial CSF spaces. Patchy FLAIR signal abnormality in the supratentorial white matter likely reflects sequela of underlying chronic small-vessel ischemic change. There are small remote infarcts in the left cerebellar hemisphere and left temporal lobe. There is a single punctate chronic microhemorrhage in the right temporal lobe. The pituitary and suprasellar region are normal. There is no mass lesion. There is no mass effect or midline shift. Vascular: The major arterial flow voids are normal. There is T2 hyperintensity in the left transverse and sigmoid sinuses, likely reflecting slow flow. Skull and upper cervical spine: Normal marrow signal. Sinuses/Orbits: There is chronic left maxillary sinusitis as seen on same-day CT. Bilateral lens implants are in place. The globes and orbits are otherwise unremarkable. Other: None. IMPRESSION: 1. No acute intracranial pathology. 2. Background parenchymal volume loss and small remote infarcts in the left cerebellar hemisphere and left temporal  lobe. Electronically Signed   By: Valetta Mole M.D.   On: 07/16/2022 16:14   DG Chest Portable 1 View  Result Date: 07/16/2022 CLINICAL DATA:  Fall, altered mental status. EXAM: PORTABLE CHEST 1 VIEW COMPARISON:  01/31/2022. FINDINGS: Trachea is midline. Heart size stable. Thoracic aorta is calcified. Lungs are clear. No pleural fluid. Old left ninth rib fracture. IMPRESSION: No acute findings. Electronically Signed   By: Lorin Picket M.D.   On: 07/16/2022 12:45   CT Lumbar Spine Wo Contrast  Result Date: 07/16/2022 CLINICAL DATA:  Unwitnessed fall last week EXAM: CT LUMBAR SPINE WITHOUT CONTRAST TECHNIQUE: Multidetector CT imaging of the lumbar spine was performed without intravenous contrast administration. Multiplanar CT image reconstructions were also generated. RADIATION DOSE REDUCTION: This exam was performed according to the departmental dose-optimization program which includes automated exposure control, adjustment of the mA and/or kV according to patient size and/or use of iterative reconstruction technique. COMPARISON:  MR lumbar spine, 03/05/2020 FINDINGS: Segmentation: 5 lumbar type vertebrae. Alignment: Degenerative straightening of the normal lumbar lordosis. Vertebrae: No fracture or location. Paraspinal and other soft tissues: Unremarkable. Disc levels: Severe multilevel disc space height loss and osteophytosis throughout the lumbar spine, with preservation of the L4-L5 disc space and discectomy and posterior fusion of L5-S1 IMPRESSION: 1. No fracture or dislocation of the lumbar spine. 2. Severe multilevel disc space height loss and osteophytosis throughout the lumbar spine, with preservation of the L4-L5 disc space and discectomy and posterior fusion of L5-S1. Electronically Signed   By: Delanna Ahmadi M.D.   On: 07/16/2022 12:18   CT Head Wo Contrast  Result Date: 07/16/2022 CLINICAL DATA:  Unwitnessed fall last week EXAM: CT HEAD WITHOUT CONTRAST CT CERVICAL SPINE WITHOUT CONTRAST  TECHNIQUE: Multidetector CT imaging of the head and cervical spine was performed following the standard protocol without intravenous contrast. Multiplanar CT image reconstructions of the cervical spine were also  generated. RADIATION DOSE REDUCTION: This exam was performed according to the departmental dose-optimization program which includes automated exposure control, adjustment of the mA and/or kV according to patient size and/or use of iterative reconstruction technique. COMPARISON:  None Available. FINDINGS: CT HEAD FINDINGS Brain: No evidence of acute infarction, hemorrhage, hydrocephalus, extra-axial collection or mass lesion/mass effect. Periventricular and deep white matter hypodensity. Vascular: No hyperdense vessel or unexpected calcification. Skull: Normal. Negative for fracture or focal lesion. Sinuses/Orbits: No acute finding. Chronic bony sinus wall and mucosal thickening of the left maxillary sinus, partially imaged. Other: None. CT CERVICAL SPINE FINDINGS Alignment: Normal. Skull base and vertebrae: No acute fracture. No primary bone lesion or focal pathologic process. Soft tissues and spinal canal: No prevertebral fluid or swelling. No visible canal hematoma. Disc levels: Mild to moderate multilevel disc space height loss and osteophytosis, worst at C6-C7. Upper chest: Negative. Other: None. IMPRESSION: 1. No acute intracranial pathology. Small-vessel white matter disease. 2. No fracture or static subluxation of the cervical spine. 3. Mild to moderate multilevel cervical disc degenerative disease. 4. Chronic changes of left maxillary sinusitis. Electronically Signed   By: Delanna Ahmadi M.D.   On: 07/16/2022 12:10   CT CERVICAL SPINE WO CONTRAST  Result Date: 07/16/2022 CLINICAL DATA:  Unwitnessed fall last week EXAM: CT HEAD WITHOUT CONTRAST CT CERVICAL SPINE WITHOUT CONTRAST TECHNIQUE: Multidetector CT imaging of the head and cervical spine was performed following the standard protocol without  intravenous contrast. Multiplanar CT image reconstructions of the cervical spine were also generated. RADIATION DOSE REDUCTION: This exam was performed according to the departmental dose-optimization program which includes automated exposure control, adjustment of the mA and/or kV according to patient size and/or use of iterative reconstruction technique. COMPARISON:  None Available. FINDINGS: CT HEAD FINDINGS Brain: No evidence of acute infarction, hemorrhage, hydrocephalus, extra-axial collection or mass lesion/mass effect. Periventricular and deep white matter hypodensity. Vascular: No hyperdense vessel or unexpected calcification. Skull: Normal. Negative for fracture or focal lesion. Sinuses/Orbits: No acute finding. Chronic bony sinus wall and mucosal thickening of the left maxillary sinus, partially imaged. Other: None. CT CERVICAL SPINE FINDINGS Alignment: Normal. Skull base and vertebrae: No acute fracture. No primary bone lesion or focal pathologic process. Soft tissues and spinal canal: No prevertebral fluid or swelling. No visible canal hematoma. Disc levels: Mild to moderate multilevel disc space height loss and osteophytosis, worst at C6-C7. Upper chest: Negative. Other: None. IMPRESSION: 1. No acute intracranial pathology. Small-vessel white matter disease. 2. No fracture or static subluxation of the cervical spine. 3. Mild to moderate multilevel cervical disc degenerative disease. 4. Chronic changes of left maxillary sinusitis. Electronically Signed   By: Delanna Ahmadi M.D.   On: 07/16/2022 12:10   (Echo, Carotid, EGD, Colonoscopy, ERCP)    Subjective: No complaints  Discharge Exam: Vitals:   07/27/22 0317 07/27/22 0731  BP: 127/67 138/65  Pulse: 77 73  Resp: 16 20  Temp: 97.7 F (36.5 C) (!) 97.5 F (36.4 C)  SpO2: 95% 93%   Vitals:   07/27/22 0000 07/27/22 0100 07/27/22 0317 07/27/22 0731  BP: (!) 104/55  127/67 138/65  Pulse: 77 73 77 73  Resp: '15 13 16 20  '$ Temp:   97.7 F  (36.5 C) (!) 97.5 F (36.4 C)  TempSrc:   Oral Oral  SpO2: 92% 95% 95% 93%  Weight:      Height:        General: Pt is alert, awake, not in acute distress Cardiovascular: RRR, S1/S2 +,  no rubs, no gallops Respiratory: CTA bilaterally, no wheezing, no rhonchi Abdominal: Soft, NT, ND, bowel sounds + Extremities: no edema, no cyanosis    The results of significant diagnostics from this hospitalization (including imaging, microbiology, ancillary and laboratory) are listed below for reference.     Microbiology: No results found for this or any previous visit (from the past 240 hour(s)).   Labs: BNP (last 3 results) No results for input(s): "BNP" in the last 8760 hours. Basic Metabolic Panel: Recent Labs  Lab 07/23/22 0925 07/23/22 1002 07/23/22 2237 07/24/22 0408 07/25/22 0644  NA 130* 130* 131* 137 131*  K 4.7 4.5 4.4 4.0 4.4  CL 96*  --  98 101 93*  CO2 25  --  '25 26 28  '$ GLUCOSE 527*  --  442* 116* 351*  BUN 26*  --  19 22 36*  CREATININE 1.63*  --  1.79* 1.62* 1.71*  CALCIUM 9.3  --  9.0 9.3 9.2  MG  --   --   --   --  1.9   Liver Function Tests: Recent Labs  Lab 07/24/22 0408 07/25/22 0644  AST 19 18  ALT 17 15  ALKPHOS 75 76  BILITOT 0.7 0.8  PROT 6.3* 6.2*  ALBUMIN 3.3* 3.2*   No results for input(s): "LIPASE", "AMYLASE" in the last 168 hours. No results for input(s): "AMMONIA" in the last 168 hours. CBC: Recent Labs  Lab 07/23/22 0925 07/23/22 1002 07/24/22 0408 07/25/22 0644  WBC 7.5  --  8.0 6.7  NEUTROABS 5.1  --   --   --   HGB 12.1* 12.2* 12.7* 13.0  HCT 36.0* 36.0* 38.5* 38.2*  MCV 94.7  --  95.8 94.3  PLT 173  --  175 187   Cardiac Enzymes: No results for input(s): "CKTOTAL", "CKMB", "CKMBINDEX", "TROPONINI" in the last 168 hours. BNP: Invalid input(s): "POCBNP" CBG: Recent Labs  Lab 07/26/22 0632 07/26/22 1228 07/26/22 1606 07/26/22 2040 07/27/22 0617  GLUCAP 161* 310* 389* 99 192*   D-Dimer No results for input(s):  "DDIMER" in the last 72 hours. Hgb A1c No results for input(s): "HGBA1C" in the last 72 hours. Lipid Profile No results for input(s): "CHOL", "HDL", "LDLCALC", "TRIG", "CHOLHDL", "LDLDIRECT" in the last 72 hours. Thyroid function studies Recent Labs    07/25/22 0644  TSH 2.481   Anemia work up Recent Labs    07/25/22 0644  VITAMINB12 378  FOLATE 22.2  FERRITIN 116  TIBC 218*  IRON 71  RETICCTPCT 1.9   Urinalysis    Component Value Date/Time   COLORURINE YELLOW 07/23/2022 1000   APPEARANCEUR CLEAR 07/23/2022 1000   LABSPEC 1.016 07/23/2022 1000   PHURINE 5.5 07/23/2022 1000   GLUCOSEU >1,000 (A) 07/23/2022 1000   GLUCOSEU NEGATIVE 04/27/2019 0830   HGBUR TRACE (A) 07/23/2022 1000   BILIRUBINUR NEGATIVE 07/23/2022 1000   KETONESUR NEGATIVE 07/23/2022 1000   PROTEINUR NEGATIVE 07/23/2022 1000   UROBILINOGEN 0.2 04/27/2019 0830   NITRITE NEGATIVE 07/23/2022 1000   LEUKOCYTESUR NEGATIVE 07/23/2022 1000   Sepsis Labs Recent Labs  Lab 07/23/22 0925 07/24/22 0408 07/25/22 0644  WBC 7.5 8.0 6.7   Microbiology No results found for this or any previous visit (from the past 240 hour(s)).   SIGNED:   Charlynne Cousins, MD  [facility] 07/27/2022, 8:54 AM Pager   If 7PM-7AM, please contact night-coverage [email or web address] Password [email or web address]

## 2022-07-27 NOTE — TOC Transition Note (Signed)
Transition of Care [facility]) - CM/SW Discharge Note   Patient Details  Name: Justin Salinas MRN: [id number] Date of Birth: 11/25/1940  [facility] of [facility]) CM/SW Contact:  Vinie Sill, LCSW Phone Number: 07/27/2022, 10:30 AM   Clinical Narrative:     [facility] confirmed bed availability Received insurance authorization for SNF- reference # [id number] 2/23-2/27 CSW updated patient's daughter   Patient will Discharge to: [facility]  Discharge Date: 07/27/22 Family Notified: daughter Transport [id number] @ 2pm per SNF request  Per MD patient is ready for discharge. RN, patient, and facility notified of discharge. Discharge Summary sent to facility. RN given number for [phone], [phone]. Ambulance transport requested for patient.   Clinical Social Worker signing off.  Thurmond Butts, MSW, LCSW Clinical Social Worker     Final next level of care: Skilled Nursing Facility Barriers to Discharge: Barriers Resolved   Patient Goals and CMS Choice CMS Medicare.gov Compare Post Acute Care list provided to:: Patient Represenative (must comment) (Patient's daughter at bedside) Choice offered to / list presented to : Adult Children  Discharge Placement                Patient [facility] Patient to be transferred to facility by: [facility] Name of family member notified: daughter Patient and family notified of of transfer: 07/27/22  Discharge Plan and Services Additional resources added to the After Visit Summary for     Discharge Planning Services: CM Consult Post Acute Care Choice: [place]                               Social Determinants of Health (SDOH) Interventions SDOH Screenings   Food Insecurity: No Food Insecurity (07/23/2022)  Housing: Low Risk  (07/23/2022)  Transportation Needs: No Transportation Needs (07/23/2022)  Utilities: Not At Risk (07/23/2022)  Depression (PHQ2-9): Medium Risk  (02/06/2022)  Financial Resource Strain: Low Risk  (06/16/2021)  Physical Activity: Insufficiently Active (06/16/2021)  Social Connections: Moderately Integrated (06/16/2021)  Stress: No Stress Concern Present (06/16/2021)  Tobacco Use: High Risk (07/23/2022)     Readmission Risk Interventions     No data to display

## 2022-07-27 NOTE — Progress Notes (Signed)
Occupational Therapy Treatment Patient Details Name: Justin Salinas MRN: [id number] DOB: February 05, 1941 Today's Date: 07/27/2022   History of present illness Pt is 82 year old presented to [facility] on  07/23/22 after recurrent falls at home and concern for cognitive decline. Imaging studies negative for acute findings. PMH - DM, CAD, CABG, HTN   OT comments  Pt. Seen for skilled OT treatment session.  Pt. Able to complete bed mobility in/out with cues for sequencing and safety.  Lb dressing min guard a seated eob.  Cues for slowing pace as pt. Was slightly impulsive during session.  Agree with current d/c recommendations.     Recommendations for follow up therapy are one component of a multi-disciplinary discharge planning process, led by the attending physician.  Recommendations may be updated based on patient status, additional functional criteria and insurance authorization.    Follow Up Recommendations  Skilled nursing-short term rehab (<3 hours/day)     Assistance Recommended at Discharge Frequent or constant Supervision/Assistance  Patient can return home with the following  Help with stairs or ramp for entrance;Assistance with cooking/housework;A little help with walking and/or transfers;A little help with bathing/dressing/bathroom;Assist for transportation   Equipment Recommendations  BSC/3in1    Recommendations for Other Services      Precautions / Restrictions Precautions Precautions: Fall Restrictions Weight Bearing Restrictions: No       Mobility Bed Mobility Overal bed mobility: Needs Assistance Bed Mobility: Sit to Supine, Supine to Sit     Supine to sit: Supervision Sit to supine: Supervision   General bed mobility comments: hob flat, able to scoot and reposition in bed    Transfers                         Balance                                           ADL either performed or assessed with clinical judgement   ADL Overall  ADL's : Needs assistance/impaired                     Lower Body Dressing: Min guard;Sitting/lateral leans Lower Body Dressing Details (indicate cue type and reason): able to don/doff socks pulling/crossing legs over knees vs. bending forward                    Extremity/Trunk Assessment              Vision       Perception     Praxis      Cognition Arousal/Alertness: Awake/alert Behavior During Therapy: Impulsive Overall Cognitive Status: No family/caregiver present to determine baseline cognitive functioning                                          Exercises      Shoulder Instructions       General Comments  Pt. Has a chihuahua named bo bo that he adores!! Can not wait to get home to him     Pertinent Vitals/ Pain       Pain Assessment Pain Assessment: No/denies pain  Home Living  Prior Functioning/Environment              Frequency  Min 2X/week        Progress Toward Goals  OT Goals(current goals can now be found in the care plan section)  Progress towards OT goals: Progressing toward goals     Plan Discharge plan remains appropriate    Co-evaluation                 AM-PAC OT "6 Clicks" Daily Activity     Outcome Measure   Help from another person eating meals?: A Little Help from another person taking care of personal grooming?: A Little Help from another person toileting, which includes using toliet, bedpan, or urinal?: A Little Help from another person bathing (including washing, rinsing, drying)?: A Little Help from another person to put on and taking off regular upper body clothing?: A Little Help from another person to put on and taking off regular lower body clothing?: A Little 6 Click Score: 18    End of Session    OT Visit Diagnosis: Unsteadiness on feet (R26.81);Other abnormalities of gait and mobility (R26.89);Muscle weakness  (generalized) (M62.81);History of falling (Z91.81);Repeated falls (R29.6)   Activity Tolerance Patient tolerated treatment well   Patient Left in bed;with call bell/phone within reach;with bed alarm set   Nurse Communication Other (comment) (rn states ok to work with pt.)        Time: [id number] OT Time Calculation (min): 16 min  Charges: OT General Charges $OT Visit: 1 Visit OT Treatments $Self Care/Home Management : 8-22 mins  Sonia Baller, COTA/L Acute Rehabilitation [phone]   Clearnce Sorrel Lorraine-COTA/L 07/27/2022, 12:52 PM

## 2022-07-30 DIAGNOSIS — N400 Enlarged prostate without lower urinary tract symptoms: Secondary | ICD-10-CM | POA: Diagnosis not present

## 2022-07-30 DIAGNOSIS — G63 Polyneuropathy in diseases classified elsewhere: Secondary | ICD-10-CM | POA: Diagnosis not present

## 2022-07-30 DIAGNOSIS — I5032 Chronic diastolic (congestive) heart failure: Secondary | ICD-10-CM | POA: Diagnosis not present

## 2022-07-30 DIAGNOSIS — I25810 Atherosclerosis of coronary artery bypass graft(s) without angina pectoris: Secondary | ICD-10-CM | POA: Diagnosis not present

## 2022-07-30 DIAGNOSIS — E1165 Type 2 diabetes mellitus with hyperglycemia: Secondary | ICD-10-CM | POA: Diagnosis not present

## 2022-07-30 DIAGNOSIS — N1831 Chronic kidney disease, stage 3a: Secondary | ICD-10-CM | POA: Diagnosis not present

## 2022-07-30 DIAGNOSIS — E785 Hyperlipidemia, unspecified: Secondary | ICD-10-CM | POA: Diagnosis not present

## 2022-07-30 DIAGNOSIS — D509 Iron deficiency anemia, unspecified: Secondary | ICD-10-CM | POA: Diagnosis not present

## 2022-07-30 DIAGNOSIS — I129 Hypertensive chronic kidney disease with stage 1 through stage 4 chronic kidney disease, or unspecified chronic kidney disease: Secondary | ICD-10-CM | POA: Diagnosis not present

## 2022-07-31 DIAGNOSIS — E1142 Type 2 diabetes mellitus with diabetic polyneuropathy: Secondary | ICD-10-CM | POA: Diagnosis not present

## 2022-07-31 DIAGNOSIS — E785 Hyperlipidemia, unspecified: Secondary | ICD-10-CM | POA: Diagnosis not present

## 2022-07-31 DIAGNOSIS — N1831 Chronic kidney disease, stage 3a: Secondary | ICD-10-CM | POA: Diagnosis not present

## 2022-07-31 DIAGNOSIS — D509 Iron deficiency anemia, unspecified: Secondary | ICD-10-CM | POA: Diagnosis not present

## 2022-07-31 DIAGNOSIS — I129 Hypertensive chronic kidney disease with stage 1 through stage 4 chronic kidney disease, or unspecified chronic kidney disease: Secondary | ICD-10-CM | POA: Diagnosis not present

## 2022-07-31 DIAGNOSIS — L03113 Cellulitis of right upper limb: Secondary | ICD-10-CM | POA: Diagnosis not present

## 2022-08-03 DIAGNOSIS — R269 Unspecified abnormalities of gait and mobility: Secondary | ICD-10-CM | POA: Diagnosis not present

## 2022-08-03 DIAGNOSIS — I2510 Atherosclerotic heart disease of native coronary artery without angina pectoris: Secondary | ICD-10-CM | POA: Diagnosis not present

## 2022-08-03 DIAGNOSIS — F0390 Unspecified dementia without behavioral disturbance: Secondary | ICD-10-CM | POA: Diagnosis not present

## 2022-08-04 ENCOUNTER — Other Ambulatory Visit: Payer: Self-pay | Admitting: Endocrinology

## 2022-08-06 ENCOUNTER — Other Ambulatory Visit (HOSPITAL_BASED_OUTPATIENT_CLINIC_OR_DEPARTMENT_OTHER): Payer: Self-pay

## 2022-08-08 DIAGNOSIS — N189 Chronic kidney disease, unspecified: Secondary | ICD-10-CM | POA: Diagnosis not present

## 2022-08-08 DIAGNOSIS — I5032 Chronic diastolic (congestive) heart failure: Secondary | ICD-10-CM | POA: Diagnosis not present

## 2022-08-08 DIAGNOSIS — N400 Enlarged prostate without lower urinary tract symptoms: Secondary | ICD-10-CM | POA: Diagnosis not present

## 2022-08-08 DIAGNOSIS — G629 Polyneuropathy, unspecified: Secondary | ICD-10-CM | POA: Diagnosis not present

## 2022-08-08 DIAGNOSIS — E785 Hyperlipidemia, unspecified: Secondary | ICD-10-CM | POA: Diagnosis not present

## 2022-08-08 DIAGNOSIS — I10 Essential (primary) hypertension: Secondary | ICD-10-CM | POA: Diagnosis not present

## 2022-08-08 DIAGNOSIS — I2510 Atherosclerotic heart disease of native coronary artery without angina pectoris: Secondary | ICD-10-CM | POA: Diagnosis not present

## 2022-08-08 DIAGNOSIS — D649 Anemia, unspecified: Secondary | ICD-10-CM | POA: Diagnosis not present

## 2022-08-08 DIAGNOSIS — E119 Type 2 diabetes mellitus without complications: Secondary | ICD-10-CM | POA: Diagnosis not present

## 2022-08-09 DIAGNOSIS — I10 Essential (primary) hypertension: Secondary | ICD-10-CM | POA: Diagnosis not present

## 2022-08-09 DIAGNOSIS — E119 Type 2 diabetes mellitus without complications: Secondary | ICD-10-CM | POA: Diagnosis not present

## 2022-08-09 DIAGNOSIS — E559 Vitamin D deficiency, unspecified: Secondary | ICD-10-CM | POA: Diagnosis not present

## 2022-08-14 DIAGNOSIS — I10 Essential (primary) hypertension: Secondary | ICD-10-CM | POA: Diagnosis not present

## 2022-08-14 DIAGNOSIS — E1165 Type 2 diabetes mellitus with hyperglycemia: Secondary | ICD-10-CM | POA: Diagnosis not present

## 2022-08-14 DIAGNOSIS — E119 Type 2 diabetes mellitus without complications: Secondary | ICD-10-CM | POA: Diagnosis not present

## 2022-08-14 DIAGNOSIS — I2510 Atherosclerotic heart disease of native coronary artery without angina pectoris: Secondary | ICD-10-CM | POA: Diagnosis not present

## 2022-08-14 DIAGNOSIS — D649 Anemia, unspecified: Secondary | ICD-10-CM | POA: Diagnosis not present

## 2022-08-14 DIAGNOSIS — R5381 Other malaise: Secondary | ICD-10-CM | POA: Diagnosis not present

## 2022-08-14 DIAGNOSIS — I5032 Chronic diastolic (congestive) heart failure: Secondary | ICD-10-CM | POA: Diagnosis not present

## 2022-08-14 DIAGNOSIS — N189 Chronic kidney disease, unspecified: Secondary | ICD-10-CM | POA: Diagnosis not present

## 2022-08-14 DIAGNOSIS — N400 Enlarged prostate without lower urinary tract symptoms: Secondary | ICD-10-CM | POA: Diagnosis not present

## 2022-08-14 DIAGNOSIS — G629 Polyneuropathy, unspecified: Secondary | ICD-10-CM | POA: Diagnosis not present

## 2022-08-15 DIAGNOSIS — Z961 Presence of intraocular lens: Secondary | ICD-10-CM | POA: Diagnosis not present

## 2022-08-15 DIAGNOSIS — H52203 Unspecified astigmatism, bilateral: Secondary | ICD-10-CM | POA: Diagnosis not present

## 2022-08-15 DIAGNOSIS — E1165 Type 2 diabetes mellitus with hyperglycemia: Secondary | ICD-10-CM | POA: Diagnosis not present

## 2022-08-15 DIAGNOSIS — Z794 Long term (current) use of insulin: Secondary | ICD-10-CM | POA: Diagnosis not present

## 2022-08-17 ENCOUNTER — Telehealth: Payer: Self-pay | Admitting: Pharmacist

## 2022-08-17 NOTE — Progress Notes (Signed)
[place] Team  Reason for Encounter: Appointment Reminder  Contacted patient to confirm telephone appointment with Leata Mouse, PharmD on 08/20/2022 at 12 pm. Unsuccessful outreach. Left voicemail with appointment details.   Star Rating Drugs:  Lantus Solostar last filled 08/10/2022 28 DS Humalog 100u/mL last filled 08/10/2022 8 DS Repaglinide 1 mg last filled3/10/2022 30 DS Simvastatin 20 mg last filled 08/08/2022 30 DS   Care Gaps: Annual wellness visit in last year? No  If Diabetic: Last eye exam / retinopathy screening: 05/31/2021 Last diabetic foot exam: 02/28/2021  Future Appointments  Date Time Provider [place]  08/20/2022 12:00 PM Edythe Clarity, [place] None  09/11/2022 10:45 AM Alric Ran, MD [facility] None    April D Calhoun, [place] Pharmacist Assistant [phone]

## 2022-08-20 ENCOUNTER — Encounter: Payer: Medicare HMO | Admitting: Pharmacist

## 2022-08-20 DIAGNOSIS — N400 Enlarged prostate without lower urinary tract symptoms: Secondary | ICD-10-CM | POA: Diagnosis not present

## 2022-08-20 DIAGNOSIS — I10 Essential (primary) hypertension: Secondary | ICD-10-CM | POA: Diagnosis not present

## 2022-08-20 DIAGNOSIS — N189 Chronic kidney disease, unspecified: Secondary | ICD-10-CM | POA: Diagnosis not present

## 2022-08-20 DIAGNOSIS — I5032 Chronic diastolic (congestive) heart failure: Secondary | ICD-10-CM | POA: Diagnosis not present

## 2022-08-20 DIAGNOSIS — G629 Polyneuropathy, unspecified: Secondary | ICD-10-CM | POA: Diagnosis not present

## 2022-08-20 DIAGNOSIS — E119 Type 2 diabetes mellitus without complications: Secondary | ICD-10-CM | POA: Diagnosis not present

## 2022-08-20 DIAGNOSIS — E785 Hyperlipidemia, unspecified: Secondary | ICD-10-CM | POA: Diagnosis not present

## 2022-08-20 DIAGNOSIS — D649 Anemia, unspecified: Secondary | ICD-10-CM | POA: Diagnosis not present

## 2022-08-20 DIAGNOSIS — I2510 Atherosclerotic heart disease of native coronary artery without angina pectoris: Secondary | ICD-10-CM | POA: Diagnosis not present

## 2022-08-20 NOTE — Progress Notes (Unsigned)
Care Management & Coordination Services Pharmacy Note  08/20/2022 Name:  Justin Salinas MRN:  [id number] DOB:  [birth year]  Summary: ***  Recommendations/Changes made from today's visit: ***  Follow up plan: ***   Subjective: Justin Salinas is an 82 y.o. year old male who is a primary patient of Justin Salinas, [name], MD.  The care coordination team was consulted for assistance with disease management and care coordination needs.    Engaged with patient by telephone for follow up visit.  Recent office visits:  07/27/2021 OV (PCP) Vivi Barrack, MD; We will give 80 mg of Depo-Medrol today.  We will also start low-dose Lyrica.  He will follow-up with me in 1 to 2 weeks.  We can adjust the dose of Lyrica as needed for Lumbar radiculopathy,  If GFR is above 30 may consider low-dose of Toradol injection as needed.   02/27/2021 Mertie Clause, PA; no medication changes indicated.   02/22/2021 Mertie Clause, PA; no medication changes indicated.     Recent consult visits:  06/21/2021 OP Visit (Audiology) Olen Cordial, AuD; No medication changes indicated.   06/06/2021 OV (Endocrinology) Candiss Norse; he needs to follow instructions given Needs to make sure he takes his Prandin before starting to eat, He will start taking 6 units of insulin at lunchtime and also reduce his morning dose to 6 units He will not increase his evening dose even if blood sugars are high.   05/04/2021 OV (Otolaryngology) Velora Mediate, PA-C; No medication changes indicated.   04/24/2021 OV (Otolaryngology) Velora Mediate, PA-C; Finish 10 day course of Keflex   04/20/2021 OV (Otolaryngology) Velora Mediate, PA-C; We will start Keflex 500 BID for 10 days (he has CKD 3) and will get a stat temporal bone CT today or tomorrow to try to assess for abscess. I will have him follow up with me on Monday for recheck.   02/28/2021 OV (Endocrinology) Elayne Snare, MD; Considering  he has low normal readings at times in the morning we will reduce suppertime dose to 6 units  Also unless he is not eating any carbohydrates he will take Prandin at dinnertime also since he likely has high readings after eating , Since he likely will have difficulty with more complicated regimens he will continue premixed insulin twice a day for now. Likely needs 80 mg of Lasix for edema but he will confirm with his nephrologist.     Hospital visits:  07/23/2022 - Admission -    Objective:  Lab Results  Component Value Date   CREATININE 1.71 (H) 07/25/2022   BUN 36 (H) 07/25/2022   GFR 40.00 (L) 09/19/2021   EGFR 41 07/20/2021   GFRNONAA 39 (L) 07/25/2022   GFRAA 46 (L) 06/19/2018   NA 131 (L) 07/25/2022   K 4.4 07/25/2022   CALCIUM 9.2 07/25/2022   CO2 28 07/25/2022   GLUCOSE 351 (H) 07/25/2022    Lab Results  Component Value Date/Time   HGBA1C 10.7 (H) 07/23/2022 05:25 PM   HGBA1C 6.7 (A) 11/08/2021 09:44 AM   HGBA1C 7.0 (A) 06/06/2021 09:29 AM   HGBA1C 5.9 (H) 09/23/2020 05:51 AM   HGBA1C 9.7 12/03/2016 12:00 AM   FRUCTOSAMINE 340 (H) 06/21/2020 01:58 PM   FRUCTOSAMINE 340 (H) 02/23/2020 09:00 AM   GFR 40.00 (L) 09/19/2021 08:26 AM   GFR 28.57 (L) 06/21/2020 01:58 PM   MICROALBUR <0.7 09/19/2021 08:26 AM   MICROALBUR <0.7 04/27/2019 08:30 AM    Last diabetic  Eye exam:  Lab Results  Component Value Date/Time   HMDIABEYEEXA No Retinopathy 05/31/2021 12:00 AM    Last diabetic Foot exam: No results found for: "HMDIABFOOTEX"   Lab Results  Component Value Date   CHOL 149 09/19/2021   HDL 74.70 09/19/2021   LDLCALC 62 09/19/2021   LDLDIRECT 45.0 04/17/2017   TRIG 61.0 09/19/2021   CHOLHDL 2 09/19/2021       Latest Ref Rng & Units 07/25/2022    6:44 AM 07/24/2022    4:08 AM 07/16/2022   11:42 AM  Hepatic Function  Total Protein 6.5 - 8.1 g/dL 6.2  6.3  6.8   Albumin 3.5 - 5.0 g/dL 3.2  3.3  3.8   AST 15 - 41 U/L 18  19  15    ALT 0 - 44 U/L 15  17  14    Alk  Phosphatase 38 - 126 U/L 76  75  84   Total Bilirubin 0.3 - 1.2 mg/dL 0.8  0.7  0.5     Lab Results  Component Value Date/Time   TSH 2.481 07/25/2022 06:44 AM   TSH 2.79 09/19/2021 08:26 AM   TSH 1.55 10/16/2017 08:44 AM       Latest Ref Rng & Units 07/25/2022    6:44 AM 07/24/2022    4:08 AM 07/23/2022   10:02 AM  CBC  WBC 4.0 - 10.5 K/uL 6.7  8.0    Hemoglobin 13.0 - 17.0 g/dL 13.0  12.7  12.2   Hematocrit 39.0 - 52.0 % 38.2  38.5  36.0   Platelets 150 - 400 K/uL 187  175      Lab Results  Component Value Date/Time   VD25OH 45.05 10/22/2017 08:22 AM   VITAMINB12 378 07/25/2022 06:44 AM   VITAMINB12 262 10/22/2017 08:22 AM    Clinical ASCVD: {YES/NO:21197} The ASCVD Risk score (Arnett DK, et al., 2019) failed to calculate for the following reasons:   The 2019 ASCVD risk score is only valid for ages 13 to 64    ***Other: (CHADS2VASc if Afib, MMRC or CAT for COPD, ACT, DEXA)     02/06/2022   11:11 AM 01/03/2022    2:54 PM 06/16/2021    8:49 AM  Depression screen PHQ 2/9  Decreased Interest 1 0 0  Down, Depressed, Hopeless 2 1 1   PHQ - 2 Score 3 1 1   Altered sleeping 0    Tired, decreased energy 2    PHQ-9 Score 5       Social History   Tobacco Use  Smoking Status Former   Types: Cigarettes   Quit date: 06/04/1972   Years since quitting: 50.2  Smokeless Tobacco Current   Types: Chew  Tobacco Comments   quit about 40 years   BP Readings from Last 3 Encounters:  07/27/22 133/64  07/16/22 (!) 191/85  02/06/22 135/62   Pulse Readings from Last 3 Encounters:  07/27/22 66  07/16/22 (!) 58  02/06/22 62   Wt Readings from Last 3 Encounters:  07/25/22 157 lb 3 oz (71.3 kg)  07/16/22 154 lb 15.7 oz (70.3 kg)  02/06/22 155 lb (70.3 kg)   BMI Readings from Last 3 Encounters:  07/25/22 23.21 kg/m  07/16/22 22.89 kg/m  02/06/22 22.89 kg/m    Allergies  Allergen Reactions   Exenatide Other (See Comments)   Linagliptin Other (See Comments)   Lasix  [Furosemide] Other (See Comments)    Constipation    Medications Reviewed Today  Reviewed by Rollene Fare, RN (Registered Nurse) on 07/23/22 at 1749  Med List Status: <None>   Medication Order Taking? Sig Documenting Provider Last Dose Status Informant  ACCU-CHEK GUIDE test strip [id number] Yes TEST BLOOD SUGAR TWICE DAILY Elayne Snare, MD 07/22/2022 Active   alfuzosin (UROXATRAL) 10 MG 24 hr tablet [id number] Yes Take 10 mg by mouth daily with breakfast. [provider] 07/22/2022 Active Self  amLODipine (NORVASC) 10 MG tablet [id number] Yes TAKE 1 TABLET EVERY Lacie Scotts, MD 07/22/2022 Active   Ascorbic Acid (VITAMIN C) 1000 MG tablet [id number] No Take 1,000 mg by mouth daily. [provider] Unknown Active Self  aspirin 81 MG tablet [id number] Yes Take 81 mg by mouth daily. [provider] 07/22/2022 Active Self           Med Note Dina Rich, Darden Dates   Fri Sep 16, 2020 12:14 PM) On hold  azelastine (ASTELIN) 0.1 % nasal spray [id number] No Place 2 sprays into both nostrils 2 (two) times daily. Vivi Barrack, MD Unknown Active   carvedilol (COREG) 6.25 MG tablet [id number] Yes Take 6.25 mg by mouth 2 (two) times daily with a meal. [provider] 07/22/2022 Active   furosemide (LASIX) 40 MG tablet [id number] Yes Take 40 mg by mouth 2 (two) times daily. [provider] 07/22/2022 Active Self  Glucosamine-Chondroit-Vit C-Mn (GLUCOSAMINE 1500 COMPLEX) CAPS [id number] No Take by mouth. [provider] Unknown Active   glucose blood (ACCU-CHEK GUIDE) test strip [id number] Yes Use to check blood sugars 2 times daily Elayne Snare, MD 07/22/2022 Active Self  hydrALAZINE (APRESOLINE) 50 MG tablet [id number] Yes Take by mouth. [provider] 07/22/2022 Active   Insulin Pen Needle (DROPLET PEN NEEDLES) 31G X 5 MM MISC [id number] Yes USE AS DIRECTED SUBCUTANEOUSLY TWO TIMES DAILY Elayne Snare, MD 07/22/2022 Active   Lancets Misc. (ACCU-CHEK  SOFTCLIX LANCET DEV) KIT [id number] Yes  [provider] 07/22/2022 Active Self           Med Note Olena Heckle, MICHELE   Tue Feb 12, 2017  8:16 AM)    Multiple Vitamins-Minerals (CENTRUM SILVER ADULT 50+ PO) [id number] Yes Take by mouth. [provider] 07/22/2022 Active Self  NOVOLOG MIX 70/30 FLEXPEN (70-30) 100 UNIT/ML FlexPen [id number] Yes INJECT 8 UNITS UNDER THE SKIN BEFORE BREAKFAST AND 8 UNITS AT DINNER. DISCARD PEN [place] Kumar, Ajay, MD 07/22/2022 Active   Omega-3 1400 MG CAPS [id number] No Take 1,400 mg by mouth daily. [provider] Unknown Active Self  pregabalin (LYRICA) 25 MG capsule [id number] Yes Take 1 capsule (25 mg total) by mouth 2 (two) times daily. Vivi Barrack, MD 07/22/2022 Active   repaglinide (PRANDIN) 1 MG tablet [id number] Yes TAKE ONE TABLET BEFORE BREAKFAST AND LUNCH Elayne Snare, MD 07/22/2022 Active   simvastatin (ZOCOR) 40 MG tablet [id number] Yes Take 1 tablet (40 mg total) by mouth daily. [name], MD 07/22/2022 Active   sodium chloride (OCEAN) 0.65 % SOLN nasal spray [id number] Yes Place 1 spray into both nostrils as needed for congestion. [provider] 07/22/2022 Active   TRUEplus Lancets 28G MISC [id number] Yes TEST BLOOD SUGAR TWICE DAILY Elayne Snare, MD 07/22/2022 Active   Med List Note Tarry Kos, RN 11/06/17 1229):              SDOH:  (Social Determinants of Health) assessments and interventions performed: {yes/no:20286} SDOH Interventions    Flowsheet Row ED to Hosp-Admission (Discharged) from 07/23/2022 in [facility] 4E CV SURGICAL  PROGRESSIVE CARE Care Coordination from 02/08/2022 in [place] Patient Outreach Telephone from 01/03/2022 in [place] Interventions     Food Insecurity Interventions -- Intervention Not Indicated Intervention Not Indicated  Housing Interventions Patient Refused -- Intervention Not  Indicated  Transportation Interventions -- -- Intervention Not Indicated       Medication Assistance: {MEDASSISTANCEINFO:25044}  Medication Access: Within the past 30 days, how often has patient missed a dose of medication? *** Is a pillbox or other method used to improve adherence? {YES/NO:21197} Factors that may affect medication adherence? {CHL DESC; BARRIERS:21522} Are meds synced by current pharmacy? {YES/NO:21197} Are meds delivered by current pharmacy? {YES/NO:21197} Does patient experience delays in picking up medications due to transportation concerns? {YES/NO:21197}  [facility] Reviewed: Is patient disadvantaged to use [facility]?: {YES/NO:21197} Current Rx insurance plan: *** Name and location of Current pharmacy:  [id number] - [place], [place] [address] Phone: [phone] Fax: [phone]  [place], [place] [address] Phone: [phone] Fax: [phone]  [id number] [place], [place] - [address] [place] [address] Phone: [phone] Fax: [place] [address] Phone: [phone] Fax: [phone]  [facility] services reviewed with patient today?: {YES/NO:21197} Patient requests to transfer care to [facility]?: {YES/NO:21197} Reason patient declined to change pharmacies: {US patient preference:27474}  Compliance/Adherence/Medication fill history: Lantus Solostar last filled 08/10/2022 28 DS Humalog 100u/mL last filled 08/10/2022 8 DS Repaglinide 1 mg last filled3/10/2022 30 DS Simvastatin 20 mg last filled 08/08/2022 30 DS     Care Gaps: Annual wellness visit in last year? No   If Diabetic: Last eye exam / retinopathy  screening: 05/31/2021 Last diabetic foot exam: 02/28/2021   Assessment/Plan     Hypertension (BP goal <130/80) 02/12/22 -Controlled at last in office reading -Current treatment: Carvedilol 6.25mg  twice daily Appropriate, Query effective, Amlodipine 10mg  daily Appropriate, Effective, Safe, Accessible -Medications previously tried: chorthalidone, losartan, lisinopril, hydralazine -Current home readings: unsure, no logs -Denies hypotensive/hypertensive symptoms -Educated on BP goals and benefits of medications for prevention of heart attack, stroke and kidney damage; Daily salt intake goal < 2300 mg; Symptoms of hypotension and importance of maintaining adequate hydration; -Counseled to monitor BP at home daily, document, and provide log at future appointments -He admits to only taking amlodipine when needed previously.  Now reports he is taking daily.  Also is no longer taking hydralazine and reports his kidney doctor started him on carvedilol.  BP close to goal at last office visit.  Monitor for now.  Will have CMA call to check on meds he has at home/adherence.  Hyperlipidemia: (LDL goal < 70) -Controlled -Current treatment: Simvastatin 40mg  daily Appropriate, Effective, Safe, Accessible -Medications previously tried: none noted  -Current dietary patterns: see above -Current exercise habits: see above -Educated on Cholesterol goals;  Benefits of statin for ASCVD risk reduction; Importance of limiting foods high in cholesterol; -Recommended to continue current medication LDL at goal < 70 no changes needed  Diabetes (A1c goal <7%) 02/12/22 -Controlled, based on most recent A1c of 6.7% -Current medications: Repaglinide 1mg  BID Appropriate, Effective, Safe, Accessible Novolog 70/30 6 units before breakfast and 8 units at dinner Appropriate, Effective, Safe, Accessible -Medications previously tried: metformin,  Celesta Gentile  -Current home glucose readings fasting glucose: 135 this  morning, normally 100-130 post prandial glucose: not checking after meals -Denies hypoglycemic/hyperglycemic symptoms -Current exercise: active around the yard, goes to the gym 2-3 times per week -Educated on A1c and blood sugar goals; Prevention and management of hypoglycemic episodes; Benefits of routine self-monitoring of blood sugar; -Counseled to check feet daily and get yearly eye exams -Recent episode at the gym where he passed out and went to the emergency department with hypoglycemia.  Unclear why glucose dropped, he remembers eating oatmeal that morning.  After discussion may have been in a hurry and gave too much insulin.  Has not happened since.  Provided tips for accurate dosing.  Managed by endocrine, would not recommend any changes at this time due to one time occurrence.  He does keep emergency glucose source on hand.  Patient Goals/Self-Care Activities Patient will:  - take medications as prescribed as evidenced by patient report and record review check blood pressure daily, document, and provide at future appointments target a minimum of 150 minutes of moderate intensity exercise weekly engage in dietary modifications by limiting salt  Follow Up Plan: The care management team will reach out to the patient again over the next 180 days.           ***

## 2022-08-22 DIAGNOSIS — E119 Type 2 diabetes mellitus without complications: Secondary | ICD-10-CM | POA: Diagnosis not present

## 2022-08-22 DIAGNOSIS — I5032 Chronic diastolic (congestive) heart failure: Secondary | ICD-10-CM | POA: Diagnosis not present

## 2022-08-22 DIAGNOSIS — N400 Enlarged prostate without lower urinary tract symptoms: Secondary | ICD-10-CM | POA: Diagnosis not present

## 2022-08-22 DIAGNOSIS — D649 Anemia, unspecified: Secondary | ICD-10-CM | POA: Diagnosis not present

## 2022-08-22 DIAGNOSIS — G629 Polyneuropathy, unspecified: Secondary | ICD-10-CM | POA: Diagnosis not present

## 2022-08-22 DIAGNOSIS — E785 Hyperlipidemia, unspecified: Secondary | ICD-10-CM | POA: Diagnosis not present

## 2022-08-22 DIAGNOSIS — F419 Anxiety disorder, unspecified: Secondary | ICD-10-CM | POA: Diagnosis not present

## 2022-08-22 DIAGNOSIS — I10 Essential (primary) hypertension: Secondary | ICD-10-CM | POA: Diagnosis not present

## 2022-08-22 DIAGNOSIS — I2510 Atherosclerotic heart disease of native coronary artery without angina pectoris: Secondary | ICD-10-CM | POA: Diagnosis not present

## 2022-08-27 DIAGNOSIS — D649 Anemia, unspecified: Secondary | ICD-10-CM | POA: Diagnosis not present

## 2022-08-27 DIAGNOSIS — N400 Enlarged prostate without lower urinary tract symptoms: Secondary | ICD-10-CM | POA: Diagnosis not present

## 2022-08-27 DIAGNOSIS — I2510 Atherosclerotic heart disease of native coronary artery without angina pectoris: Secondary | ICD-10-CM | POA: Diagnosis not present

## 2022-08-27 DIAGNOSIS — I5032 Chronic diastolic (congestive) heart failure: Secondary | ICD-10-CM | POA: Diagnosis not present

## 2022-08-27 DIAGNOSIS — E785 Hyperlipidemia, unspecified: Secondary | ICD-10-CM | POA: Diagnosis not present

## 2022-08-27 DIAGNOSIS — N189 Chronic kidney disease, unspecified: Secondary | ICD-10-CM | POA: Diagnosis not present

## 2022-08-27 DIAGNOSIS — I10 Essential (primary) hypertension: Secondary | ICD-10-CM | POA: Diagnosis not present

## 2022-08-27 DIAGNOSIS — E119 Type 2 diabetes mellitus without complications: Secondary | ICD-10-CM | POA: Diagnosis not present

## 2022-08-27 DIAGNOSIS — G629 Polyneuropathy, unspecified: Secondary | ICD-10-CM | POA: Diagnosis not present

## 2022-08-28 DIAGNOSIS — Z794 Long term (current) use of insulin: Secondary | ICD-10-CM | POA: Diagnosis not present

## 2022-08-28 DIAGNOSIS — E1165 Type 2 diabetes mellitus with hyperglycemia: Secondary | ICD-10-CM | POA: Diagnosis not present

## 2022-08-29 DIAGNOSIS — I10 Essential (primary) hypertension: Secondary | ICD-10-CM | POA: Diagnosis not present

## 2022-08-29 DIAGNOSIS — D649 Anemia, unspecified: Secondary | ICD-10-CM | POA: Diagnosis not present

## 2022-08-29 DIAGNOSIS — N189 Chronic kidney disease, unspecified: Secondary | ICD-10-CM | POA: Diagnosis not present

## 2022-08-29 DIAGNOSIS — I2510 Atherosclerotic heart disease of native coronary artery without angina pectoris: Secondary | ICD-10-CM | POA: Diagnosis not present

## 2022-08-29 DIAGNOSIS — E119 Type 2 diabetes mellitus without complications: Secondary | ICD-10-CM | POA: Diagnosis not present

## 2022-08-29 DIAGNOSIS — R0602 Shortness of breath: Secondary | ICD-10-CM | POA: Diagnosis not present

## 2022-08-29 DIAGNOSIS — E785 Hyperlipidemia, unspecified: Secondary | ICD-10-CM | POA: Diagnosis not present

## 2022-08-29 DIAGNOSIS — J029 Acute pharyngitis, unspecified: Secondary | ICD-10-CM | POA: Diagnosis not present

## 2022-08-29 DIAGNOSIS — I5032 Chronic diastolic (congestive) heart failure: Secondary | ICD-10-CM | POA: Diagnosis not present

## 2022-08-30 DIAGNOSIS — N189 Chronic kidney disease, unspecified: Secondary | ICD-10-CM | POA: Diagnosis not present

## 2022-08-30 DIAGNOSIS — D649 Anemia, unspecified: Secondary | ICD-10-CM | POA: Diagnosis not present

## 2022-08-30 DIAGNOSIS — E785 Hyperlipidemia, unspecified: Secondary | ICD-10-CM | POA: Diagnosis not present

## 2022-08-30 DIAGNOSIS — F419 Anxiety disorder, unspecified: Secondary | ICD-10-CM | POA: Diagnosis not present

## 2022-08-30 DIAGNOSIS — I2510 Atherosclerotic heart disease of native coronary artery without angina pectoris: Secondary | ICD-10-CM | POA: Diagnosis not present

## 2022-08-30 DIAGNOSIS — E119 Type 2 diabetes mellitus without complications: Secondary | ICD-10-CM | POA: Diagnosis not present

## 2022-08-30 DIAGNOSIS — N400 Enlarged prostate without lower urinary tract symptoms: Secondary | ICD-10-CM | POA: Diagnosis not present

## 2022-08-30 DIAGNOSIS — I10 Essential (primary) hypertension: Secondary | ICD-10-CM | POA: Diagnosis not present

## 2022-08-30 DIAGNOSIS — I5032 Chronic diastolic (congestive) heart failure: Secondary | ICD-10-CM | POA: Diagnosis not present

## 2022-08-30 DIAGNOSIS — R051 Acute cough: Secondary | ICD-10-CM | POA: Diagnosis not present

## 2022-08-31 DIAGNOSIS — I10 Essential (primary) hypertension: Secondary | ICD-10-CM | POA: Diagnosis not present

## 2022-09-03 DIAGNOSIS — N189 Chronic kidney disease, unspecified: Secondary | ICD-10-CM | POA: Diagnosis not present

## 2022-09-03 DIAGNOSIS — I5032 Chronic diastolic (congestive) heart failure: Secondary | ICD-10-CM | POA: Diagnosis not present

## 2022-09-03 DIAGNOSIS — N183 Chronic kidney disease, stage 3 unspecified: Secondary | ICD-10-CM | POA: Diagnosis not present

## 2022-09-03 DIAGNOSIS — I2510 Atherosclerotic heart disease of native coronary artery without angina pectoris: Secondary | ICD-10-CM | POA: Diagnosis not present

## 2022-09-03 DIAGNOSIS — F419 Anxiety disorder, unspecified: Secondary | ICD-10-CM | POA: Diagnosis not present

## 2022-09-03 DIAGNOSIS — J111 Influenza due to unidentified influenza virus with other respiratory manifestations: Secondary | ICD-10-CM | POA: Diagnosis not present

## 2022-09-03 DIAGNOSIS — E119 Type 2 diabetes mellitus without complications: Secondary | ICD-10-CM | POA: Diagnosis not present

## 2022-09-03 DIAGNOSIS — S51811A Laceration without foreign body of right forearm, initial encounter: Secondary | ICD-10-CM | POA: Diagnosis not present

## 2022-09-03 DIAGNOSIS — I10 Essential (primary) hypertension: Secondary | ICD-10-CM | POA: Diagnosis not present

## 2022-09-03 DIAGNOSIS — E785 Hyperlipidemia, unspecified: Secondary | ICD-10-CM | POA: Diagnosis not present

## 2022-09-04 DIAGNOSIS — R062 Wheezing: Secondary | ICD-10-CM | POA: Diagnosis not present

## 2022-09-05 DIAGNOSIS — I2510 Atherosclerotic heart disease of native coronary artery without angina pectoris: Secondary | ICD-10-CM | POA: Diagnosis not present

## 2022-09-05 DIAGNOSIS — N189 Chronic kidney disease, unspecified: Secondary | ICD-10-CM | POA: Diagnosis not present

## 2022-09-05 DIAGNOSIS — E119 Type 2 diabetes mellitus without complications: Secondary | ICD-10-CM | POA: Diagnosis not present

## 2022-09-05 DIAGNOSIS — I10 Essential (primary) hypertension: Secondary | ICD-10-CM | POA: Diagnosis not present

## 2022-09-05 DIAGNOSIS — D649 Anemia, unspecified: Secondary | ICD-10-CM | POA: Diagnosis not present

## 2022-09-05 DIAGNOSIS — J111 Influenza due to unidentified influenza virus with other respiratory manifestations: Secondary | ICD-10-CM | POA: Diagnosis not present

## 2022-09-05 DIAGNOSIS — S51811A Laceration without foreign body of right forearm, initial encounter: Secondary | ICD-10-CM | POA: Diagnosis not present

## 2022-09-05 DIAGNOSIS — I5032 Chronic diastolic (congestive) heart failure: Secondary | ICD-10-CM | POA: Diagnosis not present

## 2022-09-05 DIAGNOSIS — E785 Hyperlipidemia, unspecified: Secondary | ICD-10-CM | POA: Diagnosis not present

## 2022-09-11 ENCOUNTER — Ambulatory Visit: Payer: Self-pay | Admitting: Neurology

## 2022-09-14 DIAGNOSIS — E785 Hyperlipidemia, unspecified: Secondary | ICD-10-CM | POA: Diagnosis not present

## 2022-09-14 DIAGNOSIS — D649 Anemia, unspecified: Secondary | ICD-10-CM | POA: Diagnosis not present

## 2022-09-14 DIAGNOSIS — I10 Essential (primary) hypertension: Secondary | ICD-10-CM | POA: Diagnosis not present

## 2022-09-14 DIAGNOSIS — F419 Anxiety disorder, unspecified: Secondary | ICD-10-CM | POA: Diagnosis not present

## 2022-09-14 DIAGNOSIS — I5032 Chronic diastolic (congestive) heart failure: Secondary | ICD-10-CM | POA: Diagnosis not present

## 2022-09-14 DIAGNOSIS — I2510 Atherosclerotic heart disease of native coronary artery without angina pectoris: Secondary | ICD-10-CM | POA: Diagnosis not present

## 2022-09-14 DIAGNOSIS — E119 Type 2 diabetes mellitus without complications: Secondary | ICD-10-CM | POA: Diagnosis not present

## 2022-09-14 DIAGNOSIS — N189 Chronic kidney disease, unspecified: Secondary | ICD-10-CM | POA: Diagnosis not present

## 2022-09-14 DIAGNOSIS — N400 Enlarged prostate without lower urinary tract symptoms: Secondary | ICD-10-CM | POA: Diagnosis not present

## 2022-09-19 DIAGNOSIS — E785 Hyperlipidemia, unspecified: Secondary | ICD-10-CM | POA: Diagnosis not present

## 2022-09-19 DIAGNOSIS — N189 Chronic kidney disease, unspecified: Secondary | ICD-10-CM | POA: Diagnosis not present

## 2022-09-19 DIAGNOSIS — D649 Anemia, unspecified: Secondary | ICD-10-CM | POA: Diagnosis not present

## 2022-09-19 DIAGNOSIS — E119 Type 2 diabetes mellitus without complications: Secondary | ICD-10-CM | POA: Diagnosis not present

## 2022-09-19 DIAGNOSIS — N400 Enlarged prostate without lower urinary tract symptoms: Secondary | ICD-10-CM | POA: Diagnosis not present

## 2022-09-19 DIAGNOSIS — I5032 Chronic diastolic (congestive) heart failure: Secondary | ICD-10-CM | POA: Diagnosis not present

## 2022-09-19 DIAGNOSIS — I10 Essential (primary) hypertension: Secondary | ICD-10-CM | POA: Diagnosis not present

## 2022-09-19 DIAGNOSIS — I2510 Atherosclerotic heart disease of native coronary artery without angina pectoris: Secondary | ICD-10-CM | POA: Diagnosis not present

## 2022-09-19 DIAGNOSIS — G629 Polyneuropathy, unspecified: Secondary | ICD-10-CM | POA: Diagnosis not present

## 2022-09-23 DIAGNOSIS — R2681 Unsteadiness on feet: Secondary | ICD-10-CM | POA: Diagnosis not present

## 2022-09-24 DIAGNOSIS — I10 Essential (primary) hypertension: Secondary | ICD-10-CM | POA: Diagnosis not present

## 2022-09-26 DIAGNOSIS — J189 Pneumonia, unspecified organism: Secondary | ICD-10-CM | POA: Diagnosis not present

## 2022-09-26 DIAGNOSIS — I10 Essential (primary) hypertension: Secondary | ICD-10-CM | POA: Diagnosis not present

## 2022-09-26 DIAGNOSIS — I5032 Chronic diastolic (congestive) heart failure: Secondary | ICD-10-CM | POA: Diagnosis not present

## 2022-09-26 DIAGNOSIS — D649 Anemia, unspecified: Secondary | ICD-10-CM | POA: Diagnosis not present

## 2022-09-26 DIAGNOSIS — N400 Enlarged prostate without lower urinary tract symptoms: Secondary | ICD-10-CM | POA: Diagnosis not present

## 2022-09-26 DIAGNOSIS — F419 Anxiety disorder, unspecified: Secondary | ICD-10-CM | POA: Diagnosis not present

## 2022-09-26 DIAGNOSIS — E119 Type 2 diabetes mellitus without complications: Secondary | ICD-10-CM | POA: Diagnosis not present

## 2022-09-26 DIAGNOSIS — I2510 Atherosclerotic heart disease of native coronary artery without angina pectoris: Secondary | ICD-10-CM | POA: Diagnosis not present

## 2022-09-26 DIAGNOSIS — E785 Hyperlipidemia, unspecified: Secondary | ICD-10-CM | POA: Diagnosis not present

## 2022-09-26 DIAGNOSIS — R2681 Unsteadiness on feet: Secondary | ICD-10-CM | POA: Diagnosis not present

## 2022-09-26 DIAGNOSIS — N189 Chronic kidney disease, unspecified: Secondary | ICD-10-CM | POA: Diagnosis not present

## 2022-10-02 DIAGNOSIS — E1159 Type 2 diabetes mellitus with other circulatory complications: Secondary | ICD-10-CM | POA: Diagnosis not present

## 2022-10-02 DIAGNOSIS — B351 Tinea unguium: Secondary | ICD-10-CM | POA: Diagnosis not present

## 2022-10-02 DIAGNOSIS — R2681 Unsteadiness on feet: Secondary | ICD-10-CM | POA: Diagnosis not present

## 2022-10-03 DIAGNOSIS — Z23 Encounter for immunization: Secondary | ICD-10-CM | POA: Diagnosis not present

## 2022-10-04 DIAGNOSIS — R2681 Unsteadiness on feet: Secondary | ICD-10-CM | POA: Diagnosis not present

## 2022-10-08 DIAGNOSIS — G629 Polyneuropathy, unspecified: Secondary | ICD-10-CM | POA: Diagnosis not present

## 2022-10-08 DIAGNOSIS — I2510 Atherosclerotic heart disease of native coronary artery without angina pectoris: Secondary | ICD-10-CM | POA: Diagnosis not present

## 2022-10-08 DIAGNOSIS — D649 Anemia, unspecified: Secondary | ICD-10-CM | POA: Diagnosis not present

## 2022-10-08 DIAGNOSIS — E119 Type 2 diabetes mellitus without complications: Secondary | ICD-10-CM | POA: Diagnosis not present

## 2022-10-08 DIAGNOSIS — I10 Essential (primary) hypertension: Secondary | ICD-10-CM | POA: Diagnosis not present

## 2022-10-08 DIAGNOSIS — I5032 Chronic diastolic (congestive) heart failure: Secondary | ICD-10-CM | POA: Diagnosis not present

## 2022-10-08 DIAGNOSIS — E785 Hyperlipidemia, unspecified: Secondary | ICD-10-CM | POA: Diagnosis not present

## 2022-10-08 DIAGNOSIS — N189 Chronic kidney disease, unspecified: Secondary | ICD-10-CM | POA: Diagnosis not present

## 2022-10-08 DIAGNOSIS — N400 Enlarged prostate without lower urinary tract symptoms: Secondary | ICD-10-CM | POA: Diagnosis not present

## 2022-10-10 DIAGNOSIS — E1100 Type 2 diabetes mellitus with hyperosmolarity without nonketotic hyperglycemic-hyperosmolar coma (NKHHC): Secondary | ICD-10-CM | POA: Diagnosis not present

## 2022-10-10 DIAGNOSIS — E119 Type 2 diabetes mellitus without complications: Secondary | ICD-10-CM | POA: Diagnosis not present

## 2022-10-10 DIAGNOSIS — R2681 Unsteadiness on feet: Secondary | ICD-10-CM | POA: Diagnosis not present

## 2022-10-15 DIAGNOSIS — E785 Hyperlipidemia, unspecified: Secondary | ICD-10-CM | POA: Diagnosis not present

## 2022-10-15 DIAGNOSIS — F419 Anxiety disorder, unspecified: Secondary | ICD-10-CM | POA: Diagnosis not present

## 2022-10-15 DIAGNOSIS — I10 Essential (primary) hypertension: Secondary | ICD-10-CM | POA: Diagnosis not present

## 2022-10-15 DIAGNOSIS — D649 Anemia, unspecified: Secondary | ICD-10-CM | POA: Diagnosis not present

## 2022-10-15 DIAGNOSIS — E119 Type 2 diabetes mellitus without complications: Secondary | ICD-10-CM | POA: Diagnosis not present

## 2022-10-15 DIAGNOSIS — E11649 Type 2 diabetes mellitus with hypoglycemia without coma: Secondary | ICD-10-CM | POA: Diagnosis not present

## 2022-10-15 DIAGNOSIS — I5032 Chronic diastolic (congestive) heart failure: Secondary | ICD-10-CM | POA: Diagnosis not present

## 2022-10-15 DIAGNOSIS — N400 Enlarged prostate without lower urinary tract symptoms: Secondary | ICD-10-CM | POA: Diagnosis not present

## 2022-10-15 DIAGNOSIS — N189 Chronic kidney disease, unspecified: Secondary | ICD-10-CM | POA: Diagnosis not present

## 2022-10-15 DIAGNOSIS — I2510 Atherosclerotic heart disease of native coronary artery without angina pectoris: Secondary | ICD-10-CM | POA: Diagnosis not present

## 2022-10-16 DIAGNOSIS — R2681 Unsteadiness on feet: Secondary | ICD-10-CM | POA: Diagnosis not present

## 2022-10-18 DIAGNOSIS — R2681 Unsteadiness on feet: Secondary | ICD-10-CM | POA: Diagnosis not present

## 2022-10-24 DIAGNOSIS — D649 Anemia, unspecified: Secondary | ICD-10-CM | POA: Diagnosis not present

## 2022-10-30 DIAGNOSIS — N183 Chronic kidney disease, stage 3 unspecified: Secondary | ICD-10-CM | POA: Diagnosis not present

## 2022-10-31 DIAGNOSIS — D649 Anemia, unspecified: Secondary | ICD-10-CM | POA: Diagnosis not present

## 2022-10-31 DIAGNOSIS — I2510 Atherosclerotic heart disease of native coronary artery without angina pectoris: Secondary | ICD-10-CM | POA: Diagnosis not present

## 2022-10-31 DIAGNOSIS — F32A Depression, unspecified: Secondary | ICD-10-CM | POA: Diagnosis not present

## 2022-10-31 DIAGNOSIS — I10 Essential (primary) hypertension: Secondary | ICD-10-CM | POA: Diagnosis not present

## 2022-10-31 DIAGNOSIS — E785 Hyperlipidemia, unspecified: Secondary | ICD-10-CM | POA: Diagnosis not present

## 2022-10-31 DIAGNOSIS — N189 Chronic kidney disease, unspecified: Secondary | ICD-10-CM | POA: Diagnosis not present

## 2022-10-31 DIAGNOSIS — E119 Type 2 diabetes mellitus without complications: Secondary | ICD-10-CM | POA: Diagnosis not present

## 2022-10-31 DIAGNOSIS — I5032 Chronic diastolic (congestive) heart failure: Secondary | ICD-10-CM | POA: Diagnosis not present

## 2022-10-31 DIAGNOSIS — N179 Acute kidney failure, unspecified: Secondary | ICD-10-CM | POA: Diagnosis not present

## 2022-11-01 DIAGNOSIS — I10 Essential (primary) hypertension: Secondary | ICD-10-CM | POA: Diagnosis not present

## 2022-11-14 ENCOUNTER — Telehealth: Payer: Self-pay | Admitting: Cardiovascular Disease

## 2022-11-14 NOTE — Telephone Encounter (Signed)
Received call transferred directly from operator and spoke with patient's daughter.  Daughter reports patient has been having issues with his BP and LE edema.  Has shortness of breath which started today.  He was seen by NP at his assisted living residence today for these issues.  Daughter reports medicine changes were made and recommendation was given that he see cardiologist.  Per March 2023 phone note patient no longer wanted to follow with cardiology.  Due to recommendation from NP today he would like to re establish with Dr Excell Seltzer.  Appointment made for patient to see Dr Excell Seltzer on June 20 at 11:00

## 2022-11-14 NOTE — Telephone Encounter (Signed)
Pt c/o Shortness Of Breath: STAT if SOB developed within the last 24 hours or pt is noticeably SOB on the phone  1. Are you currently SOB (can you hear that pt is SOB on the phone)? Yes   2. How long have you been experiencing SOB? For the last 24 hr   3. Are you SOB when sitting or when up moving around? Moving around   4. Are you currently experiencing any other symptoms? Lower extremity edema and elevated BP issues.

## 2022-11-22 ENCOUNTER — Ambulatory Visit: Payer: Medicare HMO | Attending: Cardiovascular Disease | Admitting: Cardiovascular Disease

## 2022-11-22 ENCOUNTER — Telehealth: Payer: Self-pay | Admitting: Cardiovascular Disease

## 2022-11-22 ENCOUNTER — Encounter: Payer: Self-pay | Admitting: Cardiovascular Disease

## 2022-11-22 VITALS — BP 156/72 | HR 59 | Ht 69.0 in | Wt 180.8 lb

## 2022-11-22 DIAGNOSIS — I10 Essential (primary) hypertension: Secondary | ICD-10-CM

## 2022-11-22 DIAGNOSIS — I2510 Atherosclerotic heart disease of native coronary artery without angina pectoris: Secondary | ICD-10-CM

## 2022-11-22 DIAGNOSIS — I5033 Acute on chronic diastolic (congestive) heart failure: Secondary | ICD-10-CM | POA: Diagnosis not present

## 2022-11-22 MED ORDER — EMPAGLIFLOZIN 10 MG PO TABS: 10.0000 mg | ORAL_TABLET | Freq: Every day | ORAL | 3 refills | Status: DC

## 2022-11-22 MED ORDER — EMPAGLIFLOZIN 10 MG PO TABS: 10.0000 mg | ORAL_TABLET | Freq: Every day | ORAL | 0 refills | Status: DC

## 2022-11-22 NOTE — Patient Instructions (Signed)
Medication Instructions:  Your physician has recommended you make the following change in your medication:   Start Jardiance 10 mg daily   *If you need a refill on your cardiac medications before your next appointment, please call your pharmacy*   Lab Work: BMET 2 weeks If you have labs (blood work) drawn today and your tests are completely normal, you will receive your results only by: MyChart Message (if you have MyChart) OR A paper copy in the mail If you have any lab test that is abnormal or we need to change your treatment, we will call you to review the results.   Follow-Up: At [facility], you and your health needs are our priority.  As part of our continuing mission to provide you with exceptional heart care, we have created designated Provider Care Teams.  These Care Teams include your primary Cardiologist (physician) and Advanced Practice Providers (APPs -  Physician Assistants and Nurse Practitioners) who all work together to provide you with the care you need, when you need it.   Your next appointment:   3 month(s)  Provider:   APP

## 2022-11-22 NOTE — Telephone Encounter (Signed)
Spoke to [place] with [employer], verified pt has returned to the facility.

## 2022-11-22 NOTE — Telephone Encounter (Signed)
Justin Salinas with [facility] called to confirm whether the patient's transportation picked him up after his appointment with Dr. Excell Seltzer at 11:00 AM. I contacted check-in/out staff secure chat and call but no response at this time. Please advise ASAP.

## 2022-11-22 NOTE — Progress Notes (Signed)
Cardiology Office Note:    Date:  11/24/2022   ID:  Justin Salinas, DOB [birth year], MRN [id number]  PCP:  Ardith Dark, MD   [facility] Providers Cardiologist:  Tonny Bollman, MD     Referring MD: Ardith Dark, MD   Chief Complaint  Patient presents with   Leg Swelling    History of Present Illness:    Justin Salinas is a 82 y.o. male presenting for evaluation of leg swelling. He has been followed here in the past with the following CV problem list:  Coronary artery disease  S/p PCI in 1990s S/p CABG in 12/15 Hypertension  Hyperlipidemia  Diabetes mellitus  Chronic kidney disease  Carotid artery dz  The patient is here alone today.  I have not seen him in over 3 years.  He is now living in a skilled nursing facility and he states "they take good care of me."  He was able to arrange transportation here through his health insurance.  His daughter called in this week about swelling in his legs and shortness of breath and was concerned about heart failure.  The patient states that he "gets tired" with physical activity.  He denies any chest pain or pressure.  He denies orthopnea or PND.  He wears compression socks and reports no significant change in his leg swelling which has been pretty chronic over time.  He was hospitalized after a fall in February of this year and I reviewed those records.  At that time he had marked hyperglycemia and was not taking his medications properly.  It seems like he is doing better now that he is in a nursing facility.  Past Medical History:  Diagnosis Date   Allergy    Anemia    Asthma    Atherosclerosis of abdominal aorta (HCC) 02/26/2020   Seen on x-ray L-spine September 2021   BPH (benign prostatic hyperplasia)    CAD (coronary artery disease)    a. s/p MI 09/1993;  b. known CTO of RCA;  c. 06/2014 Cath: LM short/mod dzs, LAD 95ost, LCX nl, RI nl, RCA 100, EF 55%;  c. 06/2014 s/p CABG x 4: LIMA->LAD, VG->D1, VG->OM1,  VG->Acute Marginal.   Carotid stenosis    a. Carotid US 1/17:  RICA 40-59%; LICA 60-79% // Carotid US 1/22: R 1-39; L 40-59; L VA aberrant flow; bilat subclavian stenosis // Carotid US 1/23: R 1-39, L 40-59; R subclavian stenosis   CKD (chronic kidney disease), stage III (HCC)    Constipation    in the past 2-3 weeks   Diabetes (HCC)    AODM   Diastolic dysfunction    a. 05/2015 Echo: EF 60-65%, LVH, Gr 1 DD, mild MR, mod dil RA/LA, mod TR, PASP .   Essential hypertension    GERD (gastroesophageal reflux disease)    History of kidney stones    Hyperlipidemia     Past Surgical History:  Procedure Laterality Date   ANGIOPLASTY  09/22/1993   POBA of RCA (Dr. Jonette Eva)   BACK SURGERY  2000   back fusion - Dr. Julio Sicks   CARDIAC CATHETERIZATION  11/28/1998   "silent occlusion" of RCA w/collaterals from left coronary system, prox LAD w/40-50% eccentric narrowing, 1st diagonal with 70-80% eccentric narrowing, 85% narrowing of prox small OM1 (Dr. Jonette Eva)   CAROTID DOPPLER  07/2011   left subclavian (50-69%); right bulb (0-49%); RICA (normal); left mid-distal CCA (0-49%); left bulb/prox ICA (50-69%); left vertebral with  abnormal antegrade flow   CORONARY ARTERY BYPASS GRAFT N/A 05/21/2014   Procedure: CORONARY ARTERY BYPASS GRAFTING (CABG) x  four, using left internal mammary artery and right leg greater saphenous vein harvested endoscopically;  Surgeon: Loreli Slot, MD;  Location: [facility] OR;  Service: Open Heart Surgery;  Laterality: N/A;   INTRAOPERATIVE TRANSESOPHAGEAL ECHOCARDIOGRAM N/A 05/21/2014   Procedure: INTRAOPERATIVE TRANSESOPHAGEAL ECHOCARDIOGRAM;  Surgeon: Loreli Slot, MD;  Location: [facility] OR;  Service: Open Heart Surgery;  Laterality: N/A;   LEFT HEART CATHETERIZATION WITH CORONARY ANGIOGRAM N/A 05/19/2014   Procedure: LEFT HEART CATHETERIZATION WITH CORONARY ANGIOGRAM;  Surgeon: Micheline Chapman, MD;  Location[facility] C[facility] LAB;  Service: Cardiovascular;   Laterality: N/A;   LUMBAR LAMINECTOMY/DECOMPRESSION MICRODISCECTOMY Left 09/23/2020   Procedure: Laminectomy and Foraminotomy - left - Lumbar three-Lumbar four;  Surgeon: Julio Sicks, MD;  Location: [facility] OR;  Service: Neurosurgery;  Laterality: Left;   NASAL SINUS SURGERY  2010   NM MYOCAR PERF WALL MOTION  09/19/2009   bruce myoview - mild perfusion defect in basal inferior region (infarct/scar), EF 60%, low risk scan   RENAL DOPPLER  10/2011   SMA w/ 70-99% diameter reduction & high grade stenosis; R&L renals w/narrowing and increased velocities (60-99%), R kidney smaller than L   TRANSTHORACIC ECHOCARDIOGRAM  10/20/2012   EF 55-60%, moderate concentric hypertrophy, ventricular septum thickness increased, calcified MV annulus   VASECTOMY  1963    Current Medications: Current Meds  Medication Sig   ACCU-CHEK GUIDE test strip TEST BLOOD SUGAR TWICE DAILY   alfuzosin (UROXATRAL) 10 MG 24 hr tablet Take 10 mg by mouth daily with breakfast.   amLODipine (NORVASC) 10 MG tablet TAKE 1 TABLET EVERY DAY   Ascorbic Acid (VITAMIN C) 1000 MG tablet Take 1,000 mg by mouth daily.   aspirin 81 MG tablet Take 81 mg by mouth daily.   azelastine (ASTELIN) 0.1 % nasal spray Place 2 sprays into both nostrils 2 (two) times daily.   carvedilol (COREG) 12.5 MG tablet Take 1 tablet (12.5 mg total) by mouth 2 (two) times daily with a meal.   cloNIDine (CATAPRES) 0.1 MG tablet Take 0.1 mg by mouth daily.   empagliflozin (JARDIANCE) 10 MG TABS tablet Take 1 tablet (10 mg total) by mouth daily before breakfast.   empagliflozin (JARDIANCE) 10 MG TABS tablet Take 1 tablet (10 mg total) by mouth daily before breakfast.   ferrous sulfate 325 (65 FE) MG EC tablet Take 325 mg by mouth daily.   furosemide (LASIX) 40 MG tablet Take 20 mg by mouth 2 (two) times daily.   Glucosamine-Chondroit-Vit C-Mn (GLUCOSAMINE 1500 COMPLEX) CAPS Take by mouth.   glucose blood (ACCU-CHEK GUIDE) test strip Use to check blood sugars 2 times  daily   HUMALOG KWIKPEN 100 UNIT/ML KwikPen Inject into the skin. On sliding scale   hydrALAZINE (APRESOLINE) 50 MG tablet Take by mouth.   Insulin Pen Needle (DROPLET PEN NEEDLES) 31G X 5 MM MISC USE AS DIRECTED SUBCUTANEOUSLY TWO TIMES DAILY   Lancets Misc. (ACCU-CHEK SOFTCLIX LANCET DEV) KIT    lisinopril (ZESTRIL) 20 MG tablet Take 20 mg by mouth daily.   Multiple Vitamins-Minerals (CENTRUM SILVER ADULT 50+ PO) Take by mouth.   Omega-3 1400 MG CAPS Take 1,400 mg by mouth daily.   pregabalin (LYRICA) 25 MG capsule Take 1 capsule (25 mg total) by mouth 2 (two) times daily.   repaglinide (PRANDIN) 1 MG tablet TAKE 1 TABLET BEFORE BREAKFAST AND TAKE 1 TABLET BEFORE LUNCH  sertraline (ZOLOFT) 50 MG tablet Take 50 mg by mouth daily.   simvastatin (ZOCOR) 40 MG tablet Take 1 tablet (40 mg total) by mouth daily.   sodium chloride (OCEAN) 0.65 % SOLN nasal spray Place 1 spray into both nostrils as needed for congestion.   TRUEplus Lancets 28G MISC TEST BLOOD SUGAR TWICE DAILY     Allergies:   Exenatide, Linagliptin, and Lasix [furosemide]   Social History   Socioeconomic History   Marital status: Divorced    Spouse name: Not on file   Number of children: 2   Years of education: Not on file   Highest education level: Not on file  Occupational History   Occupation: Retired   Tobacco Use   Smoking status: Former    Types: Cigarettes    Quit date: 06/04/1972    Years since quitting: 50.5   Smokeless tobacco: Current    Types: Chew   Tobacco comments:    quit about 40 years  Vaping Use   Vaping Use: Never used  Substance and Sexual Activity   Alcohol use: Yes    Alcohol/week: 3.0 standard drinks of alcohol    Types: 3 Standard drinks or equivalent per week    Comment: a glass of red wine 3 or 4 days a week.   Drug use: No   Sexual activity: Not on file  Other Topics Concern   Not on file  Social History Narrative   Recently got puppy May 2020   Long-term girlfriend of 17 years  died 05/29/16; complications from diabetes   Daughter lives nearby and assists as needed    Social Determinants of Health   Financial Resource Strain: Low Risk  (06/16/2021)   Overall Financial Resource Strain (CARDIA)    Difficulty of Paying Living Expenses: Not hard at all  Food Insecurity: No Food Insecurity (07/23/2022)   Hunger Vital Sign    Worried About Running Out of Food in the Last Year: Never true    Ran Out of Food in the Last Year: Never true  Transportation Needs: No Transportation Needs (07/23/2022)   PRAPARE - Administrator, Civil Service (Medical): No    Lack of Transportation (Non-Medical): No  Physical Activity: Insufficiently Active (06/16/2021)   Exercise Vital Sign    Days of Exercise per Week: 2 days    Minutes of Exercise per Session: 60 min  Stress: No Stress Concern Present (06/16/2021)   [employer] of Occupational Health - Occupational Stress Questionnaire    Feeling of Stress : Not at all  Social Connections: Moderately Integrated (06/16/2021)   Social Connection and Isolation Panel [NHANES]    Frequency of Communication with Friends and Family: Twice a week    Frequency of Social Gatherings with Friends and Family: Twice a week    Attends Religious Services: More than 4 times per year    Active Member of [employer] or Organizations: Yes    Attends Banker Meetings: 1 to 4 times per year    Marital Status: Widowed     Family History: The patient's family history includes Colon polyps in his brother; Diabetes in his sister; Heart disease in his brother; Stroke in his father; Uterine cancer in his mother. There is no history of Colon cancer, Esophageal cancer, Rectal cancer, or Stomach cancer.  ROS:   Please see the history of present illness.    All other systems reviewed and are negative.  EKGs/Labs/Other Studies Reviewed:    The following  studies were reviewed today: Cardiac Studies & Procedures     STRESS TESTS  NM  MYOCAR MULTI W/SPECT W 06/22/2014   ECHOCARDIOGRAM  ECHOCARDIOGRAM COMPLETE 07/24/2022  Narrative ECHOCARDIOGRAM REPORT    Patient Name:   REFUJIO HAYMER Date of Exam: 07/24/2022 Medical Rec #:  [id number]          Height:       69.0 in Accession #:    [id number]         Weight:       155.0 lb Date of Birth:  [birth year]           BSA:          1.854 m Patient Age:    81 years           BP:           156/69 mmHg Patient Gender: M                  HR:           79 bpm. Exam Location:  Inpatient  Procedure: 2D Echo, Color Doppler and Cardiac Doppler  Indications:    R94.31 Abnormal EKG  History:        Patient has prior history of Echocardiogram examinations, most recent 05/20/2014. CAD, Prior CABG; Risk Factors:Hypertension, Diabetes and Dyslipidemia.  Sonographer:    Irving Burton Senior RDCS Referring Phys: [phone] [facility]   Sonographer Comments: Technically difficult due to rib/lung interference. IMPRESSIONS   1. Left ventricular ejection fraction, by estimation, is 60 to 65%. The left ventricle has normal function. Left ventricular endocardial border not optimally defined to evaluate regional wall motion. There is moderate asymmetric left ventricular hypertrophy of the septal segment. Left ventricular diastolic parameters are consistent with Grade I diastolic dysfunction (impaired relaxation). 2. Right ventricular systolic function is normal. The right ventricular size is normal. Tricuspid regurgitation signal is inadequate for assessing PA pressure. 3. The mitral valve was not well visualized. No evidence of mitral valve regurgitation. Moderate to severe mitral annular calcification. 4. The aortic valve was not well visualized. Aortic valve regurgitation is not visualized.  Comparison(s): Prior images unable to be directly viewed, comparison made by report only. Tricuspid regurgitation has improved from prior reporting.  FINDINGS Left Ventricle: Left ventricular ejection  fraction, by estimation, is 60 to 65%. The left ventricle has normal function. Left ventricular endocardial border not optimally defined to evaluate regional wall motion. The left ventricular internal cavity size was small. There is moderate asymmetric left ventricular hypertrophy of the septal segment. Left ventricular diastolic parameters are consistent with Grade I diastolic dysfunction (impaired relaxation).  Right Ventricle: The right ventricular size is normal. No increase in right ventricular wall thickness. Right ventricular systolic function is normal. Tricuspid regurgitation signal is inadequate for assessing PA pressure.  Left Atrium: Left atrial size was normal in size.  Right Atrium: Right atrial size was normal in size.  Pericardium: There is no evidence of pericardial effusion. Presence of epicardial fat layer.  Mitral Valve: The mitral valve was not well visualized. Moderate to severe mitral annular calcification. No evidence of mitral valve regurgitation.  Tricuspid Valve: The tricuspid valve is normal in structure. Tricuspid valve regurgitation is not demonstrated. No evidence of tricuspid stenosis.  Aortic Valve: The aortic valve was not well visualized. Aortic valve regurgitation is not visualized.  Pulmonic Valve: The pulmonic valve was normal in structure. Pulmonic valve regurgitation is not visualized. No evidence of pulmonic stenosis.  Aorta: The  aortic root is normal in size and structure.  IAS/Shunts: No atrial level shunt detected by color flow Doppler.   LEFT VENTRICLE PLAX 2D LVIDd:         3.20 cm   Diastology LVIDs:         1.80 cm   LV e' medial:    6.31 cm/s LV PW:         1.00 cm   LV E/e' medial:  12.4 LV IVS:        1.30 cm   LV e' lateral:   12.80 cm/s LVOT diam:     2.10 cm   LV E/e' lateral: 6.1 LV SV:         76 LV SV Index:   41 LVOT Area:     3.46 cm   RIGHT VENTRICLE RV S prime:     9.14 cm/s TAPSE (M-mode): 1.8 cm  LEFT ATRIUM              Index        RIGHT ATRIUM           Index LA diam:        3.40 cm 1.83 cm/m   RA Area:     18.00 cm LA Vol (A2C):   49.8 ml 26.86 ml/m  RA Volume:   45.10 ml  24.33 ml/m LA Vol (A4C):   53.4 ml 28.81 ml/m LA Biplane Vol: 52.0 ml 28.05 ml/m AORTIC VALVE LVOT Vmax:   110.00 cm/s LVOT Vmean:  83.400 cm/s LVOT VTI:    0.219 m  AORTA Ao Root diam: 3.30 cm  MITRAL VALVE MV Area (PHT): 2.46 cm     SHUNTS MV Decel Time: 308 msec     Systemic VTI:  0.22 m MV E velocity: 78.10 cm/s   Systemic Diam: 2.10 cm MV A velocity: 109.00 cm/s MV E/A ratio:  0.72  Riley Lam MD Electronically signed by Riley Lam MD Signature Date/Time: 07/24/2022/3:45:01 PM    Final                  Recent Labs: 07/25/2022: ALT 15; BUN 36; Creatinine, Ser 1.71; Hemoglobin 13.0; Magnesium 1.9; Platelets 187; Potassium 4.4; Sodium 131; TSH 2.481  Recent Lipid Panel    Component Value Date/Time   CHOL 149 09/19/2021 0826   TRIG 61.0 09/19/2021 0826   HDL 74.70 09/19/2021 0826   CHOLHDL 2 09/19/2021 0826   VLDL 12.2 09/19/2021 0826   LDLCALC 62 09/19/2021 0826   LDLDIRECT 45.0 04/17/2017 0914     Risk Assessment/Calculations:          Physical Exam:    VS:  BP (!) 156/72   Pulse (!) 59   Ht 5\' 9"  (1.753 m)   Wt 180 lb 12.8 oz (82 kg)   SpO2 97%   BMI 26.70 kg/m     Wt Readings from Last 3 Encounters:  11/22/22 180 lb 12.8 oz (82 kg)  07/25/22 157 lb 3 oz (71.3 kg)  07/16/22 154 lb 15.7 oz (70.3 kg)     GEN:  Well nourished, well developed elderly male in no acute distress HEENT: Normal NECK: JVP moderately elevated LYMPHATICS: No lymphadenopathy CARDIAC: RRR, no murmurs, rubs, gallops RESPIRATORY:  Clear to auscultation without rales, wheezing or rhonchi  ABDOMEN: Soft, non-tender, non-distended MUSCULOSKELETAL: 1+ bilateral pretibial edema; No deformity  SKIN: Warm and dry NEUROLOGIC:  Alert and oriented x 3 PSYCHIATRIC:  Normal affect    ASSESSMENT:    1.  Coronary artery disease involving native coronary artery of native heart without angina pectoris   2. Acute on chronic diastolic heart failure (HCC)   3. Essential hypertension    PLAN:    In order of problems listed above:  The patient is stable with no angina.  He is status post CABG.  He continues on aspirin for antiplatelet therapy as well as a statin drug.  Last LDL cholesterol was 62 mg/dL. The patient has some evidence of volume overload on exam with lower extremity edema and elevated JVP.  He is on furosemide 40 mg twice daily.  I am reluctant to increase his loop diuretic in the setting of his advanced age and chronic kidney disease.  Recommend addition of Jardiance 10 mg daily in the setting of diabetes and diastolic heart failure.  He will need close follow-up of labs and we will arrange a metabolic panel in 2 weeks.  Recommend APP follow-up in 3 months.  Otherwise continue current doses of carvedilol, furosemide, amlodipine, and lisinopril.  I reviewed his recent echocardiogram done when he was hospitalized. Jardiance added, otherwise continue current medical therapy.  I think we need to be careful not to overtreat his blood pressure in the context of his advanced age and tendency for falls.           Medication Adjustments/Labs and Tests Ordered: Current medicines are reviewed at length with the patient today.  Concerns regarding medicines are outlined above.  Orders Placed This Encounter  Procedures   Basic metabolic panel   Meds ordered this encounter  Medications   empagliflozin (JARDIANCE) 10 MG TABS tablet    Sig: Take 1 tablet (10 mg total) by mouth daily before breakfast.    Dispense:  28 tablet    Refill:  0    Order Specific Question:   Lot Number?    Answer:   [id number]    Order Specific Question:   Expiration Date?    Answer:   07/04/2024   empagliflozin (JARDIANCE) 10 MG TABS tablet    Sig: Take 1 tablet (10 mg total) by mouth daily  before breakfast.    Dispense:  90 tablet    Refill:  3    Patient Instructions  Medication Instructions:  Your physician has recommended you make the following change in your medication:   Start Jardiance 10 mg daily   *If you need a refill on your cardiac medications before your next appointment, please call your pharmacy*   Lab Work: BMET 2 weeks If you have labs (blood work) drawn today and your tests are completely normal, you will receive your results only by: MyChart Message (if you have MyChart) OR A paper copy in the mail If you have any lab test that is abnormal or we need to change your treatment, we will call you to review the results.   Follow-Up: At [facility], you and your health needs are our priority.  As part of our continuing mission to provide you with exceptional heart care, we have created designated Provider Care Teams.  These Care Teams include your primary Cardiologist (physician) and Advanced Practice Providers (APPs -  Physician Assistants and Nurse Practitioners) who all work together to provide you with the care you need, when you need it.   Your next appointment:   3 month(s)  Provider:   APP    Signed, Tonny Bollman, MD  11/24/2022 9:31 PM    [facility]

## 2022-11-23 ENCOUNTER — Telehealth: Payer: Self-pay | Admitting: Cardiovascular Disease

## 2022-11-23 DIAGNOSIS — I10 Essential (primary) hypertension: Secondary | ICD-10-CM

## 2022-11-23 DIAGNOSIS — Z79899 Other long term (current) drug therapy: Secondary | ICD-10-CM

## 2022-11-23 NOTE — Telephone Encounter (Signed)
Olegario Messier from [facility] was returning [place] call regarding approving pt to have labs done by them but now she stated they'd need the order faxed over to them at [phone]. Please advise

## 2022-11-23 NOTE — Telephone Encounter (Signed)
BMET faxed to [facility] per request.

## 2022-11-23 NOTE — Telephone Encounter (Signed)
Attempted to return call to Kathy @[facility], but no answer and no voicemail service. OV with Excell Seltzer yesterday shows BMET ordered to be done in two weeks (12/07/22). Tamala Ser (daughter on [place]) and advised her that this would be perfectly fine, we just need a copy of results. She states she will make sure they know that and will provide them with my name if they need further instruction.

## 2022-11-23 NOTE — Telephone Encounter (Signed)
Office called to see if they can just do pt's labs there instead of him waiting to come in and have them done on his scheduled date 12/07/2022. They'd like a callback to know if they can move forward. Please advise

## 2022-11-24 ENCOUNTER — Encounter: Payer: Self-pay | Admitting: Cardiovascular Disease

## 2022-11-28 LAB — BASIC METABOLIC PANEL
BUN: 26 — AB (ref 4–21)
CO2: 23 — AB (ref 13–22)
Chloride: 105 (ref 99–108)
Creatinine: 2 — AB (ref 0.6–1.3)
Glucose: 167
Potassium: 4.3 mEq/L (ref 3.5–5.1)
Sodium: 137 (ref 137–147)

## 2022-11-28 LAB — COMPREHENSIVE METABOLIC PANEL
Albumin: 4.3 (ref 3.5–5.0)
Calcium: 8.9 (ref 8.7–10.7)
eGFR: 33

## 2022-11-28 LAB — IRON,TIBC AND FERRITIN PANEL
%SAT: 26
Ferritin: 181
Iron: 57
TIBC: 220
UIBC: 163

## 2022-11-28 LAB — CBC AND DIFFERENTIAL: Hemoglobin: 10.7 — AB (ref 13.5–17.5)

## 2022-11-29 LAB — LAB REPORT - SCANNED: EGFR: 33

## 2022-12-07 ENCOUNTER — Ambulatory Visit: Payer: Medicare HMO

## 2022-12-07 LAB — BASIC METABOLIC PANEL: EGFR: 40

## 2022-12-11 ENCOUNTER — Ambulatory Visit (INDEPENDENT_AMBULATORY_CARE_PROVIDER_SITE_OTHER): Payer: Medicare HMO

## 2022-12-11 VITALS — BP 144/68 | HR 60 | Temp 98.1°F | Wt 174.6 lb

## 2022-12-11 DIAGNOSIS — Z0000 Encounter for general adult medical examination without abnormal findings: Secondary | ICD-10-CM | POA: Diagnosis not present

## 2022-12-11 NOTE — Patient Instructions (Signed)
[name] , Thank you for taking time to come for your Medicare Wellness Visit. I appreciate your ongoing commitment to your health goals. Please review the following plan we discussed and let me know if I can assist you in the future.   These are the goals we discussed:  Goals      DIET - EAT MORE FRUITS AND VEGETABLES     Patient Stated     None at this time     Patient Stated     Get stronger and healthier      Track and Manage My Blood Pressure-Hypertension     Timeframe:  Long-Range Goal Priority:  High Start Date:   08/08/21                          Expected End Date: 02/08/22                       Follow Up Date 11/08/21    - check blood pressure daily - choose a place to take my blood pressure (home, clinic or office, retail store)    Why is this important?   You won't feel high blood pressure, but it can still hurt your blood vessels.  High blood pressure can cause heart or kidney problems. It can also cause a stroke.  Making lifestyle changes like losing a little weight or eating less salt will help.  Checking your blood pressure at home and at different times of the day can help to control blood pressure.  If the doctor prescribes medicine remember to take it the way the doctor ordered.  [employer] Dials the o/ffice if you cannot afford the medicine or if there are questions about it.     Notes:         This is a list of the screening recommended for you and due dates:  Health Maintenance  Topic Date Due   Colon Cancer Screening  12/11/2019   COVID-19 Vaccine (5 - 2023-24 season) 02/02/2022   Complete foot exam   02/28/2022   Eye exam for diabetics  05/31/2022   Medicare Annual Wellness Visit  06/16/2022   Yearly kidney health urinalysis for diabetes  09/20/2022   Flu Shot  01/03/2023   Hemoglobin A1C  01/21/2023   Yearly kidney function blood test for diabetes  12/07/2023   DTaP/Tdap/Td vaccine (2 - Td or Tdap) 12/04/2026   Pneumonia Vaccine  Completed   Zoster  (Shingles) Vaccine  Completed   HPV Vaccine  Aged Out    Advanced directives: Please bring a copy of your health care power of attorney and living will to the office at your convenience.  Conditions/risks identified: none at this time   Next appointment: Follow up in one year for your annual wellness visit.   Preventive Care 57 Years and Older, Male  Preventive care refers to lifestyle choices and visits with your health care provider that can promote health and wellness. What does preventive care include? A yearly physical exam. This is also called an annual well check. Dental exams once or twice a year. Routine eye exams. Ask your health care provider how often you should have your eyes checked. Personal lifestyle choices, including: Daily care of your teeth and gums. Regular physical activity. Eating a healthy diet. Avoiding tobacco and drug use. Limiting alcohol use. Practicing safe sex. Taking low doses of aspirin every day. Taking vitamin and mineral supplements as recommended by  your health care provider. What happens during an annual well check? The services and screenings done by your health care provider during your annual well check will depend on your age, overall health, lifestyle risk factors, and family history of disease. Counseling  Your health care provider may ask you questions about your: Alcohol use. Tobacco use. Drug use. Emotional well-being. Home and relationship well-being. Sexual activity. Eating habits. History of falls. Memory and ability to understand (cognition). Work and work Astronomer. Screening  You may have the following tests or measurements: Height, weight, and BMI. Blood pressure. Lipid and cholesterol levels. These may be checked every 5 years, or more frequently if you are over 96 years old. Skin check. Lung cancer screening. You may have this screening every year starting at age 71 if you have a 30-pack-year history of smoking  and currently smoke or have quit within the past 15 years. Fecal occult blood test (FOBT) of the stool. You may have this test every year starting at age 6. Flexible sigmoidoscopy or colonoscopy. You may have a sigmoidoscopy every 5 years or a colonoscopy every 10 years starting at age 71. Prostate cancer screening. Recommendations will vary depending on your family history and other risks. Hepatitis C blood test. Hepatitis B blood test. Sexually transmitted disease (STD) testing. Diabetes screening. This is done by checking your blood sugar (glucose) after you have not eaten for a while (fasting). You may have this done every 1-3 years. Abdominal aortic aneurysm (AAA) screening. You may need this if you are a current or former smoker. Osteoporosis. You may be screened starting at age 89 if you are at high risk. Talk with your health care provider about your test results, treatment options, and if necessary, the need for more tests. Vaccines  Your health care provider may recommend certain vaccines, such as: Influenza vaccine. This is recommended every year. Tetanus, diphtheria, and acellular pertussis (Tdap, Td) vaccine. You may need a Td booster every 10 years. Zoster vaccine. You may need this after age 66. Pneumococcal 13-valent conjugate (PCV13) vaccine. One dose is recommended after age 77. Pneumococcal polysaccharide (PPSV23) vaccine. One dose is recommended after age 73. Talk to your health care provider about which screenings and vaccines you need and how often you need them. This information is not intended to replace advice given to you by your health care provider. Make sure you discuss any questions you have with your health care provider. Document Released: 06/17/2015 Document Revised: 02/08/2016 Document Reviewed: 03/22/2015 Elsevier Interactive Patient Education  2017 [employer].  Fall Prevention in the Home Falls can cause injuries. They can happen to people of all ages.  There are many things you can do to make your home safe and to help prevent falls. What can I do on the outside of my home? Regularly fix the edges of walkways and driveways and fix any cracks. Remove anything that might make you trip as you walk through a door, such as a raised step or threshold. Trim any bushes or trees on the path to your home. Use bright outdoor lighting. Clear any walking paths of anything that might make someone trip, such as rocks or tools. Regularly check to see if handrails are loose or broken. Make sure that both sides of any steps have handrails. Any raised decks and porches should have guardrails on the edges. Have any leaves, snow, or ice cleared regularly. Use sand or salt on walking paths during winter. Clean up any spills in your garage  right away. This includes oil or grease spills. What can I do in the bathroom? Use night lights. Install grab bars by the toilet and in the tub and shower. Do not use towel bars as grab bars. Use non-skid mats or decals in the tub or shower. If you need to sit down in the shower, use a plastic, non-slip stool. Keep the floor dry. Clean up any water that spills on the floor as soon as it happens. Remove soap buildup in the tub or shower regularly. Attach bath mats securely with double-sided non-slip rug tape. Do not have throw rugs and other things on the floor that can make you trip. What can I do in the bedroom? Use night lights. Make sure that you have a light by your bed that is easy to reach. Do not use any sheets or blankets that are too big for your bed. They should not hang down onto the floor. Have a firm chair that has side arms. You can use this for support while you get dressed. Do not have throw rugs and other things on the floor that can make you trip. What can I do in the kitchen? Clean up any spills right away. Avoid walking on wet floors. Keep items that you use a lot in easy-to-reach places. If you need  to reach something above you, use a strong step stool that has a grab bar. Keep electrical cords out of the way. Do not use floor polish or wax that makes floors slippery. If you must use wax, use non-skid floor wax. Do not have throw rugs and other things on the floor that can make you trip. What can I do with my stairs? Do not leave any items on the stairs. Make sure that there are handrails on both sides of the stairs and use them. Fix handrails that are broken or loose. Make sure that handrails are as long as the stairways. Check any carpeting to make sure that it is firmly attached to the stairs. Fix any carpet that is loose or worn. Avoid having throw rugs at the top or bottom of the stairs. If you do have throw rugs, attach them to the floor with carpet tape. Make sure that you have a light switch at the top of the stairs and the bottom of the stairs. If you do not have them, ask someone to add them for you. What else can I do to help prevent falls? Wear shoes that: Do not have high heels. Have rubber bottoms. Are comfortable and fit you well. Are closed at the toe. Do not wear sandals. If you use a stepladder: Make sure that it is fully opened. Do not climb a closed stepladder. Make sure that both sides of the stepladder are locked into place. Ask someone to hold it for you, if possible. Clearly mark and make sure that you can see: Any grab bars or handrails. First and last steps. Where the edge of each step is. Use tools that help you move around (mobility aids) if they are needed. These include: Canes. Walkers. Scooters. Crutches. Turn on the lights when you go into a dark area. Replace any light bulbs as soon as they burn out. Set up your furniture so you have a clear path. Avoid moving your furniture around. If any of your floors are uneven, fix them. If there are any pets around you, be aware of where they are. Review your medicines with your doctor. Some medicines can  make  you feel dizzy. This can increase your chance of falling. Ask your doctor what other things that you can do to help prevent falls. This information is not intended to replace advice given to you by your health care provider. Make sure you discuss any questions you have with your health care provider. Document Released: 03/17/2009 Document Revised: 10/27/2015 Document Reviewed: 06/25/2014 Elsevier Interactive Patient Education  2017 [employer].

## 2022-12-11 NOTE — Progress Notes (Signed)
Subjective:   Justin Salinas is a 82 y.o. male who presents for Medicare Annual/Subsequent preventive examination.  Visit Complete: In person  Review of Systems     Cardiac Risk Factors include: advanced age (>40men, >2 women);diabetes mellitus;dyslipidemia;hypertension;male gender     Objective:    Today's Vitals   12/11/22 0946  BP: (!) 144/68  Pulse: 60  Temp: 98.1 F (36.7 C)  SpO2: 93%  Weight: 174 lb 9.6 oz (79.2 kg)   Body mass index is 25.78 kg/m.     12/11/2022   10:00 AM 07/23/2022    5:03 PM 07/23/2022    9:01 AM 07/16/2022   11:14 AM 01/31/2022    8:43 AM 01/03/2022    3:03 PM 06/16/2021    8:51 AM  Advanced Directives  Does Patient Have a Medical Advance Directive? Yes Yes Yes Yes Yes Yes Yes  Type of Estate agent of [place];Living will Living will   Healthcare Power of [place];Living will Living will;Healthcare Power of Attorney Living will  Does patient want to make changes to medical advance directive?  No - Patient declined No - Patient declined No - Patient declined     Copy of Healthcare Power of Attorney in Chart? No - copy requested          Current Medications (verified) Outpatient Encounter Medications as of 12/11/2022  Medication Sig   ACCU-CHEK GUIDE test strip TEST BLOOD SUGAR TWICE DAILY   alfuzosin (UROXATRAL) 10 MG 24 hr tablet Take 10 mg by mouth daily with breakfast.   amLODipine (NORVASC) 10 MG tablet TAKE 1 TABLET EVERY DAY   Ascorbic Acid (VITAMIN C) 1000 MG tablet Take 1,000 mg by mouth daily.   aspirin 81 MG tablet Take 81 mg by mouth daily.   azelastine (ASTELIN) 0.1 % nasal spray Place 2 sprays into both nostrils 2 (two) times daily.   carvedilol (COREG) 12.5 MG tablet Take 1 tablet (12.5 mg total) by mouth 2 (two) times daily with a meal.   cloNIDine (CATAPRES) 0.1 MG tablet Take 0.1 mg by mouth daily.   empagliflozin (JARDIANCE) 10 MG TABS tablet Take 1 tablet (10 mg total) by mouth daily before  breakfast.   ferrous sulfate 325 (65 FE) MG EC tablet Take 325 mg by mouth daily.   furosemide (LASIX) 40 MG tablet Take 20 mg by mouth 2 (two) times daily.   Glucosamine-Chondroit-Vit C-Mn (GLUCOSAMINE 1500 COMPLEX) CAPS Take by mouth.   glucose blood (ACCU-CHEK GUIDE) test strip Use to check blood sugars 2 times daily   HUMALOG KWIKPEN 100 UNIT/ML KwikPen Inject into the skin. On sliding scale   hydrALAZINE (APRESOLINE) 50 MG tablet Take by mouth.   Insulin Pen Needle (DROPLET PEN NEEDLES) 31G X 5 MM MISC USE AS DIRECTED SUBCUTANEOUSLY TWO TIMES DAILY   Lancets Misc. (ACCU-CHEK SOFTCLIX LANCET DEV) KIT    LANTUS SOLOSTAR 100 UNIT/ML Solostar Pen Inject into the skin.   lisinopril (ZESTRIL) 20 MG tablet Take 20 mg by mouth daily.   Multiple Vitamins-Minerals (CENTRUM SILVER ADULT 50+ PO) Take by mouth.   Omega-3 1400 MG CAPS Take 1,400 mg by mouth daily.   pregabalin (LYRICA) 25 MG capsule Take 1 capsule (25 mg total) by mouth 2 (two) times daily.   repaglinide (PRANDIN) 1 MG tablet TAKE 1 TABLET BEFORE BREAKFAST AND TAKE 1 TABLET BEFORE LUNCH   sertraline (ZOLOFT) 50 MG tablet Take 50 mg by mouth daily.   simvastatin (ZOCOR) 40 MG tablet Take 1 tablet (  40 mg total) by mouth daily.   sodium chloride (OCEAN) 0.65 % SOLN nasal spray Place 1 spray into both nostrils as needed for congestion.   TRUEplus Lancets 28G MISC TEST BLOOD SUGAR TWICE DAILY   [DISCONTINUED] Boswellia-Glucosamine-Vit D (GLUCOSAMINE COMPLEX/VITAMIN D3) TABS Take by mouth.   insulin aspart protamine- aspart (NOVOLOG MIX 70/30) (70-30) 100 UNIT/ML injection Inject 0.16 mLs (16 Units total) into the skin daily with supper.   insulin aspart protamine- aspart (NOVOLOG MIX 70/30) (70-30) 100 UNIT/ML injection Inject 0.25 mLs (25 Units total) into the skin daily with breakfast.  Discard vial 28 days after first puncture.   [DISCONTINUED] empagliflozin (JARDIANCE) 10 MG TABS tablet Take 1 tablet (10 mg total) by mouth daily before  breakfast.   No facility-administered encounter medications on file as of 12/11/2022.    Allergies (verified) Exenatide, Linagliptin, and Lasix [furosemide]   History: Past Medical History:  Diagnosis Date   Allergy    Anemia    Asthma    Atherosclerosis of abdominal aorta (HCC) 02/26/2020   Seen on x-ray L-spine September 2021   BPH (benign prostatic hyperplasia)    CAD (coronary artery disease)    a. s/p MI 09/1993;  b. known CTO of RCA;  c. 06/2014 Cath: LM short/mod dzs, LAD 95ost, LCX nl, RI nl, RCA 100, EF 55%;  c. 06/2014 s/p CABG x 4: LIMA->LAD, VG->D1, VG->OM1, VG->Acute Marginal.   Carotid stenosis    a. Carotid US 1/17:  RICA 40-59%; LICA 60-79% // Carotid US 1/22: R 1-39; L 40-59; L VA aberrant flow; bilat subclavian stenosis // Carotid US 1/23: R 1-39, L 40-59; R subclavian stenosis   CKD (chronic kidney disease), stage III (HCC)    Constipation    in the past 2-3 weeks   Diabetes (HCC)    AODM   Diastolic dysfunction    a. 05/2015 Echo: EF 60-65%, LVH, Gr 1 DD, mild MR, mod dil RA/LA, mod TR, PASP .   Essential hypertension    GERD (gastroesophageal reflux disease)    History of kidney stones    Hyperlipidemia    Past Surgical History:  Procedure Laterality Date   ANGIOPLASTY  09/22/1993   POBA of RCA (Dr. Jonette Eva)   BACK SURGERY  2000   back fusion - Dr. Julio Sicks   CARDIAC CATHETERIZATION  11/28/1998   "silent occlusion" of RCA w/collaterals from left coronary system, prox LAD w/40-50% eccentric narrowing, 1st diagonal with 70-80% eccentric narrowing, 85% narrowing of prox small OM1 (Dr. Jonette Eva)   CAROTID DOPPLER  07/2011   left subclavian (50-69%); right bulb (0-49%); RICA (normal); left mid-distal CCA (0-49%); left bulb/prox ICA (50-69%); left vertebral with abnormal antegrade flow   CORONARY ARTERY BYPASS GRAFT N/A 05/21/2014   Procedure: CORONARY ARTERY BYPASS GRAFTING (CABG) x  four, using left internal mammary artery and right leg greater  saphenous vein harvested endoscopically;  Surgeon: Loreli Slot, MD;  Location: [facility] OR;  Service: Open Heart Surgery;  Laterality: N/A;   INTRAOPERATIVE TRANSESOPHAGEAL ECHOCARDIOGRAM N/A 05/21/2014   Procedure: INTRAOPERATIVE TRANSESOPHAGEAL ECHOCARDIOGRAM;  Surgeon: Loreli Slot, MD;  Location[facility];  Service: Open Heart Surgery;  Laterality: N/A;   LEFT HEART CATHETERIZATION WITH CORONARY ANGIOGRAM N/A 05/19/2014   Procedure: LEFT HEART CATHETERIZATION WITH CORONARY ANGIOGRAM;  Surgeon: Micheline Chapman, MD;  Location: [facility] CATH LAB;  Service: Cardiovascular;  Laterality: N/A;   LUMBAR LAMINECTOMY/DECOMPRESSION MICRODISCECTOMY Left 09/23/2020   Procedure: Laminectomy and Foraminotomy - left - Lumbar three-Lumbar four;  Surgeon: Julio Sicks, MD;  Location: [facility] OR;  Service: Neurosurgery;  Laterality: Left;   NASAL SINUS SURGERY  2010   NM MYOCAR PERF WALL MOTION  09/19/2009   bruce myoview - mild perfusion defect in basal inferior region (infarct/scar), EF 60%, low risk scan   RENAL DOPPLER  10/2011   SMA w/ 70-99% diameter reduction & high grade stenosis; R&L renals w/narrowing and increased velocities (60-99%), R kidney smaller than L   TRANSTHORACIC ECHOCARDIOGRAM  10/20/2012   EF 55-60%, moderate concentric hypertrophy, ventricular septum thickness increased, calcified MV annulus   VASECTOMY  1963   Family History  Problem Relation Age of Onset   Uterine cancer Mother    Stroke Father    Diabetes Sister    Colon polyps Brother    Heart disease Brother    Colon cancer Neg Hx    Esophageal cancer Neg Hx    Rectal cancer Neg Hx    Stomach cancer Neg Hx    Social History   Socioeconomic History   Marital status: Divorced    Spouse name: Not on file   Number of children: 2   Years of education: Not on file   Highest education level: Not on file  Occupational History   Occupation: Retired   Tobacco Use   Smoking status: Former    Types: Cigarettes    Quit date:  06/04/1972    Years since quitting: 50.5   Smokeless tobacco: Current    Types: Chew   Tobacco comments:    quit about 40 years  Vaping Use   Vaping Use: Never used  Substance and Sexual Activity   Alcohol use: Yes    Alcohol/week: 3.0 standard drinks of alcohol    Types: 3 Standard drinks or equivalent per week    Comment: a glass of red wine 3 or 4 days a week.   Drug use: No   Sexual activity: Not on file  Other Topics Concern   Not on file  Social History Narrative   Recently got puppy May 2020   Long-term girlfriend of 17 years died 06-09-16; complications from diabetes   Daughter lives nearby and assists as needed    Social Determinants of Health   Financial Resource Strain: Low Risk  (12/11/2022)   Overall Financial Resource Strain (CARDIA)    Difficulty of Paying Living Expenses: Not hard at all  Food Insecurity: No Food Insecurity (12/11/2022)   Hunger Vital Sign    Worried About Running Out of Food in the Last Year: Never true    Ran Out of Food in the Last Year: Never true  Transportation Needs: No Transportation Needs (12/11/2022)   PRAPARE - Administrator, Civil Service (Medical): No    Lack of Transportation (Non-Medical): No  Physical Activity: Insufficiently Active (12/11/2022)   Exercise Vital Sign    Days of Exercise per Week: 7 days    Minutes of Exercise per Session: 20 min  Stress: No Stress Concern Present (12/11/2022)   [employer] of Occupational Health - Occupational Stress Questionnaire    Feeling of Stress : Only a little  Social Connections: Moderately Integrated (12/11/2022)   Social Connection and Isolation Panel [NHANES]    Frequency of Communication with Friends and Family: More than three times a week    Frequency of Social Gatherings with Friends and Family: Once a week    Attends Religious Services: 1 to 4 times per year    Active Member of [employer]  or Organizations: Yes    Attends Banker Meetings: 1 to 4 times per year     Marital Status: Widowed    Tobacco Counseling Ready to quit: Not Answered Counseling given: Not Answered Tobacco comments: quit about 40 years   Clinical Intake:  Pre-visit preparation completed: Yes  Pain : No/denies pain     BMI - recorded: 25.78 Nutritional Status: BMI 25 -29 Overweight Nutritional Risks: None Diabetes: Yes CBG done?: No Did pt. bring in CBG monitor from home?: No  How often do you need to have someone help you when you read instructions, pamphlets, or other written materials from your doctor or pharmacy?: 1 - Never  Interpreter Needed?: No  Information entered by :: Lanier Ensign, LPN   Activities of Daily Living    12/11/2022   10:01 AM 07/23/2022    5:03 PM  In your present state of health, do you have any difficulty performing the following activities:  Hearing? 1 1  Vision? 1 0  Comment not as good as use to   Difficulty concentrating or making decisions? 0 0  Walking or climbing stairs? 0 0  Dressing or bathing? 0 0  Doing errands, shopping? 0 0  Preparing Food and eating ? Y   Comment faucilty cooks   Using the Toilet? N   In the past six months, have you accidently leaked urine? Y   Comment at times   Do you have problems with loss of bowel control? N   Managing your Medications? Y   Comment assisted with facuilty   Managing your Finances? N   Housekeeping or managing your Housekeeping? N     Patient Care Team: Ardith Dark, MD as PCP - General (Family Medicine) Tonny Bollman, MD as PCP - Cardiology (Cardiology) Keturah Barre, MD as Consulting Physician (Otolaryngology) Reather Littler, MD as Consulting Physician (Endocrinology) Luane School, OD as Consulting Physician (Optometry) Annie Sable, MD as Consulting Physician (Nephrology) Jerilee Field, MD as Consulting Physician (Urology) Annie Sable, MD as Consulting Physician (Nephrology) Antony Contras, MD as Consulting Physician  (Ophthalmology) Erroll Luna, [facility] (Inactive) as Pharmacist (Pharmacist) Alejandro Mulling, RN as [facility] Care Management  Indicate any recent Medical Services you may have received from other than [facility] providers in the past year (date may be approximate).     Assessment:   This is a routine wellness examination for [place].  Hearing/Vision screen Hearing Screening - Comments:: Pt wears hearing aids and had implants  Vision Screening - Comments:: Pt follows up with Dr Randon Goldsmith for annual eye exam   Dietary issues and exercise activities discussed:     Goals Addressed             This Visit's Progress    Patient Stated       None at this time        Depression Screen    12/11/2022    9:57 AM 02/06/2022   11:11 AM 01/03/2022    2:54 PM 06/16/2021    8:49 AM 06/10/2020    8:41 AM 04/24/2019    9:47 AM 04/22/2018    8:43 AM  PHQ 2/9 Scores  PHQ - 2 Score 1 3 1 1 1  0 0  PHQ- 9 Score  5         Fall Risk    12/11/2022   10:01 AM 02/06/2022   10:39 AM 06/16/2021    8:53 AM 06/10/2020    8:46 AM 04/24/2019  9:47 AM  Fall Risk   Falls in the past year? 0 0 1 0 0  Number falls in past yr: 0 0 1 0   Injury with Fall? 0 0 1 0 0  Comment   injured right side rib cage during fall onto equipment    Risk for fall due to : Impaired vision;Impaired balance/gait No Fall Risks History of fall(s);Impaired balance/gait;Impaired vision Impaired balance/gait;Impaired vision;Impaired mobility   Follow up Falls prevention discussed  Falls prevention discussed Falls prevention discussed Falls evaluation completed;Education provided;Falls prevention discussed    MEDICARE RISK AT HOME:  Medicare Risk at Home - 12/11/22 1006     Any stairs in or around the home? No    If so, are there any without handrails? No    Home free of loose throw rugs in walkways, pet beds, electrical cords, etc? Yes    Adequate lighting in your home to reduce risk of falls? Yes    Life alert? Yes     Use of a cane, walker or w/c? No    Grab bars in the bathroom? Yes    Shower chair or bench in shower? Yes    Elevated toilet seat or a handicapped toilet? Yes             TIMED UP AND GO:  Was the test performed?  Yes  Length of time to ambulate 10 feet: 20 sec Gait slow and steady without use of assistive device    Cognitive Function:        12/11/2022   10:03 AM 06/16/2021    8:56 AM 06/10/2020    8:51 AM 04/24/2019    9:46 AM  6CIT Screen  What Year? 0 points 0 points 0 points 0 points  What month? 0 points 0 points 0 points 0 points  What time? 0 points 0 points  0 points  Count back from 20 0 points 0 points 0 points 0 points  Months in reverse 0 points 4 points 4 points 0 points  Repeat phrase 8 points 6 points 4 points 0 points  Total Score 8 points 10 points  0 points    Immunizations Immunization History  Administered Date(s) Administered   Fluad Quad(high Dose 65+) 03/02/2019, 02/15/2020, 02/22/2021, 02/12/2022   Influenza Split 05/04/2003, 02/23/2009, 02/02/2010, 03/05/2011, 02/18/2012, 02/02/2017   Influenza Whole 03/23/2009   Influenza, High Dose Seasonal PF 02/02/2017, 02/17/2018   Influenza-Unspecified 05/04/2003   PFIZER(Purple Top)SARS-COV-2 Vaccination 07/26/2019, 08/25/2019, 03/17/2020   Pfizer Covid-19 Vaccine Bivalent Booster 27yrs & up 03/14/2021   Pneumococcal Conjugate-13 04/12/2014   Pneumococcal Polysaccharide-23 02/23/2009, 03/23/2009   Tdap 12/03/2016   Zoster Recombinant(Shingrix) 06/24/2020, 08/24/2020   Zoster, Live 05/29/2013    TDAP status: Up to date  Flu Vaccine status: Up to date  Pneumococcal vaccine status: Up to date  Covid-19 vaccine status: Completed vaccines  Qualifies for Shingles Vaccine? Yes   Zostavax completed Yes   Shingrix Completed?: Yes  Screening Tests Health Maintenance  Topic Date Due   COVID-19 Vaccine (5 - 2023-24 season) 02/02/2022   FOOT EXAM  02/28/2022   OPHTHALMOLOGY EXAM  05/31/2022    Diabetic kidney evaluation - Urine ACR  09/20/2022   Colonoscopy  12/11/2023 (Originally 12/11/2019)   INFLUENZA VACCINE  01/03/2023   HEMOGLOBIN A1C  01/21/2023   Diabetic kidney evaluation - eGFR measurement  12/07/2023   Medicare Annual Wellness (AWV)  12/11/2023   DTaP/Tdap/Td (2 - Td or Tdap) 12/04/2026   Pneumonia Vaccine 40+ Years old  Completed   Zoster Vaccines- Shingrix  Completed   HPV VACCINES  Aged Out    Health Maintenance  Health Maintenance Due  Topic Date Due   COVID-19 Vaccine (5 - 2023-24 season) 02/02/2022   FOOT EXAM  02/28/2022   OPHTHALMOLOGY EXAM  05/31/2022   Diabetic kidney evaluation - Urine ACR  09/20/2022    Colorectal cancer screening: No longer required.   Additional Screening:   Vision Screening: Recommended annual ophthalmology exams for early detection of glaucoma and other disorders of the eye. Is the patient up to date with their annual eye exam?  no Who is the provider or what is the name of the office in which the patient attends annual eye exams? Dr Randon Goldsmith  If pt is not established with a provider, would they like to be referred to a provider to establish care? No .   Dental Screening: Recommended annual dental exams for proper oral hygiene  Diabetic Foot Exam: Diabetic Foot Exam: Overdue, Pt has been advised about the importance in completing this exam. Pt is scheduled for diabetic foot exam on next appt .  Community Resource Referral / Chronic Care Management: CRR required this visit?  No   CCM required this visit?  No     Plan:     I have personally reviewed and noted the following in the patient's chart:   Medical and social history Use of alcohol, tobacco or illicit drugs  Current medications and supplements including opioid prescriptions. Patient is not currently taking opioid prescriptions. Functional ability and status Nutritional status Physical activity Advanced directives List of other physicians Hospitalizations,  surgeries, and ER visits in previous 12 months Vitals Screenings to include cognitive, depression, and falls Referrals and appointments  In addition, I have reviewed and discussed with patient certain preventive protocols, quality metrics, and best practice recommendations. A written personalized care plan for preventive services as well as general preventive health recommendations were provided to patient.     Marzella Schlein, LPN   0/02/8118   After Visit Summary: (MyChart) Due to this being a telephonic visit, the after visit summary with patients personalized plan was offered to patient via MyChart   Nurse Notes: none

## 2022-12-20 ENCOUNTER — Encounter: Payer: Self-pay | Admitting: Family Medicine

## 2023-02-19 NOTE — Progress Notes (Deleted)
Cardiology Office Note:  .   Date:  02/19/2023  ID:  Justin Salinas, DOB 23-Apr-1941, MRN [id number] PCP: Ardith Dark, MD  [facility] Providers Cardiologist:  Tonny Bollman, MD { Click to update primary MD,subspecialty MD or APP then REFRESH:1}   Patient Profile: .      PMH Coronary artery disease S/p PCI in 1990s S/p CABG 05/2014 Hypertension Hyperlipidemia Chronic kidney disease Type 2 DM Carotid artery disease Chronic leg edema  History of remote PCI in the 1990s and multivessel CABG in 2015.  He has maintained consistent follow-up.  Last cardiology clinic visit was 11/22/2022 with Dr. Excell Seltzer.  He reported living in a skilled nursing facility and getting good care. He reported he "gets tired" with physical activity.  Was hospitalized following a fall in February 2024 at which time he had marked hyperglycemia and was not taking his medications properly. At office visit, he had JVD and LE edema concerning for volume overload. Dr. Excell Seltzer elected not to increase loop diuretic in the setting of advanced age and CKD.  He was started on Jardiance 10 mg daily with follow-up BMET on 12/07/22 that revealed Scr 1.68, stable with addition of Jardiance.        History of Present Illness: .   Justin Salinas is a *** 82 y.o. male who is here today for 3 month follow-up of CHF.   ROS: ***       Studies Reviewed: .        *** Risk Assessment/Calculations:   {Does this patient have ATRIAL FIBRILLATION?:[phone]} No BP recorded.  {Refresh Note OR Click here to enter BP  :1}***       Physical Exam:   VS:  There were no vitals taken for this visit.   Wt Readings from Last 3 Encounters:  12/11/22 174 lb 9.6 oz (79.2 kg)  11/22/22 180 lb 12.8 oz (82 kg)  07/25/22 157 lb 3 oz (71.3 kg)    GEN: Well nourished, well developed in no acute distress NECK: No JVD; No carotid bruits CARDIAC: ***RRR, no murmurs, rubs, gallops RESPIRATORY:  Clear to auscultation without rales,  wheezing or rhonchi  ABDOMEN: Soft, non-tender, non-distended EXTREMITIES:  No edema; No deformity     ASSESSMENT AND PLAN: .    Acute on chronic diastolic CHF: CAD: S/p CABG 2015.  Hypertension Hyperlipidemia    {Are you ordering a CV Procedure (e.g. stress test, cath, DCCV, TEE, etc)?   Press F2        :[id number]}  Dispo: ***  Signed, Eligha Bridegroom, NP-C

## 2023-02-22 ENCOUNTER — Ambulatory Visit: Payer: Medicare HMO | Attending: Nurse Practitioner | Admitting: Nurse Practitioner

## 2023-03-12 LAB — IRON,TIBC AND FERRITIN PANEL
%SAT: 30
Ferritin: 169
Iron: 70
TIBC: 230
UIBC: 160

## 2023-03-12 LAB — COMPREHENSIVE METABOLIC PANEL
Albumin: 4.8 (ref 3.5–5.0)
Calcium: 10 (ref 8.7–10.7)
eGFR: 36

## 2023-03-12 LAB — BASIC METABOLIC PANEL
BUN: 49 — AB (ref 4–21)
CO2: 23 — AB (ref 13–22)
Chloride: 104 (ref 99–108)
Creatinine: 1.8 — AB (ref 0.6–1.3)
Glucose: 81
Potassium: 3.8 meq/L (ref 3.5–5.1)
Sodium: 139 (ref 137–147)

## 2023-03-12 LAB — CBC AND DIFFERENTIAL: Hemoglobin: 12.8 — AB (ref 13.5–17.5)

## 2023-03-13 LAB — LAB REPORT - SCANNED: EGFR: 36

## 2023-03-19 ENCOUNTER — Encounter: Payer: Self-pay | Admitting: Family Medicine

## 2023-04-12 ENCOUNTER — Encounter (INDEPENDENT_AMBULATORY_CARE_PROVIDER_SITE_OTHER): Payer: Self-pay | Admitting: Otolaryngology

## 2023-04-22 ENCOUNTER — Ambulatory Visit: Payer: Medicare HMO | Admitting: Cardiovascular Disease

## 2023-05-10 ENCOUNTER — Institutional Professional Consult (permissible substitution) (INDEPENDENT_AMBULATORY_CARE_PROVIDER_SITE_OTHER): Payer: Medicare HMO

## 2023-06-11 ENCOUNTER — Institutional Professional Consult (permissible substitution) (INDEPENDENT_AMBULATORY_CARE_PROVIDER_SITE_OTHER): Payer: Medicare HMO

## 2023-07-01 ENCOUNTER — Telehealth: Payer: Self-pay | Admitting: Neurology

## 2023-07-01 NOTE — Telephone Encounter (Signed)
Daughter reports Appointment not needed

## 2023-07-09 ENCOUNTER — Ambulatory Visit: Payer: Self-pay | Admitting: Neurology

## 2023-09-24 ENCOUNTER — Emergency Department (HOSPITAL_COMMUNITY)

## 2023-09-24 ENCOUNTER — Emergency Department (HOSPITAL_COMMUNITY)
Admission: EM | Admit: 2023-09-24 | Discharge: 2023-09-24 | Discharge status: Against Medical Advice | Source: Home / Self Care

## 2023-09-24 ENCOUNTER — Other Ambulatory Visit: Payer: Self-pay

## 2023-09-24 ENCOUNTER — Encounter (HOSPITAL_COMMUNITY): Payer: Self-pay

## 2023-09-24 DIAGNOSIS — F0390 Unspecified dementia without behavioral disturbance: Secondary | ICD-10-CM | POA: Insufficient documentation

## 2023-09-24 DIAGNOSIS — N179 Acute kidney failure, unspecified: Secondary | ICD-10-CM | POA: Diagnosis not present

## 2023-09-24 DIAGNOSIS — R059 Cough, unspecified: Secondary | ICD-10-CM | POA: Insufficient documentation

## 2023-09-24 DIAGNOSIS — Z5321 Procedure and treatment not carried out due to patient leaving prior to being seen by health care provider: Secondary | ICD-10-CM | POA: Insufficient documentation

## 2023-09-24 DIAGNOSIS — R5383 Other fatigue: Secondary | ICD-10-CM | POA: Insufficient documentation

## 2023-09-24 LAB — BASIC METABOLIC PANEL WITH GFR
Anion gap: 10 (ref 5–15)
BUN: 115 mg/dL — ABNORMAL HIGH (ref 8–23)
CO2: 20 mmol/L — ABNORMAL LOW (ref 22–32)
Calcium: 9.1 mg/dL (ref 8.9–10.3)
Chloride: 106 mmol/L (ref 98–111)
Creatinine, Ser: 3.27 mg/dL — ABNORMAL HIGH (ref 0.61–1.24)
GFR, Estimated: 18 mL/min — ABNORMAL LOW (ref >60)
Glucose, Bld: 166 mg/dL — ABNORMAL HIGH (ref 70–99)
Potassium: 3.9 mmol/L (ref 3.5–5.1)
Sodium: 136 mmol/L (ref 135–145)

## 2023-09-24 LAB — CBC WITH DIFFERENTIAL/PLATELET
Abs Immature Granulocytes: 0.04 10*3/uL (ref 0.00–0.07)
Basophils Absolute: 0 10*3/uL (ref 0.0–0.1)
Basophils Relative: 0 %
Eosinophils Absolute: 0.3 10*3/uL (ref 0.0–0.5)
Eosinophils Relative: 4 %
HCT: 33.5 % — ABNORMAL LOW (ref 39.0–52.0)
Hemoglobin: 10.7 g/dL — ABNORMAL LOW (ref 13.0–17.0)
Immature Granulocytes: 1 %
Lymphocytes Relative: 19 %
Lymphs Abs: 1.5 10*3/uL (ref 0.7–4.0)
MCH: 33.2 pg (ref 26.0–34.0)
MCHC: 31.9 g/dL (ref 30.0–36.0)
MCV: 104 fL — ABNORMAL HIGH (ref 80.0–100.0)
Monocytes Absolute: 1.2 10*3/uL — ABNORMAL HIGH (ref 0.1–1.0)
Monocytes Relative: 16 %
Neutro Abs: 4.8 10*3/uL (ref 1.7–7.7)
Neutrophils Relative %: 60 %
Platelets: 158 10*3/uL (ref 150–400)
RBC: 3.22 MIL/uL — ABNORMAL LOW (ref 4.22–5.81)
RDW: 12.1 % (ref 11.5–15.5)
WBC: 7.9 10*3/uL (ref 4.0–10.5)
nRBC: 0 % (ref 0.0–0.2)

## 2023-09-24 NOTE — ED Triage Notes (Signed)
 Pt arrived with EMS for fatigue and cough over the last few days. Hx dementia From facility. Per [facility] facility states they did blood work on him and concerned with his kidney function. Denies any other symptoms

## 2023-09-25 ENCOUNTER — Other Ambulatory Visit: Payer: Self-pay

## 2023-09-25 ENCOUNTER — Inpatient Hospital Stay (HOSPITAL_COMMUNITY)
Admission: EM | Admit: 2023-09-25 | Discharge: 2023-09-27 | DRG: 682 | Disposition: A | Discharge status: Home Health | Source: Skilled Nursing Facility | Attending: Internal Medicine | Admitting: Internal Medicine

## 2023-09-25 DIAGNOSIS — Z823 Family history of stroke: Secondary | ICD-10-CM

## 2023-09-25 DIAGNOSIS — F0390 Unspecified dementia without behavioral disturbance: Secondary | ICD-10-CM | POA: Diagnosis present

## 2023-09-25 DIAGNOSIS — I252 Old myocardial infarction: Secondary | ICD-10-CM

## 2023-09-25 DIAGNOSIS — J189 Pneumonia, unspecified organism: Principal | ICD-10-CM | POA: Diagnosis present

## 2023-09-25 DIAGNOSIS — Z8049 Family history of malignant neoplasm of other genital organs: Secondary | ICD-10-CM | POA: Diagnosis not present

## 2023-09-25 DIAGNOSIS — Z888 Allergy status to other drugs, medicaments and biological substances status: Secondary | ICD-10-CM | POA: Diagnosis not present

## 2023-09-25 DIAGNOSIS — E785 Hyperlipidemia, unspecified: Secondary | ICD-10-CM | POA: Diagnosis present

## 2023-09-25 DIAGNOSIS — N179 Acute kidney failure, unspecified: Principal | ICD-10-CM | POA: Diagnosis present

## 2023-09-25 DIAGNOSIS — K219 Gastro-esophageal reflux disease without esophagitis: Secondary | ICD-10-CM | POA: Diagnosis present

## 2023-09-25 DIAGNOSIS — Z5321 Procedure and treatment not carried out due to patient leaving prior to being seen by health care provider: Secondary | ICD-10-CM | POA: Diagnosis present

## 2023-09-25 DIAGNOSIS — N400 Enlarged prostate without lower urinary tract symptoms: Secondary | ICD-10-CM | POA: Diagnosis present

## 2023-09-25 DIAGNOSIS — F17220 Nicotine dependence, chewing tobacco, uncomplicated: Secondary | ICD-10-CM | POA: Diagnosis present

## 2023-09-25 DIAGNOSIS — Z87442 Personal history of urinary calculi: Secondary | ICD-10-CM

## 2023-09-25 DIAGNOSIS — I959 Hypotension, unspecified: Secondary | ICD-10-CM | POA: Diagnosis present

## 2023-09-25 DIAGNOSIS — Z1152 Encounter for screening for COVID-19: Secondary | ICD-10-CM | POA: Diagnosis not present

## 2023-09-25 DIAGNOSIS — I2510 Atherosclerotic heart disease of native coronary artery without angina pectoris: Secondary | ICD-10-CM | POA: Diagnosis present

## 2023-09-25 DIAGNOSIS — E1122 Type 2 diabetes mellitus with diabetic chronic kidney disease: Secondary | ICD-10-CM | POA: Diagnosis present

## 2023-09-25 DIAGNOSIS — Z79899 Other long term (current) drug therapy: Secondary | ICD-10-CM

## 2023-09-25 DIAGNOSIS — Z7984 Long term (current) use of oral hypoglycemic drugs: Secondary | ICD-10-CM

## 2023-09-25 DIAGNOSIS — Z8249 Family history of ischemic heart disease and other diseases of the circulatory system: Secondary | ICD-10-CM

## 2023-09-25 DIAGNOSIS — Z981 Arthrodesis status: Secondary | ICD-10-CM | POA: Diagnosis not present

## 2023-09-25 DIAGNOSIS — F05 Delirium due to known physiological condition: Secondary | ICD-10-CM | POA: Diagnosis present

## 2023-09-25 DIAGNOSIS — I130 Hypertensive heart and chronic kidney disease with heart failure and stage 1 through stage 4 chronic kidney disease, or unspecified chronic kidney disease: Secondary | ICD-10-CM | POA: Diagnosis present

## 2023-09-25 DIAGNOSIS — I5032 Chronic diastolic (congestive) heart failure: Secondary | ICD-10-CM | POA: Diagnosis present

## 2023-09-25 DIAGNOSIS — Z7982 Long term (current) use of aspirin: Secondary | ICD-10-CM

## 2023-09-25 DIAGNOSIS — Z794 Long term (current) use of insulin: Secondary | ICD-10-CM | POA: Diagnosis not present

## 2023-09-25 DIAGNOSIS — Z951 Presence of aortocoronary bypass graft: Secondary | ICD-10-CM

## 2023-09-25 DIAGNOSIS — Z833 Family history of diabetes mellitus: Secondary | ICD-10-CM

## 2023-09-25 DIAGNOSIS — H9190 Unspecified hearing loss, unspecified ear: Secondary | ICD-10-CM | POA: Diagnosis present

## 2023-09-25 DIAGNOSIS — N1831 Chronic kidney disease, stage 3a: Secondary | ICD-10-CM | POA: Diagnosis present

## 2023-09-25 DIAGNOSIS — Z83719 Family history of colon polyps, unspecified: Secondary | ICD-10-CM

## 2023-09-25 LAB — COMPREHENSIVE METABOLIC PANEL WITH GFR
ALT: 16 U/L (ref 0–44)
AST: 19 U/L (ref 15–41)
Albumin: 3.3 g/dL — ABNORMAL LOW (ref 3.5–5.0)
Alkaline Phosphatase: 62 U/L (ref 38–126)
Anion gap: 10 (ref 5–15)
BUN: 132 mg/dL — ABNORMAL HIGH (ref 8–23)
CO2: 19 mmol/L — ABNORMAL LOW (ref 22–32)
Calcium: 8.8 mg/dL — ABNORMAL LOW (ref 8.9–10.3)
Chloride: 105 mmol/L (ref 98–111)
Creatinine, Ser: 3.05 mg/dL — ABNORMAL HIGH (ref 0.61–1.24)
GFR, Estimated: 19 mL/min — ABNORMAL LOW (ref >60)
Glucose, Bld: 297 mg/dL — ABNORMAL HIGH (ref 70–99)
Potassium: 4.3 mmol/L (ref 3.5–5.1)
Sodium: 134 mmol/L — ABNORMAL LOW (ref 135–145)
Total Bilirubin: 0.9 mg/dL (ref 0.0–1.2)
Total Protein: 7 g/dL (ref 6.5–8.1)

## 2023-09-25 LAB — CBC
HCT: 31.2 % — ABNORMAL LOW (ref 39.0–52.0)
Hemoglobin: 10.1 g/dL — ABNORMAL LOW (ref 13.0–17.0)
MCH: 33 pg (ref 26.0–34.0)
MCHC: 32.4 g/dL (ref 30.0–36.0)
MCV: 102 fL — ABNORMAL HIGH (ref 80.0–100.0)
Platelets: 167 10*3/uL (ref 150–400)
RBC: 3.06 MIL/uL — ABNORMAL LOW (ref 4.22–5.81)
RDW: 12 % (ref 11.5–15.5)
WBC: 8.6 10*3/uL (ref 4.0–10.5)
nRBC: 0 % (ref 0.0–0.2)

## 2023-09-25 LAB — CBG MONITORING, ED
Glucose-Capillary: 285 mg/dL — ABNORMAL HIGH (ref 70–99)
Glucose-Capillary: 283 mg/dL — ABNORMAL HIGH (ref 70–99)

## 2023-09-25 LAB — URINALYSIS, ROUTINE W REFLEX MICROSCOPIC
Bilirubin Urine: NEGATIVE
Glucose, UA: >=500 mg/dL — AB
Hgb urine dipstick: NEGATIVE
Ketones, ur: NEGATIVE mg/dL
Leukocytes,Ua: NEGATIVE
Nitrite: NEGATIVE
Protein, ur: NEGATIVE mg/dL
Specific Gravity, Urine: 1.011 (ref 1.005–1.030)
pH: 5 (ref 5.0–8.0)

## 2023-09-25 LAB — GLUCOSE, CAPILLARY
Glucose-Capillary: 155 mg/dL — ABNORMAL HIGH (ref 70–99)
Glucose-Capillary: 164 mg/dL — ABNORMAL HIGH (ref 70–99)

## 2023-09-25 LAB — RESP PANEL BY RT-PCR (RSV, FLU A&B, COVID)  RVPGX2
Influenza A by PCR: NEGATIVE
Influenza B by PCR: NEGATIVE
Resp Syncytial Virus by PCR: NEGATIVE
SARS Coronavirus 2 by RT PCR: NEGATIVE

## 2023-09-25 MED ORDER — INSULIN ASPART 100 UNIT/ML IJ SOLN
0.0000 [IU] | Freq: Every day | INTRAMUSCULAR | Status: DC
  Administered 2023-09-25: 3 [IU] via SUBCUTANEOUS
  Filled 2023-09-25: qty 0.05

## 2023-09-25 MED ORDER — AZITHROMYCIN 250 MG PO TABS
500.0000 mg | ORAL_TABLET | Freq: Once | ORAL | Status: AC
  Administered 2023-09-25: 500 mg via ORAL
  Filled 2023-09-25: qty 2

## 2023-09-25 MED ORDER — SODIUM CHLORIDE 0.9 % IV SOLN
1.0000 g | Freq: Once | INTRAVENOUS | Status: AC
  Administered 2023-09-25: 1 g via INTRAVENOUS
  Filled 2023-09-25: qty 10

## 2023-09-25 MED ORDER — ONDANSETRON HCL 4 MG PO TABS: 4.0000 mg | ORAL_TABLET | Freq: Four times a day (QID) | ORAL | Status: DC | PRN

## 2023-09-25 MED ORDER — LACTATED RINGERS IV BOLUS
1000.0000 mL | Freq: Once | INTRAVENOUS | Status: AC
  Administered 2023-09-25: 1000 mL via INTRAVENOUS

## 2023-09-25 MED ORDER — SODIUM CHLORIDE 0.9 % IV SOLN
500.0000 mg | INTRAVENOUS | Status: DC
  Administered 2023-09-26: 500 mg via INTRAVENOUS
  Filled 2023-09-25 (×2): qty 5

## 2023-09-25 MED ORDER — LACTATED RINGERS IV SOLN: INTRAVENOUS | Status: AC

## 2023-09-25 MED ORDER — ACETAMINOPHEN 650 MG RE SUPP: 650.0000 mg | Freq: Four times a day (QID) | RECTAL | Status: DC | PRN

## 2023-09-25 MED ORDER — SODIUM CHLORIDE 0.9 % IV SOLN
1.0000 g | INTRAVENOUS | Status: DC
  Administered 2023-09-26 – 2023-09-27 (×2): 1 g via INTRAVENOUS
  Filled 2023-09-25 (×2): qty 10

## 2023-09-25 MED ORDER — INSULIN ASPART 100 UNIT/ML IJ SOLN
0.0000 [IU] | Freq: Three times a day (TID) | INTRAMUSCULAR | Status: DC
  Administered 2023-09-25: 8 [IU] via SUBCUTANEOUS
  Administered 2023-09-25: 3 [IU] via SUBCUTANEOUS
  Administered 2023-09-26 (×2): 5 [IU] via SUBCUTANEOUS
  Administered 2023-09-26: 3 [IU] via SUBCUTANEOUS
  Administered 2023-09-27: 8 [IU] via SUBCUTANEOUS
  Administered 2023-09-27: 3 [IU] via SUBCUTANEOUS
  Filled 2023-09-25: qty 0.15

## 2023-09-25 MED ORDER — HEPARIN SODIUM (PORCINE) 5000 UNIT/ML IJ SOLN
5000.0000 [IU] | Freq: Three times a day (TID) | INTRAMUSCULAR | Status: DC
  Administered 2023-09-25 – 2023-09-27 (×6): 5000 [IU] via SUBCUTANEOUS
  Filled 2023-09-25 (×6): qty 1

## 2023-09-25 MED ORDER — ALBUTEROL SULFATE (2.5 MG/3ML) 0.083% IN NEBU: 2.5000 mg | INHALATION_SOLUTION | RESPIRATORY_TRACT | Status: DC | PRN

## 2023-09-25 MED ORDER — ACETAMINOPHEN 325 MG PO TABS: 650.0000 mg | ORAL_TABLET | Freq: Four times a day (QID) | ORAL | Status: DC | PRN

## 2023-09-25 MED ORDER — ONDANSETRON HCL 4 MG/2ML IJ SOLN: 4.0000 mg | Freq: Four times a day (QID) | INTRAMUSCULAR | Status: DC | PRN

## 2023-09-25 MED ORDER — TRAZODONE HCL 50 MG PO TABS: 25.0000 mg | ORAL_TABLET | Freq: Every evening | ORAL | Status: DC | PRN

## 2023-09-25 NOTE — ED Notes (Signed)
 ED TO INPATIENT HANDOFF REPORT  Name/Age/Gender Justin Salinas 83 y.o. male  Code Status    Code Status Orders  (From admission, onward)           Start     Ordered   09/25/23 1027  Full code  Continuous       Question:  By:  Answer:  Consent: discussion documented in EHR   09/25/23 1027           Code Status History     Date Active Date Inactive Code Status Order ID Comments User Context   07/23/2022 1706 07/27/2022 2009 Full Code [id number]  Johnetta Nab, MD Inpatient   09/23/2020 1046 09/23/2020 2253 Full Code [id number]  Agustina Aldrich, MD Inpatient   05/21/2014 1330 05/24/2014 1318 Full Code [id number]  Alvena Jo Inpatient   05/19/2014 1854 05/21/2014 1330 Full Code [id number]  Arlander Bellman, MD Inpatient       Home/SNF/Other Skilled nursing facility  Chief Complaint AKI (acute kidney injury) [facility]) [N17.9]  Level of Care/Admitting Diagnosis ED Disposition     ED Disposition  Admit   Condition  --   Comment  Hospital Area: [facility] [[id number]]  Level of Care: Med-Surg [16]  May admit patient to Ar[facility] Ben[facility] or [facility] Smalling if equivalent level of care is available:: Yes  Covid Evaluation: Confirmed COVID Negative  Diagnosis: AKI (acute kidney injury) [facility]) [[id number]]  Admitting Physician: Gaylin Ke [[id number]]  Attending Physician: Cüneyt.Cox, MIR [place] [[id number]]  Certification:: I certify this patient will need inpatient services for at least 2 midnights  Expected Medical Readiness: 09/28/2023          Medical History Past Medical History:  Diagnosis Date   Allergy    Anemia    Asthma    Atherosclerosis of abdominal aorta (HCC) 02/26/2020   Seen on x-ray L-spine September 2021   BPH (benign prostatic hyperplasia)    CAD (coronary artery disease)    a. s/p MI 09/1993;  b. known CTO of RCA;  c. 06/2014 Cath: LM short/mod dzs, LAD 95ost, LCX nl, RI nl, RCA 100, EF 55%;  c. 06/2014 s/p CABG x 4: LIMA->LAD,  VG->D1, VG->OM1, VG->Acute Marginal.   Carotid stenosis    a. Carotid US  1/17:  RICA 40-59%; LICA 60-79% // Carotid US  1/22: R 1-39; L 40-59; L VA aberrant flow; bilat subclavian stenosis // Carotid US  1/23: R 1-39, L 40-59; R subclavian stenosis   CKD (chronic kidney disease), stage III (HCC)    Constipation    in the past 2-3 weeks   Diabetes (HCC)    AODM   Diastolic dysfunction    a. 05/2015 Echo: EF 60-65%, LVH, Gr 1 DD, mild MR, mod dil RA/LA, mod TR, PASP .   Essential hypertension    GERD (gastroesophageal reflux disease)    History of kidney stones    Hyperlipidemia     Allergies Allergies  Allergen Reactions   Exenatide Other (See Comments)   Linagliptin  Other (See Comments)   Lasix  [Furosemide ] Other (See Comments)    Constipation    IV Location/Drains/Wounds Patient Lines/Drains/Airways Status     Active Line/Drains/Airways     Name Placement date Placement time Site Days   Peripheral IV 09/25/23 18 G Anterior;Distal;Left;Upper Arm 09/25/23  0805  Arm  less than 1            Labs/Imaging Results for orders placed or performed during the hospital encounter of 09/25/23 (from  the past 48 hours)  CBG monitoring, ED     Status: Abnormal   Collection Time: 09/25/23  6:53 AM  Result Value Ref Range   Glucose-Capillary 285 (H) 70 - 99 mg/dL    Comment: Glucose reference range applies only to samples taken after fasting for at least 8 hours.  CBC     Status: Abnormal   Collection Time: 09/25/23  7:58 AM  Result Value Ref Range   WBC 8.6 4.0 - 10.5 K/uL   RBC 3.06 (L) 4.22 - 5.81 MIL/uL   Hemoglobin 10.1 (L) 13.0 - 17.0 g/dL   HCT 16.1 (L) 09.6 - 04.5 %   MCV 102.0 (H) 80.0 - 100.0 fL   MCH 33.0 26.0 - 34.0 pg   MCHC 32.4 30.0 - 36.0 g/dL   RDW 40.9 81.1 - 91.4 %   Platelets 167 150 - 400 K/uL   nRBC 0.0 0.0 - 0.2 %    Comment: Performed at [facility], [address] [place], [place] [address]  Comprehensive metabolic panel      Status: Abnormal   Collection Time: 09/25/23  7:58 AM  Result Value Ref Range   Sodium 134 (L) 135 - 145 mmol/L   Potassium 4.3 3.5 - 5.1 mmol/L   Chloride 105 98 - 111 mmol/L   CO2 19 (L) 22 - 32 mmol/L   Glucose, Bld 297 (H) 70 - 99 mg/dL    Comment: Glucose reference range applies only to samples taken after fasting for at least 8 hours.   BUN 132 (H) 8 - 23 mg/dL    Comment: RESULT CONFIRMED BY MANUAL DILUTION   Creatinine, Ser 3.05 (H) 0.61 - 1.24 mg/dL   Calcium  8.8 (L) 8.9 - 10.3 mg/dL   Total Protein 7.0 6.5 - 8.1 g/dL   Albumin  3.3 (L) 3.5 - 5.0 g/dL   AST 19 15 - 41 U/L   ALT 16 0 - 44 U/L   Alkaline Phosphatase 62 38 - 126 U/L   Total Bilirubin 0.9 0.0 - 1.2 mg/dL   GFR, Estimated 19 (L) >60 mL/min    Comment: (NOTE) Calculated using the CKD-EPI Creatinine Equation (2021)    Anion gap 10 5 - 15    Comment: Performed at [facility], [address], [place], [place] [address]  Resp panel by RT-PCR (RSV, Flu A&B, Covid) Anterior Nasal Swab     Status: None   Collection Time: 09/25/23  7:58 AM   Specimen: Anterior Nasal Swab  Result Value Ref Range   SARS Coronavirus 2 by RT PCR NEGATIVE NEGATIVE    Comment: (NOTE) SARS-CoV-2 target nucleic acids are NOT DETECTED.  The SARS-CoV-2 RNA is generally detectable in upper respiratory specimens during the acute phase of infection. The lowest concentration of SARS-CoV-2 viral copies this assay can detect is 138 copies/mL. A negative result does not preclude SARS-Cov-2 infection and should not be used as the sole basis for treatment or other patient management decisions. A negative result may occur with  improper specimen collection/handling, submission of specimen other than nasopharyngeal swab, presence of viral mutation(s) within the areas targeted by this assay, and inadequate number of viral copies(<138 copies/mL). A negative result must be combined with clinical observations, patient history, and  epidemiological information. The expected result is Negative.  Fact Sheet for Patients:  BloggerCourse.com  Fact Sheet for Healthcare Providers:  SeriousBroker.it  This test is no t yet approved or cleared by the [place]  FDA and  has been authorized for detection and/or diagnosis of SARS-CoV-2 by FDA under an Emergency Use Authorization (EUA). This EUA will remain  in effect (meaning this test can be used) for the duration of the COVID-19 declaration under Section 564(b)(1) of the Act, 21 U.S.C.section 360bbb-3(b)(1), unless the authorization is terminated  or revoked sooner.       Influenza A by PCR NEGATIVE NEGATIVE   Influenza B by PCR NEGATIVE NEGATIVE    Comment: (NOTE) The Xpert Xpress SARS-CoV-2/FLU/RSV plus assay is intended as an aid in the diagnosis of influenza from Nasopharyngeal swab specimens and should not be used as a sole basis for treatment. Nasal washings and aspirates are unacceptable for Xpert Xpress SARS-CoV-2/FLU/RSV testing.  Fact Sheet for Patients: BloggerCourse.com  Fact Sheet for Healthcare Providers: SeriousBroker.it  This test is not yet approved or cleared by the [place]  FDA and has been authorized for detection and/or diagnosis of SARS-CoV-2 by FDA under an Emergency Use Authorization (EUA). This EUA will remain in effect (meaning this test can be used) for the duration of the COVID-19 declaration under Section 564(b)(1) of the Act, 21 U.S.C. section 360bbb-3(b)(1), unless the authorization is terminated or revoked.     Resp Syncytial Virus by PCR NEGATIVE NEGATIVE    Comment: (NOTE) Fact Sheet for Patients: BloggerCourse.com  Fact Sheet for Healthcare Providers: SeriousBroker.it  This test is not yet approved or cleared by the [place]  FDA and has been authorized for  detection and/or diagnosis of SARS-CoV-2 by FDA under an Emergency Use Authorization (EUA). This EUA will remain in effect (meaning this test can be used) for the duration of the COVID-19 declaration under Section 564(b)(1) of the Act, 21 U.S.C. section 360bbb-3(b)(1), unless the authorization is terminated or revoked.  Performed at [facility], [address], [place], [place] [address]   Urinalysis, Routine w reflex microscopic -Urine, Clean Catch     Status: Abnormal   Collection Time: 09/25/23  8:00 AM  Result Value Ref Range   Color, Urine YELLOW YELLOW   APPearance CLEAR CLEAR   Specific Gravity, Urine 1.011 1.005 - 1.030   pH 5.0 5.0 - 8.0   Glucose, UA >=500 (A) NEGATIVE mg/dL   Hgb urine dipstick NEGATIVE NEGATIVE   Bilirubin Urine NEGATIVE NEGATIVE   Ketones, ur NEGATIVE NEGATIVE mg/dL   Protein, ur NEGATIVE NEGATIVE mg/dL   Nitrite NEGATIVE NEGATIVE   Leukocytes,Ua NEGATIVE NEGATIVE   RBC / HPF 0-5 0 - 5 RBC/hpf   WBC, UA 0-5 0 - 5 WBC/hpf   Bacteria, UA RARE (A) NONE SEEN   Squamous Epithelial / HPF 0-5 0 - 5 /HPF   Mucus PRESENT    Hyaline Casts, UA PRESENT     Comment: Performed at [facility], [address] [place], [place] [address]  CBG monitoring, ED     Status: Abnormal   Collection Time: 09/25/23 12:01 PM  Result Value Ref Range   Glucose-Capillary 283 (H) 70 - 99 mg/dL    Comment: Glucose reference range applies only to samples taken after fasting for at least 8 hours.   DG Chest 2 View Result Date: 09/24/2023 CLINICAL DATA:  Fatigue and cough over the last few days EXAM: CHEST - 2 VIEW COMPARISON:  07/16/2022 FINDINGS: Sternotomy and CABG. Perihilar and peribronchial thickening. Left lower lobe atelectasis or pneumonia. No pneumothorax or pleural effusion. Normal cardiomediastinal silhouette. No acute bone abnormality. IMPRESSION: Bronchitis with left lower lobe atelectasis or pneumonia. Electronically Signed   By:  Rozell Cornet M.D.   On: 09/24/2023 18:20    Pending Labs Unresulted Labs (From admission, onward)     Start     Ordered   09/26/23 0500  Hemoglobin A1c  (Glycemic Control (SSI)  Q 4 Hours / Glycemic Control (SSI)  AC +/- HS)  Tomorrow morning,   R       Comments: To assess prior glycemic control    09/25/23 1027   09/26/23 0500  Basic metabolic panel  Tomorrow morning,   R        09/25/23 1027   09/26/23 0500  CBC  Tomorrow morning,   R        09/25/23 1027            Vitals/Pain Today's Vitals   09/25/23 0737 09/25/23 0939 09/25/23 1000 09/25/23 1009  BP:  (!) 117/53 (!) 115/50 (!) 115/50  Pulse:   65 64  Resp:  20 19 16   Temp:   98 F (36.7 C) 98.9 F (37.2 C)  TempSrc:    Oral  SpO2:   95% 94%  Weight: 79.4 kg     Height: 5\' 9"  (1.753 m)     PainSc:        Isolation Precautions No active isolations  Medications Medications  heparin  injection 5,000 Units (has no administration in time range)  insulin  aspart (novoLOG ) injection 0-15 Units (has no administration in time range)  insulin  aspart (novoLOG ) injection 0-5 Units (has no administration in time range)  azithromycin  (ZITHROMAX ) 500 mg in sodium chloride  0.9 % 250 mL IVPB (has no administration in time range)  cefTRIAXone  (ROCEPHIN ) 1 g in sodium chloride  0.9 % 100 mL IVPB (has no administration in time range)  lactated ringers  infusion ( Intravenous New Bag/Given 09/25/23 1141)  acetaminophen  (TYLENOL ) tablet 650 mg (has no administration in time range)    Or  acetaminophen  (TYLENOL ) suppository 650 mg (has no administration in time range)  traZODone  (DESYREL ) tablet 25 mg (has no administration in time range)  ondansetron  (ZOFRAN ) tablet 4 mg (has no administration in time range)    Or  ondansetron  (ZOFRAN ) injection 4 mg (has no administration in time range)  albuterol  (PROVENTIL ) (2.5 MG/3ML) 0.083% nebulizer solution 2.5 mg (has no administration in time range)  lactated ringers  bolus 1,000 mL (0  mLs Intravenous Stopped 09/25/23 1031)  cefTRIAXone  (ROCEPHIN ) 1 g in sodium chloride  0.9 % 100 mL IVPB (0 g Intravenous Stopped 09/25/23 0926)  azithromycin  (ZITHROMAX ) tablet 500 mg (500 mg Oral Given 09/25/23 0807)    Mobility walks with device

## 2023-09-25 NOTE — ED Triage Notes (Signed)
 Patient brought in by his daughter. Patient was here last night but left due to wait time. Patient c/o abnormal labs that were completing at the [facility] [place]. Patient stated he has a cough and he is coughing up phlegm. Daughter stated patient glucose is hi and he has mucous in his eyes and nose. Hx kidney issues per daughter

## 2023-09-25 NOTE — ED Provider Notes (Signed)
 [place] EMERGENCY DEPARTMENT AT [facility] Provider Note   CSN: [id number] Arrival date & time: 09/25/23  0938     History  Chief Complaint  Patient presents with   Abnormal Lab    Justin Salinas is a 83 y.o. male.   Abnormal Lab    Patient presented to the ED couple days ago with complaints of fatigue and cough.  The patient had outpatient laboratory tests that showed abnormal renal function and they suggested he go to the emergency room.  Patient went to the ED yesterday and left before completing evaluation. Patient's daughter states that at the nursing facility they noticed he had been weak for the last few days.  He has had some URI symptoms with coughing.  He has had some drainage and crusting around the eyes.  Daughter states he is been very unsteady and weaker than usual Home Medications Prior to Admission medications   Medication Sig Start Date End Date Taking? Authorizing Provider  ACCU-CHEK GUIDE test strip TEST BLOOD SUGAR TWICE DAILY 12/18/21   Lajean Pike, MD  alfuzosin  (UROXATRAL ) 10 MG 24 hr tablet Take 10 mg by mouth daily with breakfast.    [provider]  amLODipine  (NORVASC ) 10 MG tablet TAKE 1 TABLET EVERY DAY 11/22/21   Arnoldo Lapping, MD  Ascorbic Acid  (VITAMIN C) 1000 MG tablet Take 1,000 mg by mouth daily.    [provider]  aspirin  81 MG tablet Take 81 mg by mouth daily.    [provider]  azelastine  (ASTELIN ) 0.1 % nasal spray Place 2 sprays into both nostrils 2 (two) times daily. 09/19/21   Rodney Clamp, MD  carvedilol  (COREG ) 12.5 MG tablet Take 1 tablet (12.5 mg total) by mouth 2 (two) times daily with a meal. 07/27/22   Macdonald Savoy, MD  cloNIDine (CATAPRES) 0.1 MG tablet Take 0.1 mg by mouth daily.    [provider]  empagliflozin  (JARDIANCE ) 10 MG TABS tablet Take 1 tablet (10 mg total) by mouth daily before breakfast. 11/22/22   Arnoldo Lapping, MD  ferrous sulfate 325 (65 FE) MG EC  tablet Take 325 mg by mouth daily.    [provider]  furosemide  (LASIX ) 40 MG tablet Take 20 mg by mouth 2 (two) times daily.    [provider]  Glucosamine-Chondroit-Vit C-Mn (GLUCOSAMINE 1500 COMPLEX) CAPS Take by mouth.    [provider]  glucose blood (ACCU-CHEK GUIDE) test strip Use to check blood sugars 2 times daily 09/05/18   Lajean Pike, MD  HUMALOG KWIKPEN 100 UNIT/ML KwikPen Inject into the skin. On sliding scale 11/01/22   [provider]  hydrALAZINE  (APRESOLINE ) 50 MG tablet Take by mouth. 05/13/22   [provider]  insulin  aspart protamine - aspart (NOVOLOG  MIX 70/30) (70-30) 100 UNIT/ML injection Inject 0.16 mLs (16 Units total) into the skin daily with supper. 07/27/22   Macdonald Savoy, MD  insulin  aspart protamine - aspart (NOVOLOG  MIX 70/30) (70-30) 100 UNIT/ML injection Inject 0.25 mLs (25 Units total) into the skin daily with breakfast.  Discard vial 28 days after first puncture. 07/27/22   Macdonald Savoy, MD  Insulin  Pen Needle (DROPLET PEN NEEDLES) 31G X 5 MM MISC USE AS DIRECTED SUBCUTANEOUSLY TWO TIMES DAILY 03/09/22   Lajean Pike, MD  Lancets Misc. (ACCU-CHEK SOFTCLIX LANCET DEV) KIT  08/21/13   [provider]  LANTUS SOLOSTAR 100 UNIT/ML Solostar Pen Inject into the skin. 12/10/22   [provider]  lisinopril  (ZESTRIL )  20 MG tablet Take 20 mg by mouth daily.    [provider]  Multiple Vitamins-Minerals (CENTRUM SILVER  ADULT 50+ PO) Take by mouth.    [provider]  Omega-3 1400 MG CAPS Take 1,400 mg by mouth daily.    [provider]  pregabalin  (LYRICA ) 25 MG capsule Take 1 capsule (25 mg total) by mouth 2 (two) times daily. 08/22/21   Rodney Clamp, MD  repaglinide  (PRANDIN ) 1 MG tablet TAKE 1 TABLET BEFORE BREAKFAST AND TAKE 1 TABLET BEFORE LUNCH 08/06/22   Lajean Pike, MD  sertraline  (ZOLOFT ) 50 MG tablet Take 50 mg by mouth daily. 11/03/22   [provider]   simvastatin  (ZOCOR ) 40 MG tablet Take 1 tablet (40 mg total) by mouth daily. 12/08/21   Arnoldo Lapping, MD  sodium chloride  (OCEAN) 0.65 % SOLN nasal spray Place 1 spray into both nostrils as needed for congestion.    [provider]  TRUEplus Lancets 28G MISC TEST BLOOD SUGAR TWICE DAILY 07/25/21   Lajean Pike, MD      Allergies    Exenatide, Linagliptin , and Lasix  [furosemide ]    Review of Systems   Review of Systems  Physical Exam Updated Vital Signs BP (!) 117/53   Pulse 69   Temp 98.9 F (37.2 C) (Oral)   Resp 20   Ht 1.753 m (5\' 9" )   Wt 79.4 kg   SpO2 90%   BMI 25.84 kg/m  Physical Exam Vitals and nursing note reviewed.  Constitutional:      Appearance: He is well-developed.     Comments: Elderly frail  HENT:     Head: Normocephalic and atraumatic.     Right Ear: External ear normal.     Left Ear: External ear normal.  Eyes:     General: No scleral icterus.       Right eye: No discharge.        Left eye: No discharge.     Conjunctiva/sclera: Conjunctivae normal.     Comments: Some yellow crusting drainage around the eye  Neck:     Trachea: No tracheal deviation.  Cardiovascular:     Rate and Rhythm: Normal rate and regular rhythm.  Pulmonary:     Effort: Pulmonary effort is normal. No respiratory distress.     Breath sounds: Normal breath sounds. No stridor. No wheezing or rales.  Abdominal:     General: Bowel sounds are normal. There is no distension.     Palpations: Abdomen is soft.     Tenderness: There is no abdominal tenderness. There is no guarding or rebound.  Musculoskeletal:        General: No tenderness or deformity.     Cervical back: Neck supple.  Skin:    General: Skin is warm and dry.     Findings: No rash.  Neurological:     General: No focal deficit present.     Mental Status: He is alert.     Cranial Nerves: No cranial nerve deficit, dysarthria or facial asymmetry.     Sensory: No sensory deficit.     Motor: No abnormal  muscle tone or seizure activity.     Coordination: Coordination normal.     Comments: Patient able to stand at the bedside  Psychiatric:        Mood and Affect: Mood normal.     ED Results / Procedures / Treatments   Labs (all labs ordered are listed, but only abnormal results are displayed) Labs Reviewed  CBC - Abnormal; Notable for the following components:      Result Value   RBC 3.06 (*)    Hemoglobin 10.1 (*)    HCT 31.2 (*)    MCV 102.0 (*)    All other components within normal limits  COMPREHENSIVE METABOLIC PANEL WITH GFR - Abnormal; Notable for the following components:   Sodium 134 (*)    CO2 19 (*)    Glucose, Bld 297 (*)    BUN 132 (*)    Creatinine, Ser 3.05 (*)    Calcium  8.8 (*)    Albumin  3.3 (*)    GFR, Estimated 19 (*)    All other components within normal limits  URINALYSIS, ROUTINE W REFLEX MICROSCOPIC - Abnormal; Notable for the following components:   Glucose, UA >=500 (*)    Bacteria, UA RARE (*)    All other components within normal limits  CBG MONITORING, ED - Abnormal; Notable for the following components:   Glucose-Capillary 285 (*)    All other components within normal limits  RESP PANEL BY RT-PCR (RSV, FLU A&B, COVID)  [id number]    EKG EKG Interpretation Date/Time:  Wednesday September 25 2023 07:40:41 EDT Ventricular Rate:  76 PR Interval:  225 QRS Duration:  90 QT Interval:  408 QTC Calculation: 459 R Axis:   80  Text Interpretation: Sinus rhythm Prolonged PR interval Confirmed by Trish Furl [id number]) on 09/25/2023 7:45:45 AM  Radiology DG Chest 2 View Result Date: 09/24/2023 CLINICAL DATA:  Fatigue and cough over the last few days EXAM: CHEST - 2 VIEW COMPARISON:  07/16/2022 FINDINGS: Sternotomy and CABG. Perihilar and peribronchial thickening. Left lower lobe atelectasis or pneumonia. No pneumothorax or pleural effusion. Normal cardiomediastinal silhouette. No acute bone abnormality. IMPRESSION: Bronchitis with left lower lobe atelectasis or  pneumonia. Electronically Signed   By: Rozell Cornet M.D.   On: 09/24/2023 18:20    Procedures Procedures    Medications Ordered in ED Medications  lactated ringers  bolus 1,000 mL (1,000 mLs Intravenous New Bag/Given 09/25/23 0838)  cefTRIAXone  (ROCEPHIN ) 1 g in sodium chloride  0.9 % 100 mL IVPB (0 g Intravenous Stopped 09/25/23 0926)  azithromycin  (ZITHROMAX ) tablet 500 mg (500 mg Oral Given 09/25/23 6045)    ED Course/ Medical Decision Making/ A&P Clinical Course as of 09/25/23 1006  Wed Sep 25, 2023  0917 Metabolic panel shows increased BUN and creatinine of 132 and 3.05. [JK]  [id number]   Urinalysis not suggestive of infection.  Hemoglobin stable [JK]  0918 COVID flu RSV negative [JK]  0952 323 cc noted on the bladder scan.  No signs of urinary retention [JK]  1006 Case discussed with Dr Lowell Rude regarding admission [JK]    Clinical Course User Index [JK] Trish Furl, MD                                 Medical Decision Making Problems Addressed: AKI (acute kidney injury) [facility]): acute illness or injury that poses a threat to life or bodily functions Community acquired pneumonia, unspecified laterality: acute illness or injury that poses a threat to life or bodily functions  Amount and/or Complexity of Data Reviewed Labs: ordered. Decision-making details documented in ED Course.  Risk Prescription drug management. Decision regarding hospitalization.  Labs reviewed from the visit yesterday.  Patient noted to have hemoglobin of 10.7.  Similar to previous values.  Metabolic panel however showed a bicarb of 28 glucose 166 BUN 115  and a creatinine of 3.27.  Previous labs showed creatinines of 1.8 and 2.0.  3.27 significantly increased compared to the baseline as is the BUN.  Patient also had a chest x-ray yesterday that showed bronchitis with atelectasis and/or pneumonia on the lower left side  Patient's laboratory test today show persistent increase in his BUN and creatinine.  Possible  this is related to dehydration and decreased p.o. intake associated with his pneumonia.  Will order IV fluids.  I have also ordered a bladder scan to make sure there is no evidence of urinary retention.  Patient has currently able to urinate without difficulty and denies any symptoms.  IV antibiotics have been ordered to treat his pneumonia noted on x-ray yesterday.  I will consult the medical service for admission further treatment        Final Clinical Impression(s) / ED Diagnoses Final diagnoses:  Community acquired pneumonia, unspecified laterality  AKI (acute kidney injury) [facility])    Rx / DC Orders ED Discharge Orders     None         Trish Furl, MD 09/25/23 1006

## 2023-09-25 NOTE — H&P (Signed)
 History and Physical  Kamau Weatherall Bloyd WUJ:[id number] DOB: 1941/02/09 DOA: 09/25/2023  PCP: Rodney Clamp, MD   Chief Complaint: Fatigue, cough, abnormal labs  HPI: Justin Salinas is a 83 y.o. male with medical history significant for CKD stage III, CAD, insulin -dependent type 2 diabetes, chronic heart failure with preserved EF, hypertension, GERD, hyperlipidemia being admitted to the hospital with AKI in the setting of suspected community-acquired pneumonia.  History is provided by the patient's daughter, who is at the bedside.  The patient is at an assisted living facility, patient's daughter says that for a couple of days he has been having some fatigue and cough, sleeping a lot and mildly confused.  He has been coughing up some greenish colored sputum.  She thinks it is possible he has been missing some meals at his facility due to his somnolence.  No fevers as far she knows.  He has been ambulating, but seems to be more unsteady and weak on his feet than usual.  Came to the ER last night, had some lab work done but left prior to being evaluated due to long wait times.  Return early this morning.  Review of Systems: Please see HPI for pertinent positives and negatives. A complete 10 system review of systems are otherwise negative.  Past Medical History:  Diagnosis Date   Allergy    Anemia    Asthma    Atherosclerosis of abdominal aorta (HCC) 02/26/2020   Seen on x-ray L-spine September 2021   BPH (benign prostatic hyperplasia)    CAD (coronary artery disease)    a. s/p MI 09/1993;  b. known CTO of RCA;  c. 06/2014 Cath: LM short/mod dzs, LAD 95ost, LCX nl, RI nl, RCA 100, EF 55%;  c. 06/2014 s/p CABG x 4: LIMA->LAD, VG->D1, VG->OM1, VG->Acute Marginal.   Carotid stenosis    a. Carotid US  1/17:  RICA 40-59%; LICA 60-79% // Carotid US  1/22: R 1-39; L 40-59; L VA aberrant flow; bilat subclavian stenosis // Carotid US  1/23: R 1-39, L 40-59; R subclavian stenosis   CKD (chronic kidney  disease), stage III (HCC)    Constipation    in the past 2-3 weeks   Diabetes (HCC)    AODM   Diastolic dysfunction    a. 05/2015 Echo: EF 60-65%, LVH, Gr 1 DD, mild MR, mod dil RA/LA, mod TR, PASP .   Essential hypertension    GERD (gastroesophageal reflux disease)    History of kidney stones    Hyperlipidemia    Past Surgical History:  Procedure Laterality Date   ANGIOPLASTY  09/22/1993   POBA of RCA (Dr. Savilla Curls)   BACK SURGERY  2000   back fusion - Dr. Agustina Aldrich   CARDIAC CATHETERIZATION  11/28/1998   "silent occlusion" of RCA w/collaterals from left coronary system, prox LAD w/40-50% eccentric narrowing, 1st diagonal with 70-80% eccentric narrowing, 85% narrowing of prox small OM1 (Dr. Savilla Curls)   CAROTID DOPPLER  07/2011   left subclavian (50-69%); right bulb (0-49%); RICA (normal); left mid-distal CCA (0-49%); left bulb/prox ICA (50-69%); left vertebral with abnormal antegrade flow   CORONARY ARTERY BYPASS GRAFT N/A 05/21/2014   Procedure: CORONARY ARTERY BYPASS GRAFTING (CABG) x  four, using left internal mammary artery and right leg greater saphenous vein harvested endoscopically;  Surgeon: Zelphia Higashi, MD;  Location: [facility] OR;  Service: Open Heart Surgery;  Laterality: N/A;   INTRAOPERATIVE TRANSESOPHAGEAL ECHOCARDIOGRAM N/A 05/21/2014   Procedure: INTRAOPERATIVE TRANSESOPHAGEAL ECHOCARDIOGRAM;  Surgeon: Landon Pinion  Jayson Michael, MD;  Location: [facility] OR;  Service: Open Heart Surgery;  Laterality: N/A;   LEFT HEART CATHETERIZATION WITH CORONARY ANGIOGRAM N/A 05/19/2014   Procedure: LEFT HEART CATHETERIZATION WITH CORONARY ANGIOGRAM;  Surgeon: Arlander Bellman, MD;  Location: [facility] CATH LAB;  Service: Cardiovascular;  Laterality: N/A;   LUMBAR LAMINECTOMY/DECOMPRESSION MICRODISCECTOMY Left 09/23/2020   Procedure: Laminectomy and Foraminotomy - left - Lumbar three-Lumbar four;  Surgeon: Agustina Aldrich, MD;  Location: [facility] OR;  Service: Neurosurgery;  Laterality: Left;   NASAL  SINUS SURGERY  2010   NM MYOCAR PERF WALL MOTION  09/19/2009   bruce myoview - mild perfusion defect in basal inferior region (infarct/scar), EF 60%, low risk scan   RENAL DOPPLER  10/2011   SMA w/ 70-99% diameter reduction & high grade stenosis; R&L renals w/narrowing and increased velocities (60-99%), R kidney smaller than L   TRANSTHORACIC ECHOCARDIOGRAM  10/20/2012   EF 55-60%, moderate concentric hypertrophy, ventricular septum thickness increased, calcified MV annulus   VASECTOMY  1963   Social History:  reports that he quit smoking about 51 years ago. His smoking use included cigarettes. His smokeless tobacco use includes chew. He reports current alcohol use of about 3.0 standard drinks of alcohol per week. He reports that he does not use drugs.  Allergies  Allergen Reactions   Exenatide Other (See Comments)   Linagliptin  Other (See Comments)   Lasix  [Furosemide ] Other (See Comments)    Constipation    Family History  Problem Relation Age of Onset   Uterine cancer Mother    Stroke Father    Diabetes Sister    Colon polyps Brother    Heart disease Brother    Colon cancer Neg Hx    Esophageal cancer Neg Hx    Rectal cancer Neg Hx    Stomach cancer Neg Hx      Prior to Admission medications   Medication Sig Start Date End Date Taking? Authorizing Provider  alfuzosin  (UROXATRAL ) 10 MG 24 hr tablet Take 10 mg by mouth daily with breakfast.    [provider]  amLODipine  (NORVASC ) 10 MG tablet TAKE 1 TABLET EVERY DAY 11/22/21   Arnoldo Lapping, MD  Ascorbic Acid  (VITAMIN C) 1000 MG tablet Take 1,000 mg by mouth daily.    [provider]  aspirin  81 MG tablet Take 81 mg by mouth daily.    [provider]  azelastine  (ASTELIN ) 0.1 % nasal spray Place 2 sprays into both nostrils 2 (two) times daily. 09/19/21   Rodney Clamp, MD  carvedilol  (COREG ) 12.5 MG tablet Take 1 tablet (12.5 mg total) by mouth 2 (two) times daily with a meal. 07/27/22   Macdonald Savoy, MD  cloNIDine (CATAPRES) 0.1 MG tablet Take 0.1 mg by mouth daily.    [provider]  empagliflozin  (JARDIANCE ) 10 MG TABS tablet Take 1 tablet (10 mg total) by mouth daily before breakfast. 11/22/22   Arnoldo Lapping, MD  ferrous sulfate 325 (65 FE) MG EC tablet Take 325 mg by mouth daily.    [provider]  furosemide  (LASIX ) 40 MG tablet Take 20 mg by mouth 2 (two) times daily.    [provider]  Glucosamine-Chondroit-Vit C-Mn (GLUCOSAMINE 1500 COMPLEX) CAPS Take by mouth.    [provider]  HUMALOG KWIKPEN 100 UNIT/ML KwikPen Inject into the skin. On sliding scale 11/01/22   [provider]  hydrALAZINE  (APRESOLINE ) 50 MG tablet Take by mouth. 05/13/22   [provider]  insulin   aspart protamine - aspart (NOVOLOG  MIX 70/30) (70-30) 100 UNIT/ML injection Inject 0.16 mLs (16 Units total) into the skin daily with supper. 07/27/22   Macdonald Savoy, MD  insulin  aspart protamine - aspart (NOVOLOG  MIX 70/30) (70-30) 100 UNIT/ML injection Inject 0.25 mLs (25 Units total) into the skin daily with breakfast.  Discard vial 28 days after first puncture. 07/27/22   Macdonald Savoy, MD  LANTUS SOLOSTAR 100 UNIT/ML Solostar Pen Inject into the skin. 12/10/22   [provider]  lisinopril  (ZESTRIL ) 20 MG tablet Take 20 mg by mouth daily.    [provider]  Multiple Vitamins-Minerals (CENTRUM SILVER  ADULT 50+ PO) Take by mouth.    [provider]  Omega-3 1400 MG CAPS Take 1,400 mg by mouth daily.    [provider]  pregabalin  (LYRICA ) 25 MG capsule Take 1 capsule (25 mg total) by mouth 2 (two) times daily. 08/22/21   Rodney Clamp, MD  repaglinide  (PRANDIN ) 1 MG tablet TAKE 1 TABLET BEFORE BREAKFAST AND TAKE 1 TABLET BEFORE LUNCH 08/06/22   Lajean Pike, MD  sertraline  (ZOLOFT ) 50 MG tablet Take 50 mg by mouth daily. 11/03/22   [provider]  simvastatin  (ZOCOR ) 40 MG tablet Take 1 tablet (40 mg  total) by mouth daily. 12/08/21   Arnoldo Lapping, MD  sodium chloride  (OCEAN) 0.65 % SOLN nasal spray Place 1 spray into both nostrils as needed for congestion.    [provider]    Physical Exam: BP (!) 115/50   Pulse 64   Temp 98.9 F (37.2 C) (Oral)   Resp 16   Ht 5\' 9"  (1.753 m)   Wt 79.4 kg   SpO2 94%   BMI 25.84 kg/m  General:  Alert, oriented, calm, in no acute distress, slightly hard of hearing, his daughter is at the bedside.  He is resting comfortably on room air.  Intermittent wet sounding cough. Cardiovascular: RRR, no murmurs or rubs, no peripheral edema  Respiratory: clear to auscultation bilaterally, no wheezes, no crackles  Abdomen: soft, nontender, nondistended, normal bowel tones heard  Skin: dry, no rashes  Musculoskeletal: no joint effusions, normal range of motion  Psychiatric: appropriate affect, normal speech  Neurologic: extraocular muscles intact, clear speech, moving all extremities with intact sensorium         Labs on Admission:  Basic Metabolic Panel: Recent Labs  Lab 09/24/23 1804 09/25/23 0758  NA 136 134*  K 3.9 4.3  CL 106 105  CO2 20* 19*  GLUCOSE 166* 297*  BUN 115* 132*  CREATININE 3.27* 3.05*  CALCIUM  9.1 8.8*   Liver Function Tests: Recent Labs  Lab 09/25/23 0758  AST 19  ALT 16  ALKPHOS 62  BILITOT 0.9  PROT 7.0  ALBUMIN  3.3*   No results for input(s): "LIPASE", "AMYLASE" in the last 168 hours. No results for input(s): "AMMONIA" in the last 168 hours. CBC: Recent Labs  Lab 09/24/23 1804 09/25/23 0758  WBC 7.9 8.6  NEUTROABS 4.8  --   HGB 10.7* 10.1*  HCT 33.5* 31.2*  MCV 104.0* 102.0*  PLT 158 167   Cardiac Enzymes: No results for input(s): "CKTOTAL", "CKMB", "CKMBINDEX", "TROPONINI" in the last 168 hours. BNP (last 3 results) No results for input(s): "BNP" in the last 8760 hours.  ProBNP (last 3 results) No results for input(s): "PROBNP" in the last 8760 hours.  CBG: Recent Labs  Lab  09/25/23 0653  GLUCAP 285*    Radiological Exams on Admission: DG Chest 2 View  Result Date: 09/24/2023 CLINICAL DATA:  Fatigue and cough over the last few days EXAM: CHEST - 2 VIEW COMPARISON:  07/16/2022 FINDINGS: Sternotomy and CABG. Perihilar and peribronchial thickening. Left lower lobe atelectasis or pneumonia. No pneumothorax or pleural effusion. Normal cardiomediastinal silhouette. No acute bone abnormality. IMPRESSION: Bronchitis with left lower lobe atelectasis or pneumonia. Electronically Signed   By: Rozell Cornet M.D.   On: 09/24/2023 18:20   Assessment/Plan Wilgus Deyton Ament is a 83 y.o. male with medical history significant for CKD stage III, CAD, insulin -dependent type 2 diabetes, chronic heart failure with preserved EF, hypertension, GERD, hyperlipidemia being admitted to the hospital with AKI in the setting of suspected community-acquired pneumonia.  AKI superimposed on CKD stage III-baseline creatinine appears to be about 1.8, currently I suspect he has AKI related to ATN from dehydration as he has not been eating or drinking very well, is moderately hypotensive as well although he is hemodynamically stable. -Inpatient admission -Avoid nephrotoxins -Hydrate with LR overnight -Monitor renal function with daily labs  Community-acquired pneumonia-suspected due to cough, weakness, bronchitis/pneumonia seen on chest x-ray.  However patient is afebrile, no evidence of sepsis. -Empiric IV azithromycin  and IV Rocephin  -Albuterol  neb as needed for cough or wheeze  Hypertension-will resume home medications once reconciled, with caution as the patient is mildly hypotensive  Chronic heart failure with preserved EF-patient appears dry on exam, will hold diuretics for the time being given his acute kidney injury.  Will hydrate gently as above, but with caution to avoid fluid overload.  Insulin -dependent type 2 diabetes-update hemoglobin A1c, carb modified diet, with moderate dose  sliding scale.  His basal insulin  will be resumed once reconciled.  DVT prophylaxis: Subcutaneous heparin     Code Status: Full Code, confirmed with the patient's daughter at the bedside at the time of admission.  Consults called: None  Admission status: The appropriate patient status for this patient is INPATIENT. Inpatient status is judged to be reasonable and necessary in order to provide the required intensity of service to ensure the patient's safety. The patient's presenting symptoms, physical exam findings, and initial radiographic and laboratory data in the context of their chronic comorbidities is felt to place them at high risk for further clinical deterioration. Furthermore, it is not anticipated that the patient will be medically stable for discharge from the hospital within 2 midnights of admission.    I certify that at the point of admission it is my clinical judgment that the patient will require inpatient hospital care spanning beyond 2 midnights from the point of admission due to high intensity of service, high risk for further deterioration and high frequency of surveillance required  Time spent: 59 minutes  Jedrek Dinovo Rickey Charm MD [facility] Pager [phone]  If 7PM-7AM, please contact night-coverage [email or web address] Password [facility]  09/25/2023, 10:27 AM

## 2023-09-25 NOTE — Plan of Care (Signed)
  Problem: Education: Goal: Ability to describe self-care measures that may prevent or decrease complications (Diabetes Survival Skills Education) will improve Outcome: Progressing Goal: Individualized Educational Video(s) Outcome: Progressing   Problem: Coping: Goal: Ability to adjust to condition or change in health will improve Outcome: Progressing   Problem: Fluid Volume: Goal: Ability to maintain a balanced intake and output will improve Outcome: Progressing   Problem: Health Behavior/Discharge Planning: Goal: Ability to identify and utilize available resources and services will improve Outcome: Progressing Goal: Ability to manage health-related needs will improve Outcome: Progressing   Problem: Skin Integrity: Goal: Risk for impaired skin integrity will decrease Outcome: Progressing   Problem: Nutritional: Goal: Maintenance of adequate nutrition will improve Outcome: Progressing Goal: Progress toward achieving an optimal weight will improve Outcome: Progressing   Problem: Tissue Perfusion: Goal: Adequacy of tissue perfusion will improve Outcome: Progressing

## 2023-09-26 DIAGNOSIS — N179 Acute kidney failure, unspecified: Secondary | ICD-10-CM | POA: Diagnosis not present

## 2023-09-26 LAB — BASIC METABOLIC PANEL WITH GFR
Anion gap: 9 (ref 5–15)
BUN: 93 mg/dL — ABNORMAL HIGH (ref 8–23)
CO2: 23 mmol/L (ref 22–32)
Calcium: 8.9 mg/dL (ref 8.9–10.3)
Chloride: 108 mmol/L (ref 98–111)
Creatinine, Ser: 2.31 mg/dL — ABNORMAL HIGH (ref 0.61–1.24)
GFR, Estimated: 27 mL/min — ABNORMAL LOW (ref >60)
Glucose, Bld: 180 mg/dL — ABNORMAL HIGH (ref 70–99)
Potassium: 3.6 mmol/L (ref 3.5–5.1)
Sodium: 140 mmol/L (ref 135–145)

## 2023-09-26 LAB — CBC
HCT: 32.7 % — ABNORMAL LOW (ref 39.0–52.0)
Hemoglobin: 10.6 g/dL — ABNORMAL LOW (ref 13.0–17.0)
MCH: 33.1 pg (ref 26.0–34.0)
MCHC: 32.4 g/dL (ref 30.0–36.0)
MCV: 102.2 fL — ABNORMAL HIGH (ref 80.0–100.0)
Platelets: 183 10*3/uL (ref 150–400)
RBC: 3.2 MIL/uL — ABNORMAL LOW (ref 4.22–5.81)
RDW: 12 % (ref 11.5–15.5)
WBC: 8.2 10*3/uL (ref 4.0–10.5)
nRBC: 0 % (ref 0.0–0.2)

## 2023-09-26 LAB — GLUCOSE, CAPILLARY
Glucose-Capillary: 186 mg/dL — ABNORMAL HIGH (ref 70–99)
Glucose-Capillary: 218 mg/dL — ABNORMAL HIGH (ref 70–99)
Glucose-Capillary: 228 mg/dL — ABNORMAL HIGH (ref 70–99)
Glucose-Capillary: 173 mg/dL — ABNORMAL HIGH (ref 70–99)

## 2023-09-26 LAB — PROCALCITONIN: Procalcitonin: 0.25 ng/mL

## 2023-09-26 LAB — HEMOGLOBIN A1C
Hgb A1c MFr Bld: 7 % — ABNORMAL HIGH (ref 4.8–5.6)
Mean Plasma Glucose: 154.2 mg/dL

## 2023-09-26 MED ORDER — REVEFENACIN 175 MCG/3ML IN SOLN
175.0000 ug | Freq: Every day | RESPIRATORY_TRACT | Status: DC
  Administered 2023-09-26: 175 ug via RESPIRATORY_TRACT
  Filled 2023-09-26 (×2): qty 3

## 2023-09-26 MED ORDER — ALFUZOSIN HCL ER 10 MG PO TB24
10.0000 mg | ORAL_TABLET | Freq: Every day | ORAL | Status: DC
  Administered 2023-09-27: 10 mg via ORAL
  Filled 2023-09-26: qty 1

## 2023-09-26 MED ORDER — LORAZEPAM 0.5 MG PO TABS
0.5000 mg | ORAL_TABLET | Freq: Two times a day (BID) | ORAL | Status: DC | PRN
  Administered 2023-09-27: 0.5 mg via ORAL
  Filled 2023-09-26: qty 1

## 2023-09-26 MED ORDER — ARFORMOTEROL TARTRATE 15 MCG/2ML IN NEBU
15.0000 ug | INHALATION_SOLUTION | Freq: Two times a day (BID) | RESPIRATORY_TRACT | Status: DC
  Administered 2023-09-26 (×2): 15 ug via RESPIRATORY_TRACT
  Filled 2023-09-26 (×4): qty 2

## 2023-09-26 MED ORDER — ASPIRIN 81 MG PO TBEC
81.0000 mg | DELAYED_RELEASE_TABLET | Freq: Every day | ORAL | Status: DC
  Administered 2023-09-26 – 2023-09-27 (×2): 81 mg via ORAL
  Filled 2023-09-26 (×2): qty 1

## 2023-09-26 MED ORDER — SENNOSIDES-DOCUSATE SODIUM 8.6-50 MG PO TABS: 1.0000 | ORAL_TABLET | Freq: Every evening | ORAL | Status: DC | PRN

## 2023-09-26 MED ORDER — GUAIFENESIN 100 MG/5ML PO LIQD: 5.0000 mL | ORAL | Status: DC | PRN

## 2023-09-26 MED ORDER — CARVEDILOL 12.5 MG PO TABS
12.5000 mg | ORAL_TABLET | Freq: Two times a day (BID) | ORAL | Status: DC
  Administered 2023-09-26 – 2023-09-27 (×2): 12.5 mg via ORAL
  Filled 2023-09-26 (×2): qty 1

## 2023-09-26 MED ORDER — HYDRALAZINE HCL 20 MG/ML IJ SOLN: 10.0000 mg | INTRAMUSCULAR | Status: DC | PRN

## 2023-09-26 MED ORDER — SODIUM CHLORIDE 0.9 % IV SOLN: INTRAVENOUS | Status: AC

## 2023-09-26 MED ORDER — SERTRALINE HCL 100 MG PO TABS
100.0000 mg | ORAL_TABLET | Freq: Every day | ORAL | Status: DC
  Administered 2023-09-26 – 2023-09-27 (×2): 100 mg via ORAL
  Filled 2023-09-26 (×2): qty 1

## 2023-09-26 MED ORDER — DM-GUAIFENESIN ER 30-600 MG PO TB12
2.0000 | ORAL_TABLET | Freq: Two times a day (BID) | ORAL | Status: DC
  Administered 2023-09-26 – 2023-09-27 (×3): 2 via ORAL
  Filled 2023-09-26: qty 1
  Filled 2023-09-26 (×2): qty 2

## 2023-09-26 MED ORDER — FINASTERIDE 5 MG PO TABS
5.0000 mg | ORAL_TABLET | Freq: Every day | ORAL | Status: DC
  Administered 2023-09-26 – 2023-09-27 (×2): 5 mg via ORAL
  Filled 2023-09-26 (×2): qty 1

## 2023-09-26 MED ORDER — RISPERIDONE 0.25 MG PO TABS
0.5000 mg | ORAL_TABLET | Freq: Two times a day (BID) | ORAL | Status: DC
  Administered 2023-09-26 – 2023-09-27 (×3): 0.5 mg via ORAL
  Filled 2023-09-26 (×3): qty 2

## 2023-09-26 MED ORDER — METOPROLOL TARTRATE 5 MG/5ML IV SOLN: 5.0000 mg | INTRAVENOUS | Status: DC | PRN

## 2023-09-26 NOTE — Plan of Care (Signed)
  Problem: Education: Goal: Ability to describe self-care measures that may prevent or decrease complications (Diabetes Survival Skills Education) will improve Outcome: Progressing Goal: Individualized Educational Video(s) Outcome: Progressing   Problem: Coping: Goal: Ability to adjust to condition or change in health will improve Outcome: Progressing   Problem: Fluid Volume: Goal: Ability to maintain a balanced intake and output will improve Outcome: Progressing   Problem: Health Behavior/Discharge Planning: Goal: Ability to identify and utilize available resources and services will improve Outcome: Progressing Goal: Ability to manage health-related needs will improve Outcome: Progressing   Problem: Metabolic: Goal: Ability to maintain appropriate glucose levels will improve Outcome: Progressing   Problem: Nutritional: Goal: Maintenance of adequate nutrition will improve Outcome: Progressing Goal: Progress toward achieving an optimal weight will improve Outcome: Progressing   Problem: Skin Integrity: Goal: Risk for impaired skin integrity will decrease Outcome: Progressing   Problem: Tissue Perfusion: Goal: Adequacy of tissue perfusion will improve Outcome: Progressing   Problem: Education: Goal: Knowledge of General Education information will improve Description: Including pain rating scale, medication(s)/side effects and non-pharmacologic comfort measures Outcome: Progressing   Problem: Health Behavior/Discharge Planning: Goal: Ability to manage health-related needs will improve Outcome: Progressing   Problem: Clinical Measurements: Goal: Ability to maintain clinical measurements within normal limits will improve Outcome: Progressing Goal: Will remain free from infection Outcome: Progressing Goal: Diagnostic test results will improve Outcome: Progressing Goal: Respiratory complications will improve Outcome: Progressing Goal: Cardiovascular complication will  be avoided Outcome: Progressing   Problem: Activity: Goal: Risk for activity intolerance will decrease Outcome: Progressing   Problem: Nutrition: Goal: Adequate nutrition will be maintained Outcome: Progressing   Problem: Pain Managment: Goal: General experience of comfort will improve and/or be controlled Outcome: Progressing   Problem: Elimination: Goal: Will not experience complications related to bowel motility Outcome: Progressing Goal: Will not experience complications related to urinary retention Outcome: Progressing   Problem: Coping: Goal: Level of anxiety will decrease Outcome: Progressing   Problem: Safety: Goal: Ability to remain free from injury will improve Outcome: Progressing   Problem: Skin Integrity: Goal: Risk for impaired skin integrity will decrease Outcome: Progressing

## 2023-09-26 NOTE — Progress Notes (Signed)
 PROGRESS NOTE    Justin Salinas  ZOX:[id number] DOB: [birth year] DOA: 09/25/2023 PCP: Rodney Clamp, MD    Brief Narrative:   83-year with history of CKD stage IIIa, CAD, DM 2, chronic CHF with preserved EF, HTN, GERD, HLD admitted to the hospital for AKI and community-acquired pneumonia.  Patient resides in assisted living and has been feeling ill 2 days prior to admission therefore brought to the hospital.  Chest x-ray showed concerns of left lower lobe pneumonia versus atelectasis.  Assessment & Plan:  Principal Problem:   AKI (acute kidney injury) (HCC)     AKI superimposed on CKD stage III -Admission creatinine 3.27, baseline appears to be around 1.8.  Avoid nephrotoxic drugs, continue IV fluids and monitor urine output along with renal function.   Community-acquired pneumonia, left lower lobe -Chest x-ray has been reviewed.  Continue empiric Rocephin  and azithromycin .  Clinically monitor.  Bronchodilators, I-S/flutter valve.   Hypertension -IV as needed   Chronic heart failure with preserved EF History of CAD -Patient appears dry on exam and also has AKI therefore getting gentle hydration. - Echocardiogram in 2024 showed EF of 65%, grade 1 DD   Insulin -dependent type 2 diabetes -A1c-pending - Sliding scale and Accu-Cheks.  DVT prophylaxis:SQ Hep    Code Status: Full Code Family Communication: Daughter updated by me Status is: Inpatient Remains inpatient appropriate because: Ongoing management for AKI and community-acquired pneumonia.  Subjective: Sitting up in the chair.  Does not have any complaints as he is slowly improving.   Examination:  General exam: Appears calm and comfortable  Respiratory system: b/l rhonchi Cardiovascular system: S1 & S2 heard, RRR. No JVD, murmurs, rubs, gallops or clicks. No pedal edema. Gastrointestinal system: Abdomen is nondistended, soft and nontender. No organomegaly or masses felt. Normal bowel sounds heard. Central  nervous system: Alert and oriented. No focal neurological deficits. Extremities: Symmetric 5 x 5 power. Skin: No rashes, lesions or ulcers Psychiatry: Judgement and insight appear normal. Mood & affect appropriate.                Diet Orders (From admission, onward)     Start     Ordered   09/25/23 1028  Diet Carb Modified Fluid consistency: Thin; Room service appropriate? Yes  Diet effective now       Question Answer Comment  Diet-HS Snack? Nothing   Calorie Level Medium 1600-2000   Fluid consistency: Thin   Room service appropriate? Yes      09/25/23 1027            Objective: Vitals:   09/25/23 1241 09/25/23 1637 09/25/23 1953 09/26/23 0500  BP: 119/61 (!) 121/48 129/60 (!) 138/51  Pulse: 75 (!) 57 72 70  Resp: 18 16 18 18   Temp: 97.9 F (36.6 C) 98.1 F (36.7 C) 97.7 F (36.5 C) 98 F (36.7 C)  TempSrc: Oral Oral Oral Oral  SpO2: 91% 95% 94% 98%  Weight:      Height:        Intake/Output Summary (Last 24 hours) at 09/26/2023 1058 Last data filed at 09/26/2023 0929 Gross per 24 hour  Intake 1170.25 ml  Output 1650 ml  Net -479.75 ml   Filed Weights   09/25/23 0737  Weight: 79.4 kg    Scheduled Meds:  [START ON 09/27/2023] alfuzosin   10 mg Oral Q breakfast   arformoterol   15 mcg Nebulization BID   aspirin  EC  81 mg Oral Daily   carvedilol   12.5 mg Oral  BID WC   dextromethorphan -guaiFENesin   2 tablet Oral BID   finasteride   5 mg Oral Daily   heparin   5,000 Units Subcutaneous Q8H   insulin  aspart  0-15 Units Subcutaneous TID WC   insulin  aspart  0-5 Units Subcutaneous QHS   revefenacin   175 mcg Nebulization Daily   risperiDONE   0.5 mg Oral Q12H   sertraline   100 mg Oral Daily   Continuous Infusions:  sodium chloride  75 mL/hr at 09/26/23 0900   azithromycin  500 mg (09/26/23 0903)   cefTRIAXone  (ROCEPHIN )  IV 1 g (09/26/23 1046)    Nutritional status     Body mass index is 25.84 kg/m.  Data Reviewed:   CBC: Recent Labs  Lab  09/24/23 1804 09/25/23 0758 09/26/23 0930  WBC 7.9 8.6 8.2  NEUTROABS 4.8  --   --   HGB 10.7* 10.1* 10.6*  HCT 33.5* 31.2* 32.7*  MCV 104.0* 102.0* 102.2*  PLT 158 167 183   Basic Metabolic Panel: Recent Labs  Lab 09/24/23 1804 09/25/23 0758 09/26/23 0930  NA 136 134* 140  K 3.9 4.3 3.6  CL 106 105 108  CO2 20* 19* 23  GLUCOSE 166* 297* 180*  BUN 115* 132* 93*  CREATININE 3.27* 3.05* 2.31*  CALCIUM  9.1 8.8* 8.9   GFR: Estimated Creatinine Clearance: 23.4 mL/min (A) (by C-G formula based on SCr of 2.31 mg/dL (H)). Liver Function Tests: Recent Labs  Lab 09/25/23 0758  AST 19  ALT 16  ALKPHOS 62  BILITOT 0.9  PROT 7.0  ALBUMIN  3.3*   No results for input(s): "LIPASE", "AMYLASE" in the last 168 hours. No results for input(s): "AMMONIA" in the last 168 hours. Coagulation Profile: No results for input(s): "INR", "PROTIME" in the last 168 hours. Cardiac Enzymes: No results for input(s): "CKTOTAL", "CKMB", "CKMBINDEX", "TROPONINI" in the last 168 hours. BNP (last 3 results) No results for input(s): "PROBNP" in the last 8760 hours. HbA1C: No results for input(s): "HGBA1C" in the last 72 hours. CBG: Recent Labs  Lab 09/25/23 0653 09/25/23 1201 09/25/23 1635 09/25/23 2056 09/26/23 0737  GLUCAP 285* 283* 155* 164* 186*   Lipid Profile: No results for input(s): "CHOL", "HDL", "LDLCALC", "TRIG", "CHOLHDL", "LDLDIRECT" in the last 72 hours. Thyroid  Function Tests: No results for input(s): "TSH", "T4TOTAL", "FREET4", "T3FREE", "THYROIDAB" in the last 72 hours. Anemia Panel: No results for input(s): "VITAMINB12", "FOLATE", "FERRITIN", "TIBC", "IRON", "RETICCTPCT" in the last 72 hours. Sepsis Labs: Recent Labs  Lab 09/26/23 0932  PROCALCITON 0.25    Recent Results (from the past 240 hours)  Resp panel by RT-PCR (RSV, Flu A&B, Covid) Anterior Nasal Swab     Status: None   Collection Time: 09/25/23  7:58 AM   Specimen: Anterior Nasal Swab  Result Value Ref  Range Status   SARS Coronavirus 2 by RT PCR NEGATIVE NEGATIVE Final    Comment: (NOTE) SARS-CoV-2 target nucleic acids are NOT DETECTED.  The SARS-CoV-2 RNA is generally detectable in upper respiratory specimens during the acute phase of infection. The lowest concentration of SARS-CoV-2 viral copies this assay can detect is 138 copies/mL. A negative result does not preclude SARS-Cov-2 infection and should not be used as the sole basis for treatment or other patient management decisions. A negative result may occur with  improper specimen collection/handling, submission of specimen other than nasopharyngeal swab, presence of viral mutation(s) within the areas targeted by this assay, and inadequate number of viral copies(<138 copies/mL). A negative result must be combined with clinical observations, patient history,  and epidemiological information. The expected result is Negative.  Fact Sheet for Patients:  BloggerCourse.com  Fact Sheet for Healthcare Providers:  SeriousBroker.it  This test is no t yet approved or cleared by the [place]  FDA and  has been authorized for detection and/or diagnosis of SARS-CoV-2 by FDA under an Emergency Use Authorization (EUA). This EUA will remain  in effect (meaning this test can be used) for the duration of the COVID-19 declaration under Section 564(b)(1) of the Act, 21 U.S.C.section 360bbb-3(b)(1), unless the authorization is terminated  or revoked sooner.       Influenza A by PCR NEGATIVE NEGATIVE Final   Influenza B by PCR NEGATIVE NEGATIVE Final    Comment: (NOTE) The Xpert Xpress SARS-CoV-2/FLU/RSV plus assay is intended as an aid in the diagnosis of influenza from Nasopharyngeal swab specimens and should not be used as a sole basis for treatment. Nasal washings and aspirates are unacceptable for Xpert Xpress SARS-CoV-2/FLU/RSV testing.  Fact Sheet for  Patients: BloggerCourse.com  Fact Sheet for Healthcare Providers: SeriousBroker.it  This test is not yet approved or cleared by the [place]  FDA and has been authorized for detection and/or diagnosis of SARS-CoV-2 by FDA under an Emergency Use Authorization (EUA). This EUA will remain in effect (meaning this test can be used) for the duration of the COVID-19 declaration under Section 564(b)(1) of the Act, 21 U.S.C. section 360bbb-3(b)(1), unless the authorization is terminated or revoked.     Resp Syncytial Virus by PCR NEGATIVE NEGATIVE Final    Comment: (NOTE) Fact Sheet for Patients: BloggerCourse.com  Fact Sheet for Healthcare Providers: SeriousBroker.it  This test is not yet approved or cleared by the [place]  FDA and has been authorized for detection and/or diagnosis of SARS-CoV-2 by FDA under an Emergency Use Authorization (EUA). This EUA will remain in effect (meaning this test can be used) for the duration of the COVID-19 declaration under Section 564(b)(1) of the Act, 21 U.S.C. section 360bbb-3(b)(1), unless the authorization is terminated or revoked.  Performed at [facility], [address] [place], [place] [address]          Radiology Studies: DG Chest 2 View Result Date: 09/24/2023 CLINICAL DATA:  Fatigue and cough over the last few days EXAM: CHEST - 2 VIEW COMPARISON:  07/16/2022 FINDINGS: Sternotomy and CABG. Perihilar and peribronchial thickening. Left lower lobe atelectasis or pneumonia. No pneumothorax or pleural effusion. Normal cardiomediastinal silhouette. No acute bone abnormality. IMPRESSION: Bronchitis with left lower lobe atelectasis or pneumonia. Electronically Signed   By: Rozell Cornet M.D.   On: 09/24/2023 18:20           LOS: 1 day   Time spent= 35 mins    Maggie Schooner, MD [facility]  If  7PM-7AM, please contact night-coverage  09/26/2023, 10:58 AM

## 2023-09-26 NOTE — Hospital Course (Addendum)
 Brief Narrative:   83-year with history of CKD stage IIIa, CAD, DM 2, chronic CHF with preserved EF, HTN, GERD, HLD admitted to the hospital for AKI and community-acquired pneumonia.  Patient resides in assisted living and has been feeling ill 2 days prior to admission therefore brought to the hospital.  Chest x-ray showed concerns of left lower lobe pneumonia versus atelectasis.  Assessment & Plan:  Principal Problem:   AKI (acute kidney injury) (HCC)     AKI superimposed on CKD stage III -Admission creatinine 3.27, baseline appears to be around 1.8.  Avoid nephrotoxic drugs, continue IV fluids and monitor urine output along with renal function.   Community-acquired pneumonia, left lower lobe -Chest x-ray has been reviewed.  Continue empiric Rocephin  and azithromycin .  Clinically monitor.  Bronchodilators, I-S/flutter valve.   Hypertension -IV as needed   Chronic heart failure with preserved EF History of CAD -Patient appears dry on exam and also has AKI therefore getting gentle hydration. - Echocardiogram in 2024 showed EF of 65%, grade 1 DD   Insulin -dependent type 2 diabetes -A1c-pending - Sliding scale and Accu-Cheks.  DVT prophylaxis:SQ Hep    Code Status: Full Code Family Communication: Daughter updated by me Status is: Inpatient Remains inpatient appropriate because: Ongoing management for AKI and community-acquired pneumonia.  Subjective: Sitting up in the chair.  Does not have any complaints as he is slowly improving.   Examination:  General exam: Appears calm and comfortable  Respiratory system: b/l rhonchi Cardiovascular system: S1 & S2 heard, RRR. No JVD, murmurs, rubs, gallops or clicks. No pedal edema. Gastrointestinal system: Abdomen is nondistended, soft and nontender. No organomegaly or masses felt. Normal bowel sounds heard. Central nervous system: Alert and oriented. No focal neurological deficits. Extremities: Symmetric 5 x 5 power. Skin: No rashes,  lesions or ulcers Psychiatry: Judgement and insight appear normal. Mood & affect appropriate.

## 2023-09-26 NOTE — Plan of Care (Signed)

## 2023-09-26 NOTE — Evaluation (Signed)
 Occupational Therapy Evaluation Patient Details Name: Justin Salinas MRN: [id number] DOB: 1941-04-11 Today's Date: 09/26/2023   History of Present Illness   83-year admitted to the hospital for AKI and community-acquired pneumonia.with history of CKD stage IIIa, CAD, DM 2, chronic CHF with preserved EF, HTN, GERD, HLD     Clinical Impressions PTA, patient was living at [facility]F and reports independence with BADL's and mobility within facility with assistance for all IADL's. Currently, patient presents with deficits outlined below (see OT Problem list for details) most significantly decreased activity tolerance, balance and cognitive/safety skills which impact ADL's and mobility. Patient requires continued Acute care skilled OT to progress function and anticipate HHOT in ALF upon discharge with caregiver assistance and support.      If plan is discharge home, recommend the following:   A little help with walking and/or transfers;A little help with bathing/dressing/bathroom;Assistance with cooking/housework;Direct supervision/assist for medications management;Direct supervision/assist for financial management;Assist for transportation;Help with stairs or ramp for entrance;Supervision due to cognitive status     Functional Status Assessment   Patient has had a recent decline in their functional status and demonstrates the ability to make significant improvements in function in a reasonable and predictable amount of time.     Equipment Recommendations   None recommended by OT      Precautions/Restrictions   Precautions Precautions: Fall Recall of Precautions/Restrictions: Impaired Restrictions Weight Bearing Restrictions Per Provider Order: No     Mobility Bed Mobility               General bed mobility comments: up in recliner    Transfers Overall transfer level: Needs assistance Equipment used: Rolling walker (2 wheels) Transfers: Sit to/from Stand,  Bed to chair/wheelchair/BSC Sit to Stand: Min assist     Step pivot transfers: Min assist     General transfer comment: VCs hand placement for recliner and grab bar      Balance Overall balance assessment: Needs assistance Sitting-balance support: Feet supported, No upper extremity supported Sitting balance-Leahy Scale: Good     Standing balance support: Bilateral upper extremity supported, During functional activity Standing balance-Leahy Scale: Poor                             ADL either performed or assessed with clinical judgement   ADL Overall ADL's : Needs assistance/impaired Eating/Feeding: Independent;Sitting   Grooming: Wash/dry face;Wash/dry hands;Oral care;Brushing hair;Set up;Sitting   Upper Body Bathing: Set up;Sitting   Lower Body Bathing: Contact guard assist;Sitting/lateral leans;Sit to/from stand   Upper Body Dressing : Contact guard assist;Sitting   Lower Body Dressing: Minimal assistance;Sitting/lateral leans;Sit to/from stand   Toilet Transfer: Minimal assistance;Rolling walker (2 wheels);Grab bars   Toileting- Clothing Manipulation and Hygiene: Minimal assistance       Functional mobility during ADLs: Minimal assistance General ADL Comments: cues for sequencing and safety     Vision Baseline Vision/History: 1 Wears glasses;0 No visual deficits Ability to See in Adequate Light: 0 Adequate Patient Visual Report: No change from baseline Vision Assessment?: No apparent visual deficits;Wears glasses for reading     Perception Perception: Within Functional Limits       Praxis Praxis: WFL (basic functional tasks)       Pertinent Vitals/Pain Pain Assessment Pain Assessment: No/denies pain     Extremity/Trunk Assessment Upper Extremity Assessment Upper Extremity Assessment: Overall WFL for tasks assessed;Right hand dominant   Lower Extremity Assessment Lower Extremity Assessment: Overall WFL for  tasks assessed   Cervical /  Trunk Assessment Cervical / Trunk Assessment: Kyphotic   Communication Communication Communication: Impaired Factors Affecting Communication: Hearing impaired   Cognition Arousal: Alert Behavior During Therapy: WFL for tasks assessed/performed Cognition: History of cognitive impairments             OT - Cognition Comments: patient Ox self and situation, min cues for place and time, OT observed patient on phone with son out of state, mostly accurate conversation, HOH makes repetition of information needed, decreased prolblem solving and safety                 Following commands: Intact       Cueing  General Comments   Cueing Techniques: Verbal cues  on RA SpO2 96%           Home Living Family/patient expects to be discharged to:: Assisted living                             Home Equipment: Jeananne Mighty - single point;Shower seat;Hand held shower head;Grab bars - toilet;Grab bars - tub/shower   Additional Comments: has assistance for all IADL's      Prior Functioning/Environment Prior Level of Function : Patient poor historian/Family not available             Mobility Comments: pt reports [name], however he is only oriented to self/situation ADLs Comments: reports modified indep with shower chair for BADL's    OT Problem List: Decreased activity tolerance;Impaired balance (sitting and/or standing);Decreased cognition;Decreased safety awareness;Decreased knowledge of precautions;Decreased knowledge of use of DME or AE;Cardiopulmonary status limiting activity   OT Treatment/Interventions: Self-care/ADL training;Therapeutic exercise;Energy conservation;DME and/or AE instruction;Therapeutic activities;Cognitive remediation/compensation;Patient/family education;Balance training      OT Goals(Current goals can be found in the care plan section)   Acute Rehab OT Goals Patient Stated Goal: to go back to [facility] OT Goal Formulation: With  patient Time For Goal Achievement: 10/10/23 Potential to Achieve Goals: Good ADL Goals Pt Will Perform Lower Body Bathing: with supervision;sitting/lateral leans;sit to/from stand Pt Will Perform Lower Body Dressing: with supervision;sitting/lateral leans;sit to/from stand Pt Will Transfer to Toilet: with supervision;ambulating;grab bars Pt/caregiver will Perform Home Exercise Program: Increased strength;With Supervision;With written HEP provided   OT Frequency:  Min 2X/week       AM-PAC OT "6 Clicks" Daily Activity     Outcome Measure Help from another person eating meals?: None Help from another person taking care of personal grooming?: A Little Help from another person toileting, which includes using toliet, bedpan, or urinal?: A Little Help from another person bathing (including washing, rinsing, drying)?: A Little Help from another person to put on and taking off regular upper body clothing?: A Little Help from another person to put on and taking off regular lower body clothing?: A Little 6 Click Score: 19   End of Session Equipment Utilized During Treatment: Gait belt;Rolling walker (2 wheels) Nurse Communication: Mobility status  Activity Tolerance: Patient tolerated treatment well Patient left: in chair;with chair alarm set;with nursing/sitter in room;with call bell/phone within reach  OT Visit Diagnosis: Unsteadiness on feet (R26.81);Cognitive communication deficit (R41.841)                Time: 3086-5784 OT Time Calculation (min): 29 min Charges:  OT General Charges $OT Visit: 1 Visit OT Evaluation $OT Eval Moderate Complexity: 1 Mod OT Treatments $Self Care/Home Management : 8-22 mins  Keyuna Cuthrell OT/L  Acute Rehabilitation Department  [phone]  09/26/2023, 4:24 PM

## 2023-09-26 NOTE — TOC Initial Note (Addendum)
 Transition of Care [facility]) - Initial/Assessment Note    Patient Details  Name: Justin Salinas MRN: [id number] Date of Birth: Mar 10, 1941  Transition of Care [facility]) CM/SW Contact:    Kathryn Parish, RN Phone Number: 09/26/2023, 3:30 PM  Clinical Narrative:            From [facility].DME (cane , rollator, and shower chair). Discussed with patient that PT has recommendation for [facility] PT. Patient is accepting of HH. No further needs identified during the visit. Pt daughter will transport at discharge.Case Manager will continue to follow.  Spoke with [facility] PT uses [facility] for PT services  Expected Discharge Plan: Skilled Nursing Facility Barriers to Discharge: Continued Medical Work up   Patient Goals and CMS Choice Patient states their goals for this hospitalization and ongoing recovery are:: Return to [employer]          Expected Discharge Plan and Services   Discharge Planning Services: CM Consult   Living arrangements for the past 2 months: Assisted Living Facility                 DME Arranged: N/A DME Agency: NA                  Prior Living Arrangements/Services Living arrangements for the past 2 months: Assisted Living Facility Lives with:: Facility Resident Patient language and need for interpreter reviewed:: Yes Do you feel safe going back to the place where you live?: Yes      Need for Family Participation in Patient Care: Yes (Comment) Care giver support system in place?: Yes (comment)   Criminal Activity/Legal Involvement Pertinent to Current Situation/Hospitalization: No - Comment as needed  Activities of Daily Living   ADL Screening (condition at time of admission) Independently performs ADLs?: No Does the patient have a NEW difficulty with bathing/dressing/toileting/self-feeding that is expected to last >3 days?: No Does the patient have a NEW difficulty with getting in/out of bed, walking, or climbing stairs that is  expected to last >3 days?: No Does the patient have a NEW difficulty with communication that is expected to last >3 days?: No Is the patient deaf or have difficulty hearing?: Yes Does the patient have difficulty seeing, even when wearing glasses/contacts?: No Does the patient have difficulty concentrating, remembering, or making decisions?: Yes  Permission Sought/Granted Permission sought to share information with : Case Manager Permission granted to share information with : Yes, Verbal Permission Granted  Share Information with NAME: [facility]  Permission granted to share info w AGENCY: Yes        Emotional Assessment Appearance:: Appears stated age     Orientation: : Oriented to Self, Oriented to Place, Oriented to  Time, Oriented to Situation Alcohol / Substance Use: Not Applicable Psych Involvement: No (comment)  Admission diagnosis:  AKI (acute kidney injury) (HCC) [N17.9] Community acquired pneumonia, unspecified laterality [J18.9] Patient Active Problem List   Diagnosis Date Noted   AKI (acute kidney injury) (HCC) 09/25/2023   Hyperglycemia 07/24/2022   Recurrent falls 07/23/2022   Cognitive decline 07/23/2022   Lumbar radiculopathy 09/23/2020   Atherosclerosis of abdominal aorta (HCC) 02/26/2020   Status post placement of bone anchored hearing aid (BAHA) 05/09/2019   Asymmetric SNHL (sensorineural hearing loss) 04/02/2019   Iron deficiency anemia 10/22/2017   Type 2 diabetes mellitus with hyperglycemia (HCC) 01/03/2017   PVD (posterior vitreous detachment), left eye 02/16/2016   Vitreous syneresis of right eye 02/16/2016   Diastolic dysfunction  Hypertension associated with diabetes (HCC)    CKD stage 3 due to type 2 diabetes mellitus (HCC)    S/P CABG x 4 05/21/2014   Bilateral dry eyes 01/20/2014   Nuclear cataract, bilateral 12/10/2013   Astigmatism 01/11/2012   Hypermetropia 01/11/2012   Presbyopia 01/11/2012   Carotid artery disease (HCC)  12/01/2007   BPH (benign prostatic hyperplasia) 12/01/2007   Dyslipidemia associated with type 2 diabetes mellitus (HCC) 11/28/2007   CAD (coronary artery disease) 11/28/2007   GERD 11/28/2007   PCP:  Rodney Clamp, MD Pharmacy:   [facility] - [place], [place] - [address] [place] [address] Phone: [phone] Fax: [phone]     Social Drivers of Health (SDOH) Social History: SDOH Screenings   Food Insecurity: No Food Insecurity (09/25/2023)  Housing: Low Risk  (09/25/2023)  Transportation Needs: No Transportation Needs (09/25/2023)  Utilities: Not At Risk (09/25/2023)  Depression (PHQ2-9): Low Risk  (12/11/2022)  Financial Resource Strain: Low Risk  (12/11/2022)  Physical Activity: Insufficiently Active (12/11/2022)  Social Connections: Moderately Isolated (09/25/2023)  Stress: No Stress Concern Present (12/11/2022)  Tobacco Use: High Risk (09/24/2023)   SDOH Interventions: Food Insecurity Interventions: Intervention Not Indicated Housing Interventions: Intervention Not Indicated Transportation Interventions: Intervention Not Indicated Utilities Interventions: Intervention Not Indicated Social Connections Interventions: Intervention Not Indicated   Readmission Risk Interventions     No data to display

## 2023-09-26 NOTE — Evaluation (Signed)
 Physical Therapy Evaluation Patient Details Name: Justin Salinas MRN: [id number] DOB: Oct 01, 1940 Today's Date: 09/26/2023  History of Present Illness  83-year admitted to the hospital for AKI and community-acquired pneumonia.with history of CKD stage IIIa, CAD, DM 2, chronic CHF with preserved EF, HTN, GERD, HLD  Clinical Impression  Pt admitted with above diagnosis. Pt ambulated 22' with RW with contact guard assist, no loss of balance, SpO2 94% on room air. Ambulation distance limited by pt needing to use the bathroom. Pt oriented to self only, he stated current year is 92. Pt stated he walks without an assistive device at baseline and denies h/o falls, however he is not a reliable historian. Per chart pt is from ALF.  Pt currently with functional limitations due to the deficits listed below (see PT Problem List). Pt will benefit from acute skilled PT to increase their independence and safety with mobility to allow discharge.           If plan is discharge home, recommend the following: A little help with walking and/or transfers;A little help with bathing/dressing/bathroom;Assistance with cooking/housework;Assist for transportation;Help with stairs or ramp for entrance   Can travel by private vehicle        Equipment Recommendations Rolling walker (2 wheels)  Recommendations for Other Services       Functional Status Assessment Patient has had a recent decline in their functional status and demonstrates the ability to make significant improvements in function in a reasonable and predictable amount of time.     Precautions / Restrictions Precautions Precautions: Fall Recall of Precautions/Restrictions: Impaired Precaution/Restrictions Comments: pt oriented to self only Restrictions Weight Bearing Restrictions Per Provider Order: No      Mobility  Bed Mobility               General bed mobility comments: up in recliner    Transfers Overall transfer level: Needs  assistance Equipment used: Rolling walker (2 wheels) Transfers: Sit to/from Stand Sit to Stand: Min assist           General transfer comment: min A to power up, VCs hand placement    Ambulation/Gait Ambulation/Gait assistance: Contact guard assist Gait Distance (Feet): 36 Feet Assistive device: Rolling walker (2 wheels) Gait Pattern/deviations: Step-through pattern, Trunk flexed Gait velocity: WFL     General Gait Details: steady, no loss of balance, SpO2 94% on room air walking; distance limited by pt needing to go to the bathroom  Stairs            Wheelchair Mobility     Tilt Bed    Modified Rankin (Stroke Patients Only)       Balance Overall balance assessment: Needs assistance Sitting-balance support: Feet supported, No upper extremity supported Sitting balance-Leahy Scale: Good     Standing balance support: Bilateral upper extremity supported, During functional activity, Reliant on assistive device for balance Standing balance-Leahy Scale: Poor                               Pertinent Vitals/Pain Pain Assessment Pain Assessment: Faces Faces Pain Scale: No hurt    Home Living Family/patient expects to be discharged to:: Assisted living                        Prior Function Prior Level of Function : Patient poor historian/Family not available             Mobility  Comments: pt reports [name], however he is only oriented to self       Extremity/Trunk Assessment   Upper Extremity Assessment Upper Extremity Assessment: Overall WFL for tasks assessed    Lower Extremity Assessment Lower Extremity Assessment: Overall WFL for tasks assessed    Cervical / Trunk Assessment Cervical / Trunk Assessment: Kyphotic  Communication   Communication Communication: Impaired Factors Affecting Communication: Hearing impaired    Cognition Arousal: Alert Behavior During Therapy: WFL for tasks assessed/performed    PT - Cognitive impairments: No family/caregiver present to determine baseline, Orientation   Orientation impairments: Place, Time, Situation                   PT - Cognition Comments: oriented to self only, stated current year is "106", did not know current location Following commands: Intact       Cueing Cueing Techniques: Verbal cues     General Comments      Exercises     Assessment/Plan    PT Assessment Patient needs continued PT services  PT Problem List Decreased activity tolerance;Decreased balance       PT Treatment Interventions Gait training;Therapeutic exercise;Therapeutic activities;DME instruction;Functional mobility training;Patient/family education    PT Goals (Current goals can be found in the Care Plan section)  Acute Rehab PT Goals PT Goal Formulation: Patient unable to participate in goal setting Time For Goal Achievement: 10/10/23 Potential to Achieve Goals: Good    Frequency Min 3X/week     Co-evaluation               AM-PAC PT "6 Clicks" Mobility  Outcome Measure Help needed turning from your back to your side while in a flat bed without using bedrails?: A Little Help needed moving from lying on your back to sitting on the side of a flat bed without using bedrails?: A Little Help needed moving to and from a bed to a chair (including a wheelchair)?: A Little Help needed standing up from a chair using your arms (e.g., wheelchair or bedside chair)?: A Little Help needed to walk in hospital room?: A Little Help needed climbing 3-5 steps with a railing? : A Lot 6 Click Score: 17    End of Session Equipment Utilized During Treatment: Gait belt Activity Tolerance: Patient tolerated treatment well Patient left: in chair;with chair alarm set;with call bell/phone within reach Nurse Communication: Mobility status PT Visit Diagnosis: Difficulty in walking, not elsewhere classified (R26.2)    Time: 9604-5409 PT Time Calculation (min)  (ACUTE ONLY): 16 min   Charges:   PT Evaluation $PT Eval Moderate Complexity: 1 Mod   PT General Charges $$ ACUTE PT VISIT: 1 Visit         Daymon Evans PT 09/26/2023  Acute Rehabilitation Services  Office [phone]

## 2023-09-27 ENCOUNTER — Encounter (HOSPITAL_COMMUNITY): Payer: Self-pay | Admitting: Internal Medicine

## 2023-09-27 DIAGNOSIS — N179 Acute kidney failure, unspecified: Secondary | ICD-10-CM | POA: Diagnosis not present

## 2023-09-27 LAB — CBC
HCT: 33.2 % — ABNORMAL LOW (ref 39.0–52.0)
Hemoglobin: 10.6 g/dL — ABNORMAL LOW (ref 13.0–17.0)
MCH: 32.6 pg (ref 26.0–34.0)
MCHC: 31.9 g/dL (ref 30.0–36.0)
MCV: 102.2 fL — ABNORMAL HIGH (ref 80.0–100.0)
Platelets: 207 10*3/uL (ref 150–400)
RBC: 3.25 MIL/uL — ABNORMAL LOW (ref 4.22–5.81)
RDW: 12.3 % (ref 11.5–15.5)
WBC: 11.4 10*3/uL — ABNORMAL HIGH (ref 4.0–10.5)
nRBC: 0 % (ref 0.0–0.2)

## 2023-09-27 LAB — BASIC METABOLIC PANEL WITH GFR
Anion gap: 12 (ref 5–15)
BUN: 67 mg/dL — ABNORMAL HIGH (ref 8–23)
CO2: 17 mmol/L — ABNORMAL LOW (ref 22–32)
Calcium: 8.5 mg/dL — ABNORMAL LOW (ref 8.9–10.3)
Chloride: 109 mmol/L (ref 98–111)
Creatinine, Ser: 1.86 mg/dL — ABNORMAL HIGH (ref 0.61–1.24)
GFR, Estimated: 35 mL/min — ABNORMAL LOW (ref >60)
Glucose, Bld: 250 mg/dL — ABNORMAL HIGH (ref 70–99)
Potassium: 4.1 mmol/L (ref 3.5–5.1)
Sodium: 138 mmol/L (ref 135–145)

## 2023-09-27 LAB — GLUCOSE, CAPILLARY
Glucose-Capillary: 197 mg/dL — ABNORMAL HIGH (ref 70–99)
Glucose-Capillary: 252 mg/dL — ABNORMAL HIGH (ref 70–99)

## 2023-09-27 LAB — MAGNESIUM: Magnesium: 2.2 mg/dL (ref 1.7–2.4)

## 2023-09-27 LAB — PHOSPHORUS: Phosphorus: 2.7 mg/dL (ref 2.5–4.6)

## 2023-09-27 MED ORDER — FUROSEMIDE 40 MG PO TABS: 40.0000 mg | ORAL_TABLET | Freq: Two times a day (BID) | ORAL | Status: DC

## 2023-09-27 MED ORDER — LORAZEPAM 0.5 MG PO TABS: 0.5000 mg | ORAL_TABLET | Freq: Two times a day (BID) | ORAL | 0 refills | Status: DC | PRN

## 2023-09-27 MED ORDER — RISPERIDONE 0.5 MG PO TABS: 0.5000 mg | ORAL_TABLET | Freq: Two times a day (BID) | ORAL | 0 refills | Status: DC

## 2023-09-27 MED ORDER — EMPAGLIFLOZIN 10 MG PO TABS: 10.0000 mg | ORAL_TABLET | Freq: Every day | ORAL | Status: DC

## 2023-09-27 MED ORDER — SODIUM CHLORIDE 0.9 % IV BOLUS
500.0000 mL | Freq: Once | INTRAVENOUS | Status: AC
  Administered 2023-09-27: 500 mL via INTRAVENOUS

## 2023-09-27 MED ORDER — AMOXICILLIN-POT CLAVULANATE 875-125 MG PO TABS
1.0000 | ORAL_TABLET | Freq: Two times a day (BID) | ORAL | Status: AC
Start: 2023-09-27 — End: 2023-10-02

## 2023-09-27 MED ORDER — AZITHROMYCIN 250 MG PO TABS
500.0000 mg | ORAL_TABLET | Freq: Every day | ORAL | Status: DC
  Administered 2023-09-27: 500 mg via ORAL
  Filled 2023-09-27: qty 2

## 2023-09-27 MED ORDER — OLANZAPINE 10 MG IM SOLR: 2.5000 mg | Freq: Four times a day (QID) | INTRAMUSCULAR | Status: DC | PRN

## 2023-09-27 MED ORDER — OLMESARTAN MEDOXOMIL 20 MG PO TABS: 20.0000 mg | ORAL_TABLET | Freq: Every day | ORAL | Status: DC

## 2023-09-27 MED ORDER — AZITHROMYCIN 250 MG PO TABS: 500.0000 mg | ORAL_TABLET | Freq: Every day | ORAL | Status: AC

## 2023-09-27 NOTE — Progress Notes (Signed)
 Physical Therapy Treatment Patient Details Name: Justin Salinas MRN: [id number] DOB: Jun 21, 1940 Today's Date: 09/27/2023   History of Present Illness 83-year admitted to the hospital for AKI and community-acquired pneumonia.with history of CKD stage IIIa, CAD, DM 2, chronic CHF with preserved EF, HTN, GERD, HLD    PT Comments  Pt tolerated increased activity level this session, he ambulated 180' with RW with min assist for balance 2* posterior lean. Pt is pleasantly confused, he's oriented to self only, requires verbal/tactile cues to follow commands.     If plan is discharge home, recommend the following: A little help with walking and/or transfers;A little help with bathing/dressing/bathroom;Assistance with cooking/housework;Assist for transportation;Help with stairs or ramp for entrance   Can travel by private vehicle        Equipment Recommendations  Rolling walker (2 wheels)    Recommendations for Other Services       Precautions / Restrictions Precautions Precautions: Fall Recall of Precautions/Restrictions: Impaired Precaution/Restrictions Comments: pt oriented to self only Restrictions Weight Bearing Restrictions Per Provider Order: No     Mobility  Bed Mobility Overal bed mobility: Needs Assistance Bed Mobility: Supine to Sit     Supine to sit: Min assist     General bed mobility comments: min A to raise trunk, posterior lean in sitting when feet weren't supported    Transfers Overall transfer level: Needs assistance Equipment used: Rolling walker (2 wheels) Transfers: Sit to/from Stand, Bed to chair/wheelchair/BSC Sit to Stand: Min assist           General transfer comment: VCs hand placement, min A to power up and to steady 2* posterior lean    Ambulation/Gait Ambulation/Gait assistance: Min assist Gait Distance (Feet): 180 Feet Assistive device: Rolling walker (2 wheels) Gait Pattern/deviations: Step-through pattern, Trunk flexed Gait  velocity: WFL     General Gait Details: min A for posterior lean intermittently, VCs for proximity to RW, no dyspnea   Stairs             Wheelchair Mobility     Tilt Bed    Modified Rankin (Stroke Patients Only)       Balance Overall balance assessment: Needs assistance Sitting-balance support: Feet supported, No upper extremity supported Sitting balance-Leahy Scale: Poor Sitting balance - Comments: posterior lean when feet were unsupported Postural control: Posterior lean Standing balance support: Bilateral upper extremity supported, During functional activity, Reliant on assistive device for balance Standing balance-Leahy Scale: Poor Standing balance comment: min A for posterior lean                            Communication Communication Communication: Impaired Factors Affecting Communication: Hearing impaired  Cognition Arousal: Alert Behavior During Therapy: WFL for tasks assessed/performed   PT - Cognitive impairments: No family/caregiver present to determine baseline, Orientation   Orientation impairments: Place, Time, Situation                   PT - Cognition Comments: oriented to self only, did not know current location, repeats same question many times Following commands: Impaired Following commands impaired: Follows one step commands with increased time    Cueing Cueing Techniques: Verbal cues, Tactile cues  Exercises      General Comments        Pertinent Vitals/Pain Pain Assessment Faces Pain Scale: No hurt    Home Living  Prior Function            PT Goals (current goals can now be found in the care plan section) Acute Rehab PT Goals PT Goal Formulation: Patient unable to participate in goal setting Time For Goal Achievement: 10/10/23 Potential to Achieve Goals: Good Progress towards PT goals: Progressing toward goals    Frequency    Min 3X/week      PT Plan       Co-evaluation              AM-PAC PT "6 Clicks" Mobility   Outcome Measure  Help needed turning from your back to your side while in a flat bed without using bedrails?: A Little Help needed moving from lying on your back to sitting on the side of a flat bed without using bedrails?: A Little Help needed moving to and from a bed to a chair (including a wheelchair)?: A Little Help needed standing up from a chair using your arms (e.g., wheelchair or bedside chair)?: A Little Help needed to walk in hospital room?: A Little Help needed climbing 3-5 steps with a railing? : A Lot 6 Click Score: 17    End of Session Equipment Utilized During Treatment: Gait belt Activity Tolerance: Patient tolerated treatment well Patient left: in chair;with chair alarm set;with call bell/phone within reach Nurse Communication: Mobility status PT Visit Diagnosis: Difficulty in walking, not elsewhere classified (R26.2)     Time: 0865-7846 PT Time Calculation (min) (ACUTE ONLY): 20 min  Charges:    $Gait Training: 8-22 mins PT General Charges $$ ACUTE PT VISIT: 1 Visit                     Daymon Evans PT 09/27/2023  Acute Rehabilitation Services  Office [phone]

## 2023-09-27 NOTE — Discharge Summary (Signed)
 Physician Discharge Summary  Justin Salinas UJW:[id number] DOB: 02-25-1941 DOA: 09/25/2023  PCP: Rodney Clamp, MD  Admit date: 09/25/2023 Discharge date: 09/27/2023  Admitted From: Long-term care Disposition: Long-term care  Recommendations for Outpatient Follow-up:  Follow up with PCP in 1-2 weeks Please obtain BMP/CBC in 3-5 days Continue Augmentin  for 5 more days and azithromycin  for 1 day For now I have advised to hold Jardiance , Lasix  and Benicar for next 4 days and depending on renal function, this can be resumed   Discharge Condition: Stable CODE STATUS: Full code Diet recommendation: Diabetic  Brief/Interim Summary: Brief Narrative:   83-year with history of CKD stage IIIa, CAD, DM 2, chronic CHF with preserved EF, HTN, GERD, HLD admitted to the hospital for AKI and community-acquired pneumonia.  Patient resides in assisted living and has been feeling ill 2 days prior to admission therefore brought to the hospital.  Chest x-ray showed concerns of left lower lobe pneumonia versus atelectasis.  During the hospitalization patient was treated with IV fluids for his AKI which resolved on the day of discharge.  Also received Rocephin  and azithromycin .  Overall he started doing better.  Off-and-on he did have behavioral disturbances related to his dementia and delirium in the hospital which daughter was aware of. Today because he is doing much better daughter wishes to take him back to his facility today  Assessment & Plan:  Principal Problem:   AKI (acute kidney injury) (HCC)     AKI superimposed on CKD stage III -Admission creatinine 3.27, baseline appears to be around 1.8.  Avoid nephrotoxic drugs, continue IV fluids and monitor urine output along with renal function. Cr 2.31.    Community-acquired pneumonia, left lower lobe -Chest x-ray has been reviewed.  Continue empiric Rocephin  and azithromycin .  Clinically monitor.  Bronchodilators, I-S/flutter valve.    Hypertension -IV as needed   Chronic heart failure with preserved EF History of CAD -Patient appears dry on exam and also has AKI therefore getting gentle hydration. - Echocardiogram in 2024 showed EF of 65%, grade 1 DD   Insulin -dependent type 2 diabetes -A1c-7.0 - Sliding scale and Accu-Cheks.  Dementia with Behavorial disturbances -very paranoid, supportive care.   PT/OT = HH, F2F completed.   DVT prophylaxis:SQ Hep    Code Status: Full Code Family Communication: Daughter updated by me at bedside Status is: Inpatient Remains inpatient appropriate because: Discharge Subjective:  Doing well no complaints.  Still pleasantly confused   Examination:  General exam: Appears calm and comfortable  Respiratory system: mild rhonchi.  Cardiovascular system: S1 & S2 heard, RRR. No JVD, murmurs, rubs, gallops or clicks. No pedal edema. Gastrointestinal system: Abdomen is nondistended, soft and nontender. No organomegaly or masses felt. Normal bowel sounds heard. Central nervous system: Alert and oriented. No focal neurological deficits. Extremities: Symmetric 5 x 5 power. Skin: No rashes, lesions or ulcers Psychiatry: Judgement and insight appear poor. Very agitated.     Discharge Diagnoses:  Principal Problem:   AKI (acute kidney injury) [facility])      Discharge Exam: Vitals:   09/27/23 0507 09/27/23 1355  BP: (!) 145/70 136/68  Pulse: 70 71  Resp: 18 18  Temp: 98.2 F (36.8 C) 98.5 F (36.9 C)  SpO2: 98% 99%   Vitals:   09/26/23 1921 09/26/23 1955 09/27/23 0507 09/27/23 1355  BP:  (!) 147/70 (!) 145/70 136/68  Pulse:  69 70 71  Resp:  18 18 18   Temp:  (!) 97.4 F (36.3 C) 98.2 F (  36.8 C) 98.5 F (36.9 C)  TempSrc:  Oral Oral Oral  SpO2: 94% 94% 98% 99%  Weight:      Height:          Discharge Instructions   Allergies as of 09/27/2023       Reactions   Exenatide Other (See Comments)   Exenatide, sold under the brand name Byetta among others, is a  medication used to treat type 2 diabetes.   Linagliptin  Other (See Comments)   Tradjenta  (linagliptin ) is a brand-name tablet prescribed for type 2 diabetes.   Lasix  [furosemide ] Other (See Comments)   Constipation        Medication List     TAKE these medications    acetaminophen  325 MG tablet Commonly known as: TYLENOL  Take 650 mg by mouth every 6 (six) hours as needed for moderate pain (pain score 4-6).   alfuzosin  10 MG 24 hr tablet Commonly known as: UROXATRAL  Take 10 mg by mouth daily with breakfast.   amLODipine  10 MG tablet Commonly known as: NORVASC  TAKE 1 TABLET EVERY DAY   amoxicillin -clavulanate 875-125 MG tablet Commonly known as: AUGMENTIN  Take 1 tablet by mouth 2 (two) times daily for 5 days.   antiseptic oral rinse Liqd 15 mLs by Mouth Rinse route 4 (four) times daily as needed for dry mouth.   aspirin  81 MG tablet Take 81 mg by mouth daily.   azithromycin  250 MG tablet Commonly known as: ZITHROMAX  Take 2 tablets (500 mg total) by mouth daily for 1 day.   carvedilol  12.5 MG tablet Commonly known as: COREG  Take 1 tablet (12.5 mg total) by mouth 2 (two) times daily with a meal. What changed:  how much to take additional instructions   cloNIDine 0.2 mg/24hr patch Commonly known as: CATAPRES - Dosed in mg/24 hr Place 0.2 mg onto the skin once a week.   empagliflozin  10 MG Tabs tablet Commonly known as: Jardiance  Take 1 tablet (10 mg total) by mouth daily before breakfast. Start taking on: October 01, 2023 What changed: These instructions start on October 01, 2023. If you are unsure what to do until then, ask your doctor or other care provider.   erythromycin ophthalmic ointment Place 1 Application into both eyes in the morning, at noon, in the evening, and at bedtime. 7 Days   finasteride  5 MG tablet Commonly known as: PROSCAR  Take 5 mg by mouth daily.   fluticasone 50 MCG/ACT nasal spray Commonly known as: FLONASE Place 1 spray into both  nostrils daily.   furosemide  40 MG tablet Commonly known as: LASIX  Take 1 tablet (40 mg total) by mouth 2 (two) times daily. Start taking on: October 01, 2023 What changed: These instructions start on October 01, 2023. If you are unsure what to do until then, ask your doctor or other care provider.   guaiFENesin  600 MG 12 hr tablet Commonly known as: MUCINEX  Take 600 mg by mouth 2 (two) times daily.   HumaLOG KwikPen 100 UNIT/ML KwikPen Generic drug: insulin  lispro Inject 0-10 Units into the skin 3 (three) times daily. Sliding Scale: If BS<70 Call MD, IF BS is 71-150=0 units, 151-200=2 units, 201-250=4 units, 251-300=6 units, 301-350=8 units, 351-400=10 units, BS>400 Call MD   Lantus SoloStar 100 UNIT/ML Solostar Pen Generic drug: insulin  glargine Inject 8 Units into the skin at bedtime.   loratadine 10 MG tablet Commonly known as: CLARITIN Take 10 mg by mouth daily.   LORazepam  0.5 MG tablet Commonly known as: ATIVAN  Take 1 tablet (  0.5 mg total) by mouth every 12 (twelve) hours as needed.   olmesartan 20 MG tablet Commonly known as: BENICAR Take 1 tablet (20 mg total) by mouth daily. Start taking on: October 01, 2023 What changed: These instructions start on October 01, 2023. If you are unsure what to do until then, ask your doctor or other care provider.   polyethylene glycol 17 g packet Commonly known as: MIRALAX  / GLYCOLAX  Take 17 g by mouth daily as needed.   repaglinide  0.5 MG tablet Commonly known as: PRANDIN  Take 0.5 mg by mouth 3 (three) times daily before meals.   risperiDONE  0.5 MG tablet Commonly known as: RISPERDAL  Take 1 tablet (0.5 mg total) by mouth every 12 (twelve) hours.   senna 8.6 MG Tabs tablet Commonly known as: SENOKOT Take 1 tablet by mouth every evening.   sertraline  100 MG tablet Commonly known as: ZOLOFT  Take 100 mg by mouth daily.   vitamin C 1000 MG tablet Take 1,000 mg by mouth daily.   Vitamin D3 Super Strength 50 MCG (2000 UT)  tablet Generic drug: Cholecalciferol Take 1 tablet by mouth daily.        Follow-up Information     Rodney Clamp, MD Follow up in 1 week(s).   Specialty: Family Medicine Contact information: [address] [place] [address] [phone]                Allergies  Allergen Reactions   Exenatide Other (See Comments)    Exenatide, sold under the brand name Byetta among others, is a medication used to treat type 2 diabetes.   Linagliptin  Other (See Comments)    Tradjenta  (linagliptin ) is a brand-name tablet prescribed for type 2 diabetes.   Lasix  [Furosemide ] Other (See Comments)    Constipation    You were cared for by a hospitalist during your hospital stay. If you have any questions about your discharge medications or the care you received while you were in the hospital after you are discharged, you can call the unit and asked to speak with the hospitalist on call if the hospitalist that took care of you is not available. Once you are discharged, your primary care physician will handle any further medical issues. Please note that no refills for any discharge medications will be authorized once you are discharged, as it is imperative that you return to your primary care physician (or establish a relationship with a primary care physician if you do not have one) for your aftercare needs so that they can reassess your need for medications and monitor your lab values.  You were cared for by a hospitalist during your hospital stay. If you have any questions about your discharge medications or the care you received while you were in the hospital after you are discharged, you can call the unit and asked to speak with the hospitalist on call if the hospitalist that took care of you is not available. Once you are discharged, your primary care physician will handle any further medical issues. Please note that NO REFILLS for any discharge medications will be authorized once you are  discharged, as it is imperative that you return to your primary care physician (or establish a relationship with a primary care physician if you do not have one) for your aftercare needs so that they can reassess your need for medications and monitor your lab values.  Please request your Prim.MD to go over all Hospital Tests and Procedure/Radiological results at the follow up, please  get all Hospital records sent to your Prim MD by signing hospital release before you go home.  Get CBC, CMP, 2 view Chest X ray checked  by Primary MD during your next visit or SNF MD in 5-7 days ( we routinely change or add medications that can affect your baseline labs and fluid status, therefore we recommend that you get the mentioned basic workup next visit with your PCP, your PCP may decide not to get them or add new tests based on their clinical decision)  On your next visit with your primary care physician please Get Medicines reviewed and adjusted.  If you experience worsening of your admission symptoms, develop shortness of breath, life threatening emergency, suicidal or homicidal thoughts you must seek medical attention immediately by calling 911 or calling your MD immediately  if symptoms less severe.  You Must read complete instructions/literature along with all the possible adverse reactions/side effects for all the Medicines you take and that have been prescribed to you. Take any new Medicines after you have completely understood and accpet all the possible adverse reactions/side effects.   Do not drive, operate heavy machinery, perform activities at heights, swimming or participation in water activities or provide baby sitting services if your were admitted for syncope or siezures until you have seen by Primary MD or a Neurologist and advised to do so again.  Do not drive when taking Pain medications.   Procedures/Studies: DG Chest 2 View Result Date: 09/24/2023 CLINICAL DATA:  Fatigue and cough over  the last few days EXAM: CHEST - 2 VIEW COMPARISON:  07/16/2022 FINDINGS: Sternotomy and CABG. Perihilar and peribronchial thickening. Left lower lobe atelectasis or pneumonia. No pneumothorax or pleural effusion. Normal cardiomediastinal silhouette. No acute bone abnormality. IMPRESSION: Bronchitis with left lower lobe atelectasis or pneumonia. Electronically Signed   By: Rozell Cornet M.D.   On: 09/24/2023 18:20     The results of significant diagnostics from this hospitalization (including imaging, microbiology, ancillary and laboratory) are listed below for reference.     Microbiology: Recent Results (from the past 240 hours)  Resp panel by RT-PCR (RSV, Flu A&B, Covid) Anterior Nasal Swab     Status: None   Collection Time: 09/25/23  7:58 AM   Specimen: Anterior Nasal Swab  Result Value Ref Range Status   SARS Coronavirus 2 by RT PCR NEGATIVE NEGATIVE Final    Comment: (NOTE) SARS-CoV-2 target nucleic acids are NOT DETECTED.  The SARS-CoV-2 RNA is generally detectable in upper respiratory specimens during the acute phase of infection. The lowest concentration of SARS-CoV-2 viral copies this assay can detect is 138 copies/mL. A negative result does not preclude SARS-Cov-2 infection and should not be used as the sole basis for treatment or other patient management decisions. A negative result may occur with  improper specimen collection/handling, submission of specimen other than nasopharyngeal swab, presence of viral mutation(s) within the areas targeted by this assay, and inadequate number of viral copies(<138 copies/mL). A negative result must be combined with clinical observations, patient history, and epidemiological information. The expected result is Negative.  Fact Sheet for Patients:  BloggerCourse.com  Fact Sheet for Healthcare Providers:  SeriousBroker.it  This test is no t yet approved or cleared by the [place]   FDA and  has been authorized for detection and/or diagnosis of SARS-CoV-2 by FDA under an Emergency Use Authorization (EUA). This EUA will remain  in effect (meaning this test can be used) for the duration of the COVID-19 declaration under Section 564(b)(1)  of the Act, 21 U.S.C.section 360bbb-3(b)(1), unless the authorization is terminated  or revoked sooner.       Influenza A by PCR NEGATIVE NEGATIVE Final   Influenza B by PCR NEGATIVE NEGATIVE Final    Comment: (NOTE) The Xpert Xpress SARS-CoV-2/FLU/RSV plus assay is intended as an aid in the diagnosis of influenza from Nasopharyngeal swab specimens and should not be used as a sole basis for treatment. Nasal washings and aspirates are unacceptable for Xpert Xpress SARS-CoV-2/FLU/RSV testing.  Fact Sheet for Patients: BloggerCourse.com  Fact Sheet for Healthcare Providers: SeriousBroker.it  This test is not yet approved or cleared by the [place]  FDA and has been authorized for detection and/or diagnosis of SARS-CoV-2 by FDA under an Emergency Use Authorization (EUA). This EUA will remain in effect (meaning this test can be used) for the duration of the COVID-19 declaration under Section 564(b)(1) of the Act, 21 U.S.C. section 360bbb-3(b)(1), unless the authorization is terminated or revoked.     Resp Syncytial Virus by PCR NEGATIVE NEGATIVE Final    Comment: (NOTE) Fact Sheet for Patients: BloggerCourse.com  Fact Sheet for Healthcare Providers: SeriousBroker.it  This test is not yet approved or cleared by the [place]  FDA and has been authorized for detection and/or diagnosis of SARS-CoV-2 by FDA under an Emergency Use Authorization (EUA). This EUA will remain in effect (meaning this test can be used) for the duration of the COVID-19 declaration under Section 564(b)(1) of the Act, 21 U.S.C. section  360bbb-3(b)(1), unless the authorization is terminated or revoked.  Performed at [facility], [address] [place], [place] [address]      Labs: BNP (last 3 results) No results for input(s): "BNP" in the last 8760 hours. Basic Metabolic Panel: Recent Labs  Lab 09/24/23 1804 09/25/23 0758 09/26/23 0930 09/27/23 1312  NA 136 134* 140 138  K 3.9 4.3 3.6 4.1  CL 106 105 108 109  CO2 20* 19* 23 17*  GLUCOSE 166* 297* 180* 250*  BUN 115* 132* 93* 67*  CREATININE 3.27* 3.05* 2.31* 1.86*  CALCIUM  9.1 8.8* 8.9 8.5*  MG  --   --   --  2.2  PHOS  --   --   --  2.7   Liver Function Tests: Recent Labs  Lab 09/25/23 0758  AST 19  ALT 16  ALKPHOS 62  BILITOT 0.9  PROT 7.0  ALBUMIN  3.3*   No results for input(s): "LIPASE", "AMYLASE" in the last 168 hours. No results for input(s): "AMMONIA" in the last 168 hours. CBC: Recent Labs  Lab 09/24/23 1804 09/25/23 0758 09/26/23 0930 09/27/23 1312  WBC 7.9 8.6 8.2 11.4*  NEUTROABS 4.8  --   --   --   HGB 10.7* 10.1* 10.6* 10.6*  HCT 33.5* 31.2* 32.7* 33.2*  MCV 104.0* 102.0* 102.2* 102.2*  PLT 158 167 183 207   Cardiac Enzymes: No results for input(s): "CKTOTAL", "CKMB", "CKMBINDEX", "TROPONINI" in the last 168 hours. BNP: Invalid input(s): "POCBNP" CBG: Recent Labs  Lab 09/26/23 1128 09/26/23 1613 09/26/23 2039 09/27/23 0734 09/27/23 1222  GLUCAP 218* 228* 173* 197* 252*   D-Dimer No results for input(s): "DDIMER" in the last 72 hours. Hgb A1c Recent Labs    09/26/23 0930  HGBA1C 7.0*   Lipid Profile No results for input(s): "CHOL", "HDL", "LDLCALC", "TRIG", "CHOLHDL", "LDLDIRECT" in the last 72 hours. Thyroid  function studies No results for input(s): "TSH", "T4TOTAL", "T3FREE", "THYROIDAB" in the last 72 hours.  Invalid input(s): "FREET3" Anemia work  up No results for input(s): "VITAMINB12", "FOLATE", "FERRITIN", "TIBC", "IRON", "RETICCTPCT" in the last 72 hours. Urinalysis     Component Value Date/Time   COLORURINE YELLOW 09/25/2023 0800   APPEARANCEUR CLEAR 09/25/2023 0800   LABSPEC 1.011 09/25/2023 0800   PHURINE 5.0 09/25/2023 0800   GLUCOSEU >=500 (A) 09/25/2023 0800   GLUCOSEU NEGATIVE 04/27/2019 0830   HGBUR NEGATIVE 09/25/2023 0800   BILIRUBINUR NEGATIVE 09/25/2023 0800   KETONESUR NEGATIVE 09/25/2023 0800   PROTEINUR NEGATIVE 09/25/2023 0800   UROBILINOGEN 0.2 04/27/2019 0830   NITRITE NEGATIVE 09/25/2023 0800   LEUKOCYTESUR NEGATIVE 09/25/2023 0800   Sepsis Labs Recent Labs  Lab 09/24/23 1804 09/25/23 0758 09/26/23 0930 09/27/23 1312  WBC 7.9 8.6 8.2 11.4*   Microbiology Recent Results (from the past 240 hours)  Resp panel by RT-PCR (RSV, Flu A&B, Covid) Anterior Nasal Swab     Status: None   Collection Time: 09/25/23  7:58 AM   Specimen: Anterior Nasal Swab  Result Value Ref Range Status   SARS Coronavirus 2 by RT PCR NEGATIVE NEGATIVE Final    Comment: (NOTE) SARS-CoV-2 target nucleic acids are NOT DETECTED.  The SARS-CoV-2 RNA is generally detectable in upper respiratory specimens during the acute phase of infection. The lowest concentration of SARS-CoV-2 viral copies this assay can detect is 138 copies/mL. A negative result does not preclude SARS-Cov-2 infection and should not be used as the sole basis for treatment or other patient management decisions. A negative result may occur with  improper specimen collection/handling, submission of specimen other than nasopharyngeal swab, presence of viral mutation(s) within the areas targeted by this assay, and inadequate number of viral copies(<138 copies/mL). A negative result must be combined with clinical observations, patient history, and epidemiological information. The expected result is Negative.  Fact Sheet for Patients:  BloggerCourse.com  Fact Sheet for Healthcare Providers:  SeriousBroker.it  This test is no t yet  approved or cleared by the [place]  FDA and  has been authorized for detection and/or diagnosis of SARS-CoV-2 by FDA under an Emergency Use Authorization (EUA). This EUA will remain  in effect (meaning this test can be used) for the duration of the COVID-19 declaration under Section 564(b)(1) of the Act, 21 U.S.C.section 360bbb-3(b)(1), unless the authorization is terminated  or revoked sooner.       Influenza A by PCR NEGATIVE NEGATIVE Final   Influenza B by PCR NEGATIVE NEGATIVE Final    Comment: (NOTE) The Xpert Xpress SARS-CoV-2/FLU/RSV plus assay is intended as an aid in the diagnosis of influenza from Nasopharyngeal swab specimens and should not be used as a sole basis for treatment. Nasal washings and aspirates are unacceptable for Xpert Xpress SARS-CoV-2/FLU/RSV testing.  Fact Sheet for Patients: BloggerCourse.com  Fact Sheet for Healthcare Providers: SeriousBroker.it  This test is not yet approved or cleared by the [place]  FDA and has been authorized for detection and/or diagnosis of SARS-CoV-2 by FDA under an Emergency Use Authorization (EUA). This EUA will remain in effect (meaning this test can be used) for the duration of the COVID-19 declaration under Section 564(b)(1) of the Act, 21 U.S.C. section 360bbb-3(b)(1), unless the authorization is terminated or revoked.     Resp Syncytial Virus by PCR NEGATIVE NEGATIVE Final    Comment: (NOTE) Fact Sheet for Patients: BloggerCourse.com  Fact Sheet for Healthcare Providers: SeriousBroker.it  This test is not yet approved or cleared by the [place]  FDA and has been authorized for detection and/or diagnosis of SARS-CoV-2 by FDA under an  Emergency Use Authorization (EUA). This EUA will remain in effect (meaning this test can be used) for the duration of the COVID-19 declaration under Section 564(b)(1) of  the Act, 21 U.S.C. section 360bbb-3(b)(1), unless the authorization is terminated or revoked.  Performed at [facility], [address] [place], [place] [address]      Time coordinating discharge:  I have spent 35 minutes face to face with the patient and on the ward discussing the patients care, assessment, plan and disposition with other care givers. >50% of the time was devoted counseling the patient about the risks and benefits of treatment/Discharge disposition and coordinating care.   SIGNED:   Maggie Schooner, MD  [facility] 09/27/2023, 3:47 PM   If 7PM-7AM, please contact night-coverage

## 2023-09-27 NOTE — Inpatient Diabetes Management (Signed)
 Inpatient Diabetes Program Recommendations  AACE/ADA: New Consensus Statement on Inpatient Glycemic Control (2015)  Target Ranges:  Prepandial:   less than 140 mg/dL      Peak postprandial:   less than 180 mg/dL (1-2 hours)      Critically ill patients:  140 - 180 mg/dL   Lab Results  Component Value Date   GLUCAP 252 (H) 09/27/2023   HGBA1C 7.0 (H) 09/26/2023    Review of Glycemic Control  Latest Reference Range & Units 09/26/23 07:37 09/26/23 11:28 09/26/23 16:13 09/26/23 20:39 09/27/23 07:34 09/27/23 12:22  Glucose-Capillary 70 - 99 mg/dL 161 (H) 096 (H) 045 (H) 173 (H) 197 (H) 252 (H)  (H): Data is abnormally high  Diabetes history: DM2 Outpatient Diabetes medications: 70/30 25 units QAM, 16 units QPM, Prandin  1 mg every day, Jardiance  10 mg QD Current orders for Inpatient glycemic control: Novolog  0-15 units TID and 0-5 units QHS  Inpatient Diabetes Program Recommendations:    Might consider;  Novolog  3 units TID if he consumes at least 50%.  Will continue to follow while inpatient.  Thank you, Hays Lipschutz, MSN, CDCES Diabetes Coordinator Inpatient Diabetes Program [phone] (team pager from 8a-5p)

## 2023-09-27 NOTE — TOC Transition Note (Signed)
 Transition of Care [facility]) - Discharge Note   Patient Details  Name: Justin Salinas MRN: [id number] Date of Birth: [birth year]  Transition of Care [facility]) CM/SW Contact:  Kathryn Parish, RN Phone Number: 09/27/2023, 4:22 PM   Clinical Narrative:    Per MD patient is ready for discharge to [facility] with [facility] PT/OT that will be set up with Caren [facility] (contract with [place]). Patient daughter will transport to [employer] [facility]. TOC signing off.   Final next level of care: Assisted Living Barriers to Discharge: Barriers Resolved   Patient Goals and CMS Choice Patient states their goals for this hospitalization and ongoing recovery are:: Return to [facility]          Discharge Placement                Patient to be transferred to facility by: Daughter Name of family member notified: Francoise Ishihara Patient and family notified of of transfer: 09/27/23  Discharge Plan and Services Additional resources added to the After Visit Summary for     Discharge Planning Services: CM Consult            DME Arranged: N/A DME Agency: NA       HH Arranged: PT, OT HH Agency: [facility] (Contracted with [facility])        Social Drivers of Health (SDOH) Interventions SDOH Screenings   Food Insecurity: No Food Insecurity (09/25/2023)  Housing: Low Risk  (09/25/2023)  Transportation Needs: No Transportation Needs (09/25/2023)  Utilities: Not At Risk (09/25/2023)  Depression (PHQ2-9): Low Risk  (12/11/2022)  Financial Resource Strain: Low Risk  (12/11/2022)  Physical Activity: Insufficiently Active (12/11/2022)  Social Connections: Moderately Isolated (09/25/2023)  Stress: No Stress Concern Present (12/11/2022)  Tobacco Use: High Risk (09/27/2023)     Readmission Risk Interventions     No data to display

## 2023-09-27 NOTE — NC FL2 (Signed)
 [place]  MEDICAID FL2 LEVEL OF CARE FORM     IDENTIFICATION  Patient Name: Justin Salinas Birthdate: 1941-03-26 Sex: male Admission Date (Current Location): 09/25/2023  [facility] and [place] Number:  Producer, television/film/video and Address:  [facility],  [address] [place] [address]      Provider Number: [id number]  Attending Physician Name and Address:  No att. providers found  Relative Name and Phone Number:  Denece Finger [phone]    Current Level of Care: Other (Comment) Recommended Level of Care: Assisted Living Facility [employer]) Prior Approval Number:    Date Approved/Denied:   PASRR Number:    Discharge Plan: Other (Comment) ([facility])    Current Diagnoses: Patient Active Problem List   Diagnosis Date Noted   AKI (acute kidney injury) (HCC) 09/25/2023   Hyperglycemia 07/24/2022   Recurrent falls 07/23/2022   Cognitive decline 07/23/2022   Lumbar radiculopathy 09/23/2020   Atherosclerosis of abdominal aorta (HCC) 02/26/2020   Status post placement of bone anchored hearing aid (BAHA) 05/09/2019   Asymmetric SNHL (sensorineural hearing loss) 04/02/2019   Iron deficiency anemia 10/22/2017   Type 2 diabetes mellitus with hyperglycemia (HCC) 01/03/2017   PVD (posterior vitreous detachment), left eye 02/16/2016   Vitreous syneresis of right eye 02/16/2016   Diastolic dysfunction    Hypertension associated with diabetes (HCC)    CKD stage 3 due to type 2 diabetes mellitus (HCC)    S/P CABG x 4 05/21/2014   Bilateral dry eyes 01/20/2014   Nuclear cataract, bilateral 12/10/2013   Astigmatism 01/11/2012   Hypermetropia 01/11/2012   Presbyopia 01/11/2012   Carotid artery disease (HCC) 12/01/2007   BPH (benign prostatic hyperplasia) 12/01/2007   Dyslipidemia associated with type 2 diabetes mellitus (HCC) 11/28/2007   CAD (coronary artery disease) 11/28/2007   GERD 11/28/2007    Orientation RESPIRATION BLADDER Height & Weight      Self, Time  Normal Continent Weight: 79.4 kg Height:  5\' 9"  (175.3 cm)  BEHAVIORAL SYMPTOMS/MOOD NEUROLOGICAL BOWEL NUTRITION STATUS  Verbally abusive   Continent Diet (see discharge summary)  AMBULATORY STATUS COMMUNICATION OF NEEDS Skin   Limited Assist Verbally Normal                       Personal Care Assistance Level of Assistance  Bathing, Feeding, Dressing Bathing Assistance: Limited assistance Feeding assistance: Independent Dressing Assistance: Limited assistance     Functional Limitations Info             SPECIAL CARE FACTORS FREQUENCY  PT (By licensed PT), OT (By licensed OT)     PT Frequency: 5X weekly OT Frequency: 5X weekly            Contractures Contractures Info: Not present    Additional Factors Info  Allergies, Insulin  Sliding Scale, Psychotropic   Allergies Info: Exenatide, Linagliptin , Lasix  Psychotropic Info: sertraline  (ZOLOFT ) 100 MG tablet,LORazepam  (ATIVAN ) 0.5 MG tablet,risperiDONE  (RISPERDAL ) tablet 0.5 mg,traZODone  (DESYREL ) tablet 25 mg Insulin  Sliding Scale Info: insulin  lispro (HUMALOG KWIKPEN) 100 UNIT/ML KwikPen Inject 0-10 Units into the skin 3 (three) times daily. Sliding Scale: If BS<70 Call MD, IF BS is 71-150=0 units, 151-200=2 units, 201-250=4 units, 251-300=6 units, 301-350=8 units, 351-400=10 units,       Current Medications (09/27/2023):  This is the current hospital active medication list Current Facility-Administered Medications  Medication Dose Route Frequency Provider Last Rate Last Admin   acetaminophen  (TYLENOL ) tablet 650 mg  650 mg Oral Q6H PRN Jannette Mend, Mir M,  MD       Or   acetaminophen  (TYLENOL ) suppository 650 mg  650 mg Rectal Q6H PRN Jannette Mend, Mir M, MD       albuterol  (PROVENTIL ) (2.5 MG/3ML) 0.083% nebulizer solution 2.5 mg  2.5 mg Nebulization Q2H PRN Jannette Mend, Mir M, MD       alfuzosin  (UROXATRAL ) 24 hr tablet 10 mg  10 mg Oral Q breakfast Amin, Ankit C, MD   10 mg at 09/27/23 1008    arformoterol  (BROVANA ) nebulizer solution 15 mcg  15 mcg Nebulization BID Amin, Ankit C, MD   15 mcg at 09/26/23 1921   aspirin  EC tablet 81 mg  81 mg Oral Daily Amin, Ankit C, MD   81 mg at 09/27/23 0946   azithromycin  (ZITHROMAX ) tablet 500 mg  500 mg Oral Daily Amin, Ankit C, MD   500 mg at 09/27/23 0947   carvedilol  (COREG ) tablet 12.5 mg  12.5 mg Oral BID WC Amin, Ankit C, MD   12.5 mg at 09/27/23 0946   cefTRIAXone  (ROCEPHIN ) 1 g in sodium chloride  0.9 % 100 mL IVPB  1 g Intravenous Q24H Jannette Mend, Mir M, MD 200 mL/hr at 09/27/23 0945 1 g at 09/27/23 0945   dextromethorphan -guaiFENesin  (MUCINEX  DM) 30-600 MG per 12 hr tablet 2 tablet  2 tablet Oral BID Amin, Ankit C, MD   2 tablet at 09/27/23 0947   finasteride  (PROSCAR ) tablet 5 mg  5 mg Oral Daily Amin, Ankit C, MD   5 mg at 09/27/23 0947   heparin  injection 5,000 Units  5,000 Units Subcutaneous Q8H Jannette Mend, Mir M, MD   5,000 Units at 09/27/23 7829   hydrALAZINE  (APRESOLINE ) injection 10 mg  10 mg Intravenous Q4H PRN Amin, Ankit C, MD       insulin  aspart (novoLOG ) injection 0-15 Units  0-15 Units Subcutaneous TID WC Jannette Mend, Mir M, MD   8 Units at 09/27/23 1234   insulin  aspart (novoLOG ) injection 0-5 Units  0-5 Units Subcutaneous QHS Jannette Mend, Mir M, MD   3 Units at 09/25/23 2114   LORazepam  (ATIVAN ) tablet 0.5 mg  0.5 mg Oral Q12H PRN Amin, Ankit C, MD   0.5 mg at 09/27/23 0946   metoprolol  tartrate (LOPRESSOR ) injection 5 mg  5 mg Intravenous Q4H PRN Amin, Ankit C, MD       OLANZapine (ZYPREXA) injection 2.5 mg  2.5 mg Intravenous Q6H PRN Amin, Ankit C, MD       ondansetron  (ZOFRAN ) tablet 4 mg  4 mg Oral Q6H PRN Jannette Mend, Mir M, MD       Or   ondansetron  (ZOFRAN ) injection 4 mg  4 mg Intravenous Q6H PRN Jannette Mend, Mir M, MD       revefenacin  (YUPELRI ) nebulizer solution 175 mcg  175 mcg Nebulization Daily Amin, Ankit C, MD   175 mcg at 09/26/23 1155   risperiDONE  (RISPERDAL ) tablet 0.5 mg  0.5 mg Oral Q12H Amin, Ankit C, MD    0.5 mg at 09/27/23 0947   senna-docusate (Senokot-S) tablet 1 tablet  1 tablet Oral QHS PRN Amin, Ankit C, MD       sertraline  (ZOLOFT ) tablet 100 mg  100 mg Oral Daily Amin, Ankit C, MD   100 mg at 09/27/23 0946   traZODone  (DESYREL ) tablet 25 mg  25 mg Oral QHS PRN Gaylin Ke, MD       Current Outpatient Medications  Medication Sig Dispense Refill   acetaminophen  (TYLENOL ) 325 MG tablet Take 650 mg by mouth every  6 (six) hours as needed for moderate pain (pain score 4-6).     alfuzosin  (UROXATRAL ) 10 MG 24 hr tablet Take 10 mg by mouth daily with breakfast.     amLODipine  (NORVASC ) 10 MG tablet TAKE 1 TABLET EVERY DAY 15 tablet 0   amoxicillin -clavulanate (AUGMENTIN ) 875-125 MG tablet Take 1 tablet by mouth 2 (two) times daily for 5 days.     antiseptic oral rinse (BIOTENE) LIQD 15 mLs by Mouth Rinse route 4 (four) times daily as needed for dry mouth.     Ascorbic Acid  (VITAMIN C) 1000 MG tablet Take 1,000 mg by mouth daily.     aspirin  81 MG tablet Take 81 mg by mouth daily.     carvedilol  (COREG ) 12.5 MG tablet Take 1 tablet (12.5 mg total) by mouth 2 (two) times daily with a meal. (Patient taking differently: Take 25 mg by mouth 2 (two) times daily with a meal. Hold if SBP<105 or HR<60)     Cholecalciferol (VITAMIN D3 SUPER STRENGTH) 50 MCG (2000 UT) TABS Take 1 tablet by mouth daily.     cloNIDine (CATAPRES - DOSED IN MG/24 HR) 0.2 mg/24hr patch Place 0.2 mg onto the skin once a week.     finasteride  (PROSCAR ) 5 MG tablet Take 5 mg by mouth daily.     fluticasone (FLONASE) 50 MCG/ACT nasal spray Place 1 spray into both nostrils daily.     guaiFENesin  (MUCINEX ) 600 MG 12 hr tablet Take 600 mg by mouth 2 (two) times daily.     insulin  lispro (HUMALOG KWIKPEN) 100 UNIT/ML KwikPen Inject 0-10 Units into the skin 3 (three) times daily. Sliding Scale: If BS<70 Call MD, IF BS is 71-150=0 units, 151-200=2 units, 201-250=4 units, 251-300=6 units, 301-350=8 units, 351-400=10 units, BS>400  Call MD     LANTUS SOLOSTAR 100 UNIT/ML Solostar Pen Inject 8 Units into the skin at bedtime.     loratadine (CLARITIN) 10 MG tablet Take 10 mg by mouth daily.     polyethylene glycol (MIRALAX  / GLYCOLAX ) 17 g packet Take 17 g by mouth daily as needed.     repaglinide  (PRANDIN ) 0.5 MG tablet Take 0.5 mg by mouth 3 (three) times daily before meals.     senna (SENOKOT) 8.6 MG TABS tablet Take 1 tablet by mouth every evening.     sertraline  (ZOLOFT ) 100 MG tablet Take 100 mg by mouth daily.     azithromycin  (ZITHROMAX ) 250 MG tablet Take 2 tablets (500 mg total) by mouth daily for 1 day.     [START ON 10/01/2023] empagliflozin  (JARDIANCE ) 10 MG TABS tablet Take 1 tablet (10 mg total) by mouth daily before breakfast.     erythromycin ophthalmic ointment Place 1 Application into both eyes in the morning, at noon, in the evening, and at bedtime. 7 Days     [START ON 10/01/2023] furosemide  (LASIX ) 40 MG tablet Take 1 tablet (40 mg total) by mouth 2 (two) times daily.     LORazepam  (ATIVAN ) 0.5 MG tablet Take 1 tablet (0.5 mg total) by mouth every 12 (twelve) hours as needed. 7 tablet 0   [START ON 10/01/2023] olmesartan (BENICAR) 20 MG tablet Take 1 tablet (20 mg total) by mouth daily.     risperiDONE  (RISPERDAL ) 0.5 MG tablet Take 1 tablet (0.5 mg total) by mouth every 12 (twelve) hours. 10 tablet 0     Discharge Medications: Please see discharge summary for a list of discharge medications.  Relevant Imaging Results:  Relevant Lab Results:  Additional Information SS# [id number]  Kathryn Parish, RN

## 2023-09-27 NOTE — TOC Progression Note (Signed)
 Transition of Care [facility]) - Progression Note    Patient Details  Name: Justin Salinas MRN: [id number] Date of Birth: 10/15/1940  Transition [facility] Care [facility]) CM/SW Contact  Kathryn Parish, RN Phone Number: 09/27/2023, 9:21 AM  Clinical Narrative:    Spoke with [name]). Will be in later today for a visit.  Expected Discharge Plan: Skilled Nursing Facility Barriers to Discharge: Continued Medical Work up  Expected Discharge Plan and Services   Discharge Planning Services: CM Consult   Living arrangements for the past 2 months: Assisted Living Facility                 DME Arranged: N/A DME Agency: NA                   Social Determinants of Health (SDOH) Interventions SDOH Screenings   Food Insecurity: No Food Insecurity (09/25/2023)  Housing: Low Risk  (09/25/2023)  Transportation Needs: No Transportation Needs (09/25/2023)  Utilities: Not At Risk (09/25/2023)  Depression (PHQ2-9): Low Risk  (12/11/2022)  Financial Resource Strain: Low Risk  (12/11/2022)  Physical Activity: Insufficiently Active (12/11/2022)  Social Connections: Moderately Isolated (09/25/2023)  Stress: No Stress Concern Present (12/11/2022)  Tobacco Use: High Risk (09/24/2023)    Readmission Risk Interventions     No data to display

## 2023-10-03 DIAGNOSIS — R2681 Unsteadiness on feet: Secondary | ICD-10-CM | POA: Diagnosis not present

## 2023-10-03 DIAGNOSIS — I5032 Chronic diastolic (congestive) heart failure: Secondary | ICD-10-CM | POA: Diagnosis not present

## 2023-10-07 DIAGNOSIS — I5032 Chronic diastolic (congestive) heart failure: Secondary | ICD-10-CM | POA: Diagnosis not present

## 2023-10-07 DIAGNOSIS — R2681 Unsteadiness on feet: Secondary | ICD-10-CM | POA: Diagnosis not present

## 2023-11-09 DIAGNOSIS — I5032 Chronic diastolic (congestive) heart failure: Secondary | ICD-10-CM | POA: Diagnosis not present

## 2023-11-09 DIAGNOSIS — Z9181 History of falling: Secondary | ICD-10-CM | POA: Diagnosis not present

## 2023-11-09 DIAGNOSIS — R2681 Unsteadiness on feet: Secondary | ICD-10-CM | POA: Diagnosis not present

## 2023-11-18 DIAGNOSIS — R2681 Unsteadiness on feet: Secondary | ICD-10-CM | POA: Diagnosis not present

## 2023-11-18 DIAGNOSIS — Z9181 History of falling: Secondary | ICD-10-CM | POA: Diagnosis not present

## 2023-11-18 DIAGNOSIS — I5032 Chronic diastolic (congestive) heart failure: Secondary | ICD-10-CM | POA: Diagnosis not present

## 2023-11-19 DIAGNOSIS — Z9181 History of falling: Secondary | ICD-10-CM | POA: Diagnosis not present

## 2023-11-19 DIAGNOSIS — I5032 Chronic diastolic (congestive) heart failure: Secondary | ICD-10-CM | POA: Diagnosis not present

## 2023-11-19 DIAGNOSIS — R2681 Unsteadiness on feet: Secondary | ICD-10-CM | POA: Diagnosis not present

## 2023-11-20 DIAGNOSIS — R2681 Unsteadiness on feet: Secondary | ICD-10-CM | POA: Diagnosis not present

## 2023-11-20 DIAGNOSIS — I5032 Chronic diastolic (congestive) heart failure: Secondary | ICD-10-CM | POA: Diagnosis not present

## 2023-11-20 DIAGNOSIS — Z9181 History of falling: Secondary | ICD-10-CM | POA: Diagnosis not present

## 2023-11-21 DIAGNOSIS — I5032 Chronic diastolic (congestive) heart failure: Secondary | ICD-10-CM | POA: Diagnosis not present

## 2023-11-21 DIAGNOSIS — Z9181 History of falling: Secondary | ICD-10-CM | POA: Diagnosis not present

## 2023-11-21 DIAGNOSIS — R2681 Unsteadiness on feet: Secondary | ICD-10-CM | POA: Diagnosis not present

## 2023-11-23 ENCOUNTER — Emergency Department (HOSPITAL_COMMUNITY)

## 2023-11-23 ENCOUNTER — Encounter (HOSPITAL_COMMUNITY): Payer: Self-pay | Admitting: *Deleted

## 2023-11-23 ENCOUNTER — Other Ambulatory Visit: Payer: Self-pay

## 2023-11-23 ENCOUNTER — Inpatient Hospital Stay (HOSPITAL_COMMUNITY)
Admission: EM | Admit: 2023-11-23 | Discharge: 2023-11-25 | DRG: 683 | Disposition: A | Discharge status: Nursing Home (Includes ICF) | Source: Skilled Nursing Facility | Attending: Internal Medicine | Admitting: Internal Medicine

## 2023-11-23 DIAGNOSIS — Z7984 Long term (current) use of oral hypoglycemic drugs: Secondary | ICD-10-CM | POA: Diagnosis not present

## 2023-11-23 DIAGNOSIS — Z83719 Family history of colon polyps, unspecified: Secondary | ICD-10-CM

## 2023-11-23 DIAGNOSIS — Z8249 Family history of ischemic heart disease and other diseases of the circulatory system: Secondary | ICD-10-CM | POA: Diagnosis not present

## 2023-11-23 DIAGNOSIS — F05 Delirium due to known physiological condition: Secondary | ICD-10-CM | POA: Diagnosis not present

## 2023-11-23 DIAGNOSIS — I130 Hypertensive heart and chronic kidney disease with heart failure and stage 1 through stage 4 chronic kidney disease, or unspecified chronic kidney disease: Secondary | ICD-10-CM | POA: Diagnosis not present

## 2023-11-23 DIAGNOSIS — N184 Chronic kidney disease, stage 4 (severe): Secondary | ICD-10-CM | POA: Diagnosis present

## 2023-11-23 DIAGNOSIS — F17220 Nicotine dependence, chewing tobacco, uncomplicated: Secondary | ICD-10-CM | POA: Diagnosis present

## 2023-11-23 DIAGNOSIS — J189 Pneumonia, unspecified organism: Secondary | ICD-10-CM | POA: Diagnosis present

## 2023-11-23 DIAGNOSIS — N400 Enlarged prostate without lower urinary tract symptoms: Secondary | ICD-10-CM | POA: Diagnosis not present

## 2023-11-23 DIAGNOSIS — F03918 Unspecified dementia, unspecified severity, with other behavioral disturbance: Secondary | ICD-10-CM | POA: Diagnosis not present

## 2023-11-23 DIAGNOSIS — I509 Heart failure, unspecified: Secondary | ICD-10-CM

## 2023-11-23 DIAGNOSIS — E1122 Type 2 diabetes mellitus with diabetic chronic kidney disease: Secondary | ICD-10-CM | POA: Diagnosis present

## 2023-11-23 DIAGNOSIS — Z833 Family history of diabetes mellitus: Secondary | ICD-10-CM

## 2023-11-23 DIAGNOSIS — E119 Type 2 diabetes mellitus without complications: Secondary | ICD-10-CM

## 2023-11-23 DIAGNOSIS — F03911 Unspecified dementia, unspecified severity, with agitation: Secondary | ICD-10-CM | POA: Diagnosis not present

## 2023-11-23 DIAGNOSIS — D539 Nutritional anemia, unspecified: Secondary | ICD-10-CM | POA: Diagnosis present

## 2023-11-23 DIAGNOSIS — Z79899 Other long term (current) drug therapy: Secondary | ICD-10-CM | POA: Diagnosis not present

## 2023-11-23 DIAGNOSIS — Z87442 Personal history of urinary calculi: Secondary | ICD-10-CM

## 2023-11-23 DIAGNOSIS — I252 Old myocardial infarction: Secondary | ICD-10-CM | POA: Diagnosis not present

## 2023-11-23 DIAGNOSIS — Z823 Family history of stroke: Secondary | ICD-10-CM

## 2023-11-23 DIAGNOSIS — Z794 Long term (current) use of insulin: Secondary | ICD-10-CM

## 2023-11-23 DIAGNOSIS — Z7982 Long term (current) use of aspirin: Secondary | ICD-10-CM | POA: Diagnosis not present

## 2023-11-23 DIAGNOSIS — E785 Hyperlipidemia, unspecified: Secondary | ICD-10-CM | POA: Diagnosis not present

## 2023-11-23 DIAGNOSIS — I2510 Atherosclerotic heart disease of native coronary artery without angina pectoris: Secondary | ICD-10-CM | POA: Diagnosis present

## 2023-11-23 DIAGNOSIS — I2582 Chronic total occlusion of coronary artery: Secondary | ICD-10-CM | POA: Diagnosis present

## 2023-11-23 DIAGNOSIS — J9811 Atelectasis: Secondary | ICD-10-CM | POA: Diagnosis not present

## 2023-11-23 DIAGNOSIS — R059 Cough, unspecified: Secondary | ICD-10-CM | POA: Diagnosis present

## 2023-11-23 DIAGNOSIS — Z981 Arthrodesis status: Secondary | ICD-10-CM | POA: Diagnosis not present

## 2023-11-23 DIAGNOSIS — N189 Chronic kidney disease, unspecified: Secondary | ICD-10-CM | POA: Diagnosis not present

## 2023-11-23 DIAGNOSIS — I5032 Chronic diastolic (congestive) heart failure: Secondary | ICD-10-CM | POA: Diagnosis present

## 2023-11-23 DIAGNOSIS — Z8701 Personal history of pneumonia (recurrent): Secondary | ICD-10-CM | POA: Diagnosis not present

## 2023-11-23 DIAGNOSIS — I10 Essential (primary) hypertension: Secondary | ICD-10-CM | POA: Diagnosis not present

## 2023-11-23 DIAGNOSIS — Z888 Allergy status to other drugs, medicaments and biological substances status: Secondary | ICD-10-CM

## 2023-11-23 DIAGNOSIS — N179 Acute kidney failure, unspecified: Secondary | ICD-10-CM | POA: Diagnosis present

## 2023-11-23 DIAGNOSIS — I5030 Unspecified diastolic (congestive) heart failure: Secondary | ICD-10-CM | POA: Diagnosis present

## 2023-11-23 DIAGNOSIS — Z951 Presence of aortocoronary bypass graft: Secondary | ICD-10-CM

## 2023-11-23 DIAGNOSIS — Z8049 Family history of malignant neoplasm of other genital organs: Secondary | ICD-10-CM

## 2023-11-23 LAB — CBC WITH DIFFERENTIAL/PLATELET
Abs Immature Granulocytes: 0.08 10*3/uL — ABNORMAL HIGH (ref 0.00–0.07)
Basophils Absolute: 0.1 10*3/uL (ref 0.0–0.1)
Basophils Relative: 1 %
Eosinophils Absolute: 0.6 10*3/uL — ABNORMAL HIGH (ref 0.0–0.5)
Eosinophils Relative: 8 %
HCT: 37.2 % — ABNORMAL LOW (ref 39.0–52.0)
Hemoglobin: 11.8 g/dL — ABNORMAL LOW (ref 13.0–17.0)
Immature Granulocytes: 1 %
Lymphocytes Relative: 21 %
Lymphs Abs: 1.6 10*3/uL (ref 0.7–4.0)
MCH: 32.3 pg (ref 26.0–34.0)
MCHC: 31.7 g/dL (ref 30.0–36.0)
MCV: 101.9 fL — ABNORMAL HIGH (ref 80.0–100.0)
Monocytes Absolute: 0.8 10*3/uL (ref 0.1–1.0)
Monocytes Relative: 10 %
Neutro Abs: 4.5 10*3/uL (ref 1.7–7.7)
Neutrophils Relative %: 59 %
Platelets: 218 10*3/uL (ref 150–400)
RBC: 3.65 MIL/uL — ABNORMAL LOW (ref 4.22–5.81)
RDW: 12.6 % (ref 11.5–15.5)
WBC: 7.5 10*3/uL (ref 4.0–10.5)
nRBC: 0 % (ref 0.0–0.2)

## 2023-11-23 LAB — COMPREHENSIVE METABOLIC PANEL WITH GFR
ALT: 19 U/L (ref 0–44)
AST: 24 U/L (ref 15–41)
Albumin: 3.9 g/dL (ref 3.5–5.0)
Alkaline Phosphatase: 79 U/L (ref 38–126)
Anion gap: 10 (ref 5–15)
BUN: 107 mg/dL — ABNORMAL HIGH (ref 8–23)
CO2: 19 mmol/L — ABNORMAL LOW (ref 22–32)
Calcium: 8.9 mg/dL (ref 8.9–10.3)
Chloride: 110 mmol/L (ref 98–111)
Creatinine, Ser: 2.21 mg/dL — ABNORMAL HIGH (ref 0.61–1.24)
GFR, Estimated: 28 mL/min — ABNORMAL LOW (ref >60)
Glucose, Bld: 125 mg/dL — ABNORMAL HIGH (ref 70–99)
Potassium: 4.7 mmol/L (ref 3.5–5.1)
Sodium: 139 mmol/L (ref 135–145)
Total Bilirubin: 0.7 mg/dL (ref 0.0–1.2)
Total Protein: 8.1 g/dL (ref 6.5–8.1)

## 2023-11-23 LAB — URINALYSIS, ROUTINE W REFLEX MICROSCOPIC
Bacteria, UA: NONE SEEN
Bilirubin Urine: NEGATIVE
Glucose, UA: >=500 mg/dL — AB
Hgb urine dipstick: NEGATIVE
Ketones, ur: NEGATIVE mg/dL
Leukocytes,Ua: NEGATIVE
Nitrite: NEGATIVE
Protein, ur: NEGATIVE mg/dL
Specific Gravity, Urine: 1.012 (ref 1.005–1.030)
pH: 5 (ref 5.0–8.0)

## 2023-11-23 LAB — I-STAT CG4 LACTIC ACID, ED
Lactic Acid, Venous: 1.2 mmol/L (ref 0.5–1.9)
Lactic Acid, Venous: 1.7 mmol/L (ref 0.5–1.9)

## 2023-11-23 LAB — I-STAT VENOUS BLOOD GAS, ED
Acid-base deficit: 8 mmol/L — ABNORMAL HIGH (ref 0.0–2.0)
Bicarbonate: 19.2 mmol/L — ABNORMAL LOW (ref 20.0–28.0)
Calcium, Ion: 1.26 mmol/L (ref 1.15–1.40)
HCT: 36 % — ABNORMAL LOW (ref 39.0–52.0)
Hemoglobin: 12.2 g/dL — ABNORMAL LOW (ref 13.0–17.0)
O2 Saturation: 37 %
Potassium: 4.8 mmol/L (ref 3.5–5.1)
Sodium: 142 mmol/L (ref 135–145)
TCO2: 21 mmol/L — ABNORMAL LOW (ref 22–32)
pCO2, Ven: 47.4 mmHg (ref 44–60)
pH, Ven: 7.216 — ABNORMAL LOW (ref 7.25–7.43)
pO2, Ven: 26 mmHg — CL (ref 32–45)

## 2023-11-23 LAB — PROCALCITONIN: Procalcitonin: <0.1 ng/mL

## 2023-11-23 LAB — GLUCOSE, CAPILLARY
Glucose-Capillary: 103 mg/dL — ABNORMAL HIGH (ref 70–99)
Glucose-Capillary: 207 mg/dL — ABNORMAL HIGH (ref 70–99)

## 2023-11-23 LAB — I-STAT CHEM 8, ED
BUN: 123 mg/dL — ABNORMAL HIGH (ref 8–23)
Calcium, Ion: 1.27 mmol/L (ref 1.15–1.40)
Chloride: 109 mmol/L (ref 98–111)
Creatinine, Ser: 2.3 mg/dL — ABNORMAL HIGH (ref 0.61–1.24)
Glucose, Bld: 120 mg/dL — ABNORMAL HIGH (ref 70–99)
HCT: 37 % — ABNORMAL LOW (ref 39.0–52.0)
Hemoglobin: 12.6 g/dL — ABNORMAL LOW (ref 13.0–17.0)
Potassium: 4.8 mmol/L (ref 3.5–5.1)
Sodium: 141 mmol/L (ref 135–145)
TCO2: 19 mmol/L — ABNORMAL LOW (ref 22–32)

## 2023-11-23 LAB — BRAIN NATRIURETIC PEPTIDE: B Natriuretic Peptide: 144.2 pg/mL — ABNORMAL HIGH (ref 0.0–100.0)

## 2023-11-23 LAB — CBG MONITORING, ED: Glucose-Capillary: 103 mg/dL — ABNORMAL HIGH (ref 70–99)

## 2023-11-23 MED ORDER — SODIUM CHLORIDE 0.9% FLUSH
3.0000 mL | Freq: Two times a day (BID) | INTRAVENOUS | Status: DC
  Administered 2023-11-23 – 2023-11-25 (×4): 3 mL via INTRAVENOUS

## 2023-11-23 MED ORDER — AZITHROMYCIN 250 MG PO TABS
500.0000 mg | ORAL_TABLET | Freq: Once | ORAL | Status: AC
  Administered 2023-11-23: 500 mg via ORAL
  Filled 2023-11-23: qty 2

## 2023-11-23 MED ORDER — FINASTERIDE 5 MG PO TABS
5.0000 mg | ORAL_TABLET | Freq: Every day | ORAL | Status: DC
  Administered 2023-11-23 – 2023-11-25 (×3): 5 mg via ORAL
  Filled 2023-11-23 (×3): qty 1

## 2023-11-23 MED ORDER — AMOXICILLIN-POT CLAVULANATE 875-125 MG PO TABS
1.0000 | ORAL_TABLET | Freq: Once | ORAL | Status: AC
  Administered 2023-11-23: 1 via ORAL
  Filled 2023-11-23: qty 1

## 2023-11-23 MED ORDER — LACTATED RINGERS IV BOLUS
1000.0000 mL | Freq: Once | INTRAVENOUS | Status: AC
  Administered 2023-11-23: 1000 mL via INTRAVENOUS

## 2023-11-23 MED ORDER — HYDRALAZINE HCL 20 MG/ML IJ SOLN: 10.0000 mg | INTRAMUSCULAR | Status: DC | PRN

## 2023-11-23 MED ORDER — INSULIN ASPART 100 UNIT/ML IJ SOLN
0.0000 [IU] | Freq: Three times a day (TID) | INTRAMUSCULAR | Status: DC
  Administered 2023-11-24: 3 [IU] via SUBCUTANEOUS
  Administered 2023-11-24 – 2023-11-25 (×2): 1 [IU] via SUBCUTANEOUS
  Administered 2023-11-25: 5 [IU] via SUBCUTANEOUS

## 2023-11-23 MED ORDER — ONDANSETRON HCL 4 MG PO TABS: 4.0000 mg | ORAL_TABLET | Freq: Four times a day (QID) | ORAL | Status: DC | PRN

## 2023-11-23 MED ORDER — ALFUZOSIN HCL ER 10 MG PO TB24
10.0000 mg | ORAL_TABLET | Freq: Every day | ORAL | Status: DC
  Administered 2023-11-24 – 2023-11-25 (×2): 10 mg via ORAL
  Filled 2023-11-23 (×3): qty 1

## 2023-11-23 MED ORDER — CARVEDILOL 25 MG PO TABS
25.0000 mg | ORAL_TABLET | Freq: Two times a day (BID) | ORAL | Status: DC
  Administered 2023-11-24 – 2023-11-25 (×3): 25 mg via ORAL
  Filled 2023-11-23 (×3): qty 1

## 2023-11-23 MED ORDER — CLONIDINE HCL 0.1 MG/24HR TD PTWK: 0.1000 mg | MEDICATED_PATCH | TRANSDERMAL | Status: DC

## 2023-11-23 MED ORDER — ACETAMINOPHEN 650 MG RE SUPP: 650.0000 mg | Freq: Four times a day (QID) | RECTAL | Status: DC | PRN

## 2023-11-23 MED ORDER — AMLODIPINE BESYLATE 10 MG PO TABS
10.0000 mg | ORAL_TABLET | Freq: Every day | ORAL | Status: DC
  Administered 2023-11-23 – 2023-11-25 (×3): 10 mg via ORAL
  Filled 2023-11-23 (×2): qty 1
  Filled 2023-11-23: qty 2

## 2023-11-23 MED ORDER — SODIUM CHLORIDE 0.9 % IV SOLN
2.0000 g | INTRAVENOUS | Status: DC
  Administered 2023-11-23: 2 g via INTRAVENOUS
  Filled 2023-11-23: qty 20

## 2023-11-23 MED ORDER — SERTRALINE HCL 100 MG PO TABS
100.0000 mg | ORAL_TABLET | Freq: Every day | ORAL | Status: DC
  Administered 2023-11-24 – 2023-11-25 (×2): 100 mg via ORAL
  Filled 2023-11-23 (×2): qty 1

## 2023-11-23 MED ORDER — SODIUM CHLORIDE 0.9 % IV SOLN: INTRAVENOUS | Status: DC

## 2023-11-23 MED ORDER — ACETAMINOPHEN 325 MG PO TABS: 650.0000 mg | ORAL_TABLET | Freq: Four times a day (QID) | ORAL | Status: DC | PRN

## 2023-11-23 MED ORDER — HEPARIN SODIUM (PORCINE) 5000 UNIT/ML IJ SOLN
5000.0000 [IU] | Freq: Three times a day (TID) | INTRAMUSCULAR | Status: DC
  Administered 2023-11-23 – 2023-11-25 (×5): 5000 [IU] via SUBCUTANEOUS
  Filled 2023-11-23 (×6): qty 1

## 2023-11-23 MED ORDER — INSULIN GLARGINE-YFGN 100 UNIT/ML ~~LOC~~ SOLN
8.0000 [IU] | Freq: Every day | SUBCUTANEOUS | Status: DC
  Administered 2023-11-23 – 2023-11-24 (×2): 8 [IU] via SUBCUTANEOUS
  Filled 2023-11-23 (×5): qty 0.08

## 2023-11-23 MED ORDER — ONDANSETRON HCL 4 MG/2ML IJ SOLN
4.0000 mg | Freq: Four times a day (QID) | INTRAMUSCULAR | Status: DC | PRN
Start: 2023-11-23 — End: 2023-11-25

## 2023-11-23 MED ORDER — GUAIFENESIN ER 600 MG PO TB12
600.0000 mg | ORAL_TABLET | Freq: Two times a day (BID) | ORAL | Status: DC
  Administered 2023-11-23 – 2023-11-25 (×5): 600 mg via ORAL
  Filled 2023-11-23 (×5): qty 1

## 2023-11-23 MED ORDER — ALBUTEROL SULFATE (2.5 MG/3ML) 0.083% IN NEBU: 2.5000 mg | INHALATION_SOLUTION | Freq: Four times a day (QID) | RESPIRATORY_TRACT | Status: DC | PRN

## 2023-11-23 MED ORDER — ASPIRIN 81 MG PO TBEC
81.0000 mg | DELAYED_RELEASE_TABLET | Freq: Every day | ORAL | Status: DC
  Administered 2023-11-24 – 2023-11-25 (×2): 81 mg via ORAL
  Filled 2023-11-23 (×2): qty 1

## 2023-11-23 NOTE — ED Notes (Signed)
 EDP at [facility]

## 2023-11-23 NOTE — ED Provider Notes (Signed)
 [place] EMERGENCY DEPARTMENT AT [facility] Provider Note  CSN: [id number] Arrival date & time: 11/23/23 1039  Chief Complaint(s) elevated BUN/creatinine  HPI Justin Salinas is a 83 y.o. male with PMH CAD, CKD 3, T2DM, CHF, HTN, GERD, HLD, dementia who presents to the emergency room for evaluation of abnormal labs.  Patient previously admitted to hospital on 09/25/2023 for community-acquired pneumonia with a significant AKI that resolved with fluids.  Patient had outpatient lab evaluation showing significant uremia with a BUN of 127 and a creatinine of 2.44.  Patient reportedly having worsening behavioral disturbances at night but he does have a known history of sundowning.  They attempted to treat him with IV fluids at his facility but he pulled out his IVs twice when he was sundowning and he was sent to the emergency room for further evaluation.  Here in the ER he is calm and answering questions but additional history difficult to obtain in the setting of his known dementia.  Additional history obtained from patient's daughter who states that he had been receiving IV hydration and his skilled nursing facility for the last 2 to 3 days and he was primarily sent in as his kidney function does not appear to be improving despite IV hydration.   Past Medical History Past Medical History:  Diagnosis Date   Allergy    Anemia    Asthma    Atherosclerosis of abdominal aorta (HCC) 02/26/2020   Seen on x-ray L-spine September 2021   BPH (benign prostatic hyperplasia)    CAD (coronary artery disease)    a. s/p MI 09/1993;  b. known CTO of RCA;  c. 06/2014 Cath: LM short/mod dzs, LAD 95ost, LCX nl, RI nl, RCA 100, EF 55%;  c. 06/2014 s/p CABG x 4: LIMA->LAD, VG->D1, VG->OM1, VG->Acute Marginal.   Carotid stenosis    a. Carotid US  1/17:  RICA 40-59%; LICA 60-79% // Carotid US  1/22: R 1-39; L 40-59; L VA aberrant flow; bilat subclavian stenosis // Carotid US  1/23: R 1-39, L 40-59; R  subclavian stenosis   CKD (chronic kidney disease), stage III (HCC)    Constipation    in the past 2-3 weeks   Diabetes (HCC)    AODM   Diastolic dysfunction    a. 05/2015 Echo: EF 60-65%, LVH, Gr 1 DD, mild MR, mod dil RA/LA, mod TR, PASP .   Essential hypertension    GERD (gastroesophageal reflux disease)    History of kidney stones    Hyperlipidemia    Patient Active Problem List   Diagnosis Date Noted   Acute kidney injury superimposed on chronic kidney disease (HCC) 11/23/2023   Heart failure with preserved ejection fraction (HCC) 11/23/2023   Macrocytic anemia 11/23/2023   Community acquired pneumonia 11/23/2023   Dementia with behavioral disturbance (HCC) 11/23/2023   AKI (acute kidney injury) (HCC) 09/25/2023   Hyperglycemia 07/24/2022   Recurrent falls 07/23/2022   Cognitive decline 07/23/2022   Lumbar radiculopathy 09/23/2020   Atherosclerosis of abdominal aorta (HCC) 02/26/2020   Status post placement of bone anchored hearing aid (BAHA) 05/09/2019   Asymmetric SNHL (sensorineural hearing loss) 04/02/2019   Iron deficiency anemia 10/22/2017   Type 2 diabetes mellitus with hyperglycemia (HCC) 01/03/2017   PVD (posterior vitreous detachment), left eye 02/16/2016   Vitreous syneresis of right eye 02/16/2016   Diastolic dysfunction    Hypertension associated with diabetes (HCC)    CKD stage 3 due to type 2 diabetes mellitus (HCC)    S/P  CABG x 4 05/21/2014   Bilateral dry eyes 01/20/2014   Nuclear cataract, bilateral 12/10/2013   Astigmatism 01/11/2012   Hypermetropia 01/11/2012   Presbyopia 01/11/2012   HYPERTENSION 12/01/2007   Carotid artery disease (HCC) 12/01/2007   BPH (benign prostatic hyperplasia) 12/01/2007   Controlled type 2 diabetes mellitus, with long-term current use of insulin  (HCC) 11/28/2007   CAD (coronary artery disease) 11/28/2007   GERD 11/28/2007   Home Medication(s) Prior to Admission medications   Medication Sig Start Date End Date  Taking? Authorizing Provider  acetaminophen  (TYLENOL ) 325 MG tablet Take 650 mg by mouth every 6 (six) hours as needed for moderate pain (pain score 4-6).    [provider]  alfuzosin  (UROXATRAL ) 10 MG 24 hr tablet Take 10 mg by mouth daily with breakfast.    [provider]  amLODipine  (NORVASC ) 10 MG tablet TAKE 1 TABLET EVERY DAY 11/22/21   Wonda Sharper, MD  antiseptic oral rinse (BIOTENE) LIQD 15 mLs by Mouth Rinse route 4 (four) times daily as needed for dry mouth.    [provider]  Ascorbic Acid  (VITAMIN C) 1000 MG tablet Take 1,000 mg by mouth daily.    [provider]  aspirin  81 MG tablet Take 81 mg by mouth daily.    [provider]  carvedilol  (COREG ) 12.5 MG tablet Take 1 tablet (12.5 mg total) by mouth 2 (two) times daily with a meal. Patient taking differently: Take 25 mg by mouth 2 (two) times daily with a meal. Hold if SBP<105 or HR<60 07/27/22   Odell Celinda Balo, MD  Cholecalciferol (VITAMIN D3 SUPER STRENGTH) 50 MCG (2000 UT) TABS Take 1 tablet by mouth daily.    [provider]  cloNIDine (CATAPRES - DOSED IN MG/24 HR) 0.2 mg/24hr patch Place 0.2 mg onto the skin once a week.    [provider]  empagliflozin  (JARDIANCE ) 10 MG TABS tablet Take 1 tablet (10 mg total) by mouth daily before breakfast. 10/01/23   Amin, Ankit C, MD  erythromycin ophthalmic ointment Place 1 Application into both eyes in the morning, at noon, in the evening, and at bedtime. 7 Days 09/24/23   [provider]  finasteride  (PROSCAR ) 5 MG tablet Take 5 mg by mouth daily. 09/20/23   [provider]  fluticasone (FLONASE) 50 MCG/ACT nasal spray Place 1 spray into both nostrils daily. 09/02/23   [provider]  furosemide  (LASIX ) 40 MG tablet Take 1 tablet (40 mg total) by mouth 2 (two) times daily. 10/01/23   Amin, Ankit C, MD  guaiFENesin  (MUCINEX ) 600 MG 12 hr tablet Take 600 mg by mouth 2 (two) times daily.     [provider]  insulin  lispro (HUMALOG KWIKPEN) 100 UNIT/ML KwikPen Inject 0-10 Units into the skin 3 (three) times daily. Sliding Scale: If BS<70 Call MD, IF BS is 71-150=0 units, 151-200=2 units, 201-250=4 units, 251-300=6 units, 301-350=8 units, 351-400=10 units, BS>400 Call MD    [provider]  LANTUS  SOLOSTAR 100 UNIT/ML Solostar Pen Inject 8 Units into the skin at bedtime. 12/10/22   [provider]  loratadine (CLARITIN) 10 MG tablet Take 10 mg by mouth daily.    [provider]  LORazepam  (ATIVAN ) 0.5 MG tablet Take 1 tablet (0.5 mg total) by mouth every 12 (twelve) hours as needed. 09/27/23   Amin, Ankit C, MD  olmesartan  (BENICAR ) 20 MG tablet Take 1 tablet (20 mg total) by mouth daily. 10/01/23   Caleen Burgess BROCKS, MD  polyethylene  glycol (MIRALAX  / GLYCOLAX ) 17 g packet Take 17 g by mouth daily as needed.    [provider]  repaglinide  (PRANDIN ) 0.5 MG tablet Take 0.5 mg by mouth 3 (three) times daily before meals.    [provider]  risperiDONE  (RISPERDAL ) 0.5 MG tablet Take 1 tablet (0.5 mg total) by mouth every 12 (twelve) hours. 09/27/23   Amin, Ankit C, MD  senna (SENOKOT) 8.6 MG TABS tablet Take 1 tablet by mouth every evening.    [provider]  sertraline  (ZOLOFT ) 100 MG tablet Take 100 mg by mouth daily. 09/21/23   [provider]                                                                                                                                    Past Surgical History Past Surgical History:  Procedure Laterality Date   ANGIOPLASTY  09/22/1993   POBA of RCA (Dr. FABIENE Pinion)   BACK SURGERY  2000   back fusion - Dr. Victory Gunnels   CARDIAC CATHETERIZATION  11/28/1998   silent occlusion of RCA w/collaterals from left coronary system, prox LAD w/40-50% eccentric narrowing, 1st diagonal with 70-80% eccentric narrowing, 85% narrowing of prox small OM1 (Dr. FABIENE Pinion)   CAROTID DOPPLER  07/2011    left subclavian (50-69%); right bulb (0-49%); RICA (normal); left mid-distal CCA (0-49%); left bulb/prox ICA (50-69%); left vertebral with abnormal antegrade flow   CORONARY ARTERY BYPASS GRAFT N/A 05/21/2014   Procedure: CORONARY ARTERY BYPASS GRAFTING (CABG) x  four, using left internal mammary artery and right leg greater saphenous vein harvested endoscopically;  Surgeon: Elspeth JAYSON Millers, MD;  Location: [facility] OR;  Service: Open Heart Surgery;  Laterality: N/A;   INTRAOPERATIVE TRANSESOPHAGEAL ECHOCARDIOGRAM N/A 05/21/2014   Procedure: INTRAOPERATIVE TRANSESOPHAGEAL ECHOCARDIOGRAM;  Surgeon: Elspeth JAYSON Millers, MD;  Location: [facility] OR;  Service: Open Heart Surgery;  Laterality: N/A;   LEFT HEART CATHETERIZATION WITH CORONARY ANGIOGRAM N/A 05/19/2014   Procedure: LEFT HEART CATHETERIZATION WITH CORONARY ANGIOGRAM;  Surgeon: Ozell JONETTA Fell, MD;  Location: [facility] CATH LAB;  Service: Cardiovascular;  Laterality: N/A;   LUMBAR LAMINECTOMY/DECOMPRESSION MICRODISCECTOMY Left 09/23/2020   Procedure: Laminectomy and Foraminotomy - left - Lumbar three-Lumbar four;  Surgeon: Gunnels Victory, MD;  Location: [facility] OR;  Service: Neurosurgery;  Laterality: Left;   NASAL SINUS SURGERY  2010   NM MYOCAR PERF WALL MOTION  09/19/2009   bruce myoview - mild perfusion defect in basal inferior region (infarct/scar), EF 60%, low risk scan   RENAL DOPPLER  10/2011   SMA w/ 70-99% diameter reduction & high grade stenosis; R&L renals w/narrowing and increased velocities (60-99%), R kidney smaller than L   TRANSTHORACIC ECHOCARDIOGRAM  10/20/2012   EF 55-60%, moderate concentric hypertrophy, ventricular septum thickness increased, calcified MV annulus   VASECTOMY  1963   Family History Family History  Problem Relation Age of Onset   Uterine cancer Mother  Stroke Father    Diabetes Sister    Colon polyps Brother    Heart disease Brother    Colon cancer Neg Hx    Esophageal cancer Neg Hx    Rectal cancer Neg Hx    Stomach  cancer Neg Hx     Social History Social History   Tobacco Use   Smoking status: Former    Current packs/day: 0.00    Types: Cigarettes    Quit date: 06/04/1972    Years since quitting: 51.5   Smokeless tobacco: Current    Types: Chew   Tobacco comments:    quit about 40 years  Vaping Use   Vaping status: Never Used  Substance Use Topics   Alcohol use: Yes    Alcohol/week: 3.0 standard drinks of alcohol    Types: 3 Standard drinks or equivalent per week    Comment: a glass of red wine 3 or 4 days a week.   Drug use: No   Allergies Exenatide, Linagliptin , and Lasix  [furosemide ]  Review of Systems Review of Systems  Unable to perform ROS: Dementia    Physical Exam Vital Signs  I have reviewed the triage vital signs BP (!) 151/70   Pulse 77   Temp 97.9 F (36.6 C) (Oral)   Resp 16   Ht 5' 9 (1.753 m)   Wt 79.4 kg   SpO2 100%   BMI 25.84 kg/m   Physical Exam Constitutional:      General: He is not in acute distress.    Appearance: Normal appearance.  HENT:     Head: Normocephalic and atraumatic.     Nose: No congestion or rhinorrhea.   Eyes:     General:        Right eye: No discharge.        Left eye: No discharge.     Extraocular Movements: Extraocular movements intact.     Pupils: Pupils are equal, round, and reactive to light.    Cardiovascular:     Rate and Rhythm: Normal rate and regular rhythm.     Heart sounds: No murmur heard. Pulmonary:     Effort: No respiratory distress.     Breath sounds: No wheezing or rales.  Abdominal:     General: There is no distension.     Tenderness: There is no abdominal tenderness.   Musculoskeletal:        General: Normal range of motion.     Cervical back: Normal range of motion.   Skin:    General: Skin is warm and dry.   Neurological:     General: No focal deficit present.     Mental Status: He is alert.     ED Results and Treatments Labs (all labs ordered are listed, but only abnormal results  are displayed) Labs Reviewed  CBC WITH DIFFERENTIAL/PLATELET - Abnormal; Notable for the following components:      Result Value   RBC 3.65 (*)    Hemoglobin 11.8 (*)    HCT 37.2 (*)    MCV 101.9 (*)    Eosinophils Absolute 0.6 (*)    Abs Immature Granulocytes 0.08 (*)    All other components within normal limits  COMPREHENSIVE METABOLIC PANEL WITH GFR - Abnormal; Notable for the following components:   CO2 19 (*)    Glucose, Bld 125 (*)    BUN 107 (*)    Creatinine, Ser 2.21 (*)    GFR, Estimated 28 (*)    All other components  within normal limits  URINALYSIS, ROUTINE W REFLEX MICROSCOPIC - Abnormal; Notable for the following components:   Glucose, UA >=500 (*)    All other components within normal limits  I-STAT CHEM 8, ED - Abnormal; Notable for the following components:   BUN 123 (*)    Creatinine, Ser 2.30 (*)    Glucose, Bld 120 (*)    TCO2 19 (*)    Hemoglobin 12.6 (*)    HCT 37.0 (*)    All other components within normal limits  I-STAT VENOUS BLOOD GAS, ED - Abnormal; Notable for the following components:   pH, Ven 7.216 (*)    pO2, Ven 26 (*)    Bicarbonate 19.2 (*)    TCO2 21 (*)    Acid-base deficit 8.0 (*)    HCT 36.0 (*)    Hemoglobin 12.2 (*)    All other components within normal limits  CBG MONITORING, ED - Abnormal; Notable for the following components:   Glucose-Capillary 103 (*)    All other components within normal limits  BRAIN NATRIURETIC PEPTIDE  PROCALCITONIN  I-STAT CG4 LACTIC ACID, ED  I-STAT CG4 LACTIC ACID, ED                                                                                                                          Radiology DG Chest Portable 1 View Result Date: 11/23/2023 CLINICAL DATA:  Pneumonia EXAM: PORTABLE CHEST 1 VIEW COMPARISON:  Chest radiograph dated 09/24/2023 FINDINGS: Normal lung volumes. Patchy right mid and bilateral lower lung opacities. No pleural effusion or pneumothorax. The heart size and mediastinal  contours are within normal limits. Median sternotomy wires are nondisplaced. IMPRESSION: Patchy right mid and bilateral lower lung opacities, which may represent atelectasis or multifocal infection. Electronically Signed   By: Limin  Xu M.D.   On: 11/23/2023 11:49    Pertinent labs & imaging results that were available during my care of the patient were reviewed by me and considered in my medical decision making (see MDM for details).  Medications Ordered in ED Medications  amLODipine  (NORVASC ) tablet 10 mg (10 mg Oral Given 11/23/23 1237)  heparin  injection 5,000 Units (5,000 Units Subcutaneous Given 11/23/23 1510)  sodium chloride  flush (NS) 0.9 % injection 3 mL (has no administration in time range)  hydrALAZINE  (APRESOLINE ) injection 10 mg (has no administration in time range)  acetaminophen  (TYLENOL ) tablet 650 mg (has no administration in time range)    Or  acetaminophen  (TYLENOL ) suppository 650 mg (has no administration in time range)  0.9 %  sodium chloride  infusion (has no administration in time range)  cefTRIAXone  (ROCEPHIN ) 2 g in sodium chloride  0.9 % 100 mL IVPB (2 g Intravenous New Bag/Given 11/23/23 1510)  ondansetron  (ZOFRAN ) tablet 4 mg (has no administration in time range)    Or  ondansetron  (ZOFRAN ) injection 4 mg (has no administration in time range)  albuterol  (PROVENTIL ) (2.5 MG/3ML) 0.083% nebulizer solution 2.5 mg (has no administration in time range)  guaiFENesin  (MUCINEX ) 12 hr tablet 600 mg (has no administration in time range)  insulin  aspart (novoLOG ) injection 0-9 Units (has no administration in time range)  sertraline  (ZOLOFT ) tablet 100 mg (has no administration in time range)  insulin  glargine-yfgn (SEMGLEE ) injection 8 Units (has no administration in time range)  lactated ringers  bolus 1,000 mL (0 mLs Intravenous Stopped 11/23/23 1221)  lactated ringers  bolus 1,000 mL (0 mLs Intravenous Stopped 11/23/23 1425)  amoxicillin -clavulanate (AUGMENTIN ) 875-125 MG per  tablet 1 tablet (1 tablet Oral Given 11/23/23 1237)  azithromycin  (ZITHROMAX ) tablet 500 mg (500 mg Oral Given 11/23/23 1237)                                                                                                                                     Procedures .Critical Care  Performed by: Albertina Dixon, MD Authorized by: Albertina Dixon, MD   Critical care provider statement:    Critical care time (minutes):  30   Critical care was necessary to treat or prevent imminent or life-threatening deterioration of the following conditions:  Dehydration   Critical care was time spent personally by me on the following activities:  Development of treatment plan with patient or surrogate, discussions with consultants, evaluation of patient's response to treatment, examination of patient, ordering and review of laboratory studies, ordering and review of radiographic studies, ordering and performing treatments and interventions, pulse oximetry, re-evaluation of patient's condition and review of old charts   (including critical care time)  Medical Decision Making / ED Course   This patient presents to the ED for concern of abnormal labs, this involves an extensive number of treatment options, and is a complaint that carries with it a high risk of complications and morbidity.  The differential diagnosis includes prerenal AKI, medical renal disease, obstructive uropathy, dehydration, medication side effect  MDM: Patient seen emergency room for evaluation of abnormal labs.  Exam largely unremarkable.  Laboratory evaluation with initial pH is 7.21, hemoglobin 11.8, creatinine 2.21 but BUN significant elevated at 107.  Chest x-ray with multifocal disease that may be early multifocal pneumonia and patient started on oral Augmentin  and oral azithromycin .  Aggressive fluid resuscitation begun in the emergency department and as he has been failing outpatient interventions will require hospital admission for  acute kidney injury.  Patient admitted   Additional history obtained: -Additional history obtained from daughter -External records from outside source obtained and reviewed including: Chart review including previous notes, labs, imaging, consultation notes   Lab Tests: -I ordered, reviewed, and interpreted labs.   The pertinent results include:   Labs Reviewed  CBC WITH DIFFERENTIAL/PLATELET - Abnormal; Notable for the following components:      Result Value   RBC 3.65 (*)    Hemoglobin 11.8 (*)    HCT 37.2 (*)    MCV 101.9 (*)    Eosinophils Absolute 0.6 (*)    Abs Immature Granulocytes 0.08 (*)  All other components within normal limits  COMPREHENSIVE METABOLIC PANEL WITH GFR - Abnormal; Notable for the following components:   CO2 19 (*)    Glucose, Bld 125 (*)    BUN 107 (*)    Creatinine, Ser 2.21 (*)    GFR, Estimated 28 (*)    All other components within normal limits  URINALYSIS, ROUTINE W REFLEX MICROSCOPIC - Abnormal; Notable for the following components:   Glucose, UA >=500 (*)    All other components within normal limits  I-STAT CHEM 8, ED - Abnormal; Notable for the following components:   BUN 123 (*)    Creatinine, Ser 2.30 (*)    Glucose, Bld 120 (*)    TCO2 19 (*)    Hemoglobin 12.6 (*)    HCT 37.0 (*)    All other components within normal limits  I-STAT VENOUS BLOOD GAS, ED - Abnormal; Notable for the following components:   pH, Ven 7.216 (*)    pO2, Ven 26 (*)    Bicarbonate 19.2 (*)    TCO2 21 (*)    Acid-base deficit 8.0 (*)    HCT 36.0 (*)    Hemoglobin 12.2 (*)    All other components within normal limits  CBG MONITORING, ED - Abnormal; Notable for the following components:   Glucose-Capillary 103 (*)    All other components within normal limits  BRAIN NATRIURETIC PEPTIDE  PROCALCITONIN  I-STAT CG4 LACTIC ACID, ED  I-STAT CG4 LACTIC ACID, ED      EKG   EKG Interpretation Date/Time:  Saturday November 23 2023 15:03:50 EDT Ventricular  Rate:  73 PR Interval:  229 QRS Duration:  83 QT Interval:  427 QTC Calculation: 471 R Axis:   75  Text Interpretation: Sinus rhythm Prolonged PR interval Anterior infarct, old Confirmed by Ender Rorke ([id number]) on 11/23/2023 3:35:18 PM         Imaging Studies ordered: I ordered imaging studies including chest x-ray I independently visualized and interpreted imaging. I agree with the radiologist interpretation   Medicines ordered and prescription drug management: Meds ordered this encounter  Medications   lactated ringers  bolus 1,000 mL   lactated ringers  bolus 1,000 mL   amoxicillin -clavulanate (AUGMENTIN ) 875-125 MG per tablet 1 tablet   azithromycin  (ZITHROMAX ) tablet 500 mg   amLODipine  (NORVASC ) tablet 10 mg   heparin  injection 5,000 Units   sodium chloride  flush (NS) 0.9 % injection 3 mL   hydrALAZINE  (APRESOLINE ) injection 10 mg   OR Linked Order Group    acetaminophen  (TYLENOL ) tablet 650 mg    acetaminophen  (TYLENOL ) suppository 650 mg   0.9 %  sodium chloride  infusion   cefTRIAXone  (ROCEPHIN ) 2 g in sodium chloride  0.9 % 100 mL IVPB    Antibiotic Indication::   CAP   OR Linked Order Group    ondansetron  (ZOFRAN ) tablet 4 mg    ondansetron  (ZOFRAN ) injection 4 mg   albuterol  (PROVENTIL ) (2.5 MG/3ML) 0.083% nebulizer solution 2.5 mg   guaiFENesin  (MUCINEX ) 12 hr tablet 600 mg   insulin  aspart (novoLOG ) injection 0-9 Units    Correction coverage::   Sensitive (thin, NPO, renal)    CBG < 70::   Implement Hypoglycemia Standing Orders and refer to Hypoglycemia Standing Orders sidebar report    CBG 70 - 120::   0 units    CBG 121 - 150::   1 unit    CBG 151 - 200::   2 units    CBG 201 - 250::   3  units    CBG 251 - 300::   5 units    CBG 301 - 350::   7 units    CBG 351 - 400:   9 units    CBG > 400:   call MD and obtain STAT lab verification   sertraline  (ZOLOFT ) tablet 100 mg   insulin  glargine-yfgn (SEMGLEE ) injection 8 Units    -I have reviewed the  patients home medicines and have made adjustments as needed  Critical interventions Fluid resuscitation    Cardiac Monitoring: The patient was maintained on a cardiac monitor.  I personally viewed and interpreted the cardiac monitored which showed an underlying rhythm of: NSR  Social Determinants of Health:  Factors impacting patients care include: Lives in skilled nursing facility   Reevaluation: After the interventions noted above, I reevaluated the patient and found that they have :improved  Co morbidities that complicate the patient evaluation  Past Medical History:  Diagnosis Date   Allergy    Anemia    Asthma    Atherosclerosis of abdominal aorta (HCC) 02/26/2020   Seen on x-ray L-spine September 2021   BPH (benign prostatic hyperplasia)    CAD (coronary artery disease)    a. s/p MI 09/1993;  b. known CTO of RCA;  c. 06/2014 Cath: LM short/mod dzs, LAD 95ost, LCX nl, RI nl, RCA 100, EF 55%;  c. 06/2014 s/p CABG x 4: LIMA->LAD, VG->D1, VG->OM1, VG->Acute Marginal.   Carotid stenosis    a. Carotid US  1/17:  RICA 40-59%; LICA 60-79% // Carotid US  1/22: R 1-39; L 40-59; L VA aberrant flow; bilat subclavian stenosis // Carotid US  1/23: R 1-39, L 40-59; R subclavian stenosis   CKD (chronic kidney disease), stage III (HCC)    Constipation    in the past 2-3 weeks   Diabetes (HCC)    AODM   Diastolic dysfunction    a. 05/2015 Echo: EF 60-65%, LVH, Gr 1 DD, mild MR, mod dil RA/LA, mod TR, PASP .   Essential hypertension    GERD (gastroesophageal reflux disease)    History of kidney stones    Hyperlipidemia       Dispostion: I considered admission for this patient, and patient require hospital admission for acute kidney injury requiring IV hydration     Final Clinical Impression(s) / ED Diagnoses Final diagnoses:  AKI (acute kidney injury) [facility])     @PCDICTATION @    Albertina Dixon, MD 11/23/23 1537

## 2023-11-23 NOTE — ED Triage Notes (Signed)
 BIB [facility] from [facility] for elevated BUN/Creatinine, and AMS. H/o dementia and CKD. Dementia at baseline at this time. Oriented to self. 2 IVs started yesterday, and pt pulled them out. Some sundowning last night. Alert, NAD, calm, pleasant, interactive, resps e/u, skin W&D. Mentioned dysuria and L flank pain.

## 2023-11-23 NOTE — Consult Note (Signed)
 [facility] Renal Consultation Note  Requesting MD: Claudene Reeves Indication for Consultation: A on CRF  HPI:  Justin Salinas is a 83 y.o. male with T2DM, HTN, HFpEF, hyperlipidemia, dementia, stage 3/4 CKD-  baseline crt in the high 1's but with BUN 40-50.  Looks like he had a hospitalization in April of 25-  had PNA and AKI- which did get better-  crt down to 1.8 but BUN not to baseline.  Upon discharge was placed back on his ARB and SGLT2.  Apparently at his assisted living-  he was found to have abnormal labs mostly high crt-  ivf were attempted at the facility but pt was confused and could not cooperate with therapy so brought here to hospital-  crt 2.3 which is not too far off of his baseline but BUN of 107 which may be contributing to some confusion.  ARB and SGLT2 on med list-   urine with glucose so suspect is taking SGLT2-  otherwise urine is bland.  BP is actually high.  Meds above have been held and patient has been given ivf.  Patient is seen in his room-  he says he recognizes me but then says you are not her  he is eating his dinner without assistance-  unable to provide any history   Creatinine  Date/Time Value Ref Range Status  03/12/2023 12:00 AM 1.8 (A) 0.6 - 1.3 Final  11/28/2022 12:00 AM 2.0 (A) 0.6 - 1.3 Final  07/20/2021 12:00 AM 1.7 (A) 0.6 - 1.3 Final  04/21/2021 12:00 AM 2.0 (A) 0.6 - 1.3 Final  01/13/2021 12:00 AM 1.9 (A) 0.6 - 1.3 Final  12/22/2018 12:00 AM 2.0 (A) 0.6 - 1.3 Final   Creatinine, Ser  Date/Time Value Ref Range Status  11/23/2023 11:08 AM 2.30 (H) 0.61 - 1.24 mg/dL Final  93/78/7974 89:44 AM 2.21 (H) 0.61 - 1.24 mg/dL Final  95/74/7974 98:87 PM 1.86 (H) 0.61 - 1.24 mg/dL Final  95/75/7974 90:69 AM 2.31 (H) 0.61 - 1.24 mg/dL Final  95/76/7974 92:41 AM 3.05 (H) 0.61 - 1.24 mg/dL Final  95/77/7974 93:95 PM 3.27 (H) 0.61 - 1.24 mg/dL Final  97/78/7975 93:55 AM 1.71 (H) 0.61 - 1.24 mg/dL Final  97/79/7975 95:91 AM 1.62 (H) 0.61 -  1.24 mg/dL Final  97/80/7975 89:62 PM 1.79 (H) 0.61 - 1.24 mg/dL Final  97/80/7975 90:74 AM 1.63 (H) 0.61 - 1.24 mg/dL Final  97/87/7975 88:57 AM 1.59 (H) 0.61 - 1.24 mg/dL Final  91/69/7976 91:47 AM 1.52 (H) 0.61 - 1.24 mg/dL Final  95/81/7976 91:73 AM 1.59 (H) 0.40 - 1.50 mg/dL Final  95/77/7977 94:48 AM 2.54 (H) 0.61 - 1.24 mg/dL Final  98/81/7977 98:41 PM 2.12 (H) 0.40 - 1.50 mg/dL Final  90/78/7978 90:99 AM 1.69 (H) 0.40 - 1.50 mg/dL Final  95/80/7978 90:95 AM 2.65 (H) 0.40 - 1.50 mg/dL Final  97/84/7978 89:42 AM 2.06 (H) 0.40 - 1.50 mg/dL Final  88/76/7979 91:69 AM 1.70 (H) 0.40 - 1.50 mg/dL Final  94/87/7979 91:94 AM 2.17 (H) 0.40 - 1.50 mg/dL Final  96/83/7979 91:91 AM 2.02 (H) 0.40 - 1.50 mg/dL Final  98/83/7979 90:77 AM 1.63 (H) 0.76 - 1.27 mg/dL Final  87/72/7980 91:62 AM 1.53 (H) 0.76 - 1.27 mg/dL Final  87/96/7980 90:60 AM 1.53 (H) 0.76 - 1.27 mg/dL Final  90/83/7980 91:53 AM 1.39 0.40 - 1.50 mg/dL Final  94/84/7980 91:55 AM 1.49 0.40 - 1.50 mg/dL Final  88/85/7981 90:85 AM 1.53 (H) 0.40 - 1.50 mg/dL Final  02/28/2017 09:13 AM 1.57 (H) 0.76 - 1.27 mg/dL Final  90/86/7981 89:72 AM 1.76 (H) 0.40 - 1.50 mg/dL Final  88/90/7983 90:84 AM 1.30 (H) 0.61 - 1.24 mg/dL Final  94/83/7983 92:40 AM 1.36 0.40 - 1.50 mg/dL Final  87/76/7984 97:51 AM 1.56 (H) 0.50 - 1.35 mg/dL Final  87/77/7984 94:49 AM 1.43 (H) 0.50 - 1.35 mg/dL Final  87/78/7984 96:75 AM 1.46 (H) 0.50 - 1.35 mg/dL Final  87/79/7984 95:61 AM 1.52 (H) 0.50 - 1.35 mg/dL Final  87/80/7984 93:84 PM 1.70 (H) 0.50 - 1.35 mg/dL Final  87/80/7984 93:86 PM 1.55 (H) 0.50 - 1.35 mg/dL Final  87/80/7984 95:69 AM 1.26 0.50 - 1.35 mg/dL Final  87/81/7984 92:46 PM 1.30 0.50 - 1.35 mg/dL Final  87/81/7984 92:49 PM 1.23 0.50 - 1.35 mg/dL Final  87/81/7984 87:81 PM 1.00 0.50 - 1.35 mg/dL Final  87/81/7984 88:83 AM 1.20 0.50 - 1.35 mg/dL Final  87/81/7984 90:46 AM 1.00 0.50 - 1.35 mg/dL Final  87/81/7984 90:76 AM 1.00 0.50 - 1.35  mg/dL Final  87/81/7984 91:91 AM 1.20 0.50 - 1.35 mg/dL Final  87/81/7984 97:68 AM 1.24 0.50 - 1.35 mg/dL Final  87/82/7984 91:99 AM 1.19 0.50 - 1.35 mg/dL Final  87/84/7984 89:89 AM 1.3 0.4 - 1.5 mg/dL Final  93/70/7990 96:41 PM 0.9 0.4 - 1.5 mg/dL Final     PMHx:   Past Medical History:  Diagnosis Date   Allergy    Anemia    Asthma    Atherosclerosis of abdominal aorta (HCC) 02/26/2020   Seen on x-ray L-spine September 2021   BPH (benign prostatic hyperplasia)    CAD (coronary artery disease)    a. s/p MI 09/1993;  b. known CTO of RCA;  c. 06/2014 Cath: LM short/mod dzs, LAD 95ost, LCX nl, RI nl, RCA 100, EF 55%;  c. 06/2014 s/p CABG x 4: LIMA->LAD, VG->D1, VG->OM1, VG->Acute Marginal.   Carotid stenosis    a. Carotid US  1/17:  RICA 40-59%; LICA 60-79% // Carotid US  1/22: R 1-39; L 40-59; L VA aberrant flow; bilat subclavian stenosis // Carotid US  1/23: R 1-39, L 40-59; R subclavian stenosis   CKD (chronic kidney disease), stage III (HCC)    Constipation    in the past 2-3 weeks   Diabetes (HCC)    AODM   Diastolic dysfunction    a. 05/2015 Echo: EF 60-65%, LVH, Gr 1 DD, mild MR, mod dil RA/LA, mod TR, PASP .   Essential hypertension    GERD (gastroesophageal reflux disease)    History of kidney stones    Hyperlipidemia     Past Surgical History:  Procedure Laterality Date   ANGIOPLASTY  09/22/1993   POBA of RCA (Dr. FABIENE Pinion)   BACK SURGERY  2000   back fusion - Dr. Victory Gunnels   CARDIAC CATHETERIZATION  11/28/1998   silent occlusion of RCA w/collaterals from left coronary system, prox LAD w/40-50% eccentric narrowing, 1st diagonal with 70-80% eccentric narrowing, 85% narrowing of prox small OM1 (Dr. FABIENE Pinion)   CAROTID DOPPLER  07/2011   left subclavian (50-69%); right bulb (0-49%); RICA (normal); left mid-distal CCA (0-49%); left bulb/prox ICA (50-69%); left vertebral with abnormal antegrade flow   CORONARY ARTERY BYPASS GRAFT N/A 05/21/2014   Procedure:  CORONARY ARTERY BYPASS GRAFTING (CABG) x  four, using left internal mammary artery and right leg greater saphenous vein harvested endoscopically;  Surgeon: Elspeth JAYSON Millers, MD;  Location: [facility] OR;  Service: Open Heart Surgery;  Laterality: N/A;  INTRAOPERATIVE TRANSESOPHAGEAL ECHOCARDIOGRAM N/A 05/21/2014   Procedure: INTRAOPERATIVE TRANSESOPHAGEAL ECHOCARDIOGRAM;  Surgeon: Elspeth JAYSON Millers, MD;  Location: [facility] OR;  Service: Open Heart Surgery;  Laterality: N/A;   LEFT HEART CATHETERIZATION WITH CORONARY ANGIOGRAM N/A 05/19/2014   Procedure: LEFT HEART CATHETERIZATION WITH CORONARY ANGIOGRAM;  Surgeon: Ozell JONETTA Fell, MD;  Location: [facility] CATH LAB;  Service: Cardiovascular;  Laterality: N/A;   LUMBAR LAMINECTOMY/DECOMPRESSION MICRODISCECTOMY Left 09/23/2020   Procedure: Laminectomy and Foraminotomy - left - Lumbar three-Lumbar four;  Surgeon: Louis Shove, MD;  Location: [facility] OR;  Service: Neurosurgery;  Laterality: Left;   NASAL SINUS SURGERY  2010   NM MYOCAR PERF WALL MOTION  09/19/2009   bruce myoview - mild perfusion defect in basal inferior region (infarct/scar), EF 60%, low risk scan   RENAL DOPPLER  10/2011   SMA w/ 70-99% diameter reduction & high grade stenosis; R&L renals w/narrowing and increased velocities (60-99%), R kidney smaller than L   TRANSTHORACIC ECHOCARDIOGRAM  10/20/2012   EF 55-60%, moderate concentric hypertrophy, ventricular septum thickness increased, calcified MV annulus   VASECTOMY  1963    Family Hx:  Family History  Problem Relation Age of Onset   Uterine cancer Mother    Stroke Father    Diabetes Sister    Colon polyps Brother    Heart disease Brother    Colon cancer Neg Hx    Esophageal cancer Neg Hx    Rectal cancer Neg Hx    Stomach cancer Neg Hx     Social History:  reports that he quit smoking about 51 years ago. His smoking use included cigarettes. His smokeless tobacco use includes chew. He reports current alcohol use of about 3.0 standard drinks  of alcohol per week. He reports that he does not use drugs.  Allergies:  Allergies  Allergen Reactions   Exenatide Other (See Comments)    Exenatide, sold under the brand name Byetta among others, is a medication used to treat type 2 diabetes.   Linagliptin  Other (See Comments)    Tradjenta  (linagliptin ) is a brand-name tablet prescribed for type 2 diabetes.   Lasix  [Furosemide ] Other (See Comments)    Constipation    Medications: Prior to Admission medications   Medication Sig Start Date End Date Taking? Authorizing Provider  acetaminophen  (TYLENOL ) 325 MG tablet Take 650 mg by mouth every 6 (six) hours as needed for moderate pain (pain score 4-6).   Yes [provider]  alfuzosin  (UROXATRAL ) 10 MG 24 hr tablet Take 10 mg by mouth daily with breakfast.   Yes [provider]  amLODipine  (NORVASC ) 10 MG tablet TAKE 1 TABLET EVERY DAY 11/22/21  Yes Fell Ozell, MD  antiseptic oral rinse (BIOTENE) LIQD 15 mLs by Mouth Rinse route 4 (four) times daily as needed for dry mouth.   Yes [provider]  Ascorbic Acid  (VITAMIN C) 1000 MG tablet Take 1,000 mg by mouth daily.   Yes [provider]  aspirin  81 MG tablet Take 81 mg by mouth every morning.   Yes [provider]  carvedilol  (COREG ) 12.5 MG tablet Take 1 tablet (12.5 mg total) by mouth 2 (two) times daily with a meal. Patient taking differently: Take 25 mg by mouth 2 (two) times daily with a meal. Hold if SBP<105 or HR<60 07/27/22  Yes Odell Celinda Balo, MD  Cholecalciferol (VITAMIN D3 SUPER STRENGTH) 50 MCG (2000 UT) TABS Take 1 tablet by mouth daily.   Yes [provider]  cloNIDine (CATAPRES - DOSED IN  MG/24 HR) 0.1 mg/24hr patch Place 0.1 mg onto the skin every Friday.   Yes [provider]  empagliflozin  (JARDIANCE ) 10 MG TABS tablet Take 1 tablet (10 mg total) by mouth daily before breakfast. 10/01/23  Yes Amin, Ankit C, MD  finasteride  (PROSCAR ) 5 MG tablet Take 5 mg  by mouth daily. 09/20/23  Yes [provider]  furosemide  (LASIX ) 20 MG tablet Take 20 mg by mouth 2 (two) times daily.   Yes [provider]  insulin  lispro (HUMALOG KWIKPEN) 100 UNIT/ML KwikPen Inject 0-10 Units into the skin 3 (three) times daily. Sliding Scale: If BS<70 Call MD, IF BS is 71-150=0 units, 151-200=2 units, 201-250=4 units, 251-300=6 units, 301-350=8 units, 351-400=10 units, BS>400 Call MD   Yes [provider]  LANTUS  SOLOSTAR 100 UNIT/ML Solostar Pen Inject 8 Units into the skin at bedtime. 12/10/22  Yes [provider]  loratadine (CLARITIN) 10 MG tablet Take 10 mg by mouth daily.   Yes [provider]  LORazepam  (ATIVAN ) 0.5 MG tablet Take 1 tablet (0.5 mg total) by mouth every 12 (twelve) hours as needed. 09/27/23  Yes Amin, Ankit C, MD  olmesartan  (BENICAR ) 20 MG tablet Take 1 tablet (20 mg total) by mouth daily. 10/01/23  Yes Amin, Ankit C, MD  polyethylene glycol (MIRALAX  / GLYCOLAX ) 17 g packet Take 17 g by mouth daily as needed.   Yes [provider]  repaglinide  (PRANDIN ) 0.5 MG tablet Take 0.5 mg by mouth 3 (three) times daily before meals.   Yes [provider]  risperiDONE  (RISPERDAL ) 0.5 MG tablet Take 1 tablet (0.5 mg total) by mouth every 12 (twelve) hours. 09/27/23  Yes Amin, Ankit C, MD  senna (SENOKOT) 8.6 MG TABS tablet Take 1 tablet by mouth every evening.   Yes [provider]  sertraline  (ZOLOFT ) 100 MG tablet Take 100 mg by mouth daily. 09/21/23  Yes [provider]  fluticasone (FLONASE) 50 MCG/ACT nasal spray Place 1 spray into both nostrils daily. Patient not taking: Reported on 11/23/2023 09/02/23   [provider]  furosemide  (LASIX ) 40 MG tablet Take 1 tablet (40 mg total) by mouth 2 (two) times daily. Patient not taking: Reported on 11/23/2023 10/01/23   Caleen Burgess BROCKS, MD    I have reviewed the patient's current medications.  Labs:  Results for orders placed or performed  during the hospital encounter of 11/23/23 (from the past 48 hours)  CBC with Differential     Status: Abnormal   Collection Time: 11/23/23 10:55 AM  Result Value Ref Range   WBC 7.5 4.0 - 10.5 K/uL   RBC 3.65 (L) 4.22 - 5.81 MIL/uL   Hemoglobin 11.8 (L) 13.0 - 17.0 g/dL   HCT 62.7 (L) 60.9 - 47.9 %   MCV 101.9 (H) 80.0 - 100.0 fL   MCH 32.3 26.0 - 34.0 pg   MCHC 31.7 30.0 - 36.0 g/dL   RDW 87.3 88.4 - 84.4 %   Platelets 218 150 - 400 K/uL   nRBC 0.0 0.0 - 0.2 %   Neutrophils Relative % 59 %   Neutro Abs 4.5 1.7 - 7.7 K/uL   Lymphocytes Relative 21 %   Lymphs Abs 1.6 0.7 - 4.0 K/uL   Monocytes Relative 10 %   Monocytes Absolute 0.8 0.1 - 1.0 K/uL   Eosinophils Relative 8 %   Eosinophils Absolute 0.6 (H) 0.0 - 0.5 K/uL   Basophils Relative 1 %   Basophils Absolute 0.1 0.0 - 0.1 K/uL  Immature Granulocytes 1 %   Abs Immature Granulocytes 0.08 (H) 0.00 - 0.07 K/uL    Comment: Performed at [facility], [address], [place], [place] [address]  Comprehensive metabolic panel     Status: Abnormal   Collection Time: 11/23/23 10:55 AM  Result Value Ref Range   Sodium 139 135 - 145 mmol/L   Potassium 4.7 3.5 - 5.1 mmol/L   Chloride 110 98 - 111 mmol/L   CO2 19 (L) 22 - 32 mmol/L   Glucose, Bld 125 (H) 70 - 99 mg/dL    Comment: Glucose reference range applies only to samples taken after fasting for at least 8 hours.   BUN 107 (H) 8 - 23 mg/dL   Creatinine, Ser 7.78 (H) 0.61 - 1.24 mg/dL   Calcium  8.9 8.9 - 10.3 mg/dL   Total Protein 8.1 6.5 - 8.1 g/dL   Albumin  3.9 3.5 - 5.0 g/dL   AST 24 15 - 41 U/L   ALT 19 0 - 44 U/L   Alkaline Phosphatase 79 38 - 126 U/L   Total Bilirubin 0.7 0.0 - 1.2 mg/dL   GFR, Estimated 28 (L) >60 mL/min    Comment: (NOTE) Calculated using the CKD-EPI Creatinine Equation (2021)    Anion gap 10 5 - 15    Comment: Performed at [facility], [address], [place], [place] [address]  Procalcitonin     Status: None   Collection Time:  11/23/23 10:55 AM  Result Value Ref Range   Procalcitonin <0.10 ng/mL    Comment:        Interpretation: PCT (Procalcitonin) <= 0.5 ng/mL: Systemic infection (sepsis) is not likely. Local bacterial infection is possible. (NOTE)       Sepsis PCT Algorithm           Lower Respiratory Tract                                      Infection PCT Algorithm    ----------------------------     ----------------------------         PCT < 0.25 ng/mL                PCT < 0.10 ng/mL          Strongly encourage             Strongly discourage   discontinuation of antibiotics    initiation of antibiotics    ----------------------------     -----------------------------       PCT 0.25 - 0.50 ng/mL            PCT 0.10 - 0.25 ng/mL               OR       >80% decrease in PCT            Discourage initiation of                                            antibiotics      Encourage discontinuation           of antibiotics    ----------------------------     -----------------------------         PCT >= 0.50 ng/mL  PCT 0.26 - 0.50 ng/mL               AND        <80% decrease in PCT             Encourage initiation of                                             antibiotics       Encourage continuation           of antibiotics    ----------------------------     -----------------------------        PCT >= 0.50 ng/mL                  PCT > 0.50 ng/mL               AND         increase in PCT                  Strongly encourage                                      initiation of antibiotics    Strongly encourage escalation           of antibiotics                                     -----------------------------                                           PCT <= 0.25 ng/mL                                                 OR                                        > 80% decrease in PCT                                      Discontinue / Do not initiate                                              antibiotics  Performed at [facility], [address], [place], [place] [address]   Urinalysis, Routine w reflex microscopic -Urine, Clean Catch     Status: Abnormal   Collection Time: 11/23/23 10:56 AM  Result Value Ref Range   Color, Urine YELLOW YELLOW   APPearance CLEAR CLEAR   Specific Gravity, Urine 1.012 1.005 - 1.030   pH 5.0 5.0 - 8.0   Glucose, UA >=500 (A) NEGATIVE mg/dL   Hgb urine  dipstick NEGATIVE NEGATIVE   Bilirubin Urine NEGATIVE NEGATIVE   Ketones, ur NEGATIVE NEGATIVE mg/dL   Protein, ur NEGATIVE NEGATIVE mg/dL   Nitrite NEGATIVE NEGATIVE   Leukocytes,Ua NEGATIVE NEGATIVE   RBC / HPF 0-5 0 - 5 RBC/hpf   WBC, UA 0-5 0 - 5 WBC/hpf   Bacteria, UA NONE SEEN NONE SEEN   Squamous Epithelial / HPF 0-5 0 - 5 /HPF   Mucus PRESENT     Comment: Performed at [facility], [address], [place], [place] [address]  I-stat chem 8, ED (not at [facility], [facility] or [facility])     Status: Abnormal   Collection Time: 11/23/23 11:08 AM  Result Value Ref Range   Sodium 141 135 - 145 mmol/L   Potassium 4.8 3.5 - 5.1 mmol/L   Chloride 109 98 - 111 mmol/L   BUN 123 (H) 8 - 23 mg/dL   Creatinine, Ser 7.69 (H) 0.61 - 1.24 mg/dL   Glucose, Bld 879 (H) 70 - 99 mg/dL    Comment: Glucose reference range applies only to samples taken after fasting for at least 8 hours.   Calcium , Ion 1.27 1.15 - 1.40 mmol/L   TCO2 19 (L) 22 - 32 mmol/L   Hemoglobin 12.6 (L) 13.0 - 17.0 g/dL   HCT 62.9 (L) 60.9 - 47.9 %  I-Stat venous blood gas, ([facility] ED, [facility], [facility])     Status: Abnormal   Collection Time: 11/23/23 11:09 AM  Result Value Ref Range   pH, Ven 7.216 (L) 7.25 - 7.43   pCO2, Ven 47.4 44 - 60 mmHg   pO2, Ven 26 (LL) 32 - 45 mmHg   Bicarbonate 19.2 (L) 20.0 - 28.0 mmol/L   TCO2 21 (L) 22 - 32 mmol/L   O2 Saturation 37 %   Acid-base deficit 8.0 (H) 0.0 - 2.0 mmol/L   Sodium 142 135 - 145 mmol/L   Potassium 4.8 3.5 - 5.1 mmol/L   Calcium , Ion 1.26 1.15 - 1.40 mmol/L   HCT 36.0 (L) 39.0 -  52.0 %   Hemoglobin 12.2 (L) 13.0 - 17.0 g/dL   Sample type VENOUS    Comment NOTIFIED PHYSICIAN   I-Stat CG4 Lactic Acid     Status: None   Collection Time: 11/23/23 11:10 AM  Result Value Ref Range   Lactic Acid, Venous 1.2 0.5 - 1.9 mmol/L  Brain natriuretic peptide     Status: Abnormal   Collection Time: 11/23/23  2:30 PM  Result Value Ref Range   B Natriuretic Peptide 144.2 (H) 0.0 - 100.0 pg/mL    Comment: Performed at [facility], [address], [place], [place] [address]  CBG monitoring, ED     Status: Abnormal   Collection Time: 11/23/23  3:02 PM  Result Value Ref Range   Glucose-Capillary 103 (H) 70 - 99 mg/dL    Comment: Glucose reference range applies only to samples taken after fasting for at least 8 hours.  I-Stat CG4 Lactic Acid     Status: None   Collection Time: 11/23/23  3:18 PM  Result Value Ref Range   Lactic Acid, Venous 1.7 0.5 - 1.9 mmol/L     ROS:  Review of systems not obtained due to patient factors.  Physical Exam: Vitals:   11/23/23 1600 11/23/23 1725  BP: (!) 145/58 (!) 141/65  Pulse: 71 71  Resp: 18 18  Temp: 98.1 F (36.7 C) (!) 97.4 F (36.3 C)  SpO2: 100% 100%  General:  thin WM-  eating dinner without assist -  some confused  I am still working  HEENT: PERRLA, EOMI, mm moist  Neck: non JVD Heart: RRR Lungs: mostly clear Abdomen: soft, non tender Extremities: really no peripheral edema  Skin: warm and dry Neuro: confused-  easy to agitation   Assessment/Plan: 83 year old WM with DM, HTN and baseline stage 4 CKD-  now with second episode of A on CRF in 2 mos-  possible uremia 1.Renal- baseline GFR about 30-  second episode of A on CRF in the last 2 mos.  I am going to say that patient is no longer a candidate for ARB and SGLT2 as he is a set up for AKI.  Would hold these meds and not resume.  I am ok with some short term fluids but with BP high dont think that he is that dry- will likely advise to stop fluids tomorow.   As he is not a candidate for above meds I would not consider him a candidate for dialysis either -  mostly based on dementia  2. Hypertension/volume  - BP is adequate for situation-  placed on home meds minus the ARB 4. Anemia  - is actually not an issue at this time    Curtis DELENA Heman 11/23/2023, 5:40 PM

## 2023-11-23 NOTE — H&P (Addendum)
 History and Physical    Patient: Justin Salinas FMW:[id number] DOB: 01/11/1941 DOA: 11/23/2023 DOS: the patient was seen and examined on 11/23/2023 PCP: Kennyth Worth HERO, MD  Patient coming from: [facility] via EMS  Chief Complaint:  Chief Complaint  Patient presents with   elevated BUN/creatinine   HPI: Justin Salinas is a 83 y.o. male with medical history significant of CKD stage III, CAD, DM 2, chronic CHF with preserved EF, HTN, GERD, HLD, and dementia with behavioral disturbance who presents for abnormal labs. He is accompanied by his daughter.  History is obtained with the assistance of the patient's daughter present at bedside as well as review of records.  He makes note of pain in his kidneys, described as a hurting sensation in his back. His daughter mentions that recent blood work showed elevated kidney levels, leading to a recommendation for fluid administration. He was placed on fluids from Thursday to Saturday, but the treatment was interrupted when he became confused and removed the IV.  It was reported that outpatient labs revealed creatinine was elevated to 2.44 with BUN 127.  The provider at the facility noted that if he was unable to complete the IV fluid boluses that he should be admitted into the hospital for which he was brought.  He resides in an assisted living facility where he receives protein shakes and supplements to maintain nutrition, although he notes that the food quality is not to his liking.  Daughter notes that he has had fair appetite and seems to drink adequate fluids.  He has a history of pneumonia in April of this year for which he was hospitalized.  He reports a mild cough but denies any significant issues with swallowing, although he then admitted occasionally that he gets choked up while eating.   In the emergency department patient was noted to be afebrile with blood pressures elevated up to 196/166, and all other vital signs maintained.   Labs noted WBC 7.5, lactic acid 1.2, hemoglobin 12.2, CO2 19, BUN 107, and creatinine 2.21.  Urinalysis noted greater than 500 glucose without signs of infection.  Chest x-ray noted patchy right mid and lower lung opacities concerning for atelectasis or multifocal pneumonia.  Patient has been given 2 L of lactated Ringer 's, amlodipine  10 mg p.o., Augmentin , and azithromycin .  Review of Systems: As mentioned in the history of present illness. All other systems reviewed and are negative. Past Medical History:  Diagnosis Date   Allergy    Anemia    Asthma    Atherosclerosis of abdominal aorta (HCC) 02/26/2020   Seen on x-ray L-spine September 2021   BPH (benign prostatic hyperplasia)    CAD (coronary artery disease)    a. s/p MI 09/1993;  b. known CTO of RCA;  c. 06/2014 Cath: LM short/mod dzs, LAD 95ost, LCX nl, RI nl, RCA 100, EF 55%;  c. 06/2014 s/p CABG x 4: LIMA->LAD, VG->D1, VG->OM1, VG->Acute Marginal.   Carotid stenosis    a. Carotid US  1/17:  RICA 40-59%; LICA 60-79% // Carotid US  1/22: R 1-39; L 40-59; L VA aberrant flow; bilat subclavian stenosis // Carotid US  1/23: R 1-39, L 40-59; R subclavian stenosis   CKD (chronic kidney disease), stage III (HCC)    Constipation    in the past 2-3 weeks   Diabetes (HCC)    AODM   Diastolic dysfunction    a. 05/2015 Echo: EF 60-65%, LVH, Gr 1 DD, mild MR, mod dil RA/LA, mod TR, PASP .  Essential hypertension    GERD (gastroesophageal reflux disease)    History of kidney stones    Hyperlipidemia    Past Surgical History:  Procedure Laterality Date   ANGIOPLASTY  09/22/1993   POBA of RCA (Dr. FABIENE Pinion)   BACK SURGERY  2000   back fusion - Dr. Victory Gunnels   CARDIAC CATHETERIZATION  11/28/1998   silent occlusion of RCA w/collaterals from left coronary system, prox LAD w/40-50% eccentric narrowing, 1st diagonal with 70-80% eccentric narrowing, 85% narrowing of prox small OM1 (Dr. FABIENE Pinion)   CAROTID DOPPLER  07/2011   left  subclavian (50-69%); right bulb (0-49%); RICA (normal); left mid-distal CCA (0-49%); left bulb/prox ICA (50-69%); left vertebral with abnormal antegrade flow   CORONARY ARTERY BYPASS GRAFT N/A 05/21/2014   Procedure: CORONARY ARTERY BYPASS GRAFTING (CABG) x  four, using left internal mammary artery and right leg greater saphenous vein harvested endoscopically;  Surgeon: Elspeth JAYSON Millers, MD;  Location: [facility] OR;  Service: Open Heart Surgery;  Laterality: N/A;   INTRAOPERATIVE TRANSESOPHAGEAL ECHOCARDIOGRAM N/A 05/21/2014   Procedure: INTRAOPERATIVE TRANSESOPHAGEAL ECHOCARDIOGRAM;  Surgeon: Elspeth JAYSON Millers, MD;  Location: [facility] OR;  Service: Open Heart Surgery;  Laterality: N/A;   LEFT HEART CATHETERIZATION WITH CORONARY ANGIOGRAM N/A 05/19/2014   Procedure: LEFT HEART CATHETERIZATION WITH CORONARY ANGIOGRAM;  Surgeon: Ozell JONETTA Fell, MD;  Location: [facility] CATH LAB;  Service: Cardiovascular;  Laterality: N/A;   LUMBAR LAMINECTOMY/DECOMPRESSION MICRODISCECTOMY Left 09/23/2020   Procedure: Laminectomy and Foraminotomy - left - Lumbar three-Lumbar four;  Surgeon: Gunnels Victory, MD;  Location: [facility] OR;  Service: Neurosurgery;  Laterality: Left;   NASAL SINUS SURGERY  2010   NM MYOCAR PERF WALL MOTION  09/19/2009   bruce myoview - mild perfusion defect in basal inferior region (infarct/scar), EF 60%, low risk scan   RENAL DOPPLER  10/2011   SMA w/ 70-99% diameter reduction & high grade stenosis; R&L renals w/narrowing and increased velocities (60-99%), R kidney smaller than L   TRANSTHORACIC ECHOCARDIOGRAM  10/20/2012   EF 55-60%, moderate concentric hypertrophy, ventricular septum thickness increased, calcified MV annulus   VASECTOMY  1963   Social History:  reports that he quit smoking about 51 years ago. His smoking use included cigarettes. His smokeless tobacco use includes chew. He reports current alcohol use of about 3.0 standard drinks of alcohol per week. He reports that he does not use  drugs.  Allergies  Allergen Reactions   Exenatide Other (See Comments)    Exenatide, sold under the brand name Byetta among others, is a medication used to treat type 2 diabetes.   Linagliptin  Other (See Comments)    Tradjenta  (linagliptin ) is a brand-name tablet prescribed for type 2 diabetes.   Lasix  [Furosemide ] Other (See Comments)    Constipation    Family History  Problem Relation Age of Onset   Uterine cancer Mother    Stroke Father    Diabetes Sister    Colon polyps Brother    Heart disease Brother    Colon cancer Neg Hx    Esophageal cancer Neg Hx    Rectal cancer Neg Hx    Stomach cancer Neg Hx     Prior to Admission medications   Medication Sig Start Date End Date Taking? Authorizing Provider  acetaminophen  (TYLENOL ) 325 MG tablet Take 650 mg by mouth every 6 (six) hours as needed for moderate pain (pain score 4-6).    [provider]  alfuzosin  (UROXATRAL ) 10 MG 24 hr tablet Take 10 mg  by mouth daily with breakfast.    [provider]  amLODipine  (NORVASC ) 10 MG tablet TAKE 1 TABLET EVERY DAY 11/22/21   Wonda Sharper, MD  antiseptic oral rinse (BIOTENE) LIQD 15 mLs by Mouth Rinse route 4 (four) times daily as needed for dry mouth.    [provider]  Ascorbic Acid  (VITAMIN C) 1000 MG tablet Take 1,000 mg by mouth daily.    [provider]  aspirin  81 MG tablet Take 81 mg by mouth daily.    [provider]  carvedilol  (COREG ) 12.5 MG tablet Take 1 tablet (12.5 mg total) by mouth 2 (two) times daily with a meal. Patient taking differently: Take 25 mg by mouth 2 (two) times daily with a meal. Hold if SBP<105 or HR<60 07/27/22   Odell Celinda Balo, MD  Cholecalciferol (VITAMIN D3 SUPER STRENGTH) 50 MCG (2000 UT) TABS Take 1 tablet by mouth daily.    [provider]  cloNIDine (CATAPRES - DOSED IN MG/24 HR) 0.2 mg/24hr patch Place 0.2 mg onto the skin once a week.    [provider]  empagliflozin  (JARDIANCE )  10 MG TABS tablet Take 1 tablet (10 mg total) by mouth daily before breakfast. 10/01/23   Amin, Ankit C, MD  erythromycin ophthalmic ointment Place 1 Application into both eyes in the morning, at noon, in the evening, and at bedtime. 7 Days 09/24/23   [provider]  finasteride  (PROSCAR ) 5 MG tablet Take 5 mg by mouth daily. 09/20/23   [provider]  fluticasone (FLONASE) 50 MCG/ACT nasal spray Place 1 spray into both nostrils daily. 09/02/23   [provider]  furosemide  (LASIX ) 40 MG tablet Take 1 tablet (40 mg total) by mouth 2 (two) times daily. 10/01/23   Amin, Ankit C, MD  guaiFENesin  (MUCINEX ) 600 MG 12 hr tablet Take 600 mg by mouth 2 (two) times daily.    [provider]  insulin  lispro (HUMALOG KWIKPEN) 100 UNIT/ML KwikPen Inject 0-10 Units into the skin 3 (three) times daily. Sliding Scale: If BS<70 Call MD, IF BS is 71-150=0 units, 151-200=2 units, 201-250=4 units, 251-300=6 units, 301-350=8 units, 351-400=10 units, BS>400 Call MD    [provider]  LANTUS  SOLOSTAR 100 UNIT/ML Solostar Pen Inject 8 Units into the skin at bedtime. 12/10/22   [provider]  loratadine (CLARITIN) 10 MG tablet Take 10 mg by mouth daily.    [provider]  LORazepam  (ATIVAN ) 0.5 MG tablet Take 1 tablet (0.5 mg total) by mouth every 12 (twelve) hours as needed. 09/27/23   Amin, Ankit C, MD  olmesartan  (BENICAR ) 20 MG tablet Take 1 tablet (20 mg total) by mouth daily. 10/01/23   Amin, Ankit C, MD  polyethylene glycol (MIRALAX  / GLYCOLAX ) 17 g packet Take 17 g by mouth daily as needed.    [provider]  repaglinide  (PRANDIN ) 0.5 MG tablet Take 0.5 mg by mouth 3 (three) times daily before meals.    [provider]  risperiDONE  (RISPERDAL ) 0.5 MG tablet Take 1 tablet (0.5 mg total) by mouth every 12 (twelve) hours. 09/27/23   Amin, Ankit C, MD  senna (SENOKOT) 8.6 MG TABS tablet Take 1 tablet by mouth every evening.    [provider]  sertraline  (ZOLOFT ) 100 MG tablet Take 100 mg by mouth daily. 09/21/23   [provider]    Physical Exam: Vitals:   11/23/23 1320 11/23/23 1321 11/23/23 1323 11/23/23 1323  BP:   (!) 159/59  Pulse: 71 71 72   Resp: 17 16 19    Temp:    97.9 F (36.6 C)  TempSrc:    Oral  SpO2: 100% 100% 100%   Weight:      Height:       Constitutional: Elderly male currently in NAD, calm, comfortable Eyes: PERRL, lids and conjunctivae normal ENMT: Mucous membranes are moist. Normal dentition.  Hearing aids in place Neck: normal, supple  Respiratory: clear to auscultation bilaterally, no wheezing, no crackles. Normal respiratory effort. No accessory muscle use.  Cardiovascular: Regular rate and rhythm, no murmurs / rubs / gallops. No extremity edema. 2+ pedal pulses. No carotid bruits.  Abdomen: no tenderness, no masses palpated. . Bowel sounds positive.  Musculoskeletal: no clubbing / cyanosis. Normal muscle tone.  Skin: no rashes, lesions, ulcers.    Neurologic: CN 2-12 grossly intact. Strength 5/5 in all 4.  Psychiatric: Alert and oriented x person and place. Normal mood.   Data Reviewed:  EKG reveals sinus rhythm 73 bpm with QTc 471.  Reviewed labs, imaging, and pertinent records as documented.  Assessment and Plan:  Uremia Acute kidney injury superimposed on chronic kidney disease stage III On admission creatinine elevated up to 2.21 with BUN 107 which is improved from prior check in the outpatient setting where creatinine was 2.44.  He had been given IV fluids, but got confused and removed IVs for which he was recommended to come to the hospital to continue treatment.  Baseline creatinine previously noted to be around 1.8.  Urinalysis was positive for glucose but negative for any signs or infection.  He had been receiving IV fluids at the facility without improvement.  Patient has been given 2 L of IV fluids.  Patient is followed by [facility] in  outpatient setting for which daughter request consult. - Admit to a MedSurg bed - Strict intake and output - Avoid possible nephrotoxic agents - Continue normal saline IV fluids at 75 mL/h - Recheck kidney function in a.m. - Nephrology consulted at the request of family.  Suspected community-acquired pneumonia Patient does admit to having a intermittent cough.  Chest x-ray noted concern for patchy right mid and bilateral lower lung opacities concerning for atelectasis or multifocal pneumonia.  White blood cell count was within limits.  Question possibility of aspiration versus bacterial pneumonia versus atelectasis versus less likely edema. Patient had been started on Augmentin  and azithromycin .   - Incentive spirometry and flutter valve - Check procalcitonin and BNP - Change antibiotics to Rocephin  and azithromycin .  De-escalate when deemed medically appropriate  Essential hypertension On admission blood pressures initially elevated up to 184/72. - Continue Coreg , clonidine patch, and amlodipine  - Held olmesartan  due to AKI - Hydralazine  IV as needed  Controlled diabetes mellitus type 2, with long-term use of insulin  Last hemoglobin A1c noted to be 7 when checked on 4/24. - Hypoglycemic protocols - Held Jardiance  and prandial  - Pharmacy substitution of Semglee  8 units nightly - CBGs before every meal with sensitive SSI  Macrocytic anemia Chronic.  Hemoglobin 11.8-> 12.6-> 12.2 with MCV 101.9.  B12 levels were at the lower end of normal when previously checked.  No reports of bleeding - Recheck CBC tomorrow morning  Heart failure with preserved EF Chronic.  Patient appears to be fairly euvolemic on physical exam.  Last echocardiogram noted EF to be 65% with grade 1 diastolic dysfunction. - Daily weights - Continue to monitor fluid status while receiving IV hydration  Dementia with behavioral disturbance  Patient noted to have  poor short-term memory and issues with sundowning and  agitation - Delirium precautions - Continue Seroquel  BPH - Continue finasteride    DVT prophylaxis: Heparin   Advance Care Planning:   Code Status: Full Code    Consults: None  Family Communication: Daughter updated at bedside Severity of Illness: The appropriate patient status for this patient is INPATIENT. Inpatient status is judged to be reasonable and necessary in order to provide the required intensity of service to ensure the patient's safety. The patient's presenting symptoms, physical exam findings, and initial radiographic and laboratory data in the context of their chronic comorbidities is felt to place them at high risk for further clinical deterioration. Furthermore, it is not anticipated that the patient will be medically stable for discharge from the hospital within 2 midnights of admission.   * I certify that at the point of admission it is my clinical judgment that the patient will require inpatient hospital care spanning beyond 2 midnights from the point of admission due to high intensity of service, high risk for further deterioration and high frequency of surveillance required.*  Author: Maximino DELENA Sharps, MD 11/23/2023 2:03 PM  For on call review [email or web address].

## 2023-11-23 NOTE — ED Notes (Signed)
 Xray at [facility]

## 2023-11-24 DIAGNOSIS — N184 Chronic kidney disease, stage 4 (severe): Secondary | ICD-10-CM

## 2023-11-24 DIAGNOSIS — E1122 Type 2 diabetes mellitus with diabetic chronic kidney disease: Secondary | ICD-10-CM

## 2023-11-24 DIAGNOSIS — I5032 Chronic diastolic (congestive) heart failure: Secondary | ICD-10-CM | POA: Diagnosis not present

## 2023-11-24 DIAGNOSIS — N179 Acute kidney failure, unspecified: Secondary | ICD-10-CM | POA: Diagnosis not present

## 2023-11-24 DIAGNOSIS — Z794 Long term (current) use of insulin: Secondary | ICD-10-CM

## 2023-11-24 DIAGNOSIS — N400 Enlarged prostate without lower urinary tract symptoms: Secondary | ICD-10-CM | POA: Diagnosis not present

## 2023-11-24 LAB — GLUCOSE, CAPILLARY
Glucose-Capillary: 118 mg/dL — ABNORMAL HIGH (ref 70–99)
Glucose-Capillary: 240 mg/dL — ABNORMAL HIGH (ref 70–99)
Glucose-Capillary: 149 mg/dL — ABNORMAL HIGH (ref 70–99)
Glucose-Capillary: 175 mg/dL — ABNORMAL HIGH (ref 70–99)

## 2023-11-24 LAB — BASIC METABOLIC PANEL WITH GFR
Anion gap: 8 (ref 5–15)
BUN: 82 mg/dL — ABNORMAL HIGH (ref 8–23)
CO2: 18 mmol/L — ABNORMAL LOW (ref 22–32)
Calcium: 8 mg/dL — ABNORMAL LOW (ref 8.9–10.3)
Chloride: 112 mmol/L — ABNORMAL HIGH (ref 98–111)
Creatinine, Ser: 1.79 mg/dL — ABNORMAL HIGH (ref 0.61–1.24)
GFR, Estimated: 37 mL/min — ABNORMAL LOW (ref >60)
Glucose, Bld: 131 mg/dL — ABNORMAL HIGH (ref 70–99)
Potassium: 4.4 mmol/L (ref 3.5–5.1)
Sodium: 138 mmol/L (ref 135–145)

## 2023-11-24 LAB — CBC
HCT: 29 % — ABNORMAL LOW (ref 39.0–52.0)
Hemoglobin: 9.6 g/dL — ABNORMAL LOW (ref 13.0–17.0)
MCH: 33.1 pg (ref 26.0–34.0)
MCHC: 33.1 g/dL (ref 30.0–36.0)
MCV: 100 fL (ref 80.0–100.0)
Platelets: 159 10*3/uL (ref 150–400)
RBC: 2.9 MIL/uL — ABNORMAL LOW (ref 4.22–5.81)
RDW: 12.5 % (ref 11.5–15.5)
WBC: 5.9 10*3/uL (ref 4.0–10.5)
nRBC: 0 % (ref 0.0–0.2)

## 2023-11-24 MED ORDER — CARVEDILOL 12.5 MG PO TABS: 25.0000 mg | ORAL_TABLET | Freq: Two times a day (BID) | ORAL | Status: DC

## 2023-11-24 MED ORDER — RISPERIDONE 0.5 MG PO TABS
0.5000 mg | ORAL_TABLET | Freq: Two times a day (BID) | ORAL | Status: DC
  Administered 2023-11-24 – 2023-11-25 (×2): 0.5 mg via ORAL
  Filled 2023-11-24 (×4): qty 1

## 2023-11-24 NOTE — Progress Notes (Signed)
 Incentive spirometry and flutter valve introduced with teach back.

## 2023-11-24 NOTE — Plan of Care (Signed)

## 2023-11-24 NOTE — Progress Notes (Addendum)
 Pt had $711 of mixed bills on his person. Valuables taken to security by charge RN. Pt's  daughter Justin Salinas notified that pt values taken to security.

## 2023-11-24 NOTE — Assessment & Plan Note (Signed)
 11-24-2023 B12 lower normal in 07-2022. Continue oral B12.

## 2023-11-24 NOTE — Hospital Course (Addendum)
 HPI:  Justin Salinas is a 83 y.o. male with medical history significant of CKD stage III, CAD, DM 2, chronic CHF with preserved EF, HTN, GERD, HLD, and dementia with behavioral disturbance who presents for abnormal labs. He is accompanied by his daughter.  History is obtained with the assistance of the patient's daughter present at bedside as well as review of records.   He makes note of pain in his kidneys, described as a hurting sensation in his back. His daughter mentions that recent blood work showed elevated kidney levels, leading to a recommendation for fluid administration. He was placed on fluids from Thursday to Saturday, but the treatment was interrupted when he became confused and removed the IV.  It was reported that outpatient labs revealed creatinine was elevated to 2.44 with BUN 127.  The provider at the facility noted that if he was unable to complete the IV fluid boluses that he should be admitted into the hospital for which he was brought.   He resides in an assisted living facility where he receives protein shakes and supplements to maintain nutrition, although he notes that the food quality is not to his liking.  Daughter notes that he has had fair appetite and seems to drink adequate fluids.   He has a history of pneumonia in April of this year for which he was hospitalized.  He reports a mild cough but denies any significant issues with swallowing, although he then admitted occasionally that he gets choked up while eating.    In the emergency department patient was noted to be afebrile with blood pressures elevated up to 196/166, and all other vital signs maintained.  Labs noted WBC 7.5, lactic acid 1.2, hemoglobin 12.2, CO2 19, BUN 107, and creatinine 2.21.  Urinalysis noted greater than 500 glucose without signs of infection.  Chest x-ray noted patchy right mid and lower lung opacities concerning for atelectasis or multifocal pneumonia.  Patient has been given 2 L of lactated  "Ringer's" , amlodipine  10 mg p.o., Augmentin , and azithromycin .  Significant Events: Admitted 11/23/2023 for AKI on Ckd stage 4   Admission Labs: Na 141, K 4.8, CO2 of 19, BUN 107, Scr 2.21, glu 125 WBC 7.5, HgB 11.8, Plt 218 BNP 144 Procal <0.10  Admission Imaging Studies: CXR Patchy right mid and bilateral lower lung opacities, which may represent atelectasis or multifocal infection.  Significant Labs:   Significant Imaging Studies:   Antibiotic Therapy: Anti-infectives (From admission, onward)    Start     Dose/Rate Route Frequency Ordered Stop   11/23/23 1430  cefTRIAXone  (ROCEPHIN ) 2 g in sodium chloride  0.9 % 100 mL IVPB        2 g 200 mL/hr over 30 Minutes Intravenous Every 24 hours 11/23/23 1419     11/23/23 1230  amoxicillin -clavulanate (AUGMENTIN ) 875-125 MG per tablet 1 tablet        1 tablet Oral  Once 11/23/23 1224 11/23/23 1237   11/23/23 1230  azithromycin  (ZITHROMAX ) tablet 500 mg        500 mg Oral  Once 11/23/23 1225 11/23/23 1237       Procedures:   Consultants: neprhology

## 2023-11-24 NOTE — Assessment & Plan Note (Signed)
 11-24-2023 BUN down to 82 today. Was 107. Remains on IVF. Asked RN to place condom catheter to get more accurate I&Os. Scr 1.79 today, was 2.21 yesterday.  Monitor BUN/Scr. If stable, can DC him back to ALF and he can f/u with outpatient nephrology. Nephrology does not want to give any more IVF.

## 2023-11-24 NOTE — Progress Notes (Signed)
 PROGRESS NOTE    Justin Salinas  FMW:[id number] DOB: June 14, 1940 DOA: 11/23/2023 PCP: Kennyth Worth HERO, MD  Subjective: Pt seen and examined. No family at bedside. Nephrology notes reviewed and appreciated.   BUN down to 82 today. Was 107. Remains on IVF. Asked RN to place condom catheter to get more accurate I&Os.  Scr 1.79 today, was 2.21 yesterday.   Hospital Course: HPI:  Justin Salinas is a 83 y.o. male with medical history significant of CKD stage III, CAD, DM 2, chronic CHF with preserved EF, HTN, GERD, HLD, and dementia with behavioral disturbance who presents for abnormal labs. He is accompanied by his daughter.  History is obtained with the assistance of the patient's daughter present at bedside as well as review of records.   He makes note of pain in his kidneys, described as a hurting sensation in his back. His daughter mentions that recent blood work showed elevated kidney levels, leading to a recommendation for fluid administration. He was placed on fluids from Thursday to Saturday, but the treatment was interrupted when he became confused and removed the IV.  It was reported that outpatient labs revealed creatinine was elevated to 2.44 with BUN 127.  The provider at the facility noted that if he was unable to complete the IV fluid boluses that he should be admitted into the hospital for which he was brought.   He resides in an assisted living facility where he receives protein shakes and supplements to maintain nutrition, although he notes that the food quality is not to his liking.  Daughter notes that he has had fair appetite and seems to drink adequate fluids.   He has a history of pneumonia in April of this year for which he was hospitalized.  He reports a mild cough but denies any significant issues with swallowing, although he then admitted occasionally that he gets choked up while eating.    In the emergency department patient was noted to be afebrile with blood  pressures elevated up to 196/166, and all other vital signs maintained.  Labs noted WBC 7.5, lactic acid 1.2, hemoglobin 12.2, CO2 19, BUN 107, and creatinine 2.21.  Urinalysis noted greater than 500 glucose without signs of infection.  Chest x-ray noted patchy right mid and lower lung opacities concerning for atelectasis or multifocal pneumonia.  Patient has been given 2 L of lactated Ringer 's, amlodipine  10 mg p.o., Augmentin , and azithromycin .  Significant Events: Admitted 11/23/2023 for AKI on Ckd stage 4   Admission Labs: Na 141, K 4.8, CO2 of 19, BUN 107, Scr 2.21, glu 125 WBC 7.5, HgB 11.8, Plt 218 BNP 144 Procal <0.10  Admission Imaging Studies: CXR Patchy right mid and bilateral lower lung opacities, which may represent atelectasis or multifocal infection.  Significant Labs:   Significant Imaging Studies:   Antibiotic Therapy: Anti-infectives (From admission, onward)    Start     Dose/Rate Route Frequency Ordered Stop   11/23/23 1430  cefTRIAXone  (ROCEPHIN ) 2 g in sodium chloride  0.9 % 100 mL IVPB        2 g 200 mL/hr over 30 Minutes Intravenous Every 24 hours 11/23/23 1419     11/23/23 1230  amoxicillin -clavulanate (AUGMENTIN ) 875-125 MG per tablet 1 tablet        1 tablet Oral  Once 11/23/23 1224 11/23/23 1237   11/23/23 1230  azithromycin  (ZITHROMAX ) tablet 500 mg        500 mg Oral  Once 11/23/23 1225 11/23/23 1237  Procedures:   Consultants: neprhology    Assessment and Plan: * Acute kidney injury superimposed on stage 4 chronic kidney disease (HCC) 11-24-2023 BUN down to 82 today. Was 107. Remains on IVF. Asked RN to place condom catheter to get more accurate I&Os. Scr 1.79 today, was 2.21 yesterday.  Monitor BUN/Scr. If stable, can DC him back to ALF and he can f/u with outpatient nephrology. Nephrology does not want to give any more IVF.  Dementia with behavioral disturbance (HCC) 11-24-2023 chronic. Continue zoloft  and riperdal.  Macrocytic  anemia 11-24-2023 B12 lower normal in 07-2022. Continue oral B12.  Chronic diastolic CHF (congestive heart failure) (HCC) 11-24-2023 not a candidate for ARB/entresto/farxiga due to CKD. Per nephrology, no longer a candidate for ARB and SGLT2 as he is a set up for AKI.. will discontinue them from his home meds list so that are not accidentally continued at discharge.  Community acquired pneumonia-resolved as of 11/24/2023 11-24-2023 I don't think he has pneumonia. Procal is negative. Not hypoxic. I think he has atelectasis rather than pneumonia. Pneumonia rule out.   DVT prophylaxis: heparin  injection 5,000 Units Start: 11/23/23 1430    Code Status: Full Code Family Communication: no family at bedside Disposition Plan: return to ALF Reason for continuing need for hospitalization: monitor BUN/Scr  Objective: Vitals:   11/24/23 0031 11/24/23 0034 11/24/23 0410 11/24/23 0601  BP: (!) 147/68 (!) 147/68 137/60   Pulse: 65 65 70   Resp: 19 19 19    Temp: (!) 97.4 F (36.3 C) (!) 97.4 F (36.3 C) (!) 97.4 F (36.3 C)   TempSrc:  Oral    SpO2: 99% 99% 99%   Weight:   79.4 kg 75.3 kg  Height:        Intake/Output Summary (Last 24 hours) at 11/24/2023 1147 Last data filed at 11/24/2023 0825 Gross per 24 hour  Intake 2000 ml  Output 210 ml  Net 1790 ml   Filed Weights   11/23/23 1117 11/24/23 0410 11/24/23 0601  Weight: 79.4 kg 79.4 kg 75.3 kg    Examination:  Physical Exam Vitals reviewed.  Constitutional:      General: He is not in acute distress.    Appearance: He is not toxic-appearing or diaphoretic.  HENT:     Head: Normocephalic and atraumatic.   Eyes:     General: No scleral icterus.   Cardiovascular:     Rate and Rhythm: Normal rate and regular rhythm.  Pulmonary:     Effort: Pulmonary effort is normal. No respiratory distress.     Breath sounds: Normal breath sounds. No wheezing.     Comments: Lying flat in bed. No distress Abdominal:     General: Abdomen  is flat. There is no distension.     Palpations: Abdomen is soft.   Musculoskeletal:     Right lower leg: No edema.     Left lower leg: No edema.   Skin:    General: Skin is warm.     Capillary Refill: Capillary refill takes less than 2 seconds.   Neurological:     Mental Status: He is alert. He is disoriented.     Comments: Knew he was in the hospital but does not know name of hospital. Thought he was in [place], [place].    Data Reviewed: I have personally reviewed following labs and imaging studies  CBC: Recent Labs  Lab 11/23/23 1055 11/23/23 1108 11/23/23 1109 11/24/23 0443  WBC 7.5  --   --  5.9  NEUTROABS 4.5  --   --   --   HGB 11.8* 12.6* 12.2* 9.6*  HCT 37.2* 37.0* 36.0* 29.0*  MCV 101.9*  --   --  100.0  PLT 218  --   --  159   Basic Metabolic Panel: Recent Labs  Lab 11/23/23 1055 11/23/23 1108 11/23/23 1109 11/24/23 0443  NA 139 141 142 138  K 4.7 4.8 4.8 4.4  CL 110 109  --  112*  CO2 19*  --   --  18*  GLUCOSE 125* 120*  --  131*  BUN 107* 123*  --  82*  CREATININE 2.21* 2.30*  --  1.79*  CALCIUM  8.9  --   --  8.0*   GFR: Estimated Creatinine Clearance: 30.2 mL/min (A) (by C-G formula based on SCr of 1.79 mg/dL (H)). Liver Function Tests: Recent Labs  Lab 11/23/23 1055  AST 24  ALT 19  ALKPHOS 79  BILITOT 0.7  PROT 8.1  ALBUMIN  3.9   BNP (last 3 results) Recent Labs    11/23/23 1430  BNP 144.2*   CBG: Recent Labs  Lab 11/23/23 1502 11/23/23 1755 11/23/23 2124 11/24/23 0854 11/24/23 1142  GLUCAP 103* 103* 207* 118* 240*   Sepsis Labs: Recent Labs  Lab 11/23/23 1055 11/23/23 1110 11/23/23 1518  PROCALCITON <0.10  --   --   LATICACIDVEN  --  1.2 1.7   Radiology Studies: DG Chest Portable 1 View Result Date: 11/23/2023 CLINICAL DATA:  Pneumonia EXAM: PORTABLE CHEST 1 VIEW COMPARISON:  Chest radiograph dated 09/24/2023 FINDINGS: Normal lung volumes. Patchy right mid and bilateral lower lung opacities. No pleural effusion  or pneumothorax. The heart size and mediastinal contours are within normal limits. Median sternotomy wires are nondisplaced. IMPRESSION: Patchy right mid and bilateral lower lung opacities, which may represent atelectasis or multifocal infection. Electronically Signed   By: Limin  Xu M.D.   On: 11/23/2023 11:49    Scheduled Meds:  alfuzosin   10 mg Oral Q breakfast   amLODipine   10 mg Oral Daily   aspirin  EC  81 mg Oral Daily   carvedilol   25 mg Oral BID WC   [START ON 11/29/2023] cloNIDine  0.1 mg Transdermal Q Fri   finasteride   5 mg Oral Daily   guaiFENesin   600 mg Oral BID   heparin   5,000 Units Subcutaneous Q8H   insulin  aspart  0-9 Units Subcutaneous TID WC   insulin  glargine-yfgn  8 Units Subcutaneous QHS   risperiDONE   0.5 mg Oral Q12H   sertraline   100 mg Oral Daily   sodium chloride  flush  3 mL Intravenous Q12H   Continuous Infusions:   LOS: 1 day   Time spent: 55 minutes  Camellia Door, DO  [facility]  11/24/2023, 11:47 AM

## 2023-11-24 NOTE — Assessment & Plan Note (Signed)
 11-24-2023 chronic. Continue zoloft  and riperdal.

## 2023-11-24 NOTE — Progress Notes (Signed)
   Met with pt's dtr Alisa and gave her update. Will restart risperdal  bid to help with agitation. If risperdal  does not work, consider change to seroquel. As last resort IV ativan . Discussed DC to ALF tomorrow. Dtr is agreeable. She can pick up pt but in late afternoon due to dtr work schedule.  Camellia Door, DO [facility]

## 2023-11-24 NOTE — Assessment & Plan Note (Signed)
 11-24-2023 not a candidate for ARB/entresto/farxiga due to CKD. Per nephrology, no longer a candidate for ARB and SGLT2 as he is a set up for AKI.. will discontinue them from his home meds list so that are not accidentally continued at discharge.

## 2023-11-24 NOTE — Plan of Care (Signed)
  Problem: Fluid Volume: Goal: Ability to maintain a balanced intake and output will improve Outcome: Progressing   Problem: Health Behavior/Discharge Planning: Goal: Ability to manage health-related needs will improve Outcome: Progressing   Problem: Nutritional: Goal: Maintenance of adequate nutrition will improve Outcome: Progressing   Problem: Skin Integrity: Goal: Risk for impaired skin integrity will decrease Outcome: Progressing   Problem: Tissue Perfusion: Goal: Adequacy of tissue perfusion will improve Outcome: Progressing   Problem: Clinical Measurements: Goal: Respiratory complications will improve Outcome: Progressing   Problem: Clinical Measurements: Goal: Cardiovascular complication will be avoided Outcome: Progressing   Problem: Activity: Goal: Risk for activity intolerance will decrease Outcome: Progressing   Problem: Nutrition: Goal: Adequate nutrition will be maintained Outcome: Progressing   Problem: Coping: Goal: Level of anxiety will decrease Outcome: Progressing   Problem: Elimination: Goal: Will not experience complications related to urinary retention Outcome: Progressing   Problem: Pain Managment: Goal: General experience of comfort will improve and/or be controlled Outcome: Progressing   Problem: Safety: Goal: Ability to remain free from injury will improve Outcome: Progressing   Problem: Skin Integrity: Goal: Risk for impaired skin integrity will decrease Outcome: Progressing

## 2023-11-24 NOTE — Assessment & Plan Note (Signed)
 11-24-2023 I don't think he has pneumonia. Procal is negative. Not hypoxic. I think he has atelectasis rather than pneumonia. Pneumonia rule out.

## 2023-11-24 NOTE — Progress Notes (Signed)
 Subjective:  amount of urine not recorded but many voids-  BUN/CRT both down  Objective Vital signs in last 24 hours: Vitals:   11/24/23 0031 11/24/23 0034 11/24/23 0410 11/24/23 0601  BP: (!) 147/68 (!) 147/68 137/60   Pulse: 65 65 70   Resp: 19 19 19    Temp: (!) 97.4 F (36.3 C) (!) 97.4 F (36.3 C) (!) 97.4 F (36.3 C)   TempSrc:  Oral    SpO2: 99% 99% 99%   Weight:   79.4 kg 75.3 kg  Height:       Weight change:   Intake/Output Summary (Last 24 hours) at 11/24/2023 1105 Last data filed at 11/24/2023 0825 Gross per 24 hour  Intake 2000 ml  Output 210 ml  Net 1790 ml    Assessment/Plan: 83 year old WM with DM, HTN and baseline stage 4 CKD-  now with second episode of A on CRF in 2 mos-  possible uremia 1.Renal- baseline GFR about 30-  second episode of A on CRF in the last 2 mos.  I am going to say that patient is no longer a candidate for ARB and SGLT2 as he is a set up for AKI.  Would hold these meds and not resume.  I am ok with some short term fluids but with BP high dont think that he is that dry- will  stop fluids.  As he is not a candidate for above meds I would not consider him a candidate for dialysis either -  mostly based on dementia but thankfully that has not needed discussion .   2. Hypertension/volume  - BP is adequate for situation-  placed on home meds minus the ARB 4. Anemia  - was actually not an issue - did appear to have an acute drop from yesterday to today-  not sure any obvious bleeding   Creatinine really at baseline for patient.  Keep off of ARB and SGLT2 as above.  If crt stays stable of improves tomorrow think could be a candidate for discharge back to facility.  Is established with [facility]. Renal will sign off due to high consult census-  call with any concerns    Curtis DELENA Heman    Labs: Basic Metabolic Panel: Recent Labs  Lab 11/23/23 1055 11/23/23 1108 11/23/23 1109 11/24/23 0443  NA 139 141 142 138  K 4.7 4.8 4.8 4.4  CL 110 109  --   112*  CO2 19*  --   --  18*  GLUCOSE 125* 120*  --  131*  BUN 107* 123*  --  82*  CREATININE 2.21* 2.30*  --  1.79*  CALCIUM  8.9  --   --  8.0*   Liver Function Tests: Recent Labs  Lab 11/23/23 1055  AST 24  ALT 19  ALKPHOS 79  BILITOT 0.7  PROT 8.1  ALBUMIN  3.9   No results for input(s): LIPASE, AMYLASE in the last 168 hours. No results for input(s): AMMONIA in the last 168 hours. CBC: Recent Labs  Lab 11/23/23 1055 11/23/23 1108 11/23/23 1109 11/24/23 0443  WBC 7.5  --   --  5.9  NEUTROABS 4.5  --   --   --   HGB 11.8* 12.6* 12.2* 9.6*  HCT 37.2* 37.0* 36.0* 29.0*  MCV 101.9*  --   --  100.0  PLT 218  --   --  159   Cardiac Enzymes: No results for input(s): CKTOTAL, CKMB, CKMBINDEX, TROPONINI in the last 168 hours. CBG: Recent  Labs  Lab 11/23/23 1502 11/23/23 1755 11/23/23 2124 11/24/23 0854  GLUCAP 103* 103* 207* 118*    Iron Studies: No results for input(s): IRON, TIBC, TRANSFERRIN, FERRITIN in the last 72 hours. Studies/Results: DG Chest Portable 1 View Result Date: 11/23/2023 CLINICAL DATA:  Pneumonia EXAM: PORTABLE CHEST 1 VIEW COMPARISON:  Chest radiograph dated 09/24/2023 FINDINGS: Normal lung volumes. Patchy right mid and bilateral lower lung opacities. No pleural effusion or pneumothorax. The heart size and mediastinal contours are within normal limits. Median sternotomy wires are nondisplaced. IMPRESSION: Patchy right mid and bilateral lower lung opacities, which may represent atelectasis or multifocal infection. Electronically Signed   By: Limin  Xu M.D.   On: 11/23/2023 11:49   Medications: Infusions:  sodium chloride  75 mL/hr at 11/23/23 1539   cefTRIAXone  (ROCEPHIN )  IV Stopped (11/23/23 1539)    Scheduled Medications:  alfuzosin   10 mg Oral Q breakfast   amLODipine   10 mg Oral Daily   aspirin  EC  81 mg Oral Daily   carvedilol   25 mg Oral BID WC   [START ON 11/29/2023] cloNIDine  0.1 mg Transdermal Q Fri    finasteride   5 mg Oral Daily   guaiFENesin   600 mg Oral BID   heparin   5,000 Units Subcutaneous Q8H   insulin  aspart  0-9 Units Subcutaneous TID WC   insulin  glargine-yfgn  8 Units Subcutaneous QHS   sertraline   100 mg Oral Daily   sodium chloride  flush  3 mL Intravenous Q12H    have reviewed scheduled and prn medications.  Physical Exam: General: thin-  alert, pleasantly confused Heart: RRR Lungs: mostly clear Abdomen: soft, non tender Extremities: no edema     11/24/2023,11:05 AM  LOS: 1 day

## 2023-11-24 NOTE — Subjective & Objective (Signed)
 Pt seen and examined. No family at bedside. Nephrology notes reviewed and appreciated.   BUN down to 82 today. Was 107. Remains on IVF. Asked RN to place condom catheter to get more accurate I&Os.  Scr 1.79 today, was 2.21 yesterday.

## 2023-11-25 DIAGNOSIS — N184 Chronic kidney disease, stage 4 (severe): Secondary | ICD-10-CM | POA: Diagnosis not present

## 2023-11-25 DIAGNOSIS — N179 Acute kidney failure, unspecified: Secondary | ICD-10-CM | POA: Diagnosis not present

## 2023-11-25 LAB — CBC WITH DIFFERENTIAL/PLATELET
Abs Immature Granulocytes: 0.04 10*3/uL (ref 0.00–0.07)
Basophils Absolute: 0.1 10*3/uL (ref 0.0–0.1)
Basophils Relative: 1 %
Eosinophils Absolute: 0.6 10*3/uL — ABNORMAL HIGH (ref 0.0–0.5)
Eosinophils Relative: 9 %
HCT: 31.9 % — ABNORMAL LOW (ref 39.0–52.0)
Hemoglobin: 10.4 g/dL — ABNORMAL LOW (ref 13.0–17.0)
Immature Granulocytes: 1 %
Lymphocytes Relative: 24 %
Lymphs Abs: 1.5 10*3/uL (ref 0.7–4.0)
MCH: 32.5 pg (ref 26.0–34.0)
MCHC: 32.6 g/dL (ref 30.0–36.0)
MCV: 99.7 fL (ref 80.0–100.0)
Monocytes Absolute: 0.7 10*3/uL (ref 0.1–1.0)
Monocytes Relative: 12 %
Neutro Abs: 3.4 10*3/uL (ref 1.7–7.7)
Neutrophils Relative %: 53 %
Platelets: 159 10*3/uL (ref 150–400)
RBC: 3.2 MIL/uL — ABNORMAL LOW (ref 4.22–5.81)
RDW: 12.4 % (ref 11.5–15.5)
WBC: 6.3 10*3/uL (ref 4.0–10.5)
nRBC: 0 % (ref 0.0–0.2)

## 2023-11-25 LAB — GLUCOSE, CAPILLARY
Glucose-Capillary: 136 mg/dL — ABNORMAL HIGH (ref 70–99)
Glucose-Capillary: 276 mg/dL — ABNORMAL HIGH (ref 70–99)

## 2023-11-25 LAB — COMPREHENSIVE METABOLIC PANEL WITH GFR
ALT: 14 U/L (ref 0–44)
AST: 17 U/L (ref 15–41)
Albumin: 3 g/dL — ABNORMAL LOW (ref 3.5–5.0)
Alkaline Phosphatase: 49 U/L (ref 38–126)
Anion gap: 8 (ref 5–15)
BUN: 65 mg/dL — ABNORMAL HIGH (ref 8–23)
CO2: 18 mmol/L — ABNORMAL LOW (ref 22–32)
Calcium: 8.4 mg/dL — ABNORMAL LOW (ref 8.9–10.3)
Chloride: 115 mmol/L — ABNORMAL HIGH (ref 98–111)
Creatinine, Ser: 1.76 mg/dL — ABNORMAL HIGH (ref 0.61–1.24)
GFR, Estimated: 37 mL/min — ABNORMAL LOW (ref >60)
Glucose, Bld: 138 mg/dL — ABNORMAL HIGH (ref 70–99)
Potassium: 4.6 mmol/L (ref 3.5–5.1)
Sodium: 141 mmol/L (ref 135–145)
Total Bilirubin: 0.7 mg/dL (ref 0.0–1.2)
Total Protein: 6.3 g/dL — ABNORMAL LOW (ref 6.5–8.1)

## 2023-11-25 LAB — MAGNESIUM: Magnesium: 2.4 mg/dL (ref 1.7–2.4)

## 2023-11-25 MED ORDER — RISPERIDONE 0.5 MG PO TABS: ORAL_TABLET | ORAL | 0 refills | Status: DC

## 2023-11-25 MED ORDER — CARVEDILOL 12.5 MG PO TABS: 25.0000 mg | ORAL_TABLET | Freq: Two times a day (BID) | ORAL | 0 refills | Status: DC

## 2023-11-25 NOTE — NC FL2 (Signed)
 [place]  MEDICAID FL2 LEVEL OF CARE FORM     IDENTIFICATION  Patient Name: Justin Salinas Birthdate: May 31, 1941 Sex: male Admission Date (Current Location): 11/23/2023  [facility] and [place] Number:  Producer, television/film/video and Address:  [facility], [address] [place], [place] [address]      Provider Number: [id number]  Attending Physician Name and Address:  Verdene Purchase, MD  Relative Name and Phone Number:       Current Level of Care: Hospital Recommended Level of Care: Assisted Living Facility Prior Approval Number:    Date Approved/Denied:   PASRR Number:    Discharge Plan: Other (Comment) (ALF)    Current Diagnoses: Patient Active Problem List   Diagnosis Date Noted   Acute kidney injury superimposed on stage 4 chronic kidney disease (HCC) 11/23/2023   Chronic diastolic CHF (congestive heart failure) (HCC) 11/23/2023   Macrocytic anemia 11/23/2023   Dementia with behavioral disturbance (HCC) 11/23/2023   AKI (acute kidney injury) (HCC) 09/25/2023   Hyperglycemia 07/24/2022   Recurrent falls 07/23/2022   Cognitive decline 07/23/2022   Lumbar radiculopathy 09/23/2020   Atherosclerosis of abdominal aorta (HCC) 02/26/2020   Status post placement of bone anchored hearing aid (BAHA) 05/09/2019   Asymmetric SNHL (sensorineural hearing loss) 04/02/2019   Iron deficiency anemia 10/22/2017   Type 2 diabetes mellitus with hyperglycemia (HCC) 01/03/2017   PVD (posterior vitreous detachment), left eye 02/16/2016   Vitreous syneresis of right eye 02/16/2016   Diastolic dysfunction    Hypertension associated with diabetes (HCC)    CKD stage 3 due to type 2 diabetes mellitus (HCC)    S/P CABG x 4 05/21/2014   Bilateral dry eyes 01/20/2014   Nuclear cataract, bilateral 12/10/2013   Astigmatism 01/11/2012   Hypermetropia 01/11/2012   Presbyopia 01/11/2012   Essential hypertension 12/01/2007   Carotid artery disease (HCC) 12/01/2007   BPH  (benign prostatic hyperplasia) 12/01/2007   Controlled type 2 diabetes mellitus, with long-term current use of insulin  (HCC) 11/28/2007   CAD (coronary artery disease) 11/28/2007   GERD 11/28/2007    Orientation RESPIRATION BLADDER Height & Weight     Self, Place  Normal Incontinent, External catheter Weight: 166 lb 0.1 oz (75.3 kg) Height:  5' 9 (175.3 cm)  BEHAVIORAL SYMPTOMS/MOOD NEUROLOGICAL BOWEL NUTRITION STATUS      Continent Diet: Heart Healthy  AMBULATORY STATUS COMMUNICATION OF NEEDS Skin   Limited Assist Verbally Normal                       Personal Care Assistance Level of Assistance  Feeding, Bathing, Dressing Bathing Assistance: Limited assistance Feeding assistance: Limited assistance Dressing Assistance: Limited assistance     Functional Limitations Info  Sight, Hearing, Speech Sight Info: Impaired Hearing Info: Impaired Speech Info: Adequate    SPECIAL CARE FACTORS FREQUENCY                       Contractures Contractures Info: Not present    Additional Factors Info  Code Status, Allergies Code Status Info: FULL Allergies Info: Exenatide  Linagliptin   Lasix  (Furosemide )           Current Medications (11/25/2023):  This is the current hospital active medication list Current Facility-Administered Medications  Medication Dose Route Frequency Provider Last Rate Last Admin   acetaminophen  (TYLENOL ) tablet 650 mg  650 mg Oral Q6H PRN Claudene Maximino LABOR, MD       Or  acetaminophen  (TYLENOL ) suppository 650 mg  650 mg Rectal Q6H PRN Claudene Reeves A, MD       albuterol  (PROVENTIL ) (2.5 MG/3ML) 0.083% nebulizer solution 2.5 mg  2.5 mg Nebulization Q6H PRN Smith, Rondell A, MD       alfuzosin  (UROXATRAL ) 24 hr tablet 10 mg  10 mg Oral Q breakfast Claudene Reeves A, MD   10 mg at 11/25/23 9173   amLODipine  (NORVASC ) tablet 10 mg  10 mg Oral Daily Kommor, Madison, MD   10 mg at 11/25/23 9173   aspirin  EC tablet 81 mg  81 mg Oral Daily Smith,  Rondell A, MD   81 mg at 11/25/23 9173   carvedilol  (COREG ) tablet 25 mg  25 mg Oral BID WC Smith, Rondell A, MD   25 mg at 11/25/23 0825   [START ON 11/29/2023] cloNIDine (CATAPRES - Dosed in mg/24 hr) patch 0.1 mg  0.1 mg Transdermal Q Fri Smith, Rondell A, MD       finasteride  (PROSCAR ) tablet 5 mg  5 mg Oral Daily Smith, Rondell A, MD   5 mg at 11/25/23 9173   guaiFENesin  (MUCINEX ) 12 hr tablet 600 mg  600 mg Oral BID Smith, Rondell A, MD   600 mg at 11/25/23 0826   heparin  injection 5,000 Units  5,000 Units Subcutaneous Q8H Smith, Rondell A, MD   5,000 Units at 11/25/23 9391   hydrALAZINE  (APRESOLINE ) injection 10 mg  10 mg Intravenous Q4H PRN Smith, Rondell A, MD       insulin  aspart (novoLOG ) injection 0-9 Units  0-9 Units Subcutaneous TID WC Claudene Reeves A, MD   5 Units at 11/25/23 1201   insulin  glargine-yfgn (SEMGLEE ) injection 8 Units  8 Units Subcutaneous QHS Smith, Rondell A, MD   8 Units at 11/24/23 2334   ondansetron  (ZOFRAN ) tablet 4 mg  4 mg Oral Q6H PRN Smith, Rondell A, MD       Or   ondansetron  (ZOFRAN ) injection 4 mg  4 mg Intravenous Q6H PRN Claudene Reeves A, MD       risperiDONE  (RISPERDAL ) tablet 0.5 mg  0.5 mg Oral Q12H Laurence Locus, DO   0.5 mg at 11/25/23 9173   sertraline  (ZOLOFT ) tablet 100 mg  100 mg Oral Daily Smith, Rondell A, MD   100 mg at 11/25/23 9173   sodium chloride  flush (NS) 0.9 % injection 3 mL  3 mL Intravenous Q12H Claudene Reeves A, MD   3 mL at 11/25/23 9171     Discharge Medications: Medication List       STOP taking these medications     empagliflozin  10 MG Tabs tablet Commonly known as: Jardiance     furosemide  20 MG tablet Commonly known as: LASIX     furosemide  40 MG tablet Commonly known as: LASIX     olmesartan  20 MG tablet Commonly known as: BENICAR            TAKE these medications     acetaminophen  325 MG tablet Commonly known as: TYLENOL  Take 650 mg by mouth every 6 (six) hours as needed for moderate pain (pain score  4-6).    alfuzosin  10 MG 24 hr tablet Commonly known as: UROXATRAL  Take 10 mg by mouth daily with breakfast.    amLODipine  10 MG tablet Commonly known as: NORVASC  TAKE 1 TABLET EVERY DAY    antiseptic oral rinse Liqd 15 mLs by Mouth Rinse route 4 (four) times daily as needed for dry mouth.    aspirin  81 MG tablet Take  81 mg by mouth every morning.    carvedilol  12.5 MG tablet Commonly known as: COREG  Take 2 tablets (25 mg total) by mouth 2 (two) times daily with a meal. Hold if SBP<105 or HR<60    cloNIDine 0.1 mg/24hr patch Commonly known as: CATAPRES - Dosed in mg/24 hr Place 0.1 mg onto the skin every Friday.    finasteride  5 MG tablet Commonly known as: PROSCAR  Take 5 mg by mouth daily.    fluticasone 50 MCG/ACT nasal spray Commonly known as: FLONASE Place 1 spray into both nostrils daily.    HumaLOG KwikPen 100 UNIT/ML KwikPen Generic drug: insulin  lispro Inject 0-10 Units into the skin 3 (three) times daily. Sliding Scale: If BS<70 Call MD, IF BS is 71-150=0 units, 151-200=2 units, 201-250=4 units, 251-300=6 units, 301-350=8 units, 351-400=10 units, BS>400 Call MD    Lantus  SoloStar 100 UNIT/ML Solostar Pen Generic drug: insulin  glargine Inject 8 Units into the skin at bedtime.    loratadine 10 MG tablet Commonly known as: CLARITIN Take 10 mg by mouth daily.    polyethylene glycol 17 g packet Commonly known as: MIRALAX  / GLYCOLAX  Take 17 g by mouth daily as needed.    repaglinide  0.5 MG tablet Commonly known as: PRANDIN  Take 0.5 mg by mouth 3 (three) times daily before meals.    risperiDONE  0.5 MG tablet Commonly known as: RISPERDAL  Take 0.5 mg every morning and 1 mg every evening. What changed:  how much to take how to take this when to take this additional instructions    senna 8.6 MG Tabs tablet Commonly known as: SENOKOT Take 1 tablet by mouth every evening.    sertraline  100 MG tablet Commonly known as: ZOLOFT  Take 100 mg by mouth daily.     vitamin C 1000 MG tablet Take 1,000 mg by mouth daily.    Vitamin D3 Super Strength 50 MCG (2000 UT) tablet Generic drug: Cholecalciferol Take 1 tablet by mouth daily.      Relevant Imaging Results:  Relevant Lab Results:   Additional Information SSN:  [id number]  Reverie Vaquera A [place], LCSW

## 2023-11-25 NOTE — Plan of Care (Signed)

## 2023-11-25 NOTE — Discharge Summary (Signed)
 [facility]  Physician Discharge Summary   Patient ID: Justin Salinas MRN: [id number] DOB/AGE: 1941-01-24 83 y.o.  Admit date: 11/23/2023 Discharge date:   11/25/2023   PCP: Kennyth Worth HERO, MD  DISCHARGE DIAGNOSES:    Acute kidney injury superimposed on stage 4 chronic kidney disease (HCC)   Controlled type 2 diabetes mellitus, with long-term current use of insulin  (HCC)   Essential hypertension   BPH (benign prostatic hyperplasia)   Chronic diastolic CHF (congestive heart failure) (HCC)   Macrocytic anemia   Dementia with behavioral disturbance (HCC)   RECOMMENDATIONS FOR OUTPATIENT FOLLOW UP: CBC and basic metabolic panel in 1 week   Home Health: None Equipment/Devices: None  CODE STATUS: Full code  DISCHARGE CONDITION: good  Diet recommendation: Heart healthy  INITIAL HISTORY:  83 y.o. male with medical history significant of CKD stage III, CAD, DM 2, chronic CHF with preserved EF, HTN, GERD, HLD, and dementia with behavioral disturbance who presents for abnormal labs. He is accompanied by his daughter.  History is obtained with the assistance of the patient's daughter present at bedside as well as review of records.   He makes note of pain in his kidneys, described as a hurting sensation in his back. His daughter mentions that recent blood work showed elevated kidney levels, leading to a recommendation for fluid administration. He was placed on fluids from Thursday to Saturday, but the treatment was interrupted when he became confused and removed the IV.  It was reported that outpatient labs revealed creatinine was elevated to 2.44 with BUN 127.  The provider at the facility noted that if he was unable to complete the IV fluid boluses that he should be admitted into the hospital for which he was brought.   He resides in an assisted living facility where he receives protein shakes and supplements to maintain nutrition, although he notes that the food quality is  not to his liking.  Daughter notes that he has had fair appetite and seems to drink adequate fluids.   He has a history of pneumonia in April of this year for which he was hospitalized.  He reports a mild cough but denies any significant issues with swallowing, although he then admitted occasionally that he gets choked up while eating.    In the emergency department patient was noted to be afebrile with blood pressures elevated up to 196/166, and all other vital signs maintained.  Labs noted WBC 7.5, lactic acid 1.2, hemoglobin 12.2, CO2 19, BUN 107, and creatinine 2.21.  Urinalysis noted greater than 500 glucose without signs of infection.  Chest x-ray noted patchy right mid and lower lung opacities concerning for atelectasis or multifocal pneumonia.  Patient has been given 2 L of lactated Ringer 's, amlodipine  10 mg p.o., Augmentin , and azithromycin .     HOSPITAL COURSE:   Acute kidney injury superimposed on stage 4 chronic kidney disease (HCC) Patient was given IV fluids.  His ARB and diuretics were held.  Seen by nephrology.  Kidney function has improved.   Dementia with behavioral disturbance (HCC) Continue zoloft  and riperdal.  Evening dose of Risperdal  has been increased.   Macrocytic anemia Continue oral B12.   Chronic diastolic CHF (congestive heart failure) (HCC) Not a candidate for ARB/entresto/farxiga due to CKD. Per nephrology, no longer a candidate for ARB and SGLT2 as he is a set up for AKI.. will discontinue them from his home meds list so that are not accidentally continued at discharge.   Patient is stable.  Discussed with his daughter.  Okay for discharge back to ALF today.   PERTINENT LABS:  The results of significant diagnostics from this hospitalization (including imaging, microbiology, ancillary and laboratory) are listed below for reference.      Labs:   Basic Metabolic Panel: Recent Labs  Lab 11/23/23 1055 11/23/23 1108 11/23/23 1109 11/24/23 0443  11/25/23 0426  NA 139 141 142 138 141  K 4.7 4.8 4.8 4.4 4.6  CL 110 109  --  112* 115*  CO2 19*  --   --  18* 18*  GLUCOSE 125* 120*  --  131* 138*  BUN 107* 123*  --  82* 65*  CREATININE 2.21* 2.30*  --  1.79* 1.76*  CALCIUM  8.9  --   --  8.0* 8.4*  MG  --   --   --   --  2.4   Liver Function Tests: Recent Labs  Lab 11/23/23 1055 11/25/23 0426  AST 24 17  ALT 19 14  ALKPHOS 79 49  BILITOT 0.7 0.7  PROT 8.1 6.3*  ALBUMIN  3.9 3.0*   CBC: Recent Labs  Lab 11/23/23 1055 11/23/23 1108 11/23/23 1109 11/24/23 0443 11/25/23 0426  WBC 7.5  --   --  5.9 6.3  NEUTROABS 4.5  --   --   --  3.4  HGB 11.8* 12.6* 12.2* 9.6* 10.4*  HCT 37.2* 37.0* 36.0* 29.0* 31.9*  MCV 101.9*  --   --  100.0 99.7  PLT 218  --   --  159 159   BNP: BNP (last 3 results) Recent Labs    11/23/23 1430  BNP 144.2*    CBG: Recent Labs  Lab 11/24/23 0854 11/24/23 1142 11/24/23 1750 11/24/23 2046 11/25/23 0812  GLUCAP 118* 240* 149* 175* 136*     IMAGING STUDIES DG Chest Portable 1 View Result Date: 11/23/2023 CLINICAL DATA:  Pneumonia EXAM: PORTABLE CHEST 1 VIEW COMPARISON:  Chest radiograph dated 09/24/2023 FINDINGS: Normal lung volumes. Patchy right mid and bilateral lower lung opacities. No pleural effusion or pneumothorax. The heart size and mediastinal contours are within normal limits. Median sternotomy wires are nondisplaced. IMPRESSION: Patchy right mid and bilateral lower lung opacities, which may represent atelectasis or multifocal infection. Electronically Signed   By: Limin  Xu M.D.   On: 11/23/2023 11:49    DISCHARGE EXAMINATION: Vitals:   11/24/23 2045 11/24/23 2353 11/25/23 0411 11/25/23 0808  BP: 137/79 (!) 153/70 (!) 163/67 (!) 151/68  Pulse: 74 73 72 61  Resp: 20 19 20 17   Temp: (!) 97.5 F (36.4 C) 98.7 F (37.1 C) 98.3 F (36.8 C) 97.8 F (36.6 C)  TempSrc:  Oral  Oral  SpO2: 100% 97% 98% 100%  Weight:   75.3 kg   Height:       General appearance: Awake  alert.  In no distress Resp: Clear to auscultation bilaterally.  Normal effort Cardio: S1-S2 is normal regular.  No S3-S4.  No rubs murmurs or bruit GI: Abdomen is soft.  Nontender nondistended.  Bowel sounds are present normal.  No masses organomegaly   DISPOSITION: ALF  Discharge Instructions     Call MD for:  difficulty breathing, headache or visual disturbances   Complete by: As directed    Call MD for:  extreme fatigue   Complete by: As directed    Call MD for:  persistant dizziness or light-headedness   Complete by: As directed    Call MD for:  persistant nausea and vomiting   Complete  by: As directed    Call MD for:  severe uncontrolled pain   Complete by: As directed    Call MD for:  temperature >100.4   Complete by: As directed    Diet - low sodium heart healthy   Complete by: As directed    Discharge instructions   Complete by: As directed    Please review instructions on the discharge summary.  You were cared for by a hospitalist during your hospital stay. If you have any questions about your discharge medications or the care you received while you were in the hospital after you are discharged, you can call the unit and asked to speak with the hospitalist on call if the hospitalist that took care of you is not available. Once you are discharged, your primary care physician will handle any further medical issues. Please note that NO REFILLS for any discharge medications will be authorized once you are discharged, as it is imperative that you return to your primary care physician (or establish a relationship with a primary care physician if you do not have one) for your aftercare needs so that they can reassess your need for medications and monitor your lab values. If you do not have a primary care physician, you can call [phone] for a physician referral.   Increase activity slowly   Complete by: As directed          Allergies as of 11/25/2023       Reactions    Exenatide Other (See Comments)   Exenatide, sold under the brand name Byetta among others, is a medication used to treat type 2 diabetes.   Linagliptin  Other (See Comments)   Tradjenta  (linagliptin ) is a brand-name tablet prescribed for type 2 diabetes.   Lasix  [furosemide ] Other (See Comments)   Constipation        Medication List     STOP taking these medications    empagliflozin  10 MG Tabs tablet Commonly known as: Jardiance    furosemide  20 MG tablet Commonly known as: LASIX    furosemide  40 MG tablet Commonly known as: LASIX    olmesartan  20 MG tablet Commonly known as: BENICAR        TAKE these medications    acetaminophen  325 MG tablet Commonly known as: TYLENOL  Take 650 mg by mouth every 6 (six) hours as needed for moderate pain (pain score 4-6).   alfuzosin  10 MG 24 hr tablet Commonly known as: UROXATRAL  Take 10 mg by mouth daily with breakfast.   amLODipine  10 MG tablet Commonly known as: NORVASC  TAKE 1 TABLET EVERY DAY   antiseptic oral rinse Liqd 15 mLs by Mouth Rinse route 4 (four) times daily as needed for dry mouth.   aspirin  81 MG tablet Take 81 mg by mouth every morning.   carvedilol  12.5 MG tablet Commonly known as: COREG  Take 2 tablets (25 mg total) by mouth 2 (two) times daily with a meal. Hold if SBP<105 or HR<60   cloNIDine 0.1 mg/24hr patch Commonly known as: CATAPRES - Dosed in mg/24 hr Place 0.1 mg onto the skin every Friday.   finasteride  5 MG tablet Commonly known as: PROSCAR  Take 5 mg by mouth daily.   fluticasone 50 MCG/ACT nasal spray Commonly known as: FLONASE Place 1 spray into both nostrils daily.   HumaLOG KwikPen 100 UNIT/ML KwikPen Generic drug: insulin  lispro Inject 0-10 Units into the skin 3 (three) times daily. Sliding Scale: If BS<70 Call MD, IF BS is 71-150=0 units, 151-200=2 units, 201-250=4 units, 251-300=6 units,  301-350=8 units, 351-400=10 units, BS>400 Call MD   Lantus  SoloStar 100 UNIT/ML Solostar  Pen Generic drug: insulin  glargine Inject 8 Units into the skin at bedtime.   loratadine 10 MG tablet Commonly known as: CLARITIN Take 10 mg by mouth daily.   polyethylene glycol 17 g packet Commonly known as: MIRALAX  / GLYCOLAX  Take 17 g by mouth daily as needed.   repaglinide  0.5 MG tablet Commonly known as: PRANDIN  Take 0.5 mg by mouth 3 (three) times daily before meals.   risperiDONE  0.5 MG tablet Commonly known as: RISPERDAL  Take 0.5 mg every morning and 1 mg every evening. What changed:  how much to take how to take this when to take this additional instructions   senna 8.6 MG Tabs tablet Commonly known as: SENOKOT Take 1 tablet by mouth every evening.   sertraline  100 MG tablet Commonly known as: ZOLOFT  Take 100 mg by mouth daily.   vitamin C 1000 MG tablet Take 1,000 mg by mouth daily.   Vitamin D3 Super Strength 50 MCG (2000 UT) tablet Generic drug: Cholecalciferol Take 1 tablet by mouth daily.          Follow-up Information     Kennyth Worth HERO, MD. Schedule an appointment as soon as possible for a visit in 1 week(s).   Specialty: Family Medicine Why: post hospitalization follow up Contact information: [address] [place] [address] [phone]                 TOTAL DISCHARGE TIME: 35 minutes  Ritaj Dullea Verdene  [facility] Pager on [email or web address]  11/25/2023, 11:14 AM

## 2023-11-25 NOTE — TOC Transition Note (Signed)
 Transition of Care [facility]) - Discharge Note   Patient Details  Name: Justin Salinas MRN: [id number] Date of Birth: [birth year]  [facility]) CM/SW Contact:  Massiah Longanecker A [place], LCSW Phone Number: 11/25/2023, 12:27 PM   Clinical Narrative:     Patient will DC to: [facility]   Anticipated DC date: 11/25/23  Family notified: [name] Pouch  Transport by: Pt's daughter, vehicle      Per MD patient ready for DC to [facility]F. RN, patient, patient's family, and facility notified of DC. Discharge Summary and FL2 sent to facility. RN to call report prior to discharge [phone]). DC packet on chart. Private vehicle for transport, daughter providing transportation, pickup at 4:30pm.      CSW will sign off for now as social work intervention is no longer needed. Please consult us  again if new needs arise.   Final next level of care: Skilled Nursing Facility Barriers to Discharge: Barriers Resolved   Patient Goals and CMS Choice            Discharge Placement              Patient chooses bed at: [facility] Patient to be transferred to facility by: Pt's daughter [name] Name of family member notified: [name] Pouch Patient and family notified of of transfer: 11/25/23  Discharge Plan and Services Additional resources added to the After Visit Summary for                                       Social Drivers of Health (SDOH) Interventions SDOH Screenings   Food Insecurity: No Food Insecurity (11/24/2023)  Housing: Unknown (11/24/2023)  Transportation Needs: Patient Declined (11/24/2023)  Utilities: Patient Declined (11/24/2023)  Depression (PHQ2-9): Low Risk  (12/11/2022)  Financial Resource Strain: Low Risk  (12/11/2022)  Physical Activity: Insufficiently Active (12/11/2022)  Social Connections: Moderately Integrated (11/24/2023)  Recent Concern: Social Connections - Moderately Isolated (09/25/2023)  Stress: No Stress Concern  Present (12/11/2022)  Tobacco Use: High Risk (11/23/2023)     Readmission Risk Interventions     No data to display

## 2023-11-26 DIAGNOSIS — M6281 Muscle weakness (generalized): Secondary | ICD-10-CM | POA: Diagnosis not present

## 2023-11-26 DIAGNOSIS — R2681 Unsteadiness on feet: Secondary | ICD-10-CM | POA: Diagnosis not present

## 2023-11-26 DIAGNOSIS — R278 Other lack of coordination: Secondary | ICD-10-CM | POA: Diagnosis not present

## 2023-11-26 DIAGNOSIS — N184 Chronic kidney disease, stage 4 (severe): Secondary | ICD-10-CM | POA: Diagnosis not present

## 2023-11-26 DIAGNOSIS — N179 Acute kidney failure, unspecified: Secondary | ICD-10-CM | POA: Diagnosis not present

## 2023-11-26 DIAGNOSIS — Z741 Need for assistance with personal care: Secondary | ICD-10-CM | POA: Diagnosis not present

## 2023-11-27 DIAGNOSIS — N184 Chronic kidney disease, stage 4 (severe): Secondary | ICD-10-CM | POA: Diagnosis not present

## 2023-11-27 DIAGNOSIS — R278 Other lack of coordination: Secondary | ICD-10-CM | POA: Diagnosis not present

## 2023-11-27 DIAGNOSIS — M6281 Muscle weakness (generalized): Secondary | ICD-10-CM | POA: Diagnosis not present

## 2023-11-27 DIAGNOSIS — R2681 Unsteadiness on feet: Secondary | ICD-10-CM | POA: Diagnosis not present

## 2023-11-27 DIAGNOSIS — N179 Acute kidney failure, unspecified: Secondary | ICD-10-CM | POA: Diagnosis not present

## 2023-11-27 DIAGNOSIS — Z741 Need for assistance with personal care: Secondary | ICD-10-CM | POA: Diagnosis not present

## 2023-11-28 DIAGNOSIS — R278 Other lack of coordination: Secondary | ICD-10-CM | POA: Diagnosis not present

## 2023-11-28 DIAGNOSIS — Z741 Need for assistance with personal care: Secondary | ICD-10-CM | POA: Diagnosis not present

## 2023-11-28 DIAGNOSIS — R2681 Unsteadiness on feet: Secondary | ICD-10-CM | POA: Diagnosis not present

## 2023-11-28 DIAGNOSIS — N179 Acute kidney failure, unspecified: Secondary | ICD-10-CM | POA: Diagnosis not present

## 2023-11-28 DIAGNOSIS — N184 Chronic kidney disease, stage 4 (severe): Secondary | ICD-10-CM | POA: Diagnosis not present

## 2023-11-28 DIAGNOSIS — M6281 Muscle weakness (generalized): Secondary | ICD-10-CM | POA: Diagnosis not present

## 2023-11-29 DIAGNOSIS — R2681 Unsteadiness on feet: Secondary | ICD-10-CM | POA: Diagnosis not present

## 2023-11-29 DIAGNOSIS — N179 Acute kidney failure, unspecified: Secondary | ICD-10-CM | POA: Diagnosis not present

## 2023-11-29 DIAGNOSIS — M6281 Muscle weakness (generalized): Secondary | ICD-10-CM | POA: Diagnosis not present

## 2023-11-29 DIAGNOSIS — N184 Chronic kidney disease, stage 4 (severe): Secondary | ICD-10-CM | POA: Diagnosis not present

## 2023-11-29 DIAGNOSIS — Z741 Need for assistance with personal care: Secondary | ICD-10-CM | POA: Diagnosis not present

## 2023-11-29 DIAGNOSIS — R278 Other lack of coordination: Secondary | ICD-10-CM | POA: Diagnosis not present

## 2023-12-02 DIAGNOSIS — R2681 Unsteadiness on feet: Secondary | ICD-10-CM | POA: Diagnosis not present

## 2023-12-02 DIAGNOSIS — Z741 Need for assistance with personal care: Secondary | ICD-10-CM | POA: Diagnosis not present

## 2023-12-02 DIAGNOSIS — R278 Other lack of coordination: Secondary | ICD-10-CM | POA: Diagnosis not present

## 2023-12-02 DIAGNOSIS — N184 Chronic kidney disease, stage 4 (severe): Secondary | ICD-10-CM | POA: Diagnosis not present

## 2023-12-02 DIAGNOSIS — N179 Acute kidney failure, unspecified: Secondary | ICD-10-CM | POA: Diagnosis not present

## 2023-12-02 DIAGNOSIS — M6281 Muscle weakness (generalized): Secondary | ICD-10-CM | POA: Diagnosis not present

## 2023-12-03 DIAGNOSIS — Z741 Need for assistance with personal care: Secondary | ICD-10-CM | POA: Diagnosis not present

## 2023-12-03 DIAGNOSIS — N179 Acute kidney failure, unspecified: Secondary | ICD-10-CM | POA: Diagnosis not present

## 2023-12-03 DIAGNOSIS — N184 Chronic kidney disease, stage 4 (severe): Secondary | ICD-10-CM | POA: Diagnosis not present

## 2023-12-03 DIAGNOSIS — R2681 Unsteadiness on feet: Secondary | ICD-10-CM | POA: Diagnosis not present

## 2023-12-03 DIAGNOSIS — M6281 Muscle weakness (generalized): Secondary | ICD-10-CM | POA: Diagnosis not present

## 2023-12-03 DIAGNOSIS — R278 Other lack of coordination: Secondary | ICD-10-CM | POA: Diagnosis not present

## 2023-12-04 DIAGNOSIS — M6281 Muscle weakness (generalized): Secondary | ICD-10-CM | POA: Diagnosis not present

## 2023-12-04 DIAGNOSIS — R2681 Unsteadiness on feet: Secondary | ICD-10-CM | POA: Diagnosis not present

## 2023-12-04 DIAGNOSIS — R278 Other lack of coordination: Secondary | ICD-10-CM | POA: Diagnosis not present

## 2023-12-04 DIAGNOSIS — N179 Acute kidney failure, unspecified: Secondary | ICD-10-CM | POA: Diagnosis not present

## 2023-12-04 DIAGNOSIS — Z741 Need for assistance with personal care: Secondary | ICD-10-CM | POA: Diagnosis not present

## 2023-12-04 DIAGNOSIS — N184 Chronic kidney disease, stage 4 (severe): Secondary | ICD-10-CM | POA: Diagnosis not present

## 2023-12-05 DIAGNOSIS — R2681 Unsteadiness on feet: Secondary | ICD-10-CM | POA: Diagnosis not present

## 2023-12-05 DIAGNOSIS — R278 Other lack of coordination: Secondary | ICD-10-CM | POA: Diagnosis not present

## 2023-12-05 DIAGNOSIS — M6281 Muscle weakness (generalized): Secondary | ICD-10-CM | POA: Diagnosis not present

## 2023-12-05 DIAGNOSIS — N179 Acute kidney failure, unspecified: Secondary | ICD-10-CM | POA: Diagnosis not present

## 2023-12-05 DIAGNOSIS — Z741 Need for assistance with personal care: Secondary | ICD-10-CM | POA: Diagnosis not present

## 2023-12-05 DIAGNOSIS — N184 Chronic kidney disease, stage 4 (severe): Secondary | ICD-10-CM | POA: Diagnosis not present

## 2023-12-06 DIAGNOSIS — R2681 Unsteadiness on feet: Secondary | ICD-10-CM | POA: Diagnosis not present

## 2023-12-06 DIAGNOSIS — Z741 Need for assistance with personal care: Secondary | ICD-10-CM | POA: Diagnosis not present

## 2023-12-06 DIAGNOSIS — M6281 Muscle weakness (generalized): Secondary | ICD-10-CM | POA: Diagnosis not present

## 2023-12-06 DIAGNOSIS — N184 Chronic kidney disease, stage 4 (severe): Secondary | ICD-10-CM | POA: Diagnosis not present

## 2023-12-06 DIAGNOSIS — R278 Other lack of coordination: Secondary | ICD-10-CM | POA: Diagnosis not present

## 2023-12-06 DIAGNOSIS — N179 Acute kidney failure, unspecified: Secondary | ICD-10-CM | POA: Diagnosis not present

## 2023-12-09 DIAGNOSIS — N179 Acute kidney failure, unspecified: Secondary | ICD-10-CM | POA: Diagnosis not present

## 2023-12-09 DIAGNOSIS — R278 Other lack of coordination: Secondary | ICD-10-CM | POA: Diagnosis not present

## 2023-12-09 DIAGNOSIS — R2681 Unsteadiness on feet: Secondary | ICD-10-CM | POA: Diagnosis not present

## 2023-12-09 DIAGNOSIS — N184 Chronic kidney disease, stage 4 (severe): Secondary | ICD-10-CM | POA: Diagnosis not present

## 2023-12-09 DIAGNOSIS — Z741 Need for assistance with personal care: Secondary | ICD-10-CM | POA: Diagnosis not present

## 2023-12-09 DIAGNOSIS — M6281 Muscle weakness (generalized): Secondary | ICD-10-CM | POA: Diagnosis not present

## 2023-12-11 DIAGNOSIS — R2681 Unsteadiness on feet: Secondary | ICD-10-CM | POA: Diagnosis not present

## 2023-12-11 DIAGNOSIS — M6281 Muscle weakness (generalized): Secondary | ICD-10-CM | POA: Diagnosis not present

## 2023-12-11 DIAGNOSIS — N184 Chronic kidney disease, stage 4 (severe): Secondary | ICD-10-CM | POA: Diagnosis not present

## 2023-12-11 DIAGNOSIS — R278 Other lack of coordination: Secondary | ICD-10-CM | POA: Diagnosis not present

## 2023-12-11 DIAGNOSIS — N179 Acute kidney failure, unspecified: Secondary | ICD-10-CM | POA: Diagnosis not present

## 2023-12-11 DIAGNOSIS — Z741 Need for assistance with personal care: Secondary | ICD-10-CM | POA: Diagnosis not present

## 2023-12-12 DIAGNOSIS — Z741 Need for assistance with personal care: Secondary | ICD-10-CM | POA: Diagnosis not present

## 2023-12-12 DIAGNOSIS — N179 Acute kidney failure, unspecified: Secondary | ICD-10-CM | POA: Diagnosis not present

## 2023-12-12 DIAGNOSIS — R278 Other lack of coordination: Secondary | ICD-10-CM | POA: Diagnosis not present

## 2023-12-12 DIAGNOSIS — M6281 Muscle weakness (generalized): Secondary | ICD-10-CM | POA: Diagnosis not present

## 2023-12-12 DIAGNOSIS — R2681 Unsteadiness on feet: Secondary | ICD-10-CM | POA: Diagnosis not present

## 2023-12-12 DIAGNOSIS — N184 Chronic kidney disease, stage 4 (severe): Secondary | ICD-10-CM | POA: Diagnosis not present

## 2023-12-13 DIAGNOSIS — R2681 Unsteadiness on feet: Secondary | ICD-10-CM | POA: Diagnosis not present

## 2023-12-13 DIAGNOSIS — R278 Other lack of coordination: Secondary | ICD-10-CM | POA: Diagnosis not present

## 2023-12-13 DIAGNOSIS — N184 Chronic kidney disease, stage 4 (severe): Secondary | ICD-10-CM | POA: Diagnosis not present

## 2023-12-13 DIAGNOSIS — N179 Acute kidney failure, unspecified: Secondary | ICD-10-CM | POA: Diagnosis not present

## 2023-12-13 DIAGNOSIS — Z741 Need for assistance with personal care: Secondary | ICD-10-CM | POA: Diagnosis not present

## 2023-12-13 DIAGNOSIS — M6281 Muscle weakness (generalized): Secondary | ICD-10-CM | POA: Diagnosis not present

## 2023-12-16 DIAGNOSIS — R278 Other lack of coordination: Secondary | ICD-10-CM | POA: Diagnosis not present

## 2023-12-16 DIAGNOSIS — R2681 Unsteadiness on feet: Secondary | ICD-10-CM | POA: Diagnosis not present

## 2023-12-16 DIAGNOSIS — M6281 Muscle weakness (generalized): Secondary | ICD-10-CM | POA: Diagnosis not present

## 2023-12-16 DIAGNOSIS — N184 Chronic kidney disease, stage 4 (severe): Secondary | ICD-10-CM | POA: Diagnosis not present

## 2023-12-16 DIAGNOSIS — Z741 Need for assistance with personal care: Secondary | ICD-10-CM | POA: Diagnosis not present

## 2023-12-16 DIAGNOSIS — N179 Acute kidney failure, unspecified: Secondary | ICD-10-CM | POA: Diagnosis not present

## 2023-12-17 DIAGNOSIS — N179 Acute kidney failure, unspecified: Secondary | ICD-10-CM | POA: Diagnosis not present

## 2023-12-17 DIAGNOSIS — Z741 Need for assistance with personal care: Secondary | ICD-10-CM | POA: Diagnosis not present

## 2023-12-17 DIAGNOSIS — R2681 Unsteadiness on feet: Secondary | ICD-10-CM | POA: Diagnosis not present

## 2023-12-17 DIAGNOSIS — N184 Chronic kidney disease, stage 4 (severe): Secondary | ICD-10-CM | POA: Diagnosis not present

## 2023-12-17 DIAGNOSIS — R278 Other lack of coordination: Secondary | ICD-10-CM | POA: Diagnosis not present

## 2023-12-17 DIAGNOSIS — M6281 Muscle weakness (generalized): Secondary | ICD-10-CM | POA: Diagnosis not present

## 2023-12-18 DIAGNOSIS — M6281 Muscle weakness (generalized): Secondary | ICD-10-CM | POA: Diagnosis not present

## 2023-12-18 DIAGNOSIS — R278 Other lack of coordination: Secondary | ICD-10-CM | POA: Diagnosis not present

## 2023-12-18 DIAGNOSIS — N179 Acute kidney failure, unspecified: Secondary | ICD-10-CM | POA: Diagnosis not present

## 2023-12-18 DIAGNOSIS — R2681 Unsteadiness on feet: Secondary | ICD-10-CM | POA: Diagnosis not present

## 2023-12-18 DIAGNOSIS — Z741 Need for assistance with personal care: Secondary | ICD-10-CM | POA: Diagnosis not present

## 2023-12-18 DIAGNOSIS — N184 Chronic kidney disease, stage 4 (severe): Secondary | ICD-10-CM | POA: Diagnosis not present

## 2023-12-19 DIAGNOSIS — N179 Acute kidney failure, unspecified: Secondary | ICD-10-CM | POA: Diagnosis not present

## 2023-12-19 DIAGNOSIS — R2681 Unsteadiness on feet: Secondary | ICD-10-CM | POA: Diagnosis not present

## 2023-12-19 DIAGNOSIS — Z741 Need for assistance with personal care: Secondary | ICD-10-CM | POA: Diagnosis not present

## 2023-12-19 DIAGNOSIS — M6281 Muscle weakness (generalized): Secondary | ICD-10-CM | POA: Diagnosis not present

## 2023-12-19 DIAGNOSIS — R278 Other lack of coordination: Secondary | ICD-10-CM | POA: Diagnosis not present

## 2023-12-19 DIAGNOSIS — N184 Chronic kidney disease, stage 4 (severe): Secondary | ICD-10-CM | POA: Diagnosis not present

## 2023-12-20 DIAGNOSIS — R278 Other lack of coordination: Secondary | ICD-10-CM | POA: Diagnosis not present

## 2023-12-20 DIAGNOSIS — M6281 Muscle weakness (generalized): Secondary | ICD-10-CM | POA: Diagnosis not present

## 2023-12-20 DIAGNOSIS — Z741 Need for assistance with personal care: Secondary | ICD-10-CM | POA: Diagnosis not present

## 2023-12-20 DIAGNOSIS — N184 Chronic kidney disease, stage 4 (severe): Secondary | ICD-10-CM | POA: Diagnosis not present

## 2023-12-20 DIAGNOSIS — R2681 Unsteadiness on feet: Secondary | ICD-10-CM | POA: Diagnosis not present

## 2023-12-20 DIAGNOSIS — N179 Acute kidney failure, unspecified: Secondary | ICD-10-CM | POA: Diagnosis not present

## 2023-12-23 DIAGNOSIS — N179 Acute kidney failure, unspecified: Secondary | ICD-10-CM | POA: Diagnosis not present

## 2023-12-23 DIAGNOSIS — R278 Other lack of coordination: Secondary | ICD-10-CM | POA: Diagnosis not present

## 2023-12-23 DIAGNOSIS — M6281 Muscle weakness (generalized): Secondary | ICD-10-CM | POA: Diagnosis not present

## 2023-12-23 DIAGNOSIS — R2681 Unsteadiness on feet: Secondary | ICD-10-CM | POA: Diagnosis not present

## 2023-12-23 DIAGNOSIS — Z741 Need for assistance with personal care: Secondary | ICD-10-CM | POA: Diagnosis not present

## 2023-12-23 DIAGNOSIS — N184 Chronic kidney disease, stage 4 (severe): Secondary | ICD-10-CM | POA: Diagnosis not present

## 2023-12-24 DIAGNOSIS — Z741 Need for assistance with personal care: Secondary | ICD-10-CM | POA: Diagnosis not present

## 2023-12-24 DIAGNOSIS — N184 Chronic kidney disease, stage 4 (severe): Secondary | ICD-10-CM | POA: Diagnosis not present

## 2023-12-24 DIAGNOSIS — R2681 Unsteadiness on feet: Secondary | ICD-10-CM | POA: Diagnosis not present

## 2023-12-24 DIAGNOSIS — M6281 Muscle weakness (generalized): Secondary | ICD-10-CM | POA: Diagnosis not present

## 2023-12-24 DIAGNOSIS — R278 Other lack of coordination: Secondary | ICD-10-CM | POA: Diagnosis not present

## 2023-12-24 DIAGNOSIS — N179 Acute kidney failure, unspecified: Secondary | ICD-10-CM | POA: Diagnosis not present

## 2023-12-25 ENCOUNTER — Ambulatory Visit

## 2023-12-25 DIAGNOSIS — R278 Other lack of coordination: Secondary | ICD-10-CM | POA: Diagnosis not present

## 2023-12-25 DIAGNOSIS — N179 Acute kidney failure, unspecified: Secondary | ICD-10-CM | POA: Diagnosis not present

## 2023-12-25 DIAGNOSIS — R2681 Unsteadiness on feet: Secondary | ICD-10-CM | POA: Diagnosis not present

## 2023-12-25 DIAGNOSIS — Z741 Need for assistance with personal care: Secondary | ICD-10-CM | POA: Diagnosis not present

## 2023-12-25 DIAGNOSIS — N184 Chronic kidney disease, stage 4 (severe): Secondary | ICD-10-CM | POA: Diagnosis not present

## 2023-12-25 DIAGNOSIS — M6281 Muscle weakness (generalized): Secondary | ICD-10-CM | POA: Diagnosis not present

## 2023-12-26 DIAGNOSIS — R2681 Unsteadiness on feet: Secondary | ICD-10-CM | POA: Diagnosis not present

## 2023-12-26 DIAGNOSIS — Z741 Need for assistance with personal care: Secondary | ICD-10-CM | POA: Diagnosis not present

## 2023-12-26 DIAGNOSIS — M6281 Muscle weakness (generalized): Secondary | ICD-10-CM | POA: Diagnosis not present

## 2023-12-26 DIAGNOSIS — N179 Acute kidney failure, unspecified: Secondary | ICD-10-CM | POA: Diagnosis not present

## 2023-12-26 DIAGNOSIS — R278 Other lack of coordination: Secondary | ICD-10-CM | POA: Diagnosis not present

## 2023-12-26 DIAGNOSIS — N184 Chronic kidney disease, stage 4 (severe): Secondary | ICD-10-CM | POA: Diagnosis not present

## 2024-06-13 ENCOUNTER — Emergency Department (HOSPITAL_COMMUNITY)

## 2024-06-13 ENCOUNTER — Inpatient Hospital Stay (HOSPITAL_COMMUNITY)
Admission: EM | Admit: 2024-06-13 | Discharge: 2024-06-15 | DRG: 698 | Disposition: A | Discharge status: Skilled Nursing Facility | Source: Skilled Nursing Facility | Attending: Infectious Diseases | Admitting: Infectious Diseases

## 2024-06-13 DIAGNOSIS — D509 Iron deficiency anemia, unspecified: Secondary | ICD-10-CM | POA: Diagnosis present

## 2024-06-13 DIAGNOSIS — Z951 Presence of aortocoronary bypass graft: Secondary | ICD-10-CM | POA: Diagnosis not present

## 2024-06-13 DIAGNOSIS — Z66 Do not resuscitate: Secondary | ICD-10-CM | POA: Diagnosis present

## 2024-06-13 DIAGNOSIS — H5500 Unspecified nystagmus: Secondary | ICD-10-CM | POA: Insufficient documentation

## 2024-06-13 DIAGNOSIS — N1832 Chronic kidney disease, stage 3b: Secondary | ICD-10-CM | POA: Diagnosis present

## 2024-06-13 DIAGNOSIS — Z8249 Family history of ischemic heart disease and other diseases of the circulatory system: Secondary | ICD-10-CM

## 2024-06-13 DIAGNOSIS — Y846 Urinary catheterization as the cause of abnormal reaction of the patient, or of later complication, without mention of misadventure at the time of the procedure: Secondary | ICD-10-CM | POA: Diagnosis present

## 2024-06-13 DIAGNOSIS — A419 Sepsis, unspecified organism: Secondary | ICD-10-CM | POA: Diagnosis present

## 2024-06-13 DIAGNOSIS — N400 Enlarged prostate without lower urinary tract symptoms: Secondary | ICD-10-CM | POA: Diagnosis present

## 2024-06-13 DIAGNOSIS — G9340 Encephalopathy, unspecified: Secondary | ICD-10-CM | POA: Diagnosis present

## 2024-06-13 DIAGNOSIS — B9620 Unspecified Escherichia coli [E. coli] as the cause of diseases classified elsewhere: Secondary | ICD-10-CM | POA: Diagnosis present

## 2024-06-13 DIAGNOSIS — I5032 Chronic diastolic (congestive) heart failure: Secondary | ICD-10-CM | POA: Diagnosis present

## 2024-06-13 DIAGNOSIS — N390 Urinary tract infection, site not specified: Principal | ICD-10-CM | POA: Diagnosis present

## 2024-06-13 DIAGNOSIS — R4182 Altered mental status, unspecified: Secondary | ICD-10-CM | POA: Diagnosis not present

## 2024-06-13 DIAGNOSIS — R296 Repeated falls: Secondary | ICD-10-CM | POA: Diagnosis present

## 2024-06-13 DIAGNOSIS — R32 Unspecified urinary incontinence: Secondary | ICD-10-CM | POA: Diagnosis present

## 2024-06-13 DIAGNOSIS — R569 Unspecified convulsions: Secondary | ICD-10-CM | POA: Diagnosis not present

## 2024-06-13 DIAGNOSIS — I130 Hypertensive heart and chronic kidney disease with heart failure and stage 1 through stage 4 chronic kidney disease, or unspecified chronic kidney disease: Secondary | ICD-10-CM | POA: Diagnosis present

## 2024-06-13 DIAGNOSIS — E1122 Type 2 diabetes mellitus with diabetic chronic kidney disease: Secondary | ICD-10-CM | POA: Diagnosis present

## 2024-06-13 DIAGNOSIS — D72829 Elevated white blood cell count, unspecified: Secondary | ICD-10-CM | POA: Diagnosis not present

## 2024-06-13 DIAGNOSIS — Z7982 Long term (current) use of aspirin: Secondary | ICD-10-CM

## 2024-06-13 DIAGNOSIS — Z8673 Personal history of transient ischemic attack (TIA), and cerebral infarction without residual deficits: Secondary | ICD-10-CM

## 2024-06-13 DIAGNOSIS — Z888 Allergy status to other drugs, medicaments and biological substances status: Secondary | ICD-10-CM

## 2024-06-13 DIAGNOSIS — I10 Essential (primary) hypertension: Secondary | ICD-10-CM | POA: Diagnosis present

## 2024-06-13 DIAGNOSIS — F03918 Unspecified dementia, unspecified severity, with other behavioral disturbance: Secondary | ICD-10-CM | POA: Diagnosis present

## 2024-06-13 DIAGNOSIS — E1165 Type 2 diabetes mellitus with hyperglycemia: Secondary | ICD-10-CM | POA: Diagnosis present

## 2024-06-13 DIAGNOSIS — M5416 Radiculopathy, lumbar region: Secondary | ICD-10-CM | POA: Diagnosis present

## 2024-06-13 DIAGNOSIS — T83518A Infection and inflammatory reaction due to other urinary catheter, initial encounter: Secondary | ICD-10-CM | POA: Diagnosis present

## 2024-06-13 DIAGNOSIS — Z981 Arthrodesis status: Secondary | ICD-10-CM

## 2024-06-13 DIAGNOSIS — E872 Acidosis, unspecified: Secondary | ICD-10-CM | POA: Diagnosis not present

## 2024-06-13 DIAGNOSIS — H9190 Unspecified hearing loss, unspecified ear: Secondary | ICD-10-CM | POA: Diagnosis present

## 2024-06-13 DIAGNOSIS — E1151 Type 2 diabetes mellitus with diabetic peripheral angiopathy without gangrene: Secondary | ICD-10-CM | POA: Diagnosis present

## 2024-06-13 DIAGNOSIS — Z833 Family history of diabetes mellitus: Secondary | ICD-10-CM

## 2024-06-13 DIAGNOSIS — I2510 Atherosclerotic heart disease of native coronary artery without angina pectoris: Secondary | ICD-10-CM | POA: Diagnosis present

## 2024-06-13 DIAGNOSIS — Z79899 Other long term (current) drug therapy: Secondary | ICD-10-CM

## 2024-06-13 DIAGNOSIS — R0902 Hypoxemia: Secondary | ICD-10-CM | POA: Diagnosis present

## 2024-06-13 DIAGNOSIS — Z820 Family history of epilepsy and other diseases of the nervous system: Secondary | ICD-10-CM

## 2024-06-13 DIAGNOSIS — Z794 Long term (current) use of insulin: Secondary | ICD-10-CM

## 2024-06-13 DIAGNOSIS — G9341 Metabolic encephalopathy: Secondary | ICD-10-CM | POA: Diagnosis present

## 2024-06-13 DIAGNOSIS — Z9621 Cochlear implant status: Secondary | ICD-10-CM

## 2024-06-13 DIAGNOSIS — F0390 Unspecified dementia without behavioral disturbance: Secondary | ICD-10-CM | POA: Diagnosis not present

## 2024-06-13 DIAGNOSIS — Z1152 Encounter for screening for COVID-19: Secondary | ICD-10-CM

## 2024-06-13 LAB — URINALYSIS, W/ REFLEX TO CULTURE (INFECTION SUSPECTED)
Bilirubin Urine: NEGATIVE
Glucose, UA: 50 mg/dL — AB
Ketones, ur: NEGATIVE mg/dL
Nitrite: POSITIVE — AB
Protein, ur: 100 mg/dL — AB
RBC / HPF: >50 RBC/hpf (ref 0–5)
Specific Gravity, Urine: 1.015 (ref 1.005–1.030)
WBC, UA: >50 WBC/hpf (ref 0–5)
pH: 5 (ref 5.0–8.0)

## 2024-06-13 LAB — LACTIC ACID, PLASMA: Lactic Acid, Venous: 1.2 mmol/L (ref 0.5–1.9)

## 2024-06-13 LAB — COMPREHENSIVE METABOLIC PANEL WITH GFR
ALT: 20 U/L (ref 0–44)
AST: 25 U/L (ref 15–41)
Albumin: 4 g/dL (ref 3.5–5.0)
Alkaline Phosphatase: 81 U/L (ref 38–126)
Anion gap: 11 (ref 5–15)
BUN: 51 mg/dL — ABNORMAL HIGH (ref 8–23)
CO2: 23 mmol/L (ref 22–32)
Calcium: 9.8 mg/dL (ref 8.9–10.3)
Chloride: 106 mmol/L (ref 98–111)
Creatinine, Ser: 1.75 mg/dL — ABNORMAL HIGH (ref 0.61–1.24)
GFR, Estimated: 38 mL/min — ABNORMAL LOW
Glucose, Bld: 224 mg/dL — ABNORMAL HIGH (ref 70–99)
Potassium: 4.6 mmol/L (ref 3.5–5.1)
Sodium: 139 mmol/L (ref 135–145)
Total Bilirubin: 0.5 mg/dL (ref 0.0–1.2)
Total Protein: 7.5 g/dL (ref 6.5–8.1)

## 2024-06-13 LAB — CBC WITH DIFFERENTIAL/PLATELET
Abs Immature Granulocytes: 0.05 K/uL (ref 0.00–0.07)
Basophils Absolute: 0 K/uL (ref 0.0–0.1)
Basophils Relative: 0 %
Eosinophils Absolute: 0 K/uL (ref 0.0–0.5)
Eosinophils Relative: 0 %
HCT: 40 % (ref 39.0–52.0)
Hemoglobin: 12.9 g/dL — ABNORMAL LOW (ref 13.0–17.0)
Immature Granulocytes: 1 %
Lymphocytes Relative: 4 %
Lymphs Abs: 0.4 K/uL — ABNORMAL LOW (ref 0.7–4.0)
MCH: 32.3 pg (ref 26.0–34.0)
MCHC: 32.3 g/dL (ref 30.0–36.0)
MCV: 100 fL (ref 80.0–100.0)
Monocytes Absolute: 0.8 K/uL (ref 0.1–1.0)
Monocytes Relative: 8 %
Neutro Abs: 8.7 K/uL — ABNORMAL HIGH (ref 1.7–7.7)
Neutrophils Relative %: 87 %
Platelets: 152 K/uL (ref 150–400)
RBC: 4 MIL/uL — ABNORMAL LOW (ref 4.22–5.81)
RDW: 13.2 % (ref 11.5–15.5)
WBC: 10 K/uL (ref 4.0–10.5)
nRBC: 0 % (ref 0.0–0.2)

## 2024-06-13 LAB — BLOOD GAS, VENOUS
Acid-base deficit: 1.8 mmol/L (ref 0.0–2.0)
Bicarbonate: 23.7 mmol/L (ref 20.0–28.0)
Drawn by: 66579
O2 Saturation: 92.3 %
Patient temperature: 36.7
pCO2, Ven: 41 mmHg — ABNORMAL LOW (ref 44–60)
pH, Ven: 7.36 (ref 7.25–7.43)
pO2, Ven: 59 mmHg — ABNORMAL HIGH (ref 32–45)

## 2024-06-13 LAB — MRSA NEXT GEN BY PCR, NASAL: MRSA by PCR Next Gen: NOT DETECTED

## 2024-06-13 LAB — RESP PANEL BY RT-PCR (RSV, FLU A&B, COVID)  RVPGX2
Influenza A by PCR: NEGATIVE
Influenza B by PCR: NEGATIVE
Resp Syncytial Virus by PCR: NEGATIVE
SARS Coronavirus 2 by RT PCR: NEGATIVE

## 2024-06-13 LAB — GLUCOSE, CAPILLARY: Glucose-Capillary: 200 mg/dL — ABNORMAL HIGH (ref 70–99)

## 2024-06-13 LAB — HEMOGLOBIN A1C
Hgb A1c MFr Bld: 6.9 % — ABNORMAL HIGH (ref 4.8–5.6)
Mean Plasma Glucose: 151.33 mg/dL

## 2024-06-13 LAB — MAGNESIUM: Magnesium: 2.1 mg/dL (ref 1.7–2.4)

## 2024-06-13 LAB — CK: Total CK: 199 U/L (ref 49–397)

## 2024-06-13 LAB — I-STAT CG4 LACTIC ACID, ED: Lactic Acid, Venous: 2.2 mmol/L (ref 0.5–1.9)

## 2024-06-13 LAB — PROTIME-INR
INR: 1.2 (ref 0.8–1.2)
Prothrombin Time: 16 s — ABNORMAL HIGH (ref 11.4–15.2)

## 2024-06-13 LAB — TSH: TSH: 1.15 u[IU]/mL (ref 0.350–4.500)

## 2024-06-13 MED ORDER — ACETAMINOPHEN 650 MG RE SUPP
325.0000 mg | RECTAL | Status: DC | PRN
  Administered 2024-06-13: 325 mg via RECTAL
  Filled 2024-06-13: qty 1

## 2024-06-13 MED ORDER — INSULIN ASPART 100 UNIT/ML IJ SOLN
0.0000 [IU] | Freq: Every day | INTRAMUSCULAR | Status: DC
  Administered 2024-06-14: 2 [IU] via SUBCUTANEOUS
  Filled 2024-06-13: qty 2

## 2024-06-13 MED ORDER — INSULIN ASPART 100 UNIT/ML IJ SOLN
0.0000 [IU] | Freq: Three times a day (TID) | INTRAMUSCULAR | Status: DC
  Administered 2024-06-14: 1 [IU] via SUBCUTANEOUS
  Administered 2024-06-14: 5 [IU] via SUBCUTANEOUS
  Administered 2024-06-14: 2 [IU] via SUBCUTANEOUS
  Administered 2024-06-15: 7 [IU] via SUBCUTANEOUS
  Administered 2024-06-15: 2 [IU] via SUBCUTANEOUS
  Filled 2024-06-13: qty 1
  Filled 2024-06-13: qty 7
  Filled 2024-06-13: qty 5
  Filled 2024-06-13 (×2): qty 2

## 2024-06-13 MED ORDER — TAMSULOSIN HCL 0.4 MG PO CAPS
0.4000 mg | ORAL_CAPSULE | Freq: Every day | ORAL | Status: DC
  Administered 2024-06-14 – 2024-06-15 (×2): 0.4 mg via ORAL
  Filled 2024-06-13 (×2): qty 1

## 2024-06-13 MED ORDER — SERTRALINE HCL 100 MG PO TABS
100.0000 mg | ORAL_TABLET | Freq: Every day | ORAL | Status: DC
  Administered 2024-06-14 – 2024-06-15 (×2): 100 mg via ORAL
  Filled 2024-06-13 (×2): qty 1

## 2024-06-13 MED ORDER — LACTATED RINGERS IV BOLUS (SEPSIS)
1000.0000 mL | Freq: Once | INTRAVENOUS | Status: AC
  Administered 2024-06-13: 1000 mL via INTRAVENOUS

## 2024-06-13 MED ORDER — POLYETHYLENE GLYCOL 3350 17 G PO PACK: 17.0000 g | PACK | Freq: Every day | ORAL | Status: DC | PRN

## 2024-06-13 MED ORDER — SODIUM CHLORIDE 0.9 % IV SOLN
100.0000 mg | Freq: Once | INTRAVENOUS | Status: DC
  Administered 2024-06-13: 100 mg via INTRAVENOUS
  Filled 2024-06-13: qty 100

## 2024-06-13 MED ORDER — FINASTERIDE 5 MG PO TABS
5.0000 mg | ORAL_TABLET | Freq: Every day | ORAL | Status: DC
  Administered 2024-06-14 – 2024-06-15 (×2): 5 mg via ORAL
  Filled 2024-06-13 (×2): qty 1

## 2024-06-13 MED ORDER — CLONIDINE HCL 0.1 MG/24HR TD PTWK: 0.1000 mg | MEDICATED_PATCH | TRANSDERMAL | Status: DC

## 2024-06-13 MED ORDER — ENOXAPARIN SODIUM 40 MG/0.4ML IJ SOSY
40.0000 mg | PREFILLED_SYRINGE | INTRAMUSCULAR | Status: DC
  Administered 2024-06-13 – 2024-06-14 (×2): 40 mg via SUBCUTANEOUS
  Filled 2024-06-13 (×2): qty 0.4

## 2024-06-13 MED ORDER — CARVEDILOL 12.5 MG PO TABS: 25.0000 mg | ORAL_TABLET | Freq: Two times a day (BID) | ORAL | Status: DC

## 2024-06-13 MED ORDER — SODIUM CHLORIDE 0.9 % IV SOLN
2.0000 g | Freq: Once | INTRAVENOUS | Status: AC
  Administered 2024-06-13: 2 g via INTRAVENOUS
  Filled 2024-06-13: qty 20

## 2024-06-13 MED ORDER — SODIUM CHLORIDE 0.9 % IV SOLN: 500.0000 mg | Freq: Once | INTRAVENOUS | Status: DC

## 2024-06-13 MED ORDER — SODIUM CHLORIDE 0.9 % IV SOLN
1.0000 g | INTRAVENOUS | Status: AC
  Administered 2024-06-14 – 2024-06-15 (×2): 1 g via INTRAVENOUS
  Filled 2024-06-13 (×2): qty 10

## 2024-06-13 MED ORDER — INSULIN GLARGINE 100 UNIT/ML ~~LOC~~ SOLN
8.0000 [IU] | Freq: Every day | SUBCUTANEOUS | Status: DC
  Administered 2024-06-13: 8 [IU] via SUBCUTANEOUS
  Filled 2024-06-13 (×2): qty 0.08

## 2024-06-13 MED ORDER — ASPIRIN 81 MG PO TBEC
81.0000 mg | DELAYED_RELEASE_TABLET | Freq: Every morning | ORAL | Status: DC
  Administered 2024-06-14 – 2024-06-15 (×2): 81 mg via ORAL
  Filled 2024-06-13 (×2): qty 1

## 2024-06-13 MED ORDER — ACETAMINOPHEN 325 MG PO TABS
650.0000 mg | ORAL_TABLET | Freq: Four times a day (QID) | ORAL | Status: DC | PRN
  Administered 2024-06-15: 650 mg via ORAL
  Filled 2024-06-13: qty 2

## 2024-06-13 MED ORDER — SENNA 8.6 MG PO TABS: 1.0000 | ORAL_TABLET | Freq: Every evening | ORAL | Status: DC

## 2024-06-13 MED ORDER — SPIRONOLACTONE 25 MG PO TABS: 25.0000 mg | ORAL_TABLET | Freq: Every day | ORAL | Status: DC

## 2024-06-13 MED ORDER — BIOTENE DRY MOUTH MT LIQD: 15.0000 mL | Freq: Four times a day (QID) | OROMUCOSAL | Status: DC | PRN

## 2024-06-13 MED ORDER — LACTATED RINGERS IV SOLN: INTRAVENOUS | Status: DC

## 2024-06-13 NOTE — ED Provider Notes (Signed)
 " [place] [facility] Provider Note   CSN: [id number] Arrival date & time: 06/13/24  1408     Patient presents with: Altered Mental Status   Justin Salinas is a 84 y.o. male.    Altered Mental Status Patient presents with fever and mental status change.  Comes from nursing home.  Sugar is elevated.  Also temperature of tympanic at 100.9.  Reportedly normally walks with a walker with some confusion but now even more confused.  Reportedly were unable to get up.  Patient states he feels bad all over.  Has had a cough.  Has had some mild hypoxia of at the nursing home.     Past Medical History:  Diagnosis Date   Allergy    Anemia    Asthma    Atherosclerosis of abdominal aorta 02/26/2020   Seen on x-ray L-spine September 2021   BPH (benign prostatic hyperplasia)    CAD (coronary artery disease)    a. s/p MI 09/1993;  b. known CTO of RCA;  c. 06/2014 Cath: LM short/mod dzs, LAD 95ost, LCX nl, RI nl, RCA 100, EF 55%;  c. 06/2014 s/p CABG x 4: LIMA->LAD, VG->D1, VG->OM1, VG->Acute Marginal.   Carotid stenosis    a. Carotid US  1/17:  RICA 40-59%; LICA 60-79% // Carotid US  1/22: R 1-39; L 40-59; L VA aberrant flow; bilat subclavian stenosis // Carotid US  1/23: R 1-39, L 40-59; R subclavian stenosis   CKD (chronic kidney disease), stage III (HCC)    Constipation    in the past 2-3 weeks   Diabetes (HCC)    AODM   Diastolic dysfunction    a. 05/2015 Echo: EF 60-65%, LVH, Gr 1 DD, mild MR, mod dil RA/LA, mod TR, PASP .   Essential hypertension    GERD (gastroesophageal reflux disease)    History of kidney stones    Hyperlipidemia     Prior to Admission medications  Medication Sig Start Date End Date Taking? Authorizing Provider  acetaminophen  (TYLENOL ) 325 MG tablet Take 650 mg by mouth every 6 (six) hours as needed for moderate pain (pain score 4-6).    [provider]  alfuzosin  (UROXATRAL ) 10 MG 24 hr tablet Take 10 mg by mouth  daily with breakfast.    [provider]  amLODipine  (NORVASC ) 10 MG tablet TAKE 1 TABLET EVERY DAY 11/22/21   Wonda Sharper, MD  antiseptic oral rinse (BIOTENE) LIQD 15 mLs by Mouth Rinse route 4 (four) times daily as needed for dry mouth.    [provider]  Ascorbic Acid  (VITAMIN C) 1000 MG tablet Take 1,000 mg by mouth daily.    [provider]  aspirin  81 MG tablet Take 81 mg by mouth every morning.    [provider]  carvedilol  (COREG ) 12.5 MG tablet Take 2 tablets (25 mg total) by mouth 2 (two) times daily with a meal. Hold if SBP<105 or HR<60 11/25/23   Verdene Purchase, MD  Cholecalciferol (VITAMIN D3 SUPER STRENGTH) 50 MCG (2000 UT) TABS Take 1 tablet by mouth daily.    [provider]  cloNIDine  (CATAPRES  - DOSED IN MG/24 HR) 0.1 mg/24hr patch Place 0.1 mg onto the skin every Friday.    [provider]  finasteride  (PROSCAR ) 5 MG tablet Take 5 mg by mouth daily. 09/20/23   [provider]  fluticasone (FLONASE) 50 MCG/ACT nasal spray Place 1 spray into both nostrils daily. Patient not taking: Reported on 11/23/2023 09/02/23  [provider]  insulin  lispro (HUMALOG KWIKPEN) 100 UNIT/ML KwikPen Inject 0-10 Units into the skin 3 (three) times daily. Sliding Scale: If BS<70 Call MD, IF BS is 71-150=0 units, 151-200=2 units, 201-250=4 units, 251-300=6 units, 301-350=8 units, 351-400=10 units, BS>400 Call MD    [provider]  LANTUS  SOLOSTAR 100 UNIT/ML Solostar Pen Inject 8 Units into the skin at bedtime. 12/10/22   [provider]  loratadine (CLARITIN) 10 MG tablet Take 10 mg by mouth daily.    [provider]  polyethylene glycol (MIRALAX  / GLYCOLAX ) 17 g packet Take 17 g by mouth daily as needed.    [provider]  repaglinide  (PRANDIN ) 0.5 MG tablet Take 0.5 mg by mouth 3 (three) times daily before meals.    [provider]  risperiDONE  (RISPERDAL ) 0.5 MG tablet Take 0.5 mg  every morning and 1 mg every evening. 11/25/23   Krishnan, Gokul, MD  senna (SENOKOT) 8.6 MG TABS tablet Take 1 tablet by mouth every evening.    [provider]  sertraline  (ZOLOFT ) 100 MG tablet Take 100 mg by mouth daily. 09/21/23   [provider]    Allergies: Exenatide, Linagliptin , and Lasix  [furosemide ]    Review of Systems  Updated Vital Signs BP (!) 192/66 (BP Location: Right Arm)   Pulse 83   Temp (!) 102 F (38.9 C) (Rectal)   Resp (!) 24   SpO2 100%   Physical Exam Vitals and nursing note reviewed.  Cardiovascular:     Rate and Rhythm: Normal rate.  Pulmonary:     Comments: Mildly harsh breath sound Abdominal:     Tenderness: There is abdominal tenderness.     Comments: Mild abdominal tenderness.  Musculoskeletal:        General: No tenderness.  Neurological:     Mental Status: He is alert.     Comments: Awake and answers some questions but appears to have some confusion.     (all labs ordered are listed, but only abnormal results are displayed) Labs Reviewed  COMPREHENSIVE METABOLIC PANEL WITH GFR - Abnormal; Notable for the following components:      Result Value   Glucose, Bld 224 (*)    BUN 51 (*)    Creatinine, Ser 1.75 (*)    GFR, Estimated 38 (*)    All other components within normal limits  CBC WITH DIFFERENTIAL/PLATELET - Abnormal; Notable for the following components:   RBC 4.00 (*)    Hemoglobin 12.9 (*)    Neutro Abs 8.7 (*)    Lymphs Abs 0.4 (*)    All other components within normal limits  PROTIME-INR - Abnormal; Notable for the following components:   Prothrombin Time 16.0 (*)    All other components within normal limits  URINALYSIS, W/ REFLEX TO CULTURE (INFECTION SUSPECTED) - Abnormal; Notable for the following components:   APPearance CLOUDY (*)    Glucose, UA 50 (*)    Hgb urine dipstick LARGE (*)    Protein, ur 100 (*)    Nitrite POSITIVE (*)    Leukocytes,Ua LARGE (*)    Bacteria, UA MANY (*)    All other  components within normal limits  I-STAT CG4 LACTIC ACID, ED - Abnormal; Notable for the following components:   Lactic Acid, Venous 2.2 (*)    All other components within normal limits  RESP PANEL BY RT-PCR (RSV, FLU A&B, COVID)  RVPGX2  CULTURE, BLOOD (ROUTINE X 2)  CULTURE, BLOOD (ROUTINE X 2)  URINE CULTURE  EKG: EKG Interpretation Date/Time:  Saturday June 13 2024 14:37:13 EST Ventricular Rate:  83 PR Interval:  220 QRS Duration:  91 QT Interval:  366 QTC Calculation: 430 R Axis:   75  Text Interpretation: Sinus rhythm Prolonged PR interval Anterior infarct, old Minimal ST depression, lateral leads Confirmed by Patsey Lot [phone]) on 06/13/2024 3:27:16 PM  Radiology: [facility] Chest Port 1 View Result Date: 06/13/2024 CLINICAL DATA:  Sepsis, altered level of consciousness EXAM: PORTABLE CHEST 1 VIEW COMPARISON:  11/23/2023 FINDINGS: Single frontal view of the chest demonstrates postsurgical changes from CABG. Cardiac silhouette is unremarkable. No airspace disease, effusion, or pneumothorax. No acute bony abnormalities. IMPRESSION: 1. No acute intrathoracic process. Electronically Signed   By: Ozell Daring M.D.   On: 06/13/2024 15:21     Procedures   Medications Ordered in the ED  lactated ringers  infusion ( Intravenous New Bag/Given 06/13/24 1501)  lactated ringers  bolus 1,000 mL (1,000 mLs Intravenous New Bag/Given 06/13/24 1500)  cefTRIAXone  (ROCEPHIN ) 2 g in sodium chloride  0.9 % 100 mL IVPB (2 g Intravenous New Bag/Given 06/13/24 1505)  doxycycline  (VIBRAMYCIN ) 100 mg in sodium chloride  0.9 % 250 mL IVPB (has no administration in time range)    Clinical Course as of 06/13/24 1528  Sat Jun 13, 2024  1523 Discussed with patient's daughter.  Had been doing well on Wednesday.  Urinalysis does not show infection.  The Rocephin  given for potential pneumonia should cover UTI. [NP]    Clinical Course User Index [NP] Patsey Lot, MD                                  Medical Decision Making Amount and/or Complexity of Data Reviewed Labs: ordered. Radiology: ordered.  Risk Prescription drug management. Decision regarding hospitalization.   Patient with mental status change.  Found to be febrile upon arrival.  Differential diagnosis fluids.  Signs of infection.  However with mild hypoxia pneumonia considered.  Will give Rocephin  and azithromycin .  Will also get flu and COVID testing since that is prevalent at this time.  Will give small fluid bolus.  If other clear source of infection does not show up may need further imaging of the abdomen.  White count reassuring.  Creatinine appears to be about baseline.  Lactic acid is mildly elevated.  Urine does appear to show infection.  Chest x-ray does not show infection.  Rocephin  given for potential pneumonia should cover UTI.  Will discuss with family practice for admission.  CRITICAL CARE Performed by: Lot Patsey Total critical care time: 30 minutes Critical care time was exclusive of separately billable procedures and treating other patients. Critical care was necessary to treat or prevent imminent or life-threatening deterioration. Critical care was time spent personally by me on the following activities: development of treatment plan with patient and/or surrogate as well as nursing, discussions with consultants, evaluation of patient's response to treatment, examination of patient, obtaining history from patient or surrogate, ordering and performing treatments and interventions, ordering and review of laboratory studies, ordering and review of radiographic studies, pulse oximetry and re-evaluation of patient's condition.      Final diagnoses:  Urinary tract infection without hematuria, site unspecified    ED Discharge Orders     None          Patsey Lot, MD 06/13/24 1528  "

## 2024-06-13 NOTE — Sepsis Progress Note (Signed)
 Sepsis protocol is being followed by [facility].

## 2024-06-13 NOTE — Progress Notes (Signed)
 EEG complete - results pending

## 2024-06-13 NOTE — Sepsis Progress Note (Signed)
 Notified bedside RN of need for repeat lactic acid.

## 2024-06-13 NOTE — Hospital Course (Addendum)
 Was ok on Wednesday Fever 102 Mild hypoxia   RBC>50 Large leukocytes  Large hemoglbinuria  Cr 1.75--> 1.76  Gluc 224 WBC 10 Lactic acid 2.2  CXR: normal   Needs bladder scan  Ins and outs  Lactic acid  Coronary artery disease  S/p PCI in 1990s S/p CABG in 12/15 Hypertension  Hyperlipidemia  Diabetes mellitus  Chronic kidney disease  Carotid artery dz  VBG?     Alfuzosin  10 mg daily ASA 81 mg Biotene Dry mouth Coreg  25 mg BID Clonidine  patch weekly Finasteride  5mg   Flonase Humalog SSI Lantus  8 units at bedtime Loratadine 10 mg every morning Miralax  daily prn Repaglinide  0.5 mg before meals Risperidone  0.5 mg every morning and 1 mg every night  Senna 8.6 mg every evening Spironolactone  25 mg daily Tylenol  prn Vitamin C Vitmain D Zoloft  100 mg daily Lidocaine  patch prn

## 2024-06-13 NOTE — ED Triage Notes (Signed)
 Bib [facility] from [facility] for concerns of AMS. Normally walks with a walker and slightly confused at baseline but today not responding verbally, eating or standing up. Patient given 10 units of insulin  by nursing staff prior to EMS departure.  198/78 initially 160/90 repeat HR 80  97% on 2L CBG 361

## 2024-06-13 NOTE — Procedures (Signed)
 Patient Name: Justin Salinas  MRN: [id number]  Epilepsy Attending: Arlin MALVA Krebs  Referring Physician/Provider: Kandis Perkins, DO  Date: 06/13/2024 Duration: 22.21 mins  Patient history: 84yo M with ams. EG to evaluate for seizure  Level of alertness: Awake  AEDs during EEG study: None  Technical aspects: This EEG study was done with scalp electrodes positioned according to the 10-20 International system of electrode placement. Electrical activity was reviewed with band pass filter of 1-70Hz , sensitivity of 7 uV/mm, display speed of 1mm/sec with a 60Hz  notched filter applied as appropriate. EEG data were recorded continuously and digitally stored.  Video monitoring was available and reviewed as appropriate.  Description: EEG showed continuous generalized 3 to 6 Hz theta-delta slowing. Hyperventilation and photic stimulation were not performed.     ABNORMALITY - Continuous slow, generalized  IMPRESSION: This study is suggestive of generalized cerebral dysfunction (encephalopathy). No seizures or epileptiform discharges were seen throughout the recording.  Justin Salinas

## 2024-06-13 NOTE — H&P (Signed)
 " Date: 06/13/2024               Patient Name:  Justin Salinas MRN: [id number]  DOB: 02/13/1941 Age / Sex: 84 y.o., male   PCP: Kennyth Worth HERO, MD         Medical Service: Internal Medicine Teaching Service         Attending Physician: Dr. MICAEL Riis Winfrey      First Contact: Remonia Romano, DO}    Second Contact: Dr. Damien Lease, DO         Pager Information: First Contact Pager: [phone]   Second Contact Pager: [phone]   SUBJECTIVE   Chief Complaint: AMS   History of Present Illness: Justin Salinas is a 84 y.o. male with PMH of CAD status post CABG x 4, controlled type 2 diabetes mellitus, essential hypertension, carotid artery disease, BPH, hypertension, lumbar radiculopathy, Cognitive decline, chronic diastolic heart failure, dementia that presents today for altered mental status from [facility].  Tried to call facility but was unable to reach and left voicemail. Daughter is at bedside and states that she will ask facility to give us  a call back with more information. Daughter states that patient was not himself starting this morning from the facility. Also was reported to have fever by the facility.  Daughter states that this never had this before. She states he usually gets up to the bathroom by herself. Daughter does report that patient was having increasing incontinence issues over the past week. Daughter states that the tremors are new. No new weakness that she is aware of . No new bowel incontinence. No sick contacts or cough.No new chest pain that we are aware of. Daughter overall states that patient has been increasingly confused over the last 3 weeks significantly that coincided with incontinence.Patient has family history of alzheimer's dementia in brother and sister.    ED Course: Vital signs on arrival were 198/78 with repeat of 160/90, heart rate 80, 97% on 2 L, CBG 361 Labs significant for glucose 224,  Cr 1.75 UA showed large hemoglobin urine  dipstick, 50 glucose, 100 protein, no ketones, positive nitrites and large leukocytes with greater than 50 white blood cells and many bacteria Respiratory panel negative  Lactic acid 2.2 Imaging chest x-ray shows no acute cardiopulmonary process Received 1 L LR, ceftriaxone  2 g, doxycycline  100 Consulted IMTS   Past Medical History CAD s/p CABGx4  Hypertension BPH CKD stage Type 2 diabetes mellitus Iron deficiency anemia hearing loss Posterior vitreous detachment of the left eye Recurrent falls Cognitive decline Chronic diastolic CHF Carotid artery disease   Past Surgical History Past Surgical History:  Procedure Laterality Date   ANGIOPLASTY  09/22/1993   POBA of RCA (Dr. FABIENE Pinion)   BACK SURGERY  2000   back fusion - Dr. Victory Gunnels   CARDIAC CATHETERIZATION  11/28/1998   silent occlusion of RCA w/collaterals from left coronary system, prox LAD w/40-50% eccentric narrowing, 1st diagonal with 70-80% eccentric narrowing, 85% narrowing of prox small OM1 (Dr. FABIENE Pinion)   CAROTID DOPPLER  07/2011   left subclavian (50-69%); right bulb (0-49%); RICA (normal); left mid-distal CCA (0-49%); left bulb/prox ICA (50-69%); left vertebral with abnormal antegrade flow   CORONARY ARTERY BYPASS GRAFT N/A 05/21/2014   Procedure: CORONARY ARTERY BYPASS GRAFTING (CABG) x  four, using left internal mammary artery and right leg greater saphenous vein harvested endoscopically;  Surgeon: Elspeth JAYSON Millers, MD;  Location: [facility] OR;  Service: Open Heart Surgery;  Laterality: N/A;   INTRAOPERATIVE TRANSESOPHAGEAL ECHOCARDIOGRAM N/A 05/21/2014   Procedure: INTRAOPERATIVE TRANSESOPHAGEAL ECHOCARDIOGRAM;  Surgeon: Elspeth JAYSON Millers, MD;  Location[facility] OR;  Service: Open Heart Surgery;  Laterality: N/A;   LEFT HEART CATHETERIZATION WITH CORONARY ANGIOGRAM N/A 05/19/2014   Procedure: LEFT HEART CATHETERIZATION WITH CORONARY ANGIOGRAM;  Surgeon: Ozell JONETTA Fell, MD;  Location: [facility] CATH LAB;  Service:  Cardiovascular;  Laterality: N/A;   LUMBAR LAMINECTOMY/DECOMPRESSION MICRODISCECTOMY Left 09/23/2020   Procedure: Laminectomy and Foraminotomy - left - Lumbar three-Lumbar four;  Surgeon: Louis Shove, MD;  Location: [facility] OR;  Service: Neurosurgery;  Laterality: Left;   NASAL SINUS SURGERY  2010   NM MYOCAR PERF WALL MOTION  09/19/2009   bruce myoview - mild perfusion defect in basal inferior region (infarct/scar), EF 60%, low risk scan   RENAL DOPPLER  10/2011   SMA w/ 70-99% diameter reduction & high grade stenosis; R&L renals w/narrowing and increased velocities (60-99%), R kidney smaller than L   TRANSTHORACIC ECHOCARDIOGRAM  10/20/2012   EF 55-60%, moderate concentric hypertrophy, ventricular septum thickness increased, calcified MV annulus   VASECTOMY  1963    Meds:  Active Medications[1] Alfuzosin  10 mg daily ASA 81 mg Biotene Dry mouth Coreg  25 mg BID Clonidine  patch weekly Finasteride  5mg   Flonase Humalog SSI Lantus  8 units at bedtime Loratadine 10 mg every morning Miralax  daily prn Repaglinide  0.5 mg before meals Risperidone  0.5 mg every morning and 1 mg every night  Senna 8.6 mg every evening Spironolactone  25 mg daily Tylenol  prn Vitamin C Vitmain D Zoloft  100 mg daily Lidocaine  patch prn   Social:  Lives With: assisted living facility  Occupation:retired  Support: daughter  Level of Function:walker at baseline  PCP: Dr. Pia  Substances: -Tobacco: none -Alcohol: none -Recreational Drug: none  Family History:  Family History  Problem Relation Age of Onset   Uterine cancer Mother    Stroke Father    Diabetes Sister    Colon polyps Brother    Heart disease Brother    Colon cancer Neg Hx    Esophageal cancer Neg Hx    Rectal cancer Neg Hx    Stomach cancer Neg Hx      Allergies: Allergies as of 06/13/2024 - Review Complete 06/13/2024  Allergen Reaction Noted   Exenatide Other (See Comments) 09/05/2019   Linagliptin  Other (See Comments) 09/05/2019    Lasix  [furosemide ] Other (See Comments) 10/04/2014    Review of Systems: A complete ROS was negative except as per HPI.   OBJECTIVE:   Physical Exam: Blood pressure (!) 192/66, pulse 83, temperature (!) 102 F (38.9 C), temperature source Rectal, resp. rate (!) 24, SpO2 100%.  Constitutional: elderly male in some distress in bed,  in mild distress shaking  HENT: normocephalic atraumatic, mucous membranes moist Eyes: conjunctiva non-erythematous, pupils equal, bidirectional nystagmus  Neck: supple Cardiovascular: regular rate and rhythm, no murmurs appreciated  Pulmonary/Chest: was on room air but was desaturating turned up oxygen, no wheezes or rhonchi appreciated.  Abdominal: soft, non-tender, distension noted near bladder area  MSK: normal bulk , increased rigidity in upper and lower extremities  Neurological: answers to name and some commands, patient is in mitts and is grabbing at things. Able to move right leg spontaneously. Appears tremulous and has cogwheel rigidity in upper extremities. And rigidity in lower extremities when trying for passive range of motion.   Skin: warm and dry Psych: agitated, not able to redirect  Labs: CBC    Component Value Date/Time  WBC 10.0 06/13/2024 1430   RBC 4.00 (L) 06/13/2024 1430   HGB 12.9 (L) 06/13/2024 1430   HCT 40.0 06/13/2024 1430   PLT 152 06/13/2024 1430   MCV 100.0 06/13/2024 1430   MCH 32.3 06/13/2024 1430   MCHC 32.3 06/13/2024 1430   RDW 13.2 06/13/2024 1430   LYMPHSABS 0.4 (L) 06/13/2024 1430   MONOABS 0.8 06/13/2024 1430   EOSABS 0.0 06/13/2024 1430   BASOSABS 0.0 06/13/2024 1430     CMP     Component Value Date/Time   NA 139 06/13/2024 1430   NA 139 03/12/2023 0000   K 4.6 06/13/2024 1430   CL 106 06/13/2024 1430   CO2 23 06/13/2024 1430   GLUCOSE 224 (H) 06/13/2024 1430   BUN 51 (H) 06/13/2024 1430   BUN 49 (A) 03/12/2023 0000   CREATININE 1.75 (H) 06/13/2024 1430   CALCIUM  9.8 06/13/2024 1430   PROT 7.5  06/13/2024 1430   ALBUMIN  4.0 06/13/2024 1430   AST 25 06/13/2024 1430   ALT 20 06/13/2024 1430   ALKPHOS 81 06/13/2024 1430   BILITOT 0.5 06/13/2024 1430   GFRNONAA 38 (L) 06/13/2024 1430   GFRAA 46 (L) 06/19/2018 0922    Imaging:  EEG adult Result Date: 06/13/2024 Justin Arlin KIDD, MD     06/13/2024  5:56 PM Patient Name: Justin Salinas MRN: [id number] Epilepsy Attending: Arlin Salinas Justin Referring Physician/Provider: Kandis Perkins, DO Date: 06/13/2024 Duration: 22.21 mins Patient history: 84yo M with ams. EG to evaluate for seizure Level of alertness: Awake AEDs during EEG study: None Technical aspects: This EEG study was done with scalp electrodes positioned according to the 10-20 International system of electrode placement. Electrical activity was reviewed with band pass filter of 1-70Hz , sensitivity of 7 uV/mm, display speed of 35mm/sec with a 60Hz  notched filter applied as appropriate. EEG data were recorded continuously and digitally stored.  Video monitoring was available and reviewed as appropriate. Description: EEG showed continuous generalized 3 to 6 Hz theta-delta slowing. Hyperventilation and photic stimulation were not performed.   ABNORMALITY - Continuous slow, generalized IMPRESSION: This study is suggestive of generalized cerebral dysfunction (encephalopathy). No seizures or epileptiform discharges were seen throughout the recording. Arlin Salinas Justin   DG Chest Port 1 View Result Date: 06/13/2024 CLINICAL DATA:  Sepsis, altered level of consciousness EXAM: PORTABLE CHEST 1 VIEW COMPARISON:  11/23/2023 FINDINGS: Single frontal view of the chest demonstrates postsurgical changes from CABG. Cardiac silhouette is unremarkable. No airspace disease, effusion, or pneumothorax. No acute bony abnormalities. IMPRESSION: 1. No acute intrathoracic process. Electronically Signed   By: Ozell Daring M.D.   On: 06/13/2024 15:21     EKG: personally reviewed my interpretation is sinus rhythm  although prolonged PR . Prior EKG is the same   ASSESSMENT & PLAN:   Assessment & Plan by Problem: Principal Problem:   Encephalopathy Active Problems:   Essential hypertension   CAD (coronary artery disease)   BPH (benign prostatic hyperplasia)   Type 2 diabetes mellitus with hyperglycemia (HCC)   Chronic diastolic CHF (congestive heart failure) (HCC)   Dementia with behavioral disturbance (HCC)   Nystagmus   UTI (urinary tract infection)   Justin Salinas is a 84 y.o. male with PMH of CAD status post CABG x 4, controlled type 2 diabetes mellitus, essential hypertension, carotid artery disease, BPH, hypertension, lumbar radiculopathy, Cognitive decline, chronic diastolic heart failure, dementia that presents today for altered mental status starting this morning and admitted for encephalopathy.   #  encephalopathy  Patient came in and able to respond to name and open eyes spontaneously at times without any sternal rub.  He is tremulous all over and has rigidity on exam.  Pupils look equal.  No focal deficits that we are aware of but hard to examine as patient is not able to follow commands.UA has positive nitrates and large leukocytes with greater than 50 WBC and many bacteria.  This is from In-N-Out cath.  Patient's daughter states that he has had UTIs before but never presented like this.  This was sudden onset mental status change in the morning.  Baseline seems to be that the patient is able to have conversation.  With patient's rigidity, altered mental status,fever and being on second-generation antipsychotic was concerned for NMS. Treatment for this would be dantrolene but after discussing with pharmacy, usually presents with more elevated temperature and patient has been stable on second generation antipsychotic. Noted to have bidirectional nystagmus on exam.  Query brainstem infarct although no obvious focal deficits.  Electrolytes, CMP, CK, TSH all are normal.  Spot EEG was  normal -Neuro recommendations. Recommended MRI with risk factors. Follow up with neuro if abnormal. Appreciate recommendations.  -Pending ammonia, RPR, VBG - Will treat right now for UTI with ceftriaxone  5 days -Follow urine culture - Delirium precautions - Renal ultrasound as bladder scan was not showing good picture of bladder to assess for urinary retention  #Lactic acidosis At 2.2.  Patient was brought in as code sepsis.  Was given a 1 L of fluids in the ED - follow up repeat lactic acid   #Sepsis Likely secondary to UTI. No leukocytosis, or tachycardia noted.  With UA,  code sepsis was activated.  Will treat for 5 days with ceftriaxone .  Can transition to orals once patient is more mentally alert. - follow urine culture   #Dementia Patient was on risperidone   . 5 mg every morning and 1 mg every evening. Sertraline  100 mg daily  - First was thinking NMS, but then rigidity got better after tylenol . Will hold on risperidone  and sertraline  until mental status improves - delirium precautions in  #HTN Elevated upon arrival but no concern for hypertensive urgency or emergency at this time with repeats. - Takes clonidine  patch, carvedilol  25 twice daily, spironolactone  25 mg -Started back on carvedilol , spironolactone  and clonidine  patch   #BPH  On finasteride  5mg   and alfuzosin  10 mg daily at home. - Will continue finasteride  5 mg daily and switch to tamsulosin  0.4 mg daily here in the hospital.  Would likely recommend on discharge as well -Follow-up renal ultrasound  # Type 2 diabetes mellitus Last checked and April and was shown to be 7. Lower differential for DKA with no anion gap although blood sugars elevated in the setting of infection.  -Sensitive insulin  sliding scale in  -lantus  8 units daily at bedtime   #CKD stage 3b Creatinine appears to be at baseline. 1.75   #CAD s/p CABG x4  #PVD  Continue aspirin  81 mg. No acute concerns for ischemic changes. No chest pain  reported previously   #chronic diastolic CHF  No acute concerns for exacerbation. EF 60 to 65%, asymmetric LVH of septal segment. Grade 1 diastolic dysfunction   Best practice: Diet: NPO pending bedside swallow  VTE: Enoxaparin  IVF: Received 1L  Code: DNR/DNI  Disposition planning: Prior to Admission Living Arrangement: ALF  Anticipated Discharge Location: pending  Barriers to Discharge: improving mental status   Dispo: Admit patient to Inpatient with  expected length of stay greater than 2 midnights.  Signed: D'Mello, Kyra Laffey, DO Internal Medicine Resident  06/13/2024, 7:10 PM  Please contact IM Residency On-Call Pager at: [phone] or [phone].      [1]  Current Meds  Medication Sig   acetaminophen  (TYLENOL ) 325 MG tablet Take 650 mg by mouth every 6 (six) hours as needed for moderate pain (pain score 4-6).   alfuzosin  (UROXATRAL ) 10 MG 24 hr tablet Take 10 mg by mouth daily with breakfast.   antiseptic oral rinse (BIOTENE) LIQD 15 mLs by Mouth Rinse route 4 (four) times daily as needed for dry mouth.   Ascorbic Acid  (VITAMIN C) 1000 MG tablet Take 1,000 mg by mouth daily.   aspirin  81 MG tablet Take 81 mg by mouth every morning.   carvedilol  (COREG ) 25 MG tablet Take 25 mg by mouth 2 (two) times daily.   Cholecalciferol (VITAMIN D3 SUPER STRENGTH) 50 MCG (2000 UT) TABS Take 1 tablet by mouth daily.   cloNIDine  (CATAPRES  - DOSED IN MG/24 HR) 0.1 mg/24hr patch Place 0.1 mg onto the skin every Friday.   finasteride  (PROSCAR ) 5 MG tablet Take 5 mg by mouth daily.   fluticasone (FLONASE) 50 MCG/ACT nasal spray Place 1 spray into both nostrils daily.   Glucose-Vitamin C (DEX4 POUCH PACK PO) Take 15 g by mouth daily as needed (hypoglycemia).   insulin  lispro (HUMALOG KWIKPEN) 100 UNIT/ML KwikPen Inject 0-10 Units into the skin See admin instructions. Take 3 times daily per sliding scale: If BS<70 Call MD,  IF BS is 71-150=0 units,  151-200=2 units,  201-250=4 units,   251-300=6 units,  301-350=8 units,  351-400=10 units,  BS>400 Call MD   LANTUS  SOLOSTAR 100 UNIT/ML Solostar Pen Inject 8 Units into the skin at bedtime.   loratadine (CLARITIN) 10 MG tablet Take 10 mg by mouth daily.   polyethylene glycol (MIRALAX  / GLYCOLAX ) 17 g packet Take 17 g by mouth daily as needed.   repaglinide  (PRANDIN ) 0.5 MG tablet Take 0.5 mg by mouth 3 (three) times daily before meals.   risperiDONE  (RISPERDAL ) 0.5 MG tablet Take 0.5 mg every morning and 1 mg every evening. (Patient taking differently: Take 0.5 mg by mouth in the morning.)   risperiDONE  (RISPERDAL ) 1 MG tablet Take 1 mg by mouth at bedtime.   senna (SENOKOT) 8.6 MG TABS tablet Take 1 tablet by mouth every evening.   sertraline  (ZOLOFT ) 100 MG tablet Take 100 mg by mouth daily.   spironolactone  (ALDACTONE ) 25 MG tablet Take 25 mg by mouth daily.   "

## 2024-06-14 ENCOUNTER — Inpatient Hospital Stay (HOSPITAL_COMMUNITY)

## 2024-06-14 LAB — CBC
HCT: 32 % — ABNORMAL LOW (ref 39.0–52.0)
Hemoglobin: 10.7 g/dL — ABNORMAL LOW (ref 13.0–17.0)
MCH: 33.3 pg (ref 26.0–34.0)
MCHC: 33.4 g/dL (ref 30.0–36.0)
MCV: 99.7 fL (ref 80.0–100.0)
Platelets: 122 K/uL — ABNORMAL LOW (ref 150–400)
RBC: 3.21 MIL/uL — ABNORMAL LOW (ref 4.22–5.81)
RDW: 13.4 % (ref 11.5–15.5)
WBC: 15.3 K/uL — ABNORMAL HIGH (ref 4.0–10.5)
nRBC: 0 % (ref 0.0–0.2)

## 2024-06-14 LAB — COMPREHENSIVE METABOLIC PANEL WITH GFR
ALT: 21 U/L (ref 0–44)
AST: 26 U/L (ref 15–41)
Albumin: 3.1 g/dL — ABNORMAL LOW (ref 3.5–5.0)
Alkaline Phosphatase: 66 U/L (ref 38–126)
Anion gap: 8 (ref 5–15)
BUN: 50 mg/dL — ABNORMAL HIGH (ref 8–23)
CO2: 24 mmol/L (ref 22–32)
Calcium: 8.9 mg/dL (ref 8.9–10.3)
Chloride: 108 mmol/L (ref 98–111)
Creatinine, Ser: 1.68 mg/dL — ABNORMAL HIGH (ref 0.61–1.24)
GFR, Estimated: 40 mL/min — ABNORMAL LOW
Glucose, Bld: 241 mg/dL — ABNORMAL HIGH (ref 70–99)
Potassium: 5 mmol/L (ref 3.5–5.1)
Sodium: 140 mmol/L (ref 135–145)
Total Bilirubin: 0.4 mg/dL (ref 0.0–1.2)
Total Protein: 6 g/dL — ABNORMAL LOW (ref 6.5–8.1)

## 2024-06-14 LAB — GLUCOSE, CAPILLARY
Glucose-Capillary: 194 mg/dL — ABNORMAL HIGH (ref 70–99)
Glucose-Capillary: 132 mg/dL — ABNORMAL HIGH (ref 70–99)
Glucose-Capillary: 250 mg/dL — ABNORMAL HIGH (ref 70–99)
Glucose-Capillary: 252 mg/dL — ABNORMAL HIGH (ref 70–99)
Glucose-Capillary: 233 mg/dL — ABNORMAL HIGH (ref 70–99)

## 2024-06-14 LAB — SYPHILIS: RPR W/REFLEX TO RPR TITER AND TREPONEMAL ANTIBODIES, TRADITIONAL SCREENING AND DIAGNOSIS ALGORITHM: RPR Ser Ql: NONREACTIVE

## 2024-06-14 LAB — MAGNESIUM: Magnesium: 2 mg/dL (ref 1.7–2.4)

## 2024-06-14 LAB — AMMONIA: Ammonia: 19 umol/L (ref 9–35)

## 2024-06-14 MED ORDER — ROSUVASTATIN CALCIUM 20 MG PO TABS
20.0000 mg | ORAL_TABLET | Freq: Every day | ORAL | Status: DC
  Administered 2024-06-14 – 2024-06-15 (×2): 20 mg via ORAL
  Filled 2024-06-14 (×2): qty 1

## 2024-06-14 MED ORDER — LORAZEPAM 0.5 MG PO TABS
0.5000 mg | ORAL_TABLET | Freq: Once | ORAL | Status: AC | PRN
  Administered 2024-06-14: 0.5 mg via ORAL
  Filled 2024-06-14: qty 1

## 2024-06-14 MED ORDER — CLONIDINE HCL 0.1 MG/24HR TD PTWK
0.1000 mg | MEDICATED_PATCH | TRANSDERMAL | Status: DC
  Administered 2024-06-14: 0.1 mg via TRANSDERMAL
  Filled 2024-06-14: qty 1

## 2024-06-14 MED ORDER — INSULIN GLARGINE 100 UNIT/ML ~~LOC~~ SOLN
10.0000 [IU] | Freq: Every day | SUBCUTANEOUS | Status: DC
  Administered 2024-06-14: 10 [IU] via SUBCUTANEOUS
  Filled 2024-06-14 (×2): qty 0.1

## 2024-06-14 MED ORDER — AMLODIPINE BESYLATE 5 MG PO TABS
10.0000 mg | ORAL_TABLET | Freq: Every day | ORAL | Status: DC
  Administered 2024-06-14 – 2024-06-15 (×2): 10 mg via ORAL
  Filled 2024-06-14 (×2): qty 2

## 2024-06-14 NOTE — Progress Notes (Addendum)
 "  HD#1 SUBJECTIVE:  Patient Summary: Justin Salinas is a 84 y.o. male with PMH of CAD status post CABG x 4, controlled type 2 diabetes mellitus, essential hypertension, carotid artery disease, BPH, hypertension, lumbar radiculopathy, Cognitive decline, chronic diastolic heart failure, dementia that presents today for altered mental status starting this morning and admitted for encephalopathy.   Overnight Events: overnight improved. Resting comfortably and not rigid. Bladder volume 27 mL, following some commands and AxO to name only   Interim History: Patient is alert to name,place, month and wasn't sure about the year but said 22. Patient states he feels  dazy.   OBJECTIVE:  Vital Signs: Vitals:   06/13/24 2318 06/14/24 0300 06/14/24 0338 06/14/24 0752  BP: (!) 135/45  (!) 171/67 (!) 144/56  Pulse: 69  68 65  Resp: 18  18 18   Temp: 97.6 F (36.4 C)  (!) (P) 97.5 F (36.4 C) 97.9 F (36.6 C)  TempSrc: Axillary  (P) Oral   SpO2: 98% 96% 100% 98%   Supplemental O2: Room Air SpO2: 98 % O2 Flow Rate (L/min): 2 L/min  There were no vitals filed for this visit.   Intake/Output Summary (Last 24 hours) at 06/14/2024 0937 Last data filed at 06/13/2024 1730 Gross per 24 hour  Intake 1302.94 ml  Output --  Net 1302.94 ml   Net IO Since Admission: 1,302.94 mL [06/14/24 0937]  Physical Exam: Physical Exam Eyes:     Comments: Bidirectional sustained nystagmus   Cardiovascular:     Rate and Rhythm: Normal rate and regular rhythm.     Heart sounds: No murmur heard. Pulmonary:     Effort: Pulmonary effort is normal. No respiratory distress.     Breath sounds: No wheezing.  Abdominal:     General: Abdomen is flat.  Musculoskeletal:     Comments: Some cogwheel rigidity in upper extremities   Mild Lead pipe rigidity in lower extremities   Neurological:     Mental Status: He is alert.  Psychiatric:        Mood and Affect: Mood normal.     Patient Lines/Drains/Airways  Status     Active Line/Drains/Airways     Name Placement date Placement time Site Days   Peripheral IV 06/13/24 18 G Anterior;Right;Upper Arm 06/13/24  1430  Arm  1   Peripheral IV 06/13/24 20 G Anterior;Distal;Left Forearm 06/13/24  1500  Forearm  1   External Urinary Catheter 06/13/24  1810  --  1            Pertinent labs and imaging:      Latest Ref Rng & Units 06/14/2024    1:25 AM 06/13/2024    2:30 PM 11/25/2023    4:26 AM  CBC  WBC 4.0 - 10.5 K/uL 15.3  10.0  6.3   Hemoglobin 13.0 - 17.0 g/dL 89.2  87.0  89.5   Hematocrit 39.0 - 52.0 % 32.0  40.0  31.9   Platelets 150 - 400 K/uL 122  152  159        Latest Ref Rng & Units 06/14/2024    1:25 AM 06/13/2024    2:30 PM 11/25/2023    4:26 AM  CMP  Glucose 70 - 99 mg/dL 758  775  861   BUN 8 - 23 mg/dL 50  51  65   Creatinine 0.61 - 1.24 mg/dL 8.31  8.24  8.23   Sodium 135 - 145 mmol/L 140  139  141   Potassium  3.5 - 5.1 mmol/L 5.0  4.6  4.6   Chloride 98 - 111 mmol/L 108  106  115   CO2 22 - 32 mmol/L 24  23  18    Calcium  8.9 - 10.3 mg/dL 8.9  9.8  8.4   Total Protein 6.5 - 8.1 g/dL 6.0  7.5  6.3   Total Bilirubin 0.0 - 1.2 mg/dL 0.4  0.5  0.7   Alkaline Phos 38 - 126 U/L 66  81  49   AST 15 - 41 U/L 26  25  17    ALT 0 - 44 U/L 21  20  14      EEG adult Result Date: 06/13/2024 Shelton Arlin KIDD, MD     06/13/2024  5:56 PM Patient Name: Justin Salinas MRN: [id number] Epilepsy Attending: Arlin KIDD Shelton Referring Physician/Provider: Kandis Perkins, DO Date: 06/13/2024 Duration: 22.21 mins Patient history: 84yo M with ams. EG to evaluate for seizure Level of alertness: Awake AEDs during EEG study: None Technical aspects: This EEG study was done with scalp electrodes positioned according to the 10-20 International system of electrode placement. Electrical activity was reviewed with band pass filter of 1-70Hz , sensitivity of 7 uV/mm, display speed of 76mm/sec with a 60Hz  notched filter applied as appropriate. EEG data were  recorded continuously and digitally stored.  Video monitoring was available and reviewed as appropriate. Description: EEG showed continuous generalized 3 to 6 Hz theta-delta slowing. Hyperventilation and photic stimulation were not performed.   ABNORMALITY - Continuous slow, generalized IMPRESSION: This study is suggestive of generalized cerebral dysfunction (encephalopathy). No seizures or epileptiform discharges were seen throughout the recording. Arlin KIDD Shelton   DG Chest Port 1 View Result Date: 06/13/2024 CLINICAL DATA:  Sepsis, altered level of consciousness EXAM: PORTABLE CHEST 1 VIEW COMPARISON:  11/23/2023 FINDINGS: Single frontal view of the chest demonstrates postsurgical changes from CABG. Cardiac silhouette is unremarkable. No airspace disease, effusion, or pneumothorax. No acute bony abnormalities. IMPRESSION: 1. No acute intrathoracic process. Electronically Signed   By: Ozell Daring M.D.   On: 06/13/2024 15:21    ASSESSMENT/PLAN:  Assessment: Principal Problem:   Encephalopathy Active Problems:   Essential hypertension   CAD (coronary artery disease)   BPH (benign prostatic hyperplasia)   Type 2 diabetes mellitus with hyperglycemia (HCC)   Status post placement of bone anchored hearing aid (BAHA)   Chronic diastolic CHF (congestive heart failure) (HCC)   Dementia with behavioral disturbance (HCC)   Nystagmus   UTI (urinary tract infection)  Justin Salinas is a 84 y.o. male with PMH of CAD status post CABG x 4, controlled type 2 diabetes mellitus, essential hypertension, carotid artery disease, BPH, hypertension, lumbar radiculopathy, Cognitive decline, chronic diastolic heart failure, dementia that presents today for altered mental status starting this morning and admitted for encephalopathy with improving mental status this morning.   Plan: #encephalopathy, resolving Patient was and oriented to name, place, month and somewhat to year.  He is more interactive this  morning and is not as agitated.  Did still note the [employer] sustained nystagmus and cogwheel rigidity and some mild leadpipe rigidity in lower extremities.  Presentation also could have been due to UTI for which we are treating. -Continue ceftriaxone  day 2 -Follow urine culture -MRI showed chronic mesial temporal lobe atrophy, chronic small medium vessel ischemia that is most pronounced in the left cerebellum.  Could explain his nystagmus.  Nothing acute. - Will reach out to neuro with these findings to see if anything  reversible inpatient  - follow RPR, but low differential for neurosyphilis  - Pending B1  # Sepsis  #Leukocytosis Patient has leukocytosis of 15.3 this morning which was not present on admission.  Could be reactive, but reassured by improving mental status.  No other source of infection identified at this point. - Follow urine culture - Continue ceftriaxone  day 2  #Dementia Patient was on risperidone   . 5 mg every morning and 1 mg  - holding risperidone  and sertraline  100 mg daily  - delirium precautions in  #HTN  Holding all BP meds until we see MRI.  Patient is on clonidine  patch to prevent any rebound hypertension. -For now is to keep SBP below 220  #BPH  Continue finasteride  5 mg and tamsulosin  0.4 mg daily  #Type 2 diabetes mellitus  -Lantus  10 units daily at bedtime( increased from 8 mg)  - sensitive insulin  sliding scale   #CKD stage 3b Creatinine appears to be at baseline.    #CAD s/p CABG x4  #PVD  Continue aspirin  81 mg. No acute concerns for ischemic changes.  -Starting on Crestor  20 mg for history of chronic strokes   #chronic diastolic CHF  No acute concerns for exacerbation. EF 60 to 65%, asymmetric LVH of septal segment. Grade 1 diastolic dysfunction  Best Practice: Diet: pending bedside swallow  IVF: LR until cleared for PO intake  VTE: enoxaparin  (LOVENOX ) injection 40 mg Start: 06/13/24 1800 Code: DNR/DNI  Disposition  planning: Therapy Recs: pending Family Contact: daughter updated  DISPO: Anticipated discharge pending PT recs .  Signature:  [facility]  9:37 AM, 06/14/2024  On Call pager [phone]  "

## 2024-06-14 NOTE — Progress Notes (Addendum)
 Checked on evening rounds. Seems improved from admission. No nystagmus on exam, follows instructions and answers questions mostly appropriately but still disoriented with respect to place, thinks he's at home. We bladder scanned and saw decompressed bladder, thickened bladder walls, but no retention. No changes to management.  Ozell Kung MD 06/14/2024, 12:56 AM

## 2024-06-14 NOTE — Evaluation (Signed)
 Physical Therapy Evaluation Patient Details Name: Justin Salinas MRN: [id number] DOB: 1940-09-09 Today's Date: 06/14/2024  History of Present Illness  Pt os an 84 y.o. male presenting 1/10 from [facility] ALF with AMS. Admitted for encephalopathy, UTI, lactic acidosis. MRI brain negative for acute findings with chronic mesial temporal lobe atrophy and chronic small/medium vessel ischemia more pronounced in L cerebellum. PMH: CAD s/p CABG x4, DMII, HTN, BPH, lumbar radiculopathy, chronic diastolic heart failure, dementia.   Clinical Impression  Pt admitted from ALF where pt reports being able to take himself to the restroom with intermittent assist for other mobility. Pt presents with generalized weakness, impaired cognition, decreased safety awareness, and impaired balance/gait pattern. Pt likely close to baseline requiring CGA for bed mobility and MinA/CGA to stand and ambulate 72ft with RW. Assist needed for RW management with cues for safety throughout. Recommending return to ALF with HHPT. Acute PT to follow.       If plan is discharge home, recommend the following: A lot of help with walking and/or transfers;A lot of help with bathing/dressing/bathroom;Assistance with cooking/housework;Direct supervision/assist for medications management;Direct supervision/assist for financial management;Assist for transportation;Help with stairs or ramp for entrance   Can travel by private vehicle    Yes    Equipment Recommendations None recommended by PT     Functional Status Assessment Patient has had a recent decline in their functional status and demonstrates the ability to make significant improvements in function in a reasonable and predictable amount of time.     Precautions / Restrictions Precautions Precautions: Fall Recall of Precautions/Restrictions: Impaired Restrictions Weight Bearing Restrictions Per Provider Order: No      Mobility  Bed Mobility Overal bed mobility:  Needs Assistance Bed Mobility: Supine to Sit    Supine to sit: Contact guard   Transfers Overall transfer level: Needs assistance Equipment used: Rolling walker (2 wheels) Transfers: Sit to/from Stand Sit to Stand: Min assist    General transfer comment: cues for safety, light hands on cueing for initial rise    Ambulation/Gait Ambulation/Gait assistance: Contact guard assist, Min assist Gait Distance (Feet): 30 Feet Assistive device: Rolling walker (2 wheels) Gait Pattern/deviations: Step-through pattern, Decreased stride length Gait velocity: decr    General Gait Details: CGA for majority of gait with MinA for slight RW mangement and unsteadiness when crossing bathroom threshold. Cueing for safely when navigating around obstacles, RW proximity, and to keep hands on RW handles    Balance Overall balance assessment: Needs assistance Sitting-balance support: No upper extremity supported, Feet supported Sitting balance-Leahy Scale: Good     Standing balance support: Bilateral upper extremity supported, During functional activity, No upper extremity supported Standing balance-Leahy Scale: Poor Standing balance comment: benefits from UE support dynamically       Pertinent Vitals/Pain Pain Assessment Pain Assessment: No/denies pain    Home Living Family/patient expects to be discharged to:: Assisted living [facility])    Home Equipment: [place] - single point;Shower seat;Hand held shower head;Grab bars - toilet;Grab bars - tub/shower;Rolling Walker (2 wheels) Additional Comments: has assistance for all IADL's; pt reports normally able to take himself to restroom and this is consistent with chart    Prior Function Prior Level of Function : Patient poor historian/Family not available      Mobility Comments: pt reports he uses a walker; unsure accuracy of report ADLs Comments: pt reports he is able to take self to restroom; inconsistent report with regard to other ADL      Extremity/Trunk Assessment  Upper Extremity Assessment Upper Extremity Assessment: Defer to OT evaluation    Lower Extremity Assessment Lower Extremity Assessment: Generalized weakness       Communication   Communication Communication: Impaired Factors Affecting Communication: Hearing impaired    Cognition Arousal: Alert Behavior During Therapy: WFL for tasks assessed/performed   PT - Cognitive impairments: History of cognitive impairments, Orientation, Awareness, Memory, Attention, Sequencing, Problem solving, Safety/Judgement   Orientation impairments: Place, Time, Situation    Following commands: Impaired Following commands impaired: Follows one step commands with increased time, Follows multi-step commands inconsistently (repeated cues for safety during mobility after initial education provided. Follows one step commands consistently when he can hear them)     Cueing Cueing Techniques: Verbal cues, Gestural cues     General Comments General comments (skin integrity, edema, etc.): VSS on RA     PT Assessment Patient needs continued PT services  PT Problem List Decreased strength;Decreased activity tolerance;Decreased balance;Decreased mobility;Decreased cognition;Decreased knowledge of use of DME;Decreased safety awareness       PT Treatment Interventions DME instruction;Gait training;Stair training;Therapeutic activities;Functional mobility training;Therapeutic exercise;Balance training;Neuromuscular re-education;Patient/family education    PT Goals (Current goals can be found in the Care Plan section)  Acute Rehab PT Goals Patient Stated Goal: to go home PT Goal Formulation: With patient Time For Goal Achievement: 06/28/24 Potential to Achieve Goals: Good    Frequency Min 1X/week        AM-PAC PT 6 Clicks Mobility  Outcome Measure Help needed turning from your back to your side while in a flat bed without using bedrails?: A Little Help needed  moving from lying on your back to sitting on the side of a flat bed without using bedrails?: A Little Help needed moving to and from a bed to a chair (including a wheelchair)?: A Little Help needed standing up from a chair using your arms (e.g., wheelchair or bedside chair)?: A Little Help needed to walk in hospital room?: A Little Help needed climbing 3-5 steps with a railing? : A Little 6 Click Score: 18    End of Session Equipment Utilized During Treatment: Gait belt Activity Tolerance: Patient tolerated treatment well Patient left: in chair;with call bell/phone within reach;with chair alarm set Nurse Communication: Mobility status PT Visit Diagnosis: Unsteadiness on feet (R26.81);Other abnormalities of gait and mobility (R26.89);Muscle weakness (generalized) (M62.81)    Time: 8886-8870 PT Time Calculation (min) (ACUTE ONLY): 16 min   Charges:   PT Evaluation $PT Eval Low Complexity: 1 Low   PT General Charges $$ ACUTE PT VISIT: 1 Visit        Kate ORN, PT, DPT Secure Chat Preferred  Rehab Office [phone]   Kate BRAVO Wendolyn 06/14/2024, 12:38 PM

## 2024-06-14 NOTE — Evaluation (Signed)
 Occupational Therapy Evaluation Patient Details Name: Justin Salinas MRN: [id number] DOB: [birth year] Today's Date: 06/14/2024   History of Present Illness   Pt os an 84 y.o. male presenting 1/10 from [facility] ALF with AMS. Admitted for encephalopathy, UTI, lactic acidosis. MRI brain negative for acute findings with chronic mesial temporal lobe atrophy and chronic small/medium vessel ischemia more pronounced in L cerebellum. PMH: CAD s/p CABG x4, DMII, HTN, BPH, lumbar radiculopathy, chronic diastolic heart failure, dementia.     Clinical Impressions PTA, pt living at [facility] ALF and per chart, typically able to take himself to the restroom, but receives assist with other BADL, and intermittent assist with mobility. Upon eval, pt likely close to baseline, but with generalized weakness noted and continued urinary incontinence (catheter currently catching urine, and pt stating I need to go to the bathroom). Pt grossly needing CGA for OOB mobility with RW and intermittent min A secondary to poor safety with use and placement of RW during ADL and transitions from one surface to the next. Will follow acutely, but do not suspect need for follow up OT after discharge.      If plan is discharge home, recommend the following:   A little help with walking and/or transfers;A little help with bathing/dressing/bathroom;Assistance with cooking/housework;Assist for transportation;Help with stairs or ramp for entrance     Functional Status Assessment   Patient has had a recent decline in their functional status and demonstrates the ability to make significant improvements in function in a reasonable and predictable amount of time.     Equipment Recommendations   None recommended by OT     Recommendations for Other Services         Precautions/Restrictions   Precautions Precautions: Fall Restrictions Weight Bearing Restrictions Per Provider Order: No     Mobility Bed  Mobility Overal bed mobility: Needs Assistance Bed Mobility: Supine to Sit     Supine to sit: Contact guard          Transfers Overall transfer level: Needs assistance Equipment used: Rolling walker (2 wheels) Transfers: Sit to/from Stand Sit to Stand: Min assist           General transfer comment: cues for safety, light hands on cueing for initial rise      Balance Overall balance assessment: Needs assistance Sitting-balance support: No upper extremity supported, Feet supported Sitting balance-Leahy Scale: Good     Standing balance support: Bilateral upper extremity supported, During functional activity, No upper extremity supported Standing balance-Leahy Scale: Poor Standing balance comment: benefits from UE support dynamically                           ADL either performed or assessed with clinical judgement   ADL Overall ADL's : Needs assistance/impaired Eating/Feeding: Set up;Sitting   Grooming: Contact guard assist;Standing;Cueing for sequencing   Upper Body Bathing: Set up;Sitting   Lower Body Bathing: Minimal assistance;Sit to/from stand   Upper Body Dressing : Set up;Sitting   Lower Body Dressing: Minimal assistance;Sit to/from stand   Toilet Transfer: Minimal assistance;Contact guard assist;Cueing for safety;Rolling walker (2 wheels);Ambulation           Functional mobility during ADLs: Contact guard assist;Minimal assistance;Rolling walker (2 wheels);Cueing for safety       Vision Baseline Vision/History: 1 Wears glasses Ability to See in Adequate Light: 0 Adequate Patient Visual Report: No change from baseline Vision Assessment?: Vision impaired- to be further tested in functional context  Additional Comments: difficulty tracking in all quads with additional eye shifts, suspect more due to difficulty following commands than visual impairment but needs further assessment. pt was unable to identify whether he has L eye visual  deficits at baseline as OT found in one of his MD notes     Perception Perception: Not tested       Praxis Praxis: Not tested       Pertinent Vitals/Pain Pain Assessment Pain Assessment: No/denies pain     Extremity/Trunk Assessment Upper Extremity Assessment Upper Extremity Assessment: Generalized weakness   Lower Extremity Assessment Lower Extremity Assessment: Defer to PT evaluation       Communication Communication Communication: Impaired Factors Affecting Communication: Hearing impaired   Cognition Arousal: Alert Behavior During Therapy: WFL for tasks assessed/performed Cognition: History of cognitive impairments             OT - Cognition Comments: dementia at baseline. oriented to self only. after being re-oriented to being in [place], does demo recall with increased time                 Following commands: Impaired Following commands impaired: Follows one step commands with increased time, Follows multi-step commands inconsistently (repeated cues for safety during mobility after initial education provided. Follows one step commands consistently when he can hear them)     Cueing  General Comments   Cueing Techniques: Verbal cues;Gestural cues      Exercises     Shoulder Instructions      Home Living Family/patient expects to be discharged to:: Assisted living [facility]                             Home Equipment: [place] - single point;Shower seat;Hand held shower head;Grab bars - toilet;Grab bars - tub/shower;Rolling Walker (2 wheels)   Additional Comments: has assistance for all IADL's; pt reports normally able to take himself to restroom and this is consistent with chart      Prior Functioning/Environment Prior Level of Function : Patient poor historian/Family not available             Mobility Comments: pt reports he uses a walker; unsure accuracy of report ADLs Comments: pt reports he is able to take self  to restroom; inconsistent report with regard to other ADL    OT Problem List: Decreased strength;Decreased activity tolerance;Impaired balance (sitting and/or standing);Decreased cognition;Decreased knowledge of use of DME or AE;Decreased safety awareness   OT Treatment/Interventions: Self-care/ADL training;Therapeutic exercise;DME and/or AE instruction;Therapeutic activities;Balance training;Patient/family education      OT Goals(Current goals can be found in the care plan section)   Acute Rehab OT Goals OT Goal Formulation: With patient Time For Goal Achievement: 06/28/24 Potential to Achieve Goals: Good   OT Frequency:  Min 1X/week    Co-evaluation              AM-PAC OT 6 Clicks Daily Activity     Outcome Measure Help from another person eating meals?: A Little Help from another person taking care of personal grooming?: A Little Help from another person toileting, which includes using toliet, bedpan, or urinal?: A Little Help from another person bathing (including washing, rinsing, drying)?: A Little Help from another person to put on and taking off regular upper body clothing?: A Little Help from another person to put on and taking off regular lower body clothing?: A Little 6 Click Score: 18   End of Session Equipment  Utilized During Treatment: Gait belt;Rolling walker (2 wheels) Nurse Communication: Mobility status  Activity Tolerance: Patient tolerated treatment well Patient left: in chair;with call bell/phone within reach;with chair alarm set  OT Visit Diagnosis: Unsteadiness on feet (R26.81);Muscle weakness (generalized) (M62.81);Other symptoms and signs involving cognitive function                Time: 8888-8870 OT Time Calculation (min): 18 min Charges:  OT General Charges $OT Visit: 1 Visit OT Evaluation $OT Eval Low Complexity: 1 Low  Elma JONETTA Lebron FREDERICK, OTR/L [facility] Acute Rehabilitation Office: [phone]   Elma JONETTA Lebron 06/14/2024, 11:59  AM

## 2024-06-14 NOTE — Plan of Care (Signed)
" °  Problem: Clinical Measurements: Goal: Respiratory complications will improve Outcome: Progressing   Problem: Education: Goal: Knowledge of General Education information will improve Description: Including pain rating scale, medication(s)/side effects and non-pharmacologic comfort measures Outcome: Not Progressing   Problem: Health Behavior/Discharge Planning: Goal: Ability to manage health-related needs will improve Outcome: Not Progressing   Problem: Clinical Measurements: Goal: Will remain free from infection Outcome: Not Progressing   Problem: Nutrition: Goal: Adequate nutrition will be maintained Outcome: Not Progressing   "

## 2024-06-15 DIAGNOSIS — I130 Hypertensive heart and chronic kidney disease with heart failure and stage 1 through stage 4 chronic kidney disease, or unspecified chronic kidney disease: Secondary | ICD-10-CM | POA: Diagnosis not present

## 2024-06-15 DIAGNOSIS — I2510 Atherosclerotic heart disease of native coronary artery without angina pectoris: Secondary | ICD-10-CM

## 2024-06-15 DIAGNOSIS — N400 Enlarged prostate without lower urinary tract symptoms: Secondary | ICD-10-CM | POA: Diagnosis not present

## 2024-06-15 DIAGNOSIS — E1151 Type 2 diabetes mellitus with diabetic peripheral angiopathy without gangrene: Secondary | ICD-10-CM

## 2024-06-15 DIAGNOSIS — G9340 Encephalopathy, unspecified: Secondary | ICD-10-CM | POA: Diagnosis not present

## 2024-06-15 DIAGNOSIS — F0390 Unspecified dementia without behavioral disturbance: Secondary | ICD-10-CM | POA: Diagnosis not present

## 2024-06-15 DIAGNOSIS — I5032 Chronic diastolic (congestive) heart failure: Secondary | ICD-10-CM

## 2024-06-15 DIAGNOSIS — A419 Sepsis, unspecified organism: Secondary | ICD-10-CM

## 2024-06-15 DIAGNOSIS — Z794 Long term (current) use of insulin: Secondary | ICD-10-CM | POA: Diagnosis not present

## 2024-06-15 DIAGNOSIS — N1832 Chronic kidney disease, stage 3b: Secondary | ICD-10-CM

## 2024-06-15 DIAGNOSIS — D72829 Elevated white blood cell count, unspecified: Secondary | ICD-10-CM

## 2024-06-15 DIAGNOSIS — E1122 Type 2 diabetes mellitus with diabetic chronic kidney disease: Secondary | ICD-10-CM

## 2024-06-15 LAB — CBC
HCT: 36.6 % — ABNORMAL LOW (ref 39.0–52.0)
Hemoglobin: 11.9 g/dL — ABNORMAL LOW (ref 13.0–17.0)
MCH: 32.2 pg (ref 26.0–34.0)
MCHC: 32.5 g/dL (ref 30.0–36.0)
MCV: 99.2 fL (ref 80.0–100.0)
Platelets: 149 K/uL — ABNORMAL LOW (ref 150–400)
RBC: 3.69 MIL/uL — ABNORMAL LOW (ref 4.22–5.81)
RDW: 13.3 % (ref 11.5–15.5)
WBC: 9.9 K/uL (ref 4.0–10.5)
nRBC: 0 % (ref 0.0–0.2)

## 2024-06-15 LAB — BASIC METABOLIC PANEL WITH GFR
Anion gap: 8 (ref 5–15)
BUN: 44 mg/dL — ABNORMAL HIGH (ref 8–23)
CO2: 25 mmol/L (ref 22–32)
Calcium: 9.2 mg/dL (ref 8.9–10.3)
Chloride: 108 mmol/L (ref 98–111)
Creatinine, Ser: 1.55 mg/dL — ABNORMAL HIGH (ref 0.61–1.24)
GFR, Estimated: 44 mL/min — ABNORMAL LOW
Glucose, Bld: 88 mg/dL (ref 70–99)
Potassium: 4.2 mmol/L (ref 3.5–5.1)
Sodium: 142 mmol/L (ref 135–145)

## 2024-06-15 LAB — URINE CULTURE: Culture: >=100000 — AB

## 2024-06-15 LAB — GLUCOSE, CAPILLARY
Glucose-Capillary: 101 mg/dL — ABNORMAL HIGH (ref 70–99)
Glucose-Capillary: 313 mg/dL — ABNORMAL HIGH (ref 70–99)
Glucose-Capillary: 156 mg/dL — ABNORMAL HIGH (ref 70–99)

## 2024-06-15 MED ORDER — ROSUVASTATIN CALCIUM 20 MG PO TABS: 20.0000 mg | ORAL_TABLET | Freq: Every day | ORAL | Status: AC

## 2024-06-15 MED ORDER — SPIRONOLACTONE 25 MG PO TABS
25.0000 mg | ORAL_TABLET | Freq: Every day | ORAL | Status: DC
  Administered 2024-06-15: 25 mg via ORAL
  Filled 2024-06-15: qty 1

## 2024-06-15 MED ORDER — QUETIAPINE FUMARATE 25 MG PO TABS: 25.0000 mg | ORAL_TABLET | Freq: Every day | ORAL | Status: AC

## 2024-06-15 MED ORDER — NITROFURANTOIN MONOHYD MACRO 100 MG PO CAPS: 100.0000 mg | ORAL_CAPSULE | Freq: Two times a day (BID) | ORAL | Status: DC

## 2024-06-15 MED ORDER — CARVEDILOL 12.5 MG PO TABS
25.0000 mg | ORAL_TABLET | Freq: Two times a day (BID) | ORAL | Status: DC
  Administered 2024-06-15 (×2): 25 mg via ORAL
  Filled 2024-06-15 (×2): qty 2

## 2024-06-15 NOTE — Progress Notes (Incomplete)
 "  HD#2 SUBJECTIVE:  Patient Summary: Justin Salinas is a 84 y.o. with a pertinent PMH of CAD status post CABG x4, controlled T2DM, Essential HTN, CAD, BPH, lumbar radiculopathy, cognitive decline, chronic diastolic HF, dementia, who presented with encephalopathy and admitted for encephalopathy workup.   Overnight Events: Ativan  given o/n for agitation.   Interim History: afebrile, BP 167/83, HR 73, 95% on RA  OBJECTIVE:  Vital Signs: Vitals:   06/14/24 1608 06/14/24 1954 06/14/24 2345 06/15/24 0334  BP: (!) 159/63 131/84 (!) 173/65 (!) 167/83  Pulse: 70 70 62 73  Resp: 18 18 16 17   Temp: (!) 97.5 F (36.4 C) 97.7 F (36.5 C) 97.7 F (36.5 C) 98.1 F (36.7 C)  TempSrc: Oral Oral Oral Oral  SpO2: 99% 94% 97% 95%   Supplemental O2: Room Air SpO2: 95 % O2 Flow Rate (L/min): 2 L/min  There were no vitals filed for this visit.   Intake/Output Summary (Last 24 hours) at 06/15/2024 0714 Last data filed at 06/15/2024 0600 Gross per 24 hour  Intake --  Output 650 ml  Net -650 ml   Net IO Since Admission: 652.94 mL [06/15/24 0714]  Physical Exam: Physical Exam  Patient Lines/Drains/Airways Status     Active Line/Drains/Airways     Name Placement date Placement time Site Days   Peripheral IV 06/13/24 18 G Anterior;Right;Upper Arm 06/13/24  1430  Arm  2   Peripheral IV 06/13/24 20 G Anterior;Distal;Left Forearm 06/13/24  1500  Forearm  2   External Urinary Catheter 06/14/24  2200  --  1            Pertinent labs and imaging:  Leukocytosis resolved.  Urine Culture positive for Ecoli  Blood cultures negative.  RPR nonreactive.     Latest Ref Rng & Units 06/15/2024    2:30 AM 06/14/2024    1:25 AM 06/13/2024    2:30 PM  CBC  WBC 4.0 - 10.5 K/uL 9.9  15.3  10.0   Hemoglobin 13.0 - 17.0 g/dL 88.0  89.2  87.0   Hematocrit 39.0 - 52.0 % 36.6  32.0  40.0   Platelets 150 - 400 K/uL 149  122  152        Latest Ref Rng & Units 06/15/2024    2:30 AM 06/14/2024     1:25 AM 06/13/2024    2:30 PM  CMP  Glucose 70 - 99 mg/dL 88  758  775   BUN 8 - 23 mg/dL 44  50  51   Creatinine 0.61 - 1.24 mg/dL 8.44  8.31  8.24   Sodium 135 - 145 mmol/L 142  140  139   Potassium 3.5 - 5.1 mmol/L 4.2  5.0  4.6   Chloride 98 - 111 mmol/L 108  108  106   CO2 22 - 32 mmol/L 25  24  23    Calcium  8.9 - 10.3 mg/dL 9.2  8.9  9.8   Total Protein 6.5 - 8.1 g/dL  6.0  7.5   Total Bilirubin 0.0 - 1.2 mg/dL  0.4  0.5   Alkaline Phos 38 - 126 U/L  66  81   AST 15 - 41 U/L  26  25   ALT 0 - 44 U/L  21  20     MR BRAIN WO CONTRAST Result Date: 06/14/2024 EXAM: MRI BRAIN WITHOUT CONTRAST 06/14/2024 10:12:36 AM TECHNIQUE: Multiplanar multisequence MRI of the head/brain was performed without the administration of intravenous contrast. COMPARISON: Brain  MRI 07/16/2022. CLINICAL HISTORY: 84 year old male. Altered mental status. Neurological deficit, acute, stroke suspected. FINDINGS: BRAIN AND VENTRICLES: No acute infarct. No intracranial hemorrhage. No mass. No midline shift. The sella is unremarkable. Normal flow voids. Chronic disproportionate and asymmetric mesial temporal lobe atrophy greater on the right (series 9 image 19), overall brain volume does not appear significantly changed since 2024. Nonspecific chronic ventricular enlargement is stable. There is a small chronic focus of left inferior temporal lobe cortical encephalomalacia which is stable. Minimal chronic cerebral blood products on SWI, stable. Patchy chronic infarcts in the left cerebellum also appear unchanged. Negative visible internal auditory structures. ORBITS: No significant abnormality. SINUSES AND MASTOIDS: Chronic left maxillary sinus disease, other paranasal sinuses and mastoids remain well aerated. BONES AND SOFT TISSUES: Normal marrow signal. No significant soft tissue abnormality. Negative for age visible cervical spine. IMPRESSION: 1. No acute intracranial abnormality. 2. Chronic mesial temporal lobe atrophy,  chronic small and medium-sized vessel ischemia, most pronounced in the left cerebellum. Electronically signed by: Helayne Hurst MD MD 06/14/2024 10:32 AM EST RP Workstation: [id number]    ASSESSMENT/PLAN:  Assessment: Principal Problem:   Encephalopathy Active Problems:   Essential hypertension   CAD (coronary artery disease)   BPH (benign prostatic hyperplasia)   Type 2 diabetes mellitus with hyperglycemia (HCC)   Status post placement of bone anchored hearing aid (BAHA)   Chronic diastolic CHF (congestive heart failure) (HCC)   Dementia with behavioral disturbance (HCC)   Nystagmus   UTI (urinary tract infection)   Plan: #Encephalopathy, resolving  #Sepsis, resolved.  #Leukocytosis, resolved.  Encephalopathy likely 2/2 UTI. Urine culture positive for Ecoli. Baseline: oriented to name, place, month, and somewhat year. Patient was noted to have sustained nystagmus and cogwheel rigidity and some lead pipe rigidity in LE. MRI showing chronic mesial temporal lobe atrophy, chronic small medium vessel ischemia that is most pronounced in left cerebellum which could be contributing to nystagmus. No acute changes. RPR nonreactive. Leukocytosis resolved.  - continue ceftriaxone  day 3 - RPR pending - B1 pending   #Dementia  Patient was on risperidone  at home, 0.5 mg every morning and 1.0 mg nightly. Additionally on sertraline  100 mg daily. Holding Risperidone . Received ativan  last night for agitation.  - continue sertraline  100 mg  - delirium precautions in  - Risperidone ? Vs seroquel    #HTN Blood Pressures with lowest SBP 131 and highest 173. DBP lowest 63 and highest 84. Home BP meds include: Amlodipine  10 mg, coreg  25 mg BID, spironolactone  25 mg, and clonidine  patch 0.1 mg transdermal every Friday.  - continue amlodipine  10 mg daily  - coreg  25 mg BID ordered - spironolactone  25 mg daily ordered  - clonidine  patch   #Constipation  Continue miralax  and senna daily   Chronic Medical  Conditions  #Type 2 diabetes mellitus  Glucose 101.  - Lantus  10 units daily at bedtime (increased from 8 mg)  - sensitive insulin  sliding scale   #CAD s/p CABG x4  #PVD  Continue aspirin  81 mg. No acute concerns for ischemic changes.  -Starting on Crestor  20 mg for history of chronic strokes  #BPH Continue finasteride  5 mg and tamsulosin  0.4 mg daily   #Chronic Normocytic anemia  #Thrombocytopenia  #Hx iron deficiency Anemia  Stable. MCV 99.2, Hbg 11.9, HCT 36.6, RDW 13.3. Platelets 149. Last iron panel 03/12/2023, unremarkable.     #CKD stage 3b Creatinine appears to be at baseline.    #Chronic diastolic CHF  No acute concerns for exacerbation. EF 60 to  65%, asymmetric LVH of septal segment. Grade 1 diastolic dysfunction   Best Practice: Diet: pending bedside swallow  IVF: LR until cleared for PO intake  VTE: enoxaparin  (LOVENOX ) injection 40 mg Start: 06/13/24 1800 Code: DNR/DNI   Disposition planning: Therapy Recs: pending Family Contact: daughter updated  DISPO: Anticipated discharge pending PT recs .  Signature:  Sallyanne Benuel [facility]  7:14 AM, 06/15/2024  On Call pager [phone]  "

## 2024-06-15 NOTE — Progress Notes (Signed)
 Patient is for discharge to [facility] today.  IV, tele and primofit discontinued. AVS with discharge package.  Patient is alert to self only and he cognitively at his baseline.  Patient is comfortale, denies any discomfort.  Ambulates to bathroom with walker. Awaiting tranport from daughter to assisted living.

## 2024-06-15 NOTE — Discharge Instructions (Addendum)
 Thank you for allowing us  to be part of your care. You were hospitalized for altered mental status likely due to UTI. We treated you with IV antibiotics and switched you to oral antibiotics.   See the changes in your medications and management of your chronic conditions below:  *For your UTI -We have STARTED you on these following medications:  - Start Macrobid  100 mg every 12 hours on 1/13 with last dose occurring 1/14 (3 days of antibiotics received in hospital, a total of 5 day course)  *For your Dementia -We have STARTED you on these following medications:  - Seroquel  25 mg daily at bedtime   -We have STOPPED the following medications:  - Risperidone    *For your high cholesterol  - We have STARTED you on Rosuvastatin  20 mg daily    Thank you for allowing us  to be part of your care.  We are glad you are feeling better,  Sallyanne Primas Internal Medicine Inpatient Teaching Service at [facility]

## 2024-06-15 NOTE — NC FL2 (Signed)
 " [place]  MEDICAID FL2 LEVEL OF CARE FORM     IDENTIFICATION  Patient Name: Justin Salinas Birthdate: 1940/09/09 Sex: male Admission Date (Current Location): 06/13/2024  [facility] and [place] Number:  Producer, Television/film/video and Address:  [facility] [place], [address] [place], [place] [address]      Provider Number: [id number]  Attending Physician Name and Address:  Eben Reyes BROCKS, MD  Relative Name and Phone Number:  [facility]  Daughter, Emergency Contact  [phone] (Mobile)    Current Level of Care: Hospital Recommended Level of Care: Assisted Living Facility Prior Approval Number:    Date Approved/Denied:   PASRR Number:    Discharge Plan: Other (Comment) (ALF)    Current Diagnoses: Patient Active Problem List   Diagnosis Date Noted   Encephalopathy 06/13/2024   Nystagmus 06/13/2024   UTI (urinary tract infection) 06/13/2024   Acute kidney injury superimposed on stage 4 chronic kidney disease (HCC) 11/23/2023   Chronic diastolic CHF (congestive heart failure) (HCC) 11/23/2023   Macrocytic anemia 11/23/2023   Dementia with behavioral disturbance (HCC) 11/23/2023   AKI (acute kidney injury) 09/25/2023   Hyperglycemia 07/24/2022   Recurrent falls 07/23/2022   Cognitive decline 07/23/2022   Lumbar radiculopathy 09/23/2020   Atherosclerosis of abdominal aorta 02/26/2020   Status post placement of bone anchored hearing aid (BAHA) 05/09/2019   Asymmetric SNHL (sensorineural hearing loss) 04/02/2019   Iron deficiency anemia 10/22/2017   Type 2 diabetes mellitus with hyperglycemia (HCC) 01/03/2017   PVD (posterior vitreous detachment), left eye 02/16/2016   Vitreous syneresis of right eye 02/16/2016   Diastolic dysfunction    Hypertension associated with diabetes (HCC)    CKD stage 3 due to type 2 diabetes mellitus (HCC)    S/P CABG x 4 05/21/2014   Bilateral dry eyes 01/20/2014   Nuclear cataract, bilateral 12/10/2013   Astigmatism  01/11/2012   Hypermetropia 01/11/2012   Presbyopia 01/11/2012   Essential hypertension 12/01/2007   Carotid artery disease 12/01/2007   BPH (benign prostatic hyperplasia) 12/01/2007   Controlled type 2 diabetes mellitus, with long-term current use of insulin  (HCC) 11/28/2007   CAD (coronary artery disease) 11/28/2007   GERD 11/28/2007    Orientation RESPIRATION BLADDER Height & Weight     Self  Normal Incontinent Weight:   Height:     BEHAVIORAL SYMPTOMS/MOOD NEUROLOGICAL BOWEL NUTRITION STATUS      Continent Diet (Regular diet)  AMBULATORY STATUS COMMUNICATION OF NEEDS Skin   Limited Assist Verbally Normal                       Personal Care Assistance Level of Assistance  Bathing, Feeding, Dressing Bathing Assistance: Limited assistance Feeding assistance: Independent Dressing Assistance: Limited assistance     Functional Limitations Info  Sight, Hearing, Speech Sight Info: Adequate Hearing Info: Impaired Speech Info: Adequate    SPECIAL CARE FACTORS FREQUENCY  OT (By licensed OT), PT (By licensed PT)     PT Frequency: 2-3x/week OT Frequency: 2-3x/week            Contractures      Additional Factors Info  Code Status, Allergies Code Status Info: DNR-limited              Discharge Medications: STOP taking these medications     risperiDONE  0.5 MG tablet Commonly known as: RISPERDAL     risperiDONE  1 MG tablet Commonly known as: RISPERDAL   TAKE these medications     acetaminophen  325 MG tablet Commonly known as: TYLENOL  Take 650 mg by mouth every 6 (six) hours as needed for moderate pain (pain score 4-6).    alfuzosin  10 MG 24 hr tablet Commonly known as: UROXATRAL  Take 10 mg by mouth daily with breakfast.    amLODipine  10 MG tablet Commonly known as: NORVASC  TAKE 1 TABLET EVERY DAY    antiseptic oral rinse Liqd 15 mLs by Mouth Rinse route 4 (four) times daily as needed for dry mouth.    aspirin  81 MG tablet Take 81  mg by mouth every morning.    carvedilol  25 MG tablet Commonly known as: COREG  Take 25 mg by mouth 2 (two) times daily.    Centrum Silver  50+Men Tabs Take 1 tablet by mouth daily.    cloNIDine  0.1 mg/24hr patch Commonly known as: CATAPRES  - Dosed in mg/24 hr Place 0.1 mg onto the skin every Friday.    DEX4 POUCH PACK PO Take 15 g by mouth daily as needed (hypoglycemia).    finasteride  5 MG tablet Commonly known as: PROSCAR  Take 5 mg by mouth daily.    fluticasone 50 MCG/ACT nasal spray Commonly known as: FLONASE Place 1 spray into both nostrils daily.    HumaLOG KwikPen 100 UNIT/ML KwikPen Generic drug: insulin  lispro Inject 0-10 Units into the skin See admin instructions. Take 3 times daily per sliding scale: If BS<70 Call MD,  IF BS is 71-150=0 units,  151-200=2 units,  201-250=4 units,  251-300=6 units,  301-350=8 units,  351-400=10 units,  BS>400 Call MD    Lantus  SoloStar 100 UNIT/ML Solostar Pen Generic drug: insulin  glargine Inject 8 Units into the skin at bedtime.    loratadine 10 MG tablet Commonly known as: CLARITIN Take 10 mg by mouth daily.    nitrofurantoin  (macrocrystal-monohydrate) 100 MG capsule Commonly known as: MACROBID  Take 1 capsule (100 mg total) by mouth every 12 (twelve) hours. Start taking on: June 16, 2024    polyethylene glycol 17 g packet Commonly known as: MIRALAX  / GLYCOLAX  Take 17 g by mouth daily as needed.    QUEtiapine  25 MG tablet Commonly known as: SEROquel  Take 1 tablet (25 mg total) by mouth at bedtime.    repaglinide  0.5 MG tablet Commonly known as: PRANDIN  Take 0.5 mg by mouth 3 (three) times daily before meals.    rosuvastatin  20 MG tablet Commonly known as: CRESTOR  Take 1 tablet (20 mg total) by mouth daily. Start taking on: June 16, 2024    senna 8.6 MG Tabs tablet Commonly known as: SENOKOT Take 1 tablet by mouth every evening.    sertraline  100 MG tablet Commonly known as: ZOLOFT  Take 100 mg by  mouth daily.    spironolactone  25 MG tablet Commonly known as: ALDACTONE  Take 25 mg by mouth daily.    vitamin C 1000 MG tablet Take 1,000 mg by mouth daily.    Vitamin D3 Super Strength 50 MCG (2000 UT) tablet Generic drug: Cholecalciferol Take 1 tablet by mouth daily.      Relevant Imaging Results:  Relevant Lab Results:   Additional Information    Naiara Lombardozzi [employer], LCSW     "

## 2024-06-15 NOTE — TOC Transition Note (Signed)
 Transition of Care [facility]) - Discharge Note   Patient Details  Name: Justin Salinas MRN: [id number] Date of Birth: Mar 17, 1941  [facility] CM/SW Contact:  Luann SHAUNNA Cumming, LCSW Phone Number: 06/15/2024, 1:44 PM   Clinical Narrative:     Pt Dcing to [facility] today; HH PT/OT recommended. CSW updated ALF and confirmed they will arrange therapies. HH orders placed. DC summary and fl2 send to facility in hub. Daughter will pick pt up later this afternoon after work. RN to call report to [phone].   Final next level of care: Assisted living Barriers to Discharge: No Barriers Identified      Discharge Placement                Patient to be transferred to facility by: Daughter Name of family member notified: Daughter Patient and family notified of of transfer: 06/15/24          Social Drivers of Health (SDOH) Interventions SDOH Screenings   Food Insecurity: No Food Insecurity (11/24/2023)  Housing: Unknown (11/24/2023)  Transportation Needs: Patient Declined (11/24/2023)  Utilities: Patient Declined (11/24/2023)  Depression (PHQ2-9): Low Risk (12/11/2022)  Financial Resource Strain: Low Risk (12/11/2022)  Physical Activity: Insufficiently Active (12/11/2022)  Social Connections: Moderately Integrated (11/24/2023)  Recent Concern: Social Connections - Moderately Isolated (09/25/2023)  Stress: No Stress Concern Present (12/11/2022)  Tobacco Use: High Risk (11/23/2023)     Readmission Risk Interventions     No data to display

## 2024-06-15 NOTE — Discharge Summary (Signed)
 "  Name: Justin Salinas MRN: [id number] DOB: August 10, 1940 84 y.o. PCP: Kennyth Worth HERO, MD  Date of Admission: 06/13/2024  2:08 PM Date of Discharge: 06/15/2024 Attending Physician: Dr. Reyes Fenton  Discharge Diagnosis: 1. Principal Problem:   Encephalopathy Active Problems:   Essential hypertension   CAD (coronary artery disease)   BPH (benign prostatic hyperplasia)   Type 2 diabetes mellitus with hyperglycemia (HCC)   Status post placement of bone anchored hearing aid (BAHA)   Chronic diastolic CHF (congestive heart failure) (HCC)   Dementia with behavioral disturbance (HCC)   Nystagmus   UTI (urinary tract infection)   Discharge Medications: Allergies as of 06/15/2024       Reactions   Exenatide Other (See Comments)   Exenatide, sold under the brand name Byetta among others, is a medication used to treat type 2 diabetes. Unknown reaction   Linagliptin  Other (See Comments)   Tradjenta  (linagliptin ) is a brand-name tablet prescribed for type 2 diabetes. Unknown reaction   Lasix  [furosemide ] Other (See Comments)   Constipation        Medication List     STOP taking these medications    risperiDONE  0.5 MG tablet Commonly known as: RISPERDAL    risperiDONE  1 MG tablet Commonly known as: RISPERDAL        TAKE these medications    acetaminophen  325 MG tablet Commonly known as: TYLENOL  Take 650 mg by mouth every 6 (six) hours as needed for moderate pain (pain score 4-6).   alfuzosin  10 MG 24 hr tablet Commonly known as: UROXATRAL  Take 10 mg by mouth daily with breakfast.   amLODipine  10 MG tablet Commonly known as: NORVASC  TAKE 1 TABLET EVERY DAY   antiseptic oral rinse Liqd 15 mLs by Mouth Rinse route 4 (four) times daily as needed for dry mouth.   aspirin  81 MG tablet Take 81 mg by mouth every morning.   carvedilol  25 MG tablet Commonly known as: COREG  Take 25 mg by mouth 2 (two) times daily.   Centrum Silver  50+Men Tabs Take 1 tablet by  mouth daily.   cloNIDine  0.1 mg/24hr patch Commonly known as: CATAPRES  - Dosed in mg/24 hr Place 0.1 mg onto the skin every Friday.   DEX4 POUCH PACK PO Take 15 g by mouth daily as needed (hypoglycemia).   finasteride  5 MG tablet Commonly known as: PROSCAR  Take 5 mg by mouth daily.   fluticasone 50 MCG/ACT nasal spray Commonly known as: FLONASE Place 1 spray into both nostrils daily.   HumaLOG KwikPen 100 UNIT/ML KwikPen Generic drug: insulin  lispro Inject 0-10 Units into the skin See admin instructions. Take 3 times daily per sliding scale: If BS<70 Call MD,  IF BS is 71-150=0 units,  151-200=2 units,  201-250=4 units,  251-300=6 units,  301-350=8 units,  351-400=10 units,  BS>400 Call MD   Lantus  SoloStar 100 UNIT/ML Solostar Pen Generic drug: insulin  glargine Inject 8 Units into the skin at bedtime.   loratadine 10 MG tablet Commonly known as: CLARITIN Take 10 mg by mouth daily.   nitrofurantoin  (macrocrystal-monohydrate) 100 MG capsule Commonly known as: MACROBID  Take 1 capsule (100 mg total) by mouth every 12 (twelve) hours. Start taking on: June 16, 2024   polyethylene glycol 17 g packet Commonly known as: MIRALAX  / GLYCOLAX  Take 17 g by mouth daily as needed.   QUEtiapine  25 MG tablet Commonly known as: SEROquel  Take 1 tablet (25 mg total) by mouth at bedtime.   repaglinide  0.5 MG tablet Commonly known as: PRANDIN   Take 0.5 mg by mouth 3 (three) times daily before meals.   rosuvastatin  20 MG tablet Commonly known as: CRESTOR  Take 1 tablet (20 mg total) by mouth daily. Start taking on: June 16, 2024   senna 8.6 MG Tabs tablet Commonly known as: SENOKOT Take 1 tablet by mouth every evening.   sertraline  100 MG tablet Commonly known as: ZOLOFT  Take 100 mg by mouth daily.   spironolactone  25 MG tablet Commonly known as: ALDACTONE  Take 25 mg by mouth daily.   vitamin C 1000 MG tablet Take 1,000 mg by mouth daily.   Vitamin D3 Super  Strength 50 MCG (2000 UT) tablet Generic drug: Cholecalciferol Take 1 tablet by mouth daily.        Disposition and follow-up:   Justin Salinas was discharged from [facility] in Good condition.  At the hospital follow up visit please address:  1.   --Dementia: Patient's family requesting further diagnostic workup of dementia. Patient has family history of alzheimer's dementia; during hospitalization, noted cogwheel rigidity. Discontinued risperidone , started on Seroquel  25 mg. Continued sertraline . Neurology during this admission recommended Sinemet. Outpatient, consider Sinemet. Send referral to neurology. --Encephalopathy/Delirium: After patient's initial presentation of encephalopathy which had mostly resolved after treatment of UTI, Patient's AO waxing and waning. Called daughter and agreed possibly hospital associated delirium as he was at baseline 1/11. Daughter okay to send back to assisted living. Other workup of encephalopathy negative (UTI controlled on abx, blood cultures negative. MRI negative for acute changes, BMP stable compared to previous labs 6 months ago, no other known toxic ingestion) --UTI: completed 3 days of antibiotics in hospital. Leukocytosis resolved and afebrile. Sent home with Macrobid  for 2 days to finish 5 days of antibiotics total.  --Diabetes: consider increase in diabetic management. Home medication included Lantus  8 units and prandin  0.5 mg TID with meals + Humalog 0-10 units TID with meals. Inpatient management included Lantus  10 units and sliding scale with recommendations to add Novolog  2 units TID with meals prior to discharge.   2.  Labs / imaging needed at time of follow-up: n/a  3.  Pending labs/ test needing follow-up: B1  Follow-up Appointments:  Follow-up Information     Kennyth Worth HERO, MD. Schedule an appointment as soon as possible for a visit in 1 week(s).   Specialty: Family Medicine Contact information: [address] [place] [address] [phone]         Assisted Living Facility. Schedule an appointment as soon as possible for a visit.   Why: Make appointment for hospital follow up within assisted living facility with in 1 week of return.                 Hospital Course by problem list: Justin Salinas is a 84 y.o. person living with a history of dementia, CAD status post CABG x4, controlled T2DM, Essential HTN, BPH, lumbar radiculopathy, cognitive decline, chronic diastolic HF who presented with encephalopathy and admitted for encephalopathy workup  now being discharged on hospital day 2 with the following pertinent hospital course:  #Encephalopathy  #Possible hospital acquired delirium  Patient came in and able to respond to name and open eyes spontaneously at times without any sternal rub. He is tremulous all over and has rigidity on exam. UA had positive nitrates and large leukocytes with greater than 50 WBC and many bacteria.  CMP, CK, TSH, EEG, ammonia, VBG were all normal.  Patient's mental status improved greatly with antibiotics and Tylenol .  Encephalopathy could have likely been due to infection and high temperature.  There was a thought of NMS but patient's temperature is not as consistent with temperature seen.  Also noted bidirectional sustained nystagmus on exam.  Neuro was consulted due to these concerns and recommended MRI with patient's risk factors.  MRI did not show any acute strokes but did show chronic mesial temporal lobe atrophy, chronic small vessel ischemia is more pronounced in the left cerebellum.  Neuro was contacted about these results and recommended Sinemet. UA cultures positive for e.coli. Patient received 3 total doses of IV abx. Patient had waxing and waning orientation from baseline per family on 1/11 and evaluation on 1/12. Likely in setting of hospital acquired delirium in setting of other negative workup of encephalopathy and adequate treatment of  UTI (afebrile, resolved leukocytosis). Discharged with 2 days of Macrobid . Discussed with daughter who stated okay to return to assisted living facility. Home health ordered.   #Sepsis #Leukocytosis Did not come in with any leukocytosis but went up to 15.3 on second day of hospital stay.  Likely reactive.  Code sepsis was called and patient improved with antibiotics and antipyretics. Leukocytosis resolved by time of discharge.   #Dementia Patient was on risperidone   . 5 mg every morning and 1 mg . Held risperidone  and sertraline  100 mg daily.  Seroquel  25 mg was added to home with discontinuation of risperidone . Recommending neurology follow up for dementia workup and medication optimization.    #HTN  Held at first with concern for stroke. After MRI, Resumed home meds.    #BPH  Continue finasteride  5 mg and tamsulosin  0.4 mg daily   #Type 2 diabetes mellitus Lantus  10 units daily at bedtime( increased from 8 mg). sensitive insulin  sliding scale    #CKD stage 3b Creatinine appears to be at baseline.    #CAD s/p CABG x4  #PVD  Continue aspirin  81 mg. No acute concerns for ischemic changes. Starting on Crestor  20 mg for history of chronic strokes   #chronic diastolic CHF  No acute concerns for exacerbation. EF 60 to 65%, asymmetric LVH of septal segment. Grade 1 diastolic dysfunction    Subjective Oriented to self. Oriented to place when given options. Aware of situation (in hospital for infection). Stated it was 61 when asked what year it is. He reports some pain in his hips when he drives, but currently feeling okay otherwise. Called daughter who stated that this morning after my evaluation, patient seemed more disoriented when she called him on phone being more nonverbal. She stated that patient lives in assisted living facility. At baseline, he is generally able to recognize daughter's voice on phone and speaks more, even if not necessarily applicable to conversation. For example,  How are you doing and patient responds, I'm doing well, I've been doing lots of driving recently and I just got off the road. (Patient is retired naval architect.) Discussed with daughter who said it would be okay for patient to return to assisted living given infection under control and likely hospital delirium since he had similar waxing and waning at last hospitalization.   Discharge Exam:   BP (!) 156/80 (BP Location: Left Arm)   Pulse 72   Temp 98.7 F (37.1 C)   Resp 16   SpO2 95%  Discharge exam:  Physical Exam Constitutional:      General: He is not in acute distress.    Appearance: He is not ill-appearing, toxic-appearing or diaphoretic.  Cardiovascular:  Rate and Rhythm: Normal rate and regular rhythm.     Heart sounds: Normal heart sounds. No murmur heard.    No friction rub. No gallop.  Pulmonary:     Effort: Pulmonary effort is normal.     Breath sounds: Normal breath sounds. No stridor. No wheezing, rhonchi or rales.  Abdominal:     General: Bowel sounds are normal.     Palpations: Abdomen is soft.     Tenderness: There is no abdominal tenderness. There is no guarding.  Musculoskeletal:        General: No tenderness.     Right lower leg: No edema.     Left lower leg: No edema.     Comments: Cogwheel rigidity in UE bilaterally and mild lead pipe rigidity in LE bilaterally   Skin:    General: Skin is warm and dry.  Neurological:     Mental Status: He is alert.     Comments: AO to person, situation, and place (with choices). Not oriented to year (1963).       Pertinent Labs, Studies, and Procedures:     Latest Ref Rng & Units 06/15/2024    2:30 AM 06/14/2024    1:25 AM 06/13/2024    2:30 PM  CBC  WBC 4.0 - 10.5 K/uL 9.9  15.3  10.0   Hemoglobin 13.0 - 17.0 g/dL 88.0  89.2  87.0   Hematocrit 39.0 - 52.0 % 36.6  32.0  40.0   Platelets 150 - 400 K/uL 149  122  152        Latest Ref Rng & Units 06/15/2024    2:30 AM 06/14/2024    1:25 AM 06/13/2024    2:30  PM  CMP  Glucose 70 - 99 mg/dL 88  758  775   BUN 8 - 23 mg/dL 44  50  51   Creatinine 0.61 - 1.24 mg/dL 8.44  8.31  8.24   Sodium 135 - 145 mmol/L 142  140  139   Potassium 3.5 - 5.1 mmol/L 4.2  5.0  4.6   Chloride 98 - 111 mmol/L 108  108  106   CO2 22 - 32 mmol/L 25  24  23    Calcium  8.9 - 10.3 mg/dL 9.2  8.9  9.8   Total Protein 6.5 - 8.1 g/dL  6.0  7.5   Total Bilirubin 0.0 - 1.2 mg/dL  0.4  0.5   Alkaline Phos 38 - 126 U/L  66  81   AST 15 - 41 U/L  26  25   ALT 0 - 44 U/L  21  20     MR BRAIN WO CONTRAST Result Date: 06/14/2024 EXAM: MRI BRAIN WITHOUT CONTRAST 06/14/2024 10:12:36 AM TECHNIQUE: Multiplanar multisequence MRI of the head/brain was performed without the administration of intravenous contrast. COMPARISON: Brain MRI 07/16/2022. CLINICAL HISTORY: 84 year old male. Altered mental status. Neurological deficit, acute, stroke suspected. FINDINGS: BRAIN AND VENTRICLES: No acute infarct. No intracranial hemorrhage. No mass. No midline shift. The sella is unremarkable. Normal flow voids. Chronic disproportionate and asymmetric mesial temporal lobe atrophy greater on the right (series 9 image 19), overall brain volume does not appear significantly changed since 2024. Nonspecific chronic ventricular enlargement is stable. There is a small chronic focus of left inferior temporal lobe cortical encephalomalacia which is stable. Minimal chronic cerebral blood products on SWI, stable. Patchy chronic infarcts in the left cerebellum also appear unchanged. Negative visible internal auditory structures. ORBITS: No significant abnormality. SINUSES AND MASTOIDS:  Chronic left maxillary sinus disease, other paranasal sinuses and mastoids remain well aerated. BONES AND SOFT TISSUES: Normal marrow signal. No significant soft tissue abnormality. Negative for age visible cervical spine. IMPRESSION: 1. No acute intracranial abnormality. 2. Chronic mesial temporal lobe atrophy, chronic small and medium-sized  vessel ischemia, most pronounced in the left cerebellum. Electronically signed by: Helayne Hurst MD MD 06/14/2024 10:32 AM EST RP Workstation: [id number]   EEG adult Result Date: 06/13/2024 Shelton Arlin KIDD, MD     06/13/2024  5:56 PM Patient Name: Justin Salinas MRN: [id number] Epilepsy Attending: Arlin KIDD Shelton Referring Physician/Provider: Kandis Perkins, DO Date: 06/13/2024 Duration: 22.21 mins Patient history: 84yo M with ams. EG to evaluate for seizure Level of alertness: Awake AEDs during EEG study: None Technical aspects: This EEG study was done with scalp electrodes positioned according to the 10-20 International system of electrode placement. Electrical activity was reviewed with band pass filter of 1-70Hz , sensitivity of 7 uV/mm, display speed of 71mm/sec with a 60Hz  notched filter applied as appropriate. EEG data were recorded continuously and digitally stored.  Video monitoring was available and reviewed as appropriate. Description: EEG showed continuous generalized 3 to 6 Hz theta-delta slowing. Hyperventilation and photic stimulation were not performed.   ABNORMALITY - Continuous slow, generalized IMPRESSION: This study is suggestive of generalized cerebral dysfunction (encephalopathy). No seizures or epileptiform discharges were seen throughout the recording. Arlin KIDD Shelton   DG Chest Port 1 View Result Date: 06/13/2024 CLINICAL DATA:  Sepsis, altered level of consciousness EXAM: PORTABLE CHEST 1 VIEW COMPARISON:  11/23/2023 FINDINGS: Single frontal view of the chest demonstrates postsurgical changes from CABG. Cardiac silhouette is unremarkable. No airspace disease, effusion, or pneumothorax. No acute bony abnormalities. IMPRESSION: 1. No acute intrathoracic process. Electronically Signed   By: Ozell Daring M.D.   On: 06/13/2024 15:21     Discharge Instructions: Discharge Instructions     Call MD for:  difficulty breathing, headache or visual disturbances   Complete by: As  directed    Call MD for:  extreme fatigue   Complete by: As directed    Call MD for:  hives   Complete by: As directed    Call MD for:  persistant dizziness or light-headedness   Complete by: As directed    Call MD for:  persistant nausea and vomiting   Complete by: As directed    Call MD for:  redness, tenderness, or signs of infection (pain, swelling, redness, odor or green/yellow discharge around incision site)   Complete by: As directed    Call MD for:  severe uncontrolled pain   Complete by: As directed    Call MD for:  temperature >100.4   Complete by: As directed    Discharge instructions   Complete by: As directed    Thank you for allowing us  to be part of your care. You were hospitalized for altered mental status likely due to UTI. We treated you with IV antibiotics and switched you to oral antibiotics.   See the changes in your medications and management of your chronic conditions below:  *For your UTI -We have STARTED you on these following medications:  - Macrobid  100 mg every 12 hours with last dose occurring 1/14 (3 days of antibiotics received in hospital, a total of 5 day course)  *For your Dementia -We have STARTED you on these following medications:  - Seroquel  25 mg daily at bedtime   -We have STOPPED the following medications:  - Risperidone    *  For your high cholesterol  - We have STARTED you on Rosuvastatin  20 mg daily    Thank you for allowing us  to be part of your care.  We are glad you are feeling better,  Sallyanne Primas Internal Medicine Inpatient Teaching Service at [facility]   Increase activity slowly   Complete by: As directed        Signed: Primas Sallyanne, DO 06/15/2024, 1:39 PM     "

## 2024-06-15 NOTE — Plan of Care (Signed)
" °  Problem: Clinical Measurements: Goal: Ability to maintain clinical measurements within normal limits will improve Outcome: Progressing Goal: Will remain free from infection Outcome: Progressing   Problem: Elimination: Goal: Will not experience complications related to bowel motility Outcome: Progressing Goal: Will not experience complications related to urinary retention Outcome: Progressing   Problem: Education: Goal: Knowledge of General Education information will improve Description: Including pain rating scale, medication(s)/side effects and non-pharmacologic comfort measures Outcome: Not Progressing   Problem: Coping: Goal: Level of anxiety will decrease Outcome: Not Progressing   "

## 2024-06-15 NOTE — Inpatient Diabetes Management (Signed)
 Inpatient Diabetes Program Recommendations  AACE/ADA: New Consensus Statement on Inpatient Glycemic Control (2015)  Target Ranges:  Prepandial:   less than 140 mg/dL      Peak postprandial:   less than 180 mg/dL (1-2 hours)      Critically ill patients:  140 - 180 mg/dL   Lab Results  Component Value Date   GLUCAP 313 (H) 06/15/2024   HGBA1C 6.9 (H) 06/13/2024    Review of Glycemic Control  Latest Reference Range & Units 06/14/24 16:22 06/14/24 17:51 06/14/24 21:14 06/15/24 06:26 06/15/24 11:17  Glucose-Capillary 70 - 99 mg/dL 749 (H) 747 (H) 766 (H) 101 (H) 313 (H)   Diabetes history: DM 2 Outpatient Diabetes medications:  Humalog 0-10 units tid with meals Lantus  8 units q HS Prandin  0.5 mg tid with meals  Current orders for Inpatient glycemic control:  Novolog  0-9 units tid with meals and HS Lantus  10 units q HS  Inpatient Diabetes Program Recommendations:    If appropriate, consider adding Novolog  2 units tid with meals (hold if patient eats less than 50% or NPO).   Thanks,  Randall Bullocks, RN, BC-ADM Inpatient Diabetes Coordinator Pager [phone]  (8a-5p)

## 2024-06-17 ENCOUNTER — Encounter (HOSPITAL_COMMUNITY): Payer: Self-pay

## 2024-06-17 ENCOUNTER — Observation Stay (HOSPITAL_COMMUNITY): Admission: EM | Admit: 2024-06-17 | Discharge: 2024-06-23 | Disposition: A | Discharge status: Skilled Nursing Facility

## 2024-06-17 ENCOUNTER — Emergency Department (HOSPITAL_COMMUNITY)

## 2024-06-17 DIAGNOSIS — E119 Type 2 diabetes mellitus without complications: Secondary | ICD-10-CM

## 2024-06-17 DIAGNOSIS — I10 Essential (primary) hypertension: Secondary | ICD-10-CM | POA: Diagnosis present

## 2024-06-17 DIAGNOSIS — R2689 Other abnormalities of gait and mobility: Secondary | ICD-10-CM | POA: Diagnosis not present

## 2024-06-17 DIAGNOSIS — Z9181 History of falling: Secondary | ICD-10-CM | POA: Insufficient documentation

## 2024-06-17 DIAGNOSIS — R4189 Other symptoms and signs involving cognitive functions and awareness: Secondary | ICD-10-CM | POA: Diagnosis present

## 2024-06-17 DIAGNOSIS — N1832 Chronic kidney disease, stage 3b: Secondary | ICD-10-CM | POA: Diagnosis not present

## 2024-06-17 DIAGNOSIS — R296 Repeated falls: Secondary | ICD-10-CM

## 2024-06-17 DIAGNOSIS — F0390 Unspecified dementia without behavioral disturbance: Secondary | ICD-10-CM | POA: Insufficient documentation

## 2024-06-17 DIAGNOSIS — K219 Gastro-esophageal reflux disease without esophagitis: Secondary | ICD-10-CM | POA: Diagnosis not present

## 2024-06-17 DIAGNOSIS — I130 Hypertensive heart and chronic kidney disease with heart failure and stage 1 through stage 4 chronic kidney disease, or unspecified chronic kidney disease: Secondary | ICD-10-CM | POA: Diagnosis not present

## 2024-06-17 DIAGNOSIS — Z794 Long term (current) use of insulin: Secondary | ICD-10-CM | POA: Insufficient documentation

## 2024-06-17 DIAGNOSIS — S065X0A Traumatic subdural hemorrhage without loss of consciousness, initial encounter: Secondary | ICD-10-CM | POA: Insufficient documentation

## 2024-06-17 DIAGNOSIS — W19XXXA Unspecified fall, initial encounter: Secondary | ICD-10-CM | POA: Diagnosis not present

## 2024-06-17 DIAGNOSIS — E1122 Type 2 diabetes mellitus with diabetic chronic kidney disease: Secondary | ICD-10-CM | POA: Diagnosis not present

## 2024-06-17 DIAGNOSIS — G9340 Encephalopathy, unspecified: Secondary | ICD-10-CM | POA: Diagnosis present

## 2024-06-17 DIAGNOSIS — I5032 Chronic diastolic (congestive) heart failure: Secondary | ICD-10-CM | POA: Insufficient documentation

## 2024-06-17 DIAGNOSIS — G9341 Metabolic encephalopathy: Secondary | ICD-10-CM | POA: Diagnosis present

## 2024-06-17 DIAGNOSIS — R4181 Age-related cognitive decline: Secondary | ICD-10-CM | POA: Diagnosis not present

## 2024-06-17 DIAGNOSIS — N400 Enlarged prostate without lower urinary tract symptoms: Secondary | ICD-10-CM | POA: Diagnosis present

## 2024-06-17 DIAGNOSIS — R2681 Unsteadiness on feet: Secondary | ICD-10-CM | POA: Insufficient documentation

## 2024-06-17 DIAGNOSIS — R569 Unspecified convulsions: Secondary | ICD-10-CM | POA: Diagnosis not present

## 2024-06-17 DIAGNOSIS — Z7982 Long term (current) use of aspirin: Secondary | ICD-10-CM | POA: Diagnosis not present

## 2024-06-17 DIAGNOSIS — S065XAA Traumatic subdural hemorrhage with loss of consciousness status unknown, initial encounter: Principal | ICD-10-CM | POA: Insufficient documentation

## 2024-06-17 DIAGNOSIS — N189 Chronic kidney disease, unspecified: Secondary | ICD-10-CM | POA: Diagnosis not present

## 2024-06-17 DIAGNOSIS — M6281 Muscle weakness (generalized): Secondary | ICD-10-CM | POA: Insufficient documentation

## 2024-06-17 DIAGNOSIS — Z79899 Other long term (current) drug therapy: Secondary | ICD-10-CM | POA: Insufficient documentation

## 2024-06-17 DIAGNOSIS — F03918 Unspecified dementia, unspecified severity, with other behavioral disturbance: Secondary | ICD-10-CM | POA: Diagnosis present

## 2024-06-17 LAB — CBC WITH DIFFERENTIAL/PLATELET
Abs Immature Granulocytes: 0.19 K/uL — ABNORMAL HIGH (ref 0.00–0.07)
Basophils Absolute: 0 K/uL (ref 0.0–0.1)
Basophils Relative: 1 %
Eosinophils Absolute: 0.4 K/uL (ref 0.0–0.5)
Eosinophils Relative: 4 %
HCT: 34.4 % — ABNORMAL LOW (ref 39.0–52.0)
Hemoglobin: 11.4 g/dL — ABNORMAL LOW (ref 13.0–17.0)
Immature Granulocytes: 2 %
Lymphocytes Relative: 11 %
Lymphs Abs: 0.9 K/uL (ref 0.7–4.0)
MCH: 32.9 pg (ref 26.0–34.0)
MCHC: 33.1 g/dL (ref 30.0–36.0)
MCV: 99.1 fL (ref 80.0–100.0)
Monocytes Absolute: 0.7 K/uL (ref 0.1–1.0)
Monocytes Relative: 9 %
Neutro Abs: 6 K/uL (ref 1.7–7.7)
Neutrophils Relative %: 73 %
Platelets: 163 K/uL (ref 150–400)
RBC: 3.47 MIL/uL — ABNORMAL LOW (ref 4.22–5.81)
RDW: 12.6 % (ref 11.5–15.5)
WBC: 8.2 K/uL (ref 4.0–10.5)
nRBC: 0 % (ref 0.0–0.2)

## 2024-06-17 LAB — COMPREHENSIVE METABOLIC PANEL WITH GFR
ALT: 19 U/L (ref 0–44)
AST: 24 U/L (ref 15–41)
Albumin: 3.6 g/dL (ref 3.5–5.0)
Alkaline Phosphatase: 84 U/L (ref 38–126)
Anion gap: 11 (ref 5–15)
BUN: 49 mg/dL — ABNORMAL HIGH (ref 8–23)
CO2: 23 mmol/L (ref 22–32)
Calcium: 9.3 mg/dL (ref 8.9–10.3)
Chloride: 102 mmol/L (ref 98–111)
Creatinine, Ser: 1.65 mg/dL — ABNORMAL HIGH (ref 0.61–1.24)
GFR, Estimated: 40 mL/min — ABNORMAL LOW
Glucose, Bld: 208 mg/dL — ABNORMAL HIGH (ref 70–99)
Potassium: 4.8 mmol/L (ref 3.5–5.1)
Sodium: 136 mmol/L (ref 135–145)
Total Bilirubin: 0.3 mg/dL (ref 0.0–1.2)
Total Protein: 6.7 g/dL (ref 6.5–8.1)

## 2024-06-17 LAB — TROPONIN T, HIGH SENSITIVITY
Troponin T High Sensitivity: 39 ng/L — ABNORMAL HIGH (ref 0–19)
Troponin T High Sensitivity: 42 ng/L — ABNORMAL HIGH (ref 0–19)

## 2024-06-17 LAB — VITAMIN B1: Vitamin B1 (Thiamine): 158.3 nmol/L (ref 66.5–200.0)

## 2024-06-17 MED ORDER — ROSUVASTATIN CALCIUM 20 MG PO TABS
20.0000 mg | ORAL_TABLET | Freq: Every day | ORAL | Status: DC
  Administered 2024-06-18 – 2024-06-23 (×6): 20 mg via ORAL
  Filled 2024-06-17 (×6): qty 1

## 2024-06-17 MED ORDER — ACETAMINOPHEN 650 MG RE SUPP: 650.0000 mg | Freq: Four times a day (QID) | RECTAL | Status: DC | PRN

## 2024-06-17 MED ORDER — POLYETHYLENE GLYCOL 3350 17 G PO PACK
17.0000 g | PACK | Freq: Every day | ORAL | Status: DC | PRN
  Administered 2024-06-22: 17 g via ORAL
  Filled 2024-06-17: qty 1

## 2024-06-17 MED ORDER — LEVETIRACETAM 500 MG PO TABS
500.0000 mg | ORAL_TABLET | Freq: Two times a day (BID) | ORAL | Status: DC
  Administered 2024-06-18 – 2024-06-23 (×10): 500 mg via ORAL
  Filled 2024-06-17 (×11): qty 1

## 2024-06-17 MED ORDER — HYDRALAZINE HCL 20 MG/ML IJ SOLN: 10.0000 mg | Freq: Once | INTRAMUSCULAR | Status: DC

## 2024-06-17 MED ORDER — INSULIN ASPART 100 UNIT/ML IJ SOLN
0.0000 [IU] | Freq: Every day | INTRAMUSCULAR | Status: DC
  Administered 2024-06-19: 2 [IU] via SUBCUTANEOUS
  Administered 2024-06-20: 3 [IU] via SUBCUTANEOUS
  Filled 2024-06-17: qty 3
  Filled 2024-06-17: qty 2

## 2024-06-17 MED ORDER — FINASTERIDE 5 MG PO TABS
5.0000 mg | ORAL_TABLET | Freq: Every day | ORAL | Status: DC
  Administered 2024-06-18 – 2024-06-23 (×6): 5 mg via ORAL
  Filled 2024-06-17 (×6): qty 1

## 2024-06-17 MED ORDER — LORAZEPAM 2 MG/ML IJ SOLN
1.0000 mg | Freq: Once | INTRAMUSCULAR | Status: AC
  Administered 2024-06-17: 1 mg via INTRAVENOUS
  Filled 2024-06-17: qty 1

## 2024-06-17 MED ORDER — SERTRALINE HCL 100 MG PO TABS
100.0000 mg | ORAL_TABLET | Freq: Every day | ORAL | Status: DC
  Administered 2024-06-19 – 2024-06-23 (×5): 100 mg via ORAL
  Filled 2024-06-17 (×6): qty 1

## 2024-06-17 MED ORDER — LEVETIRACETAM (KEPPRA) 500 MG/5 ML ADULT IV PUSH
500.0000 mg | Freq: Two times a day (BID) | INTRAVENOUS | Status: DC
  Filled 2024-06-17 (×2): qty 5

## 2024-06-17 MED ORDER — INSULIN ASPART 100 UNIT/ML IJ SOLN
0.0000 [IU] | Freq: Three times a day (TID) | INTRAMUSCULAR | Status: DC
  Administered 2024-06-18 (×2): 2 [IU] via SUBCUTANEOUS
  Administered 2024-06-19: 1 [IU] via SUBCUTANEOUS
  Administered 2024-06-19: 3 [IU] via SUBCUTANEOUS
  Administered 2024-06-19: 2 [IU] via SUBCUTANEOUS
  Administered 2024-06-20: 7 [IU] via SUBCUTANEOUS
  Administered 2024-06-20: 3 [IU] via SUBCUTANEOUS
  Administered 2024-06-20: 1 [IU] via SUBCUTANEOUS
  Administered 2024-06-21: 3 [IU] via SUBCUTANEOUS
  Administered 2024-06-21: 5 [IU] via SUBCUTANEOUS
  Administered 2024-06-21 – 2024-06-22 (×2): 7 [IU] via SUBCUTANEOUS
  Administered 2024-06-22: 5 [IU] via SUBCUTANEOUS
  Administered 2024-06-22: 3 [IU] via SUBCUTANEOUS
  Administered 2024-06-23: 2 [IU] via SUBCUTANEOUS
  Filled 2024-06-17: qty 5
  Filled 2024-06-17: qty 3
  Filled 2024-06-17: qty 1
  Filled 2024-06-17: qty 2
  Filled 2024-06-17: qty 3
  Filled 2024-06-17: qty 1
  Filled 2024-06-17: qty 7
  Filled 2024-06-17 (×2): qty 3
  Filled 2024-06-17 (×2): qty 2
  Filled 2024-06-17: qty 7
  Filled 2024-06-17: qty 2
  Filled 2024-06-17: qty 5

## 2024-06-17 MED ORDER — ACETAMINOPHEN 325 MG PO TABS
650.0000 mg | ORAL_TABLET | Freq: Four times a day (QID) | ORAL | Status: DC | PRN
  Administered 2024-06-19 – 2024-06-23 (×3): 650 mg via ORAL
  Filled 2024-06-17 (×3): qty 2

## 2024-06-17 MED ORDER — CLONIDINE HCL 0.1 MG/24HR TD PTWK
0.1000 mg | MEDICATED_PATCH | TRANSDERMAL | Status: DC
  Administered 2024-06-19: 0.1 mg via TRANSDERMAL
  Filled 2024-06-17: qty 1

## 2024-06-17 MED ORDER — LEVETIRACETAM (KEPPRA) 500 MG/5 ML ADULT IV PUSH
1500.0000 mg | Freq: Once | INTRAVENOUS | Status: AC
  Administered 2024-06-17: 1500 mg via INTRAVENOUS
  Filled 2024-06-17: qty 15

## 2024-06-17 NOTE — ED Provider Notes (Signed)
 " [facility] Provider Note   CSN: [id number] Arrival date & time: 06/17/24  8197     Patient presents with: Altered Mental Status   Justin Salinas is a 84 y.o. male.   Patient here after some sort of unresponsive/may be seizure type episode.  Has had pretty major cognitive decline in the last couple weeks per family.  He is on antibiotics for UTI.  He lives at assisted living facility.  He was sit in the shower with 2 aides when he sort of went limp.  Family cannot really describe the episode.  But he just had admission for similar workup EEG MRI which were unremarkable.  He has had no seizure history.  Family history of dementia.  Family members concerned that he likely has dementia.  He is in an assisted living facility where he has a lot of support but it is not a memory unit or a lockdown unit.  She is not really worried about him wandering off but he has potential to do that.  Patient can tell me his name he does not really have any complaints.  The history is provided by the patient and a caregiver.       Prior to Admission medications  Medication Sig Start Date End Date Taking? Authorizing Provider  acetaminophen  (TYLENOL ) 325 MG tablet Take 650 mg by mouth every 6 (six) hours as needed for moderate pain (pain score 4-6). 06/15/24  Yes [provider]  alfuzosin  (UROXATRAL ) 10 MG 24 hr tablet Take 10 mg by mouth daily with breakfast.   Yes [provider]  antiseptic oral rinse (BIOTENE) LIQD 15 mLs by Mouth Rinse route 4 (four) times daily as needed for dry mouth. 06/15/24  Yes [provider]  Ascorbic Acid  (VITAMIN C) 1000 MG tablet Take 1,000 mg by mouth daily.   Yes [provider]  aspirin  81 MG tablet Take 81 mg by mouth every morning.   Yes [provider]  carvedilol  (COREG ) 25 MG tablet Take 25 mg by mouth 2 (two) times daily. Hold if SBP<105 or HR <60 06/08/24  Yes [provider]  Cholecalciferol (VITAMIN D3 SUPER STRENGTH) 50 MCG (2000 UT) TABS Take 1 tablet by mouth daily.   Yes [provider]  cloNIDine  (CATAPRES  - DOSED IN MG/24 HR) 0.1 mg/24hr patch Place 0.1 mg onto the skin every Friday. 06/16/24  Yes [provider]  finasteride  (PROSCAR ) 5 MG tablet Take 5 mg by mouth daily. 09/20/23  Yes [provider]  fluticasone (FLONASE) 50 MCG/ACT nasal spray Place 1 spray into both nostrils daily. 09/02/23  Yes [provider]  Glucose-Vitamin C (DEX4 POUCH PACK PO) Take 15 g by mouth daily as needed (hypoglycemia). 06/15/24  Yes [provider]  insulin  lispro (HUMALOG KWIKPEN) 100 UNIT/ML KwikPen Inject 0-10 Units into the skin See admin instructions. Take 3 times daily per sliding scale: If BS<70 Call MD,  IF BS is 71-150=0 units,  151-200=2 units,  201-250=4 units,  251-300=6 units,  301-350=8 units,  351-400=10 units,  BS>400 Call MD   Yes [provider]  LANTUS  SOLOSTAR 100 UNIT/ML Solostar Pen Inject 8 Units into the skin at bedtime. 12/10/22  Yes [provider]  loratadine (CLARITIN) 10 MG tablet Take 10 mg by mouth daily.   Yes [provider]  nitrofurantoin , macrocrystal-monohydrate, (MACROBID ) 100 MG capsule Take 1 capsule (100 mg total) by mouth every 12 (twelve) hours. Patient taking  differently: Take 100 mg by mouth 2 (two) times daily. For 2 days 06/16/24  Yes Juberg, Christopher, DO  polyethylene glycol (MIRALAX  / GLYCOLAX ) 17 g packet Take 17 g by mouth daily as needed for moderate constipation. 06/15/24  Yes [provider]  QUEtiapine  (SEROQUEL ) 25 MG tablet Take 1 tablet (25 mg total) by mouth at bedtime. 06/15/24  Yes Juberg, Lonni, DO  repaglinide  (PRANDIN ) 0.5 MG tablet Take 0.5 mg by mouth 3 (three) times daily before meals. 15-30 min. Before each meal   Yes [provider]  rosuvastatin  (CRESTOR ) 20 MG tablet Take 1 tablet (20 mg total) by  mouth daily. 06/16/24  Yes Harrie Lonni, DO  senna (SENOKOT) 8.6 MG TABS tablet Take 1 tablet by mouth every evening.   Yes [provider]  sertraline  (ZOLOFT ) 100 MG tablet Take 100 mg by mouth daily. 09/21/23  Yes [provider]  spironolactone  (ALDACTONE ) 25 MG tablet Take 25 mg by mouth daily. 06/13/24  Yes [provider]    Allergies: Exenatide, Linagliptin , and Lasix  [furosemide ]    Review of Systems  Updated Vital Signs BP (!) 166/65   Pulse 76   Temp 98.3 F (36.8 C)   Resp (!) 23   SpO2 99%   Physical Exam Vitals and nursing note reviewed.  Constitutional:      General: He is not in acute distress.    Appearance: He is well-developed. He is not ill-appearing.  HENT:     Head: Normocephalic and atraumatic.     Mouth/Throat:     Mouth: Mucous membranes are moist.  Eyes:     Conjunctiva/sclera: Conjunctivae normal.     Pupils: Pupils are equal, round, and reactive to light.  Cardiovascular:     Rate and Rhythm: Normal rate and regular rhythm.     Pulses: Normal pulses.     Heart sounds: Normal heart sounds. No murmur heard. Pulmonary:     Effort: Pulmonary effort is normal. No respiratory distress.     Breath sounds: Normal breath sounds.  Abdominal:     Palpations: Abdomen is soft.     Tenderness: There is no abdominal tenderness.  Musculoskeletal:        General: No swelling.     Cervical back: Normal range of motion and neck supple.  Skin:    General: Skin is warm and dry.     Capillary Refill: Capillary refill takes less than 2 seconds.  Neurological:     General: No focal deficit present.     Mental Status: He is alert.     Comments: Moves all extremities can tell me his name normal speech, somewhat agitated/moving a lot  Psychiatric:        Mood and Affect: Mood normal.     (all labs ordered are listed, but only abnormal results are displayed) Labs Reviewed  CBC WITH DIFFERENTIAL/PLATELET - Abnormal; Notable for  the following components:      Result Value   RBC 3.47 (*)    Hemoglobin 11.4 (*)    HCT 34.4 (*)    Abs Immature Granulocytes 0.19 (*)    All other components within normal limits  COMPREHENSIVE METABOLIC PANEL WITH GFR - Abnormal; Notable for the following components:   Glucose, Bld 208 (*)    BUN 49 (*)    Creatinine, Ser 1.65 (*)    GFR, Estimated 40 (*)    All other components within normal limits  TROPONIN T, HIGH SENSITIVITY - Abnormal; Notable for the  following components:   Troponin T High Sensitivity 39 (*)    All other components within normal limits  TROPONIN T, HIGH SENSITIVITY - Abnormal; Notable for the following components:   Troponin T High Sensitivity 42 (*)    All other components within normal limits  URINALYSIS, ROUTINE W REFLEX MICROSCOPIC    EKG: EKG Interpretation Date/Time:  Wednesday June 17 2024 18:36:07 EST Ventricular Rate:  76 PR Interval:  230 QRS Duration:  86 QT Interval:  407 QTC Calculation: 458 R Axis:   98  Text Interpretation: Sinus rhythm Prolonged PR interval Confirmed by Ruthe Cornet [phone]) on 06/17/2024 7:01:03 PM  Radiology: CT Cervical Spine Wo Contrast Result Date: 06/17/2024 EXAM: CT CERVICAL SPINE WITHOUT CONTRAST 06/17/2024 06:58:04 PM TECHNIQUE: CT of the cervical spine was performed without the administration of intravenous contrast. Multiplanar reformatted images are provided for review. The examination is mildly limited due to patient motion artifact. Automated exposure control, iterative reconstruction, and/or weight based adjustment of the mA/kV was utilized to reduce the radiation dose to as low as reasonably achievable. COMPARISON: None available. CLINICAL HISTORY: Recent seizure activity with neck pain, initial encounter FINDINGS: BONES AND ALIGNMENT: Seven cervical segments are well visualized. Vertebral body height is well maintained. A posterior fusion defect is noted at C1. No acute fracture or acute facet  abnormality is noted. No traumatic malalignment. DEGENERATIVE CHANGES: Multilevel disc space narrowing is noted from C4 to C7 with associated osteophytic changes. Facet hypertrophic changes are noted. SOFT TISSUES: Surrounding soft tissue structures are within normal limits. LUNG APICES: Visualized lung apices show no pneumothorax. A somewhat nodular density is noted in the right upper lobe, best seen on image number 94 of series 5, measuring up to 7 mm. IMPRESSION: 1. No acute fracture or acute facet abnormality. 2. Right upper lobe pulmonary nodule measuring up to 7 mm. Per Fleischner Society Guidelines recommend a non-contrast chest CT at 6-12 months; if patient is high risk for malignancy, consider an additional non-contrast chest CT at 18-24 months. If patient is low risk for malignancy, non-contrast chest CT at 18-24 months is optional. Electronically signed by: Oneil Devonshire MD 06/17/2024 07:09 PM EST RP Workstation: [id number]   DG Chest Portable 1 View Result Date: 06/17/2024 CLINICAL DATA:  Fall EXAM: PORTABLE CHEST 1 VIEW COMPARISON:  06/13/2024, 11/23/2023 FINDINGS: Post sternotomy changes. No focal opacity, pleural effusion or pneumothorax. Normal cardiomediastinal silhouette with aortic atherosclerosis IMPRESSION: No active disease. Electronically Signed   By: Luke Bun M.D.   On: 06/17/2024 19:06   CT Head Wo Contrast Result Date: 06/17/2024 EXAM: CT HEAD WITHOUT CONTRAST 06/17/2024 06:58:04 PM TECHNIQUE: CT of the head was performed without the administration of intravenous contrast. Automated exposure control, iterative reconstruction, and/or weight based adjustment of the mA/kV was utilized to reduce the radiation dose to as low as reasonably achievable. COMPARISON: 07/23/2022 CLINICAL HISTORY: Polytrauma, blunt. FINDINGS: BRAIN AND VENTRICLES: Acute 4 mm right parafalcine subdural hemorrhage (mBIG 1). Patchy white matter hypodensities, compatible with chronic microvascular ischemic  disease. Cerebral atrophy. Remote left cerebellar infarct. Calcific atherosclerosis. No evidence of acute infarct. No mass effect or midline shift. ORBITS: No acute abnormality. SINUSES: Chronic left maxillary mucosal thickening. SOFT TISSUES AND SKULL: No acute soft tissue abnormality. No skull fracture (mBIG 1). Traumatic Brain Injury Risk Stratification ----- Skull Fracture: No (Low - mBIG 1) Subdural Hematoma (SDH): 4 mm (mBIG 1) Subarachnoid Hemorrhage [facility]): No (Low) Epidural Hematoma (EDH): No (Low - mBIG 1) Cerebral contusion, intra-axial, intraparenchymal Hemorrhage (IPH): No (  Low - mBIG 1) Intraventricular Hemorrhage (IVH): No (Low - mBIG 1) Midline Shift 1mm or Edema/effacement of sulci/vents: No (Low - mBIG 1) IMPRESSION: 1. Acute 4 mm right parafalcine subdural hemorrhage (mBIG 1). 2. TBI risk stratification is Low - mBIG 1. Critical findings were discussed with Dr. Ruthe at 7:04 pm EST on 06/17/2024 Electronically signed by: Oneil Devonshire MD 06/17/2024 07:05 PM EST RP Workstation: [id number]     .Critical Care  Performed by: Ruthe Cornet, DO Authorized by: Ruthe Cornet, DO   Critical care provider statement:    Critical care time (minutes):  35   Critical care was necessary to treat or prevent imminent or life-threatening deterioration of the following conditions:  CNS failure or compromise   Critical care was time spent personally by me on the following activities:  Blood draw for specimens, discussions with consultants, discussions with primary provider, evaluation of patient's response to treatment, examination of patient, obtaining history from patient or surrogate, ordering and performing treatments and interventions, ordering and review of laboratory studies, ordering and review of radiographic studies, pulse oximetry, re-evaluation of patient's condition and review of old charts   Care discussed with: admitting provider      Medications Ordered in the ED  hydrALAZINE   (APRESOLINE ) injection 10 mg (0 mg Intravenous Hold 06/17/24 1939)  LORazepam  (ATIVAN ) injection 1 mg (1 mg Intravenous Given 06/17/24 2042)  levETIRAcetam  (KEPPRA ) undiluted injection 1,500 mg (1,500 mg Intravenous Given 06/17/24 2042)                                    Medical Decision Making Amount and/or Complexity of Data Reviewed Labs: ordered. Radiology: ordered.  Risk Prescription drug management. Decision regarding hospitalization.   Johnie BIRCH Mckamie is here with altered mental status and may be some sort of seizure/syncope type episode.  He was in the shower with 2 aides when he, slumped over maybe went numb in his left arm family member states.  He was just recently admitted for UTI confusion.  He had MRI EEG and overall unremarkable workup except for possible UTI.  He still antibiotics.  Does not send like he had a fall.  He can tell me his name and move all extremities on exam.  Family member states has had major cognitive decline the last several weeks.  Strong family history of dementia.  They suspect dementia but have not have been formally diagnosed.  He has a history of CAD hypertension high cholesterol.  Is not on any blood thinners.  EKG shows sinus rhythm.  No ischemic changes.  Overall syncope versus seizure versus other neurologic event/dehydration electrolyte abnormality/infection.  He did have unremarkable MRI and EEG the other day.  Seems less likely that this is seizure activity.  Blood pressure is elevated but otherwise lab works unremarkable.  Will get head CT basic labs urinalysis chest x-ray and reevaluate.  CT scan does show 4 mm subdural hematoma radiology called me on the phone.  I talked with neurosurgery, bregman, who recommends talking with neurology about seizure treatment possibly.  I talkw tihDr. Michaela w/neuro.  Will treat with Keppra  load and then do 500 mg twice daily.  We can get a repeat CT scan in the morning.  But this is not a surgical case at  this time.  Lab work is otherwise unremarkable.  Patient given Ativan  for some agitation.  Overall we will admit for further subdural treatment  care, seizure care.  Can consult neurology if having any breakthrough seizures.  Otherwise can follow-up with neurology outpatient.  This chart was dictated using voice recognition software.  Despite best efforts to proofread,  errors can occur which can change the documentation meaning.      Final diagnoses:  SDH (subdural hematoma) [facility])  Seizure [facility])    ED Discharge Orders     None          Ruthe Cornet, DO 06/17/24 2125  "

## 2024-06-17 NOTE — H&P (Addendum)
 " Date: 06/18/2024               Patient Name:  Justin Salinas MRN: [id number]  DOB: 14-Apr-1941 Age / Sex: 84 y.o., male   PCP: Kennyth Worth HERO, MD         Medical Service: Internal Medicine Teaching Service         Attending Physician: Dr. Reyes Fenton      First Contact: Sallyanne Primas, DO}    Second Contact: Dr. Lonni Africa, DO         Pager Information: First Contact Pager: [phone]   Second Contact Pager: [phone]   SUBJECTIVE   Chief Complaint: Acute Encephalopathy/ Fall  History of Present Illness: Justin Salinas is a 84 y.o. male residing in assisted living facility, with past medical history of [place], CAD s/p CABG, hypertension, type 2 diabetes, CKD stage III, chronic diastolic heart failure who presents with acute agitation, altered mental status, reported seizure-like activity.  The patient was recently hospitalized from 1/10-1/05/2025 for acute encephalopathy felt to be multifactorial, including E. coli UTI, fever, and metabolic derangements. During that admission, he was noted to have tremor and cogwheel rigidity, raising concern for extrapyramidal symptoms. His home risperidone  was discontinued abruptly, and he was discharged back to his assisted living facility on quetiapine  25 mg nightly, which per family was started on 06/16/2024. Following antibiotic treatment (CTX and Macrobid ), his mental status improved close to baseline at discharge, though he remained significantly weak and unsteady.  Per the assisted living facility and the patients daughter, on the morning of 1/13, the patient experienced a minor unwitnessed fall while getting up to use the bathroom. No head strike was reported. On 1/14, while seated in the shower with staff present, he became acutely unsteady with reported seizure-like activity and was assisted to the ground. Staff noted transient left upper extremity weakness during the episode. No definite head trauma was witnessed. Since these  events, the patient has demonstrated marked behavioral change, including severe agitation, disrobing, restlessness, and inability to be redirected, which is significantly worse than his baseline dementia per family.  The patient is unable to provide reliable history. Family denies recurrent fevers, chills, or infectious symptoms since discharge. No known bowel or urinary retention symptoms reported.  ED Course: Vitals: Hypertensive to 160s systolic, afebrile, saturating well on room air  Labs: Hgb 11.4, Creatinine 1.65 (near baseline), Glucose 208  Imaging: CT head: Acute 4 mm right parafalcine subdural hematoma without midline shift (mBIG 1) CT C-spine: No acute fracture; incidental 7 mm RUL pulmonary nodule CXR: No acute process  Received: hydralazine  10 mg IV x1 for severe hypertension, lorazepam  1 mg IV x1 for agitation, and was loaded with levetiracetam  1.5 g IV with plan to continue Keppra  500 mg BID.   Consulted: Neurosurgery and Neurology (curbside) Meds:  Patient reported:  -Alfuzosin  10 mg Amlodipine  10 mg -Aspirin  81 mg -Carvedilol  25 mg twice daily -Clonidine  0.1 mg patch every Friday -Finasteride  5 mg -Flonase -Humalog sliding scale -Lantus  8 units nightly -Loratadine 10 mg ---Macrobid  100 mg twice daily plan for completion 1/14 -MiraLAX  -Seroquel  25 nightly -Repaglinide  0.5 milligram 3 times daily -Rosuvastatin  20 mg -Senna -Sertraline  100 mg -Sertraline  25 mg -Vitamin C -Vitamin D3  Past Medical History CAD s/p CABGx4  Hypertension BPH CKD stage Type 2 diabetes mellitus Iron deficiency anemia hearing loss Posterior vitreous detachment of the left eye Recurrent falls Cognitive decline Chronic diastolic CHF Carotid artery disease   Past Surgical History Past Surgical  History:  Procedure Laterality Date   ANGIOPLASTY  09/22/1993   POBA of RCA (Dr. FABIENE Pinion)   BACK SURGERY  2000   back fusion - Dr. Victory Gunnels   CARDIAC CATHETERIZATION   11/28/1998   silent occlusion of RCA w/collaterals from left coronary system, prox LAD w/40-50% eccentric narrowing, 1st diagonal with 70-80% eccentric narrowing, 85% narrowing of prox small OM1 (Dr. FABIENE Pinion)   CAROTID DOPPLER  07/2011   left subclavian (50-69%); right bulb (0-49%); RICA (normal); left mid-distal CCA (0-49%); left bulb/prox ICA (50-69%); left vertebral with abnormal antegrade flow   CORONARY ARTERY BYPASS GRAFT N/A 05/21/2014   Procedure: CORONARY ARTERY BYPASS GRAFTING (CABG) x  four, using left internal mammary artery and right leg greater saphenous vein harvested endoscopically;  Surgeon: Elspeth JAYSON Millers, MD;  Location: [facility] OR;  Service: Open Heart Surgery;  Laterality: N/A;   INTRAOPERATIVE TRANSESOPHAGEAL ECHOCARDIOGRAM N/A 05/21/2014   Procedure: INTRAOPERATIVE TRANSESOPHAGEAL ECHOCARDIOGRAM;  Surgeon: Elspeth JAYSON Millers, MD;  Location: [facility] OR;  Service: Open Heart Surgery;  Laterality: N/A;   LEFT HEART CATHETERIZATION WITH CORONARY ANGIOGRAM N/A 05/19/2014   Procedure: LEFT HEART CATHETERIZATION WITH CORONARY ANGIOGRAM;  Surgeon: Ozell JONETTA Fell, MD;  Location: [facility] CATH LAB;  Service: Cardiovascular;  Laterality: N/A;   LUMBAR LAMINECTOMY/DECOMPRESSION MICRODISCECTOMY Left 09/23/2020   Procedure: Laminectomy and Foraminotomy - left - Lumbar three-Lumbar four;  Surgeon: Gunnels Victory, MD;  Location: [facility] OR;  Service: Neurosurgery;  Laterality: Left;   NASAL SINUS SURGERY  2010   NM MYOCAR PERF WALL MOTION  09/19/2009   bruce myoview - mild perfusion defect in basal inferior region (infarct/scar), EF 60%, low risk scan   RENAL DOPPLER  10/2011   SMA w/ 70-99% diameter reduction & high grade stenosis; R&L renals w/narrowing and increased velocities (60-99%), R kidney smaller than L   TRANSTHORACIC ECHOCARDIOGRAM  10/20/2012   EF 55-60%, moderate concentric hypertrophy, ventricular septum thickness increased, calcified MV annulus   VASECTOMY  1963     Social:  Lives  With: assisted living facility  Occupation:retired  Support: daughter  Level of Function:walker at baseline PCP:  Kennyth Worth HERO, MD  Substances: -Tobacco: none -Alcohol: none -Recreational Drug: none  Family History:  Family History  Problem Relation Age of Onset   Uterine cancer Mother    Stroke Father    Diabetes Sister    Colon polyps Brother    Heart disease Brother    Colon cancer Neg Hx    Esophageal cancer Neg Hx    Rectal cancer Neg Hx    Stomach cancer Neg Hx      Allergies: Allergies as of 06/17/2024 - Review Complete 06/17/2024  Allergen Reaction Noted   Exenatide Other (See Comments) 09/05/2019   Linagliptin  Other (See Comments) 09/05/2019   Lasix  [furosemide ] Other (See Comments) 10/04/2014    Review of Systems: A complete ROS was negative except as per HPI.   OBJECTIVE:   Physical Exam: Blood pressure (!) 166/65, pulse 76, temperature 98.3 F (36.8 C), resp. rate (!) 23, SpO2 99%.  Constitutional: Elderly male, acutely agitated, restless, poorly cooperative with exam HENT: Normocephalic, atraumatic, mucous membranes moist Eyes: Conjunctiva non-erythematous Neck: Supple Cardiovascular: Regular rate and rhythm Pulmonary/Chest: Normal work of breathing on room air, lungs clear to auscultation bilaterally (limited exam) Abdominal: Soft, non-tender, non-distended MSK: Normal bulk; increased tone and rigidity noted Neurological: Awake but disoriented, inattentive, unable to follow complex commands; moves all extremities spontaneously without clear focal deficit; cogwheel rigidity noted in upper  extremities; exam limited by agitation Skin: Warm and dry Psych: Severely agitated, disrobing, not redirectable, poor insight and judgment  Labs: CBC    Component Value Date/Time   WBC 7.5 06/18/2024 0239   RBC 3.60 (L) 06/18/2024 0239   HGB 11.4 (L) 06/18/2024 0239   HCT 35.2 (L) 06/18/2024 0239   PLT 178 06/18/2024 0239   MCV 97.8 06/18/2024 0239   MCH  31.7 06/18/2024 0239   MCHC 32.4 06/18/2024 0239   RDW 12.5 06/18/2024 0239   LYMPHSABS 0.9 06/17/2024 1830   MONOABS 0.7 06/17/2024 1830   EOSABS 0.4 06/17/2024 1830   BASOSABS 0.0 06/17/2024 1830     CMP     Component Value Date/Time   NA 137 06/18/2024 0239   NA 139 03/12/2023 0000   K 3.9 06/18/2024 0239   CL 102 06/18/2024 0239   CO2 25 06/18/2024 0239   GLUCOSE 191 (H) 06/18/2024 0239   BUN 44 (H) 06/18/2024 0239   BUN 49 (A) 03/12/2023 0000   CREATININE 1.41 (H) 06/18/2024 0239   CALCIUM  9.4 06/18/2024 0239   PROT 6.7 06/18/2024 0239   ALBUMIN  3.6 06/18/2024 0239   AST 25 06/18/2024 0239   ALT 20 06/18/2024 0239   ALKPHOS 76 06/18/2024 0239   BILITOT 0.4 06/18/2024 0239   GFRNONAA 49 (L) 06/18/2024 0239   GFRAA 46 (L) 06/19/2018 0922    Imaging: CT Cervical Spine Wo Contrast Result Date: 06/17/2024 EXAM: CT CERVICAL SPINE WITHOUT CONTRAST 06/17/2024 06:58:04 PM TECHNIQUE: CT of the cervical spine was performed without the administration of intravenous contrast. Multiplanar reformatted images are provided for review. The examination is mildly limited due to patient motion artifact. Automated exposure control, iterative reconstruction, and/or weight based adjustment of the mA/kV was utilized to reduce the radiation dose to as low as reasonably achievable. COMPARISON: None available. CLINICAL HISTORY: Recent seizure activity with neck pain, initial encounter FINDINGS: BONES AND ALIGNMENT: Seven cervical segments are well visualized. Vertebral body height is well maintained. A posterior fusion defect is noted at C1. No acute fracture or acute facet abnormality is noted. No traumatic malalignment. DEGENERATIVE CHANGES: Multilevel disc space narrowing is noted from C4 to C7 with associated osteophytic changes. Facet hypertrophic changes are noted. SOFT TISSUES: Surrounding soft tissue structures are within normal limits. LUNG APICES: Visualized lung apices show no pneumothorax. A  somewhat nodular density is noted in the right upper lobe, best seen on image number 94 of series 5, measuring up to 7 mm. IMPRESSION: 1. No acute fracture or acute facet abnormality. 2. Right upper lobe pulmonary nodule measuring up to 7 mm. Per Fleischner Society Guidelines recommend a non-contrast chest CT at 6-12 months; if patient is high risk for malignancy, consider an additional non-contrast chest CT at 18-24 months. If patient is low risk for malignancy, non-contrast chest CT at 18-24 months is optional. Electronically signed by: Oneil Devonshire MD 06/17/2024 07:09 PM EST RP Workstation: [id number]   DG Chest Portable 1 View Result Date: 06/17/2024 CLINICAL DATA:  Fall EXAM: PORTABLE CHEST 1 VIEW COMPARISON:  06/13/2024, 11/23/2023 FINDINGS: Post sternotomy changes. No focal opacity, pleural effusion or pneumothorax. Normal cardiomediastinal silhouette with aortic atherosclerosis IMPRESSION: No active disease. Electronically Signed   By: Luke Bun M.D.   On: 06/17/2024 19:06   CT Head Wo Contrast Result Date: 06/17/2024 EXAM: CT HEAD WITHOUT CONTRAST 06/17/2024 06:58:04 PM TECHNIQUE: CT of the head was performed without the administration of intravenous contrast. Automated exposure control, iterative reconstruction, and/or weight based adjustment  of the mA/kV was utilized to reduce the radiation dose to as low as reasonably achievable. COMPARISON: 07/23/2022 CLINICAL HISTORY: Polytrauma, blunt. FINDINGS: BRAIN AND VENTRICLES: Acute 4 mm right parafalcine subdural hemorrhage (mBIG 1). Patchy white matter hypodensities, compatible with chronic microvascular ischemic disease. Cerebral atrophy. Remote left cerebellar infarct. Calcific atherosclerosis. No evidence of acute infarct. No mass effect or midline shift. ORBITS: No acute abnormality. SINUSES: Chronic left maxillary mucosal thickening. SOFT TISSUES AND SKULL: No acute soft tissue abnormality. No skull fracture (mBIG 1). Traumatic Brain Injury Risk  Stratification ----- Skull Fracture: No (Low - mBIG 1) Subdural Hematoma (SDH): 4 mm (mBIG 1) Subarachnoid Hemorrhage [facility]): No (Low) Epidural Hematoma (EDH): No (Low - mBIG 1) Cerebral contusion, intra-axial, intraparenchymal Hemorrhage (IPH): No (Low - mBIG 1) Intraventricular Hemorrhage (IVH): No (Low - mBIG 1) Midline Shift 1mm or Edema/effacement of sulci/vents: No (Low - mBIG 1) IMPRESSION: 1. Acute 4 mm right parafalcine subdural hemorrhage (mBIG 1). 2. TBI risk stratification is Low - mBIG 1. Critical findings were discussed with Dr. Ruthe at 7:04 pm EST on 06/17/2024 Electronically signed by: Oneil Devonshire MD 06/17/2024 07:05 PM EST RP Workstation: [id number]     EKG: personally reviewed my interpretation is Sinus rhythm Prolonged PR interval   ASSESSMENT & PLAN:   Assessment & Plan by Problem: Principal Problem:   Acute encephalopathy Active Problems:   Controlled type 2 diabetes mellitus, with long-term current use of insulin  (HCC)   Essential hypertension   GERD   BPH (benign prostatic hyperplasia)   Recurrent falls   Cognitive decline   Dementia with behavioral disturbance (HCC)   Subdural hematoma (HCC)   Justin Salinas is a 84 y.o. from ALF with dementia/cognitive decline and recent UTI-related encephalopathy admission (1/10-1/12), now re-presenting 1/14 after shower event concerning for syncope vs seizure with agitation and CT showing small acute 4 mm right parafalcine SDH (mBIG 1) without mass effect.   #Acute SDH, 4 mm right parafalcine (mBIG 1) Small SDH,low risk, no shift, neuroexam reliability limited by baseline dementia, agitation.  On aspirin  at baseline.  There is not any witnessed head trauma, but had to episode of fall tonight and yesterday morning, given his age, event mild head trauma can cause SDH. Plan - Repeat CT head in 12 hours - Neuro checks (q4h initially depending on unit). - Hold aspirin , anticoagulants initially - Maintain SBP <160, avoid  hypotension - Fall precautions, PT/OT when stable - Neurosurgery aware; no surgical intervention at this time   #Spell: syncope vs seizure vs mechanical fall Mixed story; transient unilateral limpness reported; subdural could be traumatic or incidental but treat as traumatic until proven otherwise. Plan: - Keppra  per neuro: load then 500 mg BID (dose adjust if renal function worsens). - Spot EEG  - Check basic reversible contributors: CBC/CMP/Mg/Phos, VBG if needed. - Check orthostatics when safe  #Acute Encephalopathy  #Agitation Recent delirium/UTI admission; now back in ED/hospital with SDH, high delirium burden Presentation is most consistent with multifactorial hyperactive delirium superimposed on dementia, rather than primary psychiatric disease or abrupt dementia progression. Dopaminergic rebound from abrupt risperidone  discontinuation, recent infection and hospitalization, and acute intracranial injury likely all contributed. The presence of cogwheel rigidity raises concern for underlying parkinsonian features, supporting cautious antipsychotic use. A reported seizure-like episode in the setting of acute SDH warrants temporary seizure prophylaxis. Plan - Delirium bundle: pain/constipation/urinary retention, sleep/wake, reorientation, minimize restraints when possible. - Avoid benzos if possible (Ativan  may worsen delirium/resp depression) - If med needed: prefer low-dose  quetiapine  (already on 25 qHS)  - Do not restart risperidone  - Avoid haloperidol /risperidone  if Parkinsonism/LBD suspected. - Consider melatonin nightly. - SLP - Check UA/culture (U/A returned with no infection) - UDS: Neg - TSH, B1: NL in prior admission     #Recent UTI (E. coli)   He finished a short course (3 days IV + 2 days macrobid  planned). Recurrent delirium could be recurrent infection, urinary retention or medication side effect. (Beer criteria) Plan - Repeat UA/UC if fever/leukocytosis/urinary  symptoms or unclear baseline. - Bladder scan/PVR; treat retention if present.  #HTN / CAD / HFpEF BP swings can worsen bleeding risk; also avoid overly aggressive drops. Plan - Continue home BP regimen carefully (carvedilol , clonidine  patch, spironolactone , amlodipine ) after clear SLP - PRN IV antihypertensives only if SBP persistently >180 -  avoid hypotension/orthostasis. - Telemetry given syncope concern and CAD history.  #DM2 / RXI6a Stress hyperglycemia contributes to delirium, CKD affects Keppra  dosing and med selection. Plan - Sliding Scale Insulin  - Hold home meds - Trend Cr; renally dose meds.  # VTE prophylaxis  Small SDH   Plan: -Avoid chemical VTE ppx initially. - Mechanical prophylaxis now.   Best practice: Diet: NPO VTE: SCDs IVF: None,None Code: DNI  Disposition planning: Prior to Admission Living Arrangement: ALF Anticipated Discharge Location: TBD  Dispo: Admit patient to Observation with expected length of stay less than 2 midnights.  Signed: Bernadine Manos, MD Internal Medicine Resident  06/18/2024, 6:15 AM  On Call pager: [phone]   "

## 2024-06-17 NOTE — ED Triage Notes (Signed)
 [facility] reports pt coming from [facility] for a witness seizure in the shower. Staff states pt does not have a seizure history. Dementia at baseline. Alert to person at baseline per staff. Staff does not report any injuries.

## 2024-06-17 NOTE — Hospital Course (Addendum)
 Daughter's goals:  - have the brain bleed resolve  - wants current behaviors under control  - answer the question to what will he need if the assisted living cannot provide all care FUNERAL TOMORROW FROM 2-4:30. CALL BEFORE THEN   No labs 1/17, consider checking 1/18 (every other day)?  ____________________________________________  #Acute Encephalopathy  #Agitation #Acute SDH, 4 mm right parafalcine (mBIG 1) Recent discharge and treatment for UTI 1/12.  Changes to home medication regiment at last hospitalization: previously on risperidone  for 6 months but was discontinued due to concerns for NMS and cogwheel rigidity. After discharge had 2 episodes of fall, one episode on 1/13 reported from staff at assisted living facility. This was day Seroquel  was started. On 1/14 is had another fall and staff reported seizure like activities. Denied head trauma x2. ED CT scan showed 4 mm SDH. UDS negative. TSH, B1: NL in prior admission. Current encephalopathy differential diagnosis includes: SDH vs dopaminergic rebound with cessation of risperidone  vs seizure versus progressive dementia. Curbside consult with neurology recommended continued cessation of risperidone  given suspicion of parkinsonian features. Recommended continuing Seroquel  25 mg nightly and consider Cinamet inpatient. Palliative care consulted for conversation with daughter. Delirium precautions placed. Benzos avoided. Melatonin nightly. ASA and anticoagulation held. Continued sertraline  and Seroquel  25 mg. SBP goal <160.    #Spell: syncope vs seizure vs mechanical fall Mixed story; transient unilateral limpness reported; subdural could be traumatic or incidental but treat as traumatic until proven otherwise.  EEG unremarkable for seizure.  Electrolytes WNL. Given Keppra  loading dose and then started on Keppra  500 mg BID for 7 days ***.    #Recent UTI (E. coli)  Finished course of antibiotics.  Urinalysis this admission unremarkable.    #HTN  / CAD / HFpEF Goal SBP <160. Avoid hypotension. Home medications amlodipine  10 mg and spironolactone  25 mg daily restarted and PRN hydralazine  added for SBP not at goal.   #DM2 / RXI6a Stress hyperglycemia contributes to delirium, CKD affects Keppra  dosing and med selection. Cr. 1.48 (1.41 1/15). On sliding scale insulin .   # VTE prophylaxis Small SDH, Avoided chemical VTE ppx initially. Continued with mechanical prophylaxis.

## 2024-06-17 NOTE — Progress Notes (Addendum)
" ° °  Providing Compassionate, Quality Care - Together   Mr. Nosbisch is an 84 year old male who was brought to the [facility] Emergency Department via [facility] from [facility]. He suffered a fall in the shower at his assisted living facility. He was reported to have had a seizure by the staff members who were assisting him. He is not on an anticoagulant. CT imaging performed in the [facility] Emergency Department revealed a small 4 mm right parafalcine SDH. This does not require any surgical intervention. Recommend further work up by Neurology for possible seizure activity. By report, he is close to his baseline mental status now. Family does have concerns that Mr. Stare has worsening dementia. No need for further CT imaging unless there is a significant decline in the patient's neuro exam.     Gerard Beck, DNP, AGNP-C Nurse Practitioner  [facility] [address], [place], [place] [address] P: [phone]    F: [phone]   "

## 2024-06-18 ENCOUNTER — Observation Stay (HOSPITAL_COMMUNITY)

## 2024-06-18 DIAGNOSIS — S065XAA Traumatic subdural hemorrhage with loss of consciousness status unknown, initial encounter: Secondary | ICD-10-CM | POA: Diagnosis not present

## 2024-06-18 DIAGNOSIS — G2570 Drug induced movement disorder, unspecified: Secondary | ICD-10-CM | POA: Diagnosis not present

## 2024-06-18 DIAGNOSIS — B9620 Unspecified Escherichia coli [E. coli] as the cause of diseases classified elsewhere: Secondary | ICD-10-CM | POA: Diagnosis not present

## 2024-06-18 DIAGNOSIS — E1165 Type 2 diabetes mellitus with hyperglycemia: Secondary | ICD-10-CM

## 2024-06-18 DIAGNOSIS — F0390 Unspecified dementia without behavioral disturbance: Secondary | ICD-10-CM

## 2024-06-18 DIAGNOSIS — R569 Unspecified convulsions: Secondary | ICD-10-CM | POA: Diagnosis not present

## 2024-06-18 DIAGNOSIS — N1832 Chronic kidney disease, stage 3b: Secondary | ICD-10-CM | POA: Diagnosis not present

## 2024-06-18 DIAGNOSIS — E1122 Type 2 diabetes mellitus with diabetic chronic kidney disease: Secondary | ICD-10-CM | POA: Diagnosis not present

## 2024-06-18 DIAGNOSIS — I5030 Unspecified diastolic (congestive) heart failure: Secondary | ICD-10-CM

## 2024-06-18 DIAGNOSIS — Z7901 Long term (current) use of anticoagulants: Secondary | ICD-10-CM

## 2024-06-18 DIAGNOSIS — N390 Urinary tract infection, site not specified: Secondary | ICD-10-CM | POA: Diagnosis not present

## 2024-06-18 DIAGNOSIS — W1830XA Fall on same level, unspecified, initial encounter: Secondary | ICD-10-CM | POA: Diagnosis not present

## 2024-06-18 DIAGNOSIS — G9340 Encephalopathy, unspecified: Secondary | ICD-10-CM | POA: Diagnosis not present

## 2024-06-18 DIAGNOSIS — I130 Hypertensive heart and chronic kidney disease with heart failure and stage 1 through stage 4 chronic kidney disease, or unspecified chronic kidney disease: Secondary | ICD-10-CM | POA: Diagnosis not present

## 2024-06-18 LAB — URINALYSIS, ROUTINE W REFLEX MICROSCOPIC
Bilirubin Urine: NEGATIVE
Glucose, UA: NEGATIVE mg/dL
Ketones, ur: NEGATIVE mg/dL
Leukocytes,Ua: NEGATIVE
Nitrite: NEGATIVE
Protein, ur: NEGATIVE mg/dL
Specific Gravity, Urine: 1.011 (ref 1.005–1.030)
pH: 5 (ref 5.0–8.0)

## 2024-06-18 LAB — CULTURE, BLOOD (ROUTINE X 2)
Culture: NO GROWTH
Culture: NO GROWTH
Special Requests: ADEQUATE

## 2024-06-18 LAB — CBC
HCT: 35.2 % — ABNORMAL LOW (ref 39.0–52.0)
Hemoglobin: 11.4 g/dL — ABNORMAL LOW (ref 13.0–17.0)
MCH: 31.7 pg (ref 26.0–34.0)
MCHC: 32.4 g/dL (ref 30.0–36.0)
MCV: 97.8 fL (ref 80.0–100.0)
Platelets: 178 K/uL (ref 150–400)
RBC: 3.6 MIL/uL — ABNORMAL LOW (ref 4.22–5.81)
RDW: 12.5 % (ref 11.5–15.5)
WBC: 7.5 K/uL (ref 4.0–10.5)
nRBC: 0 % (ref 0.0–0.2)

## 2024-06-18 LAB — PHOSPHORUS: Phosphorus: 3.6 mg/dL (ref 2.5–4.6)

## 2024-06-18 LAB — COMPREHENSIVE METABOLIC PANEL WITH GFR
ALT: 20 U/L (ref 0–44)
AST: 25 U/L (ref 15–41)
Albumin: 3.6 g/dL (ref 3.5–5.0)
Alkaline Phosphatase: 76 U/L (ref 38–126)
Anion gap: 10 (ref 5–15)
BUN: 44 mg/dL — ABNORMAL HIGH (ref 8–23)
CO2: 25 mmol/L (ref 22–32)
Calcium: 9.4 mg/dL (ref 8.9–10.3)
Chloride: 102 mmol/L (ref 98–111)
Creatinine, Ser: 1.41 mg/dL — ABNORMAL HIGH (ref 0.61–1.24)
GFR, Estimated: 49 mL/min — ABNORMAL LOW
Glucose, Bld: 191 mg/dL — ABNORMAL HIGH (ref 70–99)
Potassium: 3.9 mmol/L (ref 3.5–5.1)
Sodium: 137 mmol/L (ref 135–145)
Total Bilirubin: 0.4 mg/dL (ref 0.0–1.2)
Total Protein: 6.7 g/dL (ref 6.5–8.1)

## 2024-06-18 LAB — URINE DRUG SCREEN
Amphetamines: NEGATIVE
Barbiturates: NEGATIVE
Benzodiazepines: NEGATIVE
Cocaine: NEGATIVE
Fentanyl: NEGATIVE
Methadone Scn, Ur: NEGATIVE
Opiates: NEGATIVE
Tetrahydrocannabinol: NEGATIVE

## 2024-06-18 LAB — MAGNESIUM: Magnesium: 2 mg/dL (ref 1.7–2.4)

## 2024-06-18 LAB — GLUCOSE, CAPILLARY
Glucose-Capillary: 156 mg/dL — ABNORMAL HIGH (ref 70–99)
Glucose-Capillary: 148 mg/dL — ABNORMAL HIGH (ref 70–99)

## 2024-06-18 LAB — CBG MONITORING, ED
Glucose-Capillary: 125 mg/dL — ABNORMAL HIGH (ref 70–99)
Glucose-Capillary: 178 mg/dL — ABNORMAL HIGH (ref 70–99)
Glucose-Capillary: 197 mg/dL — ABNORMAL HIGH (ref 70–99)

## 2024-06-18 MED ORDER — AMLODIPINE BESYLATE 5 MG PO TABS
10.0000 mg | ORAL_TABLET | Freq: Every day | ORAL | Status: DC
  Administered 2024-06-18 – 2024-06-23 (×6): 10 mg via ORAL
  Filled 2024-06-18 (×6): qty 2

## 2024-06-18 MED ORDER — QUETIAPINE FUMARATE 25 MG PO TABS
25.0000 mg | ORAL_TABLET | Freq: Every day | ORAL | Status: DC
  Administered 2024-06-18 – 2024-06-22 (×5): 25 mg via ORAL
  Filled 2024-06-18 (×5): qty 1

## 2024-06-18 MED ORDER — OLANZAPINE 10 MG IM SOLR
2.5000 mg | Freq: Once | INTRAMUSCULAR | Status: AC
  Administered 2024-06-18: 2.5 mg via INTRAMUSCULAR
  Filled 2024-06-18: qty 10

## 2024-06-18 MED ORDER — HYDRALAZINE HCL 50 MG PO TABS
50.0000 mg | ORAL_TABLET | Freq: Three times a day (TID) | ORAL | Status: DC | PRN
  Administered 2024-06-22: 50 mg via ORAL
  Filled 2024-06-18: qty 1

## 2024-06-18 MED ORDER — AMLODIPINE BESYLATE 5 MG PO TABS: 5.0000 mg | ORAL_TABLET | Freq: Every day | ORAL | Status: DC

## 2024-06-18 MED ORDER — STERILE WATER FOR INJECTION IJ SOLN
INTRAMUSCULAR | Status: AC
  Administered 2024-06-18: 0.5 mL
  Filled 2024-06-18: qty 10

## 2024-06-18 MED ORDER — HYDRALAZINE HCL 25 MG PO TABS
25.0000 mg | ORAL_TABLET | Freq: Three times a day (TID) | ORAL | Status: DC | PRN
  Administered 2024-06-18: 25 mg via ORAL
  Filled 2024-06-18: qty 1

## 2024-06-18 MED ORDER — MELATONIN 3 MG PO TABS
3.0000 mg | ORAL_TABLET | Freq: Every day | ORAL | Status: DC
  Administered 2024-06-18 – 2024-06-22 (×5): 3 mg via ORAL
  Filled 2024-06-18 (×5): qty 1

## 2024-06-18 MED ORDER — SPIRONOLACTONE 25 MG PO TABS
25.0000 mg | ORAL_TABLET | Freq: Every day | ORAL | Status: DC
  Administered 2024-06-18 – 2024-06-23 (×6): 25 mg via ORAL
  Filled 2024-06-18 (×6): qty 1

## 2024-06-18 NOTE — Progress Notes (Signed)
 EEG complete - results pending

## 2024-06-18 NOTE — Progress Notes (Signed)
 "  HD#0 SUBJECTIVE:  Patient Summary: Justin Salinas is a 84 y.o. male residing in assisted living facility, with past medical history of [place], CAD s/p CABG, hypertension, type 2 diabetes, CKD stage III, chronic diastolic heart failure who presents with acute agitation, altered mental status, reported seizure-like activity and admitted for encephalopathy workup and SDH.   Overnight Events: Patient was admitted overnight.  Interim History: Patient seen bedside while getting EEG. In response to asking patient's name he stated, ill show you. Patient was following commands such as open your mouth and wiggle your toes.   OBJECTIVE:  Vital Signs: Vitals:   06/18/24 0100 06/18/24 0327 06/18/24 0345 06/18/24 0400  BP:  (!) 128/116  128/84  Pulse: 79 72    Resp: (!) 23 (!) 22  (!) 21  Temp:  (!) 97.4 F (36.3 C)    SpO2: 97% 98% 100%    Supplemental O2: Room Air SpO2: 100 %  There were no vitals filed for this visit.  No intake or output data in the 24 hours ending 06/18/24 0657 Net IO Since Admission: No IO data has been entered for this period [06/18/24 0657]  Physical Exam: Physical Exam Constitutional:      General: He is not in acute distress.    Appearance: He is not ill-appearing or toxic-appearing.  Eyes:     Pupils: Pupils are equal, round, and reactive to light.  Cardiovascular:     Rate and Rhythm: Normal rate and regular rhythm.     Pulses: Normal pulses.     Heart sounds: No murmur heard.    No friction rub. No gallop.  Pulmonary:     Effort: Pulmonary effort is normal.     Breath sounds: Normal breath sounds. No stridor. No wheezing, rhonchi or rales.  Abdominal:     General: Bowel sounds are normal.     Palpations: Abdomen is soft.     Tenderness: There is no guarding.  Musculoskeletal:     Right lower leg: No edema.     Left lower leg: No edema.  Skin:    General: Skin is warm and dry.  Neurological:     Mental Status: He is alert. He is  disoriented.     Comments: Alert, not oriented.  Patient was getting EEG, mittens on.  Moving all extremities.  Following commands.     Patient Lines/Drains/Airways Status     Active Line/Drains/Airways     Name Placement date Placement time Site Days   Peripheral IV 06/17/24 16 G 1 Anterior;Distal;Left;Upper Arm 06/17/24  1813  Arm  1            Pertinent labs and imaging:      Latest Ref Rng & Units 06/18/2024    2:39 AM 06/17/2024    6:30 PM 06/15/2024    2:30 AM  CBC  WBC 4.0 - 10.5 K/uL 7.5  8.2  9.9   Hemoglobin 13.0 - 17.0 g/dL 88.5  88.5  88.0   Hematocrit 39.0 - 52.0 % 35.2  34.4  36.6   Platelets 150 - 400 K/uL 178  163  149        Latest Ref Rng & Units 06/18/2024    2:39 AM 06/17/2024    6:30 PM 06/15/2024    2:30 AM  CMP  Glucose 70 - 99 mg/dL 808  791  88   BUN 8 - 23 mg/dL 44  49  44   Creatinine 0.61 - 1.24 mg/dL 8.58  1.65  1.55   Sodium 135 - 145 mmol/L 137  136  142   Potassium 3.5 - 5.1 mmol/L 3.9  4.8  4.2   Chloride 98 - 111 mmol/L 102  102  108   CO2 22 - 32 mmol/L 25  23  25    Calcium  8.9 - 10.3 mg/dL 9.4  9.3  9.2   Total Protein 6.5 - 8.1 g/dL 6.7  6.7    Total Bilirubin 0.0 - 1.2 mg/dL 0.4  0.3    Alkaline Phos 38 - 126 U/L 76  84    AST 15 - 41 U/L 25  24    ALT 0 - 44 U/L 20  19      CT Cervical Spine Wo Contrast Result Date: 06/17/2024 EXAM: CT CERVICAL SPINE WITHOUT CONTRAST 06/17/2024 06:58:04 PM TECHNIQUE: CT of the cervical spine was performed without the administration of intravenous contrast. Multiplanar reformatted images are provided for review. The examination is mildly limited due to patient motion artifact. Automated exposure control, iterative reconstruction, and/or weight based adjustment of the mA/kV was utilized to reduce the radiation dose to as low as reasonably achievable. COMPARISON: None available. CLINICAL HISTORY: Recent seizure activity with neck pain, initial encounter FINDINGS: BONES AND ALIGNMENT: Seven cervical  segments are well visualized. Vertebral body height is well maintained. A posterior fusion defect is noted at C1. No acute fracture or acute facet abnormality is noted. No traumatic malalignment. DEGENERATIVE CHANGES: Multilevel disc space narrowing is noted from C4 to C7 with associated osteophytic changes. Facet hypertrophic changes are noted. SOFT TISSUES: Surrounding soft tissue structures are within normal limits. LUNG APICES: Visualized lung apices show no pneumothorax. A somewhat nodular density is noted in the right upper lobe, best seen on image number 94 of series 5, measuring up to 7 mm. IMPRESSION: 1. No acute fracture or acute facet abnormality. 2. Right upper lobe pulmonary nodule measuring up to 7 mm. Per Fleischner Society Guidelines recommend a non-contrast chest CT at 6-12 months; if patient is high risk for malignancy, consider an additional non-contrast chest CT at 18-24 months. If patient is low risk for malignancy, non-contrast chest CT at 18-24 months is optional. Electronically signed by: Oneil Devonshire MD 06/17/2024 07:09 PM EST RP Workstation: [id number]   DG Chest Portable 1 View Result Date: 06/17/2024 CLINICAL DATA:  Fall EXAM: PORTABLE CHEST 1 VIEW COMPARISON:  06/13/2024, 11/23/2023 FINDINGS: Post sternotomy changes. No focal opacity, pleural effusion or pneumothorax. Normal cardiomediastinal silhouette with aortic atherosclerosis IMPRESSION: No active disease. Electronically Signed   By: Luke Bun M.D.   On: 06/17/2024 19:06   CT Head Wo Contrast Result Date: 06/17/2024 EXAM: CT HEAD WITHOUT CONTRAST 06/17/2024 06:58:04 PM TECHNIQUE: CT of the head was performed without the administration of intravenous contrast. Automated exposure control, iterative reconstruction, and/or weight based adjustment of the mA/kV was utilized to reduce the radiation dose to as low as reasonably achievable. COMPARISON: 07/23/2022 CLINICAL HISTORY: Polytrauma, blunt. FINDINGS: BRAIN AND VENTRICLES:  Acute 4 mm right parafalcine subdural hemorrhage (mBIG 1). Patchy white matter hypodensities, compatible with chronic microvascular ischemic disease. Cerebral atrophy. Remote left cerebellar infarct. Calcific atherosclerosis. No evidence of acute infarct. No mass effect or midline shift. ORBITS: No acute abnormality. SINUSES: Chronic left maxillary mucosal thickening. SOFT TISSUES AND SKULL: No acute soft tissue abnormality. No skull fracture (mBIG 1). Traumatic Brain Injury Risk Stratification ----- Skull Fracture: No (Low - mBIG 1) Subdural Hematoma (SDH): 4 mm (mBIG 1) Subarachnoid Hemorrhage [facility]): No (Low) Epidural  Hematoma (EDH): No (Low - mBIG 1) Cerebral contusion, intra-axial, intraparenchymal Hemorrhage (IPH): No (Low - mBIG 1) Intraventricular Hemorrhage (IVH): No (Low - mBIG 1) Midline Shift 1mm or Edema/effacement of sulci/vents: No (Low - mBIG 1) IMPRESSION: 1. Acute 4 mm right parafalcine subdural hemorrhage (mBIG 1). 2. TBI risk stratification is Low - mBIG 1. Critical findings were discussed with Dr. Ruthe at 7:04 pm EST on 06/17/2024 Electronically signed by: Oneil Devonshire MD 06/17/2024 07:05 PM EST RP Workstation: [id number]    ASSESSMENT/PLAN:  Assessment: Principal Problem:   Acute encephalopathy Active Problems:   Controlled type 2 diabetes mellitus, with long-term current use of insulin  (HCC)   Essential hypertension   GERD   BPH (benign prostatic hyperplasia)   Recurrent falls   Cognitive decline   Dementia with behavioral disturbance (HCC)   Subdural hematoma (HCC)   Plan: #Acute Encephalopathy  #Agitation #Acute SDH, 4 mm right parafalcine (mBIG 1) Patient presents after recent discharge and treatment for UTI.  Changes to home medication regiment: previously on risperidone  for 6 months but was discontinued due to concerns for NMS and cogwheel rigidity. After discharge had 2 episodes of fall, one episode on 1/13 reported from staff at assisted living facility. This was  day Seroquel  was started. On 1/14 is had another fall and staff reported seizure like activities. Denied head trauma x2. ED CT scan showed 4 mm SDH. UDS negative. TSH, B1: NL in prior admission. Glucose >100.   Current encephalopathy differential diagnosis includes: SDH vs dopaminergic rebound with cessation of risperidone  vs seizure versus progressive dementia versus medication induced encephalopathy (Macrobid ).  - SDH: Neurosurgery consulted and did not recommend surgery. Repeat CT: Stable small right peripheral subdermal hematoma measuring approximately 4 mm without mass effect.  Moderate generalized volume loss and mild to moderate cerebral white matter disease.  Moderate chronic appearing mucosal disease with left maxillary sinus. - Seizure: Per neurology, technically difficult EEG study but suggestive of generalized cerebral dysfunction.  No seizures or epileptiform changes seen throughout recording. - Medication induced: Finished treatment of UTI.  Will continue to monitor/optimize medication management. - Dopaminergic rebound: With suspected parkinsonism/LBD, avoiding haloperidol  and risperidone .   - Progressive dementia: Patient unfortunately has dementia with documented cognitive decline.  Has previous documentation of hospital-acquired delirium, agitation, behavioral disturbances also seen in 2025.  - Delirium bundle: pain/constipation/urinary retention, sleep/wake, reorientation, minimize restraints when possible. - Avoid benzos if possible (Ativan  may worsen delirium/resp depression) - If med needed: prefer low-dose quetiapine  - Sertraline  100 mg - Avoid haloperidol /risperidone  if Parkinsonism/LBD suspected. - Consider melatonin nightly. - SLP - Neuro checks (q4h initially depending on unit). - Hold aspirin , anticoagulants initially - Maintain SBP <160, avoid hypotension - Fall precautions, PT/OT when stable  #Spell: syncope vs seizure vs mechanical fall Mixed story; transient  unilateral limpness reported; subdural could be traumatic or incidental but treat as traumatic until proven otherwise.  EEG unremarkable for seizure.  Electrolytes WNL. Plan: - Keppra  per neuro: load then 500 mg BID (dose adjust if renal function worsens). - Check orthostatics when safe   #Recent UTI (E. coli)  Finished course of antibiotics.  Urinalysis this admission unremarkable.   - Repeat UA/UC if fever/leukocytosis/urinary symptoms or unclear baseline. - Bladder scan/PVR; treat retention if present.  #HTN / CAD / HFpEF Last SBP 155/85.  SBP cuff on the leg. - Continue home BP regimen carefully (carvedilol , clonidine  patch, spironolactone , amlodipine ) after clear SLP - PRN IV antihypertensives only if SBP persistently >180 - Avoid hypotension/orthostasis. -  Telemetry given syncope concern and CAD history.  #DM2 / RXI6a Stress hyperglycemia contributes to delirium, CKD affects Keppra  dosing and med selection. - Sliding Scale Insulin  - Hold home meds - Trend Cr; renally dose meds.  # VTE prophylaxis Small SDH   - Avoid chemical VTE ppx initially. - Mechanical prophylaxis now.   Best Practice: Diet: N.p.o. VTE: SCDs Start: 06/17/24 2159 Code: DNR-limited  Disposition planning: Therapy Recs: Pending, DME: other recommendations Family Contact: daughter, to be notified. DISPO: Anticipated discharge pending clinical improvement to assisted living facility  Signature:  Daanya Lanphier [facility]  6:57 AM, 06/18/2024  On Call pager [phone]  "

## 2024-06-18 NOTE — ED Notes (Signed)
 Pt was hands was placed in mittens after informing attending MD that he remains restless.

## 2024-06-18 NOTE — Procedures (Signed)
 Patient Name: Kaiser Belluomini Ortlieb  MRN: [id number]  Epilepsy Attending: Arlin MALVA Krebs  Referring Physician/Provider: Ruthe Cornet, DO  Date: 06/18/2024 Duration: 21.16 mins  Patient history:  84 y.o. presenting 1/14 after shower event concerning for syncope vs seizure with agitation and CT showing small acute 4 mm right parafalcine SDH (mBIG 1) without mass effect. EEG to evaluate for seizure  Level of alertness: Awake  AEDs during EEG study: LEV  Technical aspects: This EEG study was done with scalp electrodes positioned according to the 10-20 International system of electrode placement. Electrical activity was reviewed with band pass filter of 1-70Hz , sensitivity of 7 uV/mm, display speed of 49mm/sec with a 60Hz  notched filter applied as appropriate. EEG data were recorded continuously and digitally stored.  Video monitoring was available and reviewed as appropriate.  Description: EEG showed continuous generalized 3 to 6 Hz theta-delta slowing. Hyperventilation and photic stimulation were not performed.     Of note study was technically difficult due to significant myogenic and electrode artifact  ABNORMALITY - Continuous slow, generalized  IMPRESSION: This technically difficult study is suggestive of generalized cerebral dysfunction (encephalopathy). No seizures or epileptiform discharges were seen throughout the recording.  Cheryllynn Sarff O Tiffanie [place]

## 2024-06-18 NOTE — Plan of Care (Signed)
  Problem: Education: Goal: Ability to describe self-care measures that may prevent or decrease complications (Diabetes Survival Skills Education) will improve Outcome: Progressing Goal: Individualized Educational Video(s) Outcome: Progressing   Problem: Coping: Goal: Ability to adjust to condition or change in health will improve Outcome: Progressing   Problem: Fluid Volume: Goal: Ability to maintain a balanced intake and output will improve Outcome: Progressing   Problem: Health Behavior/Discharge Planning: Goal: Ability to identify and utilize available resources and services will improve Outcome: Progressing Goal: Ability to manage health-related needs will improve Outcome: Progressing   Problem: Metabolic: Goal: Ability to maintain appropriate glucose levels will improve Outcome: Progressing   Problem: Nutritional: Goal: Maintenance of adequate nutrition will improve Outcome: Progressing Goal: Progress toward achieving an optimal weight will improve Outcome: Progressing   Problem: Skin Integrity: Goal: Risk for impaired skin integrity will decrease Outcome: Progressing   Problem: Tissue Perfusion: Goal: Adequacy of tissue perfusion will improve Outcome: Progressing   Problem: Safety: Goal: Non-violent Restraint(s) Outcome: Progressing   Problem: Education: Goal: Knowledge of General Education information will improve Description: Including pain rating scale, medication(s)/side effects and non-pharmacologic comfort measures Outcome: Progressing   Problem: Health Behavior/Discharge Planning: Goal: Ability to manage health-related needs will improve Outcome: Progressing   Problem: Clinical Measurements: Goal: Ability to maintain clinical measurements within normal limits will improve Outcome: Progressing Goal: Will remain free from infection Outcome: Progressing Goal: Diagnostic test results will improve Outcome: Progressing Goal: Respiratory complications  will improve Outcome: Progressing Goal: Cardiovascular complication will be avoided Outcome: Progressing   Problem: Activity: Goal: Risk for activity intolerance will decrease Outcome: Progressing   Problem: Nutrition: Goal: Adequate nutrition will be maintained Outcome: Progressing   Problem: Coping: Goal: Level of anxiety will decrease Outcome: Progressing   Problem: Elimination: Goal: Will not experience complications related to bowel motility Outcome: Progressing Goal: Will not experience complications related to urinary retention Outcome: Progressing   Problem: Pain Managment: Goal: General experience of comfort will improve and/or be controlled Outcome: Progressing   Problem: Safety: Goal: Ability to remain free from injury will improve Outcome: Progressing   Problem: Skin Integrity: Goal: Risk for impaired skin integrity will decrease Outcome: Progressing

## 2024-06-18 NOTE — Evaluation (Signed)
 Clinical/Bedside Swallow Evaluation Patient Details  Name: Justin Salinas MRN: [id number] Date of Birth: 09-14-1940  Today's Date: 06/18/2024 Time: SLP Start Time (ACUTE ONLY): 9083 SLP Stop Time (ACUTE ONLY): 9062 SLP Time Calculation (min) (ACUTE ONLY): 21 min  Past Medical History:  Past Medical History:  Diagnosis Date   Allergy    Anemia    Asthma    Atherosclerosis of abdominal aorta 02/26/2020   Seen on x-ray L-spine September 2021   BPH (benign prostatic hyperplasia)    CAD (coronary artery disease)    a. s/p MI 09/1993;  b. known CTO of RCA;  c. 06/2014 Cath: LM short/mod dzs, LAD 95ost, LCX nl, RI nl, RCA 100, EF 55%;  c. 06/2014 s/p CABG x 4: LIMA->LAD, VG->D1, VG->OM1, VG->Acute Marginal.   Carotid stenosis    a. Carotid US  1/17:  RICA 40-59%; LICA 60-79% // Carotid US  1/22: R 1-39; L 40-59; L VA aberrant flow; bilat subclavian stenosis // Carotid US  1/23: R 1-39, L 40-59; R subclavian stenosis   CKD (chronic kidney disease), stage III (HCC)    Constipation    in the past 2-3 weeks   Diabetes (HCC)    AODM   Diastolic dysfunction    a. 05/2015 Echo: EF 60-65%, LVH, Gr 1 DD, mild MR, mod dil RA/LA, mod TR, PASP .   Essential hypertension    GERD (gastroesophageal reflux disease)    History of kidney stones    Hyperlipidemia    Past Surgical History:  Past Surgical History:  Procedure Laterality Date   ANGIOPLASTY  09/22/1993   POBA of RCA (Dr. FABIENE Salinas)   BACK SURGERY  2000   back fusion - Dr. Victory Salinas   CARDIAC CATHETERIZATION  11/28/1998   silent occlusion of RCA w/collaterals from left coronary system, prox LAD w/40-50% eccentric narrowing, 1st diagonal with 70-80% eccentric narrowing, 85% narrowing of prox small OM1 (Dr. FABIENE Salinas)   CAROTID DOPPLER  07/2011   left subclavian (50-69%); right bulb (0-49%); RICA (normal); left mid-distal CCA (0-49%); left bulb/prox ICA (50-69%); left vertebral with abnormal antegrade flow   CORONARY ARTERY BYPASS  GRAFT N/A 05/21/2014   Procedure: CORONARY ARTERY BYPASS GRAFTING (CABG) x  four, using left internal mammary artery and right leg greater saphenous vein harvested endoscopically;  Surgeon: Justin JAYSON Millers, MD;  Location: [facility] OR;  Service: Open Heart Surgery;  Laterality: N/A;   INTRAOPERATIVE TRANSESOPHAGEAL ECHOCARDIOGRAM N/A 05/21/2014   Procedure: INTRAOPERATIVE TRANSESOPHAGEAL ECHOCARDIOGRAM;  Surgeon: Justin JAYSON Millers, MD;  Location: [facility] OR;  Service: Open Heart Surgery;  Laterality: N/A;   LEFT HEART CATHETERIZATION WITH CORONARY ANGIOGRAM N/A 05/19/2014   Procedure: LEFT HEART CATHETERIZATION WITH CORONARY ANGIOGRAM;  Surgeon: Justin BIRCH Fell, MD;  Location: [facility] CATH LAB;  Service: Cardiovascular;  Laterality: N/A;   LUMBAR LAMINECTOMY/DECOMPRESSION MICRODISCECTOMY Left 09/23/2020   Procedure: Laminectomy and Foraminotomy - left - Lumbar three-Lumbar four;  Surgeon: Salinas Victory, MD;  Location: [facility];  Service: Neurosurgery;  Laterality: Left;   NASAL SINUS SURGERY  2010   NM MYOCAR PERF WALL MOTION  09/19/2009   bruce myoview - mild perfusion defect in basal inferior region (infarct/scar), EF 60%, low risk scan   RENAL DOPPLER  10/2011   SMA w/ 70-99% diameter reduction & high grade stenosis; R&L renals w/narrowing and increased velocities (60-99%), R kidney smaller than L   TRANSTHORACIC ECHOCARDIOGRAM  10/20/2012   EF 55-60%, moderate concentric hypertrophy, ventricular septum thickness increased, calcified MV annulus   VASECTOMY  1963  HPI:  Justin Salinas is a 84 y.o. male residing in assisted living facility who presented 1/14 with acute agitation, altered mental status, reported seizure-like activity. CXR 1/15 with no active disease.  Head CT 1/15: Stable small right peripheral subdural hematoma, measuring approximately 4  mm, without mass effect, unchanged from 06/17/2024.  The patient was recently hospitalized from 1/10-1/05/2025 for acute encephalopathy felt to be  multifactorial, including E. coli UTI, fever, and metabolic derangements. During that admission, he was noted to have tremor and cogwheel rigidity, raising concern for extrapyramidal symptoms.  Pt with past medical history of Mancia, CAD s/p CABG, hypertension, type 2 diabetes, CKD stage III, chronic diastolic heart failure.    Assessment / Plan / Recommendation  Clinical Impression  Pt presents with a mild oral dysphagia which is suspected to be impacted by cognition.  SLP provided oral care prior to administration of PO trials.  Pt with thick dried secretions in oral cavity on arrival.  Following oral care, pt coughed and expectorated with oral suction a moderate amount of tan secretions. Oral holding of thin liquid noted on initial trial, with suction used to clear. Pt exhibited wet throat clear x1 with thin liquid, but tolerated all additional trials, including serial straw sips. Pt with prolonged oral phase and R sided residue/pocketing with regular, soft, and simulated ground textures.  Oral clearance was most complete and efficient with puree texture.  Historic head imaging (2024) with remote L temporal lobe and L cerebellar infarcts. SLP to follow for diet tolerance and possible upgrade with improvement in mentation.     Recommend puree diet with thin liquids.   SLP Visit Diagnosis: Dysphagia, oral phase (R13.11)    Aspiration Risk  Mild aspiration risk    Diet Recommendation Dysphagia 1 (Puree);Thin liquid    Liquid Administration via: Cup;Straw Medication Administration: Crushed with puree Supervision: Staff to assist with self feeding (1:1 assistance at present) Compensations: Small sips/bites;Slow rate;Check R side for oral residue/pocketing;Feed when awake/alert Postural Changes: Seated upright at 90 degrees    Other Recommendations Oral Care Recommendations: Oral care QID     Swallow Evaluation Recommendations  See above   Assistance Recommended at Discharge  N/A  Functional  Status Assessment Patient has had a recent decline in their functional status and demonstrates the ability to make significant improvements in function in a reasonable and predictable amount of time.  Frequency and Duration min 2x/week  2 weeks       Prognosis Prognosis for improved oropharyngeal function: Fair Barriers to Reach Goals: Cognitive deficits      Swallow Study   General Date of Onset: 06/17/24 HPI: Justin Salinas is a 84 y.o. male residing in assisted living facility who presented 1/14 with acute agitation, altered mental status, reported seizure-like activity. CXR 1/15 with no active disease.  Head CT 1/15: Stable small right peripheral subdural hematoma, measuring approximately 4  mm, without mass effect, unchanged from 06/17/2024.  The patient was recently hospitalized from 1/10-1/05/2025 for acute encephalopathy felt to be multifactorial, including E. coli UTI, fever, and metabolic derangements. During that admission, he was noted to have tremor and cogwheel rigidity, raising concern for extrapyramidal symptoms.  Pt with past medical history of Mancia, CAD s/p CABG, hypertension, type 2 diabetes, CKD stage III, chronic diastolic heart failure. Type of Study: Bedside Swallow Evaluation Previous Swallow Assessment: none Diet Prior to this Study: NPO Temperature Spikes Noted: No Respiratory Status: Room air History of Recent Intubation: No Behavior/Cognition: Alert;Confused;Requires cueing Oral Cavity  Assessment: Dry;Dried secretions Oral Care Completed by SLP: Yes Oral Cavity - Dentition: Adequate natural dentition;Missing dentition Self-Feeding Abilities: Total assist Patient Positioning: Upright in bed Baseline Vocal Quality: Hoarse Volitional Cough: Cognitively unable to elicit Volitional Swallow: Unable to elicit    Oral/Motor/Sensory Function Overall Oral Motor/Sensory Function: Mild impairment Facial ROM: Within Functional Limits Facial Symmetry: Abnormal  symmetry right Lingual ROM:  (unable to assess) Lingual Symmetry: Within Functional Limits Lingual Strength:  (unable to assess) Velum: Within Functional Limits Mandible: Within Functional Limits   Ice Chips Ice chips: Not tested   Thin Liquid Thin Liquid: Impaired Presentation: Straw Oral Phase Functional Implications: Oral holding Pharyngeal  Phase Impairments: Throat Clearing - Immediate    Nectar Thick Nectar Thick Liquid: Not tested   Honey Thick Honey Thick Liquid: Not tested   Puree Puree: Within functional limits Presentation: Spoon   Solid     Solid: Impaired Oral Phase Impairments: Impaired mastication Oral Phase Functional Implications: Right lateral sulci pocketing;Oral residue      Anette FORBES Grippe, MA, CCC-SLP Acute Rehabilitation Services Office: [phone] 06/18/2024,9:54 AM

## 2024-06-18 NOTE — ED Notes (Signed)
 Pt daughter came out of room to ask for assistance with her father. She stated pt has been trying to get out of bed, not follow commands, and pulling off monitoring equipment. MD was made aware.

## 2024-06-18 NOTE — ED Notes (Signed)
 Dr. Azadegan was made aware that pt calmed down some from the ativan  given earlier but pt is still restless and will not keep monitoring equipment on.

## 2024-06-18 NOTE — Progress Notes (Signed)
 About to attempt to complete admission, but noticed pt was in mitten restraints and sleeping. May attempt to contact daughter via phone to complete admission questions.  Markey Deady BSN RN

## 2024-06-18 NOTE — ED Notes (Signed)
 Order for Zyprexa  obtain for agitation.

## 2024-06-18 NOTE — Progress Notes (Signed)
 Discussed with daughter 06/19/2023 about patient's condition and goals moving forward. Patients daughter stated that she would not be able to come up tonight but wants her dad to get good rest if possible. Explained the possible causes of his presentation including medication induced from UTI treatment, medication induced from removal of anti-psychotic, seizures, SDH, progressive dementia. Explained how the [facility] may resorb on its own and surgery is not indicated at this time, we are limiting other altering medications as much as possible, and he is also on medication treatment for seizures. Discussed with neurology regarding risperidone , Seroquel , and other possible harmful/helpful medications for this patient's current presentation. See recommendations below. After speaking to patient's daughter regarding goals of care with decline in father's cognition/independence over the past 8 weeks, patient's daughter interested in palliative consultation. Blood pressures also elevated  with SBP >160. Home medications added. Goal <160.   Baseline per family prior to hospitalization: AO to person and sometimes place/situation. pleasantly confused. at assisted living he was able to perform most ADLs with minimal assistance. Has history of hospital acquired delirium, agitation, and behavioral disturbances.   - palliative consult placed  - appreciate neurology recommendations - Seroquel  25 mg daily at night added - melatonin 3 mg  - continue delirium precautions  - consider starting Cinamet while hospitalized 25-100 mg tab TID with understanding this may worsen hallucinations if having any.  - amlodipine  10 mg ordered - Spironolactone  25 mg daily ordered - hydralazine  25 mg q 8 hours PRN ordered

## 2024-06-18 NOTE — Evaluation (Signed)
 Physical Therapy Evaluation Patient Details Name: Justin Salinas MRN: [id number] DOB: 07-Dec-1940 Today's Date: 06/18/2024  History of Present Illness  84 y.o. male presents to [facility] 06/17/24 from ALF with acute agitation, AMS, and reported seizure-like activity. Two prior falls at facility. CT head showed acute 4mm R parafalcine SDH w/o midline shift.  Prior admit 1/10 with acute encephalopathy. PMH: CAD s/p CABG x4, DMII, HTN, BPH, lumbar radiculopathy, chronic diastolic heart failure, dementia.   Clinical Impression  Hx taken from chart review as pt was lethargic and unable to answer questions. Pt resides at ALF where he would ambulate independently in the room with use of RW. Staff supervision for longer distances and for bathing/dressing. Pt was confused during the eval with restless movement of BLE's. Able to move to EOB with ModA with pt then promptly returning to supine. Hopeful for return to ALF with HHPT if pt is able to progress mobility. Will continue to follow acutely.    Supine BP- 128/124 (130), 70 BPM         If plan is discharge home, recommend the following: A lot of help with walking and/or transfers;A lot of help with bathing/dressing/bathroom;Assistance with cooking/housework;Direct supervision/assist for medications management;Direct supervision/assist for financial management;Assist for transportation;Help with stairs or ramp for entrance   Can travel by private vehicle    No    Equipment Recommendations None recommended by PT     Functional Status Assessment Patient has had a recent decline in their functional status and demonstrates the ability to make significant improvements in function in a reasonable and predictable amount of time.     Precautions / Restrictions Precautions Precautions: Fall Recall of Precautions/Restrictions: Impaired Precaution/Restrictions Comments: SBP <160 Restrictions Weight Bearing Restrictions Per Provider Order: No       Mobility  Bed Mobility Overal bed mobility: Needs Assistance Bed Mobility: Supine to Sit, Sit to Supine    Supine to sit: Mod assist, +2 for physical assistance Sit to supine: Min assist   General bed mobility comments: restless movement of BLE's with slight ability to bring towards edge of stretcher, ModAx2 to raise trunk and complete movement. Once seated on EOB pt promptly returning to supine with MinA for RLE management       Balance Overall balance assessment: Needs assistance, History of Falls Sitting-balance support: Bilateral upper extremity supported, Feet unsupported Sitting balance-Leahy Scale: Poor Sitting balance - Comments: MinA when in briefly in seated position. Returned quickly to supine       Pertinent Vitals/Pain Pain Assessment Pain Assessment: No/denies pain    Home Living Family/patient expects to be discharged to:: Assisted living [facility])    Home Equipment: [place] - single point;Shower seat;Hand held shower head;Grab bars - toilet;Grab bars - tub/shower;Rolling Walker (2 wheels) Additional Comments: has assistance for all IADL's; pt reports normally able to take himself to restroom and this is consistent with chart    Prior Function Prior Level of Function : Patient poor historian/Family not available    Mobility Comments: Per chart review, pt uses a walker; unsure accuracy of report ADLs Comments: Per chart review, pt able to take self to restroom; inconsistent report with regard to other ADL     Extremity/Trunk Assessment   Upper Extremity Assessment Upper Extremity Assessment: Defer to OT evaluation    Lower Extremity Assessment Lower Extremity Assessment: Generalized weakness    Cervical / Trunk Assessment Cervical / Trunk Assessment: Kyphotic  Communication   Communication Communication: Impaired Factors Affecting Communication: Hearing impaired  Cognition Arousal: Lethargic Behavior During Therapy: Restless   PT -  Cognitive impairments: History of cognitive impairments, Orientation, Awareness, Memory, Attention, Sequencing, Problem solving, Safety/Judgement   Orientation impairments: Place, Time, Situation, Person    PT - Cognition Comments: Lethargic with pt primarily keeping eyes closed. Limited following of commands Following commands: Impaired Following commands impaired: Follows one step commands inconsistently, Follows one step commands with increased time     Cueing Cueing Techniques: Verbal cues, Gestural cues, Tactile cues      PT Assessment Patient needs continued PT services  PT Problem List Decreased strength;Decreased activity tolerance;Decreased balance;Decreased mobility;Decreased cognition;Decreased knowledge of use of DME;Decreased safety awareness       PT Treatment Interventions DME instruction;Gait training;Stair training;Therapeutic activities;Functional mobility training;Therapeutic exercise;Balance training;Neuromuscular re-education;Patient/family education    PT Goals (Current goals can be found in the Care Plan section)  Acute Rehab PT Goals Patient Stated Goal: unable to state goal PT Goal Formulation: With patient Time For Goal Achievement: 07/02/24 Potential to Achieve Goals: Good    Frequency Min 2X/week     Co-evaluation   Reason for Co-Treatment: Necessary to address cognition/behavior during functional activity;For patient/therapist safety;To address functional/ADL transfers PT goals addressed during session: Mobility/safety with mobility;Balance         AM-PAC PT 6 Clicks Mobility  Outcome Measure Help needed turning from your back to your side while in a flat bed without using bedrails?: A Lot Help needed moving from lying on your back to sitting on the side of a flat bed without using bedrails?: A Lot Help needed moving to and from a bed to a chair (including a wheelchair)?: A Lot Help needed standing up from a chair using your arms (e.g.,  wheelchair or bedside chair)?: A Lot Help needed to walk in hospital room?: A Lot Help needed climbing 3-5 steps with a railing? : Total 6 Click Score: 11    End of Session   Activity Tolerance: Other (comment) (limited by cognition) Patient left: in bed;with call bell/phone within reach Nurse Communication: Mobility status PT Visit Diagnosis: Unsteadiness on feet (R26.81);Other abnormalities of gait and mobility (R26.89);Muscle weakness (generalized) (M62.81);History of falling (Z91.81)    Time: 1030-1045 PT Time Calculation (min) (ACUTE ONLY): 15 min   Charges:   PT Evaluation $PT Eval Moderate Complexity: 1 Mod   PT General Charges $$ ACUTE PT VISIT: 1 Visit    Kate ORN, PT, DPT Secure Chat Preferred  Rehab Office [phone]   Kate BRAVO Wendolyn 06/18/2024, 12:09 PM

## 2024-06-18 NOTE — Evaluation (Signed)
 Occupational Therapy Evaluation Patient Details Name: Justin Salinas MRN: [id number] DOB: 10-09-1940 Today's Date: 06/18/2024   History of Present Illness   84 y.o. male presents to [facility] 06/17/24 from ALF with acute agitation, AMS, and reported seizure-like activity. Two prior falls at facility. CT head showed acute 4mm R parafalcine SDH w/o midline shift.  Prior admit 1/10 with acute encephalopathy. PMH: CAD s/p CABG x4, DMII, HTN, BPH, lumbar radiculopathy, chronic diastolic heart failure, dementia.     Clinical Impressions Pt typically was able to self feed and toilet himself at his ALF, he was otherwise assisted for ADLs and IADLs. Pt was walking with a RW. PLOF from chart history. Pt presents with lethargy and limited ability to participate in evaluation. Pt is currently dependent in all ADLs and requires +2 assist to achieve sitting EOB momentarily before initiating returning to supine. Will follow acutely with hope that pt will return to prior functional status as his level of alertness improves and he can return to the familiar environment of his ALF with HHOT.      If plan is discharge home, recommend the following:   Two people to help with walking and/or transfers;A lot of help with bathing/dressing/bathroom;Assistance with feeding;Assistance with cooking/housework;Direct supervision/assist for medications management;Direct supervision/assist for financial management;Assist for transportation;Help with stairs or ramp for entrance     Functional Status Assessment   Patient has had a recent decline in their functional status and/or demonstrates limited ability to make significant improvements in function in a reasonable and predictable amount of time     Equipment Recommendations   None recommended by OT     Recommendations for Other Services         Precautions/Restrictions   Precautions Precautions: Fall Recall of Precautions/Restrictions:  Impaired Precaution/Restrictions Comments: SBP <160 Restrictions Weight Bearing Restrictions Per Provider Order: No     Mobility Bed Mobility Overal bed mobility: Needs Assistance Bed Mobility: Supine to Sit, Sit to Supine     Supine to sit: Mod assist, +2 for physical assistance Sit to supine: Min assist   General bed mobility comments: pt not following commands to assist with coming to EOB, needing multimodal cues to initiate, assist to raise trunk and for LEs back into bed, pt with decreased tolerance of sitting, almost immediately attempting to retun to supine    Transfers                   General transfer comment: deferred due to lethargy      Balance Overall balance assessment: Needs assistance, History of Falls Sitting-balance support: Bilateral upper extremity supported, Feet unsupported Sitting balance-Leahy Scale: Poor Sitting balance - Comments: MinA when in briefly in seated position. Returned quickly to supine                                   ADL either performed or assessed with clinical judgement   ADL                                         General ADL Comments: dependent in all ADLs due to lethargy     Vision Baseline Vision/History: 1 Wears glasses Ability to See in Adequate Light: 0 Adequate Additional Comments: glasses not at hospital     Perception Perception: Not tested  Praxis Praxis: Not tested       Pertinent Vitals/Pain Pain Assessment Pain Assessment: No/denies pain     Extremity/Trunk Assessment Upper Extremity Assessment Upper Extremity Assessment: Generalized weakness   Lower Extremity Assessment Lower Extremity Assessment: Defer to PT evaluation   Cervical / Trunk Assessment Cervical / Trunk Assessment: Kyphotic (weakness)   Communication Communication Communication: Impaired Factors Affecting Communication: Hearing impaired   Cognition Arousal: Lethargic Behavior During  Therapy: Restless Cognition: Cognition impaired   Orientation impairments: Situation, Time, Place Awareness: Intellectual awareness impaired, Online awareness impaired Memory impairment (select all impairments): Short-term memory, Working civil service fast streamer, Non-declarative long-term memory, Geneticist, Molecular long-term memory Attention impairment (select first level of impairment): Focused attention Executive functioning impairment (select all impairments): Initiation, Sequencing, Problem solving OT - Cognition Comments: pt with lethargy                 Following commands: Impaired Following commands impaired: Follows one step commands inconsistently, Follows one step commands with increased time     Cueing  General Comments   Cueing Techniques: Verbal cues;Gestural cues;Tactile cues      Exercises     Shoulder Instructions      Home Living Family/patient expects to be discharged to:: Assisted living                             Home Equipment: [place] - single point;Shower seat;Hand held shower head;Grab bars - toilet;Grab bars - tub/shower;Rolling Walker (2 wheels)   Additional Comments: pt unable to state, per chart review, able to self feed and toilet at baseline, assisted for IADLs      Prior Functioning/Environment Prior Level of Function : Patient poor historian/Family not available             Mobility Comments: Per chart review, pt uses a walker; unsure accuracy of report ADLs Comments: Per chart review, pt able to take self to restroom; inconsistent report with regard to other ADL    OT Problem List: Decreased strength;Decreased activity tolerance;Impaired balance (sitting and/or standing);Decreased cognition;Decreased knowledge of use of DME or AE;Decreased safety awareness   OT Treatment/Interventions: Self-care/ADL training;Therapeutic exercise;DME and/or AE instruction;Therapeutic activities;Balance training;Patient/family education;Cognitive  remediation/compensation      OT Goals(Current goals can be found in the care plan section)   Acute Rehab OT Goals OT Goal Formulation: Patient unable to participate in goal setting Time For Goal Achievement: 07/02/24 Potential to Achieve Goals: Fair ADL Goals Pt Will Perform Eating: with set-up;sitting Pt Will Perform Grooming: with contact guard assist;standing Pt Will Transfer to Toilet: with contact guard assist;ambulating;bedside commode Additional ADL Goal #1: Pt will complete bed mobility with min assist in preparation for ADLs.   OT Frequency:  Min 1X/week    Co-evaluation PT/OT/SLP Co-Evaluation/Treatment: Yes Reason for Co-Treatment: Necessary to address cognition/behavior during functional activity;For patient/therapist safety;To address functional/ADL transfers PT goals addressed during session: Mobility/safety with mobility;Balance OT goals addressed during session: ADL's and self-care      AM-PAC OT 6 Clicks Daily Activity     Outcome Measure Help from another person eating meals?: Total Help from another person taking care of personal grooming?: Total Help from another person toileting, which includes using toliet, bedpan, or urinal?: Total Help from another person bathing (including washing, rinsing, drying)?: Total Help from another person to put on and taking off regular upper body clothing?: Total Help from another person to put on and taking off regular lower body clothing?: Total 6 Click Score:  6   End of Session    Activity Tolerance: Patient tolerated treatment well Patient left: in bed;with call bell/phone within reach;with restraints reapplied  OT Visit Diagnosis: Unsteadiness on feet (R26.81);Muscle weakness (generalized) (M62.81);Other symptoms and signs involving cognitive function                Time: 1031-1045 OT Time Calculation (min): 14 min Charges:  OT General Charges $OT Visit: 1 Visit OT Evaluation $OT Eval Moderate Complexity: 1  Mod  Mliss HERO, OTR/L Acute Rehabilitation Services Office: [phone]   Kennth Mliss Helling 06/18/2024, 12:45 PM

## 2024-06-18 NOTE — TOC CAGE-AID Note (Signed)
 Transition of Care [facility]) - CAGE-AID Screening   Patient Details  Name: Justin Salinas MRN: [id number] Date of Birth: 09/28/1940  [facility] CM/SW Contact:    Carrington Mullenax E Adon Gehlhausen, LCSW Phone Number: 06/18/2024, 3:42 PM   Clinical Narrative: Disoriented.   CAGE-AID Screening: Substance Abuse Screening unable to be completed due to: : Patient unable to participate

## 2024-06-19 DIAGNOSIS — I5030 Unspecified diastolic (congestive) heart failure: Secondary | ICD-10-CM | POA: Diagnosis not present

## 2024-06-19 DIAGNOSIS — G9340 Encephalopathy, unspecified: Secondary | ICD-10-CM | POA: Diagnosis not present

## 2024-06-19 DIAGNOSIS — S065XAA Traumatic subdural hemorrhage with loss of consciousness status unknown, initial encounter: Secondary | ICD-10-CM | POA: Diagnosis not present

## 2024-06-19 DIAGNOSIS — I130 Hypertensive heart and chronic kidney disease with heart failure and stage 1 through stage 4 chronic kidney disease, or unspecified chronic kidney disease: Secondary | ICD-10-CM | POA: Diagnosis not present

## 2024-06-19 LAB — GLUCOSE, CAPILLARY
Glucose-Capillary: 144 mg/dL — ABNORMAL HIGH (ref 70–99)
Glucose-Capillary: 180 mg/dL — ABNORMAL HIGH (ref 70–99)
Glucose-Capillary: 210 mg/dL — ABNORMAL HIGH (ref 70–99)
Glucose-Capillary: 235 mg/dL — ABNORMAL HIGH (ref 70–99)

## 2024-06-19 LAB — CBC
HCT: 36.6 % — ABNORMAL LOW (ref 39.0–52.0)
Hemoglobin: 12.1 g/dL — ABNORMAL LOW (ref 13.0–17.0)
MCH: 31.8 pg (ref 26.0–34.0)
MCHC: 33.1 g/dL (ref 30.0–36.0)
MCV: 96.3 fL (ref 80.0–100.0)
Platelets: 196 K/uL (ref 150–400)
RBC: 3.8 MIL/uL — ABNORMAL LOW (ref 4.22–5.81)
RDW: 12.8 % (ref 11.5–15.5)
WBC: 7.5 K/uL (ref 4.0–10.5)
nRBC: 0 % (ref 0.0–0.2)

## 2024-06-19 LAB — BASIC METABOLIC PANEL WITH GFR
Anion gap: 10 (ref 5–15)
BUN: 34 mg/dL — ABNORMAL HIGH (ref 8–23)
CO2: 26 mmol/L (ref 22–32)
Calcium: 9.3 mg/dL (ref 8.9–10.3)
Chloride: 104 mmol/L (ref 98–111)
Creatinine, Ser: 1.48 mg/dL — ABNORMAL HIGH (ref 0.61–1.24)
GFR, Estimated: 46 mL/min — ABNORMAL LOW
Glucose, Bld: 140 mg/dL — ABNORMAL HIGH (ref 70–99)
Potassium: 4.2 mmol/L (ref 3.5–5.1)
Sodium: 139 mmol/L (ref 135–145)

## 2024-06-19 NOTE — Care Management Obs Status (Signed)
 MEDICARE OBSERVATION STATUS NOTIFICATION   Patient Details  Name: Justin Salinas MRN: [id number] Date of Birth: [birth year]   Medicare Observation Status Notification Given:  Yes    Andrez JULIANNA George, RN 06/19/2024, 8:57 AM

## 2024-06-19 NOTE — Plan of Care (Signed)
   Problem: Coping: Goal: Ability to adjust to condition or change in health will improve Outcome: Progressing

## 2024-06-19 NOTE — Progress Notes (Signed)
 "  HD#0 SUBJECTIVE:  Patient Summary: Justin Salinas is a 84 y.o. male residing in assisted living facility, with past medical history of [place], CAD s/p CABG, hypertension, type 2 diabetes, CKD stage III, chronic diastolic heart failure who presents with acute agitation, altered mental status, reported seizure-like activity and admitted for encephalopathy workup and SDH.   Recent discharge and treatment for UTI 1/12.  Changes to home medication regiment at last hospitalization: previously on risperidone  for 6 months but was discontinued due to concerns for NMS and cogwheel rigidity. After discharge had 2 episodes of fall, one episode on 1/13 reported from staff at assisted living facility. This was day Seroquel  was started. On 1/14 is had another fall and staff reported seizure like activities. Denied head trauma x2. ED CT scan showed 4 mm SDH. UDS negative. TSH, B1: NL in prior admission.   Overnight Events: no overnight events.   Interim History: Saw patient at bedside. Sleeping upon entry to patient's room. He is oriented to self and time, but not place (when given options, for example correctly stated January when asked which month we were in with options November and January). He is sleepy during exam but responds appropriately to commands. He denies any pain.   OBJECTIVE:  Vital Signs: Vitals:   06/19/24 0512 06/19/24 0513 06/19/24 0514 06/19/24 0515  BP:      Pulse:      Resp: 18 (!) 21 16 (!) 23  Temp:      TempSrc:      SpO2:       Supplemental O2: Room Air SpO2: 96 %  There were no vitals filed for this visit.  No intake or output data in the 24 hours ending 06/19/24 0634 Net IO Since Admission: No IO data has been entered for this period [06/19/24 0634]  Physical Exam: Physical Exam Constitutional:      General: He is not in acute distress.    Appearance: He is not ill-appearing or toxic-appearing.  Eyes:     Pupils: Pupils are equal, round, and reactive to light.   Cardiovascular:     Rate and Rhythm: Normal rate and regular rhythm.     Pulses: Normal pulses.     Heart sounds: No murmur heard.    No friction rub. No gallop.  Pulmonary:     Effort: Pulmonary effort is normal.     Breath sounds: Normal breath sounds. No stridor. No wheezing, rhonchi or rales.  Abdominal:     General: Bowel sounds are normal.     Palpations: Abdomen is soft.     Tenderness: There is no guarding.  Musculoskeletal:     Right lower leg: No edema.     Left lower leg: No edema.  Skin:    General: Skin is warm and dry.  Neurological:     Mental Status: He is alert. He is disoriented.     Comments: Alert, not oriented.  Patient was getting EEG, mittens on.  Moving all extremities.  Following commands.     Patient Lines/Drains/Airways Status     Active Line/Drains/Airways     Name Placement date Placement time Site Days   Peripheral IV 06/17/24 16 G 1 Anterior;Distal;Left;Upper Arm 06/17/24  1813  Arm  2   External Urinary Catheter 06/18/24  1744  --  1            Pertinent labs and imaging:      Latest Ref Rng & Units 06/19/2024  2:31 AM 06/18/2024    2:39 AM 06/17/2024    6:30 PM  CBC  WBC 4.0 - 10.5 K/uL 7.5  7.5  8.2   Hemoglobin 13.0 - 17.0 g/dL 87.8  88.5  88.5   Hematocrit 39.0 - 52.0 % 36.6  35.2  34.4   Platelets 150 - 400 K/uL 196  178  163        Latest Ref Rng & Units 06/19/2024    2:31 AM 06/18/2024    2:39 AM 06/17/2024    6:30 PM  CMP  Glucose 70 - 99 mg/dL 859  808  791   BUN 8 - 23 mg/dL 34  44  49   Creatinine 0.61 - 1.24 mg/dL 8.51  8.58  8.34   Sodium 135 - 145 mmol/L 139  137  136   Potassium 3.5 - 5.1 mmol/L 4.2  3.9  4.8   Chloride 98 - 111 mmol/L 104  102  102   CO2 22 - 32 mmol/L 26  25  23    Calcium  8.9 - 10.3 mg/dL 9.3  9.4  9.3   Total Protein 6.5 - 8.1 g/dL  6.7  6.7   Total Bilirubin 0.0 - 1.2 mg/dL  0.4  0.3   Alkaline Phos 38 - 126 U/L  76  84   AST 15 - 41 U/L  25  24   ALT 0 - 44 U/L  20  19     EEG  adult Result Date: 06/18/2024 Shelton Arlin KIDD, MD     06/18/2024  8:55 AM Patient Name: Roma Bierlein Yontz MRN: [id number] Epilepsy Attending: Arlin KIDD Shelton Referring Physician/Provider: Ruthe Cornet, DO Date: 06/18/2024 Duration: 21.16 mins Patient history:  84 y.o. presenting 1/14 after shower event concerning for syncope vs seizure with agitation and CT showing small acute 4 mm right parafalcine SDH (mBIG 1) without mass effect. EEG to evaluate for seizure Level of alertness: Awake AEDs during EEG study: LEV Technical aspects: This EEG study was done with scalp electrodes positioned according to the 10-20 International system of electrode placement. Electrical activity was reviewed with band pass filter of 1-70Hz , sensitivity of 7 uV/mm, display speed of 43mm/sec with a 60Hz  notched filter applied as appropriate. EEG data were recorded continuously and digitally stored.  Video monitoring was available and reviewed as appropriate. Description: EEG showed continuous generalized 3 to 6 Hz theta-delta slowing. Hyperventilation and photic stimulation were not performed.   Of note study was technically difficult due to significant myogenic and electrode artifact ABNORMALITY - Continuous slow, generalized IMPRESSION: This technically difficult study is suggestive of generalized cerebral dysfunction (encephalopathy). No seizures or epileptiform discharges were seen throughout the recording. Priyanka KIDD Shelton   CT HEAD WO CONTRAST ( ) Result Date: 06/18/2024 EXAM: CT HEAD WITHOUT CONTRAST 06/18/2024 07:07:00 AM TECHNIQUE: CT of the head was performed without the administration of intravenous contrast. Automated exposure control, iterative reconstruction, and/or weight based adjustment of the mA/kV was utilized to reduce the radiation dose to as low as reasonably achievable. COMPARISON: CT of the head dated 06/17/2024. CLINICAL HISTORY: F/u imaging for SDH seen on CT head 1/14. FINDINGS: BRAIN AND VENTRICLES: A  small right peripheral subdural hematoma is again demonstrated and is unchanged in the interim, again measuring approximately 4 mm. There is no mass effect upon the brain parenchyma. There is moderate generalized volume loss present. There is mild-to-moderate cerebral white matter disease. No acute hemorrhage. No evidence of acute infarct. No hydrocephalus. No midline shift. ORBITS:  The patient is status post bilateral lens replacement. No acute abnormality. SINUSES: There is moderate chronic-appearing mucosal disease within the left maxillary sinus. SOFT TISSUES AND SKULL: No acute soft tissue abnormality. No skull fracture. IMPRESSION: 1. Stable small right peripheral subdural hematoma, measuring approximately 4 mm, without mass effect, unchanged from 06/17/2024. 2. Moderate generalized volume loss and mild-to-moderate cerebral white matter disease. 3. Moderate chronic-appearing mucosal disease within the left maxillary sinus. Electronically signed by: Evalene Coho MD 06/18/2024 07:31 AM EST RP Workstation: [id number]    ASSESSMENT/PLAN:  Assessment: Principal Problem:   Acute encephalopathy Active Problems:   Controlled type 2 diabetes mellitus, with long-term current use of insulin  (HCC)   Essential hypertension   GERD   BPH (benign prostatic hyperplasia)   Hypertension   Recurrent falls   Cognitive decline   Dementia with behavioral disturbance (HCC)   SDH (subdural hematoma) (HCC)   Plan: #Acute Encephalopathy  #Agitation #Acute SDH, 4 mm right parafalcine (mBIG 1) Current encephalopathy differential diagnosis includes: SDH vs dopaminergic rebound with cessation of risperidone  vs seizure versus progressive dementia. Curbside consult with neurology recommended continued cessation of risperidone  given suspicion of parkinsonian features. Recommended continuing Seroquel  25 mg nightly and consider Cinamet inpatient. Palliative care consulted for conversation with daughter. Patient resting  comfortably during examination.  - Delirium bundle: pain/constipation/urinary retention, sleep/wake, reorientation, minimize restraints when possible. - Avoid benzos if possible (Ativan  may worsen delirium/resp depression) - Continue Sertraline  100 mg - Avoid haloperidol /risperidone  if Parkinsonism/LBD suspected. - melatonin nightly  - Neuro checks (q4h initially depending on unit). - Hold aspirin  and anticoagulation  - Maintain SBP <160, avoid hypotension - Fall precautions, PT/OT when stable - Palliative consulted  #Spell: syncope vs seizure vs mechanical fall Mixed story; transient unilateral limpness reported; subdural could be traumatic or incidental but treat as traumatic until proven otherwise.  EEG unremarkable for seizure.  Electrolytes WNL. - Keppra  500 mg BID (dose adjust if renal function worsens). Consider stopping after 7 days.  - Check orthostatics when safe   #Recent UTI (E. coli)  Finished course of antibiotics.  Urinalysis this admission unremarkable.   - Repeat UA/UC if fever/leukocytosis/urinary symptoms or unclear baseline. - Bladder scan/PVR; treat retention if present.  #HTN / CAD / HFpEF Goal SBP <160. Avoid hypotension  - amlodipine  10 mg ordered - Spironolactone  25 mg daily ordered - hydralazine  25 mg q 8 hours PRN ordered  - Avoid hypotension/orthostasis. - Telemetry given syncope concern and CAD history.  #DM2 / RXI6a Stress hyperglycemia contributes to delirium, CKD affects Keppra  dosing and med selection. Cr. 1.48 (1.41 1/15).  - Sliding Scale Insulin  - Hold home meds - Trend Cr; renally dose meds.  # VTE prophylaxis Small SDH   - Avoid chemical VTE ppx initially. - Mechanical prophylaxis now.  Best Practice: Diet: Dysphagia 1 VTE: SCDs Start: 06/17/24 2159 Code: DNR-limited  Disposition planning: Therapy Recs: Pending, DME: other recommendations Family Contact: daughter, notified.  DISPO: Anticipated discharge pending clinical  improvement to assisted living facility  Signature:  Everet Flagg [facility]  6:34 AM, 06/19/2024  On Call pager [phone]  "

## 2024-06-19 NOTE — Plan of Care (Signed)
  Problem: Education: Goal: Ability to describe self-care measures that may prevent or decrease complications (Diabetes Survival Skills Education) will improve Outcome: Progressing Goal: Individualized Educational Video(s) Outcome: Progressing   Problem: Coping: Goal: Ability to adjust to condition or change in health will improve Outcome: Progressing   Problem: Fluid Volume: Goal: Ability to maintain a balanced intake and output will improve Outcome: Progressing   Problem: Health Behavior/Discharge Planning: Goal: Ability to identify and utilize available resources and services will improve Outcome: Progressing Goal: Ability to manage health-related needs will improve Outcome: Progressing   Problem: Metabolic: Goal: Ability to maintain appropriate glucose levels will improve Outcome: Progressing   Problem: Nutritional: Goal: Maintenance of adequate nutrition will improve Outcome: Progressing Goal: Progress toward achieving an optimal weight will improve Outcome: Progressing   Problem: Skin Integrity: Goal: Risk for impaired skin integrity will decrease Outcome: Progressing   Problem: Tissue Perfusion: Goal: Adequacy of tissue perfusion will improve Outcome: Progressing   Problem: Safety: Goal: Non-violent Restraint(s) Outcome: Progressing   Problem: Education: Goal: Knowledge of General Education information will improve Description: Including pain rating scale, medication(s)/side effects and non-pharmacologic comfort measures Outcome: Progressing   Problem: Health Behavior/Discharge Planning: Goal: Ability to manage health-related needs will improve Outcome: Progressing   Problem: Clinical Measurements: Goal: Ability to maintain clinical measurements within normal limits will improve Outcome: Progressing Goal: Will remain free from infection Outcome: Progressing Goal: Diagnostic test results will improve Outcome: Progressing Goal: Respiratory complications  will improve Outcome: Progressing Goal: Cardiovascular complication will be avoided Outcome: Progressing   Problem: Activity: Goal: Risk for activity intolerance will decrease Outcome: Progressing   Problem: Nutrition: Goal: Adequate nutrition will be maintained Outcome: Progressing   Problem: Coping: Goal: Level of anxiety will decrease Outcome: Progressing   Problem: Elimination: Goal: Will not experience complications related to bowel motility Outcome: Progressing Goal: Will not experience complications related to urinary retention Outcome: Progressing   Problem: Pain Managment: Goal: General experience of comfort will improve and/or be controlled Outcome: Progressing   Problem: Safety: Goal: Ability to remain free from injury will improve Outcome: Progressing   Problem: Skin Integrity: Goal: Risk for impaired skin integrity will decrease Outcome: Progressing

## 2024-06-19 NOTE — TOC Initial Note (Addendum)
 Transition of Care [facility]) - Initial/Assessment Note    Patient Details  Name: Justin Salinas MRN: [id number] Date of Birth: February 13, 1941  Transition of Care [facility]) CM/SW Contact:    Justin [name], LCSW Phone Number: 06/19/2024, 1:29 PM  Clinical Narrative:     Patient is from ALF at [facility], daughter is in agreement with patient to return. CSW contacted [facility] to discuss patient's care needs and confirm they could accept patient back to ALF. [facility] ready to accept patient back to ALF with [facility] when medically stable. CSW to follow.     UPDATE 3:56 PM: CSW received a call back from [facility] that the nursing director would prefer patient transition to SNF upon return, based off of therapy notes. Per palliative care, patient's children will be discussing goals of care over the weekend, as well. [facility] to follow up with CSW on Monday for goals of care and appropriate disposition. CSW attempted to reach daughter, Justin Salinas, to provide update, left a voicemail asking for return call. CSW to follow.           Expected Discharge Plan: Assisted Living Barriers to Discharge: Continued Medical Work up   Patient Goals and CMS Choice Patient states their goals for this hospitalization and ongoing recovery are:: patient unable to participate in goal setting, not fully oriented CMS Medicare.gov Compare Post Acute Care list provided to:: Patient Represenative (must comment) Choice offered to / list presented to : Adult Children Justin Salinas ownership interest in [facility].provided to:: Adult Children    Expected Discharge Plan and Services     Post Acute Care Choice: Home Health Living arrangements for the past 2 months: Assisted Living Facility                                      Prior Living Arrangements/Services Living arrangements for the past 2 months: Assisted Living Facility Lives with:: Facility Resident Patient language and need for  interpreter reviewed:: No Do you feel safe going back to the place where you live?: Yes      Need for Family Participation in Patient Care: Yes (Comment) Care giver support system in place?: Yes (comment)   Criminal Activity/Legal Involvement Pertinent to Current Situation/Hospitalization: No - Comment as needed  Activities of Daily Living      Permission Sought/Granted Permission sought to share information with : Facility Medical Sales Representative, Family Supports Permission granted to share information with : Yes, Verbal Permission Granted  Share Information with NAME: Justin Salinas  Permission granted to share info w AGENCY: [facility]  Permission granted to share info w Relationship: Daughter     Emotional Assessment   Attitude/Demeanor/Rapport: Unable to Assess Affect (typically observed): Unable to Assess Orientation: : Oriented to Self Alcohol / Substance Use: Not Applicable Psych Involvement: No (comment)  Admission diagnosis:  Seizure (HCC) [R56.9] SDH (subdural hematoma) (HCC) [S06.5XAA] Acute encephalopathy [G93.40] Patient Active Problem List   Diagnosis Date Noted   SDH (subdural hematoma) (HCC) 06/18/2024   Acute encephalopathy 06/17/2024   Encephalopathy 06/13/2024   Nystagmus 06/13/2024   UTI (urinary tract infection) 06/13/2024   Acute kidney injury superimposed on stage 4 chronic kidney disease (HCC) 11/23/2023   Chronic diastolic CHF (congestive heart failure) (HCC) 11/23/2023   Macrocytic anemia 11/23/2023   Dementia with behavioral disturbance (HCC) 11/23/2023   AKI (acute kidney injury) 09/25/2023   Hyperglycemia 07/24/2022   Recurrent falls 07/23/2022  Cognitive decline 07/23/2022   Lumbar radiculopathy 09/23/2020   Atherosclerosis of abdominal aorta 02/26/2020   Status post placement of bone anchored hearing aid (BAHA) 05/09/2019   Asymmetric SNHL (sensorineural hearing loss) 04/02/2019   Iron deficiency anemia 10/22/2017   Type 2 diabetes mellitus  with hyperglycemia (HCC) 01/03/2017   PVD (posterior vitreous detachment), left eye 02/16/2016   Vitreous syneresis of right eye 02/16/2016   Diastolic dysfunction    Hypertension    CKD stage 3 due to type 2 diabetes mellitus (HCC)    S/P CABG x 4 05/21/2014   Bilateral dry eyes 01/20/2014   Nuclear cataract, bilateral 12/10/2013   Astigmatism 01/11/2012   Hypermetropia 01/11/2012   Presbyopia 01/11/2012   Essential hypertension 12/01/2007   Carotid artery disease 12/01/2007   BPH (benign prostatic hyperplasia) 12/01/2007   Controlled type 2 diabetes mellitus, with long-term current use of insulin  (HCC) 11/28/2007   CAD (coronary artery disease) 11/28/2007   GERD 11/28/2007   PCP:  Justin Worth HERO, MD Pharmacy:   [facility] - [place], [place] - [address] [place] [address] Phone: [phone] Fax: [phone]  [facility] [address] [place] [address] Phone: [phone] Fax: [phone]     Social Drivers of Health (SDOH) Social History: SDOH Screenings   Food Insecurity: Patient Unable To Answer (06/18/2024)  Housing: Unknown (06/18/2024)  Transportation Needs: Patient Unable To Answer (06/18/2024)  Utilities: Patient Unable To Answer (06/18/2024)  Depression (PHQ2-9): Low Risk (12/11/2022)  Financial Resource Strain: Low Risk (12/11/2022)  Physical Activity: Insufficiently Active (12/11/2022)  Social Connections: Patient Unable To Answer (06/18/2024)  Stress: No Stress Concern Present (12/11/2022)  Tobacco Use: High Risk (06/17/2024)   SDOH Interventions:     Readmission Risk Interventions     No data to display

## 2024-06-19 NOTE — Consult Note (Signed)
 "                                                  Palliative Care Consult Note                                  Date: 06/19/2024   Patient Name: Justin Salinas  DOB: 10/24/1940  MRN: [id number]  Age / Sex: 84 y.o., male  PCP: Kennyth Worth HERO, MD Referring Physician: Eben Reyes BROCKS, MD  Reason for Consultation: Establishing goals of care  Past Medical History:  Diagnosis Date   Allergy    Anemia    Asthma    Atherosclerosis of abdominal aorta 02/26/2020   Seen on x-ray L-spine September 2021   BPH (benign prostatic hyperplasia)    CAD (coronary artery disease)    a. s/p MI 09/1993;  b. known CTO of RCA;  c. 06/2014 Cath: LM short/mod dzs, LAD 95ost, LCX nl, RI nl, RCA 100, EF 55%;  c. 06/2014 s/p CABG x 4: LIMA->LAD, VG->D1, VG->OM1, VG->Acute Marginal.   Carotid stenosis    a. Carotid US  1/17:  RICA 40-59%; LICA 60-79% // Carotid US  1/22: R 1-39; L 40-59; L VA aberrant flow; bilat subclavian stenosis // Carotid US  1/23: R 1-39, L 40-59; R subclavian stenosis   CKD (chronic kidney disease), stage III (HCC)    Constipation    in the past 2-3 weeks   Diabetes (HCC)    AODM   Diastolic dysfunction    a. 05/2015 Echo: EF 60-65%, LVH, Gr 1 DD, mild MR, mod dil RA/LA, mod TR, PASP .   Essential hypertension    GERD (gastroesophageal reflux disease)    History of kidney stones    Hyperlipidemia     Subjective:   This NP Camellia Kays reviewed medical records, received report from team, assessed the patient and then meet at the patient's bedside to discuss diagnosis, prognosis, GOC, EOL wishes disposition and options.  Before meeting with the patient/family, I spent time reviewing the chart notes including admission H&P; neurology note, PT/OT notes, internal medicine resident note from 06/18/2024; medicine resident note, nursing notes from today. I also reviewed vital signs, nursing flowsheets, medication administrations record, labs, and imaging. Labs reviewed include CBC which  shows persistent normal white blood cell count at 7.5, stable/improved hemoglobin at 12.1, stable creatinine within baseline at 1.48 in the setting of encephalopathy, acute illness.  I met with the patient at the bedside, no family was present.  The patient was sleeping but awakens to voice and touch, appropriately answering questions but not having significant interactive conversations.  After seeing the patient I called and spoke with his daughter [name].   We meet to discuss diagnosis prognosis, GOC, EOL wishes, disposition and options. Concept of Palliative Care was introduced as specialized medical care for people and their families living with serious illness.  If focuses on providing relief from the symptoms and stress of a serious illness.  The goal is to improve quality of life for both the patient and the family. Values and goals of care important to patient and family were attempted to be elicited.  Created space and opportunity for patient  and family to explore thoughts and feelings regarding current medical situation   Natural trajectory  and current clinical status were discussed. Questions and concerns addressed. Patient  encouraged to call with questions or concerns.    Patient/Family Understanding of Illness: His daughter states that she has noted ongoing decline recently with worsening memory, increased weakness and unsteadiness, becoming more withdrawn.  She notes he does have better and worse days.  She feels that overall his dog and communication are significantly declining.  She notes increasing falls because of his unsteady gait and weakness and decreased activity especially in the last 6 months.  He used to previously enjoyed being very active but now barely makes 1 trip to the nursing station.  She has he is here because he had a fall with a brain bleed that is 4 mm on 1 side of his head.  She she shares that the medical team told her they are not exactly sure what is causing his  behaviors now such as agitation, but they think it could be some contribution of the brain bleed, medication changes.  We spent time talking about the clinical details of his chronic history, overall decline in the recent timeframe as well as his acute presentation.  We specially spent a lot of time talking about dementia and the rapid decline that can occur with acute illness.  Life Review: [facility], has a daughter [name] and a son Cathlyn.  Baseline Status: At baseline he lives at [facility], is normally oriented.  He previously was quite active there, but recently has had decreased activity, increased weakness and is becoming more withdrawn with worse memory.  However, he is normally oriented.  Today's Discussion: In addition to discussions described above we had extensive discussion of various topics.  We spent a lot of time talking about his recent decline prior to his acute illness.  She notes that he had a substantial change when he was admitted for pneumonia last year, never really bounced back.  It is at this time that she feels she started to decline overall.  We spent a lot of time talking about the difficulty in bouncing back from acute illness specially with underlying chronic illnesses.  We also spent time talking about progressive confusion and more rapid decline in memory with acute illness in patients with underlying memory problems such as dementia.  We spent time talking about CODE STATUS and she confirms that he would not want resuscitation or intubation.  I confirmed his DNR/DNI status.  We spent time talking about his significant alteration with his acute illness including agitation.  We talked about the treatments that are being given to try to encourage him to recover.  However, we I am unsure if he is able to bounce back to near his previous baseline and if so, how close he will get.  Unfortunately it will take some time to  better be able to fill it out.  [name] openly shares that she does not think that he will bounce back given his recent decline and now his sudden acute illness with profound change.   We spent time talking about the what-ifs such as how to take care of him if he does not return to baseline.  We talked about the possibility of transitioning to a more comfort focused approach if his new normal ends up in a state not consistent with something he would be comfortable with.  We talked about the possibility of engaging with hospice for possible services. I described hospice as a service for patients who  have a life expectancy of 6 months or less. The goal of hospice is the preservation of dignity and quality at the end phases of life. Under hospice care, the focus changes from curative to symptom relief. I explained the three setting where hospice services can be provided including the home, at a living facility (such as LTC SNF, Assisted Living, etc), and a hospice facility. I explained that acceptance to hospice in any specific location is the final decision of the hospice medical director and bed availability, if applicable. They verbalized understanding. [name] seems open to a discussion about hospice, pending his clinical progression.  However, she needs to discuss a lot of this with her brother.  She shares that he will be here on Sunday and is interested in a family meeting at that time for ongoing conversations.  I shared that I would check in on her father tomorrow and update her with information.  I shared that I would discuss with a colleague at the possibility of meeting on Sunday for ongoing GOC conversations and support of the patient's family.  Goals: DNR/DNI, time for outcomes, family seems open to hospice discussion if the patient does not significantly improve, hoping for family meeting on Sunday with somebody from palliative medicine.  Review of Systems  Constitutional:        Denies pain  in general  Respiratory:  Negative for shortness of breath.   Cardiovascular:  Negative for chest pain.  Gastrointestinal:  Negative for abdominal pain, nausea and vomiting.    Objective:   Primary Diagnoses: Present on Admission:  Essential hypertension  GERD  BPH (benign prostatic hyperplasia)  Cognitive decline  Dementia with behavioral disturbance (HCC)  Acute encephalopathy  Hypertension   Vital Signs:  BP (!) 144/65 (BP Location: Right Arm)   Pulse 73   Temp 98.2 F (36.8 C) (Oral)   Resp 16   SpO2 100%   Physical Exam Vitals and nursing note reviewed.  Constitutional:      General: He is sleeping. He is not in acute distress.    Appearance: He is ill-appearing.     Comments: Awakens to voice/touch, but falls back asleep easily  HENT:     Head: Normocephalic and atraumatic.  Cardiovascular:     Rate and Rhythm: Normal rate.  Pulmonary:     Effort: Pulmonary effort is normal. No respiratory distress.  Abdominal:     General: Abdomen is flat. There is no distension.     Palpations: Abdomen is soft.  Skin:    General: Skin is warm and dry.  Neurological:     Mental Status: He is disoriented.     Comments: Oriented to person only     Palliative Assessment/Data: 40%   Assessment & Plan:   HPI/Patient Profile: 84 y.o. male  with past medical history of mania, CAD s/p CABG, hypertension, type 2 diabetes, CKD stage III, chronic diastolic heart failure who presents with acute agitation, altered mental status, reported seizure-like activity.  After ED evaluation he was admitted on 06/17/2024 with encephalopathy, SDH, agitation, reported seizure-like activity, and others.   Palliative medicine was consulted for GOC conversations.  SUMMARY OF RECOMMENDATIONS   DNR-Limited (DNR/DNI) Continue current medical treatment Time for outcomes Family discussing amongst themselves possible options moving forward Family seems open to hospice discussion pending how he  does in the coming couple days Palliative medicine will try to meet with the patient's daughter and son on Sunday for ongoing GOC conversations Palliative medicine will continue to  follow  Symptom Management:  Per primary team Palliative medicine is available to assist as needed  Code Status: DNR - Limited (DNR/DNI)  Prognosis:  Unable to determine  Discharge Planning:  To Be Determined   Discussed with: Patient, family, medical team, nursing team    Thank you for allowing us  to participate in the care of Oddis Westling Cangemi PMT will continue to support holistically.  Time Total: 60  Detailed review of medical records (labs, imaging, vital signs), medically appropriate exam, discussed with treatment team, counseling and education to patient, family, & staff, documenting clinical information, medication management, coordination of care  Signed by: Camellia Kays, NP Palliative Medicine Team  Team Phone # [phone] (Nights/Weekends)  06/19/2024, 2:40 PM  "

## 2024-06-19 NOTE — Progress Notes (Signed)
 Physical Therapy Treatment Patient Details Name: Justin Salinas MRN: [id number] DOB: 24-Nov-1940 Today's Date: 06/19/2024   History of Present Illness 84 y.o. male presents to [facility] 06/17/24 from ALF with acute agitation, AMS, and reported seizure-like activity. Two prior falls at facility. CT head showed acute 4mm R parafalcine SDH w/o midline shift.  Prior admit 1/10 with acute encephalopathy. PMH: CAD s/p CABG x4, DMII, HTN, BPH, lumbar radiculopathy, chronic diastolic heart failure, dementia.    PT Comments  Pt with fair tolerance to treatment today. Pt very lethargic this session keeping eyes closed for majority of session. Very poor command following however pt was able to stand x5 with Max A progressing to Mod A with HHA. DC recs updated to SNF. PT will continue to follow.     If plan is discharge home, recommend the following: A lot of help with walking and/or transfers;A lot of help with bathing/dressing/bathroom;Assistance with cooking/housework;Direct supervision/assist for medications management;Direct supervision/assist for financial management;Assist for transportation;Help with stairs or ramp for entrance   Can travel by private vehicle     No  Equipment Recommendations  None recommended by PT    Recommendations for Other Services       Precautions / Restrictions Precautions Precautions: Fall Recall of Precautions/Restrictions: Impaired Precaution/Restrictions Comments: SBP <160 Restrictions Weight Bearing Restrictions Per Provider Order: No     Mobility  Bed Mobility Overal bed mobility: Needs Assistance Bed Mobility: Supine to Sit, Sit to Supine     Supine to sit: Total assist Sit to supine: Total assist   General bed mobility comments: pt not following commands to assist with coming to EOB. Total A via helicopter method.    Transfers Overall transfer level: Needs assistance Equipment used: 1 person hand held assist Transfers: Sit to/from Stand Sit to  Stand: Max assist, Mod assist           General transfer comment: Pt again not following commands. Attempted sit to stand with RW however pt unable to follow commands for sequencing. Once RW was removed pt began attempting to stand on his own. Able to perform a total of 5 sit to stands with 1 person HHA Max A assist progressing to Mod A.    Ambulation/Gait             Pre-gait activities: Unable to march in place. General Gait Details: Deferred due to lack of +2 help.   Stairs             Wheelchair Mobility     Tilt Bed    Modified Rankin (Stroke Patients Only)       Balance Overall balance assessment: Needs assistance, History of Falls Sitting-balance support: Bilateral upper extremity supported, Feet unsupported Sitting balance-Leahy Scale: Poor Sitting balance - Comments: Up to Mod A due to R lateral and posterior lean. Postural control: Right lateral lean, Posterior lean Standing balance support: Single extremity supported, During functional activity Standing balance-Leahy Scale: Poor Standing balance comment: Reliant on external support.                            Communication Communication Communication: Impaired Factors Affecting Communication: Hearing impaired  Cognition Arousal: Lethargic Behavior During Therapy: Flat affect   PT - Cognitive impairments: History of cognitive impairments, Orientation, Awareness, Memory, Attention, Sequencing, Problem solving, Safety/Judgement                       PT - Cognition  Comments: Lethargic with pt primarily keeping eyes closed. Limited following of commands. Responded yeah to all questions. Following commands: Impaired Following commands impaired: Follows one step commands inconsistently, Follows one step commands with increased time    Cueing Cueing Techniques: Verbal cues, Gestural cues, Tactile cues  Exercises      General Comments General comments (skin integrity, edema,  etc.): VSS      Pertinent Vitals/Pain Pain Assessment Pain Assessment: Faces Faces Pain Scale: No hurt    Home Living                          Prior Function            PT Goals (current goals can now be found in the care plan section) Progress towards PT goals: Progressing toward goals    Frequency    Min 2X/week      PT Plan      Co-evaluation              AM-PAC PT 6 Clicks Mobility   Outcome Measure  Help needed turning from your back to your side while in a flat bed without using bedrails?: Total Help needed moving from lying on your back to sitting on the side of a flat bed without using bedrails?: Total Help needed moving to and from a bed to a chair (including a wheelchair)?: A Lot Help needed standing up from a chair using your arms (e.g., wheelchair or bedside chair)?: A Lot Help needed to walk in hospital room?: A Lot Help needed climbing 3-5 steps with a railing? : Total 6 Click Score: 9    End of Session Equipment Utilized During Treatment: Gait belt Activity Tolerance: Patient limited by lethargy;Patient tolerated treatment well Patient left: in bed;with call bell/phone within reach;with bed alarm set Nurse Communication: Mobility status PT Visit Diagnosis: Unsteadiness on feet (R26.81);Other abnormalities of gait and mobility (R26.89);Muscle weakness (generalized) (M62.81);History of falling (Z91.81)     Time: 8468-8452 PT Time Calculation (min) (ACUTE ONLY): 16 min  Charges:    $Therapeutic Activity: 8-22 mins PT General Charges $$ ACUTE PT VISIT: 1 Visit                     Jerrin Recore B, PT, DPT Acute Rehab Services [phone]    Justin Salinas 06/19/2024, 5:25 PM

## 2024-06-20 DIAGNOSIS — I2510 Atherosclerotic heart disease of native coronary artery without angina pectoris: Secondary | ICD-10-CM

## 2024-06-20 DIAGNOSIS — R451 Restlessness and agitation: Secondary | ICD-10-CM

## 2024-06-20 DIAGNOSIS — Z2989 Encounter for other specified prophylactic measures: Secondary | ICD-10-CM

## 2024-06-20 DIAGNOSIS — B9620 Unspecified Escherichia coli [E. coli] as the cause of diseases classified elsewhere: Secondary | ICD-10-CM | POA: Diagnosis not present

## 2024-06-20 DIAGNOSIS — N390 Urinary tract infection, site not specified: Secondary | ICD-10-CM

## 2024-06-20 DIAGNOSIS — N1832 Chronic kidney disease, stage 3b: Secondary | ICD-10-CM | POA: Diagnosis not present

## 2024-06-20 DIAGNOSIS — I5033 Acute on chronic diastolic (congestive) heart failure: Secondary | ICD-10-CM | POA: Diagnosis not present

## 2024-06-20 DIAGNOSIS — I130 Hypertensive heart and chronic kidney disease with heart failure and stage 1 through stage 4 chronic kidney disease, or unspecified chronic kidney disease: Secondary | ICD-10-CM

## 2024-06-20 DIAGNOSIS — I6200 Nontraumatic subdural hemorrhage, unspecified: Secondary | ICD-10-CM | POA: Diagnosis not present

## 2024-06-20 DIAGNOSIS — G9340 Encephalopathy, unspecified: Secondary | ICD-10-CM

## 2024-06-20 DIAGNOSIS — E1122 Type 2 diabetes mellitus with diabetic chronic kidney disease: Secondary | ICD-10-CM

## 2024-06-20 LAB — CBC
HCT: 39.4 % (ref 39.0–52.0)
Hemoglobin: 13 g/dL (ref 13.0–17.0)
MCH: 32.3 pg (ref 26.0–34.0)
MCHC: 33 g/dL (ref 30.0–36.0)
MCV: 97.8 fL (ref 80.0–100.0)
Platelets: 196 K/uL (ref 150–400)
RBC: 4.03 MIL/uL — ABNORMAL LOW (ref 4.22–5.81)
RDW: 12.7 % (ref 11.5–15.5)
WBC: 8.2 K/uL (ref 4.0–10.5)
nRBC: 0 % (ref 0.0–0.2)

## 2024-06-20 LAB — BASIC METABOLIC PANEL WITH GFR
Anion gap: 13 (ref 5–15)
BUN: 36 mg/dL — ABNORMAL HIGH (ref 8–23)
CO2: 20 mmol/L — ABNORMAL LOW (ref 22–32)
Calcium: 9.5 mg/dL (ref 8.9–10.3)
Chloride: 106 mmol/L (ref 98–111)
Creatinine, Ser: 1.45 mg/dL — ABNORMAL HIGH (ref 0.61–1.24)
GFR, Estimated: 47 mL/min — ABNORMAL LOW
Glucose, Bld: 150 mg/dL — ABNORMAL HIGH (ref 70–99)
Potassium: 4.2 mmol/L (ref 3.5–5.1)
Sodium: 139 mmol/L (ref 135–145)

## 2024-06-20 LAB — GLUCOSE, CAPILLARY
Glucose-Capillary: 142 mg/dL — ABNORMAL HIGH (ref 70–99)
Glucose-Capillary: 338 mg/dL — ABNORMAL HIGH (ref 70–99)
Glucose-Capillary: 224 mg/dL — ABNORMAL HIGH (ref 70–99)
Glucose-Capillary: 296 mg/dL — ABNORMAL HIGH (ref 70–99)

## 2024-06-20 MED ORDER — ENOXAPARIN SODIUM 40 MG/0.4ML IJ SOSY
40.0000 mg | PREFILLED_SYRINGE | INTRAMUSCULAR | Status: DC
  Administered 2024-06-20 – 2024-06-22 (×3): 40 mg via SUBCUTANEOUS
  Filled 2024-06-20 (×3): qty 0.4

## 2024-06-20 NOTE — Plan of Care (Signed)
  Problem: Coping: Goal: Ability to adjust to condition or change in health will improve Outcome: Not Progressing

## 2024-06-20 NOTE — Progress Notes (Signed)
 "  HD#0 SUBJECTIVE:  Patient Summary: Justin Salinas is a 84 y.o. male residing in assisted living facility, with past medical history of CAD s/p CABG, hypertension, type 2 diabetes, CKD stage III, chronic diastolic heart failure who presents with acute agitation, altered mental status, reported seizure-like activity and admitted for encephalopathy workup and SDH.    Recent discharge and treatment for UTI 1/12.  Changes to home medication regiment at last hospitalization: previously on risperidone  for 6 months but was discontinued due to concerns for NMS and cogwheel rigidity. After discharge had 2 episodes of fall, one episode on 1/13 reported from staff at assisted living facility. This was day Seroquel  was started. On 1/14 is had another fall and staff reported seizure like activities. Denied head trauma x2. ED CT scan showed 4 mm SDH. UDS negative. TSH, B1: NL in prior admission.   Overnight Events and Interim History: NEO. Feels well this morning. Not alert and oriented but he is pleasant. He denies discomfort or pain.  OBJECTIVE:  Vital Signs: Vitals:   06/19/24 1518 06/19/24 2005 06/20/24 0020 06/20/24 0400  BP:  (!) 122/56 (!) 138/57 (!) 154/49  Pulse:  71 67 64  Resp:  16 14 17   Temp: 98.3 F (36.8 C) 98.1 F (36.7 C) 98 F (36.7 C) 98.2 F (36.8 C)  TempSrc: Axillary Oral Oral Oral  SpO2: 100% 96% 100% 96%   Supplemental O2: Room Air SpO2: 96 %  There were no vitals filed for this visit.   Intake/Output Summary (Last 24 hours) at 06/20/2024 0734 Last data filed at 06/20/2024 0437 Gross per 24 hour  Intake 360 ml  Output 1050 ml  Net -690 ml   Net IO Since Admission: -690 mL [06/20/24 0734]  Physical Exam: Physical Exam Constitutional:      General: He is not in acute distress.    Appearance: He is not ill-appearing or toxic-appearing.  Cardiovascular:     Rate and Rhythm: Normal rate and regular rhythm.     Pulses: Normal pulses.  Pulmonary:     Effort:  Pulmonary effort is normal.  Musculoskeletal:     Right lower leg: No edema.     Left lower leg: No edema.  Skin:    General: Skin is warm and dry.  Neurological:     Mental Status: He is alert. He is disoriented.     Comments: Alert, not oriented. Unrestrained in bed. Pleasant. Tracks with eyes and converses with one word answers.  Patient Lines/Drains/Airways Status     Active Line/Drains/Airways     Name Placement date Placement time Site Days   Peripheral IV 06/17/24 16 G 1 Anterior;Distal;Left;Upper Arm 06/17/24  1813  Arm  3   External Urinary Catheter 06/18/24  1744  --  2           ASSESSMENT/PLAN:  Assessment: Principal Problem:   Acute encephalopathy Active Problems:   Controlled type 2 diabetes mellitus, with long-term current use of insulin  (HCC)   Essential hypertension   GERD   BPH (benign prostatic hyperplasia)   Hypertension   Recurrent falls   Cognitive decline   Dementia with behavioral disturbance (HCC)   SDH (subdural hematoma) (HCC)  Plan: #Acute Encephalopathy  #Agitation #Acute SDH, 4 mm right parafalcine (mBIG 1) Encephalopathy improving. Progressive dementia c/b SDH most likely cause, but considered dopaminergic rebound with cessation of risperidone  vs seizure. Do not suspect recurrent UTI. Will continue to hold risperidone  given suspicion of parkinsonian features and continue to  provide Seroquel  25 mg nightly and consider Cinamet inpatient. Palliative care consulted for conversation with daughter. - Delirium bundle: pain/constipation/urinary retention, sleep/wake, reorientation, minimize restraints when possible. - Avoid benzos if possible (Ativan  may worsen delirium/resp depression) - Continue Sertraline  100 mg - Avoid haloperidol /risperidone  if Parkinsonism/LBD suspected. - melatonin nightly  - Hold aspirin  and anticoagulation  - Maintain SBP <160, avoid hypotension - Fall precautions, PT/OT when stable - Palliative consulted   #Spell:  syncope vs seizure vs mechanical fall Mixed story; transient unilateral limpness reported; subdural could be traumatic or incidental but treat as traumatic until proven otherwise.  EEG unremarkable for seizure.  Electrolytes WNL. - Keppra  500 mg BID (dose adjust if renal function worsens). Consider stopping after 7 days.  - Check orthostatics when safe   #Recent UTI (E. coli)  Finished course of antibiotics.  Urinalysis this admission unremarkable.   - Repeat UA/UC if fever/leukocytosis/urinary symptoms or unclear baseline. - Bladder scan/PVR; treat retention if present.  #HTN / CAD / HFpEF Goal SBP <160. Avoid hypotension  - amlodipine  10 mg ordered - Spironolactone  25 mg daily ordered - hydralazine  25 mg q 8 hours PRN ordered  - Avoid hypotension/orthostasis. - Telemetry given syncope concern and CAD history.  #DM2 / RXI6a Stress hyperglycemia contributes to delirium, CKD affects Keppra  dosing and med selection. Cr. 1.48 (1.41 1/15).  - Sliding Scale Insulin  - Hold home meds - Trend Cr; renally dose meds.  # VTE prophylaxis Small SDH. 48+ hours stability, stop SCDs to avoid delirium, start lovenox .   Best Practice: Diet: Dysphagia 1 VTE: SCDs Start: 06/17/24 2159 Code: DNR-limited   Disposition planning: Therapy Recs: Pending, DME: other recommendations Family Contact: daughter, notified.  DISPO: Anticipated discharge Monday to assisted living facility pending availability.  Signature: Lonni Africa, D.O.  Internal Medicine Resident, PGY-2 [facility]  Pager: # [phone]. 7:34 AM, 06/20/2024  "

## 2024-06-20 NOTE — Progress Notes (Signed)
 RN offered to assist patient in the chair, pt declined.

## 2024-06-20 NOTE — Progress Notes (Signed)
 " Daily Progress Note   Date: 06/20/2024   Patient Name: Justin Salinas  DOB: 1940/06/27  MRN: [id number]  Age / Sex: 84 y.o., male  Attending Physician: Eben Reyes BROCKS, MD Primary Care Physician: Kennyth Worth HERO, MD Admit Date: 06/17/2024 Length of Stay: 0 days  Reason for Follow-up: Establishing goals of care  Past Medical History:  Diagnosis Date   Allergy    Anemia    Asthma    Atherosclerosis of abdominal aorta 02/26/2020   Seen on x-ray L-spine September 2021   BPH (benign prostatic hyperplasia)    CAD (coronary artery disease)    a. s/p MI 09/1993;  b. known CTO of RCA;  c. 06/2014 Cath: LM short/mod dzs, LAD 95ost, LCX nl, RI nl, RCA 100, EF 55%;  c. 06/2014 s/p CABG x 4: LIMA->LAD, VG->D1, VG->OM1, VG->Acute Marginal.   Carotid stenosis    a. Carotid US  1/17:  RICA 40-59%; LICA 60-79% // Carotid US  1/22: R 1-39; L 40-59; L VA aberrant flow; bilat subclavian stenosis // Carotid US  1/23: R 1-39, L 40-59; R subclavian stenosis   CKD (chronic kidney disease), stage III (HCC)    Constipation    in the past 2-3 weeks   Diabetes (HCC)    AODM   Diastolic dysfunction    a. 05/2015 Echo: EF 60-65%, LVH, Gr 1 DD, mild MR, mod dil RA/LA, mod TR, PASP .   Essential hypertension    GERD (gastroesophageal reflux disease)    History of kidney stones    Hyperlipidemia     Subjective:   Subjective: Chart Reviewed. Updates received. Patient Assessed. Created space and opportunity for patient  and family to explore thoughts and feelings regarding current medical situation.  Today's Discussion: Today before meeting with the patient/family, I reviewed the chart notes including TOC note from yesterday, PT note from yesterday, nurse note from yesterday, hospitalist note from today, nursing note from today. I also reviewed vital signs, nursing flowsheets, medication administrations record, labs, and imaging. Labs reviewed include CBC today showing normalization of hemoglobin at  13.0.  BMP with continued improvement creatinine now 1.45, was up to 1.75 seven days ago in the setting of CKD 3, fall with SDH, encephalopathy.  Today saw the patient at bedside, no family is present.  He was sleeping but easily awakened to voice and touch.  He was much more alert and interactive today, very pleasant with no agitation.  He was able to answer questions for review of systems appropriately.  He gave me permission to reach out to his daughter to let her know I saw him today.  Towards the end of my visit he asked if I could get the nurse to help him get out of the bed and into the chair and I said I would.  After I left the room I notified charge nurse at the patient's request to get out of the bed to the chair.  Later in the day I attempted to call the patient's daughter [name] to update her on his progress today and confirmed desire for family meeting tomorrow with her and her brother Cathlyn as well as somebody from our team.  When I called her went to voicemail and I left a HIPAA compliant message.  I will request a colleague to reach out tomorrow morning to see if there is a time that that would be good for them to meet tomorrow to discuss his progress over the past few days and try to come  to some conclusions about how to proceed.  At this point I seem open to hospice if needed, but if he continues to improve palliative care outpatient may be another option.  I provided emotional and general support through therapeutic listening, empathy, sharing of stories, and other techniques. I answered all questions and addressed all concerns to the best of my ability.  Review of Systems  Constitutional:  Positive for fatigue.       Denies pain in general  Respiratory:  Negative for shortness of breath.   Gastrointestinal:  Negative for abdominal pain, nausea and vomiting.    Objective:   Primary Diagnoses: Present on Admission:  Essential hypertension  GERD  BPH (benign prostatic  hyperplasia)  Cognitive decline  Dementia with behavioral disturbance (HCC)  Acute encephalopathy  Hypertension   Vital Signs:  BP (!) 154/49 (BP Location: Right Arm)   Pulse 74   Temp 97.7 F (36.5 C) (Oral)   Resp 17   SpO2 97%   Physical Exam Vitals and nursing note reviewed.  Constitutional:      General: He is sleeping. He is not in acute distress. HENT:     Head: Normocephalic and atraumatic.  Cardiovascular:     Rate and Rhythm: Normal rate.  Pulmonary:     Effort: Pulmonary effort is normal. No respiratory distress.  Abdominal:     General: Abdomen is flat. Bowel sounds are normal. There is no distension.     Palpations: Abdomen is soft.     Tenderness: There is no abdominal tenderness.  Skin:    General: Skin is warm and dry.  Neurological:     Mental Status: He is easily aroused. He is disoriented.     Comments: Pleasantly confused  Psychiatric:        Mood and Affect: Mood normal.        Behavior: Behavior normal.     Palliative Assessment/Data: 40-50%   Existing Vynca/ACP Documentation: DNR effective 01/17/2024  Assessment & Plan:   HPI/Patient Profile:  84 y.o. male  with past medical history of mania, CAD s/p CABG, hypertension, type 2 diabetes, CKD stage III, chronic diastolic heart failure who presents with acute agitation, altered mental status, reported seizure-like activity.  After ED evaluation he was admitted on 06/17/2024 with encephalopathy, SDH, agitation, reported seizure-like activity, and others.    Palliative medicine was consulted for GOC conversations.  SUMMARY OF RECOMMENDATIONS   Continue DNR-Limited (DNR/DNI) Continue current scope of care More time for outcomes Anticipate possible family meeting tomorrow with the patient's daughter and son for ongoing GOC and possible decision making Previously they seemed open to hospice, but if not ready for that yet outpatient palliative would be appropriate as well Palliative medicine will  continue to follow  Symptom Management:  Per primary team Palliative medicine is available to assist as needed  Code Status: DNR - Limited (DNR/DNI)  Prognosis: Unable to determine  Discharge Planning: To Be Determined  Discussed with: Patient, medical team, nursing team  Thank you for allowing us  to participate in the care of [facility] PMT will continue to support holistically.  Time Total: 30 min  Detailed review of medical records (labs, imaging, vital signs), medically appropriate exam, discussed with treatment team, counseling and education to patient, family, & staff, documenting clinical information, medication management, coordination of care  Camellia Kays, NP Palliative Medicine Team  Team Phone # [phone] (Nights/Weekends)  01/31/2021, 8:17 AM  "

## 2024-06-21 LAB — GLUCOSE, CAPILLARY
Glucose-Capillary: 217 mg/dL — ABNORMAL HIGH (ref 70–99)
Glucose-Capillary: 315 mg/dL — ABNORMAL HIGH (ref 70–99)
Glucose-Capillary: 282 mg/dL — ABNORMAL HIGH (ref 70–99)
Glucose-Capillary: 153 mg/dL — ABNORMAL HIGH (ref 70–99)

## 2024-06-21 MED ORDER — INSULIN GLARGINE-YFGN 100 UNIT/ML ~~LOC~~ SOLN
5.0000 [IU] | Freq: Every day | SUBCUTANEOUS | Status: DC
  Administered 2024-06-21: 5 [IU] via SUBCUTANEOUS
  Filled 2024-06-21 (×2): qty 0.05

## 2024-06-21 NOTE — Plan of Care (Signed)
  Problem: Coping: Goal: Ability to adjust to condition or change in health will improve Outcome: Progressing   Problem: Health Behavior/Discharge Planning: Goal: Ability to identify and utilize available resources and services will improve Outcome: Progressing   

## 2024-06-21 NOTE — Progress Notes (Addendum)
 "  HD#0 SUBJECTIVE:  Patient Summary: Justin Salinas is a 84 y.o. male residing in assisted living facility, with past medical history of [place], CAD s/p CABG, hypertension, type 2 diabetes, CKD stage III, chronic diastolic heart failure who presents with acute agitation, altered mental status, reported seizure-like activity and admitted for encephalopathy workup and SDH.   Recent discharge and treatment for UTI 1/12.  Changes to home medication regiment at last hospitalization: previously on risperidone  for 6 months but was discontinued due to concerns for NMS and cogwheel rigidity. After discharge had 2 episodes of fall, one episode on 1/13 reported from staff at assisted living facility. This was day Seroquel  was started. On 1/14 is had another fall and staff reported seizure like activities. Denied head trauma x2. ED CT scan showed 4 mm SDH. UDS negative. TSH, B1: NL in prior admission.   Overnight Events: no overnight events.   Interim History: Assessed patient bedside. Alert to name and with options was oriented to location. Stated year was 2001 and month was February (even when presented with options). Patient was able to name what a pen was. States he is in no pain. No current complaints or concerns.   OBJECTIVE:  Vital Signs: Vitals:   06/20/24 1941 06/21/24 0400 06/21/24 0722 06/21/24 1047  BP: (!) 152/65 (!) 155/71 (!) 185/80 137/63  Pulse: 71 71 71   Resp: 16 18 16    Temp: 98.3 F (36.8 C) 97.9 F (36.6 C) (!) 97.5 F (36.4 C)   TempSrc: Oral Oral Oral   SpO2: 96% 98% 98%    Supplemental O2: Room Air SpO2: 98 %  There were no vitals filed for this visit.   Intake/Output Summary (Last 24 hours) at 06/21/2024 1224 Last data filed at 06/21/2024 1000 Gross per 24 hour  Intake 1140 ml  Output 1350 ml  Net -210 ml   Net IO Since Admission: -660 mL [06/21/24 1224]  Physical Exam: Physical Exam Constitutional:      General: He is not in acute distress.    Appearance:  He is not ill-appearing or toxic-appearing.  Eyes:     Pupils: Pupils are equal, round, and reactive to light.  Cardiovascular:     Rate and Rhythm: Normal rate and regular rhythm.     Pulses: Normal pulses.     Heart sounds: No murmur heard.    No friction rub. No gallop.  Pulmonary:     Effort: Pulmonary effort is normal.     Breath sounds: Normal breath sounds. No stridor. No wheezing, rhonchi or rales.     Comments: Anterior lungs Abdominal:     General: Bowel sounds are normal.     Palpations: Abdomen is soft.     Tenderness: There is no guarding.  Musculoskeletal:     Right lower leg: No edema.     Left lower leg: No edema.  Skin:    General: Skin is warm and dry.  Neurological:     Mental Status: He is alert. He is disoriented.  Psychiatric:        Attention and Perception: Attention normal.        Mood and Affect: Mood normal.        Behavior: Behavior is cooperative.     Patient Lines/Drains/Airways Status     Active Line/Drains/Airways     Name Placement date Placement time Site Days   Peripheral IV 06/17/24 16 G 1 Anterior;Distal;Left;Upper Arm 06/17/24  1813  Arm  4   External  Urinary Catheter 06/18/24  1744  --  3            Pertinent labs and imaging:      Latest Ref Rng & Units 06/20/2024    6:03 AM 06/19/2024    2:31 AM 06/18/2024    2:39 AM  CBC  WBC 4.0 - 10.5 K/uL 8.2  7.5  7.5   Hemoglobin 13.0 - 17.0 g/dL 86.9  87.8  88.5   Hematocrit 39.0 - 52.0 % 39.4  36.6  35.2   Platelets 150 - 400 K/uL 196  196  178        Latest Ref Rng & Units 06/20/2024    6:03 AM 06/19/2024    2:31 AM 06/18/2024    2:39 AM  CMP  Glucose 70 - 99 mg/dL 849  859  808   BUN 8 - 23 mg/dL 36  34  44   Creatinine 0.61 - 1.24 mg/dL 8.54  8.51  8.58   Sodium 135 - 145 mmol/L 139  139  137   Potassium 3.5 - 5.1 mmol/L 4.2  4.2  3.9   Chloride 98 - 111 mmol/L 106  104  102   CO2 22 - 32 mmol/L 20  26  25    Calcium  8.9 - 10.3 mg/dL 9.5  9.3  9.4   Total Protein 6.5  - 8.1 g/dL   6.7   Total Bilirubin 0.0 - 1.2 mg/dL   0.4   Alkaline Phos 38 - 126 U/L   76   AST 15 - 41 U/L   25   ALT 0 - 44 U/L   20     No results found.   ASSESSMENT/PLAN:  Assessment: Principal Problem:   Acute encephalopathy Active Problems:   Controlled type 2 diabetes mellitus, with long-term current use of insulin  (HCC)   Essential hypertension   GERD   BPH (benign prostatic hyperplasia)   Hypertension   Recurrent falls   Cognitive decline   Dementia with behavioral disturbance (HCC)   SDH (subdural hematoma) (HCC)   Plan: #Acute Encephalopathy  #Agitation #Acute SDH, 4 mm right parafalcine (mBIG 1) Current encephalopathy differential diagnosis includes: SDH vs dopaminergic rebound with cessation of risperidone  vs seizure versus progressive dementia. Curbside consult with neurology recommended continued cessation of risperidone  given suspicion of parkinsonian features. Recommended continuing Seroquel  25 mg nightly and consider Cinamet inpatient. Palliative care consulted for conversation with daughter. Patient comfortable during examination.  - Delirium bundle: pain/constipation/urinary retention, sleep/wake, reorientation, minimize restraints when possible. - Avoid benzos if possible (Ativan  may worsen delirium/resp depression) - Continue Sertraline  100 mg - Avoid haloperidol /risperidone  if Parkinsonism/LBD suspected. - melatonin nightly  - Neuro checks (q4h initially depending on unit). - Hold aspirin  and anticoagulation  - Maintain SBP <160, avoid hypotension - Fall precautions, PT/OT when stable - Palliative consulted  - 1/16 goals per note: DNR/DNI, family seems open to hospice discussion if the patient does not significantly improve, hoping for family meeting on 1/18 with somebody from palliative medicine.   #Spell: syncope vs seizure vs mechanical fall Mixed story; transient unilateral limpness reported; subdural could be traumatic or incidental but treat  as traumatic until proven otherwise.  EEG unremarkable for seizure.  Electrolytes WNL. - Keppra  500 mg BID (dose adjust if renal function worsens). Consider stopping after 7 days.    #Recent UTI (E. coli)  Finished course of antibiotics.  Urinalysis this admission unremarkable.   - Repeat UA/UC if fever/leukocytosis/urinary symptoms or unclear baseline. -  Bladder scan/PVR; treat retention if present.  #HTN / CAD / HFpEF Goal SBP <160. Avoid hypotension  - amlodipine  10 mg ordered - Spironolactone  25 mg daily ordered - hydralazine  25 mg q 8 hours PRN ordered  - Avoid hypotension/orthostasis - Telemetry given syncope concern and CAD history.  #DM2 / RXI6a Stress hyperglycemia contributes to delirium, CKD affects Keppra  dosing and med selection. - Sliding Scale Insulin  - Hold home meds - Trend Cr; renally dose meds.  # VTE prophylaxis Small SDH   - Avoid chemical VTE ppx initially. - Mechanical prophylaxis now.  Best Practice: Diet: Dysphagia 1 VTE: enoxaparin  (LOVENOX ) injection 40 mg Start: 06/20/24 1330 Code: DNR-limited  Disposition planning: Therapy Recs: Pending, DME: other recommendations Family Contact: daughter, notified.  DISPO: Anticipated discharge pending clinical improvement to SNF at previous assisted living facility  Signature:  Shala Baumbach [facility]  12:24 PM, 06/21/2024  On Call pager [phone]  "

## 2024-06-21 NOTE — Progress Notes (Signed)
 Palliative:  HPI: 84 y.o. male  with past medical history of mania, CAD s/p CABG, hypertension, type 2 diabetes, CKD stage III, chronic diastolic heart failure who presents with acute agitation, altered mental status, reported seizure-like activity.  After ED evaluation he was admitted on 06/17/2024 with encephalopathy, SDH, agitation, reported seizure-like activity, and others. Palliative medicine was consulted for GOC conversations.  I reviewed records and progress. I confirmed meeting with family at bedside per my colleagues report. I met at Justin Salinas bedside along with children [name] Ny, and Garrel. Justin Salinas is sitting up in recliner. His lunch tray is in front of him almost 100% eaten except 1 side. Appetite is very good. He is sitting comfortably cross legged in chair. He is present for discussion but does not participate.   Long discussion with family at bedside about decline especially since pneumonia in April 2025. He has been living at [facility] for ~2 years now and the staff are good to him there. Family are happy to see his progress and how much he has improved over past couple days. We reviewed the discussion for path forward. We discussed SNF rehab vs focus on comfort with potential hospice support. I agree with family that I do not feel he is likely hospice appropriate right now. They agree with SNF rehab with hopes of rehab at [facility] where he is familiar and staff familiar with him. Goal at this stage is for SNF rehab as a bridge for return to ALF. We discussed this process and how TOC will assist and work with [employer].   We further discussed risks of further decline in the future. They share that he has had 2 siblings die from dementia just over the past 30 days. They have seen and recognize the decline and what these family members went through and can see what likely lies ahead for them with their father. They confirm he has DNR and desire for  natural death. Quality of life is priority and they do not want artificial means to prolong life. I introduced MOST form and we discussed in detail. Family seem to have a good idea of what Justin Salinas would want. DNR is in place and he would never want a feeding tube - I confirmed that these are very reasonable decisions for him and I agree. Likely to elect for more conservative care. They seem agreeable to utilizing hospice and comfort in the future if further decline. Family ask good insightful questions and want to ensure that they are helping to ensure that he has smooth transition of care along the way.   I left them to further discuss MOST - they will leave completed form for me in the room. [name] brother also comes to the bedside so I left them to visit together.   All questions/concerns addressed. Emotional support provided.   Exam: Awake, alert, underlying confusion/forgetfulness. No distress. Breathing regular, unlabored. Abd soft. Moves all extremities.   Plan: - DNR - Family completing MOST form - Hopeful for SNF rehab at [facility]  50 min  Bernarda Kitty, NP Palliative Medicine Team Pager [phone] (Please see [email or web address] for schedule) Team Phone [phone]

## 2024-06-22 DIAGNOSIS — S065X0D Traumatic subdural hemorrhage without loss of consciousness, subsequent encounter: Secondary | ICD-10-CM | POA: Diagnosis not present

## 2024-06-22 DIAGNOSIS — Z794 Long term (current) use of insulin: Secondary | ICD-10-CM | POA: Diagnosis not present

## 2024-06-22 DIAGNOSIS — I5032 Chronic diastolic (congestive) heart failure: Secondary | ICD-10-CM

## 2024-06-22 DIAGNOSIS — G9340 Encephalopathy, unspecified: Secondary | ICD-10-CM | POA: Diagnosis not present

## 2024-06-22 DIAGNOSIS — N1832 Chronic kidney disease, stage 3b: Secondary | ICD-10-CM | POA: Diagnosis not present

## 2024-06-22 DIAGNOSIS — I2510 Atherosclerotic heart disease of native coronary artery without angina pectoris: Secondary | ICD-10-CM | POA: Diagnosis not present

## 2024-06-22 DIAGNOSIS — W19XXXD Unspecified fall, subsequent encounter: Secondary | ICD-10-CM | POA: Diagnosis not present

## 2024-06-22 DIAGNOSIS — R451 Restlessness and agitation: Secondary | ICD-10-CM | POA: Diagnosis not present

## 2024-06-22 DIAGNOSIS — I130 Hypertensive heart and chronic kidney disease with heart failure and stage 1 through stage 4 chronic kidney disease, or unspecified chronic kidney disease: Secondary | ICD-10-CM | POA: Diagnosis not present

## 2024-06-22 DIAGNOSIS — E1122 Type 2 diabetes mellitus with diabetic chronic kidney disease: Secondary | ICD-10-CM | POA: Diagnosis not present

## 2024-06-22 DIAGNOSIS — N400 Enlarged prostate without lower urinary tract symptoms: Secondary | ICD-10-CM

## 2024-06-22 LAB — GLUCOSE, CAPILLARY
Glucose-Capillary: 222 mg/dL — ABNORMAL HIGH (ref 70–99)
Glucose-Capillary: 349 mg/dL — ABNORMAL HIGH (ref 70–99)
Glucose-Capillary: 252 mg/dL — ABNORMAL HIGH (ref 70–99)
Glucose-Capillary: 118 mg/dL — ABNORMAL HIGH (ref 70–99)

## 2024-06-22 MED ORDER — SENNOSIDES-DOCUSATE SODIUM 8.6-50 MG PO TABS
1.0000 | ORAL_TABLET | Freq: Once | ORAL | Status: AC
  Administered 2024-06-22: 1 via ORAL
  Filled 2024-06-22: qty 1

## 2024-06-22 MED ORDER — INSULIN GLARGINE-YFGN 100 UNIT/ML ~~LOC~~ SOLN
8.0000 [IU] | Freq: Every day | SUBCUTANEOUS | Status: DC
  Administered 2024-06-22 – 2024-06-23 (×2): 8 [IU] via SUBCUTANEOUS
  Filled 2024-06-22 (×2): qty 0.08

## 2024-06-22 MED ORDER — POLYETHYLENE GLYCOL 3350 17 G PO PACK
17.0000 g | PACK | Freq: Two times a day (BID) | ORAL | Status: DC
  Administered 2024-06-22 – 2024-06-23 (×2): 17 g via ORAL
  Filled 2024-06-22 (×2): qty 1

## 2024-06-22 MED ORDER — LEVETIRACETAM 500 MG PO TABS: 500.0000 mg | ORAL_TABLET | Freq: Two times a day (BID) | ORAL | Status: DC

## 2024-06-22 MED ORDER — TAMSULOSIN HCL 0.4 MG PO CAPS: 0.4000 mg | ORAL_CAPSULE | Freq: Every day | ORAL | Status: AC

## 2024-06-22 MED ORDER — AMLODIPINE BESYLATE 10 MG PO TABS: 10.0000 mg | ORAL_TABLET | Freq: Every day | ORAL | Status: AC

## 2024-06-22 MED ORDER — GLYCERIN (LAXATIVE) 2 G RE SUPP
1.0000 | Freq: Once | RECTAL | Status: AC
  Administered 2024-06-22: 1 via RECTAL
  Filled 2024-06-22: qty 1

## 2024-06-22 MED ORDER — CARVEDILOL 25 MG PO TABS: 12.5000 mg | ORAL_TABLET | Freq: Two times a day (BID) | ORAL | Status: AC

## 2024-06-22 MED ORDER — MELATONIN 3 MG PO TABS: 3.0000 mg | ORAL_TABLET | Freq: Every day | ORAL | Status: AC

## 2024-06-22 MED ORDER — CARVEDILOL 12.5 MG PO TABS
12.5000 mg | ORAL_TABLET | Freq: Two times a day (BID) | ORAL | Status: DC
  Administered 2024-06-22 – 2024-06-23 (×3): 12.5 mg via ORAL
  Filled 2024-06-22 (×3): qty 1

## 2024-06-22 NOTE — Discharge Summary (Signed)
 "  Name: Justin Salinas MRN: [id number] DOB: 02/14/1941 84 y.o. PCP: Justin Worth HERO, MD  Date of Admission: 06/17/2024  6:02 PM Date of Discharge: 06/23/2024 Attending Physician: Dr. Jone Salinas  Discharge Diagnosis: 1. Principal Problem:   Acute encephalopathy Active Problems:   Controlled type 2 diabetes mellitus, with long-term current use of insulin  (HCC)   Essential hypertension   GERD   BPH (benign prostatic hyperplasia)   Hypertension   Recurrent falls   Cognitive decline   Dementia with behavioral disturbance (HCC)   SDH (subdural hematoma) (HCC)   Discharge Medications: Allergies as of 06/23/2024       Reactions   Exenatide Other (See Comments)   Exenatide, sold under the brand name Byetta among others, is a medication used to treat type 2 diabetes. Unknown reaction   Linagliptin  Other (See Comments)   Tradjenta  (linagliptin ) is a brand-name tablet prescribed for type 2 diabetes. Unknown reaction   Lasix  [furosemide ] Other (See Comments)   Constipation        Medication List     STOP taking these medications    alfuzosin  10 MG 24 hr tablet Commonly known as: UROXATRAL    nitrofurantoin  (macrocrystal-monohydrate) 100 MG capsule Commonly known as: MACROBID        TAKE these medications    acetaminophen  325 MG tablet Commonly known as: TYLENOL  Take 650 mg by mouth every 6 (six) hours as needed for moderate pain (pain score 4-6).   amLODipine  10 MG tablet Commonly known as: NORVASC  Take 1 tablet (10 mg total) by mouth daily.   antiseptic oral rinse Liqd 15 mLs by Mouth Rinse route 4 (four) times daily as needed for dry mouth.   aspirin  81 MG tablet Take 81 mg by mouth every morning.   carvedilol  25 MG tablet Commonly known as: COREG  Take 0.5 tablets (12.5 mg total) by mouth 2 (two) times daily. Hold if SBP<105 or HR <60 What changed: how much to take   cloNIDine  0.1 mg/24hr patch Commonly known as: CATAPRES  - Dosed in mg/24 hr Place 0.1  mg onto the skin every Friday.   DEX4 POUCH PACK PO Take 15 g by mouth daily as needed (hypoglycemia).   finasteride  5 MG tablet Commonly known as: PROSCAR  Take 5 mg by mouth daily.   fluticasone 50 MCG/ACT nasal spray Commonly known as: FLONASE Place 1 spray into both nostrils daily.   HumaLOG KwikPen 100 UNIT/ML KwikPen Generic drug: insulin  lispro Inject 0-10 Units into the skin See admin instructions. Take 3 times daily per sliding scale: If BS<70 Call MD,  IF BS is 71-150=0 units,  151-200=2 units,  201-250=4 units,  251-300=6 units,  301-350=8 units,  351-400=10 units,  BS>400 Call MD   Lantus  SoloStar 100 UNIT/ML Solostar Pen Generic drug: insulin  glargine Inject 8 Units into the skin at bedtime.   levETIRAcetam  500 MG tablet Commonly known as: KEPPRA  Take 1 tablet (500 mg total) by mouth 2 (two) times daily. Patient to stop taking on 1/21 (after having completed 7 days of Keppra )   loratadine 10 MG tablet Commonly known as: CLARITIN Take 10 mg by mouth daily.   melatonin 3 MG Tabs tablet Take 1 tablet (3 mg total) by mouth at bedtime.   polyethylene glycol 17 g packet Commonly known as: MIRALAX  / GLYCOLAX  Take 17 g by mouth daily as needed for moderate constipation.   QUEtiapine  25 MG tablet Commonly known as: SEROquel  Take 1 tablet (25 mg total) by mouth at bedtime.   repaglinide   0.5 MG tablet Commonly known as: PRANDIN  Take 0.5 mg by mouth 3 (three) times daily before meals. 15-30 min. Before each meal   rosuvastatin  20 MG tablet Commonly known as: CRESTOR  Take 1 tablet (20 mg total) by mouth daily.   senna 8.6 MG Tabs tablet Commonly known as: SENOKOT Take 1 tablet by mouth every evening.   sertraline  100 MG tablet Commonly known as: ZOLOFT  Take 100 mg by mouth daily.   spironolactone  25 MG tablet Commonly known as: ALDACTONE  Take 25 mg by mouth daily.   tamsulosin  0.4 MG Caps capsule Commonly known as: FLOMAX  Take 1 capsule (0.4 mg  total) by mouth daily after supper.   vitamin C 1000 MG tablet Take 1,000 mg by mouth daily.   Vitamin D3 Super Strength 50 MCG (2000 UT) tablet Generic drug: Cholecalciferol Take 1 tablet by mouth daily.         Disposition and follow-up:   Justin Salinas was discharged from [facility] in Good condition.  At the hospital follow up visit please address:  1.   -Dementia: curbside with neurology during hospitalization recommended continued dc of risperidone  and continuation of seroquel  25 mg nightly. Patient did well on this during hospitalization in addition to home sertraline  100 mg, melatonin nightly, and delirium precautions. Neurology also recommended possible start of cinamet. Could consider outpatient with referral / workup from neurology. Overall, appeared closer to baseline at discharge to SNF: pleasant, cooperative, alert and oriented to self, location (with prompting).  -SDH: stable on repeat imaging. Surgery not recommended. Started on pharmacologic DVT prior to dc, continue ASA 81 mg outpatient.  -Concern for seizure: patient had negative EEG. Treated with keppra . Dc on 1/21.  -HTN: consider increase in coreg  to 25 mg BID if not well controlled outpatient.  -BPH - switched to tamsulosin  0.4 mg (from alfuzosin )  2.  Labs / imaging needed at time of follow-up: n/a  3.  Pending labs/ test needing follow-up: n/a  Follow-up Appointments:  Follow-up Information     Justin Worth HERO, MD Follow up.   Specialty: Family Medicine Contact information: [address] [place] [address] [phone]                  Hospital Course by problem list: Justin Salinas is a 84 y.o. Justin Salinas is a 84 y.o. male residing in assisted living facility, with past medical history of dementia, CAD s/p CABG, hypertension, type 2 diabetes, CKD stage III, chronic diastolic heart failure who presents with acute agitation, altered mental status,  reported seizure-like activity and admitted for encephalopathy workup and 4mm right parafalcine SHD now being discharged on hospital day 0 with the following pertinent hospital course:  #Acute Encephalopathy  #Agitation #Acute SDH, 4 mm right parafalcine (mBIG 1) Recent discharge and treatment for UTI 1/12.  Changes to home medication regiment at last hospitalization: previously on risperidone  for 6 months but was discontinued due to concerns for NMS and cogwheel rigidity. After discharge had 2 episodes of fall, one episode on 1/13 reported from staff at assisted living facility. This was day Seroquel  was started. On 1/14 is had another fall and staff reported seizure like activities. Denied head trauma x2.   ED CT scan showed 4 mm SDH. Neurosurgery consulted and did not recommend surgery. Repeat CT: Stable small right peripheral subdermal hematoma measuring approximately 4 mm without mass effect.  Moderate generalized volume loss and mild to moderate cerebral white matter disease.  Moderate chronic  appearing mucosal disease with left maxillary sinus.  UDS negative. TSH, B1: NL in prior admission. Encephalopathy differential diagnosis included: SDH vs dopaminergic rebound with cessation of risperidone  vs seizure versus progressive dementia. Curbside consult with neurology recommended continued cessation of risperidone  given suspicion of parkinsonian features. Recommended continuing Seroquel  25 mg nightly and consider Cinamet. Avoided haloperidol /risperidone  with suspicion of parkinsonism/LBD and benzos. Per neurology, technically difficult EEG study but suggestive of generalized cerebral dysfunction. No seizures or epileptiform changes seen throughout recording. Loaded with Keppra  and continued on Keppra  500 mg BID for 7 days. Palliative care consulted for conversation with daughter and family who agreed based on decline since April 2025 pneumonia, DRN-limited with dc to SNF at ALF given clinical improvement.  MOST form filled.   Delirium precautions placed during hospitalization. Benzos avoided. Melatonin given nightly. ASA and anticoagulation held initially in setting of SHD, SBP goal <160. Continued sertraline  100 mg and Seroquel  25 mg nightly and will continue on discharge. Patient was discharged having spent several days around baseline (pleasant, cooperative, alert and oriented to self, occasionally location with options) and having worked with with physical therapy.    #Syncope vs seizure vs mechanical fall Mixed story; transient unilateral limpness reported; subdural could be traumatic or incidental but treat as traumatic until proven otherwise.  EEG unremarkable for seizure.  Electrolytes WNL. Given Keppra  loading dose and then started on Keppra  500 mg BID for 7 days. No signs of seizure or fall while hospitalized. Patient worked well with physical therapy walking with assistance.    #Recent UTI (E. coli), resolved  BPH Finished course of antibiotics.  Urinalysis this admission unremarkable.  switched to tamsulosin  0.4 mg (from alfuzosin ).   #HTN / CAD / HFpEF Goal SBP <160. Restarted on amlodipine  10 mg,  coreg  12.5 mg BID (home dose 25 mg BID), spironolactone  25 mg daily, hydralazine  25 mg q 8 hours PRN, Crestor  20 mg daily.   #DM2 / RXI6a Started back on home dose insulin  prior to discharge. Glucose monitored throughout hospitalization.   # VTE prophylaxis Small SDH, Avoided chemical VTE ppx initially. Started on enoxaparin  40 mg.    Subjective Assessed patient bedside. Alert to name. Could not state year and stated month was February (even when presented with options). States he is in no pain. No current complaints or concerns. Had BM this AM.   Discharge Exam:   BP 117/72 (BP Location: Left Arm)   Pulse 84   Temp 97.8 F (36.6 C)   Resp 16   SpO2 100%  Discharge exam:  Physical Exam Constitutional:      General: He is not in acute distress.    Appearance: He is not  ill-appearing or toxic-appearing.  Cardiovascular:     Rate and Rhythm: Normal rate and regular rhythm.     Heart sounds: No murmur heard.    No friction rub. No gallop.  Pulmonary:     Effort: Pulmonary effort is normal.     Breath sounds: Normal breath sounds. No stridor. No wheezing, rhonchi or rales.     Comments: Anterior lungs Abdominal:     General: Bowel sounds are normal.     Palpations: Abdomen is soft.     Tenderness: There is no guarding.  Musculoskeletal:     Right lower leg: No edema.     Left lower leg: No edema.  Skin:    General: Skin is warm and dry.  Neurological:     Mental Status: He is alert. He is  disoriented.  Psychiatric:        Attention and Perception: Attention normal.        Mood and Affect: Mood normal.        Behavior: Behavior is cooperative.   Pertinent Labs, Studies, and Procedures:     Latest Ref Rng & Units 06/20/2024    6:03 AM 06/19/2024    2:31 AM 06/18/2024    2:39 AM  CBC  WBC 4.0 - 10.5 K/uL 8.2  7.5  7.5   Hemoglobin 13.0 - 17.0 g/dL 86.9  87.8  88.5   Hematocrit 39.0 - 52.0 % 39.4  36.6  35.2   Platelets 150 - 400 K/uL 196  196  178        Latest Ref Rng & Units 06/20/2024    6:03 AM 06/19/2024    2:31 AM 06/18/2024    2:39 AM  CMP  Glucose 70 - 99 mg/dL 849  859  808   BUN 8 - 23 mg/dL 36  34  44   Creatinine 0.61 - 1.24 mg/dL 8.54  8.51  8.58   Sodium 135 - 145 mmol/L 139  139  137   Potassium 3.5 - 5.1 mmol/L 4.2  4.2  3.9   Chloride 98 - 111 mmol/L 106  104  102   CO2 22 - 32 mmol/L 20  26  25    Calcium  8.9 - 10.3 mg/dL 9.5  9.3  9.4   Total Protein 6.5 - 8.1 g/dL   6.7   Total Bilirubin 0.0 - 1.2 mg/dL   0.4   Alkaline Phos 38 - 126 U/L   76   AST 15 - 41 U/L   25   ALT 0 - 44 U/L   20     EEG adult Result Date: 06/18/2024 Shelton Arlin KIDD, MD     06/18/2024  8:55 AM Patient Name: Mann Skaggs Barbero MRN: [id number] Epilepsy Attending: Arlin KIDD Shelton Referring Physician/Provider: Ruthe Cornet, DO Date: 06/18/2024  Duration: 21.16 mins Patient history:  84 y.o. presenting 1/14 after shower event concerning for syncope vs seizure with agitation and CT showing small acute 4 mm right parafalcine SDH (mBIG 1) without mass effect. EEG to evaluate for seizure Level of alertness: Awake AEDs during EEG study: LEV Technical aspects: This EEG study was done with scalp electrodes positioned according to the 10-20 International system of electrode placement. Electrical activity was reviewed with band pass filter of 1-70Hz , sensitivity of 7 uV/mm, display speed of 34mm/sec with a 60Hz  notched filter applied as appropriate. EEG data were recorded continuously and digitally stored.  Video monitoring was available and reviewed as appropriate. Description: EEG showed continuous generalized 3 to 6 Hz theta-delta slowing. Hyperventilation and photic stimulation were not performed.   Of note study was technically difficult due to significant myogenic and electrode artifact ABNORMALITY - Continuous slow, generalized IMPRESSION: This technically difficult study is suggestive of generalized cerebral dysfunction (encephalopathy). No seizures or epileptiform discharges were seen throughout the recording. Priyanka KIDD Shelton   CT HEAD WO CONTRAST ( ) Result Date: 06/18/2024 EXAM: CT HEAD WITHOUT CONTRAST 06/18/2024 07:07:00 AM TECHNIQUE: CT of the head was performed without the administration of intravenous contrast. Automated exposure control, iterative reconstruction, and/or weight based adjustment of the mA/kV was utilized to reduce the radiation dose to as low as reasonably achievable. COMPARISON: CT of the head dated 06/17/2024. CLINICAL HISTORY: F/u imaging for SDH seen on CT head 1/14. FINDINGS: BRAIN AND VENTRICLES: A small right peripheral subdural  hematoma is again demonstrated and is unchanged in the interim, again measuring approximately 4 mm. There is no mass effect upon the brain parenchyma. There is moderate generalized volume loss  present. There is mild-to-moderate cerebral white matter disease. No acute hemorrhage. No evidence of acute infarct. No hydrocephalus. No midline shift. ORBITS: The patient is status post bilateral lens replacement. No acute abnormality. SINUSES: There is moderate chronic-appearing mucosal disease within the left maxillary sinus. SOFT TISSUES AND SKULL: No acute soft tissue abnormality. No skull fracture. IMPRESSION: 1. Stable small right peripheral subdural hematoma, measuring approximately 4 mm, without mass effect, unchanged from 06/17/2024. 2. Moderate generalized volume loss and mild-to-moderate cerebral white matter disease. 3. Moderate chronic-appearing mucosal disease within the left maxillary sinus. Electronically signed by: Evalene Coho MD 06/18/2024 07:31 AM EST RP Workstation: [id number]   CT Cervical Spine Wo Contrast Result Date: 06/17/2024 EXAM: CT CERVICAL SPINE WITHOUT CONTRAST 06/17/2024 06:58:04 PM TECHNIQUE: CT of the cervical spine was performed without the administration of intravenous contrast. Multiplanar reformatted images are provided for review. The examination is mildly limited due to patient motion artifact. Automated exposure control, iterative reconstruction, and/or weight based adjustment of the mA/kV was utilized to reduce the radiation dose to as low as reasonably achievable. COMPARISON: None available. CLINICAL HISTORY: Recent seizure activity with neck pain, initial encounter FINDINGS: BONES AND ALIGNMENT: Seven cervical segments are well visualized. Vertebral body height is well maintained. A posterior fusion defect is noted at C1. No acute fracture or acute facet abnormality is noted. No traumatic malalignment. DEGENERATIVE CHANGES: Multilevel disc space narrowing is noted from C4 to C7 with associated osteophytic changes. Facet hypertrophic changes are noted. SOFT TISSUES: Surrounding soft tissue structures are within normal limits. LUNG APICES: Visualized lung apices  show no pneumothorax. A somewhat nodular density is noted in the right upper lobe, best seen on image number 94 of series 5, measuring up to 7 mm. IMPRESSION: 1. No acute fracture or acute facet abnormality. 2. Right upper lobe pulmonary nodule measuring up to 7 mm. Per Fleischner Society Guidelines recommend a non-contrast chest CT at 6-12 months; if patient is high risk for malignancy, consider an additional non-contrast chest CT at 18-24 months. If patient is low risk for malignancy, non-contrast chest CT at 18-24 months is optional. Electronically signed by: Oneil Devonshire MD 06/17/2024 07:09 PM EST RP Workstation: [id number]   DG Chest Portable 1 View Result Date: 06/17/2024 CLINICAL DATA:  Fall EXAM: PORTABLE CHEST 1 VIEW COMPARISON:  06/13/2024, 11/23/2023 FINDINGS: Post sternotomy changes. No focal opacity, pleural effusion or pneumothorax. Normal cardiomediastinal silhouette with aortic atherosclerosis IMPRESSION: No active disease. Electronically Signed   By: Luke Bun M.D.   On: 06/17/2024 19:06   CT Head Wo Contrast Result Date: 06/17/2024 EXAM: CT HEAD WITHOUT CONTRAST 06/17/2024 06:58:04 PM TECHNIQUE: CT of the head was performed without the administration of intravenous contrast. Automated exposure control, iterative reconstruction, and/or weight based adjustment of the mA/kV was utilized to reduce the radiation dose to as low as reasonably achievable. COMPARISON: 07/23/2022 CLINICAL HISTORY: Polytrauma, blunt. FINDINGS: BRAIN AND VENTRICLES: Acute 4 mm right parafalcine subdural hemorrhage (mBIG 1). Patchy white matter hypodensities, compatible with chronic microvascular ischemic disease. Cerebral atrophy. Remote left cerebellar infarct. Calcific atherosclerosis. No evidence of acute infarct. No mass effect or midline shift. ORBITS: No acute abnormality. SINUSES: Chronic left maxillary mucosal thickening. SOFT TISSUES AND SKULL: No acute soft tissue abnormality. No skull fracture (mBIG 1).  Traumatic Brain Injury Risk Stratification ----- Skull Fracture: No (  Low - mBIG 1) Subdural Hematoma (SDH): 4 mm (mBIG 1) Subarachnoid Hemorrhage [facility]): No (Low) Epidural Hematoma (EDH): No (Low - mBIG 1) Cerebral contusion, intra-axial, intraparenchymal Hemorrhage (IPH): No (Low - mBIG 1) Intraventricular Hemorrhage (IVH): No (Low - mBIG 1) Midline Shift 1mm or Edema/effacement of sulci/vents: No (Low - mBIG 1) IMPRESSION: 1. Acute 4 mm right parafalcine subdural hemorrhage (mBIG 1). 2. TBI risk stratification is Low - mBIG 1. Critical findings were discussed with Dr. Ruthe at 7:04 pm EST on 06/17/2024 Electronically signed by: Oneil Devonshire MD 06/17/2024 07:05 PM EST RP Workstation: [id number]     Discharge Instructions: Discharge Instructions     Call MD for:  difficulty breathing, headache or visual disturbances   Complete by: As directed    Call MD for:  difficulty breathing, headache or visual disturbances   Complete by: As directed    Call MD for:  extreme fatigue   Complete by: As directed    Call MD for:  extreme fatigue   Complete by: As directed    Call MD for:  hives   Complete by: As directed    Call MD for:  hives   Complete by: As directed    Call MD for:  persistant dizziness or light-headedness   Complete by: As directed    Call MD for:  persistant dizziness or light-headedness   Complete by: As directed    Call MD for:  persistant nausea and vomiting   Complete by: As directed    Call MD for:  persistant nausea and vomiting   Complete by: As directed    Call MD for:  severe uncontrolled pain   Complete by: As directed    Call MD for:  severe uncontrolled pain   Complete by: As directed    Call MD for:  temperature >100.4   Complete by: As directed    Call MD for:  temperature >100.4   Complete by: As directed    Discharge instructions   Complete by: As directed    Thank you for allowing us  to be part of your care. You were hospitalized for altered mental status  and acute subdural hemorrhage 4 mm right para falcine. Repeat scanning did not show worsening of the bleed. You were treated with anti-seizure medication that will continue for a total treatment time of 7 days. No evidence of seizure was found during hospitalization. We continued the medication Seroquel  25 mg nightly after discussion with the neurologist who also recommended stopping previous home medication and avoiding medications that can worsen possible parkinsonism/LBD. You did well throughout hospitalization.   See the changes in your medications and management of your chronic conditions below:  *For your Dementia  -We have STARTED you on these following medications:  - Seroquel  25 mg nightly  - sertraline  100 mg daily   - melatonin nightly   - avoid haloperidol /risperidone /benzodiazepine medicines               - continue Keppra  500 mg twice daily until 1/21  *For your High Blood Pressure and coronary artery disease.   - amlodipine  10 mg              - coreg  12.5 mg BID (home dose 25 mg BID)             - spironolactone  25 mg daily              - hydralazine  25 mg q 8 hours as needed for SBP >160              -  Crestor  20 mg daily  *For your BPH             - we have switched you to tamsulosin  0.4 mg (from alfuzosin ) as it targets more the prostate and has less effect on blood pressure  We hope you continue working well with physical therapy and have a productive time while at the Skilled Nursing Facility. I have written again in my discharge summary to consider further neurologic workup, otherwise, speak with your primary care doctor regarding management.   Please call your PCP or our clinic if you have any questions or concerns, we may be able to help and keep you from a long and expensive emergency room wait. Our clinic and after hours phone number is [phone]. The best time to call is Monday through Friday 9 am to 4 pm but there is always someone available 24/7 if you have an  emergency. If you need medication refills please notify your pharmacy one week in advance and they will send us  a request.   We are glad you are feeling better,  Sallyanne Primas Internal Medicine Inpatient Teaching Service at [facility]   Discharge instructions   Complete by: As directed    Thank you for allowing us  to be part of your care. You were hospitalized for altered mental status and acute subdural hemorrhage 4 mm right para falcine. Repeat scanning did not show worsening of the bleed. You were treated with anti-seizure medication that will continue for a total treatment time of 7 days. No evidence of seizure was found during hospitalization. We continued the medication Seroquel  25 mg nightly after discussion with the neurologist who also recommended stopping previous home medication and avoiding medications that can worsen possible parkinsonism/LBD. You did well throughout hospitalization.   See the changes in your medications and management of your chronic conditions below:  *For your Dementia  -We have STARTED you on these following medications:  - Seroquel  25 mg nightly  - sertraline  100 mg daily   - melatonin nightly   - avoid haloperidol /risperidone /benzodiazepine medicines               - continue Keppra  500 mg twice daily until 1/21  *For your High Blood Pressure and coronary artery disease.   - amlodipine  10 mg              - coreg  12.5 mg BID (home dose 25 mg BID)             - spironolactone  25 mg daily              - hydralazine  25 mg q 8 hours as needed for SBP >160              - Crestor  20 mg daily  *For your BPH             - we have switched you to tamsulosin  0.4 mg (from alfuzosin ) as it targets more the prostate and has less effect on blood pressure  We hope you continue working well with physical therapy and have a productive time while at the Skilled Nursing Facility. I have written again in my discharge summary to consider further neurologic workup,  otherwise, speak with your primary care doctor regarding management.   Please call your PCP or our clinic if you have any questions or concerns, we may be able to help and keep you from a long and expensive emergency room wait. Our clinic and after hours phone number  is [phone]. The best time to call is Monday through Friday 9 am to 4 pm but there is always someone available 24/7 if you have an emergency. If you need medication refills please notify your pharmacy one week in advance and they will send us  a request.   We are glad you are feeling better,  Sallyanne Primas Internal Medicine Inpatient Teaching Service at [facility]   Increase activity slowly   Complete by: As directed    Increase activity slowly   Complete by: As directed        Signed: Lafern Brinkley, DO 06/23/2024, 11:03 AM     "

## 2024-06-22 NOTE — Progress Notes (Signed)
 Florence Harvey, LPN at [facility] to give report, and per nurse, patient must have a bowel movement before he can discharge to the facility. Almarie Goodie, LCSW and Dr.Rhiner made aware.

## 2024-06-22 NOTE — Discharge Instructions (Signed)
 Thank you for allowing us  to be part of your care. You were hospitalized for altered mental status and acute subdural hemorrhage 4 mm right para falcine. Repeat scanning did not show worsening of the bleed. You were treated with anti-seizure medication that will continue for a total treatment time of 7 days. No evidence of seizure was found during hospitalization. We continued the medication Seroquel  25 mg nightly after discussion with the neurologist who also recommended stopping previous home medication and avoiding medications that can worsen possible parkinsonism/LBD. You did well throughout hospitalization.   See the changes in your medications and management of your chronic conditions below:  *For your Dementia  -We have STARTED you on these following medications:  - Seroquel  25 mg nightly  - sertraline  100 mg daily   - melatonin nightly   - avoid haloperidol /risperidone /benzodiazepine medicines               - continue Keppra  500 mg twice daily until 1/21  *For your High Blood Pressure and coronary artery disease.   - amlodipine  10 mg              - coreg  12.5 mg BID (home dose 25 mg BID)             - spironolactone  25 mg daily              - hydralazine  25 mg q 8 hours as needed for SBP >160              - Crestor  20 mg daily  *For your BPH             - we have switched you to tamsulosin  0.4 mg (from alfuzosin ) as it targets more the prostate and has less effect on blood pressure  We hope you continue working well with physical therapy and have a productive time while at the Skilled Nursing Facility. I have written again in my discharge summary to consider further neurologic workup, otherwise, speak with your primary care doctor regarding management.   Please call your PCP or our clinic if you have any questions or concerns, we may be able to help and keep you from a long and expensive emergency room wait. Our clinic and after hours phone number is [phone]. The best time to  call is Monday through Friday 9 am to 4 pm but there is always someone available 24/7 if you have an emergency. If you need medication refills please notify your pharmacy one week in advance and they will send us  a request.   We are glad you are feeling better,  Sallyanne Primas Internal Medicine Inpatient Teaching Service at [facility]

## 2024-06-22 NOTE — Progress Notes (Signed)
 "  HD#0 SUBJECTIVE:  Patient Summary: Justin Salinas is a 84 y.o. male residing in assisted living facility, with past medical history of dementia, CAD s/p CABG, hypertension, type 2 diabetes, CKD stage III, chronic diastolic heart failure who presents with acute agitation, altered mental status, reported seizure-like activity and admitted for encephalopathy workup and SDH.   Recent discharge and treatment for UTI 1/12.  Changes to home medication regiment at last hospitalization: previously on risperidone  for 6 months but was discontinued due to concerns for NMS and cogwheel rigidity. After discharge had 2 episodes of fall, one episode on 1/13 reported from staff at assisted living facility. This was day Seroquel  was started. On 1/14 is had another fall and staff reported seizure like activities. Denied head trauma x2. ED CT scan showed 4 mm SDH. UDS negative. TSH, B1: NL in prior admission.   Overnight Events: no overnight events.   Interim History: Assessed patient bedside. Alert to name and with options was oriented to location. Could not state year and stated month was December (even when presented with options). States he is in no pain. No current complaints or concerns.   OBJECTIVE:  Vital Signs: Vitals:   06/21/24 2017 06/22/24 0430 06/22/24 0500 06/22/24 0833  BP: (!) 159/63 (!) 171/74 (!) 162/93 (!) 163/64  Pulse: 71 72 70 66  Resp: 18 18 20 18   Temp: 97.7 F (36.5 C) 97.9 F (36.6 C)  98.2 F (36.8 C)  TempSrc: Oral Oral  Axillary  SpO2: 99% 98%  98%   Supplemental O2: Room Air SpO2: 98 %  There were no vitals filed for this visit.   Intake/Output Summary (Last 24 hours) at 06/22/2024 1129 Last data filed at 06/22/2024 0504 Gross per 24 hour  Intake 540 ml  Output 700 ml  Net -160 ml   Net IO Since Admission: -820 mL [06/22/24 1129]  Physical Exam: Physical Exam Constitutional:      General: He is not in acute distress.    Appearance: He is not ill-appearing  or toxic-appearing.  Cardiovascular:     Rate and Rhythm: Normal rate and regular rhythm.     Heart sounds: No murmur heard.    No friction rub. No gallop.  Pulmonary:     Effort: Pulmonary effort is normal.     Breath sounds: Normal breath sounds. No stridor. No wheezing, rhonchi or rales.     Comments: Anterior lungs Abdominal:     General: Bowel sounds are normal.     Palpations: Abdomen is soft.     Tenderness: There is no guarding.  Musculoskeletal:     Right lower leg: No edema.     Left lower leg: No edema.  Skin:    General: Skin is warm and dry.  Neurological:     Mental Status: He is alert. He is disoriented.  Psychiatric:        Attention and Perception: Attention normal.        Mood and Affect: Mood normal.        Behavior: Behavior is cooperative.     Patient Lines/Drains/Airways Status     Active Line/Drains/Airways     Name Placement date Placement time Site Days   External Urinary Catheter 06/18/24  1744  --  4            Pertinent labs and imaging:      Latest Ref Rng & Units 06/20/2024    6:03 AM 06/19/2024    2:31 AM 06/18/2024  2:39 AM  CBC  WBC 4.0 - 10.5 K/uL 8.2  7.5  7.5   Hemoglobin 13.0 - 17.0 g/dL 86.9  87.8  88.5   Hematocrit 39.0 - 52.0 % 39.4  36.6  35.2   Platelets 150 - 400 K/uL 196  196  178        Latest Ref Rng & Units 06/20/2024    6:03 AM 06/19/2024    2:31 AM 06/18/2024    2:39 AM  CMP  Glucose 70 - 99 mg/dL 849  859  808   BUN 8 - 23 mg/dL 36  34  44   Creatinine 0.61 - 1.24 mg/dL 8.54  8.51  8.58   Sodium 135 - 145 mmol/L 139  139  137   Potassium 3.5 - 5.1 mmol/L 4.2  4.2  3.9   Chloride 98 - 111 mmol/L 106  104  102   CO2 22 - 32 mmol/L 20  26  25    Calcium  8.9 - 10.3 mg/dL 9.5  9.3  9.4   Total Protein 6.5 - 8.1 g/dL   6.7   Total Bilirubin 0.0 - 1.2 mg/dL   0.4   Alkaline Phos 38 - 126 U/L   76   AST 15 - 41 U/L   25   ALT 0 - 44 U/L   20     No results found.   ASSESSMENT/PLAN:   Assessment: Principal Problem:   Acute encephalopathy Active Problems:   Controlled type 2 diabetes mellitus, with long-term current use of insulin  (HCC)   Essential hypertension   GERD   BPH (benign prostatic hyperplasia)   Hypertension   Recurrent falls   Cognitive decline   Dementia with behavioral disturbance (HCC)   SDH (subdural hematoma) (HCC)   Plan: #Acute Encephalopathy  #Acute SDH, 4 mm right parafalcine (mBIG 1) Patient remains comfortable and pleasantly confused. Working well with PT and OT. Diet advanced. Walked 73ft Mod A x 2. Will dc to SNF.  -Medications:  - Sertraline  100 mg - Seroquel  25 mg nightly  - Avoid haloperidol /risperidone  if Parkinsonism/LBD suspected. - melatonin nightly  - Avoid benzos if possible (Ativan  may worsen delirium/resp depression) - Hold aspirin  and anticoagulation  - consider cinamet  - Delirium bundle: pain/constipation/urinary retention, sleep/wake, reorientation, minimize restraints when possible. - Fall precautions - Palliative consulted: DNR-Limited, dc to SNF at ALF.   #Syncope vs seizure vs mechanical fall Mixed story; transient unilateral limpness reported; subdural could be traumatic or incidental but treat as traumatic until proven otherwise.  EEG unremarkable for seizure.  Electrolytes WNL. - Keppra  500 mg BID (dose adjust if renal function worsens). Consider stopping after 7 days (started on 1/15).    #Recent UTI (E. coli) - resolved   BPH - Repeat UA/UC if fever/leukocytosis/urinary symptoms or unclear baseline. - Bladder scan/PVR; treat retention if present. - finasteride  5 mg daily   #HTN / CAD / HFpEF Goal SBP <160 in setting of SDH. Avoid hypotension  - amlodipine  10 mg ordered - coreg  12.5 mg BID (home dose 25 mg BID) - spironolactone  25 mg daily ordered - hydralazine  25 mg q 8 hours PRN ordered  - Crestor  20 mg daily   #DM2 / CKD3b - Sliding Scale Insulin  - Restarted LA insulin  8 units   # VTE  prophylaxis Small SDH   - enoxaparin  40 mg daily  - Mechanical prophylaxis now.  Best Practice: Diet: Regular diet VTE: enoxaparin  (LOVENOX ) injection 40 mg Start: 06/20/24 1330 Code: DNR-limited  Disposition planning: Therapy Recs: Pending, DME: other recommendations Family Contact: daughter.  DISPO: Anticipated discharge pending clinical improvement to SNF at previous assisted living facility  Signature:  Darry Kelnhofer [facility]  11:29 AM, 06/22/2024  On Call pager [phone]  "

## 2024-06-22 NOTE — NC FL2 (Signed)
 " [place]  MEDICAID FL2 LEVEL OF CARE FORM     IDENTIFICATION  Patient Name: Justin Salinas Birthdate: January 06, 1941 Sex: male Admission Date (Current Location): 06/17/2024  [facility] and [place] Number:  Producer, Television/film/video and Address:  [facility] [place]. [facility], [address], [place], [place] [address]      Provider Number: [id number]  Attending Physician Name and Address:  Shawn Sick, MD  Relative Name and Phone Number:       Current Level of Care: Hospital Recommended Level of Care: Skilled Nursing Facility Prior Approval Number:    Date Approved/Denied:   PASRR Number:    Discharge Plan: SNF    Current Diagnoses: Patient Active Problem List   Diagnosis Date Noted   SDH (subdural hematoma) (HCC) 06/18/2024   Acute encephalopathy 06/17/2024   Encephalopathy 06/13/2024   Nystagmus 06/13/2024   UTI (urinary tract infection) 06/13/2024   Acute kidney injury superimposed on stage 4 chronic kidney disease (HCC) 11/23/2023   Chronic diastolic CHF (congestive heart failure) (HCC) 11/23/2023   Macrocytic anemia 11/23/2023   Dementia with behavioral disturbance (HCC) 11/23/2023   AKI (acute kidney injury) 09/25/2023   Hyperglycemia 07/24/2022   Recurrent falls 07/23/2022   Cognitive decline 07/23/2022   Lumbar radiculopathy 09/23/2020   Atherosclerosis of abdominal aorta 02/26/2020   Status post placement of bone anchored hearing aid (BAHA) 05/09/2019   Asymmetric SNHL (sensorineural hearing loss) 04/02/2019   Iron deficiency anemia 10/22/2017   Type 2 diabetes mellitus with hyperglycemia (HCC) 01/03/2017   PVD (posterior vitreous detachment), left eye 02/16/2016   Vitreous syneresis of right eye 02/16/2016   Diastolic dysfunction    Hypertension    CKD stage 3 due to type 2 diabetes mellitus (HCC)    S/P CABG x 4 05/21/2014   Bilateral dry eyes 01/20/2014   Nuclear cataract, bilateral 12/10/2013   Astigmatism 01/11/2012   Hypermetropia 01/11/2012    Presbyopia 01/11/2012   Essential hypertension 12/01/2007   Carotid artery disease 12/01/2007   BPH (benign prostatic hyperplasia) 12/01/2007   Controlled type 2 diabetes mellitus, with long-term current use of insulin  (HCC) 11/28/2007   CAD (coronary artery disease) 11/28/2007   GERD 11/28/2007    Orientation RESPIRATION BLADDER Height & Weight     Self  Normal Incontinent Weight:   Height:     BEHAVIORAL SYMPTOMS/MOOD NEUROLOGICAL BOWEL NUTRITION STATUS      Incontinent Diet (see DC summary)  AMBULATORY STATUS COMMUNICATION OF NEEDS Skin   Extensive Assist Verbally Normal                       Personal Care Assistance Level of Assistance  Bathing, Feeding, Dressing Bathing Assistance: Maximum assistance Feeding assistance: Limited assistance Dressing Assistance: Maximum assistance     Functional Limitations Info  Hearing, Sight, Speech Sight Info: Impaired Hearing Info: Impaired Speech Info: Impaired (delayed responses)    SPECIAL CARE FACTORS FREQUENCY  PT (By licensed PT), OT (By licensed OT), Speech therapy     PT Frequency: 5x/wk OT Frequency: 5x/wk     Speech Therapy Frequency: 5x/wk      Contractures Contractures Info: Not present    Additional Factors Info  Code Status, Allergies, Psychotropic, Insulin  Sliding Scale Code Status Info: DNR Allergies Info: Exenatide, Linagliptin , Lasix  (Furosemide ) Psychotropic Info: Seroquel  25mg  daily at bed; Zoloft  100mg  daily Insulin  Sliding Scale Info: see DC summary       Current Medications (06/22/2024):  This is the current hospital active  medication list Current Facility-Administered Medications  Medication Dose Route Frequency Provider Last Rate Last Admin   acetaminophen  (TYLENOL ) tablet 650 mg  650 mg Oral Q6H PRN Azadegan, Maryam, MD   650 mg at 06/22/24 1023   Or   acetaminophen  (TYLENOL ) suppository 650 mg  650 mg Rectal Q6H PRN Azadegan, Maryam, MD       amLODipine  (NORVASC ) tablet 10 mg  10 mg  Oral Daily Rihner, Emilie, DO   10 mg at 06/22/24 1023   carvedilol  (COREG ) tablet 12.5 mg  12.5 mg Oral BID WC Rihner, Emilie, DO   12.5 mg at 06/22/24 1023   cloNIDine  (CATAPRES  - Dosed in mg/24 hr) patch 0.1 mg  0.1 mg Transdermal Q Fri Zheng, Michael, DO   0.1 mg at 06/19/24 1023   enoxaparin  (LOVENOX ) injection 40 mg  40 mg Subcutaneous Q24H Juberg, Christopher, DO   40 mg at 06/21/24 1221   finasteride  (PROSCAR ) tablet 5 mg  5 mg Oral Daily Zheng, Michael, DO   5 mg at 06/22/24 1023   hydrALAZINE  (APRESOLINE ) tablet 50 mg  50 mg Oral Q8H PRN Koomson, Julius, MD   50 mg at 06/22/24 0526   insulin  aspart (novoLOG ) injection 0-5 Units  0-5 Units Subcutaneous QHS Azadegan, Maryam, MD   3 Units at 06/20/24 2204   insulin  aspart (novoLOG ) injection 0-9 Units  0-9 Units Subcutaneous TID WC Azadegan, Maryam, MD   3 Units at 06/22/24 9348   insulin  glargine-yfgn (SEMGLEE ) injection 8 Units  8 Units Subcutaneous Daily Rihner, Emilie, DO   8 Units at 06/22/24 1032   levETIRAcetam  (KEPPRA ) tablet 500 mg  500 mg Oral BID Zheng, Michael, DO   500 mg at 06/22/24 1023   Or   levETIRAcetam  (KEPPRA ) undiluted injection 500 mg  500 mg Intravenous BID Zheng, Michael, DO       melatonin tablet 3 mg  3 mg Oral QHS Rihner, Emilie, DO   3 mg at 06/21/24 2246   polyethylene glycol (MIRALAX  / GLYCOLAX ) packet 17 g  17 g Oral Daily PRN Azadegan, Maryam, MD   17 g at 06/22/24 0526   QUEtiapine  (SEROQUEL ) tablet 25 mg  25 mg Oral QHS Rihner, Emilie, DO   25 mg at 06/21/24 2246   rosuvastatin  (CRESTOR ) tablet 20 mg  20 mg Oral Daily Zheng, Michael, DO   20 mg at 06/22/24 1023   sertraline  (ZOLOFT ) tablet 100 mg  100 mg Oral Daily Zheng, Michael, DO   100 mg at 06/22/24 1023   spironolactone  (ALDACTONE ) tablet 25 mg  25 mg Oral Daily Rihner, Emilie, DO   25 mg at 06/22/24 1023     Discharge Medications: Please see discharge summary for a list of discharge medications.  Relevant Imaging Results:  Relevant Lab  Results:   Additional Information SS#: [id number]  Almarie [name], LCSW     "

## 2024-06-22 NOTE — Evaluation (Signed)
 Occupational Therapy Evaluation Patient Details Name: Justin Salinas MRN: [id number] DOB: 1941-04-16 Today's Date: 06/22/2024   History of Present Illness   84 y.o. male presents to [facility] 06/17/24 from ALF with acute agitation, AMS, and reported seizure-like activity. Two prior falls at facility. CT head showed acute 4mm R parafalcine SDH w/o midline shift.  Prior admit 1/10 with acute encephalopathy. PMH: CAD s/p CABG x4, DMII, HTN, BPH, lumbar radiculopathy, chronic diastolic heart failure, dementia.     Clinical Impressions Pt making excellent progress towards OT goals. Pt initially lethargic but once assisted to EOB with +2, able to maintain alertness throughout session. Pt initially required Min A x 2 to stand with RW but then able to progress gait in room and in hallway using RW with Mod A (+2 close chair follow). Pt requires Min A for various grooming tasks seated EOB and standing at sink with consistent sequencing cues. Anticipate good progress with continued OOB activity. Patient will benefit from continued inpatient follow up therapy, <3 hours/day      If plan is discharge home, recommend the following:   A lot of help with walking and/or transfers;Two people to help with walking and/or transfers;A lot of help with bathing/dressing/bathroom;Assistance with feeding     Functional Status Assessment         Equipment Recommendations   None recommended by OT     Recommendations for Other Services         Precautions/Restrictions   Precautions Precautions: Fall Recall of Precautions/Restrictions: Impaired Precaution/Restrictions Comments: SBP <160 Restrictions Weight Bearing Restrictions Per Provider Order: No     Mobility Bed Mobility Overal bed mobility: Needs Assistance Bed Mobility: Supine to Sit     Supine to sit: Total assist, +2 for physical assistance, +2 for safety/equipment     General bed mobility comments: Total A x 2 to sit EOB due to  lethargy but once up, alert throughout remainder of session    Transfers Overall transfer level: Needs assistance Equipment used: Rolling walker (2 wheels) Transfers: Sit to/from Stand, Bed to chair/wheelchair/BSC Sit to Stand: Min assist, +2 physical assistance, +2 safety/equipment           General transfer comment: Min A x 2 to initially stand but able to progress to just close chair follow with mobility      Balance Overall balance assessment: Needs assistance, History of Falls Sitting-balance support: No upper extremity supported, Feet supported Sitting balance-Leahy Scale: Fair     Standing balance support: Single extremity supported, During functional activity, Bilateral upper extremity supported Standing balance-Leahy Scale: Poor                             ADL either performed or assessed with clinical judgement   ADL Overall ADL's : Needs assistance/impaired Eating/Feeding: Minimal assistance;Sitting Eating/Feeding Details (indicate cue type and reason): assist to bring cup to mouth to drink and assist with straw Grooming: Minimal assistance;Standing;Wash/dry hands;Brushing hair;Wash/dry face Grooming Details (indicate cue type and reason): Min A to brush hair and wash face sitting EOB. Min A to wash hands standing at sink with Mod verbal cues for sequencing. Pt also wiping down sink after task                             Functional mobility during ADLs: Moderate assistance;+2 for safety/equipment;Rolling walker (2 wheels);Cueing for sequencing General ADL Comments: Pt able to  progress mobility to sink using RW and then out in hallway briefly with chair follow. assist for balance and RW navigation w/ cues for stepping inside RW     Vision   Vision Assessment?: No apparent visual deficits     Perception         Praxis         Pertinent Vitals/Pain Pain Assessment Pain Assessment: No/denies pain     Extremity/Trunk Assessment  Upper Extremity Assessment Upper Extremity Assessment: Generalized weakness;Right hand dominant   Lower Extremity Assessment Lower Extremity Assessment: Defer to PT evaluation       Communication Communication Communication: Impaired Factors Affecting Communication: Hearing impaired   Cognition Arousal: Alert, Lethargic Behavior During Therapy: WFL for tasks assessed/performed, Flat affect Cognition: Cognition impaired   Orientation impairments: Place, Time, Situation Awareness: Intellectual awareness impaired, Online awareness impaired Memory impairment (select all impairments): Short-term memory, Working civil service fast streamer, Conservation officer, historic buildings Attention impairment (select first level of impairment): Sustained attention Executive functioning impairment (select all impairments): Initiation, Sequencing, Problem solving OT - Cognition Comments: initally lethargic but more alert with activity, able to follow most one step commands. A&Ox1. very pleasant                 Following commands: Impaired Following commands impaired: Follows one step commands with increased time     Cueing  General Comments   Cueing Techniques: Verbal cues;Gestural cues;Tactile cues      Exercises     Shoulder Instructions      Home Living                                          Prior Functioning/Environment                      OT Problem List:     OT Treatment/Interventions:        OT Goals(Current goals can be found in the care plan section)   Acute Rehab OT Goals OT Goal Formulation: Patient unable to participate in goal setting Time For Goal Achievement: 07/02/24 Potential to Achieve Goals: Fair ADL Goals Pt Will Perform Eating: with set-up;sitting Pt Will Perform Grooming: with contact guard assist;standing Pt Will Perform Lower Body Dressing: with supervision;sit to/from stand Pt Will Transfer to Toilet: with contact guard  assist;ambulating;bedside commode Additional ADL Goal #1: Pt will complete bed mobility with min assist in preparation for ADLs.   OT Frequency:  Min 1X/week    Co-evaluation              AM-PAC OT 6 Clicks Daily Activity     Outcome Measure Help from another person eating meals?: A Little Help from another person taking care of personal grooming?: A Little Help from another person toileting, which includes using toliet, bedpan, or urinal?: Total Help from another person bathing (including washing, rinsing, drying)?: A Lot Help from another person to put on and taking off regular upper body clothing?: A Lot Help from another person to put on and taking off regular lower body clothing?: Total 6 Click Score: 12   End of Session Equipment Utilized During Treatment: Gait belt;Rolling walker (2 wheels) Nurse Communication: Mobility status;Other (comment) (assist for breakfast needed)  Activity Tolerance: Patient tolerated treatment well Patient left: in chair;with call bell/phone within reach;with chair alarm set  OT Visit Diagnosis: Unsteadiness on feet (R26.81);Muscle weakness (generalized) (M62.81);Other symptoms and  signs involving cognitive function                Time: 9044-8978 OT Time Calculation (min): 26 min Charges:  OT General Charges $OT Visit: 1 Visit OT Treatments $Self Care/Home Management : 8-22 mins $Therapeutic Activity: 8-22 mins  Mliss NOVAK, OTR/L Acute Rehab Services Office: [phone]   Mliss Fish 06/22/2024, 10:32 AM

## 2024-06-22 NOTE — TOC Transition Note (Signed)
 Transition of Care [facility]) - Discharge Note   Patient Details  Name: Justin Salinas MRN: [id number] Date of Birth: [birth year]  Transition of Care [facility]) CM/SW Contact:  Almarie [name], LCSW Phone Number: 06/22/2024, 4:22 PM   Clinical Narrative:   CSW spoke with daughter, [name], to confirm plan to admit to [facility] SNF for rehab. CSW coordinated with OT and SLP assigned today, and coordinated with CMA once therapy updates were in for insurance authorization. Patient approved for SNF. [facility] can admit today, daughter in agreement. Transport arranged with [facility] for next available.  Nurse to call report to [phone], Room 26.   UPDATE 4:24 PM: Per RN after report, SNF will need patient to have BM prior to transport. CSW contacted [facility] and placed patient on will call. After patient has a BM, [facility] can be contacted to pick up the patient. CSW to follow.    Final next level of care: Skilled Nursing Facility Barriers to Discharge: Barriers Resolved   Patient Goals and CMS Choice Patient states their goals for this hospitalization and ongoing recovery are:: patient unable to participate in goal setting, not fully oriented CMS Medicare.gov Compare Post Acute Care list provided to:: Patient Represenative (must comment) Choice offered to / list presented to : Adult Children [place] ownership interest in [facility].provided to:: Adult Children    Discharge Placement              Patient chooses bed at: [facility] Patient to be transferred to facility by: [facility] Name of family member notified: Daughter Patient and family notified of of transfer: 06/22/24  Discharge Plan and Services Additional resources added to the After Visit Summary for       Post Acute Care Choice: Home Health                               Social Drivers of Health (SDOH) Interventions SDOH Screenings   Food Insecurity: Patient Unable To Answer (06/18/2024)  Housing:  Unknown (06/18/2024)  Transportation Needs: Patient Unable To Answer (06/18/2024)  Utilities: Patient Unable To Answer (06/18/2024)  Depression (PHQ2-9): Low Risk (12/11/2022)  Financial Resource Strain: Low Risk (12/11/2022)  Physical Activity: Insufficiently Active (12/11/2022)  Social Connections: Patient Unable To Answer (06/18/2024)  Stress: No Stress Concern Present (12/11/2022)  Tobacco Use: High Risk (06/17/2024)     Readmission Risk Interventions     No data to display

## 2024-06-22 NOTE — Progress Notes (Signed)
 Palliative:  HPI: 84 y.o. male  with past medical history of mania, CAD s/p CABG, hypertension, type 2 diabetes, CKD stage III, chronic diastolic heart failure who presents with acute agitation, altered mental status, reported seizure-like activity.  After ED evaluation he was admitted on 06/17/2024 with encephalopathy, SDH, agitation, reported seizure-like activity, and others. Palliative medicine was consulted for GOC conversations.    I met today at Justin Salinas's bedside. No family present. SLP is working with him. Discussed with He continues to do very well and is feeding himself. Plans for upgrade in diet. He is sitting up in recliner. No concerns - he continues to improve. I signed completed MOST form that family left completed at bedside. I discussed with CSW family wishes to pursue SNF rehab at [facility]. She had already received my message from yesterday and is awaiting insurance approval. She has been told they have a bed available for him.   Exam: Awake, alert, appropriate. Baseline confusion/forgetfulness. No distress. Breathing regular, unlabored. Abd soft. Moves all extremities. Appetite good.   Plan: - DNR - MOST: DNR, limited interventions (ok for rehospitalization), determine benefits/limitations of antibiotics, trial IVF, no feeding tube - SNF rehab with hopes of returning to ALF  25 min  Bernarda Kitty, NP Palliative Medicine Team Pager [phone] (Please see [email or web address] for schedule) Team Phone [phone]

## 2024-06-22 NOTE — Progress Notes (Signed)
 Speech Language Pathology Treatment: Dysphagia  Patient Details Name: Justin Salinas MRN: [id number] DOB: 26-Jan-1941 Today's Date: 06/22/2024 Time: 8962-8951 SLP Time Calculation (min) (ACUTE ONLY): 11 min  Assessment / Plan / Recommendation Clinical Impression  Pt seen for ongoing dysphagia management.  Pt seated in bedside chair consuming AM meal independently on arrival.  Mild diffuse oral residue observed.  Pt accepted trials of regular solid and fed himself independently.  Mild diffuse oral residue again noted.  Pt consumed thin liquid by straw with no clinical s/s of aspiration.  Recommend advancing diet texture.  Pt may benefit from monitoring for diet tolerance at next level of care. Pt has no further acute ST needs. SLP will sign off.   Recommend regular texture diet with thin liquids.     HPI HPI: Justin Salinas is a 84 y.o. male residing in assisted living facility who presented 1/14 with acute agitation, altered mental status, reported seizure-like activity. CXR 1/15 with no active disease.  Head CT 1/15: Stable small right peripheral subdural hematoma, measuring approximately 4  mm, without mass effect, unchanged from 06/17/2024.  The patient was recently hospitalized from 1/10-1/05/2025 for acute encephalopathy felt to be multifactorial, including E. coli UTI, fever, and metabolic derangements. During that admission, he was noted to have tremor and cogwheel rigidity, raising concern for extrapyramidal symptoms.  Pt with past medical history of Justin Salinas, CAD s/p CABG, hypertension, type 2 diabetes, CKD stage III, chronic diastolic heart failure.      SLP Plan  Discharge SLP treatment due to (comment);All goals met        Swallow Evaluation Recommendations    See above     Recommendations  Diet recommendations: Regular;Thin liquid Liquids provided via: Cup;Straw Medication Administration:  (As tolerated) Supervision: Patient able to self feed Compensations: Slow  rate;Small sips/bites Postural Changes and/or Swallow Maneuvers: Seated upright 90 degrees                  Oral care BID     Dysphagia, oral phase (R13.11)     Discharge SLP treatment due to (comment);All goals met     Justin FORBES Grippe, MA, CCC-SLP Acute Rehabilitation Services Office: [phone] 06/22/2024, 10:50 AM

## 2024-06-23 DIAGNOSIS — I130 Hypertensive heart and chronic kidney disease with heart failure and stage 1 through stage 4 chronic kidney disease, or unspecified chronic kidney disease: Secondary | ICD-10-CM | POA: Diagnosis not present

## 2024-06-23 DIAGNOSIS — E1122 Type 2 diabetes mellitus with diabetic chronic kidney disease: Secondary | ICD-10-CM | POA: Diagnosis not present

## 2024-06-23 DIAGNOSIS — I5032 Chronic diastolic (congestive) heart failure: Secondary | ICD-10-CM | POA: Diagnosis not present

## 2024-06-23 DIAGNOSIS — G9340 Encephalopathy, unspecified: Secondary | ICD-10-CM | POA: Diagnosis not present

## 2024-06-23 LAB — GLUCOSE, CAPILLARY: Glucose-Capillary: 193 mg/dL — ABNORMAL HIGH (ref 70–99)

## 2024-06-23 MED ORDER — SENNA 8.6 MG PO TABS
2.0000 | ORAL_TABLET | Freq: Every day | ORAL | Status: DC
  Administered 2024-06-23: 17.2 mg via ORAL
  Filled 2024-06-23: qty 2

## 2024-06-23 MED ORDER — SENNA 8.6 MG PO TABS: 2.0000 | ORAL_TABLET | Freq: Every day | ORAL | Status: DC

## 2024-06-23 MED ORDER — BISACODYL 10 MG RE SUPP: 10.0000 mg | Freq: Every day | RECTAL | Status: DC | PRN

## 2024-06-23 MED ORDER — FLEET ENEMA RE ENEM: 1.0000 | ENEMA | Freq: Once | RECTAL | Status: DC

## 2024-06-23 MED ORDER — BISACODYL 10 MG RE SUPP
10.0000 mg | Freq: Once | RECTAL | Status: DC
  Filled 2024-06-23: qty 1

## 2024-06-23 NOTE — TOC Transition Note (Signed)
 Transition of Care [facility]) - Discharge Note   Patient Details  Name: Justin Salinas MRN: [id number] Date of Birth: 01-Jul-1940  Transition of Care [facility]) CM/SW Contact:  Almarie [name], LCSW Phone Number: 06/23/2024, 11:21 AM   Clinical Narrative:   CSW updated by RN that patient had bowel movement this morning, can discharge to SNF. CSW spoke with daughter, [name], to update, she is in agreement. Updated discharge paperwork sent to [facility], confirmed receipt and they are ready to accept patient. Transport arranged with [facility] for next available.  Nurse to call report to [phone], Room 26.     Final next level of care: Skilled Nursing Facility Barriers to Discharge: Barriers Resolved   Patient Goals and CMS Choice Patient states their goals for this hospitalization and ongoing recovery are:: patient unable to participate in goal setting, not fully oriented CMS Medicare.gov Compare Post Acute Care list provided to:: Patient Represenative (must comment) Choice offered to / list presented to : Adult Children [place] ownership interest in [facility].provided to:: Adult Children    Discharge Placement              Patient chooses bed at: [facility] Patient to be transferred to facility by: [facility] Name of family member notified: Daughter Patient and family notified of of transfer: 06/22/24  Discharge Plan and Services Additional resources added to the After Visit Summary for       Post Acute Care Choice: Home Health                               Social Drivers of Health (SDOH) Interventions SDOH Screenings   Food Insecurity: Patient Unable To Answer (06/18/2024)  Housing: Unknown (06/18/2024)  Transportation Needs: Patient Unable To Answer (06/18/2024)  Utilities: Patient Unable To Answer (06/18/2024)  Depression (PHQ2-9): Low Risk (12/11/2022)  Financial Resource Strain: Low Risk (12/11/2022)  Physical Activity: Insufficiently Active  (12/11/2022)  Social Connections: Patient Unable To Answer (06/18/2024)  Stress: No Stress Concern Present (12/11/2022)  Tobacco Use: High Risk (06/17/2024)     Readmission Risk Interventions     No data to display

## 2024-06-23 NOTE — Progress Notes (Signed)
 Physical Therapy Treatment Patient Details Name: Justin Salinas MRN: [id number] DOB: 29-Jan-1941 Today's Date: 06/23/2024   History of Present Illness 84 y.o. male presents to [facility] 06/17/24 from ALF with acute agitation, AMS, and reported seizure-like activity. Two prior falls at facility. CT head showed acute 4mm R parafalcine SDH w/o midline shift.  Prior admit 1/10 with acute encephalopathy. PMH: CAD s/p CABG x4, DMII, HTN, BPH, lumbar radiculopathy, chronic diastolic heart failure, dementia.    PT Comments  Pt tolerated treatment well today. Pt received in stedy with RN transferring to [facility]. Assisted nursing staff with pericare and transfer from [facility] to bed. Pt demonstrated significant improvement from last session only requiring Min A for mobility. No change in DC/DME recs at this time. Pt anticipates DC to SNF today. PT will continue to follow.     If plan is discharge home, recommend the following: A lot of help with walking and/or transfers;A lot of help with bathing/dressing/bathroom;Assistance with cooking/housework;Direct supervision/assist for medications management;Direct supervision/assist for financial management;Assist for transportation;Help with stairs or ramp for entrance   Can travel by private vehicle     No  Equipment Recommendations  None recommended by PT    Recommendations for Other Services       Precautions / Restrictions Precautions Precautions: Fall Recall of Precautions/Restrictions: Impaired Precaution/Restrictions Comments: SBP <160 Restrictions Weight Bearing Restrictions Per Provider Order: No     Mobility  Bed Mobility Overal bed mobility: Needs Assistance Bed Mobility: Sit to Supine       Sit to supine: Min assist   General bed mobility comments: Min A to assist BLE back to bed.    Transfers Overall transfer level: Needs assistance Equipment used: Rolling walker (2 wheels) Transfers: Sit to/from Stand, Bed to chair/wheelchair/BSC Sit to  Stand: Min assist, +2 safety/equipment   Step pivot transfers: Min assist       General transfer comment: Able to stand x3 for pericare at [facility]. Min A to step pivot from [facility] to bed.    Ambulation/Gait               General Gait Details: Deferred due to RN reporting that [facility] was on the way to take pt to SNF.   Stairs             Wheelchair Mobility     Tilt Bed    Modified Rankin (Stroke Patients Only)       Balance Overall balance assessment: Needs assistance, History of Falls Sitting-balance support: No upper extremity supported, Feet supported Sitting balance-Leahy Scale: Fair Sitting balance - Comments: CGA   Standing balance support: Single extremity supported, During functional activity, Bilateral upper extremity supported Standing balance-Leahy Scale: Poor Standing balance comment: Reliant on external support.                            Communication Communication Communication: Impaired Factors Affecting Communication: Hearing impaired  Cognition Arousal: Alert Behavior During Therapy: WFL for tasks assessed/performed, Flat affect   PT - Cognitive impairments: History of cognitive impairments, Orientation, Awareness, Memory, Attention, Sequencing, Problem solving, Safety/Judgement                       PT - Cognition Comments: Pt very HOH and responded yeah to most questions. Followed commands much more appropriately today. Following commands: Impaired Following commands impaired: Follows one step commands with increased time    Cueing Cueing Techniques: Verbal cues, Gestural  cues, Tactile cues  Exercises      General Comments General comments (skin integrity, edema, etc.): VSS      Pertinent Vitals/Pain Pain Assessment Pain Assessment: No/denies pain    Home Living                          Prior Function            PT Goals (current goals can now be found in the care plan section) Progress towards  PT goals: Progressing toward goals    Frequency    Min 2X/week      PT Plan      Co-evaluation              AM-PAC PT 6 Clicks Mobility   Outcome Measure  Help needed turning from your back to your side while in a flat bed without using bedrails?: A Little Help needed moving from lying on your back to sitting on the side of a flat bed without using bedrails?: A Little Help needed moving to and from a bed to a chair (including a wheelchair)?: A Little Help needed standing up from a chair using your arms (e.g., wheelchair or bedside chair)?: A Little Help needed to walk in hospital room?: A Lot Help needed climbing 3-5 steps with a railing? : Total 6 Click Score: 15    End of Session Equipment Utilized During Treatment: Gait belt Activity Tolerance: Patient tolerated treatment well Patient left: in bed;with call bell/phone within reach;with bed alarm set;with nursing/sitter in room Nurse Communication: Mobility status PT Visit Diagnosis: Unsteadiness on feet (R26.81);Other abnormalities of gait and mobility (R26.89);Muscle weakness (generalized) (M62.81);History of falling (Z91.81)     Time: 9058-9045 PT Time Calculation (min) (ACUTE ONLY): 13 min  Charges:    $Therapeutic Activity: 8-22 mins PT General Charges $$ ACUTE PT VISIT: 1 Visit                     Priscilla Kirstein B, PT, DPT Acute Rehab Services [phone]    Justin Salinas 06/23/2024, 12:12 PM

## 2024-06-30 ENCOUNTER — Other Ambulatory Visit: Payer: Self-pay

## 2024-06-30 ENCOUNTER — Emergency Department (HOSPITAL_COMMUNITY)

## 2024-06-30 ENCOUNTER — Inpatient Hospital Stay (HOSPITAL_COMMUNITY)
Admission: EM | Admit: 2024-06-30 | Discharge: 2024-07-03 | DRG: 522 | Disposition: A | Discharge status: Skilled Nursing Facility | Source: Skilled Nursing Facility | Attending: Infectious Diseases | Admitting: Infectious Diseases

## 2024-06-30 ENCOUNTER — Encounter (HOSPITAL_COMMUNITY): Payer: Self-pay | Admitting: Infectious Diseases

## 2024-06-30 DIAGNOSIS — R1310 Dysphagia, unspecified: Secondary | ICD-10-CM | POA: Diagnosis present

## 2024-06-30 DIAGNOSIS — K5900 Constipation, unspecified: Secondary | ICD-10-CM | POA: Diagnosis present

## 2024-06-30 DIAGNOSIS — S72041A Displaced fracture of base of neck of right femur, initial encounter for closed fracture: Secondary | ICD-10-CM | POA: Diagnosis not present

## 2024-06-30 DIAGNOSIS — S72012A Unspecified intracapsular fracture of left femur, initial encounter for closed fracture: Secondary | ICD-10-CM | POA: Diagnosis present

## 2024-06-30 DIAGNOSIS — K219 Gastro-esophageal reflux disease without esophagitis: Secondary | ICD-10-CM | POA: Diagnosis present

## 2024-06-30 DIAGNOSIS — G9340 Encephalopathy, unspecified: Secondary | ICD-10-CM | POA: Diagnosis present

## 2024-06-30 DIAGNOSIS — I2510 Atherosclerotic heart disease of native coronary artery without angina pectoris: Secondary | ICD-10-CM | POA: Diagnosis present

## 2024-06-30 DIAGNOSIS — Z87891 Personal history of nicotine dependence: Secondary | ICD-10-CM | POA: Diagnosis not present

## 2024-06-30 DIAGNOSIS — Z8049 Family history of malignant neoplasm of other genital organs: Secondary | ICD-10-CM | POA: Diagnosis not present

## 2024-06-30 DIAGNOSIS — W1830XA Fall on same level, unspecified, initial encounter: Secondary | ICD-10-CM | POA: Diagnosis present

## 2024-06-30 DIAGNOSIS — F03918 Unspecified dementia, unspecified severity, with other behavioral disturbance: Secondary | ICD-10-CM | POA: Diagnosis present

## 2024-06-30 DIAGNOSIS — N179 Acute kidney failure, unspecified: Secondary | ICD-10-CM | POA: Diagnosis present

## 2024-06-30 DIAGNOSIS — Z794 Long term (current) use of insulin: Secondary | ICD-10-CM | POA: Diagnosis not present

## 2024-06-30 DIAGNOSIS — S7290XA Unspecified fracture of unspecified femur, initial encounter for closed fracture: Secondary | ICD-10-CM | POA: Diagnosis present

## 2024-06-30 DIAGNOSIS — N183 Chronic kidney disease, stage 3 unspecified: Secondary | ICD-10-CM | POA: Diagnosis present

## 2024-06-30 DIAGNOSIS — Y92129 Unspecified place in nursing home as the place of occurrence of the external cause: Secondary | ICD-10-CM

## 2024-06-30 DIAGNOSIS — Z823 Family history of stroke: Secondary | ICD-10-CM

## 2024-06-30 DIAGNOSIS — Z981 Arthrodesis status: Secondary | ICD-10-CM | POA: Diagnosis not present

## 2024-06-30 DIAGNOSIS — E1122 Type 2 diabetes mellitus with diabetic chronic kidney disease: Secondary | ICD-10-CM | POA: Diagnosis present

## 2024-06-30 DIAGNOSIS — I10 Essential (primary) hypertension: Secondary | ICD-10-CM | POA: Diagnosis not present

## 2024-06-30 DIAGNOSIS — E860 Dehydration: Secondary | ICD-10-CM | POA: Diagnosis present

## 2024-06-30 DIAGNOSIS — I5032 Chronic diastolic (congestive) heart failure: Secondary | ICD-10-CM | POA: Diagnosis present

## 2024-06-30 DIAGNOSIS — N1832 Chronic kidney disease, stage 3b: Secondary | ICD-10-CM | POA: Diagnosis not present

## 2024-06-30 DIAGNOSIS — S72042A Displaced fracture of base of neck of left femur, initial encounter for closed fracture: Secondary | ICD-10-CM | POA: Diagnosis not present

## 2024-06-30 DIAGNOSIS — N1831 Chronic kidney disease, stage 3a: Secondary | ICD-10-CM | POA: Diagnosis present

## 2024-06-30 DIAGNOSIS — Z7982 Long term (current) use of aspirin: Secondary | ICD-10-CM

## 2024-06-30 DIAGNOSIS — Z8249 Family history of ischemic heart disease and other diseases of the circulatory system: Secondary | ICD-10-CM

## 2024-06-30 DIAGNOSIS — I130 Hypertensive heart and chronic kidney disease with heart failure and stage 1 through stage 4 chronic kidney disease, or unspecified chronic kidney disease: Secondary | ICD-10-CM | POA: Diagnosis present

## 2024-06-30 DIAGNOSIS — Z833 Family history of diabetes mellitus: Secondary | ICD-10-CM | POA: Diagnosis not present

## 2024-06-30 DIAGNOSIS — E785 Hyperlipidemia, unspecified: Secondary | ICD-10-CM | POA: Diagnosis present

## 2024-06-30 DIAGNOSIS — W19XXXA Unspecified fall, initial encounter: Secondary | ICD-10-CM | POA: Diagnosis not present

## 2024-06-30 DIAGNOSIS — Z79899 Other long term (current) drug therapy: Secondary | ICD-10-CM

## 2024-06-30 DIAGNOSIS — E119 Type 2 diabetes mellitus without complications: Secondary | ICD-10-CM

## 2024-06-30 DIAGNOSIS — Z66 Do not resuscitate: Secondary | ICD-10-CM | POA: Diagnosis present

## 2024-06-30 DIAGNOSIS — I129 Hypertensive chronic kidney disease with stage 1 through stage 4 chronic kidney disease, or unspecified chronic kidney disease: Secondary | ICD-10-CM | POA: Diagnosis not present

## 2024-06-30 DIAGNOSIS — S065XAA Traumatic subdural hemorrhage with loss of consciousness status unknown, initial encounter: Secondary | ICD-10-CM | POA: Diagnosis present

## 2024-06-30 DIAGNOSIS — J45909 Unspecified asthma, uncomplicated: Secondary | ICD-10-CM | POA: Diagnosis present

## 2024-06-30 DIAGNOSIS — Z9181 History of falling: Secondary | ICD-10-CM

## 2024-06-30 DIAGNOSIS — I5030 Unspecified diastolic (congestive) heart failure: Secondary | ICD-10-CM | POA: Diagnosis not present

## 2024-06-30 DIAGNOSIS — D62 Acute posthemorrhagic anemia: Secondary | ICD-10-CM | POA: Diagnosis not present

## 2024-06-30 DIAGNOSIS — N401 Enlarged prostate with lower urinary tract symptoms: Secondary | ICD-10-CM | POA: Diagnosis present

## 2024-06-30 DIAGNOSIS — Z951 Presence of aortocoronary bypass graft: Secondary | ICD-10-CM

## 2024-06-30 DIAGNOSIS — R296 Repeated falls: Secondary | ICD-10-CM | POA: Diagnosis present

## 2024-06-30 DIAGNOSIS — S72002A Fracture of unspecified part of neck of left femur, initial encounter for closed fracture: Secondary | ICD-10-CM | POA: Diagnosis not present

## 2024-06-30 DIAGNOSIS — F02818 Dementia in other diseases classified elsewhere, unspecified severity, with other behavioral disturbance: Secondary | ICD-10-CM | POA: Diagnosis not present

## 2024-06-30 DIAGNOSIS — I6200 Nontraumatic subdural hemorrhage, unspecified: Secondary | ICD-10-CM | POA: Diagnosis not present

## 2024-06-30 DIAGNOSIS — F0390 Unspecified dementia without behavioral disturbance: Secondary | ICD-10-CM | POA: Diagnosis not present

## 2024-06-30 DIAGNOSIS — I252 Old myocardial infarction: Secondary | ICD-10-CM

## 2024-06-30 DIAGNOSIS — R3911 Hesitancy of micturition: Secondary | ICD-10-CM | POA: Diagnosis not present

## 2024-06-30 LAB — CBC WITH DIFFERENTIAL/PLATELET
Abs Immature Granulocytes: 0.11 10*3/uL — ABNORMAL HIGH (ref 0.00–0.07)
Basophils Absolute: 0 10*3/uL (ref 0.0–0.1)
Basophils Relative: 0 %
Eosinophils Absolute: 0.2 10*3/uL (ref 0.0–0.5)
Eosinophils Relative: 2 %
HCT: 37 % — ABNORMAL LOW (ref 39.0–52.0)
Hemoglobin: 12.4 g/dL — ABNORMAL LOW (ref 13.0–17.0)
Immature Granulocytes: 1 %
Lymphocytes Relative: 9 %
Lymphs Abs: 0.9 10*3/uL (ref 0.7–4.0)
MCH: 32.3 pg (ref 26.0–34.0)
MCHC: 33.5 g/dL (ref 30.0–36.0)
MCV: 96.4 fL (ref 80.0–100.0)
Monocytes Absolute: 0.9 10*3/uL (ref 0.1–1.0)
Monocytes Relative: 8 %
Neutro Abs: 8.8 10*3/uL — ABNORMAL HIGH (ref 1.7–7.7)
Neutrophils Relative %: 80 %
Platelets: 209 10*3/uL (ref 150–400)
RBC: 3.84 MIL/uL — ABNORMAL LOW (ref 4.22–5.81)
RDW: 12.7 % (ref 11.5–15.5)
WBC: 11 10*3/uL — ABNORMAL HIGH (ref 4.0–10.5)
nRBC: 0 % (ref 0.0–0.2)

## 2024-06-30 LAB — COMPREHENSIVE METABOLIC PANEL WITH GFR
ALT: 26 U/L (ref 0–44)
AST: 23 U/L (ref 15–41)
Albumin: 3.5 g/dL (ref 3.5–5.0)
Alkaline Phosphatase: 80 U/L (ref 38–126)
Anion gap: 10 (ref 5–15)
BUN: 46 mg/dL — ABNORMAL HIGH (ref 8–23)
CO2: 23 mmol/L (ref 22–32)
Calcium: 9.2 mg/dL (ref 8.9–10.3)
Chloride: 105 mmol/L (ref 98–111)
Creatinine, Ser: 1.67 mg/dL — ABNORMAL HIGH (ref 0.61–1.24)
GFR, Estimated: 40 mL/min — ABNORMAL LOW
Glucose, Bld: 323 mg/dL — ABNORMAL HIGH (ref 70–99)
Potassium: 4.3 mmol/L (ref 3.5–5.1)
Sodium: 138 mmol/L (ref 135–145)
Total Bilirubin: 0.4 mg/dL (ref 0.0–1.2)
Total Protein: 7.1 g/dL (ref 6.5–8.1)

## 2024-06-30 LAB — URINALYSIS, W/ REFLEX TO CULTURE (INFECTION SUSPECTED)
Bilirubin Urine: NEGATIVE
Glucose, UA: >=500 mg/dL — AB
Hgb urine dipstick: NEGATIVE
Ketones, ur: NEGATIVE mg/dL
Leukocytes,Ua: NEGATIVE
Nitrite: NEGATIVE
Protein, ur: 30 mg/dL — AB
Specific Gravity, Urine: 1.016 (ref 1.005–1.030)
pH: 5 (ref 5.0–8.0)

## 2024-06-30 LAB — AMMONIA: Ammonia: 15 umol/L (ref 9–35)

## 2024-06-30 LAB — GLUCOSE, CAPILLARY: Glucose-Capillary: 230 mg/dL — ABNORMAL HIGH (ref 70–99)

## 2024-06-30 LAB — URINE DRUG SCREEN
Amphetamines: NEGATIVE
Barbiturates: NEGATIVE
Benzodiazepines: NEGATIVE
Cocaine: NEGATIVE
Fentanyl: NEGATIVE
Methadone Scn, Ur: NEGATIVE
Opiates: NEGATIVE
Tetrahydrocannabinol: NEGATIVE

## 2024-06-30 LAB — TROPONIN T, HIGH SENSITIVITY
Troponin T High Sensitivity: 56 ng/L — ABNORMAL HIGH (ref 0–19)
Troponin T High Sensitivity: 55 ng/L — ABNORMAL HIGH (ref 0–19)

## 2024-06-30 LAB — BASIC METABOLIC PANEL WITH GFR
Anion gap: 12 (ref 5–15)
BUN: 43 mg/dL — ABNORMAL HIGH (ref 8–23)
CO2: 22 mmol/L (ref 22–32)
Calcium: 9.4 mg/dL (ref 8.9–10.3)
Chloride: 106 mmol/L (ref 98–111)
Creatinine, Ser: 1.59 mg/dL — ABNORMAL HIGH (ref 0.61–1.24)
GFR, Estimated: 42 mL/min — ABNORMAL LOW
Glucose, Bld: 387 mg/dL — ABNORMAL HIGH (ref 70–99)
Potassium: 5.2 mmol/L — ABNORMAL HIGH (ref 3.5–5.1)
Sodium: 140 mmol/L (ref 135–145)

## 2024-06-30 LAB — I-STAT CHEM 8, ED
BUN: 45 mg/dL — ABNORMAL HIGH (ref 8–23)
Calcium, Ion: 1.28 mmol/L (ref 1.15–1.40)
Chloride: 107 mmol/L (ref 98–111)
Creatinine, Ser: 1.7 mg/dL — ABNORMAL HIGH (ref 0.61–1.24)
Glucose, Bld: 309 mg/dL — ABNORMAL HIGH (ref 70–99)
HCT: 37 % — ABNORMAL LOW (ref 39.0–52.0)
Hemoglobin: 12.6 g/dL — ABNORMAL LOW (ref 13.0–17.0)
Potassium: 4.3 mmol/L (ref 3.5–5.1)
Sodium: 142 mmol/L (ref 135–145)
TCO2: 21 mmol/L — ABNORMAL LOW (ref 22–32)

## 2024-06-30 LAB — I-STAT CG4 LACTIC ACID, ED
Lactic Acid, Venous: 1.9 mmol/L (ref 0.5–1.9)
Lactic Acid, Venous: 3.2 mmol/L (ref 0.5–1.9)

## 2024-06-30 LAB — ETHANOL: Alcohol, Ethyl (B): <15 mg/dL

## 2024-06-30 LAB — TYPE AND SCREEN
ABO/RH(D): O POS
Antibody Screen: NEGATIVE

## 2024-06-30 LAB — CBG MONITORING, ED: Glucose-Capillary: 346 mg/dL — ABNORMAL HIGH (ref 70–99)

## 2024-06-30 LAB — PROTIME-INR
INR: 1.3 — ABNORMAL HIGH (ref 0.8–1.2)
Prothrombin Time: 16.6 s — ABNORMAL HIGH (ref 11.4–15.2)

## 2024-06-30 MED ORDER — INSULIN GLARGINE-YFGN 100 UNIT/ML ~~LOC~~ SOLN
8.0000 [IU] | Freq: Every day | SUBCUTANEOUS | Status: DC
  Administered 2024-06-30: 8 [IU] via SUBCUTANEOUS
  Filled 2024-06-30 (×2): qty 0.08

## 2024-06-30 MED ORDER — ACETAMINOPHEN 500 MG PO TABS
1000.0000 mg | ORAL_TABLET | Freq: Three times a day (TID) | ORAL | Status: DC
  Administered 2024-06-30 – 2024-07-03 (×5): 1000 mg via ORAL
  Filled 2024-06-30 (×5): qty 2

## 2024-06-30 MED ORDER — ROSUVASTATIN CALCIUM 20 MG PO TABS
20.0000 mg | ORAL_TABLET | Freq: Every day | ORAL | Status: DC
  Administered 2024-07-02 – 2024-07-03 (×2): 20 mg via ORAL
  Filled 2024-06-30 (×2): qty 1

## 2024-06-30 MED ORDER — FLUTICASONE PROPIONATE 50 MCG/ACT NA SUSP
1.0000 | Freq: Every day | NASAL | Status: DC
  Administered 2024-07-02 – 2024-07-03 (×2): 1 via NASAL
  Filled 2024-06-30: qty 16

## 2024-06-30 MED ORDER — SPIRONOLACTONE 25 MG PO TABS
25.0000 mg | ORAL_TABLET | Freq: Every morning | ORAL | Status: DC
  Administered 2024-07-02 – 2024-07-03 (×2): 25 mg via ORAL
  Filled 2024-06-30 (×2): qty 1

## 2024-06-30 MED ORDER — AMLODIPINE BESYLATE 10 MG PO TABS
10.0000 mg | ORAL_TABLET | Freq: Every day | ORAL | Status: DC
  Administered 2024-07-02 – 2024-07-03 (×2): 10 mg via ORAL
  Filled 2024-06-30 (×2): qty 1

## 2024-06-30 MED ORDER — MUPIROCIN 2 % EX OINT
1.0000 | TOPICAL_OINTMENT | Freq: Two times a day (BID) | CUTANEOUS | Status: DC
  Administered 2024-07-01 – 2024-07-03 (×6): 1 via NASAL
  Filled 2024-06-30 (×4): qty 22

## 2024-06-30 MED ORDER — FINASTERIDE 5 MG PO TABS
5.0000 mg | ORAL_TABLET | Freq: Every day | ORAL | Status: DC
  Administered 2024-07-02 – 2024-07-03 (×2): 5 mg via ORAL
  Filled 2024-06-30 (×3): qty 1

## 2024-06-30 MED ORDER — MELATONIN 3 MG PO TABS
3.0000 mg | ORAL_TABLET | Freq: Every day | ORAL | Status: DC
  Administered 2024-06-30 – 2024-07-02 (×2): 3 mg via ORAL
  Filled 2024-06-30 (×2): qty 1

## 2024-06-30 MED ORDER — POLYETHYLENE GLYCOL 3350 17 G PO PACK
17.0000 g | PACK | Freq: Every day | ORAL | Status: DC
  Administered 2024-06-30 – 2024-07-03 (×3): 17 g via ORAL
  Filled 2024-06-30 (×3): qty 1

## 2024-06-30 MED ORDER — BIOTENE DRY MOUTH MT LIQD: 15.0000 mL | Freq: Four times a day (QID) | OROMUCOSAL | Status: DC | PRN

## 2024-06-30 MED ORDER — HEPARIN SODIUM (PORCINE) 5000 UNIT/ML IJ SOLN
5000.0000 [IU] | Freq: Three times a day (TID) | INTRAMUSCULAR | Status: DC
  Administered 2024-06-30 (×2): 5000 [IU] via SUBCUTANEOUS
  Filled 2024-06-30 (×2): qty 1

## 2024-06-30 MED ORDER — LIDOCAINE 5 % EX PTCH
1.0000 | MEDICATED_PATCH | CUTANEOUS | Status: DC
  Administered 2024-06-30 – 2024-07-03 (×3): 1 via TRANSDERMAL
  Filled 2024-06-30 (×3): qty 1

## 2024-06-30 MED ORDER — SENNA 8.6 MG PO TABS
1.0000 | ORAL_TABLET | Freq: Every evening | ORAL | Status: DC
  Administered 2024-06-30: 8.6 mg via ORAL
  Filled 2024-06-30: qty 1

## 2024-06-30 MED ORDER — ASPIRIN 81 MG PO CHEW: 81.0000 mg | CHEWABLE_TABLET | Freq: Every day | ORAL | Status: DC

## 2024-06-30 MED ORDER — SPIRONOLACTONE 25 MG PO TABS: 25.0000 mg | ORAL_TABLET | Freq: Every morning | ORAL | Status: DC

## 2024-06-30 MED ORDER — TAMSULOSIN HCL 0.4 MG PO CAPS
0.4000 mg | ORAL_CAPSULE | Freq: Every day | ORAL | Status: DC
  Administered 2024-06-30 – 2024-07-02 (×2): 0.4 mg via ORAL
  Filled 2024-06-30 (×2): qty 1

## 2024-06-30 MED ORDER — CARVEDILOL 12.5 MG PO TABS
12.5000 mg | ORAL_TABLET | Freq: Two times a day (BID) | ORAL | Status: DC
  Administered 2024-06-30 – 2024-07-03 (×4): 12.5 mg via ORAL
  Filled 2024-06-30 (×5): qty 1

## 2024-06-30 MED ORDER — VITAMIN D 25 MCG (1000 UNIT) PO TABS
2000.0000 [IU] | ORAL_TABLET | Freq: Every day | ORAL | Status: DC
  Administered 2024-07-02 – 2024-07-03 (×2): 2000 [IU] via ORAL
  Filled 2024-06-30 (×2): qty 2

## 2024-06-30 MED ORDER — CHOLECALCIFEROL 50 MCG (2000 UT) PO CAPS: 2000.0000 [IU] | ORAL_CAPSULE | Freq: Every day | ORAL | Status: DC

## 2024-06-30 MED ORDER — VITAMIN C 500 MG PO TABS
1000.0000 mg | ORAL_TABLET | Freq: Every morning | ORAL | Status: DC
  Administered 2024-07-03: 1000 mg via ORAL
  Filled 2024-06-30: qty 2

## 2024-06-30 MED ORDER — INSULIN ASPART 100 UNIT/ML IJ SOLN
0.0000 [IU] | Freq: Three times a day (TID) | INTRAMUSCULAR | Status: DC
  Administered 2024-06-30: 7 [IU] via SUBCUTANEOUS
  Administered 2024-07-01: 3 [IU] via SUBCUTANEOUS
  Administered 2024-07-01: 5 [IU] via SUBCUTANEOUS
  Administered 2024-07-02: 3 [IU] via SUBCUTANEOUS
  Administered 2024-07-02: 2 [IU] via SUBCUTANEOUS
  Administered 2024-07-02 – 2024-07-03 (×2): 5 [IU] via SUBCUTANEOUS
  Administered 2024-07-03: 2 [IU] via SUBCUTANEOUS
  Administered 2024-07-03: 5 [IU] via SUBCUTANEOUS
  Filled 2024-06-30: qty 2
  Filled 2024-06-30: qty 5
  Filled 2024-06-30: qty 7
  Filled 2024-06-30 (×2): qty 3
  Filled 2024-06-30: qty 2
  Filled 2024-06-30 (×2): qty 5
  Filled 2024-06-30: qty 2
  Filled 2024-06-30: qty 5

## 2024-06-30 MED ORDER — VITAMIN D 25 MCG (1000 UNIT) PO TABS: 1000.0000 [IU] | ORAL_TABLET | Freq: Every day | ORAL | Status: DC

## 2024-06-30 MED ORDER — LORATADINE 10 MG PO TABS
10.0000 mg | ORAL_TABLET | Freq: Every morning | ORAL | Status: DC
  Administered 2024-07-03: 10 mg via ORAL
  Filled 2024-06-30: qty 1

## 2024-06-30 MED ORDER — QUETIAPINE FUMARATE 25 MG PO TABS
25.0000 mg | ORAL_TABLET | Freq: Every day | ORAL | Status: DC
  Administered 2024-06-30 – 2024-07-02 (×2): 25 mg via ORAL
  Filled 2024-06-30 (×2): qty 1

## 2024-06-30 MED ORDER — ASPIRIN 81 MG PO TABS: 81.0000 mg | ORAL_TABLET | Freq: Every morning | ORAL | Status: DC

## 2024-06-30 MED ORDER — CHOLECALCIFEROL 50 MCG (2000 UT) PO TABS: 2000.0000 [IU] | ORAL_TABLET | Freq: Every morning | ORAL | Status: DC

## 2024-06-30 MED ORDER — SODIUM CHLORIDE 0.9 % IV SOLN: INTRAVENOUS | Status: AC

## 2024-06-30 MED ORDER — SERTRALINE HCL 100 MG PO TABS
100.0000 mg | ORAL_TABLET | Freq: Every morning | ORAL | Status: DC
  Administered 2024-07-03: 100 mg via ORAL
  Filled 2024-06-30: qty 1

## 2024-06-30 MED ORDER — REPAGLINIDE 1 MG PO TABS
0.5000 mg | ORAL_TABLET | Freq: Three times a day (TID) | ORAL | Status: DC
  Administered 2024-06-30 – 2024-07-03 (×6): 0.5 mg via ORAL
  Filled 2024-06-30 (×11): qty 0.5

## 2024-06-30 MED ORDER — SODIUM CHLORIDE 0.9 % IV SOLN: Freq: Once | INTRAVENOUS | Status: DC

## 2024-06-30 NOTE — Consult Note (Signed)
 Reason for Consult:Left hip fx Referring Physician: Maude Galloway Time called: 1231 Time at bedside: 1304   Justin Salinas is an 84 y.o. male.  HPI: Justin Salinas was brought to the ED from the SNF where he resides after multiple falls and favoring of the left hip. Workup showed a left hip fx and orthopedic surgery was consulted. He is demented and cannot contribute to history.  Past Medical History:  Diagnosis Date   Allergy    Anemia    Asthma    Atherosclerosis of abdominal aorta 02/26/2020   Seen on x-ray L-spine September 2021   BPH (benign prostatic hyperplasia)    CAD (coronary artery disease)    a. s/p MI 09/1993;  b. known CTO of RCA;  c. 06/2014 Cath: LM short/mod dzs, LAD 95ost, LCX nl, RI nl, RCA 100, EF 55%;  c. 06/2014 s/p CABG x 4: LIMA->LAD, VG->D1, VG->OM1, VG->Acute Marginal.   Carotid stenosis    a. Carotid US  1/17:  RICA 40-59%; LICA 60-79% // Carotid US  1/22: R 1-39; L 40-59; L VA aberrant flow; bilat subclavian stenosis // Carotid US  1/23: R 1-39, L 40-59; R subclavian stenosis   CKD (chronic kidney disease), stage III (HCC)    Constipation    in the past 2-3 weeks   Diabetes (HCC)    AODM   Diastolic dysfunction    a. 05/2015 Echo: EF 60-65%, LVH, Gr 1 DD, mild MR, mod dil RA/LA, mod TR, PASP .   Essential hypertension    GERD (gastroesophageal reflux disease)    History of kidney stones    Hyperlipidemia     Past Surgical History:  Procedure Laterality Date   ANGIOPLASTY  09/22/1993   POBA of RCA (Dr. FABIENE Pinion)   BACK SURGERY  2000   back fusion - Dr. Victory Gunnels   CARDIAC CATHETERIZATION  11/28/1998   silent occlusion of RCA w/collaterals from left coronary system, prox LAD w/40-50% eccentric narrowing, 1st diagonal with 70-80% eccentric narrowing, 85% narrowing of prox small OM1 (Dr. FABIENE Pinion)   CAROTID DOPPLER  07/2011   left subclavian (50-69%); right bulb (0-49%); RICA (normal); left mid-distal CCA (0-49%); left bulb/prox ICA (50-69%);  left vertebral with abnormal antegrade flow   CORONARY ARTERY BYPASS GRAFT N/A 05/21/2014   Procedure: CORONARY ARTERY BYPASS GRAFTING (CABG) x  four, using left internal mammary artery and right leg greater saphenous vein harvested endoscopically;  Surgeon: Elspeth JAYSON Millers, MD;  Location: [facility] OR;  Service: Open Heart Surgery;  Laterality: N/A;   INTRAOPERATIVE TRANSESOPHAGEAL ECHOCARDIOGRAM N/A 05/21/2014   Procedure: INTRAOPERATIVE TRANSESOPHAGEAL ECHOCARDIOGRAM;  Surgeon: Elspeth JAYSON Millers, MD;  Location: [facility] OR;  Service: Open Heart Surgery;  Laterality: N/A;   LEFT HEART CATHETERIZATION WITH CORONARY ANGIOGRAM N/A 05/19/2014   Procedure: LEFT HEART CATHETERIZATION WITH CORONARY ANGIOGRAM;  Surgeon: Ozell JONETTA Fell, MD;  Location: [facility] CATH LAB;  Service: Cardiovascular;  Laterality: N/A;   LUMBAR LAMINECTOMY/DECOMPRESSION MICRODISCECTOMY Left 09/23/2020   Procedure: Laminectomy and Foraminotomy - left - Lumbar three-Lumbar four;  Surgeon: Gunnels Victory, MD;  Location: [facility] OR;  Service: Neurosurgery;  Laterality: Left;   NASAL SINUS SURGERY  2010   NM MYOCAR PERF WALL MOTION  09/19/2009   bruce myoview - mild perfusion defect in basal inferior region (infarct/scar), EF 60%, low risk scan   RENAL DOPPLER  10/2011   SMA w/ 70-99% diameter reduction & high grade stenosis; R&L renals w/narrowing and increased velocities (60-99%), R kidney smaller than L   TRANSTHORACIC ECHOCARDIOGRAM  10/20/2012   EF 55-60%, moderate concentric hypertrophy, ventricular septum thickness increased, calcified MV annulus   VASECTOMY  1963    Family History  Problem Relation Age of Onset   Uterine cancer Mother    Stroke Father    Diabetes Sister    Colon polyps Brother    Heart disease Brother    Colon cancer Neg Hx    Esophageal cancer Neg Hx    Rectal cancer Neg Hx    Stomach cancer Neg Hx     Social History:  reports that he quit smoking about 52 years ago. His smoking use included cigarettes. His  smokeless tobacco use includes chew. He reports current alcohol use of about 3.0 standard drinks of alcohol per week. He reports that he does not use drugs.  Allergies: Allergies[1]  Medications: I have reviewed the patient's current medications.  Results for orders placed or performed during the hospital encounter of 06/30/24 (from the past 48 hours)  Troponin T, High Sensitivity     Status: Abnormal   Collection Time: 06/30/24 10:41 AM  Result Value Ref Range   Troponin T High Sensitivity 56 (H) 0 - 19 ng/L    Comment: (NOTE) Biotin concentrations > 1000 ng/mL falsely decrease TnT results.  Serial cardiac troponin measurements are suggested.  Refer to the Links section for chest pain algorithms and additional  guidance. Performed at [facility], [address], [place], [place] [address]   Ethanol     Status: None   Collection Time: 06/30/24 10:41 AM  Result Value Ref Range   Alcohol, Ethyl (B) <15 <15 mg/dL    Comment: (NOTE) For medical purposes only. Performed at [facility], [address], [place], [place] [address]   Comprehensive metabolic panel     Status: Abnormal   Collection Time: 06/30/24 10:41 AM  Result Value Ref Range   Sodium 138 135 - 145 mmol/L   Potassium 4.3 3.5 - 5.1 mmol/L   Chloride 105 98 - 111 mmol/L   CO2 23 22 - 32 mmol/L   Glucose, Bld 323 (H) 70 - 99 mg/dL    Comment: Glucose reference range applies only to samples taken after fasting for at least 8 hours.   BUN 46 (H) 8 - 23 mg/dL   Creatinine, Ser 8.32 (H) 0.61 - 1.24 mg/dL   Calcium  9.2 8.9 - 10.3 mg/dL   Total Protein 7.1 6.5 - 8.1 g/dL   Albumin  3.5 3.5 - 5.0 g/dL   AST 23 15 - 41 U/L   ALT 26 0 - 44 U/L   Alkaline Phosphatase 80 38 - 126 U/L   Total Bilirubin 0.4 0.0 - 1.2 mg/dL   GFR, Estimated 40 (L) >60 mL/min    Comment: (NOTE) Calculated using the CKD-EPI Creatinine Equation (2021)    Anion gap 10 5 - 15    Comment: Performed at [facility], [address], [place], [place] [address]  CBC with Differential     Status: Abnormal   Collection Time: 06/30/24 10:41 AM  Result Value Ref Range   WBC 11.0 (H) 4.0 - 10.5 K/uL   RBC 3.84 (L) 4.22 - 5.81 MIL/uL   Hemoglobin 12.4 (L) 13.0 - 17.0 g/dL   HCT 62.9 (L) 60.9 - 47.9 %   MCV 96.4 80.0 - 100.0 fL   MCH 32.3 26.0 - 34.0 pg   MCHC 33.5 30.0 - 36.0 g/dL   RDW 87.2 88.4 - 84.4 %  Platelets 209 150 - 400 K/uL   nRBC 0.0 0.0 - 0.2 %   Neutrophils Relative % 80 %   Neutro Abs 8.8 (H) 1.7 - 7.7 K/uL   Lymphocytes Relative 9 %   Lymphs Abs 0.9 0.7 - 4.0 K/uL   Monocytes Relative 8 %   Monocytes Absolute 0.9 0.1 - 1.0 K/uL   Eosinophils Relative 2 %   Eosinophils Absolute 0.2 0.0 - 0.5 K/uL   Basophils Relative 0 %   Basophils Absolute 0.0 0.0 - 0.1 K/uL   Immature Granulocytes 1 %   Abs Immature Granulocytes 0.11 (H) 0.00 - 0.07 K/uL    Comment: Performed at [facility], [address] [place], [place] [address]  Protime-INR     Status: Abnormal   Collection Time: 06/30/24 10:41 AM  Result Value Ref Range   Prothrombin Time 16.6 (H) 11.4 - 15.2 seconds   INR 1.3 (H) 0.8 - 1.2    Comment: (NOTE) INR goal varies based on device and disease states. Performed at [facility], [address] [place], [place] [address]   Type and screen [facility]     Status: None   Collection Time: 06/30/24 10:41 AM  Result Value Ref Range   ABO/RH(D) O POS    Antibody Screen NEG    Sample Expiration      07/03/2024,2359 Performed at [facility], [address], [place], [place] [address]   Ammonia     Status: None   Collection Time: 06/30/24 10:41 AM  Result Value Ref Range   Ammonia 15 9 - 35 umol/L    Comment: Performed at [facility], [address], [place], [place] [address]  I-stat chem 8, ED     Status: Abnormal   Collection Time: 06/30/24 10:54 AM  Result Value Ref Range   Sodium 142 135 - 145 mmol/L   Potassium 4.3 3.5 - 5.1 mmol/L    Chloride 107 98 - 111 mmol/L   BUN 45 (H) 8 - 23 mg/dL   Creatinine, Ser 8.29 (H) 0.61 - 1.24 mg/dL   Glucose, Bld 690 (H) 70 - 99 mg/dL    Comment: Glucose reference range applies only to samples taken after fasting for at least 8 hours.   Calcium , Ion 1.28 1.15 - 1.40 mmol/L   TCO2 21 (L) 22 - 32 mmol/L   Hemoglobin 12.6 (L) 13.0 - 17.0 g/dL   HCT 62.9 (L) 60.9 - 47.9 %  I-Stat Lactic Acid     Status: None   Collection Time: 06/30/24 10:58 AM  Result Value Ref Range   Lactic Acid, Venous 1.9 0.5 - 1.9 mmol/L  Troponin T, High Sensitivity     Status: Abnormal   Collection Time: 06/30/24 12:10 PM  Result Value Ref Range   Troponin T High Sensitivity 55 (H) 0 - 19 ng/L    Comment: (NOTE) Biotin concentrations > 1000 ng/mL falsely decrease TnT results.  Serial cardiac troponin measurements are suggested.  Refer to the Links section for chest pain algorithms and additional  guidance. Performed at [facility], [address], [place], [place] [address]   I-Stat Lactic Acid     Status: Abnormal   Collection Time: 06/30/24 12:15 PM  Result Value Ref Range   Lactic Acid, Venous 3.2 (HH) 0.5 - 1.9 mmol/L   Comment NOTIFIED PHYSICIAN   Urinalysis, w/ Reflex to Culture (Infection Suspected) -Urine, Clean Catch  Status: Abnormal   Collection Time: 06/30/24 12:36 PM  Result Value Ref Range   Specimen Source URINE, CLEAN CATCH    Color, Urine YELLOW YELLOW   APPearance CLEAR CLEAR   Specific Gravity, Urine 1.016 1.005 - 1.030   pH 5.0 5.0 - 8.0   Glucose, UA >=500 (A) NEGATIVE mg/dL   Hgb urine dipstick NEGATIVE NEGATIVE   Bilirubin Urine NEGATIVE NEGATIVE   Ketones, ur NEGATIVE NEGATIVE mg/dL   Protein, ur 30 (A) NEGATIVE mg/dL   Nitrite NEGATIVE NEGATIVE   Leukocytes,Ua NEGATIVE NEGATIVE   RBC / HPF 0-5 0 - 5 RBC/hpf   WBC, UA 0-5 0 - 5 WBC/hpf    Comment:        Reflex urine culture not performed if WBC <=10, OR if Squamous epithelial cells >5. If Squamous  epithelial cells >5 suggest recollection.    Bacteria, UA RARE (A) NONE SEEN   Squamous Epithelial / HPF 0-5 0 - 5 /HPF   Mucus PRESENT    Non Squamous Epithelial 0-5 (A) NONE SEEN    Comment: Performed at [facility], [address] [place], [place] [address]    CT Cervical Spine Wo Contrast Result Date: 06/30/2024 EXAM: CT CERVICAL SPINE WITHOUT CONTRAST 06/30/2024 11:50:00 AM TECHNIQUE: CT of the cervical spine was performed without the administration of intravenous contrast. Multiplanar reformatted images are provided for review. Automated exposure control, iterative reconstruction, and/or weight based adjustment of the mA/kV was utilized to reduce the radiation dose to as low as reasonably achievable. COMPARISON: CT cervical spine 06/17/2024. CLINICAL HISTORY: 84 year old male with altered mental status. FINDINGS: BONES AND ALIGNMENT: Redemonstrated fusion of C1 to the skull base, variant. No acute fracture or traumatic malalignment of the cervical spine. DEGENERATIVE CHANGES: There is multilevel intervertebral disc height loss. Multilevel uncovertebral spurring and facet arthropathy with associated neuroforaminal stenosis. SOFT TISSUES: No prevertebral soft tissue swelling. Bilateral carotid calcifications. IMPRESSION: 1. No acute fracture or traumatic malalignment of the cervical spine. Electronically signed by: Prentice Spade MD 06/30/2024 12:27 PM EST RP Workstation: [id number]   CT Head Wo Contrast Result Date: 06/30/2024 EXAM: CT HEAD WITHOUT CONTRAST 06/30/2024 11:50:00 AM TECHNIQUE: CT of the head was performed without the administration of intravenous contrast. Automated exposure control, iterative reconstruction, and/or weight based adjustment of the mA/kV was utilized to reduce the radiation dose to as low as reasonably achievable. COMPARISON: CT head 06/18/2024 and MRI brain 06/14/2024. CLINICAL HISTORY: Mental status change, unknown cause. FINDINGS: BRAIN AND VENTRICLES:  Overall similar volume loss and moderate white matter changes. Resolved subscentimeter right parafalcine subdural hematoma. No new or increasing intracranial hemorrhage. No evidence of acute infarct. No hydrocephalus. No extra-axial collection. No mass effect or midline shift. ORBITS: No acute abnormality. SINUSES: Chronic left maxillary sinusitis. SOFT TISSUES AND SKULL: Redemonstrated metallic right posterior calvarial implants with associated streak artifact limiting adjacent evaluation. No acute soft tissue abnormality. No skull fracture. IMPRESSION: 1. No acute or increasing intracranial hemorrhage. No mass effect. 2. Resolved subcentimeter right parafalcine subdural hematoma since 06/18/2024. Electronically signed by: Prentice Spade MD 06/30/2024 12:12 PM EST RP Workstation: [id number]   DG Chest 1 View Result Date: 06/30/2024 EXAM: 1 VIEW(S) XRAY OF THE CHEST 06/30/2024 11:27:42 AM COMPARISON: 06/17/2024 CLINICAL HISTORY: 84 year old male with multiple recent falls and weakness. FINDINGS: LUNGS AND PLEURA: No focal pulmonary opacity. No pleural effusion. No pneumothorax. HEART AND MEDIASTINUM: Post CABG changes. Aortic arch atherosclerosis. BONES AND SOFT TISSUES: Sternotomy wires noted. No acute osseous abnormality. IMPRESSION: 1. No  acute cardiopulmonary abnormality. 2. Aortic Atherosclerosis (ICD10-I70.0). Electronically signed by: Helayne Hurst MD 06/30/2024 11:49 AM EST RP Workstation: [id number]   DG Pelvis 1-2 Views Result Date: 06/30/2024 EXAM: 1 or 2 VIEW(S) XRAY OF THE PELVIS 06/30/2024 11:27:42 AM COMPARISON: Pelvis CT 09/07/2020. CLINICAL HISTORY: 84 year old male with multiple recent falls and weakness. FINDINGS: BONES AND JOINTS: L5-S1 surgical hardware noted. Impacted left femoral neck fracture. No malalignment. SOFT TISSUES: Vascular calcifications. Unremarkable. IMPRESSION: 1. Positive for Impacted left femoral neck fracture. Electronically signed by: Helayne Hurst MD 06/30/2024 11:48 AM EST  RP Workstation: [id number]   DG Femur Min 2 Views Left Result Date: 06/30/2024 CLINICAL DATA:  Fall with left hip pain. EXAM: LEFT FEMUR 2 VIEWS COMPARISON:  None Available. FINDINGS: There is evidence of an acute left subcapital hip fracture with mild displacement. There is no evidence of dislocation. No underlying bony lesions identified. Calcifications of the femoral arteries. Mild osteoarthritis of the left hip and knee joints. IMPRESSION: Acute left subcapital hip fracture with mild displacement. Electronically Signed   By: Marcey Moan M.D.   On: 06/30/2024 11:46    Review of Systems  Unable to perform ROS: Dementia   Blood pressure (!) 143/132, pulse 88, temperature 97.7 F (36.5 C), temperature source Axillary, resp. rate 16, height 5' 9 (1.753 m), weight 75.3 kg, SpO2 100%. Physical Exam Constitutional:      General: He is not in acute distress.    Appearance: He is well-developed. He is not diaphoretic.  HENT:     Head: Normocephalic and atraumatic.  Eyes:     General: No scleral icterus.       Right eye: No discharge.        Left eye: No discharge.     Conjunctiva/sclera: Conjunctivae normal.  Cardiovascular:     Rate and Rhythm: Normal rate and regular rhythm.  Pulmonary:     Effort: Pulmonary effort is normal. No respiratory distress.  Musculoskeletal:     Cervical back: Normal range of motion.     Comments: LLE No traumatic wounds, ecchymosis, or rash  Mod TTP hip  No knee or ankle effusion  Knee stable to varus/ valgus and anterior/posterior stress  Sens DPN, SPN, TN could not assess  Motor EHL, ext, flex, evers grossly intact  DP 1+, PT 1+, No significant edema  Skin:    General: Skin is warm and dry.  Neurological:     Mental Status: He is alert.  Psychiatric:        Mood and Affect: Mood normal.        Behavior: Behavior normal.     Assessment/Plan: Left hip fx -- Plan hip hemi tomorrow with Dr. Kendal. Please keep NPO after MN.    Ozell DOROTHA Ned, PA-C Orthopedic Surgery [phone] 06/30/2024, 1:06 PM     [1]  Allergies Allergen Reactions   Exenatide Other (See Comments)    (Byetta)- reaction not listed on MAR    Linagliptin  Other (See Comments)    (Tradjenta )- reaction not listed on MAR    Lasix  [Furosemide ] Other (See Comments)    Constipation

## 2024-06-30 NOTE — ED Triage Notes (Signed)
 Pt bibem EMS from [facility]. AMS x1 week. Increased falls, last fall 06/28/24. Possible hip fx. Generalized weakness. Aox2. Baseline Aox4.   BP 140/70 HR 84 O2 96% RA CBG 371

## 2024-06-30 NOTE — Hospital Course (Addendum)
 Impacted left femoral neck fracture Left subcapital hip fracture with mild displacement Fall Presented to ED with concern of fall. Report was primarily gathered from facility and daughter as the patient is altered. He was discharged from [facility] on 06/23/2024 after admission for acute encephalopathy thought to be due to progression of his dementia versus SDH vs polypharmacy. He was discharged around baseline without surgical intervention. At the facility had multiple falls with his last fall being on 1/24. Reports were that the family declined transport to the hospital due to severe winter weather. The facility continued to see worsening mentation and brought the patient to the hospital on Tuesday. During ED eval, patient was alert to self and denied pain or SOB. He was mildly hypertensive but otherwise hemodynamically stable and satting well on room air. CT of the head showed resolution of previously seen small subdural hematoma, CT of the cervical spine showed no acute abnormalities. X-rays of the femur and pelvis showed impacted left femoral neck fracture and left subcapital hip fracture with mild displacement. Orthopedic surgery was consulted and patient underwent L hip hemiarthroplasty for femoral neck fracture. He received post operative ancef  for 3 doses and placed on aspirin  for DVT prophylaxis. PT and OT mobilized during hospitalization with weight bearing as tolerated to LLE. SNF recommended. Pain control with PRN tylenol , oxycodone  5 mg, and methocarbamol .   Difficulty swallowing  Patient was noted to have food in mouth from previous day on day of surgery after being NPO. Son during post-surgery evaluation stated there was concern that patient was having more difficulty swallowing recently (taking more effort). Due to aspiration risk, SLP eval order was placed. SLP noted: xerostomia noted with small about of debris removed with swab. Prolonged oral phase with solids. Soft solid texture  resulted in more efficient oral phase. Oral residue with solid and soft solid textures minimally improved with liquid wash. He was placed on intermittent supervision with meals with mechanical soft solids with thin liquids and oral care following meals.   Acute encephalopathy with underlying dementia Recent subdural hematoma Patient on admission was oriented to person only. New baseline appears to be that patient is alert to person and then occasionally to location/situation. CT of the head showed resolution of previously seen subdural hematoma.  Current presentation of Encephalopathy likely due to combination of pain from hip fracture, progressive underlying dementia, polypharmacy, and poor oral intake recently. Throughout hospitalization, patient was placed on delirium protocol and continued on Seroquel  25 mg nightly, sertraline  100 mg daily, and melatonin 3 mg nightly. When NPO, patient received one time dose zyprexa .    AKI on CKD stage III Creatinine 1.67 here up from 1.41 during last admission.  Likely due to poor oral intake after recurrent falls and painful injury with hip fracture.  Given IV fluids in the ED.  Cr improved.    HTN CAD s/p CABG Slightly hypertensive here and we will resume his home medications. Restarted on amlodipine  10 mg daily, aspirin  81 mg daily, carvedilol  12.5 mg twice daily, rosuvastatin  20 mg daily, and spironolactone  25 mg daily. Hydralazine  was added PRN for SBP >180.    Type 2 diabetes Hemoglobin A1c 6.9 on 06/13/2024.  He continues on basal bolus insulin . Continued up to 15 units insulin  glargine nightly and sensitive sliding scale.   Constipation  Patient has history of constipation. Was on bowel regimen throughout hospitalization. Had BM on day of discharge

## 2024-06-30 NOTE — ED Notes (Signed)
 Patient transported to CT

## 2024-06-30 NOTE — H&P (View-Only) (Signed)
 Reason for Consult:Left hip fx Referring Physician: Maude Galloway Time called: 1231 Time at bedside: 1304   Justin Salinas is an 84 y.o. male.  HPI: Justin Salinas was brought to the ED from the SNF where he resides after multiple falls and favoring of the left hip. Workup showed a left hip fx and orthopedic surgery was consulted. He is demented and cannot contribute to history.  Past Medical History:  Diagnosis Date   Allergy    Anemia    Asthma    Atherosclerosis of abdominal aorta 02/26/2020   Seen on x-ray L-spine September 2021   BPH (benign prostatic hyperplasia)    CAD (coronary artery disease)    a. s/p MI 09/1993;  b. known CTO of RCA;  c. 06/2014 Cath: LM short/mod dzs, LAD 95ost, LCX nl, RI nl, RCA 100, EF 55%;  c. 06/2014 s/p CABG x 4: LIMA->LAD, VG->D1, VG->OM1, VG->Acute Marginal.   Carotid stenosis    a. Carotid US  1/17:  RICA 40-59%; LICA 60-79% // Carotid US  1/22: R 1-39; L 40-59; L VA aberrant flow; bilat subclavian stenosis // Carotid US  1/23: R 1-39, L 40-59; R subclavian stenosis   CKD (chronic kidney disease), stage III (HCC)    Constipation    in the past 2-3 weeks   Diabetes (HCC)    AODM   Diastolic dysfunction    a. 05/2015 Echo: EF 60-65%, LVH, Gr 1 DD, mild MR, mod dil RA/LA, mod TR, PASP .   Essential hypertension    GERD (gastroesophageal reflux disease)    History of kidney stones    Hyperlipidemia     Past Surgical History:  Procedure Laterality Date   ANGIOPLASTY  09/22/1993   POBA of RCA (Dr. FABIENE Pinion)   BACK SURGERY  2000   back fusion - Dr. Victory Gunnels   CARDIAC CATHETERIZATION  11/28/1998   silent occlusion of RCA w/collaterals from left coronary system, prox LAD w/40-50% eccentric narrowing, 1st diagonal with 70-80% eccentric narrowing, 85% narrowing of prox small OM1 (Dr. FABIENE Pinion)   CAROTID DOPPLER  07/2011   left subclavian (50-69%); right bulb (0-49%); RICA (normal); left mid-distal CCA (0-49%); left bulb/prox ICA (50-69%);  left vertebral with abnormal antegrade flow   CORONARY ARTERY BYPASS GRAFT N/A 05/21/2014   Procedure: CORONARY ARTERY BYPASS GRAFTING (CABG) x  four, using left internal mammary artery and right leg greater saphenous vein harvested endoscopically;  Surgeon: Elspeth JAYSON Millers, MD;  Location: [facility] OR;  Service: Open Heart Surgery;  Laterality: N/A;   INTRAOPERATIVE TRANSESOPHAGEAL ECHOCARDIOGRAM N/A 05/21/2014   Procedure: INTRAOPERATIVE TRANSESOPHAGEAL ECHOCARDIOGRAM;  Surgeon: Elspeth JAYSON Millers, MD;  Location: [facility] OR;  Service: Open Heart Surgery;  Laterality: N/A;   LEFT HEART CATHETERIZATION WITH CORONARY ANGIOGRAM N/A 05/19/2014   Procedure: LEFT HEART CATHETERIZATION WITH CORONARY ANGIOGRAM;  Surgeon: Ozell JONETTA Fell, MD;  Location: [facility] CATH LAB;  Service: Cardiovascular;  Laterality: N/A;   LUMBAR LAMINECTOMY/DECOMPRESSION MICRODISCECTOMY Left 09/23/2020   Procedure: Laminectomy and Foraminotomy - left - Lumbar three-Lumbar four;  Surgeon: Gunnels Victory, MD;  Location: [facility] OR;  Service: Neurosurgery;  Laterality: Left;   NASAL SINUS SURGERY  2010   NM MYOCAR PERF WALL MOTION  09/19/2009   bruce myoview - mild perfusion defect in basal inferior region (infarct/scar), EF 60%, low risk scan   RENAL DOPPLER  10/2011   SMA w/ 70-99% diameter reduction & high grade stenosis; R&L renals w/narrowing and increased velocities (60-99%), R kidney smaller than L   TRANSTHORACIC ECHOCARDIOGRAM  10/20/2012   EF 55-60%, moderate concentric hypertrophy, ventricular septum thickness increased, calcified MV annulus   VASECTOMY  1963    Family History  Problem Relation Age of Onset   Uterine cancer Mother    Stroke Father    Diabetes Sister    Colon polyps Brother    Heart disease Brother    Colon cancer Neg Hx    Esophageal cancer Neg Hx    Rectal cancer Neg Hx    Stomach cancer Neg Hx     Social History:  reports that he quit smoking about 52 years ago. His smoking use included cigarettes. His  smokeless tobacco use includes chew. He reports current alcohol use of about 3.0 standard drinks of alcohol per week. He reports that he does not use drugs.  Allergies: Allergies[1]  Medications: I have reviewed the patient's current medications.  Results for orders placed or performed during the hospital encounter of 06/30/24 (from the past 48 hours)  Troponin T, High Sensitivity     Status: Abnormal   Collection Time: 06/30/24 10:41 AM  Result Value Ref Range   Troponin T High Sensitivity 56 (H) 0 - 19 ng/L    Comment: (NOTE) Biotin concentrations > 1000 ng/mL falsely decrease TnT results.  Serial cardiac troponin measurements are suggested.  Refer to the Links section for chest pain algorithms and additional  guidance. Performed at [facility], [address], [place], [place] [address]   Ethanol     Status: None   Collection Time: 06/30/24 10:41 AM  Result Value Ref Range   Alcohol, Ethyl (B) <15 <15 mg/dL    Comment: (NOTE) For medical purposes only. Performed at [facility], [address], [place], [place] [address]   Comprehensive metabolic panel     Status: Abnormal   Collection Time: 06/30/24 10:41 AM  Result Value Ref Range   Sodium 138 135 - 145 mmol/L   Potassium 4.3 3.5 - 5.1 mmol/L   Chloride 105 98 - 111 mmol/L   CO2 23 22 - 32 mmol/L   Glucose, Bld 323 (H) 70 - 99 mg/dL    Comment: Glucose reference range applies only to samples taken after fasting for at least 8 hours.   BUN 46 (H) 8 - 23 mg/dL   Creatinine, Ser 8.32 (H) 0.61 - 1.24 mg/dL   Calcium  9.2 8.9 - 10.3 mg/dL   Total Protein 7.1 6.5 - 8.1 g/dL   Albumin  3.5 3.5 - 5.0 g/dL   AST 23 15 - 41 U/L   ALT 26 0 - 44 U/L   Alkaline Phosphatase 80 38 - 126 U/L   Total Bilirubin 0.4 0.0 - 1.2 mg/dL   GFR, Estimated 40 (L) >60 mL/min    Comment: (NOTE) Calculated using the CKD-EPI Creatinine Equation (2021)    Anion gap 10 5 - 15    Comment: Performed at [facility], [address], [place], [place] [address]  CBC with Differential     Status: Abnormal   Collection Time: 06/30/24 10:41 AM  Result Value Ref Range   WBC 11.0 (H) 4.0 - 10.5 K/uL   RBC 3.84 (L) 4.22 - 5.81 MIL/uL   Hemoglobin 12.4 (L) 13.0 - 17.0 g/dL   HCT 62.9 (L) 60.9 - 47.9 %   MCV 96.4 80.0 - 100.0 fL   MCH 32.3 26.0 - 34.0 pg   MCHC 33.5 30.0 - 36.0 g/dL   RDW 87.2 88.4 - 84.4 %  Platelets 209 150 - 400 K/uL   nRBC 0.0 0.0 - 0.2 %   Neutrophils Relative % 80 %   Neutro Abs 8.8 (H) 1.7 - 7.7 K/uL   Lymphocytes Relative 9 %   Lymphs Abs 0.9 0.7 - 4.0 K/uL   Monocytes Relative 8 %   Monocytes Absolute 0.9 0.1 - 1.0 K/uL   Eosinophils Relative 2 %   Eosinophils Absolute 0.2 0.0 - 0.5 K/uL   Basophils Relative 0 %   Basophils Absolute 0.0 0.0 - 0.1 K/uL   Immature Granulocytes 1 %   Abs Immature Granulocytes 0.11 (H) 0.00 - 0.07 K/uL    Comment: Performed at [facility], [address] [place], [place] [address]  Protime-INR     Status: Abnormal   Collection Time: 06/30/24 10:41 AM  Result Value Ref Range   Prothrombin Time 16.6 (H) 11.4 - 15.2 seconds   INR 1.3 (H) 0.8 - 1.2    Comment: (NOTE) INR goal varies based on device and disease states. Performed at [facility], [address] [place], [place] [address]   Type and screen [facility]     Status: None   Collection Time: 06/30/24 10:41 AM  Result Value Ref Range   ABO/RH(D) O POS    Antibody Screen NEG    Sample Expiration      07/03/2024,2359 Performed at [facility], [address], [place], [place] [address]   Ammonia     Status: None   Collection Time: 06/30/24 10:41 AM  Result Value Ref Range   Ammonia 15 9 - 35 umol/L    Comment: Performed at [facility], [address], [place], [place] [address]  I-stat chem 8, ED     Status: Abnormal   Collection Time: 06/30/24 10:54 AM  Result Value Ref Range   Sodium 142 135 - 145 mmol/L   Potassium 4.3 3.5 - 5.1 mmol/L    Chloride 107 98 - 111 mmol/L   BUN 45 (H) 8 - 23 mg/dL   Creatinine, Ser 8.29 (H) 0.61 - 1.24 mg/dL   Glucose, Bld 690 (H) 70 - 99 mg/dL    Comment: Glucose reference range applies only to samples taken after fasting for at least 8 hours.   Calcium , Ion 1.28 1.15 - 1.40 mmol/L   TCO2 21 (L) 22 - 32 mmol/L   Hemoglobin 12.6 (L) 13.0 - 17.0 g/dL   HCT 62.9 (L) 60.9 - 47.9 %  I-Stat Lactic Acid     Status: None   Collection Time: 06/30/24 10:58 AM  Result Value Ref Range   Lactic Acid, Venous 1.9 0.5 - 1.9 mmol/L  Troponin T, High Sensitivity     Status: Abnormal   Collection Time: 06/30/24 12:10 PM  Result Value Ref Range   Troponin T High Sensitivity 55 (H) 0 - 19 ng/L    Comment: (NOTE) Biotin concentrations > 1000 ng/mL falsely decrease TnT results.  Serial cardiac troponin measurements are suggested.  Refer to the Links section for chest pain algorithms and additional  guidance. Performed at [facility], [address], [place], [place] [address]   I-Stat Lactic Acid     Status: Abnormal   Collection Time: 06/30/24 12:15 PM  Result Value Ref Range   Lactic Acid, Venous 3.2 (HH) 0.5 - 1.9 mmol/L   Comment NOTIFIED PHYSICIAN   Urinalysis, w/ Reflex to Culture (Infection Suspected) -Urine, Clean Catch  Status: Abnormal   Collection Time: 06/30/24 12:36 PM  Result Value Ref Range   Specimen Source URINE, CLEAN CATCH    Color, Urine YELLOW YELLOW   APPearance CLEAR CLEAR   Specific Gravity, Urine 1.016 1.005 - 1.030   pH 5.0 5.0 - 8.0   Glucose, UA >=500 (A) NEGATIVE mg/dL   Hgb urine dipstick NEGATIVE NEGATIVE   Bilirubin Urine NEGATIVE NEGATIVE   Ketones, ur NEGATIVE NEGATIVE mg/dL   Protein, ur 30 (A) NEGATIVE mg/dL   Nitrite NEGATIVE NEGATIVE   Leukocytes,Ua NEGATIVE NEGATIVE   RBC / HPF 0-5 0 - 5 RBC/hpf   WBC, UA 0-5 0 - 5 WBC/hpf    Comment:        Reflex urine culture not performed if WBC <=10, OR if Squamous epithelial cells >5. If Squamous  epithelial cells >5 suggest recollection.    Bacteria, UA RARE (A) NONE SEEN   Squamous Epithelial / HPF 0-5 0 - 5 /HPF   Mucus PRESENT    Non Squamous Epithelial 0-5 (A) NONE SEEN    Comment: Performed at [facility], [address] [place], [place] [address]    CT Cervical Spine Wo Contrast Result Date: 06/30/2024 EXAM: CT CERVICAL SPINE WITHOUT CONTRAST 06/30/2024 11:50:00 AM TECHNIQUE: CT of the cervical spine was performed without the administration of intravenous contrast. Multiplanar reformatted images are provided for review. Automated exposure control, iterative reconstruction, and/or weight based adjustment of the mA/kV was utilized to reduce the radiation dose to as low as reasonably achievable. COMPARISON: CT cervical spine 06/17/2024. CLINICAL HISTORY: 84 year old male with altered mental status. FINDINGS: BONES AND ALIGNMENT: Redemonstrated fusion of C1 to the skull base, variant. No acute fracture or traumatic malalignment of the cervical spine. DEGENERATIVE CHANGES: There is multilevel intervertebral disc height loss. Multilevel uncovertebral spurring and facet arthropathy with associated neuroforaminal stenosis. SOFT TISSUES: No prevertebral soft tissue swelling. Bilateral carotid calcifications. IMPRESSION: 1. No acute fracture or traumatic malalignment of the cervical spine. Electronically signed by: Prentice Spade MD 06/30/2024 12:27 PM EST RP Workstation: [id number]   CT Head Wo Contrast Result Date: 06/30/2024 EXAM: CT HEAD WITHOUT CONTRAST 06/30/2024 11:50:00 AM TECHNIQUE: CT of the head was performed without the administration of intravenous contrast. Automated exposure control, iterative reconstruction, and/or weight based adjustment of the mA/kV was utilized to reduce the radiation dose to as low as reasonably achievable. COMPARISON: CT head 06/18/2024 and MRI brain 06/14/2024. CLINICAL HISTORY: Mental status change, unknown cause. FINDINGS: BRAIN AND VENTRICLES:  Overall similar volume loss and moderate white matter changes. Resolved subscentimeter right parafalcine subdural hematoma. No new or increasing intracranial hemorrhage. No evidence of acute infarct. No hydrocephalus. No extra-axial collection. No mass effect or midline shift. ORBITS: No acute abnormality. SINUSES: Chronic left maxillary sinusitis. SOFT TISSUES AND SKULL: Redemonstrated metallic right posterior calvarial implants with associated streak artifact limiting adjacent evaluation. No acute soft tissue abnormality. No skull fracture. IMPRESSION: 1. No acute or increasing intracranial hemorrhage. No mass effect. 2. Resolved subcentimeter right parafalcine subdural hematoma since 06/18/2024. Electronically signed by: Prentice Spade MD 06/30/2024 12:12 PM EST RP Workstation: [id number]   DG Chest 1 View Result Date: 06/30/2024 EXAM: 1 VIEW(S) XRAY OF THE CHEST 06/30/2024 11:27:42 AM COMPARISON: 06/17/2024 CLINICAL HISTORY: 84 year old male with multiple recent falls and weakness. FINDINGS: LUNGS AND PLEURA: No focal pulmonary opacity. No pleural effusion. No pneumothorax. HEART AND MEDIASTINUM: Post CABG changes. Aortic arch atherosclerosis. BONES AND SOFT TISSUES: Sternotomy wires noted. No acute osseous abnormality. IMPRESSION: 1. No  acute cardiopulmonary abnormality. 2. Aortic Atherosclerosis (ICD10-I70.0). Electronically signed by: Helayne Hurst MD 06/30/2024 11:49 AM EST RP Workstation: [id number]   DG Pelvis 1-2 Views Result Date: 06/30/2024 EXAM: 1 or 2 VIEW(S) XRAY OF THE PELVIS 06/30/2024 11:27:42 AM COMPARISON: Pelvis CT 09/07/2020. CLINICAL HISTORY: 84 year old male with multiple recent falls and weakness. FINDINGS: BONES AND JOINTS: L5-S1 surgical hardware noted. Impacted left femoral neck fracture. No malalignment. SOFT TISSUES: Vascular calcifications. Unremarkable. IMPRESSION: 1. Positive for Impacted left femoral neck fracture. Electronically signed by: Helayne Hurst MD 06/30/2024 11:48 AM EST  RP Workstation: [id number]   DG Femur Min 2 Views Left Result Date: 06/30/2024 CLINICAL DATA:  Fall with left hip pain. EXAM: LEFT FEMUR 2 VIEWS COMPARISON:  None Available. FINDINGS: There is evidence of an acute left subcapital hip fracture with mild displacement. There is no evidence of dislocation. No underlying bony lesions identified. Calcifications of the femoral arteries. Mild osteoarthritis of the left hip and knee joints. IMPRESSION: Acute left subcapital hip fracture with mild displacement. Electronically Signed   By: Marcey Moan M.D.   On: 06/30/2024 11:46    Review of Systems  Unable to perform ROS: Dementia   Blood pressure (!) 143/132, pulse 88, temperature 97.7 F (36.5 C), temperature source Axillary, resp. rate 16, height 5' 9 (1.753 m), weight 75.3 kg, SpO2 100%. Physical Exam Constitutional:      General: He is not in acute distress.    Appearance: He is well-developed. He is not diaphoretic.  HENT:     Head: Normocephalic and atraumatic.  Eyes:     General: No scleral icterus.       Right eye: No discharge.        Left eye: No discharge.     Conjunctiva/sclera: Conjunctivae normal.  Cardiovascular:     Rate and Rhythm: Normal rate and regular rhythm.  Pulmonary:     Effort: Pulmonary effort is normal. No respiratory distress.  Musculoskeletal:     Cervical back: Normal range of motion.     Comments: LLE No traumatic wounds, ecchymosis, or rash  Mod TTP hip  No knee or ankle effusion  Knee stable to varus/ valgus and anterior/posterior stress  Sens DPN, SPN, TN could not assess  Motor EHL, ext, flex, evers grossly intact  DP 1+, PT 1+, No significant edema  Skin:    General: Skin is warm and dry.  Neurological:     Mental Status: He is alert.  Psychiatric:        Mood and Affect: Mood normal.        Behavior: Behavior normal.     Assessment/Plan: Left hip fx -- Plan hip hemi tomorrow with Dr. Kendal. Please keep NPO after MN.    Ozell DOROTHA Ned, PA-C Orthopedic Surgery [phone] 06/30/2024, 1:06 PM     [1]  Allergies Allergen Reactions   Exenatide Other (See Comments)    (Byetta)- reaction not listed on MAR    Linagliptin  Other (See Comments)    (Tradjenta )- reaction not listed on MAR    Lasix  [Furosemide ] Other (See Comments)    Constipation

## 2024-06-30 NOTE — ED Provider Notes (Signed)
 " [place] EMERGENCY DEPARTMENT AT [facility] Provider Note   CSN: [id number] Arrival date & time: 06/30/24  1025     Patient presents with: Altered Mental Status   Justin Salinas is a 84 y.o. male.   84 year old male with medical history detailed below presents for evaluation.  Patient is a resident at [facility].  EMS transported today.  Patient's last known normal was on Thursday of last week.  Patient with several falls over the weekend.  Family apparently declined transport for evaluation over the weekend secondary to concerns over the weather.  Patient with several falls over the last several days.  He may have hit his head.  He is less responsive today than his baseline.  He appears to be favoring his left hip and reports some pain to the left hip.  Staff at his facility were concerned about possible occult left hip fracture.  Patient was recently admitted and discharged last week on January 20.  See excerpt from discharge summary below.  Disposition and follow-up:   [name] was discharged from [facility] in Good condition.  At the hospital follow up visit please address:   1.   -Dementia: curbside with neurology during hospitalization recommended continued dc of risperidone  and continuation of seroquel  25 mg nightly. Patient did well on this during hospitalization in addition to home sertraline  100 mg, melatonin nightly, and delirium precautions. Neurology also recommended possible start of cinamet. Could consider outpatient with referral / workup from neurology. Overall, appeared closer to baseline at discharge to SNF: pleasant, cooperative, alert and oriented to self, location (with prompting).  -SDH: stable on repeat imaging. Surgery not recommended. Started on pharmacologic DVT prior to dc, continue ASA 81 mg outpatient.  -Concern for seizure: patient had negative EEG. Treated with keppra . Dc on 1/21.  -HTN: consider  increase in coreg  to 25 mg BID if not well controlled outpatient.  -BPH - switched to tamsulosin  0.4 mg (from alfuzosin )     Hospital Course by problem list: Justin Salinas is a 84 y.o. Justin Salinas is a 84 y.o. male residing in assisted living facility, with past medical history of dementia, CAD s/p CABG, hypertension, type 2 diabetes, CKD stage III, chronic diastolic heart failure who presents with acute agitation, altered mental status, reported seizure-like activity and admitted for encephalopathy workup and 4mm right parafalcine SHD now being discharged on hospital day 0 with the following pertinent hospital course:   #Acute Encephalopathy  #Agitation #Acute SDH, 4 mm right parafalcine (mBIG 1) Recent discharge and treatment for UTI 1/12.  Changes to home medication regiment at last hospitalization: previously on risperidone  for 6 months but was discontinued due to concerns for NMS and cogwheel rigidity. After discharge had 2 episodes of fall, one episode on 1/13 reported from staff at assisted living facility. This was day Seroquel  was started. On 1/14 is had another fall and staff reported seizure like activities. Denied head trauma x2.    ED CT scan showed 4 mm SDH. Neurosurgery consulted and did not recommend surgery. Repeat CT: Stable small right peripheral subdermal hematoma measuring approximately 4 mm without mass effect.  Moderate generalized volume loss and mild to moderate cerebral white matter disease.  Moderate chronic appearing mucosal disease with left maxillary sinus.  UDS negative. TSH, B1: NL in prior admission. Encephalopathy differential diagnosis included: SDH vs dopaminergic rebound with cessation of risperidone  vs seizure versus progressive dementia. Curbside consult with neurology recommended continued cessation  of risperidone  given suspicion of parkinsonian features. Recommended continuing Seroquel  25 mg nightly and consider Cinamet. Avoided haloperidol /risperidone   with suspicion of parkinsonism/LBD and benzos. Per neurology, technically difficult EEG study but suggestive of generalized cerebral dysfunction. No seizures or epileptiform changes seen throughout recording. Loaded with Keppra  and continued on Keppra  500 mg BID for 7 days. Palliative care consulted for conversation with daughter and family who agreed based on decline since April 2025 pneumonia, DRN-limited with dc to SNF at ALF given clinical improvement. MOST form filled.    Delirium precautions placed during hospitalization. Benzos avoided. Melatonin given nightly. ASA and anticoagulation held initially in setting of SHD, SBP goal <160. Continued sertraline  100 mg and Seroquel  25 mg nightly and will continue on discharge. Patient was discharged having spent several days around baseline (pleasant, cooperative, alert and oriented to self, occasionally location with options) and having worked with with physical therapy.    #Syncope vs seizure vs mechanical fall Mixed story; transient unilateral limpness reported; subdural could be traumatic or incidental but treat as traumatic until proven otherwise.  EEG unremarkable for seizure.  Electrolytes WNL. Given Keppra  loading dose and then started on Keppra  500 mg BID for 7 days. No signs of seizure or fall while hospitalized. Patient worked well with physical therapy walking with assistance.    #Recent UTI (E. coli), resolved  BPH Finished course of antibiotics.  Urinalysis this admission unremarkable.  switched to tamsulosin  0.4 mg (from alfuzosin ).   #HTN / CAD / HFpEF Goal SBP <160. Restarted on amlodipine  10 mg,  coreg  12.5 mg BID (home dose 25 mg BID), spironolactone  25 mg daily, hydralazine  25 mg q 8 hours PRN, Crestor  20 mg daily.   #DM2 / RXI6a Started back on home dose insulin  prior to discharge. Glucose monitored throughout hospitalization.   # VTE prophylaxis Small SDH, Avoided chemical VTE ppx initially. Started on enoxaparin  40 mg.     The history is provided by the patient, medical records and the EMS personnel.       Prior to Admission medications  Medication Sig Start Date End Date Taking? Authorizing Provider  acetaminophen  (TYLENOL ) 325 MG tablet Take 650 mg by mouth every 6 (six) hours as needed for moderate pain (pain score 4-6). 06/15/24   [provider]  amLODipine  (NORVASC ) 10 MG tablet Take 1 tablet (10 mg total) by mouth daily. 06/23/24   Harrie Bruckner, DO  antiseptic oral rinse (BIOTENE) LIQD 15 mLs by Mouth Rinse route 4 (four) times daily as needed for dry mouth. 06/15/24   [provider]  Ascorbic Acid  (VITAMIN C ) 1000 MG tablet Take 1,000 mg by mouth daily.    [provider]  aspirin  81 MG tablet Take 81 mg by mouth every morning.    [provider]  carvedilol  (COREG ) 25 MG tablet Take 0.5 tablets (12.5 mg total) by mouth 2 (two) times daily. Hold if SBP<105 or HR <60 06/22/24   Harrie Bruckner, DO  Cholecalciferol  (VITAMIN D3 SUPER STRENGTH) 50 MCG (2000 UT) TABS Take 1 tablet by mouth daily.    [provider]  cloNIDine  (CATAPRES  - DOSED IN MG/24 HR) 0.1 mg/24hr patch Place 0.1 mg onto the skin every Friday. 06/16/24   [provider]  finasteride  (PROSCAR ) 5 MG tablet Take 5 mg by mouth daily. 09/20/23   [provider]  fluticasone  (FLONASE ) 50 MCG/ACT nasal spray Place 1 spray into both nostrils daily. 09/02/23   [provider]  Glucose-Vitamin C  (DEX4 Sal[place] PACK PO)  Take 15 g by mouth daily as needed (hypoglycemia). 06/15/24   [provider]  insulin  lispro (HUMALOG KWIKPEN) 100 UNIT/ML KwikPen Inject 0-10 Units into the skin See admin instructions. Take 3 times daily per sliding scale: If BS<70 Call MD,  IF BS is 71-150=0 units,  151-200=2 units,  201-250=4 units,  251-300=6 units,  301-350=8 units,  351-400=10 units,  BS>400 Call MD    [provider]  LANTUS  SOLOSTAR 100 UNIT/ML Solostar Pen  Inject 8 Units into the skin at bedtime. 12/10/22   [provider]  levETIRAcetam  (KEPPRA ) 500 MG tablet Take 1 tablet (500 mg total) by mouth 2 (two) times daily. Patient to stop taking on 1/21 (after having completed 7 days of Keppra ) 06/22/24   Harrie Bruckner, DO  loratadine  (CLARITIN ) 10 MG tablet Take 10 mg by mouth daily.    [provider]  melatonin 3 MG TABS tablet Take 1 tablet (3 mg total) by mouth at bedtime. 06/22/24   Harrie Bruckner, DO  polyethylene glycol (MIRALAX  / GLYCOLAX ) 17 g packet Take 17 g by mouth daily as needed for moderate constipation. 06/15/24   [provider]  QUEtiapine  (SEROQUEL ) 25 MG tablet Take 1 tablet (25 mg total) by mouth at bedtime. 06/15/24   Harrie Bruckner, DO  repaglinide  (PRANDIN ) 0.5 MG tablet Take 0.5 mg by mouth 3 (three) times daily before meals. 15-30 min. Before each meal    [provider]  rosuvastatin  (CRESTOR ) 20 MG tablet Take 1 tablet (20 mg total) by mouth daily. 06/16/24   Harrie Bruckner, DO  senna (SENOKOT) 8.6 MG TABS tablet Take 1 tablet by mouth every evening.    [provider]  sertraline  (ZOLOFT ) 100 MG tablet Take 100 mg by mouth daily. 09/21/23   [provider]  spironolactone  (ALDACTONE ) 25 MG tablet Take 25 mg by mouth daily. 06/13/24   [provider]  tamsulosin  (FLOMAX ) 0.4 MG CAPS capsule Take 1 capsule (0.4 mg total) by mouth daily after supper. 06/22/24   Harrie Bruckner, DO    Allergies: Exenatide, Linagliptin , and Lasix  [furosemide ]    Review of Systems  Unable to perform ROS: Dementia    Updated Vital Signs There were no vitals taken for this visit.  Physical Exam Vitals and nursing note reviewed.  Constitutional:      General: He is not in acute distress.    Appearance: He is well-developed.  HENT:     Head: Normocephalic and atraumatic.  Eyes:     Conjunctiva/sclera: Conjunctivae normal.  Cardiovascular:     Rate and Rhythm:  Normal rate and regular rhythm.     Heart sounds: No murmur heard. Pulmonary:     Effort: Pulmonary effort is normal. No respiratory distress.     Breath sounds: Normal breath sounds.  Abdominal:     Palpations: Abdomen is soft.     Tenderness: There is no abdominal tenderness.  Musculoskeletal:        General: Tenderness present. No swelling.     Cervical back: Neck supple.     Comments: Tender with attempted manipulation of the left hip.  Patient localizes pain to the left proximal thigh and lateral hip.  Skin:    General: Skin is warm and dry.     Capillary Refill: Capillary refill takes less than 2 seconds.  Neurological:     Mental Status: He is alert.  Psychiatric:        Mood and Affect: Mood normal.     (all  labs ordered are listed, but only abnormal results are displayed) Labs Reviewed  ETHANOL  COMPREHENSIVE METABOLIC PANEL WITH GFR  CBC WITH DIFFERENTIAL/PLATELET  URINALYSIS, W/ REFLEX TO CULTURE (INFECTION SUSPECTED)  URINE DRUG SCREEN  PROTIME-INR  AMMONIA  I-STAT CHEM 8, ED  I-STAT CG4 LACTIC ACID, ED  TYPE AND SCREEN  TROPONIN T, HIGH SENSITIVITY    EKG: None  Radiology: No results found.   Procedures   Medications Ordered in the ED - No data to display                                  Medical Decision Making Patient with fall reportedly on Sunday.  Presents today with increasing agitation and left hip pain.  Labs suggest mild dehydration.  Imaging shows a left hip fracture.  Orthopedics is aware of case and will consult.  Patient will require admission.  Internal medicine service made aware of case and will evaluate for admission.  Daughter of patient Justin Salinas) made aware of need to admit via phone conversation.  Amount and/or Complexity of Data Reviewed Labs: ordered. Radiology: ordered.  Risk Prescription drug management. Decision regarding hospitalization.        Final diagnoses:  Fall, initial encounter  Closed  fracture of left hip, initial encounter [facility])  Dehydration    ED Discharge Orders     None          Laurice Maude BROCKS, MD 06/30/24 1253  "

## 2024-06-30 NOTE — H&P (Signed)
 " Date: 06/30/2024               Patient Name:  Justin Salinas MRN: [id number]  DOB: Jan 23, 1941 Age / Sex: 84 y.o., male   PCP: Kennyth Worth HERO, MD         Medical Service: Internal Medicine Teaching Service         Attending Physician: Dr. Eben Reyes BROCKS, MD      First Contact: Dr. Sallyanne Primas, DO    Second Contact: Dr. Lonni Africa, DO         After Hours (After 5p/  First Contact Pager: [phone]  weekends / holidays): Second Contact Pager: [phone]   SUBJECTIVE   Chief Complaint: Fall  History of Present Illness:  Justin Salinas is an 84 year old male with a past medical history of dementia, CAD s/p CABG, hypertension, type 2 diabetes, CKD stage III, HFpEF, and recurrent falls who presents with altered mental status and recent falls from [facility].  Report primarily gathered from facility and daughter as the patient is altered.  He was discharged from [facility] on 06/23/2024 after admission for acute encephalopathy thought to be due to progression of his dementia versus polypharmacy.  At the facility had multiple falls with his last fall being on Saturday.  Reports were that the family declined transport to the hospital due to severe winter weather.  The facility continued to see worsening mentation and brought the patient to the hospital on Tuesday.  He is currently alert to person but not place or time and is denying any acute concerns including pain or trouble breathing.  He is following commands appropriately and does not appear to be in any acute distress.  ED Course: Presented as above mildly hypertensive but otherwise hemodynamically stable and satting well on room air.  Metabolic panel showed slight increase in his creatinine to 1.67, BUN of 46, and glucose of 323.  CBC shows mild leukocytosis at 11 and mild normocytic anemia at 12.4.  Troponins flat at 56-55.  Lactic acid initially 1.9 then up to 3.2.  Urinalysis with large amount of glucose,  moderate amount of protein, no pyuria and rare bacteria.  Ethanol and UDS are negative.  CT of the head showed resolution of previously seen small subdural hematoma, CT of the cervical spine showed no acute abnormalities.  X-rays of the femur and pelvis showed impacted left femoral neck fracture and left subcapital hip fracture with mild displacement.  IMTS and orthopedics were paged for admission.  Past Medical History Dementia CAD s/p CABG Hypertension Type 2 diabetes CKD stage III HFpEF Recurrent falls Iron deficiency anemia BPH  Meds:  Reviewed discharge medication list and MAR from facility Medications below based on [facility] Amlodipine  10 mg daily Aspirin  81 mg daily Biotene dry mouth oral rinse 4 times daily as needed Carvedilol  12.5 mg twice daily Clonidine  0.1 mg transdermal patch once weekly on Fridays Finasteride  5 mg daily Flonase  daily Sliding scale insulin  3 times daily with meals Lantus  8 units nightly Loratadine  10 mg daily Melatonin 3 mg nightly MiraLAX  17 g daily as needed Quetiapine  25 mg nightly Repaglinide  3.5 mg 3 times daily before meals Rosuvastatin  20 mg daily Senna 8.6 mg nightly Spironolactone  25 mg daily Tamsulosin  0.4 mg nightly Tylenol  650 mg every 6 hours as needed Vitamin C  1000 mg daily Vitamin D3 50 mcg daily Sertraline  100 mg daily  Keppra  5 mg twice daily-last given 06/24/2024 per [facility]  Past Surgical History Past Surgical History:  Procedure Laterality Date   ANGIOPLASTY  09/22/1993   POBA of RCA (Dr. FABIENE Pinion)   BACK SURGERY  2000   back fusion - Dr. Victory Gunnels   CARDIAC CATHETERIZATION  11/28/1998   silent occlusion of RCA w/collaterals from left coronary system, prox LAD w/40-50% eccentric narrowing, 1st diagonal with 70-80% eccentric narrowing, 85% narrowing of prox small OM1 (Dr. FABIENE Pinion)   CAROTID DOPPLER  07/2011   left subclavian (50-69%); right bulb (0-49%); RICA (normal); left mid-distal CCA (0-49%); left bulb/prox ICA  (50-69%); left vertebral with abnormal antegrade flow   CORONARY ARTERY BYPASS GRAFT N/A 05/21/2014   Procedure: CORONARY ARTERY BYPASS GRAFTING (CABG) x  four, using left internal mammary artery and right leg greater saphenous vein harvested endoscopically;  Surgeon: Elspeth JAYSON Millers, MD;  Location: [facility] OR;  Service: Open Heart Surgery;  Laterality: N/A;   INTRAOPERATIVE TRANSESOPHAGEAL ECHOCARDIOGRAM N/A 05/21/2014   Procedure: INTRAOPERATIVE TRANSESOPHAGEAL ECHOCARDIOGRAM;  Surgeon: Elspeth JAYSON Millers, MD;  Location: [facility] OR;  Service: Open Heart Surgery;  Laterality: N/A;   LEFT HEART CATHETERIZATION WITH CORONARY ANGIOGRAM N/A 05/19/2014   Procedure: LEFT HEART CATHETERIZATION WITH CORONARY ANGIOGRAM;  Surgeon: Ozell JONETTA Fell, MD;  Location: [facility] CATH LAB;  Service: Cardiovascular;  Laterality: N/A;   LUMBAR LAMINECTOMY/DECOMPRESSION MICRODISCECTOMY Left 09/23/2020   Procedure: Laminectomy and Foraminotomy - left - Lumbar three-Lumbar four;  Surgeon: Gunnels Victory, MD;  Location: [facility] OR;  Service: Neurosurgery;  Laterality: Left;   NASAL SINUS SURGERY  2010   NM MYOCAR PERF WALL MOTION  09/19/2009   bruce myoview - mild perfusion defect in basal inferior region (infarct/scar), EF 60%, low risk scan   RENAL DOPPLER  10/2011   SMA w/ 70-99% diameter reduction & high grade stenosis; R&L renals w/narrowing and increased velocities (60-99%), R kidney smaller than L   TRANSTHORACIC ECHOCARDIOGRAM  10/20/2012   EF 55-60%, moderate concentric hypertrophy, ventricular septum thickness increased, calcified MV annulus   VASECTOMY  1963    Social:  Lives at [facility]  Is dependent in ADLs and IDLs. PCP: Kennyth Worth HERO, MD  Family History:  Family History  Problem Relation Age of Onset   Uterine cancer Mother    Stroke Father    Diabetes Sister    Colon polyps Brother    Heart disease Brother    Colon cancer Neg Hx    Esophageal cancer Neg Hx    Rectal cancer Neg Hx    Stomach cancer  Neg Hx     Allergies: Allergies as of 06/30/2024 - Review Complete 06/30/2024  Allergen Reaction Noted   Exenatide Other (See Comments) 09/05/2019   Linagliptin  Other (See Comments) 09/05/2019   Lasix  [furosemide ] Other (See Comments) 10/04/2014    Review of Systems: A complete ROS was negative except as per HPI.   OBJECTIVE:   Physical Exam: Blood pressure (!) 143/132, pulse 88, temperature 97.7 F (36.5 C), temperature source Axillary, resp. rate 16, height 5' 9 (1.753 m), weight 75.3 kg, SpO2 100%.  Constitutional: Comfortable appearing elderly male laying in bed. In no acute distress. HENT: Normocephalic, atraumatic,  Eyes: Sclera non-icteric, PERRL, EOM intact Cardio:Regular rate and rhythm. 2+ bilateral radial and dorsalis pedis  pulses. Pulm:Clear to auscultation bilaterally in the anterior fields. Normal work of breathing on room air. Abdomen: Soft, non-tender, non-distended, positive bowel sounds. FDX:Wzhjupcz for extremity edema. Skin:Warm and dry. Neuro:Alert and oriented to person only. No focal deficit noted.  Follows commands and moves all 4 extremities, decreased motion with left  lower extremity likely due to pain.  Bilateral lower extremities neurovascularly intact.  Labs: CBC    Component Value Date/Time   WBC 11.0 (H) 06/30/2024 1041   RBC 3.84 (L) 06/30/2024 1041   HGB 12.6 (L) 06/30/2024 1054   HCT 37.0 (L) 06/30/2024 1054   PLT 209 06/30/2024 1041   MCV 96.4 06/30/2024 1041   MCH 32.3 06/30/2024 1041   MCHC 33.5 06/30/2024 1041   RDW 12.7 06/30/2024 1041   LYMPHSABS 0.9 06/30/2024 1041   MONOABS 0.9 06/30/2024 1041   EOSABS 0.2 06/30/2024 1041   BASOSABS 0.0 06/30/2024 1041     CMP     Component Value Date/Time   NA 142 06/30/2024 1054   NA 139 03/12/2023 0000   K 4.3 06/30/2024 1054   CL 107 06/30/2024 1054   CO2 23 06/30/2024 1041   GLUCOSE 309 (H) 06/30/2024 1054   BUN 45 (H) 06/30/2024 1054   BUN 49 (A) 03/12/2023 0000   CREATININE  1.70 (H) 06/30/2024 1054   CALCIUM  9.2 06/30/2024 1041   PROT 7.1 06/30/2024 1041   ALBUMIN  3.5 06/30/2024 1041   AST 23 06/30/2024 1041   ALT 26 06/30/2024 1041   ALKPHOS 80 06/30/2024 1041   BILITOT 0.4 06/30/2024 1041   GFRNONAA 40 (L) 06/30/2024 1041   GFRAA 46 (L) 06/19/2018 0922    Imaging: CT Cervical Spine Wo Contrast Result Date: 06/30/2024 EXAM: CT CERVICAL SPINE WITHOUT CONTRAST 06/30/2024 11:50:00 AM TECHNIQUE: CT of the cervical spine was performed without the administration of intravenous contrast. Multiplanar reformatted images are provided for review. Automated exposure control, iterative reconstruction, and/or weight based adjustment of the mA/kV was utilized to reduce the radiation dose to as low as reasonably achievable. COMPARISON: CT cervical spine 06/17/2024. CLINICAL HISTORY: 84 year old male with altered mental status. FINDINGS: BONES AND ALIGNMENT: Redemonstrated fusion of C1 to the skull base, variant. No acute fracture or traumatic malalignment of the cervical spine. DEGENERATIVE CHANGES: There is multilevel intervertebral disc height loss. Multilevel uncovertebral spurring and facet arthropathy with associated neuroforaminal stenosis. SOFT TISSUES: No prevertebral soft tissue swelling. Bilateral carotid calcifications. IMPRESSION: 1. No acute fracture or traumatic malalignment of the cervical spine. Electronically signed by: Prentice Spade MD 06/30/2024 12:27 PM EST RP Workstation: [id number]   CT Head Wo Contrast Result Date: 06/30/2024 EXAM: CT HEAD WITHOUT CONTRAST 06/30/2024 11:50:00 AM TECHNIQUE: CT of the head was performed without the administration of intravenous contrast. Automated exposure control, iterative reconstruction, and/or weight based adjustment of the mA/kV was utilized to reduce the radiation dose to as low as reasonably achievable. COMPARISON: CT head 06/18/2024 and MRI brain 06/14/2024. CLINICAL HISTORY: Mental status change, unknown cause.  FINDINGS: BRAIN AND VENTRICLES: Overall similar volume loss and moderate white matter changes. Resolved subscentimeter right parafalcine subdural hematoma. No new or increasing intracranial hemorrhage. No evidence of acute infarct. No hydrocephalus. No extra-axial collection. No mass effect or midline shift. ORBITS: No acute abnormality. SINUSES: Chronic left maxillary sinusitis. SOFT TISSUES AND SKULL: Redemonstrated metallic right posterior calvarial implants with associated streak artifact limiting adjacent evaluation. No acute soft tissue abnormality. No skull fracture. IMPRESSION: 1. No acute or increasing intracranial hemorrhage. No mass effect. 2. Resolved subcentimeter right parafalcine subdural hematoma since 06/18/2024. Electronically signed by: Prentice Spade MD 06/30/2024 12:12 PM EST RP Workstation: [id number]   DG Chest 1 View Result Date: 06/30/2024 EXAM: 1 VIEW(S) XRAY OF THE CHEST 06/30/2024 11:27:42 AM COMPARISON: 06/17/2024 CLINICAL HISTORY: 84 year old male with multiple recent falls and weakness. FINDINGS: LUNGS AND  PLEURA: No focal pulmonary opacity. No pleural effusion. No pneumothorax. HEART AND MEDIASTINUM: Post CABG changes. Aortic arch atherosclerosis. BONES AND SOFT TISSUES: Sternotomy wires noted. No acute osseous abnormality. IMPRESSION: 1. No acute cardiopulmonary abnormality. 2. Aortic Atherosclerosis (ICD10-I70.0). Electronically signed by: Helayne Hurst MD 06/30/2024 11:49 AM EST RP Workstation: [id number]   DG Pelvis 1-2 Views Result Date: 06/30/2024 EXAM: 1 or 2 VIEW(S) XRAY OF THE PELVIS 06/30/2024 11:27:42 AM COMPARISON: Pelvis CT 09/07/2020. CLINICAL HISTORY: 84 year old male with multiple recent falls and weakness. FINDINGS: BONES AND JOINTS: L5-S1 surgical hardware noted. Impacted left femoral neck fracture. No malalignment. SOFT TISSUES: Vascular calcifications. Unremarkable. IMPRESSION: 1. Positive for Impacted left femoral neck fracture. Electronically signed by: Helayne Hurst MD 06/30/2024 11:48 AM EST RP Workstation: [id number]   DG Femur Min 2 Views Left Result Date: 06/30/2024 CLINICAL DATA:  Fall with left hip pain. EXAM: LEFT FEMUR 2 VIEWS COMPARISON:  None Available. FINDINGS: There is evidence of an acute left subcapital hip fracture with mild displacement. There is no evidence of dislocation. No underlying bony lesions identified. Calcifications of the femoral arteries. Mild osteoarthritis of the left hip and knee joints. IMPRESSION: Acute left subcapital hip fracture with mild displacement. Electronically Signed   By: Marcey Moan M.D.   On: 06/30/2024 11:46     EKG: personally reviewed my interpretation is sinus rhythm with significant artifact. Consistent with prior EKG.  ASSESSMENT & PLAN:   Assessment & Plan by Problem: Principal Problem:   Femur fracture (HCC) Active Problems:   Controlled type 2 diabetes mellitus, with long-term current use of insulin  (HCC)   Essential hypertension   S/P CABG x 4   CKD stage 3 due to type 2 diabetes mellitus (HCC)   AKI (acute kidney injury)   Dementia with behavioral disturbance (HCC)   Acute encephalopathy   SDH (subdural hematoma) (HCC)   Justin Salinas is a 84 y.o. male with pertinent PMH of dementia, CAD s/p CABG, hypertension, type 2 diabetes, CKD stage III, HFpEF, and recurrent falls who presented with fall and decreased left leg movement and is admitted for left femur fracture.  Impacted left femoral neck fracture Left subcapital hip fracture with mild displacement Fall Patient is comfortable currently and left lower extremity remains neurovascularly intact.  Orthopedics to evaluate.  We will treat with nonopiate pain medications and watch for new pain or agitation. - Tylenol  1000 mg every 8 hours - Lidocaine  patch - Orthopedic consult - Nonweightbearing for now  Acute encephalopathy with underlying dementia Recent subdural hematoma Patient is currently oriented to person only which  does not appear very far off his baseline from previous admission where he was usually alert to person and then occasionally to location.  CT of the head here shows resolution of previously seen subdural hematoma.  Encephalopathy likely due to combination of pain from hip fracture, progressive underlying dementia, polypharmacy, and poor oral intake recently.  Overall appears comfortable, low suspicion for hypoactive delirium as he is quite responsive.  He was evaluated for seizures during his last admission and was started on Keppra  however it does not appear that this medication was continued.  No definitive seizures or seizure focus found on spot EEG at last admission.  He does have leukocytosis but no fever and no other signs of systemic infection, UA without signs of infection. - Delirium precautions - Continue quetiapine  25 mg nightly, sertraline  100 mg daily, and melatonin 3 mg nightly  AKI on CKD stage III Creatinine 1.67 here up  from 1.41 during last admission.  Likely due to poor oral intake after recurrent falls and painful injury with hip fracture.  Given IV fluids in the ED.  We will discontinue continuous fluids to decrease the risk of delirium. - Repeat BMP tomorrow  HTN CAD s/p CABG Slightly hypertensive here and we will resume his home medications. - Amlodipine  10 mg daily, aspirin  81 mg daily, carvedilol  12.5 mg twice daily, rosuvastatin  20 mg daily, and spironolactone  25 mg daily  Type 2 diabetes Hemoglobin A1c 6.9 on 06/13/2024.  He continues on basal bolus insulin . - Continue 8 units insulin  glargine nightly and sensitive sliding scale  Diet: Carb-Modified VTE: Heparin  Code: DNR/DNI  Dispo: Admit patient to Inpatient with expected length of stay greater than 2 midnights.  Signed: Fairy Pool, DO Internal Medicine Resident, PGY-3 Please contact the on call pager at [phone] for any urgent or emergent needs. 2:18 PM 06/30/2024 "

## 2024-07-01 ENCOUNTER — Inpatient Hospital Stay (HOSPITAL_COMMUNITY): Admitting: Registered Nurse

## 2024-07-01 ENCOUNTER — Inpatient Hospital Stay (HOSPITAL_COMMUNITY)

## 2024-07-01 ENCOUNTER — Other Ambulatory Visit: Payer: Self-pay

## 2024-07-01 ENCOUNTER — Encounter (HOSPITAL_COMMUNITY): Payer: Self-pay | Admitting: Infectious Diseases

## 2024-07-01 ENCOUNTER — Encounter (HOSPITAL_COMMUNITY)
Admission: EM | Disposition: A | Discharge status: Skilled Nursing Facility | Payer: Self-pay | Source: Skilled Nursing Facility | Attending: Infectious Diseases

## 2024-07-01 DIAGNOSIS — R3911 Hesitancy of micturition: Secondary | ICD-10-CM | POA: Diagnosis not present

## 2024-07-01 DIAGNOSIS — I2510 Atherosclerotic heart disease of native coronary artery without angina pectoris: Secondary | ICD-10-CM | POA: Diagnosis not present

## 2024-07-01 DIAGNOSIS — K5900 Constipation, unspecified: Secondary | ICD-10-CM

## 2024-07-01 DIAGNOSIS — S72041A Displaced fracture of base of neck of right femur, initial encounter for closed fracture: Secondary | ICD-10-CM

## 2024-07-01 DIAGNOSIS — F0390 Unspecified dementia without behavioral disturbance: Secondary | ICD-10-CM

## 2024-07-01 DIAGNOSIS — S72002A Fracture of unspecified part of neck of left femur, initial encounter for closed fracture: Secondary | ICD-10-CM | POA: Diagnosis not present

## 2024-07-01 DIAGNOSIS — I10 Essential (primary) hypertension: Secondary | ICD-10-CM | POA: Diagnosis not present

## 2024-07-01 DIAGNOSIS — R1310 Dysphagia, unspecified: Secondary | ICD-10-CM | POA: Diagnosis not present

## 2024-07-01 DIAGNOSIS — I129 Hypertensive chronic kidney disease with stage 1 through stage 4 chronic kidney disease, or unspecified chronic kidney disease: Secondary | ICD-10-CM

## 2024-07-01 DIAGNOSIS — N1832 Chronic kidney disease, stage 3b: Secondary | ICD-10-CM | POA: Diagnosis not present

## 2024-07-01 DIAGNOSIS — G9340 Encephalopathy, unspecified: Secondary | ICD-10-CM | POA: Diagnosis not present

## 2024-07-01 DIAGNOSIS — Z87891 Personal history of nicotine dependence: Secondary | ICD-10-CM | POA: Diagnosis not present

## 2024-07-01 DIAGNOSIS — E1122 Type 2 diabetes mellitus with diabetic chronic kidney disease: Secondary | ICD-10-CM | POA: Diagnosis not present

## 2024-07-01 DIAGNOSIS — N179 Acute kidney failure, unspecified: Secondary | ICD-10-CM | POA: Diagnosis not present

## 2024-07-01 DIAGNOSIS — W19XXXA Unspecified fall, initial encounter: Secondary | ICD-10-CM | POA: Diagnosis not present

## 2024-07-01 LAB — CBC
HCT: 36.5 % — ABNORMAL LOW (ref 39.0–52.0)
Hemoglobin: 12.1 g/dL — ABNORMAL LOW (ref 13.0–17.0)
MCH: 32 pg (ref 26.0–34.0)
MCHC: 33.2 g/dL (ref 30.0–36.0)
MCV: 96.6 fL (ref 80.0–100.0)
Platelets: 199 10*3/uL (ref 150–400)
RBC: 3.78 MIL/uL — ABNORMAL LOW (ref 4.22–5.81)
RDW: 12.7 % (ref 11.5–15.5)
WBC: 11.6 10*3/uL — ABNORMAL HIGH (ref 4.0–10.5)
nRBC: 0 % (ref 0.0–0.2)

## 2024-07-01 LAB — BASIC METABOLIC PANEL WITH GFR
Anion gap: 11 (ref 5–15)
BUN: 40 mg/dL — ABNORMAL HIGH (ref 8–23)
CO2: 22 mmol/L (ref 22–32)
Calcium: 9.4 mg/dL (ref 8.9–10.3)
Chloride: 109 mmol/L (ref 98–111)
Creatinine, Ser: 1.47 mg/dL — ABNORMAL HIGH (ref 0.61–1.24)
GFR, Estimated: 46 mL/min — ABNORMAL LOW
Glucose, Bld: 270 mg/dL — ABNORMAL HIGH (ref 70–99)
Potassium: 4.6 mmol/L (ref 3.5–5.1)
Sodium: 141 mmol/L (ref 135–145)

## 2024-07-01 LAB — GLUCOSE, CAPILLARY
Glucose-Capillary: 271 mg/dL — ABNORMAL HIGH (ref 70–99)
Glucose-Capillary: 249 mg/dL — ABNORMAL HIGH (ref 70–99)
Glucose-Capillary: 240 mg/dL — ABNORMAL HIGH (ref 70–99)
Glucose-Capillary: 224 mg/dL — ABNORMAL HIGH (ref 70–99)
Glucose-Capillary: 219 mg/dL — ABNORMAL HIGH (ref 70–99)
Glucose-Capillary: 258 mg/dL — ABNORMAL HIGH (ref 70–99)
Glucose-Capillary: 244 mg/dL — ABNORMAL HIGH (ref 70–99)

## 2024-07-01 LAB — SURGICAL PCR SCREEN
MRSA, PCR: NEGATIVE
Staphylococcus aureus: NEGATIVE

## 2024-07-01 LAB — LACTIC ACID, PLASMA: Lactic Acid, Venous: 1.1 mmol/L (ref 0.5–1.9)

## 2024-07-01 LAB — VITAMIN D 25 HYDROXY (VIT D DEFICIENCY, FRACTURES): Vit D, 25-Hydroxy: 27.1 ng/mL — ABNORMAL LOW (ref 30–100)

## 2024-07-01 MED ORDER — SENNA 8.6 MG PO TABS
1.0000 | ORAL_TABLET | Freq: Two times a day (BID) | ORAL | Status: DC
  Administered 2024-07-02 – 2024-07-03 (×3): 8.6 mg via ORAL
  Filled 2024-07-01 (×4): qty 1

## 2024-07-01 MED ORDER — ORAL CARE MOUTH RINSE: 15.0000 mL | Freq: Once | OROMUCOSAL | Status: AC

## 2024-07-01 MED ORDER — CEFAZOLIN SODIUM-DEXTROSE 2-4 GM/100ML-% IV SOLN
2.0000 g | INTRAVENOUS | Status: AC
  Administered 2024-07-01: 2 g via INTRAVENOUS

## 2024-07-01 MED ORDER — POVIDONE-IODINE 10 % EX SWAB
2.0000 | Freq: Once | CUTANEOUS | Status: AC
  Administered 2024-07-01: 2 via TOPICAL

## 2024-07-01 MED ORDER — INSULIN GLARGINE-YFGN 100 UNIT/ML ~~LOC~~ SOLN
10.0000 [IU] | Freq: Every day | SUBCUTANEOUS | Status: DC
  Administered 2024-07-01 – 2024-07-02 (×2): 10 [IU] via SUBCUTANEOUS
  Filled 2024-07-01 (×4): qty 0.1

## 2024-07-01 MED ORDER — ONDANSETRON HCL 4 MG/2ML IJ SOLN
INTRAMUSCULAR | Status: DC | PRN
  Administered 2024-07-01: 4 mg via INTRAVENOUS

## 2024-07-01 MED ORDER — ROCURONIUM BROMIDE 10 MG/ML (PF) SYRINGE
PREFILLED_SYRINGE | INTRAVENOUS | Status: DC | PRN
  Administered 2024-07-01: 50 mg via INTRAVENOUS
  Administered 2024-07-01 (×2): 10 mg via INTRAVENOUS

## 2024-07-01 MED ORDER — METHOCARBAMOL 1000 MG/10ML IJ SOLN: 500.0000 mg | Freq: Three times a day (TID) | INTRAMUSCULAR | Status: DC | PRN

## 2024-07-01 MED ORDER — ONDANSETRON HCL 4 MG/2ML IJ SOLN
INTRAMUSCULAR | Status: AC
  Filled 2024-07-01: qty 2

## 2024-07-01 MED ORDER — LIDOCAINE 2% (20 MG/ML) 5 ML SYRINGE
INTRAMUSCULAR | Status: DC | PRN
  Administered 2024-07-01: 80 mg via INTRAVENOUS

## 2024-07-01 MED ORDER — METOCLOPRAMIDE HCL 5 MG PO TABS: 5.0000 mg | ORAL_TABLET | Freq: Three times a day (TID) | ORAL | Status: DC | PRN

## 2024-07-01 MED ORDER — CHLORHEXIDINE GLUCONATE 0.12 % MT SOLN: 15.0000 mL | Freq: Once | OROMUCOSAL | Status: AC

## 2024-07-01 MED ORDER — TRANEXAMIC ACID-NACL 1000-0.7 MG/100ML-% IV SOLN
1000.0000 mg | Freq: Once | INTRAVENOUS | Status: AC
  Administered 2024-07-01: 1000 mg via INTRAVENOUS
  Filled 2024-07-01: qty 100

## 2024-07-01 MED ORDER — DIPHENHYDRAMINE HCL 12.5 MG/5ML PO ELIX: 12.5000 mg | ORAL_SOLUTION | ORAL | Status: DC | PRN

## 2024-07-01 MED ORDER — DOCUSATE SODIUM 100 MG PO CAPS
100.0000 mg | ORAL_CAPSULE | Freq: Two times a day (BID) | ORAL | Status: DC
  Administered 2024-07-02 – 2024-07-03 (×3): 100 mg via ORAL
  Filled 2024-07-01 (×3): qty 1

## 2024-07-01 MED ORDER — PHENYLEPHRINE 80 MCG/ML (10ML) SYRINGE FOR IV PUSH (FOR BLOOD PRESSURE SUPPORT)
PREFILLED_SYRINGE | INTRAVENOUS | Status: AC
  Filled 2024-07-01: qty 10

## 2024-07-01 MED ORDER — PHENYLEPHRINE HCL-NACL 20-0.9 MG/250ML-% IV SOLN
INTRAVENOUS | Status: DC | PRN
  Administered 2024-07-01: 25 ug/min via INTRAVENOUS

## 2024-07-01 MED ORDER — VANCOMYCIN HCL 1000 MG IV SOLR
INTRAVENOUS | Status: DC | PRN
  Administered 2024-07-01: 1000 mg via TOPICAL

## 2024-07-01 MED ORDER — ONDANSETRON HCL 4 MG/2ML IJ SOLN: 4.0000 mg | Freq: Four times a day (QID) | INTRAMUSCULAR | Status: DC | PRN

## 2024-07-01 MED ORDER — CHLORHEXIDINE GLUCONATE 0.12 % MT SOLN
OROMUCOSAL | Status: AC
  Administered 2024-07-01: 15 mL via OROMUCOSAL
  Filled 2024-07-01: qty 15

## 2024-07-01 MED ORDER — ACETAMINOPHEN 325 MG PO TABS: 325.0000 mg | ORAL_TABLET | Freq: Four times a day (QID) | ORAL | Status: DC | PRN

## 2024-07-01 MED ORDER — LACTATED RINGERS IV SOLN: INTRAVENOUS | Status: DC

## 2024-07-01 MED ORDER — SUGAMMADEX SODIUM 200 MG/2ML IV SOLN
INTRAVENOUS | Status: DC | PRN
  Administered 2024-07-01: 200 mg via INTRAVENOUS

## 2024-07-01 MED ORDER — FENTANYL CITRATE (PF) 100 MCG/2ML IJ SOLN: 25.0000 ug | INTRAMUSCULAR | Status: DC | PRN

## 2024-07-01 MED ORDER — PHENYLEPHRINE 80 MCG/ML (10ML) SYRINGE FOR IV PUSH (FOR BLOOD PRESSURE SUPPORT)
PREFILLED_SYRINGE | INTRAVENOUS | Status: DC | PRN
  Administered 2024-07-01: 160 ug via INTRAVENOUS

## 2024-07-01 MED ORDER — OXYCODONE HCL 5 MG/5ML PO SOLN: 5.0000 mg | Freq: Once | ORAL | Status: DC | PRN

## 2024-07-01 MED ORDER — MENTHOL 3 MG MT LOZG: 1.0000 | LOZENGE | OROMUCOSAL | Status: DC | PRN

## 2024-07-01 MED ORDER — EPHEDRINE 5 MG/ML INJ
INTRAVENOUS | Status: AC
  Filled 2024-07-01: qty 5

## 2024-07-01 MED ORDER — PHENOL 1.4 % MT LIQD: 1.0000 | OROMUCOSAL | Status: DC | PRN

## 2024-07-01 MED ORDER — HYDRALAZINE HCL 10 MG PO TABS: 10.0000 mg | ORAL_TABLET | Freq: Three times a day (TID) | ORAL | Status: DC | PRN

## 2024-07-01 MED ORDER — TRANEXAMIC ACID-NACL 1000-0.7 MG/100ML-% IV SOLN
INTRAVENOUS | Status: AC
  Filled 2024-07-01: qty 100

## 2024-07-01 MED ORDER — LIDOCAINE 2% (20 MG/ML) 5 ML SYRINGE
INTRAMUSCULAR | Status: AC
  Filled 2024-07-01: qty 5

## 2024-07-01 MED ORDER — SODIUM CHLORIDE 0.9 % IV SOLN: INTRAVENOUS | Status: AC

## 2024-07-01 MED ORDER — CEFAZOLIN SODIUM-DEXTROSE 2-4 GM/100ML-% IV SOLN
2.0000 g | Freq: Three times a day (TID) | INTRAVENOUS | Status: AC
  Administered 2024-07-01 – 2024-07-02 (×3): 2 g via INTRAVENOUS
  Filled 2024-07-01 (×3): qty 100

## 2024-07-01 MED ORDER — ROCURONIUM BROMIDE 10 MG/ML (PF) SYRINGE
PREFILLED_SYRINGE | INTRAVENOUS | Status: AC
  Filled 2024-07-01: qty 10

## 2024-07-01 MED ORDER — PROPOFOL 10 MG/ML IV BOLUS
INTRAVENOUS | Status: AC
  Filled 2024-07-01: qty 20

## 2024-07-01 MED ORDER — OXYCODONE HCL 5 MG PO TABS: 5.0000 mg | ORAL_TABLET | Freq: Once | ORAL | Status: DC | PRN

## 2024-07-01 MED ORDER — METHOCARBAMOL 500 MG PO TABS: 500.0000 mg | ORAL_TABLET | Freq: Three times a day (TID) | ORAL | Status: DC | PRN

## 2024-07-01 MED ORDER — DEXAMETHASONE SOD PHOSPHATE PF 10 MG/ML IJ SOLN
INTRAMUSCULAR | Status: DC | PRN
  Administered 2024-07-01: 5 mg via INTRAVENOUS

## 2024-07-01 MED ORDER — FENTANYL CITRATE (PF) 100 MCG/2ML IJ SOLN
INTRAMUSCULAR | Status: AC
  Filled 2024-07-01: qty 2

## 2024-07-01 MED ORDER — OXYCODONE HCL 5 MG PO TABS
2.5000 mg | ORAL_TABLET | ORAL | Status: DC | PRN
  Administered 2024-07-02: 5 mg via ORAL
  Filled 2024-07-01: qty 1

## 2024-07-01 MED ORDER — STERILE WATER FOR IRRIGATION IR SOLN
Status: DC | PRN
  Administered 2024-07-01: 1000 mL

## 2024-07-01 MED ORDER — PROPOFOL 10 MG/ML IV BOLUS
INTRAVENOUS | Status: DC | PRN
  Administered 2024-07-01: 100 mg via INTRAVENOUS

## 2024-07-01 MED ORDER — HYDRALAZINE HCL 20 MG/ML IJ SOLN
2.0000 mg | Freq: Three times a day (TID) | INTRAMUSCULAR | Status: DC | PRN
  Administered 2024-07-02: 2 mg via INTRAVENOUS
  Filled 2024-07-01: qty 1

## 2024-07-01 MED ORDER — INSULIN ASPART 100 UNIT/ML IJ SOLN
0.0000 [IU] | INTRAMUSCULAR | Status: AC | PRN
  Administered 2024-07-01: 4 [IU] via SUBCUTANEOUS
  Administered 2024-07-01: 3 [IU] via SUBCUTANEOUS
  Filled 2024-07-01 (×2): qty 0.07
  Filled 2024-07-01: qty 4

## 2024-07-01 MED ORDER — DEXAMETHASONE SOD PHOSPHATE PF 10 MG/ML IJ SOLN
INTRAMUSCULAR | Status: AC
  Filled 2024-07-01: qty 1

## 2024-07-01 MED ORDER — ACETAMINOPHEN 10 MG/ML IV SOLN
INTRAVENOUS | Status: DC | PRN
  Administered 2024-07-01: 1000 mg via INTRAVENOUS

## 2024-07-01 MED ORDER — CEFAZOLIN SODIUM-DEXTROSE 2-4 GM/100ML-% IV SOLN
INTRAVENOUS | Status: AC
  Filled 2024-07-01: qty 100

## 2024-07-01 MED ORDER — 0.9 % SODIUM CHLORIDE (POUR BTL) OPTIME
TOPICAL | Status: DC | PRN
  Administered 2024-07-01: 1000 mL

## 2024-07-01 MED ORDER — TRANEXAMIC ACID-NACL 1000-0.7 MG/100ML-% IV SOLN
1000.0000 mg | INTRAVENOUS | Status: AC
  Administered 2024-07-01: 1000 mg via INTRAVENOUS

## 2024-07-01 MED ORDER — MORPHINE SULFATE (PF) 2 MG/ML IV SOLN
0.5000 mg | INTRAVENOUS | Status: DC | PRN
  Administered 2024-07-02: 0.5 mg via INTRAVENOUS
  Filled 2024-07-01: qty 1

## 2024-07-01 MED ORDER — ONDANSETRON HCL 4 MG PO TABS: 4.0000 mg | ORAL_TABLET | Freq: Four times a day (QID) | ORAL | Status: DC | PRN

## 2024-07-01 MED ORDER — ASPIRIN 325 MG PO TBEC
325.0000 mg | DELAYED_RELEASE_TABLET | Freq: Every day | ORAL | Status: DC
  Administered 2024-07-03: 325 mg via ORAL
  Filled 2024-07-01 (×3): qty 1

## 2024-07-01 MED ORDER — FENTANYL CITRATE (PF) 250 MCG/5ML IJ SOLN
INTRAMUSCULAR | Status: DC | PRN
  Administered 2024-07-01 (×3): 25 ug via INTRAVENOUS

## 2024-07-01 MED ORDER — METOCLOPRAMIDE HCL 5 MG/ML IJ SOLN: 5.0000 mg | Freq: Three times a day (TID) | INTRAMUSCULAR | Status: DC | PRN

## 2024-07-01 MED ORDER — HYDRALAZINE HCL 25 MG PO TABS: 25.0000 mg | ORAL_TABLET | Freq: Three times a day (TID) | ORAL | Status: DC | PRN

## 2024-07-01 MED ORDER — ALBUMIN HUMAN 5 % IV SOLN: INTRAVENOUS | Status: DC | PRN

## 2024-07-01 MED ORDER — CHLORHEXIDINE GLUCONATE 4 % EX SOLN: 60.0000 mL | Freq: Once | CUTANEOUS | Status: DC

## 2024-07-01 NOTE — Progress Notes (Signed)
 PT Cancellation Note  Patient Details Name: Justin Salinas MRN: [id number] DOB: [birth year]   Cancelled Treatment:    Reason Eval/Treat Not Completed: Patient at procedure or test/unavailable. Pt in OR. Will follow up tomorrow.   Rodgers ORN [facility] 07/01/2024, 8:21 AM Rodgers Opal PT Acute [employer] [phone]

## 2024-07-01 NOTE — Progress Notes (Signed)
 Pt came down from 5C-18 for surgery with a small-moderate amount of food particles still in his mouth. The pt is confused, but does follow commands. The day shift nurse stated that he had and no food after midnight, and he had no breakfast this am. The daughter states he normally can swallow fine. Swallow Eval order placed for further investigation and safety for the pt. Daughter Justin Salinas at the bedside, and made aware.

## 2024-07-01 NOTE — Progress Notes (Addendum)
 "  HD#1 SUBJECTIVE:  Patient Summary: Justin Salinas is an 84 year old male with a past medical history of dementia, CAD s/p CABG, hypertension, type 2 diabetes, CKD stage III, HFpEF, and recurrent falls who presents with altered mental status and recent falls with decreased left leg movement from [facility] and admitted for L femur fracture and surgical intervention.   Overnight Events: no overnight events  Interim History: Patient assessed beside with two sons in room. Patient resting comfortably post-op. Sons stated that patient has been declining since pneumonia several months ago. Several days ago patient could not recognize family and today after post-op he is drowsy but did not recognize sons. Patient mumbling with responses to questions but only answering when posed a question.   OBJECTIVE:  Vital Signs: Vitals:   07/01/24 0038 07/01/24 0553 07/01/24 0620 07/01/24 0808  BP: (!) 165/83 (!) 197/64 (!) 167/67 (!) 172/75  Pulse: 71 78  85  Resp: 18 18  20   Temp: (!) 97.5 F (36.4 C) 97.7 F (36.5 C)  98.4 F (36.9 C)  TempSrc: Oral Oral  Oral  SpO2: 97% 99%  100%  Weight:  75 kg    Height:       Supplemental O2: Room Air SpO2: 100 %  Filed Weights   06/30/24 1037 07/01/24 0553  Weight: 75.3 kg 75 kg     Intake/Output Summary (Last 24 hours) at 07/01/2024 1003 Last data filed at 07/01/2024 0947 Gross per 24 hour  Intake 200 ml  Output 550 ml  Net -350 ml   Net IO Since Admission: -350 mL [07/01/24 1003]  Physical Exam: Physical Exam Constitutional:      General: He is not in acute distress.    Appearance: He is not ill-appearing, toxic-appearing or diaphoretic.  Cardiovascular:     Rate and Rhythm: Normal rate and regular rhythm.     Heart sounds: Normal heart sounds. No murmur heard.    No friction rub. No gallop.  Pulmonary:     Effort: Pulmonary effort is normal.     Breath sounds: Normal breath sounds. No wheezing or rales.     Comments: Anterior  lung fields  Abdominal:     Palpations: Abdomen is soft.  Skin:    General: Skin is warm and dry.  Neurological:     Comments: Alerts to voice, not able to make coherent sounds but makes sounds to questions. Follows commands such as open your eyes and open your mouth.      Patient Lines/Drains/Airways Status     Active Line/Drains/Airways     Name Placement date Placement time Site Days   Peripheral IV 07/01/24 20 G Right Antecubital 07/01/24  0639  Antecubital  less than 1   Airway 7 mm 07/01/24  0937  -- less than 1   Wound 07/01/24 0924 Surgical Closed Surgical Incision Hip Right 07/01/24  0924  Hip  less than 1   Wound 07/01/24 0924 Surgical Closed Surgical Incision Elbow Right 07/01/24  0924  Elbow  less than 1            Pertinent labs and imaging:      Latest Ref Rng & Units 07/01/2024    5:40 AM 06/30/2024   10:54 AM 06/30/2024   10:41 AM  CBC  WBC 4.0 - 10.5 K/uL 11.6   11.0   Hemoglobin 13.0 - 17.0 g/dL 87.8  87.3  87.5   Hematocrit 39.0 - 52.0 % 36.5  37.0  37.0  Platelets 150 - 400 K/uL 199   209        Latest Ref Rng & Units 07/01/2024    5:40 AM 06/30/2024    2:47 PM 06/30/2024   10:54 AM  CMP  Glucose 70 - 99 mg/dL 729  612  690   BUN 8 - 23 mg/dL 40  43  45   Creatinine 0.61 - 1.24 mg/dL 8.52  8.40  8.29   Sodium 135 - 145 mmol/L 141  140  142   Potassium 3.5 - 5.1 mmol/L 4.6  5.2  4.3   Chloride 98 - 111 mmol/L 109  106  107   CO2 22 - 32 mmol/L 22  22    Calcium  8.9 - 10.3 mg/dL 9.4  9.4      CT Cervical Spine Wo Contrast Result Date: 06/30/2024 EXAM: CT CERVICAL SPINE WITHOUT CONTRAST 06/30/2024 11:50:00 AM TECHNIQUE: CT of the cervical spine was performed without the administration of intravenous contrast. Multiplanar reformatted images are provided for review. Automated exposure control, iterative reconstruction, and/or weight based adjustment of the mA/kV was utilized to reduce the radiation dose to as low as reasonably achievable. COMPARISON:  CT cervical spine 06/17/2024. CLINICAL HISTORY: 84 year old male with altered mental status. FINDINGS: BONES AND ALIGNMENT: Redemonstrated fusion of C1 to the skull base, variant. No acute fracture or traumatic malalignment of the cervical spine. DEGENERATIVE CHANGES: There is multilevel intervertebral disc height loss. Multilevel uncovertebral spurring and facet arthropathy with associated neuroforaminal stenosis. SOFT TISSUES: No prevertebral soft tissue swelling. Bilateral carotid calcifications. IMPRESSION: 1. No acute fracture or traumatic malalignment of the cervical spine. Electronically signed by: Prentice Spade MD 06/30/2024 12:27 PM EST RP Workstation: [id number]   CT Head Wo Contrast Result Date: 06/30/2024 EXAM: CT HEAD WITHOUT CONTRAST 06/30/2024 11:50:00 AM TECHNIQUE: CT of the head was performed without the administration of intravenous contrast. Automated exposure control, iterative reconstruction, and/or weight based adjustment of the mA/kV was utilized to reduce the radiation dose to as low as reasonably achievable. COMPARISON: CT head 06/18/2024 and MRI brain 06/14/2024. CLINICAL HISTORY: Mental status change, unknown cause. FINDINGS: BRAIN AND VENTRICLES: Overall similar volume loss and moderate white matter changes. Resolved subscentimeter right parafalcine subdural hematoma. No new or increasing intracranial hemorrhage. No evidence of acute infarct. No hydrocephalus. No extra-axial collection. No mass effect or midline shift. ORBITS: No acute abnormality. SINUSES: Chronic left maxillary sinusitis. SOFT TISSUES AND SKULL: Redemonstrated metallic right posterior calvarial implants with associated streak artifact limiting adjacent evaluation. No acute soft tissue abnormality. No skull fracture. IMPRESSION: 1. No acute or increasing intracranial hemorrhage. No mass effect. 2. Resolved subcentimeter right parafalcine subdural hematoma since 06/18/2024. Electronically signed by: Prentice Spade MD  06/30/2024 12:12 PM EST RP Workstation: [id number]   DG Chest 1 View Result Date: 06/30/2024 EXAM: 1 VIEW(S) XRAY OF THE CHEST 06/30/2024 11:27:42 AM COMPARISON: 06/17/2024 CLINICAL HISTORY: 84 year old male with multiple recent falls and weakness. FINDINGS: LUNGS AND PLEURA: No focal pulmonary opacity. No pleural effusion. No pneumothorax. HEART AND MEDIASTINUM: Post CABG changes. Aortic arch atherosclerosis. BONES AND SOFT TISSUES: Sternotomy wires noted. No acute osseous abnormality. IMPRESSION: 1. No acute cardiopulmonary abnormality. 2. Aortic Atherosclerosis (ICD10-I70.0). Electronically signed by: Helayne Hurst MD 06/30/2024 11:49 AM EST RP Workstation: [id number]   DG Pelvis 1-2 Views Result Date: 06/30/2024 EXAM: 1 or 2 VIEW(S) XRAY OF THE PELVIS 06/30/2024 11:27:42 AM COMPARISON: Pelvis CT 09/07/2020. CLINICAL HISTORY: 84 year old male with multiple recent falls and weakness. FINDINGS: BONES AND JOINTS: L5-S1 surgical hardware noted.  Impacted left femoral neck fracture. No malalignment. SOFT TISSUES: Vascular calcifications. Unremarkable. IMPRESSION: 1. Positive for Impacted left femoral neck fracture. Electronically signed by: Helayne Hurst MD 06/30/2024 11:48 AM EST RP Workstation: [id number]   DG Femur Min 2 Views Left Result Date: 06/30/2024 CLINICAL DATA:  Fall with left hip pain. EXAM: LEFT FEMUR 2 VIEWS COMPARISON:  None Available. FINDINGS: There is evidence of an acute left subcapital hip fracture with mild displacement. There is no evidence of dislocation. No underlying bony lesions identified. Calcifications of the femoral arteries. Mild osteoarthritis of the left hip and knee joints. IMPRESSION: Acute left subcapital hip fracture with mild displacement. Electronically Signed   By: Marcey Moan M.D.   On: 06/30/2024 11:46    ASSESSMENT/PLAN:  Assessment: Principal Problem:   Femur fracture (HCC) Active Problems:   Controlled type 2 diabetes mellitus, with long-term current use of  insulin  (HCC)   Essential hypertension   S/P CABG x 4   CKD stage 3 due to type 2 diabetes mellitus (HCC)   AKI (acute kidney injury)   Dementia with behavioral disturbance (HCC)   Acute encephalopathy   SDH (subdural hematoma) (HCC)   Fall   Plan: Impacted left femoral neck fracture Left subcapital hip fracture with mild displacement Fall Surgery 07/01/2024.  - Appreciate orthopedic recs.  - weightbearing as tolerated to the left lower extremity.   - postoperative Ancef  - Aspirin  for DVT prophylaxis - mobilize with physical and Occupational Therapy.   - There is no hip precautions. - speech and swallow eval ; holding meds until swallow with orders to night team to add zyprexa  PRN  - Tylenol  1000 mg q 8 hours  - Lidocaine  patch   Difficulty swallowing  Patient was noted to have food in mouth from previous day. Son stated there was concern that patient was having more difficulty swallowing recently (taking more effort). Concern for aspiration risk.  - speech and swallow eval  - NPO until eval   Acute encephalopathy with underlying dementia Recent subdural hematoma, resolved on CT 06/30/2024 - Delirium precautions - seroquel  25 mg nightly - sertraline  100 mg daily  - melatonin 3 mg nightly   AKI on CKD stage III - improved  Cr on last admission 1.41, Cr this admission 1.67; likely due to poor oral intake after recurrent falls and painful injury with hip fracture. Received IV fluids in ED. Cr 1.47 - continue to monitor   HTN CAD s/p CABG - amlodipine  10 mg daily  - Coreg  12.5 mg BID  - spironolactone  25 mg daily  - Rosuvastatin  20 mg daily  - ASA 81 mg daily   Type 2 diabetes Hemoglobin A1c 6.9 on 06/13/2024.  He continues on basal bolus insulin . - 10 units insulin  glargine nightly - sensitive sliding scale  Constipation  - Senna 8.6 mg BID - Miralax  17 g oral daily   Urinary Hesitancy  - finasteride  5 mg daily  - tamsulosin  0.4 mg daily   Nutritional  Products - Vit C and Vit D3  Respiratory Agents  - flonase   - claritin     Best Practice: Diet: pending swallow eval  IVF: Fluids: none, Rate: None VTE: heparin  injection 5,000 Units Start: 06/30/24 1400 Code: DNR limited   Disposition planning: Therapy Recs: SNF, DME: other pending eval after surgery  Family Contact: daughter, to be notified. DISPO: Anticipated discharge pending clinical improvement  Signature:  [facility]  10:03 AM, 07/01/2024  On Call pager [phone]  "

## 2024-07-01 NOTE — Progress Notes (Signed)
 Transition of Care [facility]) - CAGE-AID Screening   Patient Details  Name: Justin Salinas MRN: [id number] Date of Birth: 1941/04/30  Transition of Care [facility]) CM/SW Contact:    Bernardino Mayotte, RN Phone Number: 07/01/2024, 6:36 AM   Clinical Narrative:  Patient denies the use of alcohol and illicit substances. Resources not given at this time.   CAGE-AID Screening:    Have You Ever Felt You Ought to Cut Down on Your Drinking or Drug Use?: No Have People Annoyed You By Critizing Your Drinking Or Drug Use?: No Have You Felt Bad Or Guilty About Your Drinking Or Drug Use?: No Have You Ever Had a Drink or Used Drugs First Thing In The Morning to Steady Your Nerves or to Get Rid of a Hangover?: No CAGE-AID Score: 0  Substance Abuse Education Offered: No

## 2024-07-01 NOTE — Progress Notes (Signed)
 OT Cancellation Note  Patient Details Name: Justin Salinas MRN: [id number] DOB: February 04, 1941   Cancelled Treatment:    Reason Eval/Treat Not Completed: Patient at procedure or test/ unavailable. Pt off floor for L hip hemiarthroplasty. OT to follow up as medically appropriate and schedule allows.  Maurilio CROME, OTR/LSABRA  [facility]  Office: [phone]   [name] 07/01/2024, 8:06 AM

## 2024-07-01 NOTE — Progress Notes (Signed)
 Patient left for procedure. Per nightshift CHG bath completed. Remained NPO after midnight. Patient unable to sign consent r/t mentation status. Unable to complete assessment at this time. Sherline Jubilee

## 2024-07-01 NOTE — Interval H&P Note (Signed)
 History and Physical Interval Note:  07/01/2024 8:41 AM  Justin Salinas  has presented today for surgery, with the diagnosis of Left Hip Fracture.  The various methods of treatment have been discussed with the patient and family. After consideration of risks, benefits and other options for treatment, the patient has consented to  Procedures: HEMIARTHROPLASTY, HIP, DIRECT ANTERIOR APPROACH, FOR FRACTURE (Left) as a surgical intervention.  The patient's history has been reviewed, patient examined, no change in status, stable for surgery.  I have reviewed the patient's chart and labs.  Questions were answered to the patient's satisfaction.     Marigene Erler P Xylina Rhoads

## 2024-07-01 NOTE — Inpatient Diabetes Management (Signed)
 Inpatient Diabetes Program Recommendations  AACE/ADA: New Consensus Statement on Inpatient Glycemic Control   Target Ranges:  Prepandial:   less than 140 mg/dL      Peak postprandial:   less than 180 mg/dL (1-2 hours)      Critically ill patients:  140 - 180 mg/dL    Latest Reference Range & Units 06/30/24 16:16 06/30/24 21:06 07/01/24 08:02  Glucose-Capillary 70 - 99 mg/dL 653 (H) 769 (H) 728 (H)   Review of Glycemic Control  Diabetes history: DM2 Outpatient Diabetes medications: Lantus  8 units at bedtime, Humalog 0-10 units TID with meals, Prandin  0.5 mg TID Current orders for Inpatient glycemic control: Semglee  8 units at bedtime, Novolog  0-9 units TID with meals, Prandin  0.5 mg TID  Inpatient Diabetes Program Recommendations:    Insulin : CBG 271 mg/dl this morning and patient is NPO in PO area for surgery today.  Please consider increasing Semglee  to 10 units at bedtime.  Thanks, Earnie Gainer, RN, MSN, CDCES Diabetes Coordinator Inpatient Diabetes Program [phone] (Team Pager from 8am to 5pm)

## 2024-07-01 NOTE — Op Note (Signed)
 Orthopaedic Surgery Operative Note (CSN: [id number] ) Date of Surgery: 07/01/2024  Admit Date: 06/30/2024   Diagnoses: Pre-Op Diagnoses: Left displaced femoral neck fracture  Post-Op Diagnosis: Same  Procedures: CPT 27236-Left hip hemiarthroplasty for femoral neck fracture  Surgeons : Primary: Kendal Franky SQUIBB, MD  Assistant: Lauraine Moores, PA-C  Location: OR 3   Anesthesia: General   Antibiotics: Ancef  2g preop with 1 gm vancomycin  powder placed topically   Tourniquet time: None    Estimated Blood Loss: 200 mL  Complications:* No complications entered in OR log *   Specimens:* No specimens in log *   Implants: Implant Name Type Inv. Item Serial No. Manufacturer Lot No. LRB No. Used Action  STEM FEM CMTLS 13 146 - [id number] Stem STEM FEM CMTLS 13 146  ZIMMER [employer]ON(ORTH,TRAU,BIO,SG) [id number] Left 1 Implanted  SHELL RINGBLOC BI POLR 28X52MM - [id number] Orthopedic Implant SHELL RINGBLOC BI POLR 28X52MM  ZIM[employer]ON(ORTH,TRAU,BIO,SG) [id number] Left 1 Implanted  HEAD MOD COCR HD - NK - [id number] Orthopedic Implant HEAD MOD COCR HD - NK  Z[employer]MER [employer]ON(ORTH,TRAU,BIO,SG) [id number] Left 1 Implanted     Indications for Surgery: 84 year old male with a history of dementia who sustained a ground-level fall with a left displaced femoral neck fracture.  Due to the unstable nature of his injury I recommend proceeding with left hip hemiarthroplasty.  Risks and benefits were discussed with the patient's daughter.  Risks include but not limited to bleeding, infection, hip dislocation, periprosthetic fracture, leg length discrepancy, nerve and blood vessel injury, DVT, even the possibility anesthetic complications.  She agreed to proceed with surgery and consent was obtained.  Operative Findings: 1.  Left hip hemiarthroplasty through anterior approach using [employer] Taperloc reduced distal high offset size 13 uncemented stem 2.  [employer] 52 mm outer diameter  acetabular cup 3.  [employer] modular head component with a -3 neck and a 28 mm diameter  Procedure: The patient was identified in the preoperative holding area. Consent was confirmed with the patient and their family and all questions were answered. The operative extremity was marked after confirmation with the patient. he was then brought back to the operating room by our anesthesia colleagues.  He was placed under general anesthetic and carefully transferred over to a Hana table.  Fluoroscopic imaging showed the unstable nature of his injury.  The left hip was then prepped and draped in usual sterile fashion.  A timeout was performed to verify the patient, the procedure, and the extremity.  Preoperative antibiotics were dosed.  A standard anterior approach to the hip was made and carried down through skin and subcutaneous tissue.  I incised through the TFL fascia and developed the interval between the TFL and sartorius.  I expose the anterior hip capsule by cauterizing the crossing vessels.  I then performed a capsulotomy and then released the capsule all the way down to the inferior neck.  I then made an intercalary neck cut.  I removed the femoral head with a corkscrew.  I sized the head and and measured 52 mm.  I then trialed the head and had good suction fit.  I then externally rotated the femur to release more of the inferior capsule.  I then delivered the femur into the incision.  I performed a superior capsular release to adequately expose the proximal femur for broaching.  I then used a canal finder to enter the canal.  I then sequentially broached until I reached a size 13 which had good  proximal fill and rotational control.  I then trialed a standard offset with a -3 neck with a 52 mm acetabular cup.  The leg lengths were nearly symmetric but there was some diminished offset.  I dislocated the hip removed the trial implants and placed the size 13 high offset stem to get a little bit more  offset.  The stem sat a little bit more proud than my trial implants and so I decided to just place the -3 mm neck with the 52 mm cup.  They was reduced final fluoroscopic imaging was obtained.  The incisions were irrigated.  A gram of vancomycin  powder was placed into the incision.  The hip capsule was closed with #1 Ethibond.  The TFL fascia was closed with #1 Vicryl.  The skin was closed with 2-0 Monocryl 3-0 Monocryl and Dermabond.  Sterile dressings were applied.  The patient was then awoke from anesthesia and taken to the PACU in stable condition.  Post Op Plan/Instructions: Patient will be weightbearing as tolerated to the left lower extremity.  He will receive postoperative Ancef .  He will be placed on aspirin  for DVT prophylaxis.  Will have him mobilize with physical and Occupational Therapy.  There is no hip precautions.  I was present and performed the entire surgery.  Lauraine Moores, PA-C did assist me throughout the case. An assistant was necessary given the difficulty in approach, maintenance of reduction and ability to instrument the fracture.   Franky Light, MD [facility]

## 2024-07-01 NOTE — Transfer of Care (Signed)
 Immediate Anesthesia Transfer of Care Note  Patient: Justin Salinas  Procedure(s) Performed: LEFT HIP HEMIARTHROPLASTY (Left: Hip)  Patient Location: PACU  Anesthesia Type:General  Level of Consciousness: awake and sedated  Airway & Oxygen Therapy: Patient Spontanous Breathing and Patient connected to face mask oxygen  Post-op Assessment: Report given to RN and Post -op Vital signs reviewed and stable  Post vital signs: Reviewed and stable  Last Vitals:  Vitals Value Taken Time  BP 198/80 07/01/24 11:17  Temp 36.6 C 07/01/24 11:17  Pulse 78 07/01/24 11:25  Resp 15 07/01/24 11:25  SpO2 100 % 07/01/24 11:25  Vitals shown include unfiled device data.  Last Pain:  Vitals:   07/01/24 1117  TempSrc:   PainSc: 0-No pain      Patients Stated Pain Goal: 0 (07/01/24 9191)  Complications: No notable events documented.

## 2024-07-01 NOTE — Anesthesia Preprocedure Evaluation (Addendum)
"                                    Anesthesia Evaluation  Patient identified by MRN, date of birth, ID band Patient awake    Reviewed: Allergy & Precautions, H&P , NPO status , Patient's Chart, lab work & pertinent test results  Airway Mallampati: II   Neck ROM: full    Dental   Pulmonary former smoker   breath sounds clear to auscultation       Cardiovascular hypertension, Pt. on home beta blockers and Pt. on medications + CAD, + Past MI and + CABG   Rhythm:regular Rate:Normal  ECHO:  (2024) 1. Left ventricular ejection fraction, by estimation, is 60 to 65%. The  left ventricle has normal function. Left ventricular endocardial border  not optimally defined to evaluate regional wall motion. There is moderate  asymmetric left ventricular  hypertrophy of the septal segment. Left ventricular diastolic parameters  are consistent with Grade I diastolic dysfunction (impaired relaxation).   2. Right ventricular systolic function is normal. The right ventricular  size is normal. Tricuspid regurgitation signal is inadequate for assessing  PA pressure.   3. The mitral valve was not well visualized. No evidence of mitral valve  regurgitation. Moderate to severe mitral annular calcification.   4. The aortic valve was not well visualized. Aortic valve regurgitation  is not visualized.     Neuro/Psych  PSYCHIATRIC DISORDERS     Dementia negative neurological ROS     GI/Hepatic Neg liver ROS,GERD  ,,  Endo/Other  diabetes, Insulin  Dependent    Renal/GU Renal disease     Musculoskeletal negative musculoskeletal ROS (+)    Abdominal   Peds  Hematology  (+) Blood dyscrasia, anemia INR: 1.3   Anesthesia Other Findings Left Hip Fracture  Reproductive/Obstetrics                              Anesthesia Physical Anesthesia Plan  ASA: 3  Anesthesia Plan: General   Post-op Pain Management:    Induction: Intravenous  PONV Risk Score  and Plan: 2 and Ondansetron , Dexamethasone  and Treatment may vary due to age or medical condition  Airway Management Planned: Oral ETT  Additional Equipment:   Intra-op Plan:   Post-operative Plan: Extubation in OR  Informed Consent: I have reviewed the patients History and Physical, chart, labs and discussed the procedure including the risks, benefits and alternatives for the proposed anesthesia with the patient or authorized representative who has indicated his/her understanding and acceptance.   Patient has DNR.  Discussed DNR with power of attorney and Suspend DNR.   Dental advisory given  Plan Discussed with: CRNA, Anesthesiologist and Surgeon  Anesthesia Plan Comments:          Anesthesia Quick Evaluation  "

## 2024-07-01 NOTE — Anesthesia Procedure Notes (Signed)
 Procedure Name: Intubation Date/Time: 07/01/2024 9:37 AM  Performed by: Corinne Garnette BRAVO, MDPre-anesthesia Checklist: Patient identified, Emergency Drugs available, Suction available and Patient being monitored Patient Re-evaluated:Patient Re-evaluated prior to induction Oxygen Delivery Method: Circle system utilized Preoxygenation: Pre-oxygenation with 100% oxygen Induction Type: IV induction Ventilation: Mask ventilation without difficulty Laryngoscope Size: Mac and 4 Grade View: Grade III Tube type: Oral Tube size: 7.0 mm Number of attempts: 1 Airway Equipment and Method: Stylet and Oral airway Placement Confirmation: ETT inserted through vocal cords under direct vision, positive ETCO2 and breath sounds checked- equal and bilateral Secured at: 23 cm Tube secured with: Tape Dental Injury: Teeth and Oropharynx as per pre-operative assessment  Difficulty Due To: Difficult Airway- due to reduced neck mobility Future Recommendations: Recommend- induction with short-acting agent, and alternative techniques readily available

## 2024-07-02 ENCOUNTER — Encounter (HOSPITAL_COMMUNITY): Payer: Self-pay | Admitting: Student

## 2024-07-02 LAB — BASIC METABOLIC PANEL WITH GFR
Anion gap: 12 (ref 5–15)
BUN: 38 mg/dL — ABNORMAL HIGH (ref 8–23)
CO2: 22 mmol/L (ref 22–32)
Calcium: 9.1 mg/dL (ref 8.9–10.3)
Chloride: 108 mmol/L (ref 98–111)
Creatinine, Ser: 1.55 mg/dL — ABNORMAL HIGH (ref 0.61–1.24)
GFR, Estimated: 44 mL/min — ABNORMAL LOW
Glucose, Bld: 280 mg/dL — ABNORMAL HIGH (ref 70–99)
Potassium: 5 mmol/L (ref 3.5–5.1)
Sodium: 142 mmol/L (ref 135–145)

## 2024-07-02 LAB — CBC
HCT: 36.3 % — ABNORMAL LOW (ref 39.0–52.0)
Hemoglobin: 11.6 g/dL — ABNORMAL LOW (ref 13.0–17.0)
MCH: 31.8 pg (ref 26.0–34.0)
MCHC: 32 g/dL (ref 30.0–36.0)
MCV: 99.5 fL (ref 80.0–100.0)
Platelets: 217 10*3/uL (ref 150–400)
RBC: 3.65 MIL/uL — ABNORMAL LOW (ref 4.22–5.81)
RDW: 12.7 % (ref 11.5–15.5)
WBC: 11.4 10*3/uL — ABNORMAL HIGH (ref 4.0–10.5)
nRBC: 0 % (ref 0.0–0.2)

## 2024-07-02 LAB — GLUCOSE, CAPILLARY
Glucose-Capillary: 287 mg/dL — ABNORMAL HIGH (ref 70–99)
Glucose-Capillary: 191 mg/dL — ABNORMAL HIGH (ref 70–99)
Glucose-Capillary: 244 mg/dL — ABNORMAL HIGH (ref 70–99)
Glucose-Capillary: 187 mg/dL — ABNORMAL HIGH (ref 70–99)

## 2024-07-02 MED ORDER — MORPHINE SULFATE (PF) 2 MG/ML IV SOLN: 0.5000 mg | INTRAVENOUS | Status: DC | PRN

## 2024-07-02 MED ORDER — ASPIRIN 325 MG PO TBEC: 325.0000 mg | DELAYED_RELEASE_TABLET | Freq: Every day | ORAL | Status: AC

## 2024-07-02 MED ORDER — OXYCODONE HCL 5 MG PO TABS: 2.5000 mg | ORAL_TABLET | ORAL | 0 refills | Status: AC | PRN

## 2024-07-02 MED ORDER — METHOCARBAMOL 500 MG PO TABS: 500.0000 mg | ORAL_TABLET | Freq: Three times a day (TID) | ORAL | Status: AC | PRN

## 2024-07-02 MED ORDER — OLANZAPINE 10 MG IM SOLR
2.5000 mg | Freq: Once | INTRAMUSCULAR | Status: AC
  Administered 2024-07-02: 2.5 mg via INTRAMUSCULAR
  Filled 2024-07-02: qty 10

## 2024-07-02 NOTE — Inpatient Diabetes Management (Signed)
 Inpatient Diabetes Program Recommendations  AACE/ADA: New Consensus Statement on Inpatient Glycemic Control (2015)  Target Ranges:  Prepandial:   less than 140 mg/dL      Peak postprandial:   less than 180 mg/dL (1-2 hours)      Critically ill patients:  140 - 180 mg/dL    Latest Reference Range & Units 07/01/24 08:02 07/01/24 10:12 07/01/24 11:20 07/01/24 12:08 07/01/24 15:36 07/01/24 18:15 07/01/24 20:15  Glucose-Capillary 70 - 99 mg/dL 728 (H)  4 units Novolog   5 mg Decadron  at 0953  249 (H)  3 units Novolog   240 (H) 224 (H)  3 units Novolog   219 (H) 258 (H)  5 units Novolog   244 (H)   10 units Semglee   (H): Data is abnormally high  Latest Reference Range & Units 07/02/24 07:51 07/02/24 11:15  Glucose-Capillary 70 - 99 mg/dL 712 (H)  5 units Novolog   191 (H)  2 units Novolog    (H): Data is abnormally high    Home DM Meds: Lantus  8 units at bedtime Humalog 0-10 units TID with meals Prandin  0.5 mg TID    Current Orders: Semglee  10 units at HS Novolog  Sensitive Correction Scale/ SSI (0-9 units) TID AC Prandin  0.5 mg TID    Note pt received 5 mg Decadron  at 0953 yest AM  Lantus  increased to 10 units at HS last PM  MD- If AM CBGs stay elevated, may consider increasing the Lantus  further  May also consider starting Novolog  Meal Coverage: Novolog  3 units TID with meals HOLD if pt NPO HOLD if pt eats <50% meals   --Will follow patient during hospitalization--  Adina Rudolpho Arrow RN, MSN, CDCES Diabetes Coordinator Inpatient Glycemic Control Team Team Pager: [phone] (8a-5p)

## 2024-07-02 NOTE — Evaluation (Signed)
 Occupational Therapy Evaluation Patient Details Name: Justin Salinas MRN: [id number] DOB: March 09, 1941 Today's Date: 07/02/2024   History of Present Illness   84 y.o. male presents 1/27 to [facility] s/p fall.  L hip anterior approach arthroplasty.  Recent hospitalizatin 06/17/24 from ALF with acute agitation, AMS, and reported seizure-like activity. Two prior falls at facility. CT head showed acute 4mm R parafalcine SDH w/o midline shift.  Prior admit 1/10 with acute encephalopathy. PMH: CAD s/p CABG x4, DMII, HTN, BPH, lumbar radiculopathy, chronic diastolic heart failure, dementia.     Clinical Impressions Patient admitted for the diagnosis above.  Patient is from a local ALF, stated he has a RW.  Uncertain about ADL support.  Currently needing Mod A for bed mobility and sit to stand, once up on his feet, Min A for pivotal steps to the recliner.  Oral care this date with supervision.  OT will continue efforts, Patient will benefit from continued inpatient follow up therapy, <3 hours/day.     If plan is discharge home, recommend the following:   A lot of help with walking and/or transfers;Two people to help with walking and/or transfers;A lot of help with bathing/dressing/bathroom;Assistance with feeding     Functional Status Assessment   Patient has had a recent decline in their functional status and/or demonstrates limited ability to make significant improvements in function in a reasonable and predictable amount of time     Equipment Recommendations   None recommended by OT     Recommendations for Other Services         Precautions/Restrictions   Precautions Precautions: Fall Recall of Precautions/Restrictions: Impaired Restrictions Weight Bearing Restrictions Per Provider Order: Yes LLE Weight Bearing Per Provider Order: Weight bearing as tolerated     Mobility Bed Mobility Overal bed mobility: Needs Assistance Bed Mobility: Supine to Sit     Supine to sit: Mod  assist          Transfers Overall transfer level: Needs assistance Equipment used: Rolling walker (2 wheels) Transfers: Sit to/from Stand, Bed to chair/wheelchair/BSC Sit to Stand: Mod assist     Step pivot transfers: Min assist            Balance Overall balance assessment: Needs assistance, History of Falls Sitting-balance support: No upper extremity supported, Feet supported Sitting balance-Leahy Scale: Fair     Standing balance support: Reliant on assistive device for balance Standing balance-Leahy Scale: Poor                             ADL either performed or assessed with clinical judgement   ADL Overall ADL's : Needs assistance/impaired Eating/Feeding: Set up;Sitting   Grooming: Oral care;Supervision/safety   Upper Body Bathing: Set up;Sitting   Lower Body Bathing: Moderate assistance;Sitting/lateral leans   Upper Body Dressing : Supervision/safety;Sitting   Lower Body Dressing: Moderate assistance;Sit to/from stand   Toilet Transfer: Minimal assistance;Moderate assistance;Stand-pivot;BSC/3in1                   Vision   Vision Assessment?: No apparent visual deficits     Perception Perception: Not tested       Praxis Praxis: Not tested       Pertinent Vitals/Pain Pain Assessment Pain Assessment: Faces Faces Pain Scale: Hurts little more Pain Location: L hip Pain Descriptors / Indicators: Sore Pain Intervention(s): Monitored during session     Extremity/Trunk Assessment Upper Extremity Assessment Upper Extremity Assessment: Overall WFL for tasks assessed;Right  hand dominant   Lower Extremity Assessment Lower Extremity Assessment: Defer to PT evaluation   Cervical / Trunk Assessment Cervical / Trunk Assessment: Kyphotic   Communication Communication Communication: No apparent difficulties Factors Affecting Communication: Hearing impaired   Cognition Arousal: Alert Behavior During Therapy: WFL for tasks  assessed/performed, Flat affect Cognition: Cognition impaired   Orientation impairments: Place, Time, Situation   Memory impairment (select all impairments): Short-term memory, Working civil service fast streamer, Conservation officer, historic buildings Attention impairment (select first level of impairment): Sustained attention Executive functioning impairment (select all impairments): Initiation, Sequencing, Problem solving                   Following commands: Intact Following commands impaired: Only follows one step commands consistently     Cueing  General Comments   Cueing Techniques: Verbal cues;Gestural cues;Tactile cues   No dizziness reported   Exercises     Shoulder Instructions      Home Living Family/patient expects to be discharged to:: Skilled nursing facility                             Home Equipment: [place] - single point;Shower seat;Hand held shower head;Grab bars - toilet;Grab bars - tub/shower;Rolling Walker (2 wheels)          Prior Functioning/Environment Prior Level of Function : Patient poor historian/Family not available             Mobility Comments: Per chart review, pt uses a walker ADLs Comments: Per chart review, pt able to take self to restroom; inconsistent report with regard to other ADL    OT Problem List: Decreased strength;Decreased activity tolerance;Impaired balance (sitting and/or standing);Decreased cognition;Decreased knowledge of use of DME or AE;Decreased safety awareness   OT Treatment/Interventions: Self-care/ADL training;Therapeutic exercise;DME and/or AE instruction;Therapeutic activities;Balance training;Patient/family education;Cognitive remediation/compensation      OT Goals(Current goals can be found in the care plan section)   Acute Rehab OT Goals Patient Stated Goal: Go home OT Goal Formulation: With patient Time For Goal Achievement: 07/16/24 ADL Goals Pt Will Perform Grooming: with supervision;standing Pt Will  Perform Lower Body Dressing: with supervision;sit to/from stand;sitting/lateral leans Pt Will Transfer to Toilet: with supervision;ambulating;regular height toilet   OT Frequency:  Min 2X/week    Co-evaluation              AM-PAC OT 6 Clicks Daily Activity     Outcome Measure Help from another person eating meals?: A Little Help from another person taking care of personal grooming?: A Little Help from another person toileting, which includes using toliet, bedpan, or urinal?: A Lot Help from another person bathing (including washing, rinsing, drying)?: A Lot Help from another person to put on and taking off regular upper body clothing?: A Little Help from another person to put on and taking off regular lower body clothing?: A Lot 6 Click Score: 15   End of Session Equipment Utilized During Treatment: Rolling walker (2 wheels) Nurse Communication: Mobility status  Activity Tolerance: Patient tolerated treatment well Patient left: in chair;with call bell/phone within reach;with chair alarm set  OT Visit Diagnosis: Unsteadiness on feet (R26.81);Muscle weakness (generalized) (M62.81);Other symptoms and signs involving cognitive function                Time: 9194-9171 OT Time Calculation (min): 23 min Charges:  OT General Charges $OT Visit: 1 Visit OT Evaluation $OT Eval Moderate Complexity: 1 Mod OT Treatments $Self Care/Home Management : 8-22 mins  07/02/2024  RP, OTR/L  Acute Rehabilitation Services  Office:  [phone]   Charlie JONETTA Halsted 07/02/2024, 8:36 AM

## 2024-07-02 NOTE — Evaluation (Signed)
 Physical Therapy Evaluation Patient Details Name: Justin Salinas MRN: [id number] DOB: 26-Jul-1940 Today's Date: 07/02/2024  History of Present Illness  84 y.o. male presents 1/27 to [facility] s/p fall.  L hip anterior approach arthroplasty.  Recent hospitalizatin 06/17/24 from ALF with acute agitation, AMS, and reported seizure-like activity. Two prior falls at facility. CT head showed acute 4mm R parafalcine SDH w/o midline shift.  Prior admit 1/10 with acute encephalopathy. PMH: CAD s/p CABG x4, DMII, HTN, BPH, lumbar radiculopathy, chronic diastolic heart failure, dementia.  Clinical Impression  Pt admitted with above diagnosis. Pt is able to mobilize with mod to min assist overall with use of RW. Requires asssist and incr assist with transitions.  Pt will need post acute rehab < 3 hours day to gain strength and then can transition back to ALF.  Will follow acutely.  Pt currently with functional limitations due to the deficits listed below (see PT Problem List). Pt will benefit from acute skilled PT to increase their independence and safety with mobility to allow discharge.           If plan is discharge home, recommend the following: Assistance with cooking/housework;Direct supervision/assist for medications management;Direct supervision/assist for financial management;Assist for transportation;Help with stairs or ramp for entrance;A little help with walking and/or transfers;A little help with bathing/dressing/bathroom;Supervision due to cognitive status   Can travel by private vehicle        Equipment Recommendations Other (comment) (TBA)  Recommendations for Other Services       Functional Status Assessment Patient has had a recent decline in their functional status and demonstrates the ability to make significant improvements in function in a reasonable and predictable amount of time.     Precautions / Restrictions Precautions Precautions: Fall;Other (comment) (No hip precautions) Recall  of Precautions/Restrictions: Impaired Precaution/Restrictions Comments: SBP <160 Restrictions Weight Bearing Restrictions Per Provider Order: Yes LLE Weight Bearing Per Provider Order: Weight bearing as tolerated      Mobility  Bed Mobility               General bed mobility comments: in chair on arrival    Transfers Overall transfer level: Needs assistance Equipment used: Rolling walker (2 wheels) Transfers: Sit to/from Stand, Bed to chair/wheelchair/BSC Sit to Stand: Mod assist           General transfer comment: Able to stand to RW from chair with mod assist and min cues for hand placement.  First attempt unsteady on standing but second attempt, hands on RW and more steady.    Ambulation/Gait Ambulation/Gait assistance: Min assist Gait Distance (Feet): 70 Feet Assistive device: Rolling walker (2 wheels) Gait Pattern/deviations: Step-through pattern, Decreased stride length, Shuffle, Antalgic, Drifts right/left, Trunk flexed Gait velocity: decr Gait velocity interpretation: <1.31 ft/sec, indicative of household ambulator   General Gait Details: Pt able to ambulate with RW with mod cues for safety with use of RW as pt appears to get confused as to technique and sequencing steps and RW. Pt with decr weight shift to left LE due to some pain.  Overall, pt only needed CGA once he began walking however becomes min assist and mod cues when he is transitioning with sit to stand or stand to sit.  Poor overall safety awareness.  Stairs            Wheelchair Mobility     Tilt Bed    Modified Rankin (Stroke Patients Only)       Balance Overall balance assessment: Needs assistance, History  of Falls Sitting-balance support: No upper extremity supported, Feet supported Sitting balance-Leahy Scale: Fair     Standing balance support: Reliant on assistive device for balance, Bilateral upper extremity supported, During functional activity Standing balance-Leahy  Scale: Poor Standing balance comment: Reliant on external support and RW                             Pertinent Vitals/Pain Pain Assessment Pain Assessment: Faces Faces Pain Scale: Hurts little more Breathing: normal Negative Vocalization: none Facial Expression: smiling or inexpressive Body Language: relaxed Consolability: no need to console PAINAD Score: 0 Pain Location: L hip Pain Descriptors / Indicators: Sore Pain Intervention(s): Limited activity within patient's tolerance, Monitored during session, Repositioned    Home Living Family/patient expects to be discharged to:: Skilled nursing facility                 Home Equipment: [place] - single point;Shower seat;Hand held shower head;Grab bars - toilet;Grab bars - tub/shower;Rolling Walker (2 wheels) Additional Comments: pt unable to state, per chart review, able to self feed and toilet at baseline, assisted for IADLs    Prior Function Prior Level of Function : Patient poor historian/Family not available             Mobility Comments: Per chart review, pt uses a walker ADLs Comments: Per chart review, pt able to take self to restroom; inconsistent report with regard to other ADL     Extremity/Trunk Assessment   Upper Extremity Assessment Upper Extremity Assessment: Defer to OT evaluation    Lower Extremity Assessment Lower Extremity Assessment: LLE deficits/detail LLE: Unable to fully assess due to pain    Cervical / Trunk Assessment Cervical / Trunk Assessment: Kyphotic  Communication   Communication Communication: No apparent difficulties Factors Affecting Communication: Hearing impaired    Cognition Arousal: Alert Behavior During Therapy: WFL for tasks assessed/performed, Flat affect   PT - Cognitive impairments: History of cognitive impairments, Orientation, Awareness, Memory, Attention, Sequencing, Problem solving, Safety/Judgement                       PT - Cognition  Comments: Pt very HOH and responded yeah to most questions. Followed commands much more appropriately today. Kept stating he is in [place]. Reoriented to hospital.  did state he is in [place] end of rx. Following commands: Intact Following commands impaired: Only follows one step commands consistently     Cueing Cueing Techniques: Verbal cues, Gestural cues, Tactile cues     General Comments General comments (skin integrity, edema, etc.): VSS    Exercises Total Joint Exercises Ankle Circles/Pumps: AROM, Both, 5 reps, Supine Quad Sets: AROM, Both, 5 reps, Supine Hip ABduction/ADduction: AROM, Both, 10 reps, Supine Long Arc Quad: AROM, Both, 10 reps, Seated   Assessment/Plan    PT Assessment Patient needs continued PT services  PT Problem List Decreased strength;Decreased activity tolerance;Decreased balance;Decreased mobility;Decreased cognition;Decreased knowledge of use of DME;Decreased safety awareness       PT Treatment Interventions DME instruction;Gait training;Stair training;Therapeutic activities;Functional mobility training;Therapeutic exercise;Balance training;Neuromuscular re-education;Patient/family education    PT Goals (Current goals can be found in the Care Plan section)  Acute Rehab PT Goals Patient Stated Goal: unable to state goal PT Goal Formulation: With patient Time For Goal Achievement: 07/16/24 Potential to Achieve Goals: Good    Frequency Min 2X/week     Co-evaluation  AM-PAC PT 6 Clicks Mobility  Outcome Measure Help needed turning from your back to your side while in a flat bed without using bedrails?: A Little Help needed moving from lying on your back to sitting on the side of a flat bed without using bedrails?: A Lot Help needed moving to and from a bed to a chair (including a wheelchair)?: A Lot Help needed standing up from a chair using your arms (e.g., wheelchair or bedside chair)?: A Lot Help needed to walk in  hospital room?: A Lot Help needed climbing 3-5 steps with a railing? : Total 6 Click Score: 12    End of Session Equipment Utilized During Treatment: Gait belt Activity Tolerance: Patient tolerated treatment well;Patient limited by fatigue;Patient limited by pain Patient left: with call bell/phone within reach;in chair;with chair alarm set Nurse Communication: Mobility status PT Visit Diagnosis: Unsteadiness on feet (R26.81);Other abnormalities of gait and mobility (R26.89);Muscle weakness (generalized) (M62.81);History of falling (Z91.81)    Time: 9064-9041 PT Time Calculation (min) (ACUTE ONLY): 23 min   Charges:   PT Evaluation $PT Eval Moderate Complexity: 1 Mod PT Treatments $Gait Training: 8-22 mins PT General Charges $$ ACUTE PT VISIT: 1 Visit         Allyse Fregeau M,PT Acute Rehab Services [phone]   Stephane JULIANNA Bevel 07/02/2024, 10:05 AM

## 2024-07-02 NOTE — Anesthesia Postprocedure Evaluation (Signed)
"   Anesthesia Post Note  Patient: Justin Salinas  Procedure(s) Performed: LEFT HIP HEMIARTHROPLASTY (Left: Hip)     Patient location during evaluation: PACU Anesthesia Type: General Level of consciousness: awake and alert Pain management: pain level controlled Vital Signs Assessment: post-procedure vital signs reviewed and stable Respiratory status: spontaneous breathing, nonlabored ventilation, respiratory function stable and patient connected to nasal cannula oxygen Cardiovascular status: blood pressure returned to baseline and stable Postop Assessment: no apparent nausea or vomiting Anesthetic complications: no   No notable events documented.  Last Vitals:  Vitals:   07/02/24 0514 07/02/24 0751  BP: (!) 204/93 (!) 171/69  Pulse: 89 72  Resp:    Temp:  36.7 C  SpO2:  100%    Last Pain:  Vitals:   07/02/24 0109  TempSrc:   PainSc: Asleep                 Oday Ridings S      "

## 2024-07-02 NOTE — Progress Notes (Addendum)
 Notified Rihner, DO of patient ripping off aquacel dressing to left hip, Patient able to remove mittens with teeth. Pain medication given at 1510. Denies pain. MD ordered okay'd to give Seroquel  early if need.Henrine to leave IV out. Justin Salinas

## 2024-07-02 NOTE — Discharge Instructions (Addendum)
 "   Justin Light, MD Lauraine Moores PA-C [facility] [address] [place], [place] [address] [phone] (tel)   [phone] (fax)   TOTAL HIP REPLACEMENT POSTOPERATIVE DIRECTIONS    Hip Rehabilitation, Guidelines Following Surgery   WEIGHT BEARING Weight bearing as tolerated with assist device (walker, cane, etc) as directed, use it as long as suggested by your surgeon or therapist, typically at least 4-6 weeks.  The results of a hip operation are greatly improved after range of motion and muscle strengthening exercises. Follow all safety measures which are given to protect your hip. If any of these exercises cause increased pain or swelling in your joint, decrease the amount until you are comfortable again. Then slowly increase the exercises. Call your caregiver if you have problems or questions.   HOME CARE INSTRUCTIONS  Most of the following instructions are designed to prevent the dislocation of your new hip.  Remove items at home which could result in a fall. This includes throw rugs or furniture in walking pathways.  Continue medications as instructed at time of discharge. You may have some home medications which will be placed on hold until you complete the course of blood thinner medication. You may start showering once you are discharged home. Do not remove your dressing. Do not put on socks or shoes without following the instructions of your caregivers.   Sit on chairs with arms. Use the chair arms to help push yourself up when arising.  Arrange for the use of a toilet seat elevator so you are not sitting low.  Walk with walker as instructed.  You may resume a sexual relationship in one month or when given the OK by your caregiver.  Use walker as long as suggested by your caregivers.  You may put full weight on your legs and walk as much as is comfortable. Avoid periods of inactivity such as sitting longer than an hour when not asleep. This helps  prevent blood clots.  You may return to work once you are cleared by designer, industrial/product.  Do not drive a car for 6 weeks or until released by your surgeon.  Do not drive while taking narcotics.  Wear elastic stockings for two weeks following surgery during the day but you may remove then at night.  Make sure you keep all of your appointments after your operation with all of your doctors and caregivers. You should call the office at the above phone number and make an appointment for approximately two weeks after the date of your surgery. Please pick up a stool softener and laxative for home use as long as you are requiring pain medications. ICE to the affected hip every three hours for 30 minutes at a time and then as needed for pain and swelling. Continue to use ice on the hip for pain and swelling from surgery. You may notice swelling that will progress down to the foot and ankle.  This is normal after surgery.  Elevate the leg when you are not up walking on it.   It is important for you to complete the blood thinner medication as prescribed by your doctor. Continue to use the breathing machine which will help keep your temperature down.  It is common for your temperature to cycle up and down following surgery, especially at night when you are not up moving around and exerting yourself.  The breathing machine keeps your lungs expanded and your temperature down.  RANGE OF MOTION AND STRENGTHENING EXERCISES  These  exercises are designed to help you keep full movement of your hip joint. Follow your caregiver's or physical therapist's instructions. Perform all exercises about fifteen times, three times per day or as directed. Exercise both hips, even if you have had only one joint replacement. These exercises can be done on a training (exercise) mat, on the floor, on a table or on a bed. Use whatever works the best and is most comfortable for you. Use music or television while you are exercising so that the  exercises are a pleasant break in your day. This will make your life better with the exercises acting as a break in routine you can look forward to.  Lying on your back, slowly slide your foot toward your buttocks, raising your knee up off the floor. Then slowly slide your foot back down until your leg is straight again.  Lying on your back spread your legs as far apart as you can without causing discomfort.  Lying on your side, raise your upper leg and foot straight up from the floor as far as is comfortable. Slowly lower the leg and repeat.  Lying on your back, tighten up the muscle in the front of your thigh (quadriceps muscles). You can do this by keeping your leg straight and trying to raise your heel off the floor. This helps strengthen the largest muscle supporting your knee.  Lying on your back, tighten up the muscles of your buttocks both with the legs straight and with the knee bent at a comfortable angle while keeping your heel on the floor.   SKILLED REHAB INSTRUCTIONS: If the patient is transferred to a skilled rehab facility following release from the hospital, a list of the current medications will be sent to the facility for the patient to continue.  When discharged from the skilled rehab facility, please have the facility set up the patient's Home Health Physical Therapy prior to being released. Also, the skilled facility will be responsible for providing the patient with their medications at time of release from the facility to include their pain medication and their blood thinner medication. If the patient is still at the rehab facility at time of the two week follow up appointment, the skilled rehab facility will also need to assist the patient in arranging follow up appointment in our office and any transportation needs.  POST-OPERATIVE OPIOID TAPER INSTRUCTIONS: It is important to wean off of your opioid medication as soon as possible. If you do not need pain medication after your  surgery it is ok to stop day one. Opioids include: Codeine, Hydrocodone (Norco, Vicodin), Oxycodone (Percocet, oxycontin ) and hydromorphone  amongst others.  Long term and even short term use of opiods can cause: Increased pain response Dependence Constipation Depression Respiratory depression And more.  Withdrawal symptoms can include Flu like symptoms Nausea, vomiting And more Techniques to manage these symptoms Hydrate well Eat regular healthy meals Stay active Use relaxation techniques(deep breathing, meditating, yoga) Do Not substitute Alcohol to help with tapering If you have been on opioids for less than two weeks and do not have pain than it is ok to stop all together.  Plan to wean off of opioids This plan should start within one week post op of your joint replacement. Maintain the same interval or time between taking each dose and first decrease the dose.  Cut the total daily intake of opioids by one tablet each day Next start to increase the time between doses. The last dose that should be eliminated is the  evening dose.    MAKE SURE YOU:  Understand these instructions.  Will watch your condition.  Will get help right away if you are not doing well or get worse.  Pick up stool softner and laxative for home use following surgery while on pain medications. Do not remove your dressing. The dressing is waterproof--it is OK to take showers. Continue to use ice for pain and swelling after surgery. Do not use any lotions or creams on the incision until instructed by your surgeon. Total Hip Protocol.  _____________________________________________________________  Thank you for allowing us  to be part of your care. You were hospitalized for left femur fracture and received surgery with the orthopedic surgeons.    See the changes in your medications and management of your chronic conditions below:   *For your Femur Fracture  - START                - Aspirin  325 mg until  08/02/2024               - pain control:                        - Tylenol  650 mg every 6 hours as needed for pain                       - Oxycodone  5 mg every 4 hours as needed for severe pain                        - robaxin  500 mg every 8 hours as needed for muscle cramps               - continue Vit D              - pick up stool softener and laxative for home use              - do not remove dressing              - dressing is waterproof so it is okay to take showers               - follow up with the orthopedic surgeon               - weight bear as tolerated with assist device (walker, cane, etc) as directed, use as long as suggested by surgeon or therapist (usually 4-6 weeks)              - continue work with Skilled Nursing Facility   *For your Dementia  -CONTINUE:             - Seroquel  25 mg nightly             - sertraline  100 mg daily              - melatonin nightly              - avoid haloperidol /risperidone /benzodiazepine medicines     *For your High Blood Pressure and coronary artery disease.  - CONTINUE             - amlodipine  10 mg              - coreg  12.5 mg BID             - spironolactone  25 mg daily              -  Clonidine  patch 0.1 mg/24 hours              - Crestor  20 mg daily   Please call your PCP or our clinic if you have any questions or concerns, we may be able to help and keep you from a long and expensive emergency room wait. Our clinic and after hours phone number is [phone]. The best time to call is Monday through Friday 9 am to 4 pm but there is always someone available 24/7 if you have an emergency. If you need medication refills please notify your pharmacy one week in advance and they will send us  a request.    We are glad you are feeling better,   Sallyanne Primas Internal Medicine Inpatient Teaching Service at [facility] "

## 2024-07-02 NOTE — Evaluation (Signed)
 Clinical/Bedside Swallow Evaluation Patient Details  Name: Justin Salinas MRN: [id number] Date of Birth: February 23, 1941  Today's Date: 07/02/2024 Time: SLP Start Time (ACUTE ONLY): 0910 SLP Stop Time (ACUTE ONLY): 9081 SLP Time Calculation (min) (ACUTE ONLY): 8 min  Past Medical History:  Past Medical History:  Diagnosis Date   Allergy    Anemia    Asthma    Atherosclerosis of abdominal aorta 02/26/2020   Seen on x-ray L-spine September 2021   BPH (benign prostatic hyperplasia)    CAD (coronary artery disease)    a. s/p MI 09/1993;  b. known CTO of RCA;  c. 06/2014 Cath: LM short/mod dzs, LAD 95ost, LCX nl, RI nl, RCA 100, EF 55%;  c. 06/2014 s/p CABG x 4: LIMA->LAD, VG->D1, VG->OM1, VG->Acute Marginal.   Carotid stenosis    a. Carotid US  1/17:  RICA 40-59%; LICA 60-79% // Carotid US  1/22: R 1-39; L 40-59; L VA aberrant flow; bilat subclavian stenosis // Carotid US  1/23: R 1-39, L 40-59; R subclavian stenosis   CKD (chronic kidney disease), stage III (HCC)    Constipation    in the past 2-3 weeks   Diabetes (HCC)    AODM   Diastolic dysfunction    a. 05/2015 Echo: EF 60-65%, LVH, Gr 1 DD, mild MR, mod dil RA/LA, mod TR, PASP .   Essential hypertension    GERD (gastroesophageal reflux disease)    History of kidney stones    Hyperlipidemia    Past Surgical History:  Past Surgical History:  Procedure Laterality Date   ANGIOPLASTY  09/22/1993   POBA of RCA (Dr. FABIENE Pinion)   BACK SURGERY  2000   back fusion - Dr. Victory Gunnels   CARDIAC CATHETERIZATION  11/28/1998   silent occlusion of RCA w/collaterals from left coronary system, prox LAD w/40-50% eccentric narrowing, 1st diagonal with 70-80% eccentric narrowing, 85% narrowing of prox small OM1 (Dr. FABIENE Pinion)   CAROTID DOPPLER  07/2011   left subclavian (50-69%); right bulb (0-49%); RICA (normal); left mid-distal CCA (0-49%); left bulb/prox ICA (50-69%); left vertebral with abnormal antegrade flow   CORONARY ARTERY BYPASS  GRAFT N/A 05/21/2014   Procedure: CORONARY ARTERY BYPASS GRAFTING (CABG) x  four, using left internal mammary artery and right leg greater saphenous vein harvested endoscopically;  Surgeon: Elspeth JAYSON Millers, MD;  Location: [facility] OR;  Service: Open Heart Surgery;  Laterality: N/A;   INTRAOPERATIVE TRANSESOPHAGEAL ECHOCARDIOGRAM N/A 05/21/2014   Procedure: INTRAOPERATIVE TRANSESOPHAGEAL ECHOCARDIOGRAM;  Surgeon: Elspeth JAYSON Millers, MD;  Location: [facility] OR;  Service: Open Heart Surgery;  Laterality: N/A;   LEFT HEART CATHETERIZATION WITH CORONARY ANGIOGRAM N/A 05/19/2014   Procedure: LEFT HEART CATHETERIZATION WITH CORONARY ANGIOGRAM;  Surgeon: Ozell BIRCH Fell, MD;  Location: [facility] CATH LAB;  Service: Cardiovascular;  Laterality: N/A;   LUMBAR LAMINECTOMY/DECOMPRESSION MICRODISCECTOMY Left 09/23/2020   Procedure: Laminectomy and Foraminotomy - left - Lumbar three-Lumbar four;  Surgeon: Gunnels Victory, MD;  Location: [facility] OR;  Service: Neurosurgery;  Laterality: Left;   NASAL SINUS SURGERY  2010   NM MYOCAR PERF WALL MOTION  09/19/2009   bruce myoview - mild perfusion defect in basal inferior region (infarct/scar), EF 60%, low risk scan   RENAL DOPPLER  10/2011   SMA w/ 70-99% diameter reduction & high grade stenosis; R&L renals w/narrowing and increased velocities (60-99%), R kidney smaller than L   TRANSTHORACIC ECHOCARDIOGRAM  10/20/2012   EF 55-60%, moderate concentric hypertrophy, ventricular septum thickness increased, calcified MV annulus   VASECTOMY  1963  HPI:  Justin Salinas is an 84 year old male who presented 1/27 with altered mental status and recent falls with decreased left leg movement from [facility] and admitted for L femur fracture now s/p surgical intervention. CXR 1/27: No focal pulmonary opacity. No pleural effusion. No pneumothorax. Pt known to this service from prior admission 1/14-20 with puree diet initiated following clinical swallow evaluation with advancement to regular  texture prior to d/c. Pt with a past medical history of dementia, CAD s/p CABG, hypertension, type 2 diabetes, CKD stage III, HFpEF, and recurrent falls.    Assessment / Plan / Recommendation  Clinical Impression  Pt presents with a mild oral dypshagia.  He is known to this therapist from prior admission 1/14-20 with clinical management of oral dypshagia at that time.  SLP provided oral care prior to administration of PO trials.  Xerostomia noted, small amout of debris removed with swab.  Pt tolerated all consistencies trialed without any clinical s/s of aspiration, including serial straw sips of thin liquid.  There was prolonged oral phase with solids. Soft solid texture resulted in somewhat more efficient oral phase.  There was oral residue with solid and soft solids textures, which was minimally improved with liquid wash.  Recommend intermittent supervision with meals given confusion and regular, thorough oral care, especially after POs.  SLP will follow for diet tolerance and possible advancement.    Recommend mechanical soft solids with thin liquids. Recommend oral care following meals.   SLP Visit Diagnosis: Dysphagia, oral phase (R13.11)    Aspiration Risk  Mild aspiration risk    Diet Recommendation Dysphagia 3 (Mech soft);Thin liquid    Liquid Administration via: Cup;Straw Medication Administration:  (As tolerated, no specific precautions; consider crushing if mentation is a barrier to medication administration) Supervision: Intermittent supervision to cue for compensatory strategies Compensations: Slow rate;Small sips/bites;Follow solids with liquid Postural Changes: Seated upright at 90 degrees    Other Recommendations Oral Care Recommendations: Oral care BID;Oral care before and after PO;Staff/trained caregiver to provide oral care      Functional Status Assessment Patient has had a recent decline in their functional status and demonstrates the ability to make significant  improvements in function in a reasonable and predictable amount of time.  Frequency and Duration min 2x/week  2 weeks       Prognosis Prognosis for improved oropharyngeal function: Good Barriers to Reach Goals: Cognitive deficits      Swallow Study   General HPI: Brecken Dewoody Sistrunk is an 84 year old male who presented 1/27 with altered mental status and recent falls with decreased left leg movement from [facility] and admitted for L femur fracture now s/p surgical intervention. CXR 1/27: No focal pulmonary opacity. No pleural effusion. No pneumothorax. Pt known to this service from prior admission 1/14-20 with puree diet initiated following clinical swallow evaluation with advancement to regular texture prior to d/c. Pt with a past medical history of dementia, CAD s/p CABG, hypertension, type 2 diabetes, CKD stage III, HFpEF, and recurrent falls. Type of Study: Bedside Swallow Evaluation Previous Swallow Assessment: clinical management during admission 1/14-20 Diet Prior to this Study: NPO Temperature Spikes Noted: No History of Recent Intubation: Yes (for procedure only, surgery 1/28) Behavior/Cognition: Alert;Cooperative;Confused;Requires cueing Oral Cavity Assessment: Dry (debris in oral cavity) Oral Care Completed by SLP: Yes Oral Cavity - Dentition: Adequate natural dentition;Missing dentition Vision: Functional for self-feeding Self-Feeding Abilities: Needs assist Patient Positioning: Upright in chair Baseline Vocal Quality: Normal Volitional Cough: Cognitively unable to elicit Volitional  Swallow: Unable to elicit    Oral/Motor/Sensory Function Overall Oral Motor/Sensory Function:  (unable to assess)   Ice Chips Ice chips: Not tested   Thin Liquid Thin Liquid: Within functional limits Presentation: Straw    Nectar Thick Nectar Thick Liquid: Not tested   Honey Thick Honey Thick Liquid: Not tested   Puree Puree: Impaired Presentation: Spoon Oral Phase Functional  Implications: Oral residue   Solid     Solid: Within functional limits Presentation: Self Fed;Spoon Oral Phase Functional Implications: Oral residue      Anette FORBES Grippe, MA, CCC-SLP Acute Rehabilitation Services Office: [phone] 07/02/2024,9:40 AM

## 2024-07-02 NOTE — [place] FL2 (Signed)
 " [place]  MEDICAID FL2 LEVEL OF CARE FORM     IDENTIFICATION  Patient Name: Justin Salinas Birthdate: 12-01-1940 Sex: male Admission Date (Current Location): 06/30/2024  [facility] and [place] Number:  Producer, Television/film/video and Address:  [place], [address], [place], [place] [address]      Provider Number: [id number]  Attending Physician Name and Address:  Eben Reyes BROCKS, MD  Relative Name and Phone Number:       Current Level of Care:   Recommended Level of Care: Skilled Nursing Facility Prior Approval Number:    Date Approved/Denied:   PASRR Number: [id number]  Discharge Plan: SNF    Current Diagnoses: Patient Active Problem List   Diagnosis Date Noted   Femur fracture (HCC) 06/30/2024   Fall 06/30/2024   SDH (subdural hematoma) (HCC) 06/18/2024   Acute encephalopathy 06/17/2024   Encephalopathy 06/13/2024   Nystagmus 06/13/2024   UTI (urinary tract infection) 06/13/2024   Acute kidney injury superimposed on stage 4 chronic kidney disease (HCC) 11/23/2023   Chronic diastolic CHF (congestive heart failure) (HCC) 11/23/2023   Macrocytic anemia 11/23/2023   Dementia with behavioral disturbance (HCC) 11/23/2023   AKI (acute kidney injury) 09/25/2023   Hyperglycemia 07/24/2022   Recurrent falls 07/23/2022   Cognitive decline 07/23/2022   Lumbar radiculopathy 09/23/2020   Atherosclerosis of abdominal aorta 02/26/2020   Status post placement of bone anchored hearing aid (BAHA) 05/09/2019   Asymmetric SNHL (sensorineural hearing loss) 04/02/2019   Iron deficiency anemia 10/22/2017   Type 2 diabetes mellitus with hyperglycemia (HCC) 01/03/2017   PVD (posterior vitreous detachment), left eye 02/16/2016   Vitreous syneresis of right eye 02/16/2016   Diastolic dysfunction    Hypertension    CKD stage 3 due to type 2 diabetes mellitus (HCC)    S/P CABG x 4 05/21/2014   Bilateral dry eyes 01/20/2014   Nuclear cataract, bilateral  12/10/2013   Astigmatism 01/11/2012   Hypermetropia 01/11/2012   Presbyopia 01/11/2012   Essential hypertension 12/01/2007   Carotid artery disease 12/01/2007   BPH (benign prostatic hyperplasia) 12/01/2007   Controlled type 2 diabetes mellitus, with long-term current use of insulin  (HCC) 11/28/2007   CAD (coronary artery disease) 11/28/2007   GERD 11/28/2007    Orientation RESPIRATION BLADDER Height & Weight     Self  Normal Incontinent Weight: 164 lb 10.9 oz (74.7 kg) Height:  5' 9 (175.3 cm)  BEHAVIORAL SYMPTOMS/MOOD NEUROLOGICAL BOWEL NUTRITION STATUS      Incontinent Diet (see d/c summary)  AMBULATORY STATUS COMMUNICATION OF NEEDS Skin   Extensive Assist Verbally Surgical wounds (incision right hip, righ elbow, and left hip)                       Personal Care Assistance Level of Assistance  Bathing, Feeding, Dressing Bathing Assistance: Maximum assistance Feeding assistance: Limited assistance Dressing Assistance: Maximum assistance     Functional Limitations Info  Sight, Hearing, Speech Sight Info: Impaired Hearing Info: Impaired Speech Info: Impaired    SPECIAL CARE FACTORS FREQUENCY  OT (By licensed OT), PT (By licensed PT)     PT Frequency: 5x/week OT Frequency: 5x/week            Contractures Contractures Info: Not present    Additional Factors Info  Code Status, Allergies Code Status Info: DNR Allergies Info: Exenatide, Linagliptin , Lasix  (Furosemide )   Insulin  Sliding Scale Info: see dc summary       Current Medications (  07/02/2024):  This is the current hospital active medication list Current Facility-Administered Medications  Medication Dose Route Frequency Provider Last Rate Last Admin   0.9 %  sodium chloride  infusion   Intravenous Continuous Danton Lauraine LABOR, PA-C 40 mL/hr at 07/02/24 0503 New Bag at 07/02/24 0503   acetaminophen  (TYLENOL ) tablet 1,000 mg  1,000 mg Oral Q8H Danton Lauraine LABOR, PA-C   1,000 mg at 06/30/24 2108    acetaminophen  (TYLENOL ) tablet 325-650 mg  325-650 mg Oral Q6H PRN Danton Lauraine LABOR, PA-C       amLODipine  (NORVASC ) tablet 10 mg  10 mg Oral Daily Danton Lauraine LABOR, PA-C       antiseptic oral rinse (BIOTENE) solution 15 mL  15 mL Mouth Rinse QID PRN Danton Lauraine LABOR, PA-C       ascorbic acid  (VITAMIN C ) tablet 1,000 mg  1,000 mg Oral q AM Danton Lauraine LABOR, PA-C       aspirin  EC tablet 325 mg  325 mg Oral Q breakfast Danton Lauraine LABOR, PA-C       carvedilol  (COREG ) tablet 12.5 mg  12.5 mg Oral BID Danton Lauraine LABOR, PA-C   12.5 mg at 06/30/24 2108   cholecalciferol  (VITAMIN D3) 25 MCG (1000 UNIT) tablet 2,000 Units  2,000 Units Oral Daily Danton Lauraine LABOR, PA-C       diphenhydrAMINE  (BENADRYL ) 12.5 MG/5ML elixir 12.5-25 mg  12.5-25 mg Oral Q4H PRN Danton Lauraine LABOR, PA-C       docusate sodium  (COLACE) capsule 100 mg  100 mg Oral BID McClung, Sarah A, PA-C       finasteride  (PROSCAR ) tablet 5 mg  5 mg Oral Daily McClung, Sarah A, PA-C       fluticasone  (FLONASE ) 50 MCG/ACT nasal spray 1 spray  1 spray Each Nare Daily Danton Lauraine LABOR, PA-C       hydrALAZINE  (APRESOLINE ) tablet 10 mg  10 mg Oral Q8H PRN Rihner, Emilie, DO       Or   hydrALAZINE  (APRESOLINE ) injection 2 mg  2 mg Intravenous Q8H PRN Rihner, Emilie, DO   2 mg at 07/02/24 0516   insulin  aspart (novoLOG ) injection 0-9 Units  0-9 Units Subcutaneous TID WC Danton Lauraine LABOR, PA-C   5 Units at 07/02/24 9152   insulin  glargine-yfgn injection 10 Units  10 Units Subcutaneous QHS Danton Lauraine LABOR, PA-C   10 Units at 07/01/24 2217   lidocaine  (LIDODERM ) 5 % 1 patch  1 patch Transdermal Q24H Danton Lauraine LABOR, PA-C   1 patch at 06/30/24 1446   loratadine  (CLARITIN ) tablet 10 mg  10 mg Oral q AM McClung, Sarah A, PA-C       melatonin tablet 3 mg  3 mg Oral QHS Danton Lauraine LABOR, PA-C   3 mg at 06/30/24 2107   menthol  (CEPACOL) lozenge 3 mg  1 lozenge Oral PRN Danton Lauraine LABOR, PA-C       Or   phenol (CHLORASEPTIC) mouth spray 1 spray  1 spray  Mouth/Throat PRN Danton Lauraine LABOR, PA-C       methocarbamol  (ROBAXIN ) tablet 500 mg  500 mg Oral Q8H PRN Danton, Sarah A, PA-C       Or   methocarbamol  (ROBAXIN ) injection 500 mg  500 mg Intravenous Q8H PRN Danton Lauraine LABOR, PA-C       metoCLOPramide  (REGLAN ) tablet 5-10 mg  5-10 mg Oral Q8H PRN McClung, Sarah A, PA-C       Or   metoCLOPramide  (REGLAN ) injection 5-10 mg  5-10 mg Intravenous Q8H PRN Danton Lauraine LABOR, PA-C       morphine  (PF) 2 MG/ML injection 0.5 mg  0.5 mg Intravenous Q2H PRN Danton Lauraine LABOR, PA-C   0.5 mg at 07/02/24 9960   mupirocin  ointment (BACTROBAN ) 2 % 1 Application  1 Application Nasal BID Danton Lauraine LABOR, PA-C   1 Application at 07/01/24 2217   ondansetron  (ZOFRAN ) tablet 4 mg  4 mg Oral Q6H PRN Danton Lauraine LABOR, PA-C       Or   ondansetron  (ZOFRAN ) injection 4 mg  4 mg Intravenous Q6H PRN Danton Lauraine LABOR, PA-C       oxyCODONE  (Oxy IR/ROXICODONE ) immediate release tablet 2.5-5 mg  2.5-5 mg Oral Q4H PRN Danton, Sarah A, PA-C       polyethylene glycol (MIRALAX  / GLYCOLAX ) packet 17 g  17 g Oral Daily Danton Lauraine LABOR, PA-C   17 g at 06/30/24 1446   QUEtiapine  (SEROQUEL ) tablet 25 mg  25 mg Oral QHS Danton Lauraine LABOR, PA-C   25 mg at 06/30/24 2108   repaglinide  (PRANDIN ) tablet 0.5 mg  0.5 mg Oral TID AC McClung, Sarah A, PA-C   0.5 mg at 06/30/24 1745   rosuvastatin  (CRESTOR ) tablet 20 mg  20 mg Oral Daily Danton Lauraine LABOR, PA-C       senna (SENOKOT) tablet 8.6 mg  1 tablet Oral BID Danton Lauraine LABOR, PA-C       sertraline  (ZOLOFT ) tablet 100 mg  100 mg Oral q AM McClung, Sarah A, PA-C       spironolactone  (ALDACTONE ) tablet 25 mg  25 mg Oral q AM McClung, Sarah A, PA-C       tamsulosin  (FLOMAX ) capsule 0.4 mg  0.4 mg Oral QPC supper Danton Lauraine LABOR, PA-C   0.4 mg at 06/30/24 2107     Discharge Medications: Please see discharge summary for a list of discharge medications.  Relevant Imaging Results:  Relevant Lab Results:   Additional Information SS#:  [id number]  Luann SHAUNNA Cumming, LCSW     "

## 2024-07-02 NOTE — Progress Notes (Signed)
 Orthopaedic Trauma Progress Note  SUBJECTIVE: Doing okay this morning.  Notes some discomfort in the left hip as expected given his injury and surgery.  Was able to mobilize to bedside chair with therapies earlier.  Just finished speech evaluation.  No chest pain. No SOB. No nausea/vomiting. No other complaints.  No family at bedside currently  OBJECTIVE:  Vitals:   07/02/24 0514 07/02/24 0751  BP: (!) 204/93 (!) 171/69  Pulse: 89 72  Resp:    Temp:  98 F (36.7 C)  SpO2:  100%    Opiates Today (MME): Today's  total administered Morphine  Milligram Equivalents: 1.5 Opiates Yesterday (MME): Yesterday's total administered Morphine  Milligram Equivalents: 22.5  General: Sitting up in bedside chair, no acute distress Respiratory: No increased work of breathing.  Operative Extremity (left lower extremity): Aquacel dressing to the anterior hip is clean, dry, intact.  Notes some soreness with palpation over the lateral hip and proximal thigh as expected.  Less tender through the distal thigh, lower leg, ankle, foot.  Ankle DF/PF intact.  Endorses sensation light touch over all aspects of the foot. + DP pulse  IMAGING: Stable post op imaging left hip  LABS:  Results for orders placed or performed during the hospital encounter of 06/30/24 (from the past 24 hours)  Glucose, capillary     Status: Abnormal   Collection Time: 07/01/24 10:12 AM  Result Value Ref Range   Glucose-Capillary 249 (H) 70 - 99 mg/dL  Glucose, capillary     Status: Abnormal   Collection Time: 07/01/24 11:20 AM  Result Value Ref Range   Glucose-Capillary 240 (H) 70 - 99 mg/dL  Glucose, capillary     Status: Abnormal   Collection Time: 07/01/24 12:08 PM  Result Value Ref Range   Glucose-Capillary 224 (H) 70 - 99 mg/dL  Glucose, capillary     Status: Abnormal   Collection Time: 07/01/24  3:36 PM  Result Value Ref Range   Glucose-Capillary 219 (H) 70 - 99 mg/dL  VITAMIN D  25 Hydroxy (Vit-D Deficiency, Fractures)      Status: Abnormal   Collection Time: 07/01/24  3:59 PM  Result Value Ref Range   Vit D, 25-Hydroxy 27.1 (L) 30 - 100 ng/mL  Lactic acid, plasma     Status: None   Collection Time: 07/01/24  3:59 PM  Result Value Ref Range   Lactic Acid, Venous 1.1 0.5 - 1.9 mmol/L  Glucose, capillary     Status: Abnormal   Collection Time: 07/01/24  6:15 PM  Result Value Ref Range   Glucose-Capillary 258 (H) 70 - 99 mg/dL  Glucose, capillary     Status: Abnormal   Collection Time: 07/01/24  8:15 PM  Result Value Ref Range   Glucose-Capillary 244 (H) 70 - 99 mg/dL  Basic metabolic panel     Status: Abnormal   Collection Time: 07/02/24  5:09 AM  Result Value Ref Range   Sodium 142 135 - 145 mmol/L   Potassium 5.0 3.5 - 5.1 mmol/L   Chloride 108 98 - 111 mmol/L   CO2 22 22 - 32 mmol/L   Glucose, Bld 280 (H) 70 - 99 mg/dL   BUN 38 (H) 8 - 23 mg/dL   Creatinine, Ser 8.44 (H) 0.61 - 1.24 mg/dL   Calcium  9.1 8.9 - 10.3 mg/dL   GFR, Estimated 44 (L) >60 mL/min   Anion gap 12 5 - 15  CBC     Status: Abnormal   Collection Time: 07/02/24  5:09 AM  Result Value Ref Range   WBC 11.4 (H) 4.0 - 10.5 K/uL   RBC 3.65 (L) 4.22 - 5.81 MIL/uL   Hemoglobin 11.6 (L) 13.0 - 17.0 g/dL   HCT 63.6 (L) 60.9 - 47.9 %   MCV 99.5 80.0 - 100.0 fL   MCH 31.8 26.0 - 34.0 pg   MCHC 32.0 30.0 - 36.0 g/dL   RDW 87.2 88.4 - 84.4 %   Platelets 217 150 - 400 K/uL   nRBC 0.0 0.0 - 0.2 %  Glucose, capillary     Status: Abnormal   Collection Time: 07/02/24  7:51 AM  Result Value Ref Range   Glucose-Capillary 287 (H) 70 - 99 mg/dL    ASSESSMENT: Justin Salinas Wire is a 84 y.o. male, 1 Day Post-Op s/p fall at SNF  Procedures: LEFT HIP HEMIARTHROPLASTY  CV/Blood loss: Acute blood loss anemia, Hgb 11.6 this AM. Hemodynamically stable  PLAN: Weightbearing: WBAT LLE ROM: Unrestricted ROM.  No hip precautions needed Incisional and dressing care: Dressings left intact until follow-up  Showering: Okay to shower.  Aquacel  dressing may get wet Orthopedic device(s): None  Pain management: Continue current regimen.  Limit narcotics as able VTE prophylaxis: Aspirin , SCDs ID:  Ancef  2gm post op Foley/Lines:  No foley, KVO IVFs Bone health: Vitamin D  level 27, will start on supplementation Dispo: PT/OT evaluation ongoing.  Ortho issues stable.  Okay for discharge back to SNF from ortho standpoint once cleared by medicine team and therapies  D/C recommendations: - Oxycodone , Tylenol , Robaxin  for pain control - Aspirin  325 mg daily x 30 days for DVT prophylaxis - Continue 2000 units Vit D supplementation daily  Follow - up plan: 2 weeks after d/c for wound check and repeat x-rays   Contact information:  Franky Light MD, Lauraine Moores PA-C. After hours and holidays please check [email or web address] for group call information for [facility]   Lauraine PATRIC Moores, PA-C [phone] (office) [email or web address]

## 2024-07-02 NOTE — Plan of Care (Signed)
" °  Problem: Coping: Goal: Ability to adjust to condition or change in health will improve Outcome: Progressing   Problem: Fluid Volume: Goal: Ability to maintain a balanced intake and output will improve Outcome: Progressing   Problem: Nutritional: Goal: Maintenance of adequate nutrition will improve Outcome: Progressing   Problem: Skin Integrity: Goal: Risk for impaired skin integrity will decrease Outcome: Progressing   Problem: Clinical Measurements: Goal: Will remain free from infection Outcome: Progressing   Problem: Activity: Goal: Risk for activity intolerance will decrease Outcome: Progressing   Problem: Nutrition: Goal: Adequate nutrition will be maintained Outcome: Progressing   Problem: Pain Managment: Goal: General experience of comfort will improve and/or be controlled Outcome: Progressing   Problem: Safety: Goal: Ability to remain free from injury will improve Outcome: Progressing   Problem: Skin Integrity: Goal: Risk for impaired skin integrity will decrease Outcome: Progressing   Problem: Education: Goal: Ability to describe self-care measures that may prevent or decrease complications (Diabetes Survival Skills Education) will improve Outcome: Not Progressing Goal: Individualized Educational Video(s) Outcome: Not Progressing   Problem: Health Behavior/Discharge Planning: Goal: Ability to identify and utilize available resources and services will improve Outcome: Not Progressing Goal: Ability to manage health-related needs will improve Outcome: Not Progressing   Problem: Health Behavior/Discharge Planning: Goal: Ability to manage health-related needs will improve Outcome: Not Progressing   "

## 2024-07-02 NOTE — Progress Notes (Signed)
 "  HD#2 SUBJECTIVE:  Patient Summary: Justin Salinas is an 84 year old male with a past medical history of dementia, CAD s/p CABG, hypertension, type 2 diabetes, CKD stage III, HFpEF, and recurrent falls who presents with altered mental status and recent falls with decreased left leg movement from [facility] and admitted for L femur fracture and surgical intervention.   Overnight Events: with NPO status given concern for possible aspiration risk, home meds held. Received Zyprexa  2.5 mg .   Interim History: Patient sitting in chair during assessment. 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 No other complaints at this time.   OBJECTIVE:  Vital Signs: Vitals:   07/02/24 0031 07/02/24 0423 07/02/24 0514 07/02/24 0751  BP: (!) 164/73 (!) 144/112 (!) 204/93 (!) 171/69  Pulse: 79 69 89 72  Resp: 19 20    Temp: (!) 97.5 F (36.4 C) 98.4 F (36.9 C)  98 F (36.7 C)  TempSrc:      SpO2: 98% 98%  100%  Weight:  74.7 kg    Height:       Supplemental O2: Room Air SpO2: 100 % O2 Flow Rate (L/min): 2 L/min  Filed Weights   06/30/24 1037 07/01/24 0553 07/02/24 0423  Weight: 75.3 kg 75 kg 74.7 kg     Intake/Output Summary (Last 24 hours) at 07/02/2024 1022 Last data filed at 07/02/2024 0425 Gross per 24 hour  Intake 100 ml  Output 600 ml  Net -500 ml   Net IO Since Admission: -850 mL [07/02/24 1022]  Physical Exam: Physical Exam Constitutional:      General: He is not in acute distress.    Appearance: He is not ill-appearing, toxic-appearing or diaphoretic.  Pulmonary:     Effort: Pulmonary effort is normal.  Abdominal:     Palpations: Abdomen is soft.  Musculoskeletal:     Comments: Able to wiggle toes on command bilaterally.   Skin:    General: Skin is warm and dry.  Neurological:     Comments: Awake, alert, sitting in chair. Speaking in clear complete sentences.  Oriented to self and situation.      Patient Lines/Drains/Airways Status     Active Line/Drains/Airways     Name Placement date Placement time Site Days   Peripheral IV 07/01/24 20 G Right Antecubital 07/01/24  0639  Antecubital  1   Wound 07/01/24 0924 Surgical Closed Surgical Incision Hip Right 07/01/24  0924  Hip  1   Wound 07/01/24 0924 Surgical Closed Surgical Incision Elbow Right 07/01/24  0924  Elbow  1   Wound 07/01/24 1048 Surgical Closed Surgical Incision Hip Left 07/01/24  1048  Hip  1            Pertinent labs and imaging:      Latest Ref Rng & Units 07/02/2024    5:09 AM 07/01/2024    5:40 AM 06/30/2024   10:54 AM  CBC  WBC 4.0 - 10.5 K/uL 11.4  11.6    Hemoglobin 13.0 - 17.0 g/dL 88.3  87.8  87.3   Hematocrit 39.0 - 52.0 % 36.3  36.5  37.0   Platelets 150 - 400 K/uL 217  199         Latest Ref Rng & Units 07/02/2024    5:09 AM 07/01/2024    5:40 AM 06/30/2024    2:47 PM  CMP  Glucose 70 - 99 mg/dL 719  729  612   BUN 8 - 23 mg/dL 38  40  43   Creatinine 0.61 - 1.24 mg/dL 8.44  8.52  8.40   Sodium 135 - 145 mmol/L 142  141  140   Potassium 3.5 - 5.1 mmol/L 5.0  4.6  5.2   Chloride 98 - 111 mmol/L 108  109  106   CO2 22 - 32 mmol/L 22  22  22    Calcium  8.9 - 10.3 mg/dL 9.1  9.4  9.4     DG HIP UNILAT W OR W/O PELVIS 2-3 VIEWS LEFT Result Date: 07/01/2024 CLINICAL DATA:  Status post hip hemiarthroplasty. EXAM: DG HIP (WITH OR WITHOUT PELVIS) 2-3V LEFT COMPARISON:  None Available. FINDINGS: There is a left hip arthroplasty. The arthroplasty components appear intact and in anatomic alignment. No acute fracture or dislocation. Moderate arthritic changes of the right hip. Lower lumbar posterior fusion. Vascular calcification noted IMPRESSION: Left hip arthroplasty. No acute fracture or dislocation. Electronically Signed   By: Vanetta Chou M.D.   On: 07/01/2024 18:59   DG HIP UNILAT WITH PELVIS 2-3 VIEWS LEFT Result Date: 07/01/2024 CLINICAL DATA:  Clinically  surgery. EXAM: DG HIP (WITH OR WITHOUT PELVIS) 2-3V LEFT COMPARISON:  Radiograph dated 06/30/2024. FINDINGS: Intraoperative fluoroscopic spot images of the left hip. The total fluoroscopic time is 8 seconds with a cumulative air kerma 0.81 mGy. There is a left hip arthroplasty. IMPRESSION: Intraoperative fluoroscopic spot images of the left hip. Electronically Signed   By: Vanetta Chou M.D.   On: 07/01/2024 18:47   DG C-Arm 1-60 Min-No Report Result Date: 07/01/2024 Fluoroscopy was utilized by the requesting physician.  No radiographic interpretation.   DG C-Arm 1-60 Min-No Report Result Date: 07/01/2024 Fluoroscopy was utilized by the requesting physician.  No radiographic interpretation.    ASSESSMENT/PLAN:  Assessment: Principal Problem:   Femur fracture (HCC) Active Problems:   Controlled type 2 diabetes mellitus, with long-term current use of insulin  (HCC)   Essential hypertension   S/P CABG x 4   CKD stage 3 due to type 2 diabetes mellitus (HCC)   AKI (acute kidney injury)   Dementia with behavioral disturbance (HCC)   Acute encephalopathy   SDH (subdural hematoma) (HCC)   Fall   Plan: Impacted left femoral neck fracture Left subcapital hip fracture with mild displacement Fall Surgery 07/01/2024. Received morphine  0.5 mg at 0039. Vit D 27.1. PT stated would benefit ffrom acute skilled PT to increase independence and safety.  - Appreciate orthopedic recs.  - weightbearing as tolerated to the left lower extremity.   - postoperative Ancef  for 3 doses (complete) - Aspirin  for DVT prophylaxis (325 mg) - mobilize with physical and Occupational Therapy.   - There is no hip precautions. - Tylenol  1000 mg q 8 hours  - morphine  2 mg q 2 hours PRN  - Oxycodone  2.5-5 mg q 4 hours PRN severe pain - Lidocaine  patch  - continue cholecalciferol  25 mcg daily   Difficulty swallowing  Appreciate SLP eval. Noted: xerostomia noted with small about of debris removed with swab. Prolonged  oral phase with solids. Soft solid texture resulted in more efficient oral phase. Oral residue with solid and soft solid textures minimally improved with liquid wash.  - intermittent supervision with meals with mechanical soft solids with thin liquids and oral care following meals. - resume meds   Acute encephalopathy with underlying dementia Recent subdural hematoma, resolved on CT 06/30/2024 - Delirium  precautions - seroquel  25 mg nightly - sertraline  100 mg daily  - melatonin 3 mg nightly   AKI on CKD stage III - improved  Cr on last admission 1.41, Cr this admission 1.67; likely due to poor oral intake after recurrent falls and painful injury with hip fracture. Received IV fluids in ED. Cr 1.55 - continue to monitor   HTN CAD s/p CABG - amlodipine  10 mg daily  - Coreg  12.5 mg BID  - spironolactone  25 mg daily  - hydralazine  10 mg oral or 2 mg injection PRN for SBP >180 - Rosuvastatin  20 mg daily   Type 2 diabetes Hemoglobin A1c 6.9 on 06/13/2024.  He continues on basal bolus insulin . - 10 units insulin  glargine nightly - prandin  0.5 mg TID  - sensitive sliding scale  Constipation  - Senna 8.6 mg BID - Miralax  17 g oral daily  - docustate sodium 100 mg BID  Urinary Hesitancy  - finasteride  5 mg daily  - tamsulosin  0.4 mg daily   Nutritional Products - Vit C and Vit D3  Respiratory Agents  - flonase   - claritin     Best Practice: Diet: Dysphagia 3  IVF: Fluids: none, Rate: None VTE: SCDs Start: 07/01/24 1206 Place TED hose Start: 07/01/24 1206 Code: DNR limited   Disposition planning: Therapy Recs: SNF, DME: other pending eval after surgery  Family Contact: daughter, to be notified. DISPO: Anticipated discharge pending clinical improvement  Signature:  Mendel Binsfeld [facility]  10:22 AM, 07/02/2024  On Call pager [phone]  "

## 2024-07-03 DIAGNOSIS — S72042A Displaced fracture of base of neck of left femur, initial encounter for closed fracture: Secondary | ICD-10-CM | POA: Diagnosis not present

## 2024-07-03 DIAGNOSIS — E1122 Type 2 diabetes mellitus with diabetic chronic kidney disease: Secondary | ICD-10-CM | POA: Diagnosis not present

## 2024-07-03 DIAGNOSIS — K5900 Constipation, unspecified: Secondary | ICD-10-CM | POA: Diagnosis not present

## 2024-07-03 DIAGNOSIS — I6200 Nontraumatic subdural hemorrhage, unspecified: Secondary | ICD-10-CM

## 2024-07-03 DIAGNOSIS — G9340 Encephalopathy, unspecified: Secondary | ICD-10-CM | POA: Diagnosis not present

## 2024-07-03 DIAGNOSIS — N183 Chronic kidney disease, stage 3 unspecified: Secondary | ICD-10-CM | POA: Diagnosis not present

## 2024-07-03 DIAGNOSIS — I2510 Atherosclerotic heart disease of native coronary artery without angina pectoris: Secondary | ICD-10-CM | POA: Diagnosis not present

## 2024-07-03 DIAGNOSIS — R1310 Dysphagia, unspecified: Secondary | ICD-10-CM | POA: Diagnosis not present

## 2024-07-03 DIAGNOSIS — N179 Acute kidney failure, unspecified: Secondary | ICD-10-CM | POA: Diagnosis not present

## 2024-07-03 DIAGNOSIS — I129 Hypertensive chronic kidney disease with stage 1 through stage 4 chronic kidney disease, or unspecified chronic kidney disease: Secondary | ICD-10-CM | POA: Diagnosis not present

## 2024-07-03 DIAGNOSIS — W19XXXA Unspecified fall, initial encounter: Secondary | ICD-10-CM | POA: Diagnosis not present

## 2024-07-03 DIAGNOSIS — F0390 Unspecified dementia without behavioral disturbance: Secondary | ICD-10-CM | POA: Diagnosis not present

## 2024-07-03 LAB — CBC
HCT: 32.4 % — ABNORMAL LOW (ref 39.0–52.0)
Hemoglobin: 10.6 g/dL — ABNORMAL LOW (ref 13.0–17.0)
MCH: 31.6 pg (ref 26.0–34.0)
MCHC: 32.7 g/dL (ref 30.0–36.0)
MCV: 96.7 fL (ref 80.0–100.0)
Platelets: 184 10*3/uL (ref 150–400)
RBC: 3.35 MIL/uL — ABNORMAL LOW (ref 4.22–5.81)
RDW: 12.5 % (ref 11.5–15.5)
WBC: 8.7 10*3/uL (ref 4.0–10.5)
nRBC: 0 % (ref 0.0–0.2)

## 2024-07-03 LAB — GLUCOSE, CAPILLARY
Glucose-Capillary: 155 mg/dL — ABNORMAL HIGH (ref 70–99)
Glucose-Capillary: 299 mg/dL — ABNORMAL HIGH (ref 70–99)
Glucose-Capillary: 294 mg/dL — ABNORMAL HIGH (ref 70–99)

## 2024-07-03 LAB — BASIC METABOLIC PANEL WITH GFR
Anion gap: 9 (ref 5–15)
BUN: 33 mg/dL — ABNORMAL HIGH (ref 8–23)
CO2: 23 mmol/L (ref 22–32)
Calcium: 8.9 mg/dL (ref 8.9–10.3)
Chloride: 107 mmol/L (ref 98–111)
Creatinine, Ser: 1.36 mg/dL — ABNORMAL HIGH (ref 0.61–1.24)
GFR, Estimated: 51 mL/min — ABNORMAL LOW
Glucose, Bld: 166 mg/dL — ABNORMAL HIGH (ref 70–99)
Potassium: 4.5 mmol/L (ref 3.5–5.1)
Sodium: 139 mmol/L (ref 135–145)

## 2024-07-03 MED ORDER — LIDOCAINE 5 % EX PTCH: 1.0000 | MEDICATED_PATCH | CUTANEOUS | Status: AC

## 2024-07-03 MED ORDER — INSULIN GLARGINE-YFGN 100 UNIT/ML ~~LOC~~ SOLN
15.0000 [IU] | Freq: Every day | SUBCUTANEOUS | Status: DC
  Filled 2024-07-03: qty 0.15

## 2024-07-03 MED ORDER — LANTUS SOLOSTAR 100 UNIT/ML ~~LOC~~ SOPN: 15.0000 [IU] | PEN_INJECTOR | Freq: Every day | SUBCUTANEOUS | Status: AC

## 2024-07-03 MED ORDER — BISACODYL 10 MG RE SUPP
10.0000 mg | Freq: Once | RECTAL | Status: AC
  Administered 2024-07-03: 10 mg via RECTAL
  Filled 2024-07-03: qty 1

## 2024-07-03 MED ORDER — DOCUSATE SODIUM 100 MG PO CAPS: 100.0000 mg | ORAL_CAPSULE | Freq: Two times a day (BID) | ORAL | Status: AC

## 2024-07-03 NOTE — Progress Notes (Signed)
 Orthopaedic Trauma Progress Note  SUBJECTIVE: Resting comfortably in bed this AM. No acute events overnight. No family at bedside currently  OBJECTIVE:  Vitals:   07/03/24 0418 07/03/24 0739  BP: (!) 139/57 (!) 111/41  Pulse: 76 64  Resp: 20   Temp: 98 F (36.7 C) (!) 97.5 F (36.4 C)  SpO2: 97% 100%    Opiates Today (MME): Today's  total administered Morphine  Milligram Equivalents: 0 Opiates Yesterday (MME): Yesterday's total administered Morphine  Milligram Equivalents: 9  General: Sleeping comfortably in bed. Safety mittens in place Respiratory: No increased work of breathing.  Operative Extremity (left lower extremity): Aquacel dressing to the anterior hip is clean, dry, intact.  Notes some soreness with palpation over the lateral hip and proximal thigh as expected.  Less tender through the distal thigh, lower leg, ankle, foot.  Ankle DF/PF intact.  Endorses sensation light touch over all aspects of the foot. + DP pulse  IMAGING: Stable post op imaging left hip  LABS:  Results for orders placed or performed during the hospital encounter of 06/30/24 (from the past 24 hours)  Lab report - scanned     Status: None ()   Collection Time: 07/02/24 10:04 AM   Narrative   Ordered by an unspecified provider.  Lab report - scanned     Status: None   Collection Time: 07/02/24 10:04 AM   Narrative   Ordered by an unspecified provider.  Glucose, capillary     Status: Abnormal   Collection Time: 07/02/24 11:15 AM  Result Value Ref Range   Glucose-Capillary 191 (H) 70 - 99 mg/dL  Glucose, capillary     Status: Abnormal   Collection Time: 07/02/24  4:50 PM  Result Value Ref Range   Glucose-Capillary 244 (H) 70 - 99 mg/dL  Glucose, capillary     Status: Abnormal   Collection Time: 07/02/24  8:50 PM  Result Value Ref Range   Glucose-Capillary 187 (H) 70 - 99 mg/dL  CBC     Status: Abnormal   Collection Time: 07/03/24  5:09 AM  Result Value Ref Range   WBC 8.7 4.0 - 10.5 K/uL   RBC  3.35 (L) 4.22 - 5.81 MIL/uL   Hemoglobin 10.6 (L) 13.0 - 17.0 g/dL   HCT 67.5 (L) 60.9 - 47.9 %   MCV 96.7 80.0 - 100.0 fL   MCH 31.6 26.0 - 34.0 pg   MCHC 32.7 30.0 - 36.0 g/dL   RDW 87.4 88.4 - 84.4 %   Platelets 184 150 - 400 K/uL   nRBC 0.0 0.0 - 0.2 %  Basic metabolic panel     Status: Abnormal   Collection Time: 07/03/24  5:09 AM  Result Value Ref Range   Sodium 139 135 - 145 mmol/L   Potassium 4.5 3.5 - 5.1 mmol/L   Chloride 107 98 - 111 mmol/L   CO2 23 22 - 32 mmol/L   Glucose, Bld 166 (H) 70 - 99 mg/dL   BUN 33 (H) 8 - 23 mg/dL   Creatinine, Ser 8.63 (H) 0.61 - 1.24 mg/dL   Calcium  8.9 8.9 - 10.3 mg/dL   GFR, Estimated 51 (L) >60 mL/min   Anion gap 9 5 - 15  Glucose, capillary     Status: Abnormal   Collection Time: 07/03/24  7:40 AM  Result Value Ref Range   Glucose-Capillary 155 (H) 70 - 99 mg/dL    ASSESSMENT: Justin Salinas is a 84 y.o. male, 2 Days Post-Op s/p fall at [facility]  Procedures: LEFT HIP HEMIARTHROPLASTY  CV/Blood loss: Acute blood loss anemia, Hgb 10.6 this AM. Hemodynamically stable  PLAN: Weightbearing: WBAT LLE ROM: Unrestricted ROM.  No hip precautions needed Incisional and dressing care: Dressings left intact until follow-up  Showering: Okay to shower.  Aquacel dressing may get wet Orthopedic device(s): None  Pain management: Continue current regimen.  Limit narcotics as able VTE prophylaxis: Aspirin , SCDs ID:  Ancef  2gm post op completed Foley/Lines:  No foley, KVO IVFs Bone health: Vitamin D  level 27, will start on supplementation Dispo: PT/OT evaluation ongoing.  Ortho issues stable.  Okay for discharge back to SNF from ortho standpoint once cleared by medicine team and therapies  D/C recommendations: - Oxycodone , Tylenol , Robaxin  for pain control - Aspirin  325 mg daily x 30 days for DVT prophylaxis - Continue 2000 units Vit D supplementation daily  Follow - up plan: 2 weeks after d/c for wound check and repeat x-rays   Contact  information:  Franky Light MD, Lauraine Moores PA-C. After hours and holidays please check [email or web address] for group call information for [facility]   Lauraine PATRIC Moores, PA-C [phone] (office) [email or web address]

## 2024-07-03 NOTE — Care Management Important Message (Signed)
 Important Message  Patient Details  Name: Justin Salinas MRN: [id number] Date of Birth: December 28, 1940   Important Message Given:  Yes - Medicare IM     Claretta Deed 07/03/2024, 2:27 PM

## 2024-07-03 NOTE — Plan of Care (Signed)
   Problem: Education: Goal: Ability to describe self-care measures that may prevent or decrease complications (Diabetes Survival Skills Education) will improve Outcome: Progressing Goal: Individualized Educational Video(s) Outcome: Progressing   Problem: Coping: Goal: Ability to adjust to condition or change in health will improve Outcome: Progressing   Problem: Fluid Volume: Goal: Ability to maintain a balanced intake and output will improve Outcome: Progressing   Problem: Health Behavior/Discharge Planning: Goal: Ability to identify and utilize available resources and services will improve Outcome: Progressing Goal: Ability to manage health-related needs will improve Outcome: Progressing   Problem: Metabolic: Goal: Ability to maintain appropriate glucose levels will improve Outcome: Progressing   Problem: Nutritional: Goal: Maintenance of adequate nutrition will improve Outcome: Progressing Goal: Progress toward achieving an optimal weight will improve Outcome: Progressing   Problem: Skin Integrity: Goal: Risk for impaired skin integrity will decrease Outcome: Progressing   Problem: Tissue Perfusion: Goal: Adequacy of tissue perfusion will improve Outcome: Progressing   Problem: Education: Goal: Knowledge of General Education information will improve Description: Including pain rating scale, medication(s)/side effects and non-pharmacologic comfort measures Outcome: Progressing   Problem: Health Behavior/Discharge Planning: Goal: Ability to manage health-related needs will improve Outcome: Progressing   Problem: Clinical Measurements: Goal: Ability to maintain clinical measurements within normal limits will improve Outcome: Progressing Goal: Will remain free from infection Outcome: Progressing Goal: Diagnostic test results will improve Outcome: Progressing Goal: Respiratory complications will improve Outcome: Progressing Goal: Cardiovascular complication will  be avoided Outcome: Progressing   Problem: Activity: Goal: Risk for activity intolerance will decrease Outcome: Progressing   Problem: Nutrition: Goal: Adequate nutrition will be maintained Outcome: Progressing   Problem: Coping: Goal: Level of anxiety will decrease Outcome: Progressing   Problem: Elimination: Goal: Will not experience complications related to bowel motility Outcome: Progressing Goal: Will not experience complications related to urinary retention Outcome: Progressing   Problem: Pain Managment: Goal: General experience of comfort will improve and/or be controlled Outcome: Progressing   Problem: Safety: Goal: Ability to remain free from injury will improve Outcome: Progressing   Problem: Skin Integrity: Goal: Risk for impaired skin integrity will decrease Outcome: Progressing   Problem: Education: Goal: Knowledge of the prescribed therapeutic regimen will improve Outcome: Progressing Goal: Understanding of discharge needs will improve Outcome: Progressing Goal: Individualized Educational Video(s) Outcome: Progressing   Problem: Activity: Goal: Ability to avoid complications of mobility impairment will improve Outcome: Progressing Goal: Ability to tolerate increased activity will improve Outcome: Progressing   Problem: Clinical Measurements: Goal: Postoperative complications will be avoided or minimized Outcome: Progressing   Problem: Pain Management: Goal: Pain level will decrease with appropriate interventions Outcome: Progressing   Problem: Skin Integrity: Goal: Will show signs of wound healing Outcome: Progressing

## 2024-07-03 NOTE — Progress Notes (Signed)
 "  HD#3 SUBJECTIVE:  Patient Summary: Justin Salinas is an 84 year old male with a past medical history of dementia, CAD s/p CABG, hypertension, type 2 diabetes, CKD stage III, HFpEF, and recurrent falls who presents with altered mental status and recent falls with decreased left leg movement from [facility] and admitted for L femur fracture and surgical intervention now s/p left hip hemiarthroplasty 07/01/2024.   Overnight Events: no overnight events.   Interim History: Patient was assessed beside, awoken from sleep, resting comfortably, in no acute distress. Patient was oriented to self only and denied pain. Denied having had a bowel movement or passing gas.  OBJECTIVE:  Vital Signs: Vitals:   07/02/24 2049 07/03/24 0418 07/03/24 0439 07/03/24 0739  BP: (!) 143/64 (!) 139/57  (!) 111/41  Pulse: 71 76  64  Resp: 19 20    Temp: 98 F (36.7 C) 98 F (36.7 C)  (!) 97.5 F (36.4 C)  TempSrc:    Oral  SpO2: 97% 97%  100%  Weight:   74.7 kg   Height:       Supplemental O2: Room Air SpO2: 100 % O2 Flow Rate (L/min): 2 L/min  Filed Weights   07/01/24 0553 07/02/24 0423 07/03/24 0439  Weight: 75 kg 74.7 kg 74.7 kg     Intake/Output Summary (Last 24 hours) at 07/03/2024 9094 Last data filed at 07/02/2024 2200 Gross per 24 hour  Intake 840 ml  Output 650 ml  Net 190 ml   Net IO Since Admission: -660 mL [07/03/24 0905]  Physical Exam: Physical Exam Constitutional:      General: He is not in acute distress.    Appearance: He is not ill-appearing, toxic-appearing or diaphoretic.  Cardiovascular:     Rate and Rhythm: Normal rate and regular rhythm.     Heart sounds: Normal heart sounds. No murmur heard.    No friction rub. No gallop.  Pulmonary:     Effort: Pulmonary effort is normal.  Abdominal:     General: Bowel sounds are normal.     Palpations: Abdomen is soft.     Tenderness: There is no abdominal tenderness. There is no guarding.  Musculoskeletal:      Right lower leg: No edema.     Left lower leg: No edema.     Comments: Able to wiggle toes on command bilaterally.   Skin:    General: Skin is warm and dry.     Findings: Bruising (left hip, nontender, bandage overlying surgical closure) present.  Neurological:     Comments: Laying comfortably in bed, no acute distress, sleeping but awoke with voice. Following commands. Oriented to self only      Patient Lines/Drains/Airways Status     Active Line/Drains/Airways     Name Placement date Placement time Site Days   Wound 07/01/24 0924 Surgical Closed Surgical Incision Hip Right 07/01/24  0924  Hip  2   Wound 07/01/24 0924 Surgical Closed Surgical Incision Elbow Right 07/01/24  0924  Elbow  2   Wound 07/01/24 1048 Surgical Closed Surgical Incision Hip Left 07/01/24  1048  Hip  2            Pertinent labs and imaging:      Latest Ref Rng & Units 07/03/2024    5:09 AM 07/02/2024    5:09 AM 07/01/2024    5:40 AM  CBC  WBC 4.0 - 10.5 K/uL 8.7  11.4  11.6   Hemoglobin 13.0 - 17.0 g/dL 10.6  11.6  12.1   Hematocrit 39.0 - 52.0 % 32.4  36.3  36.5   Platelets 150 - 400 K/uL 184  217  199        Latest Ref Rng & Units 07/03/2024    5:09 AM 07/02/2024    5:09 AM 07/01/2024    5:40 AM  CMP  Glucose 70 - 99 mg/dL 833  719  729   BUN 8 - 23 mg/dL 33  38  40   Creatinine 0.61 - 1.24 mg/dL 8.63  8.44  8.52   Sodium 135 - 145 mmol/L 139  142  141   Potassium 3.5 - 5.1 mmol/L 4.5  5.0  4.6   Chloride 98 - 111 mmol/L 107  108  109   CO2 22 - 32 mmol/L 23  22  22    Calcium  8.9 - 10.3 mg/dL 8.9  9.1  9.4     No results found.   ASSESSMENT/PLAN:  Assessment: Principal Problem:   Femur fracture (HCC) Active Problems:   Controlled type 2 diabetes mellitus, with long-term current use of insulin  (HCC)   Essential hypertension   S/P CABG x 4   CKD stage 3 due to type 2 diabetes mellitus (HCC)   AKI (acute kidney injury)   Dementia with behavioral disturbance (HCC)   Acute  encephalopathy   SDH (subdural hematoma) (HCC)   Fall   Plan: Impacted left femoral neck fracture s/p left hip hemiarthroplasty 07/01/2024 Left subcapital hip fracture with mild displacement Fall Patient medically stable for discharge, awaiting placement for SNF which should be Monday.  - Appreciate orthopedic recs.  - weightbearing as tolerated to the left lower extremity.   - postoperative Ancef  for 3 doses (complete) - Aspirin  for DVT prophylaxis (325 mg) - mobilize with physical and Occupational Therapy.   - There is no hip precautions. - Tylenol  1000 mg q 8 hours  - morphine  2 mg q 2 hours PRN  - Oxycodone  2.5-5 mg q 4 hours PRN severe pain - Lidocaine  patch  - continue cholecalciferol  25 mcg daily   Difficulty swallowing  Appreciate SLP eval. Noted: xerostomia noted with small about of debris removed with swab. Prolonged oral phase with solids. Soft solid texture resulted in more efficient oral phase. Oral residue with solid and soft solid textures minimally improved with liquid wash.  - intermittent supervision with meals with mechanical soft solids with thin liquids and oral care following meals.  Acute encephalopathy with underlying dementia Recent subdural hematoma, resolved on CT 06/30/2024 - Delirium precautions - seroquel  25 mg nightly - sertraline  100 mg daily  - melatonin 3 mg nightly   AKI on CKD stage III - improved  Cr on last admission 1.41, Cr this admission 1.67; likely due to poor oral intake after recurrent falls and painful injury with hip fracture. Received IV fluids in ED. Cr 1.55 - continue to monitor   HTN CAD s/p CABG - amlodipine  10 mg daily  - Coreg  12.5 mg BID  - spironolactone  25 mg daily  - hydralazine  10 mg oral or 2 mg injection PRN for SBP >180 - Rosuvastatin  20 mg daily   Type 2 diabetes Hemoglobin A1c 6.9 on 06/13/2024.  He continues on basal bolus insulin . - 15 units insulin  glargine nightly - prandin  0.5 mg TID  - sensitive  sliding scale  Constipation  - Senna 8.6 mg BID - Miralax  17 g oral daily  - docustate sodium 100 mg BID - bisacodyl  suppository   Urinary Hesitancy  -  finasteride  5 mg daily  - tamsulosin  0.4 mg daily   Nutritional Products - Vit C and Vit D3  Respiratory Agents  - flonase   - claritin     Best Practice: Diet: Dysphagia 3  IVF: Fluids: none, Rate: None VTE: SCDs Start: 07/01/24 1206 Place TED hose Start: 07/01/24 1206 Code: DNR limited   Disposition planning: Therapy Recs: SNF, placement for Monday 07/06/2024 Family Contact: daughter, to be notified. DISPO: Anticipated discharge 07/06/2024  Signature:  Sallyanne Benuel [facility]  9:05 AM, 07/03/2024  On Call pager [phone]  "

## 2024-07-03 NOTE — Progress Notes (Signed)
 Pt daughter came to pick up patient back to [facility]. Discharge instruction given to pt daughter as well as DNR and medication prescription to be given the facility.

## 2024-07-03 NOTE — TOC Initial Note (Signed)
 Transition of Care [facility]) - Initial/Assessment Note    Patient Details  Name: Justin Salinas MRN: [id number] Date of Birth: [birth year]  [facility]) CM/SW Contact:    Luann SHAUNNA Cumming, LCSW Phone Number: 07/03/2024, 9:29 AM  Clinical Narrative:                  CSW spoke with pt's daughter regarding disposition. Pt lives at [facility] but was recently admitted to their SNF level rehab. He had multiple falls there resulting in current admission. Daughter would like for pt to return to their SNF for rehab. She had concerns about pt's dementia and it effecting his rehab at [facility]; she plans to discuss with SNF.   CSW confirmed bed with [facility]. SNF auth initiated.   Expected Discharge Plan: Skilled Nursing Facility Barriers to Discharge: Insurance Authorization, No SNF bed   Patient Goals and CMS Choice            Expected Discharge Plan and Services                                              Prior Living Arrangements/Services                       Activities of Daily Living   ADL Screening (condition at time of admission) Independently performs ADLs?: No Does the patient have a NEW difficulty with bathing/dressing/toileting/self-feeding that is expected to last >3 days?: Yes (Initiates electronic notice to provider for possible OT consult) Does the patient have a NEW difficulty with getting in/out of bed, walking, or climbing stairs that is expected to last >3 days?: Yes (Initiates electronic notice to provider for possible PT consult) Does the patient have a NEW difficulty with communication that is expected to last >3 days?: No Is the patient deaf or have difficulty hearing?: Yes Does the patient have difficulty seeing, even when wearing glasses/contacts?: Yes Does the patient have difficulty concentrating, remembering, or making decisions?: Yes  Permission Sought/Granted                  Emotional Assessment               Admission diagnosis:  Dehydration [E86.0] Femur fracture (HCC) [S72.90XA] Closed fracture of left hip, initial encounter (HCC) [S72.002A] Fall, initial encounter [W19.XXXA] Patient Active Problem List   Diagnosis Date Noted   Femur fracture (HCC) 06/30/2024   Fall 06/30/2024   SDH (subdural hematoma) (HCC) 06/18/2024   Acute encephalopathy 06/17/2024   Encephalopathy 06/13/2024   Nystagmus 06/13/2024   UTI (urinary tract infection) 06/13/2024   Acute kidney injury superimposed on stage 4 chronic kidney disease (HCC) 11/23/2023   Chronic diastolic CHF (congestive heart failure) (HCC) 11/23/2023   Macrocytic anemia 11/23/2023   Dementia with behavioral disturbance (HCC) 11/23/2023   AKI (acute kidney injury) 09/25/2023   Hyperglycemia 07/24/2022   Recurrent falls 07/23/2022   Cognitive decline 07/23/2022   Lumbar radiculopathy 09/23/2020   Atherosclerosis of abdominal aorta 02/26/2020   Status post placement of bone anchored hearing aid (BAHA) 05/09/2019   Asymmetric SNHL (sensorineural hearing loss) 04/02/2019   Iron deficiency anemia 10/22/2017   Type 2 diabetes mellitus with hyperglycemia (HCC) 01/03/2017   PVD (posterior vitreous detachment), left eye 02/16/2016   Vitreous syneresis of right eye 02/16/2016   Diastolic dysfunction  Hypertension    CKD stage 3 due to type 2 diabetes mellitus (HCC)    S/P CABG x 4 05/21/2014   Bilateral dry eyes 01/20/2014   Nuclear cataract, bilateral 12/10/2013   Astigmatism 01/11/2012   Hypermetropia 01/11/2012   Presbyopia 01/11/2012   Essential hypertension 12/01/2007   Carotid artery disease 12/01/2007   BPH (benign prostatic hyperplasia) 12/01/2007   Controlled type 2 diabetes mellitus, with long-term current use of insulin  (HCC) 11/28/2007   CAD (coronary artery disease) 11/28/2007   GERD 11/28/2007   PCP:  Kennyth Worth HERO, MD Pharmacy:   [facility] [address] [place]  [address] Phone: [phone] Fax: [phone]     Social Drivers of Health (SDOH) Social History: SDOH Screenings   Food Insecurity: Patient Unable To Answer (06/30/2024)  Housing: Unknown (06/30/2024)  Transportation Needs: Patient Unable To Answer (06/30/2024)  Utilities: Patient Unable To Answer (06/30/2024)  Depression (PHQ2-9): Low Risk (12/11/2022)  Financial Resource Strain: Low Risk (12/11/2022)  Physical Activity: Insufficiently Active (12/11/2022)  Social Connections: Patient Unable To Answer (06/30/2024)  Stress: No Stress Concern Present (12/11/2022)  Tobacco Use: High Risk (07/01/2024)   SDOH Interventions:     Readmission Risk Interventions     No data to display

## 2024-07-03 NOTE — Plan of Care (Signed)
" °  Problem: Education: Goal: Ability to describe self-care measures that may prevent or decrease complications (Diabetes Survival Skills Education) will improve Outcome: Progressing Goal: Individualized Educational Video(s) Outcome: Progressing   Problem: Coping: Goal: Ability to adjust to condition or change in health will improve Outcome: Progressing   Problem: Health Behavior/Discharge Planning: Goal: Ability to identify and utilize available resources and services will improve Outcome: Progressing Goal: Ability to manage health-related needs will improve Outcome: Progressing   Problem: Metabolic: Goal: Ability to maintain appropriate glucose levels will improve Outcome: Progressing   Problem: Education: Goal: Knowledge of General Education information will improve Description: Including pain rating scale, medication(s)/side effects and non-pharmacologic comfort measures Outcome: Progressing   Problem: Health Behavior/Discharge Planning: Goal: Ability to manage health-related needs will improve Outcome: Progressing   Problem: Elimination: Goal: Will not experience complications related to bowel motility Outcome: Progressing Goal: Will not experience complications related to urinary retention Outcome: Progressing   Problem: Education: Goal: Understanding of discharge needs will improve Outcome: Progressing   Problem: Clinical Measurements: Goal: Postoperative complications will be avoided or minimized Outcome: Progressing   "

## 2024-07-03 NOTE — Progress Notes (Signed)
 Report given to supervisor at [facility].

## 2024-07-03 NOTE — TOC Progression Note (Signed)
 Transition of Care [facility]) - Progression Note    Patient Details  Name: Justin Salinas MRN: [id number] Date of Birth: [birth year]  [facility]) CM/SW Contact  Luann SHAUNNA Cumming, [place] Phone Number: 07/03/2024, 9:31 AM  Clinical Narrative:     [facility] admissions notified CSW that one of their discharges fell through so they will no longer have a bed until Monday.   Expected Discharge Plan: Skilled Nursing Facility Barriers to Discharge: Insurance Authorization, No SNF bed                    Social Drivers of Health (SDOH) Interventions SDOH Screenings   Food Insecurity: Patient Unable To Answer (06/30/2024)  Housing: Unknown (06/30/2024)  Transportation Needs: Patient Unable To Answer (06/30/2024)  Utilities: Patient Unable To Answer (06/30/2024)  Depression (PHQ2-9): Low Risk (12/11/2022)  Financial Resource Strain: Low Risk (12/11/2022)  Physical Activity: Insufficiently Active (12/11/2022)  Social Connections: Patient Unable To Answer (06/30/2024)  Stress: No Stress Concern Present (12/11/2022)  Tobacco Use: High Risk (07/01/2024)    Readmission Risk Interventions     No data to display
